# Patient Record
Sex: Female | Born: 1945 | Race: White | Hispanic: No | Smoking: Current every day smoker
Health system: Southern US, Community
[De-identification: ages and names within clinical notes are randomized; demographics above are authoritative.]

## PROBLEM LIST (undated history)

## (undated) ENCOUNTER — Ambulatory Visit: Admission: EM | Payer: Medicare Other

## (undated) DIAGNOSIS — L409 Psoriasis, unspecified: Secondary | ICD-10-CM

## (undated) DIAGNOSIS — Z86010 Personal history of colonic polyps: Secondary | ICD-10-CM

## (undated) DIAGNOSIS — Z860101 Personal history of adenomatous and serrated colon polyps: Secondary | ICD-10-CM

## (undated) DIAGNOSIS — R519 Headache, unspecified: Secondary | ICD-10-CM

## (undated) DIAGNOSIS — M1990 Unspecified osteoarthritis, unspecified site: Secondary | ICD-10-CM

## (undated) DIAGNOSIS — I714 Abdominal aortic aneurysm, without rupture, unspecified: Secondary | ICD-10-CM

## (undated) DIAGNOSIS — R51 Headache: Secondary | ICD-10-CM

## (undated) DIAGNOSIS — E559 Vitamin D deficiency, unspecified: Secondary | ICD-10-CM

## (undated) DIAGNOSIS — J449 Chronic obstructive pulmonary disease, unspecified: Secondary | ICD-10-CM

## (undated) DIAGNOSIS — E785 Hyperlipidemia, unspecified: Secondary | ICD-10-CM

## (undated) DIAGNOSIS — I5032 Chronic diastolic (congestive) heart failure: Secondary | ICD-10-CM

## (undated) DIAGNOSIS — I10 Essential (primary) hypertension: Secondary | ICD-10-CM

## (undated) DIAGNOSIS — M5416 Radiculopathy, lumbar region: Secondary | ICD-10-CM

## (undated) DIAGNOSIS — K219 Gastro-esophageal reflux disease without esophagitis: Secondary | ICD-10-CM

## (undated) HISTORY — PX: FOOT SURGERY: SHX648

---

## 1974-07-20 HISTORY — PX: OTHER SURGICAL HISTORY: SHX169

## 1998-07-20 HISTORY — PX: APPENDECTOMY: SHX54

## 2009-09-25 DIAGNOSIS — R519 Headache, unspecified: Secondary | ICD-10-CM | POA: Insufficient documentation

## 2009-09-25 DIAGNOSIS — I10 Essential (primary) hypertension: Secondary | ICD-10-CM | POA: Insufficient documentation

## 2009-09-25 DIAGNOSIS — L409 Psoriasis, unspecified: Secondary | ICD-10-CM | POA: Insufficient documentation

## 2009-09-25 DIAGNOSIS — F17200 Nicotine dependence, unspecified, uncomplicated: Secondary | ICD-10-CM | POA: Insufficient documentation

## 2009-09-25 DIAGNOSIS — M1990 Unspecified osteoarthritis, unspecified site: Secondary | ICD-10-CM | POA: Insufficient documentation

## 2009-10-11 DIAGNOSIS — E785 Hyperlipidemia, unspecified: Secondary | ICD-10-CM | POA: Insufficient documentation

## 2009-10-11 DIAGNOSIS — R6889 Other general symptoms and signs: Secondary | ICD-10-CM | POA: Insufficient documentation

## 2010-09-01 ENCOUNTER — Ambulatory Visit: Payer: Self-pay | Admitting: Family Medicine

## 2012-01-11 ENCOUNTER — Ambulatory Visit: Payer: Self-pay | Admitting: Family Medicine

## 2012-01-11 LAB — HM MAMMOGRAPHY

## 2013-08-21 LAB — HEMOGLOBIN A1C: Hgb A1c MFr Bld: 5.5 % (ref 4.0–6.0)

## 2013-12-21 ENCOUNTER — Ambulatory Visit: Payer: Self-pay | Admitting: Hematology and Oncology

## 2013-12-21 LAB — CBC CANCER CENTER
BASOS PCT: 0.3 %
Basophil #: 0 x10 3/mm (ref 0.0–0.1)
EOS ABS: 0.1 x10 3/mm (ref 0.0–0.7)
Eosinophil %: 1.7 %
HCT: 46.3 % (ref 35.0–47.0)
HGB: 16 g/dL (ref 12.0–16.0)
Lymphocyte #: 1 x10 3/mm (ref 1.0–3.6)
Lymphocyte %: 12.9 %
MCH: 31.9 pg (ref 26.0–34.0)
MCHC: 34.7 g/dL (ref 32.0–36.0)
MCV: 92 fL (ref 80–100)
MONOS PCT: 5 %
Monocyte #: 0.4 x10 3/mm (ref 0.2–0.9)
Neutrophil #: 6.1 x10 3/mm (ref 1.4–6.5)
Neutrophil %: 80.1 %
Platelet: 152 x10 3/mm (ref 150–440)
RBC: 5.03 10*6/uL (ref 3.80–5.20)
RDW: 13.3 % (ref 11.5–14.5)
WBC: 7.6 x10 3/mm (ref 3.6–11.0)

## 2013-12-26 ENCOUNTER — Ambulatory Visit: Payer: Self-pay | Admitting: Family Medicine

## 2014-01-17 ENCOUNTER — Ambulatory Visit: Payer: Self-pay | Admitting: Hematology and Oncology

## 2014-02-13 ENCOUNTER — Ambulatory Visit: Payer: Self-pay | Admitting: Specialist

## 2014-10-12 LAB — BASIC METABOLIC PANEL
BUN: 8 mg/dL (ref 4–21)
Creatinine: 0.9 mg/dL (ref 0.5–1.1)
GLUCOSE: 81 mg/dL
Potassium: 4.1 mmol/L (ref 3.4–5.3)
Sodium: 140 mmol/L (ref 137–147)

## 2014-10-12 LAB — HEPATIC FUNCTION PANEL
ALT: 25 U/L (ref 7–35)
AST: 31 U/L (ref 13–35)

## 2014-10-12 LAB — CBC AND DIFFERENTIAL
HEMATOCRIT: 49 % — AB (ref 36–46)
Hemoglobin: 16.7 g/dL — AB (ref 12.0–16.0)
PLATELETS: 201 10*3/uL (ref 150–399)
WBC: 6.6 10*3/mL

## 2014-10-12 LAB — LIPID PANEL
CHOLESTEROL: 193 mg/dL (ref 0–200)
HDL: 53 mg/dL (ref 35–70)
LDL Cholesterol: 116 mg/dL
TRIGLYCERIDES: 119 mg/dL (ref 40–160)

## 2015-01-10 ENCOUNTER — Other Ambulatory Visit: Payer: Self-pay | Admitting: Family Medicine

## 2015-01-11 NOTE — Telephone Encounter (Signed)
Advise patient the Tramadol can no longer be sent to the pharmacy electronically. Will need to come by the office to pick up the written prescription at the front desk.

## 2015-01-12 ENCOUNTER — Other Ambulatory Visit: Payer: Self-pay | Admitting: Family Medicine

## 2015-01-14 NOTE — Telephone Encounter (Signed)
Patient advised as directed below. Patient verbalized understanding.  

## 2015-02-05 ENCOUNTER — Other Ambulatory Visit: Payer: Self-pay | Admitting: Family Medicine

## 2015-02-06 ENCOUNTER — Telehealth: Payer: Self-pay

## 2015-02-06 NOTE — Telephone Encounter (Signed)
Advised pt that her Tramadol prescription is ready for her to pick up, and advised her that she is due for an annual exam before the year end. She verbalized fully understanding and stated that she was going to pick up the Tramadol prescription either today or tomorrow.

## 2015-02-06 NOTE — Telephone Encounter (Signed)
-----   Message from Margo Common, [place] sent at 02/05/2015  6:01 PM EDT ----- Regarding: Medication Refill Contact: [phone] Advise patient Tramadol prescription can't be sent electronically. Written prescription will be at the front desk for pick up and she is due for annual wellness exam before the end of the year.

## 2015-03-07 ENCOUNTER — Other Ambulatory Visit: Payer: Self-pay | Admitting: Family Medicine

## 2015-03-12 ENCOUNTER — Other Ambulatory Visit: Payer: Self-pay | Admitting: Family Medicine

## 2015-03-12 NOTE — Telephone Encounter (Signed)
Rx called in to pharmacy. 

## 2015-03-12 NOTE — Telephone Encounter (Signed)
Dennis patient 

## 2015-03-12 NOTE — Telephone Encounter (Signed)
Please call in tramadol.  

## 2015-04-07 ENCOUNTER — Other Ambulatory Visit: Payer: Self-pay | Admitting: Family Medicine

## 2015-04-07 DIAGNOSIS — M1990 Unspecified osteoarthritis, unspecified site: Secondary | ICD-10-CM

## 2015-04-08 NOTE — Telephone Encounter (Signed)
Ok to call in rx.  Thanks.  

## 2015-05-06 ENCOUNTER — Other Ambulatory Visit: Payer: Self-pay | Admitting: Family Medicine

## 2015-05-06 DIAGNOSIS — M1990 Unspecified osteoarthritis, unspecified site: Secondary | ICD-10-CM

## 2015-05-07 NOTE — Telephone Encounter (Signed)
Last OV 10/08/2014  Thanks,   -[name]

## 2015-05-07 NOTE — Telephone Encounter (Signed)
Printed, please fax or call in to pharmacy. Thank you.   

## 2015-06-06 ENCOUNTER — Other Ambulatory Visit: Payer: Self-pay | Admitting: Family Medicine

## 2015-06-06 DIAGNOSIS — M1990 Unspecified osteoarthritis, unspecified site: Secondary | ICD-10-CM

## 2015-06-06 NOTE — Telephone Encounter (Signed)
Printed, please fax or call in to pharmacy. Thank you.   

## 2015-06-18 DIAGNOSIS — L988 Other specified disorders of the skin and subcutaneous tissue: Secondary | ICD-10-CM | POA: Insufficient documentation

## 2015-06-18 DIAGNOSIS — K219 Gastro-esophageal reflux disease without esophagitis: Secondary | ICD-10-CM | POA: Insufficient documentation

## 2015-06-18 DIAGNOSIS — R739 Hyperglycemia, unspecified: Secondary | ICD-10-CM | POA: Insufficient documentation

## 2015-06-18 DIAGNOSIS — M79604 Pain in right leg: Secondary | ICD-10-CM | POA: Insufficient documentation

## 2015-06-18 DIAGNOSIS — R748 Abnormal levels of other serum enzymes: Secondary | ICD-10-CM | POA: Insufficient documentation

## 2015-06-18 DIAGNOSIS — R0789 Other chest pain: Secondary | ICD-10-CM | POA: Insufficient documentation

## 2015-06-18 DIAGNOSIS — E559 Vitamin D deficiency, unspecified: Secondary | ICD-10-CM | POA: Insufficient documentation

## 2015-06-18 DIAGNOSIS — J349 Unspecified disorder of nose and nasal sinuses: Secondary | ICD-10-CM | POA: Insufficient documentation

## 2015-06-18 DIAGNOSIS — G2581 Restless legs syndrome: Secondary | ICD-10-CM | POA: Insufficient documentation

## 2015-06-18 DIAGNOSIS — D582 Other hemoglobinopathies: Secondary | ICD-10-CM | POA: Insufficient documentation

## 2015-06-18 DIAGNOSIS — R5383 Other fatigue: Secondary | ICD-10-CM | POA: Insufficient documentation

## 2015-06-24 ENCOUNTER — Encounter: Payer: Self-pay | Admitting: Family Medicine

## 2015-06-24 ENCOUNTER — Ambulatory Visit (INDEPENDENT_AMBULATORY_CARE_PROVIDER_SITE_OTHER): Payer: PPO | Admitting: Family Medicine

## 2015-06-24 ENCOUNTER — Telehealth: Payer: Self-pay | Admitting: Family Medicine

## 2015-06-24 VITALS — BP 132/80 | HR 72 | Temp 97.4°F | Resp 16 | Ht 63.0 in | Wt 179.0 lb

## 2015-06-24 DIAGNOSIS — D692 Other nonthrombocytopenic purpura: Secondary | ICD-10-CM | POA: Diagnosis not present

## 2015-06-24 DIAGNOSIS — J449 Chronic obstructive pulmonary disease, unspecified: Secondary | ICD-10-CM | POA: Insufficient documentation

## 2015-06-24 DIAGNOSIS — M1990 Unspecified osteoarthritis, unspecified site: Secondary | ICD-10-CM

## 2015-06-24 DIAGNOSIS — I10 Essential (primary) hypertension: Secondary | ICD-10-CM | POA: Diagnosis not present

## 2015-06-24 DIAGNOSIS — Z23 Encounter for immunization: Secondary | ICD-10-CM

## 2015-06-24 DIAGNOSIS — J441 Chronic obstructive pulmonary disease with (acute) exacerbation: Secondary | ICD-10-CM | POA: Insufficient documentation

## 2015-06-24 DIAGNOSIS — Z13820 Encounter for screening for osteoporosis: Secondary | ICD-10-CM | POA: Diagnosis not present

## 2015-06-24 DIAGNOSIS — Z720 Tobacco use: Secondary | ICD-10-CM | POA: Insufficient documentation

## 2015-06-24 DIAGNOSIS — F17200 Nicotine dependence, unspecified, uncomplicated: Secondary | ICD-10-CM | POA: Diagnosis not present

## 2015-06-24 DIAGNOSIS — Z1239 Encounter for other screening for malignant neoplasm of breast: Secondary | ICD-10-CM

## 2015-06-24 DIAGNOSIS — R748 Abnormal levels of other serum enzymes: Secondary | ICD-10-CM | POA: Diagnosis not present

## 2015-06-24 DIAGNOSIS — Z1211 Encounter for screening for malignant neoplasm of colon: Secondary | ICD-10-CM | POA: Diagnosis not present

## 2015-06-24 DIAGNOSIS — Z0000 Encounter for general adult medical examination without abnormal findings: Secondary | ICD-10-CM

## 2015-06-24 DIAGNOSIS — L409 Psoriasis, unspecified: Secondary | ICD-10-CM

## 2015-06-24 MED ORDER — BETAMETHASONE DIPROPIONATE 0.05 % EX OINT: TOPICAL_OINTMENT | Freq: Two times a day (BID) | CUTANEOUS | Status: DC

## 2015-06-24 MED ORDER — GABAPENTIN 300 MG PO CAPS: 300.0000 mg | ORAL_CAPSULE | Freq: Two times a day (BID) | ORAL | Status: DC

## 2015-06-24 NOTE — Telephone Encounter (Signed)
Pt called saying she was in this after noon to see Dr. Venia Minks and talked to [name] about a handicap form being filled out.  She left the office without it.  She wants to know if Dr. Venia Minks could fill one out so she can come back and pik it up tomorrow.    Her call back is  [phone]  Thank sTeri

## 2015-06-24 NOTE — Progress Notes (Signed)
Patient ID: Mariah [name], female   DOB: 03/13/1946, 69 y.o.   MRN: [id number]         Patient: Mariah [name], Female    DOB: 1945/08/29, 69 y.o.   MRN: [id number] Visit Date: 06/24/2015  Today's Provider: Margarita Rana, MD   No chief complaint on file.  Subjective:    Annual wellness visit [name] is a 69 y.o. female. She feels fairly well.  Having trouble with right hip/leg pain.   She reports exercising. She reports she is sleeping well. She is due for several screening tests. Still smokes.     Also having a lot of trouble with arthritis and hip pain. It is impacting her ADLS.  Does have chronic pain. Some days worse than others.   Difficulty ambulating.  Psoriasis also worsening.   Has not seen rheumatologist, has see orthopedics.  Also, has abnormal labs that she did  Not follow up on. Takes her medication without any difficulty.       Review of Systems  Constitutional: Negative.   HENT: Positive for dental problem, postnasal drip and sinus pressure. Negative for congestion, drooling, ear discharge, ear pain, facial swelling, hearing loss, mouth sores, nosebleeds, rhinorrhea, sneezing, sore throat, tinnitus, trouble swallowing and voice change.   Eyes: Negative.   Respiratory: Negative.   Cardiovascular: Negative.   Gastrointestinal: Negative.   Endocrine: Negative.   Genitourinary: Negative.   Musculoskeletal: Negative.   Skin: Positive for rash (Pt has a history of Psorasis). Negative for color change, pallor and wound.  Allergic/Immunologic: Negative.   Neurological: Negative.   Hematological: Negative.   Psychiatric/Behavioral: Negative.     Social History   Social History  . Marital Status: Single    Spouse Name: N/A  . Number of Children: N/A  . Years of Education: N/A   Occupational History  . Not on file.   Social History Main Topics  . Smoking status: Current Every Day Smoker  . Smokeless tobacco: Never Used  . Alcohol Use: Yes  . Drug  Use: No  . Sexual Activity: Not on file   Other Topics Concern  . Not on file   Social History Narrative    Patient Active Problem List   Diagnosis Date Noted  . Chronic obstructive pulmonary disease ([place]) 06/24/2015  . Current tobacco use 06/24/2015  . Atypical chest pain 06/18/2015  . Abnormal serum level of alkaline phosphatase 06/18/2015  . Elevated hemoglobin ([place]) 06/18/2015  . Fatigue 06/18/2015  . Fistula 06/18/2015  . Blood glucose elevated 06/18/2015  . Gastro-esophageal reflux disease without esophagitis 06/18/2015  . Restless leg 06/18/2015  . Leg pain, right 06/18/2015  . 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 Avitaminosis D 06/18/2015  . Mechanical and motor problems with internal organs 10/11/2009  . Hypercholesteremia 10/11/2009  . Arthritis, degenerative 09/25/2009  . Essential (primary) hypertension 09/25/2009  . Cephalalgia 09/25/2009  . Psoriasis 09/25/2009  . Compulsive tobacco user syndrome 09/25/2009    Past Surgical History  Procedure Laterality Date  . Appendectomy  2000  . Foot surgery Bilateral   . 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    Previous Medications   ASPIRIN 81 MG TABLET       BETAMETHASONE DIPROPIONATE (DIPROLENE) 0.05 % OINTMENT    APPLY TWICE A DAY  CHOLECALCIFEROL (VITAMIN D) 2000 UNITS TABLET    VITAMIN D, 2000UNIT (Oral Tablet)  1 Every Day for 0 days  Quantity: 0.00;  Refills: 0   Ordered :19-Mar-2010  [name] ;  Started 11-Oct-2009 Active Comments: DX: 268.9   DIPHENHYDRAMINE (SOMINEX) 25 MG TABLET    Take by mouth.   HYDROCHLOROTHIAZIDE (HYDRODIURIL) 12.5 MG TABLET    Take by mouth.   MELOXICAM (MOBIC) 15 MG TABLET    Take 15 mg by mouth daily. with food   METOPROLOL SUCCINATE (TOPROL-XL) 50 MG 24 HR TABLET    Take by mouth.    PANTOPRAZOLE (PROTONIX) 20 MG TABLET    TAKE 1 TABLET BY MOUTH DAILY   PSYLLIUM 28.3 % POWD    Take by mouth.   SIMVASTATIN (ZOCOR) 10 MG TABLET    Take by mouth.   TRAMADOL (ULTRAM) 50 MG TABLET    TAKE 1 TABLET BY MOUTH 4 TIMES A DAY AS NEEDED    Patient Care Team: Margarita Rana, MD as PCP - General (Family Medicine)     Objective:   Vitals: BP 132/80 mmHg  Pulse 72  Temp(Src) 97.4 F (36.3 C) (Oral)  Resp 16  Ht 5\' 3"  (1.6 m)  Wt 179 lb (81.194 kg)  BMI 31.72 kg/m2  Physical Exam  Constitutional: She is oriented to person, place, and time. She appears well-developed and well-nourished.  HENT:  Head: Normocephalic and atraumatic.  Right Ear: Tympanic membrane, external ear and ear canal normal.  Left Ear: Tympanic membrane, external ear and ear canal normal.  Nose: Nose normal.  Mouth/Throat: Uvula is midline, oropharynx is clear and moist and mucous membranes are normal.  Eyes: Conjunctivae, EOM and lids are normal. Pupils are equal, round, and reactive to light. Lids are everted and swept, no foreign bodies found.  Neck: Trachea normal and normal range of motion. Carotid bruit is not present.  Cardiovascular: Normal rate, regular rhythm and normal heart sounds.   Pulmonary/Chest: Effort normal and breath sounds normal. Right breast exhibits no inverted nipple, no mass, no nipple discharge, no skin change and no tenderness. Left breast exhibits no inverted nipple, no mass, no nipple discharge, no skin change and no tenderness. Breasts are symmetrical.  Abdominal: Soft. Normal appearance, normal aorta and bowel sounds are normal. There is no tenderness.  Musculoskeletal: Normal range of motion.  Lymphadenopathy:    She has no cervical adenopathy.    She has no axillary adenopathy.  Neurological: She is alert and oriented to person, place, and time.  Skin: Skin is warm, dry and intact. Ecchymosis and rash noted. There is erythema.  Psychiatric: She has a normal mood and  affect. Her speech is normal and behavior is normal. Judgment and thought content normal. Cognition and memory are normal.    Activities of Daily Living In your present state of health, do you have any difficulty performing the following activities: 06/24/2015  Hearing? N  Vision? N  Difficulty concentrating or making decisions? N  Walking or climbing stairs? N  Dressing or bathing? N  Doing errands, shopping? N    Fall Risk Assessment Fall Risk  06/24/2015  Falls in the past year? No     Depression Screen PHQ 2/9 Scores 06/24/2015  PHQ - 2 Score 0    Cognitive Testing - 6-CIT  Correct? Score   What year is it? yes 0 0 or 4  What month is it? yes 0 0 or 3  Memorize:    Pia Mau,  [birth year],  [place],      What time is it? (within 1 hour) yes 0 0 or 3  Count backwards from 20 yes 0 0, 2, or 4  Name the months of the year yes 0 0, 2, or 4  Repeat name & address above yes 3 0, 2, 4, 6, 8, or 10       TOTAL SCORE  3/28   Interpretation:  Normal  Normal (0-7) Abnormal (8-28)       Assessment & Plan:     Annual Wellness Visit  Reviewed patient's Family Medical History Reviewed and updated list of patient's medical providers Assessment of cognitive impairment was done Assessed patient's functional ability Established a written schedule for health screening [place] Completed and Reviewed  Exercise Activities and Dietary recommendations Goals    None      Immunization History  Administered Date(s) Administered  . Pneumococcal Polysaccharide-23 03/26/2011  . Tdap 03/26/2011    Health Maintenance  Topic Date Due  . Hepatitis C Screening  01/17/1946  . COLONOSCOPY  08/22/1994  . ZOSTAVAX  08/22/2004  . DEXA SCAN  08/22/2009  . PNA vac Low Risk Adult (2 of 2 - PCV13) 03/25/2012  . MAMMOGRAM  01/10/2014  . INFLUENZA VACCINE  02/18/2015  . TETANUS/TDAP  03/25/2021      Discussed health benefits of physical activity, and  encouraged her to engage in regular exercise appropriate for her age and condition.   1. Medicare annual wellness visit, subsequent As above.    2. Need for vaccination with 13-polyvalent pneumococcal conjugate vaccine Given today.  - Pneumococcal conjugate vaccine 13-valent  3. Flu vaccine need Given today.  - Flu vaccine HIGH DOSE PF (Fluzone High dose)  4. Osteoporosis screening Will schedule.  - DG Bone Density; Future  5. Psoriasis Refill medication.  Rheumatology referral to assess if having symptoms of psoriatic arthritis.   - betamethasone dipropionate (DIPROLENE) 0.05 % ointment; Apply topically 2 (two) times daily.  Dispense: 45 g; Refill: 5 - Ambulatory referral to Rheumatology  6. Breast cancer screening Will call and schedule.   - MM Digital Screening; Future  7. 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    8. Elevated alkaline phosphatase level Recheck labs.  - Alkaline Phosphatase, Isoenzymes - Comprehensive metabolic panel  9. Compulsive tobacco user syndrome Increased hemoglobin. Will recheck.   - CBC with Differential/Platelet  10. Essential (primary) hypertension Will check labs.  - TSH  11. Colon cancer screening Will refer.   - Ambulatory referral to Gastroenterology  12. Osteoarthritis, unspecified osteoarthritis type, unspecified site Worsening. Will try adding medication to see if improves symptoms.    - gabapentin (NEURONTIN) 300 MG capsule; Take 1 capsule (300 mg total) by mouth 2 (two) times daily.  Dispense: 60 capsule; Refill: 3     Patient was seen and examined by Jerrell Belfast, MD, and note scribed by Ashley Royalty, CMA.  I have reviewed the document for accuracy and completeness and I agree with above. Jerrell Belfast, MD   Margarita Rana, MD   ------------------------------------------------------------------------------------------------------------

## 2015-06-24 NOTE — Telephone Encounter (Signed)
Signed and put in box. Thanks.

## 2015-06-25 ENCOUNTER — Encounter: Payer: Self-pay | Admitting: Family Medicine

## 2015-06-25 NOTE — Telephone Encounter (Signed)
Advised patient as below.  

## 2015-07-02 ENCOUNTER — Other Ambulatory Visit: Payer: Self-pay | Admitting: Family Medicine

## 2015-07-02 DIAGNOSIS — M1990 Unspecified osteoarthritis, unspecified site: Secondary | ICD-10-CM

## 2015-07-02 NOTE — Telephone Encounter (Signed)
Printed, please fax or call in to pharmacy. Thank you.   

## 2015-07-04 LAB — CBC WITH DIFFERENTIAL/PLATELET
BASOS: 0 %
Basophils Absolute: 0 10*3/uL (ref 0.0–0.2)
EOS (ABSOLUTE): 0.2 10*3/uL (ref 0.0–0.4)
Eos: 3 %
HEMOGLOBIN: 15.4 g/dL (ref 11.1–15.9)
Hematocrit: 44.6 % (ref 34.0–46.6)
IMMATURE GRANS (ABS): 0 10*3/uL (ref 0.0–0.1)
IMMATURE GRANULOCYTES: 0 %
LYMPHS ABS: 1.5 10*3/uL (ref 0.7–3.1)
LYMPHS: 22 %
MCH: 31.4 pg (ref 26.6–33.0)
MCHC: 34.5 g/dL (ref 31.5–35.7)
MCV: 91 fL (ref 79–97)
Monocytes Absolute: 0.4 10*3/uL (ref 0.1–0.9)
Monocytes: 6 %
NEUTROS ABS: 4.8 10*3/uL (ref 1.4–7.0)
Neutrophils: 69 %
Platelets: 191 10*3/uL (ref 150–379)
RBC: 4.91 x10E6/uL (ref 3.77–5.28)
RDW: 13 % (ref 12.3–15.4)
WBC: 6.9 10*3/uL (ref 3.4–10.8)

## 2015-07-04 LAB — COMPREHENSIVE METABOLIC PANEL
ALBUMIN: 4.3 g/dL (ref 3.5–4.8)
ALK PHOS: 119 IU/L — AB (ref 39–117)
ALT: 14 IU/L (ref 0–32)
AST: 23 IU/L (ref 0–40)
Albumin/Globulin Ratio: 2 (ref 1.1–2.5)
BILIRUBIN TOTAL: 0.8 mg/dL (ref 0.0–1.2)
BUN / CREAT RATIO: 10 — AB (ref 11–26)
BUN: 10 mg/dL (ref 8–27)
CO2: 24 mmol/L (ref 18–29)
CREATININE: 0.96 mg/dL (ref 0.57–1.00)
Calcium: 9.2 mg/dL (ref 8.7–10.3)
Chloride: 93 mmol/L — ABNORMAL LOW (ref 96–106)
GFR calc non Af Amer: 60 mL/min/{1.73_m2} (ref >59)
GFR, EST AFRICAN AMERICAN: 69 mL/min/{1.73_m2} (ref >59)
GLOBULIN, TOTAL: 2.1 g/dL (ref 1.5–4.5)
Glucose: 94 mg/dL (ref 65–99)
Potassium: 4.1 mmol/L (ref 3.5–5.2)
SODIUM: 136 mmol/L (ref 134–144)
TOTAL PROTEIN: 6.4 g/dL (ref 6.0–8.5)

## 2015-07-04 LAB — TSH: TSH: 2.64 u[IU]/mL (ref 0.450–4.500)

## 2015-07-04 LAB — ALKALINE PHOSPHATASE, ISOENZYMES
BONE FRACTION: 25 % (ref 14–68)
INTESTINAL FRAC.: 0 % (ref 0–18)
LIVER FRACTION: 75 % (ref 18–85)

## 2015-07-05 ENCOUNTER — Telehealth: Payer: Self-pay

## 2015-07-05 NOTE — Telephone Encounter (Signed)
-----   Message from Margarita Rana, MD sent at 07/04/2015  5:09 PM EST ----- Labs stable. Please notify patient.  Thanks.

## 2015-07-05 NOTE — Telephone Encounter (Signed)
Pt advised.   Thanks,   -Jerame Hedding  

## 2015-07-08 ENCOUNTER — Ambulatory Visit
Admission: RE | Admit: 2015-07-08 | Discharge: 2015-07-08 | Disposition: A | Discharge status: Home / Self Care | Payer: PPO | Source: Ambulatory Visit | Attending: Family Medicine | Admitting: Family Medicine

## 2015-07-08 DIAGNOSIS — Z780 Asymptomatic menopausal state: Secondary | ICD-10-CM | POA: Diagnosis not present

## 2015-07-08 DIAGNOSIS — Z13820 Encounter for screening for osteoporosis: Secondary | ICD-10-CM | POA: Diagnosis not present

## 2015-07-08 DIAGNOSIS — Z1231 Encounter for screening mammogram for malignant neoplasm of breast: Secondary | ICD-10-CM | POA: Insufficient documentation

## 2015-07-08 DIAGNOSIS — M810 Age-related osteoporosis without current pathological fracture: Secondary | ICD-10-CM | POA: Insufficient documentation

## 2015-07-08 DIAGNOSIS — Z1239 Encounter for other screening for malignant neoplasm of breast: Secondary | ICD-10-CM

## 2015-07-09 ENCOUNTER — Telehealth: Payer: Self-pay

## 2015-07-09 NOTE — Telephone Encounter (Signed)
-----   Message from Margarita Rana, MD sent at 07/09/2015  1:58 PM EST ----- Patient with osteoporosis. OV to discuss treatment options if patient would like to consider medication. Thanks.

## 2015-07-09 NOTE — Telephone Encounter (Signed)
LMTCB 07/09/2015  Thanks,   -[name]

## 2015-07-10 NOTE — Telephone Encounter (Signed)
LMTCB

## 2015-07-10 NOTE — Telephone Encounter (Signed)
Pt called to speak with [name] and would like a call back on her cell phone. Cell # is listed in contact info section of the message. Thanks TNP

## 2015-07-10 NOTE — Telephone Encounter (Signed)
Pt is returning call.  CB#[phone]/MW

## 2015-07-11 NOTE — Telephone Encounter (Signed)
Pt advised; apt made for 08/05/2015  Thanks,   -[name]

## 2015-08-05 ENCOUNTER — Ambulatory Visit (INDEPENDENT_AMBULATORY_CARE_PROVIDER_SITE_OTHER): Payer: PPO | Admitting: Family Medicine

## 2015-08-05 ENCOUNTER — Encounter: Payer: Self-pay | Admitting: Family Medicine

## 2015-08-05 VITALS — BP 122/80 | HR 64 | Temp 98.2°F | Resp 16 | Wt 182.0 lb

## 2015-08-05 DIAGNOSIS — K5900 Constipation, unspecified: Secondary | ICD-10-CM

## 2015-08-05 DIAGNOSIS — M1990 Unspecified osteoarthritis, unspecified site: Secondary | ICD-10-CM

## 2015-08-05 DIAGNOSIS — M810 Age-related osteoporosis without current pathological fracture: Secondary | ICD-10-CM

## 2015-08-05 MED ORDER — ALENDRONATE SODIUM 70 MG PO TABS: 70.0000 mg | ORAL_TABLET | ORAL | Status: DC

## 2015-08-05 MED ORDER — TRAMADOL HCL 50 MG PO TABS: ORAL_TABLET | ORAL | Status: DC

## 2015-08-05 MED ORDER — POLYETHYLENE GLYCOL 3350 17 GM/SCOOP PO POWD
17.0000 g | Freq: Two times a day (BID) | ORAL | Status: DC | PRN
Start: 2015-08-05 — End: 2020-05-26

## 2015-08-05 NOTE — Progress Notes (Signed)
Patient ID: Mariah [name], female   DOB: [birth year], 70 y.o.   MRN: [id number]         Patient: Mariah [name] Female    DOB: 07-14-1946   71 y.o.   MRN: [id number] Visit Date: 08/05/2015  Today's Provider: Margarita Rana, MD   No chief complaint on file.  Subjective:    HPI   Osteoporosis: Patient complains of osteoporosis. She was diagnosed with osteoporosis by bone density scan in 07/08/2015. 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   She is not currently being treated with calcium and vitamin D supplementation.  She is not currently being treated with bisphosphonates  Osteoporosis Risk Factors  Nonmodifiable Personal Hx of fracture as an adult: no Hx of fracture in first-degree relative: no Caucasian race: yes Advanced age: yes Female sex: yes Dementia: no Poor health/frailty: no  Potentially modifiable: Tobacco use: yes  Low body weight (<127 lbs): no Estrogen deficiency  early menopause (age <45) or bilateral ovariectomy: Yes  prolonged premenopausal amenorrhea (>1 yr): no Low calcium intake (lifelong): not applicable Alcoholism: no Recurrent falls: no Inadequate physical activity: no  Current calcium and Vit D intake: Dietary sources: Yes Supplements: Pt take 2,000 units of vitamin D a day.  Frax Score for hip is 9.3%; Major Fracture: 20%       No Known Allergies Previous Medications   ASPIRIN 81 MG TABLET       BETAMETHASONE DIPROPIONATE (DIPROLENE) 0.05 % OINTMENT    Apply topically 2 (two) times daily.   CHOLECALCIFEROL (VITAMIN D) 2000 UNITS TABLET    VITAMIN D, 2000UNIT (Oral Tablet)  1 Every Day for 0 days  Quantity: 0.00;  Refills: 0   Ordered :19-Mar-2010  [name] ;  Started 11-Oct-2009 Active Comments: DX: 268.9   DIPHENHYDRAMINE (SOMINEX) 25 MG TABLET    Take by mouth.   GABAPENTIN (NEURONTIN) 300 MG CAPSULE    Take 1 capsule (300 mg total) by mouth 2  (two) times daily.   HYDROCHLOROTHIAZIDE (HYDRODIURIL) 12.5 MG TABLET    Take by mouth.   MELOXICAM (MOBIC) 15 MG TABLET    Take 15 mg by mouth daily. with food   METOPROLOL SUCCINATE (TOPROL-XL) 50 MG 24 HR TABLET    Take by mouth.   PANTOPRAZOLE (PROTONIX) 20 MG TABLET    TAKE 1 TABLET BY MOUTH DAILY   PSYLLIUM 28.3 % POWD    Take by mouth.   SIMVASTATIN (ZOCOR) 10 MG TABLET    Take by mouth.    Review of Systems  Gastrointestinal: Positive for constipation. Negative for nausea, vomiting, abdominal pain, diarrhea, blood in stool, abdominal distention, anal bleeding and rectal pain.  Musculoskeletal: Negative.     Social History  Substance Use Topics  . Smoking status: Current Every Day Smoker  . Smokeless tobacco: Never Used  . Alcohol Use: Yes   Objective:   BP 122/80 mmHg  Pulse 64  Temp(Src) 98.2 F (36.8 C) (Oral)  Resp 16  Wt 182 lb (82.555 kg)  Physical Exam  Constitutional: She is oriented to person, place, and time. She appears well-developed and well-nourished.  Cardiovascular: Normal rate and regular rhythm.   Pulmonary/Chest: Effort normal and breath sounds normal.  Neurological: She is alert and oriented to person, place, and time.  Psychiatric: She has a normal mood and affect. Her behavior is normal. Judgment and thought content normal.      Assessment & Plan:  1. Osteoarthritis, unspecified osteoarthritis type, unspecified site Will refill medication.  Stable.  - traMADol (ULTRAM) 50 MG tablet; TAKE 1 TABLET 4 TIMES A DAY AS NEEDED  Dispense: 100 tablet; Refill: 5  2. Osteoporosis New problem.  Discussed risks and benefits of medication.  Will start medication.  Does not need dental implants. No kidney issues. Understands importance of how to take it. Recheck bone density in 2 years. Call if any side effects.   - alendronate (FOSAMAX) 70 MG tablet; Take 1 tablet (70 mg total) by mouth every 7 (seven) days. Take with a full glass of water on an empty  stomach.  Dispense: 12 tablet; Refill: 4  3. Constipation, unspecified constipation type New problem. Will try Miralax and see if improves.    - polyethylene glycol powder (GLYCOLAX/MIRALAX) powder; Take 17 g by mouth 2 (two) times daily as needed.  Dispense: 3350 g; Refill: 1  Patient was seen and examined by Jerrell Belfast, MD, and note scribed by Ashley Royalty, [place].  I have reviewed the document for accuracy and completeness and I agree with above. - Jerrell Belfast, MD     Margarita Rana, MD  [facility]

## 2015-08-12 DIAGNOSIS — M25559 Pain in unspecified hip: Secondary | ICD-10-CM | POA: Insufficient documentation

## 2015-08-12 DIAGNOSIS — Z716 Tobacco abuse counseling: Secondary | ICD-10-CM | POA: Diagnosis not present

## 2015-08-12 DIAGNOSIS — Z79899 Other long term (current) drug therapy: Secondary | ICD-10-CM | POA: Diagnosis not present

## 2015-08-12 DIAGNOSIS — M810 Age-related osteoporosis without current pathological fracture: Secondary | ICD-10-CM | POA: Insufficient documentation

## 2015-08-12 DIAGNOSIS — G8929 Other chronic pain: Secondary | ICD-10-CM | POA: Diagnosis not present

## 2015-08-12 DIAGNOSIS — M25551 Pain in right hip: Secondary | ICD-10-CM | POA: Diagnosis not present

## 2015-08-12 DIAGNOSIS — M2550 Pain in unspecified joint: Secondary | ICD-10-CM | POA: Diagnosis not present

## 2015-08-12 DIAGNOSIS — L409 Psoriasis, unspecified: Secondary | ICD-10-CM | POA: Diagnosis not present

## 2015-08-12 DIAGNOSIS — M5136 Other intervertebral disc degeneration, lumbar region: Secondary | ICD-10-CM | POA: Diagnosis not present

## 2015-08-12 DIAGNOSIS — R918 Other nonspecific abnormal finding of lung field: Secondary | ICD-10-CM | POA: Diagnosis not present

## 2015-08-12 DIAGNOSIS — M545 Low back pain: Secondary | ICD-10-CM | POA: Diagnosis not present

## 2015-08-12 DIAGNOSIS — M1611 Unilateral primary osteoarthritis, right hip: Secondary | ICD-10-CM | POA: Diagnosis not present

## 2015-08-12 DIAGNOSIS — J449 Chronic obstructive pulmonary disease, unspecified: Secondary | ICD-10-CM | POA: Diagnosis not present

## 2015-08-12 LAB — HM HEPATITIS C SCREENING LAB: HM Hepatitis Screen: NEGATIVE

## 2015-08-28 ENCOUNTER — Encounter: Payer: Self-pay | Admitting: Family Medicine

## 2015-09-30 ENCOUNTER — Other Ambulatory Visit: Payer: Self-pay | Admitting: Family Medicine

## 2015-10-07 DIAGNOSIS — L4050 Arthropathic psoriasis, unspecified: Secondary | ICD-10-CM | POA: Diagnosis not present

## 2015-10-07 DIAGNOSIS — M810 Age-related osteoporosis without current pathological fracture: Secondary | ICD-10-CM | POA: Diagnosis not present

## 2015-10-07 DIAGNOSIS — M169 Osteoarthritis of hip, unspecified: Secondary | ICD-10-CM | POA: Insufficient documentation

## 2015-10-07 DIAGNOSIS — M1611 Unilateral primary osteoarthritis, right hip: Secondary | ICD-10-CM | POA: Diagnosis not present

## 2015-10-07 DIAGNOSIS — L409 Psoriasis, unspecified: Secondary | ICD-10-CM | POA: Diagnosis not present

## 2015-10-24 DIAGNOSIS — M5416 Radiculopathy, lumbar region: Secondary | ICD-10-CM | POA: Diagnosis not present

## 2015-10-24 DIAGNOSIS — M1611 Unilateral primary osteoarthritis, right hip: Secondary | ICD-10-CM | POA: Diagnosis not present

## 2015-10-25 DIAGNOSIS — M5410 Radiculopathy, site unspecified: Secondary | ICD-10-CM | POA: Insufficient documentation

## 2015-11-06 ENCOUNTER — Other Ambulatory Visit: Payer: Self-pay | Admitting: Family Medicine

## 2015-11-11 ENCOUNTER — Ambulatory Visit (INDEPENDENT_AMBULATORY_CARE_PROVIDER_SITE_OTHER): Payer: PPO | Admitting: Family Medicine

## 2015-11-11 ENCOUNTER — Encounter: Payer: Self-pay | Admitting: Family Medicine

## 2015-11-11 VITALS — BP 116/76 | HR 88 | Temp 98.6°F | Resp 16 | Wt 185.0 lb

## 2015-11-11 DIAGNOSIS — R197 Diarrhea, unspecified: Secondary | ICD-10-CM

## 2015-11-11 NOTE — Progress Notes (Signed)
Patient ID: Mariah [name], female   DOB: Sep 10, 1945, 70 y.o.   MRN: [id number]         Patient: Mariah [name] Female    DOB: [birth year]   70 y.o.   MRN: [id number] Visit Date: 11/11/2015  Today's Provider: Margarita Rana, MD   Chief Complaint  Patient presents with  . Diarrhea   Subjective:    Diarrhea  This is a new problem. The current episode started 1 to 4 weeks ago. The patient states that diarrhea awakens (Only occasionally) her from sleep. Pertinent negatives include no abdominal pain, bloating, chills, coughing, fever, headaches, myalgias, vomiting or weight loss. Nothing aggravates the symptoms. There are no known risk factors. She has tried anti-motility drug for the symptoms. The treatment provided no relief.       No Known Allergies Previous Medications   ALENDRONATE (FOSAMAX) 70 MG TABLET    Take 1 tablet (70 mg total) by mouth every 7 (seven) days. Take with a full glass of water on an empty stomach.   ASPIRIN 81 MG TABLET       BETAMETHASONE DIPROPIONATE (DIPROLENE) 0.05 % OINTMENT    Apply topically 2 (two) times daily.   CHOLECALCIFEROL (VITAMIN D) 2000 UNITS TABLET    VITAMIN D, 2000UNIT (Oral Tablet)  1 Every Day for 0 days  Quantity: 0.00;  Refills: 0   Ordered :19-Mar-2010  Mariah [name] ;  Started 11-Oct-2009 Active Comments: DX: 268.9   DIPHENHYDRAMINE (SOMINEX) 25 MG TABLET    Take by mouth.   GABAPENTIN (NEURONTIN) 300 MG CAPSULE    TAKE 1 CAPSULE (300 MG TOTAL) BY MOUTH 2 (TWO) TIMES DAILY.   HYDROCHLOROTHIAZIDE (HYDRODIURIL) 12.5 MG TABLET    TAKE 1 TABLET EVERY DAY   MELOXICAM (MOBIC) 15 MG TABLET    Take 15 mg by mouth daily. with food   METOPROLOL SUCCINATE (TOPROL-XL) 50 MG 24 HR TABLET    TAKE 1 TABLET EVERY DAY   PANTOPRAZOLE (PROTONIX) 20 MG TABLET    TAKE 1 TABLET BY MOUTH DAILY   POLYETHYLENE GLYCOL POWDER (GLYCOLAX/MIRALAX) POWDER    Take 17 g by mouth 2 (two) times daily as needed.   PSYLLIUM 28.3 % POWD    Take by mouth.   SIMVASTATIN (ZOCOR) 10 MG TABLET    TAKE 1 TABLET AT BEDTIME   TRAMADOL (ULTRAM) 50 MG TABLET    TAKE 1 TABLET 4 TIMES A DAY AS NEEDED    Review of Systems  Constitutional: Negative.  Negative for fever, chills and weight loss.  Respiratory: Negative.  Negative for cough.   Cardiovascular: Negative.   Gastrointestinal: Positive for diarrhea. Negative for vomiting, abdominal pain and bloating.  Musculoskeletal: Negative.  Negative for myalgias.  Neurological: Negative for dizziness, light-headedness and headaches.    Social History  Substance Use Topics  . Smoking status: Current Every Day Smoker  . Smokeless tobacco: Never Used  . Alcohol Use: Yes   Objective:   BP 116/76 mmHg  Pulse 88  Temp(Src) 98.6 F (37 C) (Oral)  Resp 16  Wt 185 lb (83.915 kg)  Physical Exam  Constitutional: She is oriented to person, place, and time. She appears well-developed and well-nourished.  Cardiovascular: Normal rate, regular rhythm and normal heart sounds.   Pulmonary/Chest: Effort normal and breath sounds normal.  Abdominal: Soft. Bowel sounds are normal.  Neurological: She is alert and oriented to person, place, and time.  Skin: Skin is warm and dry.  Psychiatric: She has a normal mood and  affect. Her behavior is normal. Judgment and thought content normal.      Assessment & Plan:     1. Diarrhea, unspecified type New problem. Improved some today.  Going to start new medication for her psoriasis. Will check labs and stool cultures. Call if symptoms change.  - CBC with Differential/Platelet - Comprehensive metabolic panel - Stool C-Diff Toxin Assay - Stool Culture - Stool, WBC/Lactoferrin     Patient was seen and examined by Jerrell Belfast, MD, and note scribed by Ashley Royalty, CMA.   I have reviewed the document for accuracy and completeness and I agree with above. - Jerrell Belfast, MD   Margarita Rana, MD  [facility]

## 2015-11-12 DIAGNOSIS — R197 Diarrhea, unspecified: Secondary | ICD-10-CM | POA: Diagnosis not present

## 2015-11-13 ENCOUNTER — Telehealth: Payer: Self-pay

## 2015-11-13 DIAGNOSIS — R197 Diarrhea, unspecified: Secondary | ICD-10-CM | POA: Diagnosis not present

## 2015-11-13 LAB — COMPREHENSIVE METABOLIC PANEL
ALK PHOS: 130 IU/L — AB (ref 39–117)
ALT: 14 IU/L (ref 0–32)
AST: 21 IU/L (ref 0–40)
Albumin/Globulin Ratio: 2.1 (ref 1.2–2.2)
Albumin: 4.1 g/dL (ref 3.5–4.8)
BILIRUBIN TOTAL: 0.5 mg/dL (ref 0.0–1.2)
BUN/Creatinine Ratio: 8 — ABNORMAL LOW (ref 12–28)
BUN: 8 mg/dL (ref 8–27)
CHLORIDE: 97 mmol/L (ref 96–106)
CO2: 26 mmol/L (ref 18–29)
Calcium: 9 mg/dL (ref 8.7–10.3)
Creatinine, Ser: 0.98 mg/dL (ref 0.57–1.00)
GFR calc Af Amer: 67 mL/min/{1.73_m2} (ref >59)
GFR calc non Af Amer: 58 mL/min/{1.73_m2} — ABNORMAL LOW (ref >59)
GLUCOSE: 78 mg/dL (ref 65–99)
Globulin, Total: 2 g/dL (ref 1.5–4.5)
Potassium: 4 mmol/L (ref 3.5–5.2)
Sodium: 141 mmol/L (ref 134–144)
Total Protein: 6.1 g/dL (ref 6.0–8.5)

## 2015-11-13 LAB — CBC WITH DIFFERENTIAL/PLATELET
BASOS ABS: 0 10*3/uL (ref 0.0–0.2)
BASOS: 0 %
EOS (ABSOLUTE): 0.1 10*3/uL (ref 0.0–0.4)
Eos: 2 %
Hematocrit: 43.1 % (ref 34.0–46.6)
Hemoglobin: 14.3 g/dL (ref 11.1–15.9)
IMMATURE GRANS (ABS): 0 10*3/uL (ref 0.0–0.1)
Immature Granulocytes: 0 %
LYMPHS: 16 %
Lymphocytes Absolute: 1 10*3/uL (ref 0.7–3.1)
MCH: 30 pg (ref 26.6–33.0)
MCHC: 33.2 g/dL (ref 31.5–35.7)
MCV: 90 fL (ref 79–97)
MONOCYTES: 7 %
Monocytes Absolute: 0.4 10*3/uL (ref 0.1–0.9)
NEUTROS PCT: 75 %
Neutrophils Absolute: 4.5 10*3/uL (ref 1.4–7.0)
PLATELETS: 189 10*3/uL (ref 150–379)
RBC: 4.77 x10E6/uL (ref 3.77–5.28)
RDW: 13.3 % (ref 12.3–15.4)
WBC: 6 10*3/uL (ref 3.4–10.8)

## 2015-11-13 NOTE — Telephone Encounter (Signed)
Pt advised.  She says she is going to drop off the stool cultures today.  She reports that she feels the same.   Thanks,   -[name]

## 2015-11-13 NOTE — Telephone Encounter (Signed)
-----   Message from Margarita Rana, MD sent at 11/13/2015 11:00 AM EDT ----- Labs stable. Waiting on stool cultures. Please see how patient is doing. Thanks.

## 2015-11-15 LAB — CLOSTRIDIUM DIFFICILE EIA: C DIFFICILE TOXINS A+ B, EIA: NEGATIVE

## 2015-11-18 ENCOUNTER — Telehealth: Payer: Self-pay

## 2015-11-18 DIAGNOSIS — R197 Diarrhea, unspecified: Secondary | ICD-10-CM

## 2015-11-18 LAB — STOOL CULTURE: E coli, Shiga toxin Assay: NEGATIVE

## 2015-11-18 NOTE — Telephone Encounter (Signed)
Advised patient as below. Patient reports that she will consider GI referral. Order has been placed. Please schedule patient on a Monday afternoon if possible. Thanks!

## 2015-11-18 NOTE — Telephone Encounter (Signed)
Would probably wait on new medication until diarrhea improved even more .  Recommend GI referral to evaluate and treat if patient is agreeable. Thanks.

## 2015-11-18 NOTE — Telephone Encounter (Signed)
Advised patient as below. Patient reports that she is still having diarrhea. She reports that it may be slightly better, but she is still having symptoms. Patient also wanted to remind you that she still has not taken "the other medication" yet. She reports that we were going to wait until we received these results before starting.

## 2015-11-18 NOTE — Telephone Encounter (Signed)
Left message to call back  

## 2015-11-18 NOTE — Telephone Encounter (Signed)
-----   Message from Margarita Rana, MD sent at 11/18/2015  9:30 AM EDT ----- Stool cultures negative.  Please see how patient is doing. Thanks.

## 2015-11-19 LAB — FECAL LACTOFERRIN, QUANT: Lactoferrin, Fecal, Quant.: 4.09 ug/mL(g) (ref 0.00–7.24)

## 2015-11-25 ENCOUNTER — Other Ambulatory Visit (HOSPITAL_COMMUNITY): Payer: Self-pay | Admitting: Physician Assistant

## 2015-11-25 DIAGNOSIS — R197 Diarrhea, unspecified: Secondary | ICD-10-CM

## 2015-11-25 DIAGNOSIS — R748 Abnormal levels of other serum enzymes: Secondary | ICD-10-CM

## 2015-11-28 ENCOUNTER — Ambulatory Visit
Admission: RE | Admit: 2015-11-28 | Discharge: 2015-11-28 | Disposition: A | Discharge status: Home / Self Care | Payer: PPO | Source: Ambulatory Visit | Attending: Physician Assistant | Admitting: Physician Assistant

## 2015-11-28 DIAGNOSIS — R918 Other nonspecific abnormal finding of lung field: Secondary | ICD-10-CM | POA: Diagnosis not present

## 2015-11-28 DIAGNOSIS — R197 Diarrhea, unspecified: Secondary | ICD-10-CM | POA: Insufficient documentation

## 2015-11-28 DIAGNOSIS — K8020 Calculus of gallbladder without cholecystitis without obstruction: Secondary | ICD-10-CM | POA: Insufficient documentation

## 2015-11-28 DIAGNOSIS — I714 Abdominal aortic aneurysm, without rupture: Secondary | ICD-10-CM | POA: Diagnosis not present

## 2015-11-28 DIAGNOSIS — R748 Abnormal levels of other serum enzymes: Secondary | ICD-10-CM | POA: Insufficient documentation

## 2015-12-09 DIAGNOSIS — R748 Abnormal levels of other serum enzymes: Secondary | ICD-10-CM | POA: Diagnosis not present

## 2015-12-09 DIAGNOSIS — R938 Abnormal findings on diagnostic imaging of other specified body structures: Secondary | ICD-10-CM | POA: Diagnosis not present

## 2015-12-17 DIAGNOSIS — M167 Other unilateral secondary osteoarthritis of hip: Secondary | ICD-10-CM | POA: Diagnosis not present

## 2015-12-17 DIAGNOSIS — L4050 Arthropathic psoriasis, unspecified: Secondary | ICD-10-CM | POA: Diagnosis not present

## 2015-12-17 DIAGNOSIS — M25551 Pain in right hip: Secondary | ICD-10-CM | POA: Diagnosis not present

## 2015-12-30 DIAGNOSIS — Z79899 Other long term (current) drug therapy: Secondary | ICD-10-CM | POA: Diagnosis not present

## 2016-01-01 ENCOUNTER — Other Ambulatory Visit: Payer: Self-pay | Admitting: Family Medicine

## 2016-01-06 DIAGNOSIS — L409 Psoriasis, unspecified: Secondary | ICD-10-CM | POA: Diagnosis not present

## 2016-01-06 DIAGNOSIS — M1611 Unilateral primary osteoarthritis, right hip: Secondary | ICD-10-CM | POA: Diagnosis not present

## 2016-01-06 DIAGNOSIS — M810 Age-related osteoporosis without current pathological fracture: Secondary | ICD-10-CM | POA: Diagnosis not present

## 2016-01-06 DIAGNOSIS — L4050 Arthropathic psoriasis, unspecified: Secondary | ICD-10-CM | POA: Diagnosis not present

## 2016-01-06 DIAGNOSIS — Z79899 Other long term (current) drug therapy: Secondary | ICD-10-CM | POA: Diagnosis not present

## 2016-01-13 ENCOUNTER — Encounter: Payer: Self-pay | Admitting: Family Medicine

## 2016-01-13 ENCOUNTER — Ambulatory Visit (INDEPENDENT_AMBULATORY_CARE_PROVIDER_SITE_OTHER): Payer: PPO | Admitting: Family Medicine

## 2016-01-13 ENCOUNTER — Other Ambulatory Visit: Payer: Self-pay

## 2016-01-13 VITALS — BP 118/80 | HR 80 | Temp 97.8°F | Resp 16 | Wt 174.0 lb

## 2016-01-13 DIAGNOSIS — Q8909 Congenital malformations of spleen: Secondary | ICD-10-CM | POA: Insufficient documentation

## 2016-01-13 DIAGNOSIS — I714 Abdominal aortic aneurysm, without rupture, unspecified: Secondary | ICD-10-CM | POA: Insufficient documentation

## 2016-01-13 DIAGNOSIS — N2889 Other specified disorders of kidney and ureter: Secondary | ICD-10-CM

## 2016-01-13 DIAGNOSIS — I10 Essential (primary) hypertension: Secondary | ICD-10-CM

## 2016-01-13 NOTE — Assessment & Plan Note (Signed)
Needs renal protocol MRI or CT after surgery in July.

## 2016-01-13 NOTE — Progress Notes (Signed)
Patient: Mariah [name] Female    DOB: 1945-08-09   70 y.o.   MRN: [id number] Visit Date: 01/13/2016  Today's Provider: Margarita Rana, MD   Chief Complaint  Patient presents with  . Hypertension  . Pre-op Exam   Subjective:    HPI      Hypertension, follow-up:  BP Readings from Last 3 Encounters:  01/13/16 118/80  11/11/15 116/76  08/05/15 122/80    She was last seen for hypertension 6 months ago.  BP at that visit was 132/80. Management since that visit includes no changes. She reports excellent compliance with treatment. She is not having side effects.  She is not exercising. (Needs hip replacement.) She is adherent to low salt diet.   Outside blood pressures are not being checked. She is experiencing fatigue and lower extremity edema.  Patient denies chest pain, chest pressure/discomfort, claudication, dyspnea, exertional chest pressure/discomfort, irregular heart beat, near-syncope, orthopnea, palpitations and syncope.   Cardiovascular risk factors include advanced age (older than 50 for men, 70 for women), dyslipidemia, hypertension and smoking/ tobacco exposure.    Weight trend: stable Wt Readings from Last 3 Encounters:  01/13/16 174 lb (78.926 kg)  11/11/15 185 lb (83.915 kg)  08/05/15 182 lb (82.555 kg)    Current diet: in general, a "healthy" diet    ------------------------------------------------------------------------ Preoperative Exam Pt is having a right total hip replacement on 01/27/2016, performed by Dr. Marry Guan. Pt taking ASA 81 mg BID. Pt never had reaction to anesthesia.  Will get labs and EKG at pre-op.     No Known Allergies Current Meds  Medication Sig  . Apremilast (OTEZLA) 30 MG TABS Take 1 tablet by mouth 2 (two) times daily.  [name] aspirin 81 MG tablet   . betamethasone dipropionate (DIPROLENE) 0.05 % ointment Apply topically 2 (two) times daily.  . Cholecalciferol (VITAMIN D) 2000 UNITS tablet VITAMIN D, 2000UNIT (Oral  Tablet)  1 Every Day for 0 days  Quantity: 0.00;  Refills: 0   Ordered :19-Mar-2010  [name] ;  Started 11-Oct-2009 Active Comments: DX: 268.9  . colestipol (COLESTID) 1 g tablet Take 2 tablets by mouth 2 (two) times daily.  . diphenhydrAMINE (SOMINEX) 25 MG tablet Take by mouth.  . gabapentin (NEURONTIN) 300 MG capsule TAKE 1 CAPSULE (300 MG TOTAL) BY MOUTH 2 (TWO) TIMES DAILY.  . hydrochlorothiazide (HYDRODIURIL) 12.5 MG tablet TAKE 1 TABLET EVERY DAY  . meloxicam (MOBIC) 15 MG tablet Take 15 mg by mouth daily. with food  . metoprolol succinate (TOPROL-XL) 50 MG 24 hr tablet TAKE 1 TABLET EVERY DAY  . pantoprazole (PROTONIX) 20 MG tablet TAKE 1 TABLET BY MOUTH ONCE DAILY  . polyethylene glycol powder (GLYCOLAX/MIRALAX) powder Take 17 g by mouth 2 (two) times daily as needed.  . simvastatin (ZOCOR) 10 MG tablet TAKE 1 TABLET AT BEDTIME  . traMADol (ULTRAM) 50 MG tablet TAKE 1 TABLET 4 TIMES A DAY AS NEEDED    Review of Systems  Constitutional: Positive for fatigue. Negative for unexpected weight change (has lost weight, but due to liquid diet after having 4 teeth pulled).  HENT: Positive for rhinorrhea (AR).   Respiratory: Positive for cough (AR). Negative for shortness of breath and wheezing.   Cardiovascular: Positive for leg swelling (left ankle). Negative for chest pain and palpitations.    Social History  Substance Use Topics  . Smoking status: Current Every Day Smoker -- 1.00 packs/day for 0 years  . Smokeless tobacco: Never Used  .  Alcohol Use: Yes     Comment: occasionally   Objective:   BP 118/80 mmHg  Pulse 80  Temp(Src) 97.8 F (36.6 C) (Oral)  Resp 16  Wt 174 lb (78.926 kg)  Physical Exam  Constitutional: She is oriented to person, place, and time. She appears well-developed and well-nourished.  Neurological: She is alert and oriented to person, place, and time.  Psychiatric: She has a normal mood and affect. Her behavior is normal.      Assessment &  Plan:     1. Abdominal aortic aneurysm (AAA) without rupture ([place]) Found in ABD [place] on 11/28/2015. Will need 6 month FU [place] for this problem.  2. Renal mass Needs renal protocol MRI or CT after surgery in July. FU with Dr. Caryn Section in 2 months for further work up.Wishes to proceed with hip secondary to pain first.     3. Essential (primary) hypertension Stable. Continue current medications and plan of care.    Patient seen and examined by Jerrell Belfast, MD, and note scribed by Renaldo Fiddler, CMA.  I have reviewed the document for accuracy and completeness and I agree with above. - Jerrell Belfast, MD   Margarita Rana, MD  [facility]

## 2016-01-13 NOTE — Assessment & Plan Note (Addendum)
Found on ABD [place] on 11/28/2015. Will need 6 month FU in November, 2017.

## 2016-01-15 ENCOUNTER — Encounter
Admission: RE | Admit: 2016-01-15 | Discharge: 2016-01-15 | Disposition: A | Discharge status: Home / Self Care | Payer: PPO | Source: Ambulatory Visit | Attending: Orthopedic Surgery | Admitting: Orthopedic Surgery

## 2016-01-15 ENCOUNTER — Inpatient Hospital Stay: Admission: RE | Admit: 2016-01-15 | Payer: PPO | Source: Ambulatory Visit

## 2016-01-15 DIAGNOSIS — Z01810 Encounter for preprocedural cardiovascular examination: Secondary | ICD-10-CM | POA: Diagnosis not present

## 2016-01-15 DIAGNOSIS — Z01812 Encounter for preprocedural laboratory examination: Secondary | ICD-10-CM | POA: Insufficient documentation

## 2016-01-15 DIAGNOSIS — I10 Essential (primary) hypertension: Secondary | ICD-10-CM | POA: Diagnosis not present

## 2016-01-15 HISTORY — DX: Essential (primary) hypertension: I10

## 2016-01-15 HISTORY — DX: Unspecified osteoarthritis, unspecified site: M19.90

## 2016-01-15 LAB — CBC
HEMATOCRIT: 43.5 % (ref 35.0–47.0)
HEMOGLOBIN: 15.4 g/dL (ref 12.0–16.0)
MCH: 30.6 pg (ref 26.0–34.0)
MCHC: 35.4 g/dL (ref 32.0–36.0)
MCV: 86.6 fL (ref 80.0–100.0)
Platelets: 153 10*3/uL (ref 150–440)
RBC: 5.03 MIL/uL (ref 3.80–5.20)
RDW: 13.7 % (ref 11.5–14.5)
WBC: 5.7 10*3/uL (ref 3.6–11.0)

## 2016-01-15 LAB — URINALYSIS COMPLETE WITH MICROSCOPIC (ARMC ONLY)
BILIRUBIN URINE: NEGATIVE
Bacteria, UA: NONE SEEN
GLUCOSE, UA: NEGATIVE mg/dL
Hgb urine dipstick: NEGATIVE
KETONES UR: NEGATIVE mg/dL
Leukocytes, UA: NEGATIVE
Nitrite: NEGATIVE
Protein, ur: NEGATIVE mg/dL
RBC / HPF: NONE SEEN RBC/hpf (ref 0–5)
SPECIFIC GRAVITY, URINE: 1.002 — AB (ref 1.005–1.030)
pH: 7 (ref 5.0–8.0)

## 2016-01-15 LAB — SURGICAL PCR SCREEN
MRSA, PCR: NEGATIVE
Staphylococcus aureus: NEGATIVE

## 2016-01-15 LAB — COMPREHENSIVE METABOLIC PANEL
ALK PHOS: 124 U/L (ref 38–126)
ALT: 18 U/L (ref 14–54)
AST: 26 U/L (ref 15–41)
Albumin: 4.4 g/dL (ref 3.5–5.0)
Anion gap: 9 (ref 5–15)
BUN: 8 mg/dL (ref 6–20)
CALCIUM: 9.5 mg/dL (ref 8.9–10.3)
CO2: 29 mmol/L (ref 22–32)
Chloride: 97 mmol/L — ABNORMAL LOW (ref 101–111)
Creatinine, Ser: 0.86 mg/dL (ref 0.44–1.00)
GFR calc Af Amer: >60 mL/min (ref >60)
GFR calc non Af Amer: >60 mL/min (ref >60)
GLUCOSE: 103 mg/dL — AB (ref 65–99)
Potassium: 3.7 mmol/L (ref 3.5–5.1)
SODIUM: 135 mmol/L (ref 135–145)
Total Bilirubin: 0.9 mg/dL (ref 0.3–1.2)
Total Protein: 7.4 g/dL (ref 6.5–8.1)

## 2016-01-15 LAB — PROTIME-INR
INR: 0.91
Prothrombin Time: 12.5 seconds (ref 11.4–15.0)

## 2016-01-15 LAB — APTT: aPTT: 30 seconds (ref 24–36)

## 2016-01-15 LAB — SEDIMENTATION RATE: Sed Rate: 5 mm/hr (ref 0–30)

## 2016-01-15 NOTE — Pre-Procedure Instructions (Signed)
Spoke with pharmacist Nicki Reaper here at [facility] regarding pt's [place] Nail and see if she needs to stop it prior to hip replacement.  Nicki Reaper researched this and reports pt does not need to stop this med prior to hip replacement.

## 2016-01-15 NOTE — Pre-Procedure Instructions (Signed)
MEDICAL CLEARANCE REQUEST/ EKG, AS INSTRUCTED BY DR Yuma FAXED TO [place]

## 2016-01-15 NOTE — Patient Instructions (Signed)
  Your procedure is scheduled on: Monday January 27, 2016. Report to Same Day Surgery. To find out your arrival time please call [phone] between 1PM - 3PM on Friday January 24, 2016 .  Remember: Instructions that are not followed completely may result in serious medical risk, up to and including death, or upon the discretion of your surgeon and anesthesiologist your surgery may need to be rescheduled.    _x___ 1. Do not eat food or drink liquids after midnight. No gum chewing or hard candies.     _x___ 2. No Alcohol for 24 hours before or after surgery.   ____ 3. Bring all medications with you on the day of surgery if instructed.    __x__ 4. Notify your doctor if there is any change in your medical condition     (cold, fever, infections).     Do not wear jewelry, make-up, hairpins, clips or nail polish.  Do not wear lotions, powders, or perfumes. You may wear deodorant.  Do not shave 48 hours prior to surgery. Men may shave face and neck.  Do not bring valuables to the hospital.    [facility] is not responsible for any belongings or valuables.               Contacts, dentures or bridgework may not be worn into surgery.  Leave your suitcase in the car. After surgery it may be brought to your room.  For patients admitted to the hospital, discharge time is determined by your treatment team.   Patients discharged the day of surgery will not be allowed to drive home.    Please read over the following fact sheets that you were given:   [facility] Preparing for Surgery  __x__ Take these medicines the morning of surgery with A SIP OF WATER:    1. metoprolol succinate (TOPROL-XL)  2. pantoprazole (PROTONIX) take an extra dose at bedtime on 01/26/16  3. gabapentin (NEURONTIN)   ____ Fleet Enema (as directed)   __x__ Use CHG Soap as directed on instruction sheet  ____ Use inhalers on the day of surgery and bring to hospital day of surgery  ____ Stop metformin 2 days prior to  surgery    ____ Take 1/2 of usual insulin dose the night before surgery and none on the morning of surgery.   __x__ Stop aspirin 7 days prior to surgery per Dr. Marry Guan.  _x_ Stop Anti-inflammatories such as: meloxicam (MOBIC),  Advil, Aleve, Ibuprofen, Motrin, Naproxen,  Naprosyn, Goodies powders or aspirin products.   _x_ Stop supplements:Cholecalciferol (VITAMIN D3)  until after surgery.    ____ Bring C-Pap to the hospital.

## 2016-01-16 LAB — URINE CULTURE: Special Requests: NORMAL

## 2016-01-16 LAB — TYPE AND SCREEN
ABO/RH(D): B NEG
ANTIBODY SCREEN: NEGATIVE

## 2016-01-22 ENCOUNTER — Other Ambulatory Visit: Payer: Self-pay | Admitting: Family Medicine

## 2016-01-22 DIAGNOSIS — R9431 Abnormal electrocardiogram [ECG] [EKG]: Secondary | ICD-10-CM

## 2016-01-22 DIAGNOSIS — Z01810 Encounter for preprocedural cardiovascular examination: Secondary | ICD-10-CM

## 2016-01-22 NOTE — Progress Notes (Signed)
Patient advised as below.  

## 2016-01-22 NOTE — Progress Notes (Unsigned)
Please advise patient she needs referral to cardiology for pre-op evaluation due to EKG changes. Referral has been sent to [facility]. Her hip surgery is scheduled for the 10th, but will likely need to be postpones until her cardiology evaluation is complete.

## 2016-01-24 NOTE — Pre-Procedure Instructions (Signed)
Mariah [name] at [facility] office regarding clearance.

## 2016-01-27 ENCOUNTER — Encounter: Payer: Self-pay | Admitting: *Deleted

## 2016-01-27 ENCOUNTER — Inpatient Hospital Stay
Admission: RE | Admit: 2016-01-27 | Discharge: 2016-01-29 | DRG: 470 | Disposition: A | Discharge status: Skilled Nursing Facility | Payer: PPO | Source: Ambulatory Visit | Attending: Orthopedic Surgery | Admitting: Orthopedic Surgery

## 2016-01-27 ENCOUNTER — Inpatient Hospital Stay: Payer: PPO | Admitting: Anesthesiology

## 2016-01-27 ENCOUNTER — Encounter
Admission: RE | Disposition: A | Discharge status: Skilled Nursing Facility | Payer: Self-pay | Source: Ambulatory Visit | Attending: Orthopedic Surgery

## 2016-01-27 ENCOUNTER — Inpatient Hospital Stay: Payer: PPO

## 2016-01-27 DIAGNOSIS — Z96641 Presence of right artificial hip joint: Secondary | ICD-10-CM | POA: Diagnosis not present

## 2016-01-27 DIAGNOSIS — K219 Gastro-esophageal reflux disease without esophagitis: Secondary | ICD-10-CM | POA: Diagnosis present

## 2016-01-27 DIAGNOSIS — M1993 Secondary osteoarthritis, unspecified site: Secondary | ICD-10-CM | POA: Diagnosis not present

## 2016-01-27 DIAGNOSIS — I10 Essential (primary) hypertension: Secondary | ICD-10-CM | POA: Diagnosis not present

## 2016-01-27 DIAGNOSIS — M810 Age-related osteoporosis without current pathological fracture: Secondary | ICD-10-CM | POA: Diagnosis present

## 2016-01-27 DIAGNOSIS — L4050 Arthropathic psoriasis, unspecified: Secondary | ICD-10-CM | POA: Diagnosis not present

## 2016-01-27 DIAGNOSIS — I739 Peripheral vascular disease, unspecified: Secondary | ICD-10-CM | POA: Diagnosis present

## 2016-01-27 DIAGNOSIS — J449 Chronic obstructive pulmonary disease, unspecified: Secondary | ICD-10-CM | POA: Diagnosis not present

## 2016-01-27 DIAGNOSIS — F17200 Nicotine dependence, unspecified, uncomplicated: Secondary | ICD-10-CM | POA: Diagnosis not present

## 2016-01-27 DIAGNOSIS — M25551 Pain in right hip: Secondary | ICD-10-CM | POA: Diagnosis not present

## 2016-01-27 DIAGNOSIS — M1611 Unilateral primary osteoarthritis, right hip: Secondary | ICD-10-CM | POA: Diagnosis not present

## 2016-01-27 DIAGNOSIS — Z96649 Presence of unspecified artificial hip joint: Secondary | ICD-10-CM

## 2016-01-27 DIAGNOSIS — M167 Other unilateral secondary osteoarthritis of hip: Secondary | ICD-10-CM | POA: Diagnosis not present

## 2016-01-27 DIAGNOSIS — L4059 Other psoriatic arthropathy: Secondary | ICD-10-CM | POA: Diagnosis not present

## 2016-01-27 DIAGNOSIS — Z471 Aftercare following joint replacement surgery: Secondary | ICD-10-CM | POA: Diagnosis not present

## 2016-01-27 HISTORY — PX: JOINT REPLACEMENT: SHX530

## 2016-01-27 HISTORY — PX: TOTAL HIP ARTHROPLASTY: SHX124

## 2016-01-27 SURGERY — ARTHROPLASTY, HIP, TOTAL,POSTERIOR APPROACH
Anesthesia: Spinal | Site: Hip | Laterality: Right | Wound class: Clean

## 2016-01-27 MED ORDER — APREMILAST 30 MG PO TABS
1.0000 | ORAL_TABLET | Freq: Two times a day (BID) | ORAL | Status: DC
  Administered 2016-01-27 – 2016-01-28 (×2): 1 via ORAL
  Filled 2016-01-27: qty 1

## 2016-01-27 MED ORDER — CEFAZOLIN SODIUM-DEXTROSE 2-4 GM/100ML-% IV SOLN
INTRAVENOUS | Status: AC
  Filled 2016-01-27: qty 100

## 2016-01-27 MED ORDER — BUPIVACAINE IN DEXTROSE 0.75-8.25 % IT SOLN
INTRATHECAL | Status: DC | PRN
  Administered 2016-01-27: 1.8 mL via INTRATHECAL

## 2016-01-27 MED ORDER — BISACODYL 10 MG RE SUPP: 10.0000 mg | Freq: Every day | RECTAL | Status: DC | PRN

## 2016-01-27 MED ORDER — SODIUM CHLORIDE 0.9 % IV SOLN
1000.0000 mg | Freq: Once | INTRAVENOUS | Status: AC
  Administered 2016-01-27: 1000 mg via INTRAVENOUS
  Filled 2016-01-27: qty 10

## 2016-01-27 MED ORDER — TRIAMCINOLONE ACETONIDE 0.5 % EX OINT
TOPICAL_OINTMENT | Freq: Two times a day (BID) | CUTANEOUS | Status: DC
  Administered 2016-01-27 – 2016-01-28 (×2): via TOPICAL
  Filled 2016-01-27: qty 15

## 2016-01-27 MED ORDER — SENNOSIDES-DOCUSATE SODIUM 8.6-50 MG PO TABS
1.0000 | ORAL_TABLET | Freq: Two times a day (BID) | ORAL | Status: DC
  Administered 2016-01-27 – 2016-01-28 (×3): 1 via ORAL
  Filled 2016-01-27 (×3): qty 1

## 2016-01-27 MED ORDER — CEFAZOLIN SODIUM-DEXTROSE 2-4 GM/100ML-% IV SOLN
2.0000 g | INTRAVENOUS | Status: AC
  Administered 2016-01-27: 2 g via INTRAVENOUS

## 2016-01-27 MED ORDER — MENTHOL 3 MG MT LOZG
1.0000 | LOZENGE | OROMUCOSAL | Status: DC | PRN
  Filled 2016-01-27: qty 9

## 2016-01-27 MED ORDER — PHENYLEPHRINE HCL 10 MG/ML IJ SOLN
INTRAMUSCULAR | Status: DC | PRN
  Administered 2016-01-27: 50 ug via INTRAVENOUS
  Administered 2016-01-27 (×2): 200 ug via INTRAVENOUS
  Administered 2016-01-27: 50 ug via INTRAVENOUS
  Administered 2016-01-27: 100 ug via INTRAVENOUS

## 2016-01-27 MED ORDER — ACETAMINOPHEN 325 MG PO TABS: 650.0000 mg | ORAL_TABLET | Freq: Four times a day (QID) | ORAL | Status: DC | PRN

## 2016-01-27 MED ORDER — MORPHINE SULFATE (PF) 2 MG/ML IV SOLN
2.0000 mg | INTRAVENOUS | Status: DC | PRN
  Administered 2016-01-27: 2 mg via INTRAVENOUS
  Filled 2016-01-27: qty 1

## 2016-01-27 MED ORDER — FLEET ENEMA 7-19 GM/118ML RE ENEM: 1.0000 | ENEMA | Freq: Once | RECTAL | Status: DC | PRN

## 2016-01-27 MED ORDER — ONDANSETRON HCL 4 MG/2ML IJ SOLN
4.0000 mg | Freq: Four times a day (QID) | INTRAMUSCULAR | Status: DC | PRN
  Administered 2016-01-27 – 2016-01-28 (×2): 4 mg via INTRAVENOUS
  Filled 2016-01-27 (×2): qty 2

## 2016-01-27 MED ORDER — NICOTINE 21 MG/24HR TD PT24
21.0000 mg | MEDICATED_PATCH | Freq: Every day | TRANSDERMAL | Status: DC
  Administered 2016-01-27 – 2016-01-29 (×3): 21 mg via TRANSDERMAL
  Filled 2016-01-27 (×3): qty 1

## 2016-01-27 MED ORDER — ACETAMINOPHEN 10 MG/ML IV SOLN
INTRAVENOUS | Status: DC | PRN
  Administered 2016-01-27: 1000 mg via INTRAVENOUS

## 2016-01-27 MED ORDER — POLYETHYLENE GLYCOL 3350 17 GM/SCOOP PO POWD
17.0000 g | Freq: Two times a day (BID) | ORAL | Status: DC | PRN
  Filled 2016-01-27: qty 255

## 2016-01-27 MED ORDER — OXYCODONE HCL 5 MG PO TABS
5.0000 mg | ORAL_TABLET | ORAL | Status: DC | PRN
  Administered 2016-01-27 (×2): 5 mg via ORAL
  Administered 2016-01-27 – 2016-01-29 (×7): 10 mg via ORAL
  Administered 2016-01-29: 5 mg via ORAL
  Filled 2016-01-27: qty 1
  Filled 2016-01-27: qty 2
  Filled 2016-01-27: qty 1
  Filled 2016-01-27 (×7): qty 2

## 2016-01-27 MED ORDER — ACETAMINOPHEN 650 MG RE SUPP: 650.0000 mg | Freq: Four times a day (QID) | RECTAL | Status: DC | PRN

## 2016-01-27 MED ORDER — NEOMYCIN-POLYMYXIN B GU 40-200000 IR SOLN
Status: DC | PRN
  Administered 2016-01-27: 2 mL

## 2016-01-27 MED ORDER — CHLORHEXIDINE GLUCONATE 4 % EX LIQD: 60.0000 mL | Freq: Once | CUTANEOUS | Status: DC

## 2016-01-27 MED ORDER — METOPROLOL SUCCINATE ER 50 MG PO TB24
50.0000 mg | ORAL_TABLET | Freq: Every day | ORAL | Status: DC
  Administered 2016-01-28 – 2016-01-29 (×2): 50 mg via ORAL
  Filled 2016-01-27 (×2): qty 1

## 2016-01-27 MED ORDER — SODIUM CHLORIDE 0.9 % IV SOLN
10000.0000 ug | INTRAVENOUS | Status: DC | PRN
  Administered 2016-01-27: 20 ug/min via INTRAVENOUS

## 2016-01-27 MED ORDER — SIMVASTATIN 20 MG PO TABS
10.0000 mg | ORAL_TABLET | Freq: Every day | ORAL | Status: DC
  Administered 2016-01-27 – 2016-01-28 (×2): 10 mg via ORAL
  Filled 2016-01-27 (×2): qty 1

## 2016-01-27 MED ORDER — TETRACAINE HCL 1 % IJ SOLN
INTRAMUSCULAR | Status: DC | PRN
  Administered 2016-01-27: .8 mg via INTRASPINAL

## 2016-01-27 MED ORDER — KETAMINE HCL 100 MG/ML IJ SOLN
250.0000 mg | INTRAMUSCULAR | Status: DC | PRN
  Administered 2016-01-27: 4 ug/kg/min via INTRAVENOUS

## 2016-01-27 MED ORDER — TRANEXAMIC ACID 1000 MG/10ML IV SOLN
1000.0000 mg | INTRAVENOUS | Status: AC
  Administered 2016-01-27: 1000 mg via INTRAVENOUS
  Filled 2016-01-27: qty 10

## 2016-01-27 MED ORDER — SODIUM CHLORIDE 0.9 % IV SOLN
INTRAVENOUS | Status: DC
  Administered 2016-01-27 – 2016-01-28 (×2): via INTRAVENOUS

## 2016-01-27 MED ORDER — VITAMIN D3 50 MCG (2000 UT) PO TABS: 2000.0000 [IU] | ORAL_TABLET | Freq: Every day | ORAL | Status: DC

## 2016-01-27 MED ORDER — TRAMADOL HCL 50 MG PO TABS
50.0000 mg | ORAL_TABLET | ORAL | Status: DC | PRN
  Administered 2016-01-27 – 2016-01-28 (×3): 100 mg via ORAL
  Filled 2016-01-27 (×3): qty 2

## 2016-01-27 MED ORDER — ONDANSETRON HCL 4 MG/2ML IJ SOLN: 4.0000 mg | Freq: Once | INTRAMUSCULAR | Status: DC | PRN

## 2016-01-27 MED ORDER — ONDANSETRON HCL 4 MG PO TABS: 4.0000 mg | ORAL_TABLET | Freq: Four times a day (QID) | ORAL | Status: DC | PRN

## 2016-01-27 MED ORDER — VITAMIN D 1000 UNITS PO TABS
2000.0000 [IU] | ORAL_TABLET | Freq: Every day | ORAL | Status: DC
  Administered 2016-01-28 – 2016-01-29 (×2): 2000 [IU] via ORAL
  Filled 2016-01-27 (×2): qty 2

## 2016-01-27 MED ORDER — PHENOL 1.4 % MT LIQD
1.0000 | OROMUCOSAL | Status: DC | PRN
Start: 2016-01-27 — End: 2016-01-29
  Filled 2016-01-27: qty 177

## 2016-01-27 MED ORDER — ACETAMINOPHEN 10 MG/ML IV SOLN
1000.0000 mg | Freq: Four times a day (QID) | INTRAVENOUS | Status: AC
  Administered 2016-01-27 (×3): 1000 mg via INTRAVENOUS
  Filled 2016-01-27 (×4): qty 100

## 2016-01-27 MED ORDER — MIDAZOLAM HCL 5 MG/5ML IJ SOLN
INTRAMUSCULAR | Status: DC | PRN
  Administered 2016-01-27: 2 mg via INTRAVENOUS

## 2016-01-27 MED ORDER — MAGNESIUM HYDROXIDE 400 MG/5ML PO SUSP
30.0000 mL | Freq: Every day | ORAL | Status: DC | PRN
  Administered 2016-01-28: 30 mL via ORAL
  Filled 2016-01-27: qty 30

## 2016-01-27 MED ORDER — DIPHENHYDRAMINE HCL 12.5 MG/5ML PO ELIX: 12.5000 mg | ORAL_SOLUTION | ORAL | Status: DC | PRN

## 2016-01-27 MED ORDER — HYDROCHLOROTHIAZIDE 25 MG PO TABS
12.5000 mg | ORAL_TABLET | Freq: Every day | ORAL | Status: DC
  Administered 2016-01-28 – 2016-01-29 (×2): 12.5 mg via ORAL
  Filled 2016-01-27 (×3): qty 1

## 2016-01-27 MED ORDER — ENOXAPARIN SODIUM 30 MG/0.3ML ~~LOC~~ SOLN
30.0000 mg | Freq: Two times a day (BID) | SUBCUTANEOUS | Status: DC
  Administered 2016-01-28 – 2016-01-29 (×3): 30 mg via SUBCUTANEOUS
  Filled 2016-01-27 (×3): qty 0.3

## 2016-01-27 MED ORDER — FENTANYL CITRATE (PF) 100 MCG/2ML IJ SOLN
INTRAMUSCULAR | Status: DC | PRN
  Administered 2016-01-27 (×2): 50 ug via INTRAVENOUS

## 2016-01-27 MED ORDER — COLESTIPOL HCL 1 G PO TABS
2.0000 g | ORAL_TABLET | Freq: Every day | ORAL | Status: DC
  Administered 2016-01-28 – 2016-01-29 (×2): 2 g via ORAL
  Filled 2016-01-27 (×4): qty 2

## 2016-01-27 MED ORDER — VASOPRESSIN 20 UNIT/ML IV SOLN
INTRAVENOUS | Status: DC | PRN
  Administered 2016-01-27: 2 [IU] via INTRAVENOUS

## 2016-01-27 MED ORDER — LACTATED RINGERS IV SOLN
INTRAVENOUS | Status: DC
  Administered 2016-01-27 (×2): via INTRAVENOUS

## 2016-01-27 MED ORDER — ACETAMINOPHEN 10 MG/ML IV SOLN
INTRAVENOUS | Status: AC
  Filled 2016-01-27: qty 100

## 2016-01-27 MED ORDER — CEFAZOLIN SODIUM-DEXTROSE 2-4 GM/100ML-% IV SOLN
2.0000 g | Freq: Four times a day (QID) | INTRAVENOUS | Status: AC
  Administered 2016-01-27 – 2016-01-28 (×4): 2 g via INTRAVENOUS
  Filled 2016-01-27 (×4): qty 100

## 2016-01-27 MED ORDER — METOCLOPRAMIDE HCL 10 MG PO TABS
10.0000 mg | ORAL_TABLET | Freq: Three times a day (TID) | ORAL | Status: AC
  Administered 2016-01-27 – 2016-01-29 (×8): 10 mg via ORAL
  Filled 2016-01-27 (×8): qty 1

## 2016-01-27 MED ORDER — FERROUS SULFATE 325 (65 FE) MG PO TABS
325.0000 mg | ORAL_TABLET | Freq: Two times a day (BID) | ORAL | Status: DC
  Administered 2016-01-27 – 2016-01-29 (×4): 325 mg via ORAL
  Filled 2016-01-27 (×4): qty 1

## 2016-01-27 MED ORDER — NEOMYCIN-POLYMYXIN B GU 40-200000 IR SOLN
Status: AC
  Filled 2016-01-27: qty 20

## 2016-01-27 MED ORDER — FENTANYL CITRATE (PF) 100 MCG/2ML IJ SOLN: 25.0000 ug | INTRAMUSCULAR | Status: DC | PRN

## 2016-01-27 MED ORDER — ALUM & MAG HYDROXIDE-SIMETH 200-200-20 MG/5ML PO SUSP: 30.0000 mL | ORAL | Status: DC | PRN

## 2016-01-27 MED ORDER — GABAPENTIN 100 MG PO CAPS
200.0000 mg | ORAL_CAPSULE | Freq: Two times a day (BID) | ORAL | Status: DC
  Administered 2016-01-27 – 2016-01-29 (×4): 200 mg via ORAL
  Filled 2016-01-27 (×4): qty 2

## 2016-01-27 MED ORDER — PROPOFOL 500 MG/50ML IV EMUL
INTRAVENOUS | Status: DC | PRN
  Administered 2016-01-27: 40 ug/kg/min via INTRAVENOUS

## 2016-01-27 SURGICAL SUPPLY — 51 items
BLADE DRUM FLTD (BLADE) ×2 IMPLANT
BLADE SAW 1 (BLADE) ×2 IMPLANT
CANISTER SUCT 1200ML W/VALVE (MISCELLANEOUS) ×2 IMPLANT
CANISTER SUCT 3000ML (MISCELLANEOUS) ×4 IMPLANT
CAPT HIP TOTAL 2 ×2 IMPLANT
CARTRIDGE OIL MAESTRO DRILL (MISCELLANEOUS) ×1 IMPLANT
CATH FOL LEG HOLDER (MISCELLANEOUS) ×2 IMPLANT
CATH TRAY METER 16FR LF (MISCELLANEOUS) ×2 IMPLANT
DIFFUSER MAESTRO (MISCELLANEOUS) ×2 IMPLANT
DRAPE INCISE IOBAN 66X60 STRL (DRAPES) ×2 IMPLANT
DRAPE SHEET LG 3/4 BI-LAMINATE (DRAPES) ×2 IMPLANT
DRSG DERMACEA 8X12 NADH (GAUZE/BANDAGES/DRESSINGS) ×2 IMPLANT
DRSG OPSITE POSTOP 3X4 (GAUZE/BANDAGES/DRESSINGS) ×2 IMPLANT
DRSG OPSITE POSTOP 4X12 (GAUZE/BANDAGES/DRESSINGS) ×2 IMPLANT
DRSG OPSITE POSTOP 4X14 (GAUZE/BANDAGES/DRESSINGS) ×2 IMPLANT
DRSG TEGADERM 4X4.75 (GAUZE/BANDAGES/DRESSINGS) ×2 IMPLANT
DURAPREP 26ML APPLICATOR (WOUND CARE) ×2 IMPLANT
ELECT BLADE 6.5 EXT (BLADE) ×2 IMPLANT
ELECT CAUTERY BLADE 6.4 (BLADE) ×2 IMPLANT
GLOVE BIOGEL M STRL SZ7.5 (GLOVE) ×4 IMPLANT
GLOVE INDICATOR 8.0 STRL GRN (GLOVE) ×2 IMPLANT
GLOVE SURG 9.0 ORTHO LTXF (GLOVE) ×2 IMPLANT
GLOVE SURG ORTHO 9.0 STRL STRW (GLOVE) ×2 IMPLANT
GOWN STRL REUS W/ TWL LRG LVL3 (GOWN DISPOSABLE) ×2 IMPLANT
GOWN STRL REUS W/TWL 2XL LVL3 (GOWN DISPOSABLE) ×2 IMPLANT
GOWN STRL REUS W/TWL LRG LVL3 (GOWN DISPOSABLE) ×2
HANDPIECE INTERPULSE COAX TIP (DISPOSABLE) ×1
HEMOVAC 400CC 10FR (MISCELLANEOUS) ×2 IMPLANT
HOOD PEEL AWAY FLYTE STAYCOOL (MISCELLANEOUS) ×4 IMPLANT
KIT RM TURNOVER STRD PROC AR (KITS) ×2 IMPLANT
NDL SAFETY 18GX1.5 (NEEDLE) ×2 IMPLANT
NS IRRIG 500ML POUR BTL (IV SOLUTION) ×2 IMPLANT
OIL CARTRIDGE MAESTRO DRILL (MISCELLANEOUS) ×2
PACK HIP PROSTHESIS (MISCELLANEOUS) ×2 IMPLANT
PIN STEIN THRED 5/32 (Pin) ×2 IMPLANT
SET HNDPC FAN SPRY TIP SCT (DISPOSABLE) ×1 IMPLANT
SOL .9 NS 3000ML IRR  AL (IV SOLUTION) ×1
SOL .9 NS 3000ML IRR UROMATIC (IV SOLUTION) ×1 IMPLANT
SOL PREP PVP 2OZ (MISCELLANEOUS) ×2
SOLUTION PREP PVP 2OZ (MISCELLANEOUS) ×1 IMPLANT
SPONGE DRAIN TRACH 4X4 STRL 2S (GAUZE/BANDAGES/DRESSINGS) ×2 IMPLANT
STAPLER SKIN PROX 35W (STAPLE) ×2 IMPLANT
SUT ETHIBOND #5 BRAIDED 30INL (SUTURE) ×2 IMPLANT
SUT VIC AB 0 CT1 36 (SUTURE) ×2 IMPLANT
SUT VIC AB 1 CT1 36 (SUTURE) ×4 IMPLANT
SUT VIC AB 2-0 CT1 27 (SUTURE) ×1
SUT VIC AB 2-0 CT1 TAPERPNT 27 (SUTURE) ×1 IMPLANT
SYR 20CC LL (SYRINGE) ×2 IMPLANT
TAPE ADH 3 LX (MISCELLANEOUS) ×2 IMPLANT
TAPE TRANSPORE STRL 2 31045 (GAUZE/BANDAGES/DRESSINGS) ×2 IMPLANT
TOWEL OR 17X26 4PK STRL BLUE (TOWEL DISPOSABLE) ×2 IMPLANT

## 2016-01-27 NOTE — Brief Op Note (Signed)
01/27/2016  10:22 AM  PATIENT:  Mariah [name]  70 y.o. female  PRE-OPERATIVE DIAGNOSIS:  OTHER SECONDARY OSTEOARTHRITIS,PSORIATIC ARTHRITIS  POST-OPERATIVE DIAGNOSIS:  OTHER SECONDARY OSTEOARTHRITIS,PSORIATIC ARTHRITIS  PROCEDURE:  Procedure(s): TOTAL HIP ARTHROPLASTY (Right)  SURGEON:  Surgeon(s) and Role:    * Dereck Leep, MD - Primary  ASSISTANTS: Vance Peper, PA   ANESTHESIA:   spinal  EBL:  Total I/O In: 1250 [I.V.:1250] Out: 400 [Urine:300; Blood:100]  BLOOD ADMINISTERED:none  DRAINS: 2 medium Hemovac drains   LOCAL MEDICATIONS USED:  NONE  SPECIMEN:  Source of Specimen:  Right femoral head  DISPOSITION OF SPECIMEN:  PATHOLOGY  COUNTS:  YES  TOURNIQUET:  * No tourniquets in log *  DICTATION: .Dragon Dictation  PLAN OF CARE: Admit to inpatient   PATIENT DISPOSITION:  PACU - hemodynamically stable.   Delay start of Pharmacological VTE agent (>24hrs) due to surgical blood loss or risk of bleeding: yes

## 2016-01-27 NOTE — OR Nursing (Signed)
Psoriasis also noted on right lower arm

## 2016-01-27 NOTE — Op Note (Signed)
OPERATIVE NOTE  DATE OF SURGERY:  01/27/2016  PATIENT NAME:  Mariah [name]   DOB: 07-Feb-1946  MRN: [id number]  PRE-OPERATIVE DIAGNOSIS: Degenerative arthrosis of the right hip, associated with psoriasis  POST-OPERATIVE DIAGNOSIS:  Same  PROCEDURE:  Right total hip arthroplasty  SURGEON:  Marciano Sequin. M.D.  ASSISTANT:  Vance Peper, PA (present and scrubbed throughout the case, critical for assistance with exposure, retraction, instrumentation, and closure)  ANESTHESIA: spinal  ESTIMATED BLOOD LOSS: 100 mL  FLUIDS REPLACED: 1250 mL of crystalloid  DRAINS: 2 medium drains to a Hemovac reservoir  IMPLANTS UTILIZED: DePuy 13.5 mm large stature AML femoral stem, 50 mm OD Pinnacle 100 acetabular component, neutral Pinnacle Altrx polyethylene insert, and a 32 mm CoCr +1 mm hip ball  INDICATIONS FOR SURGERY: Mariah [name] is a 70 y.o. year old female with a long history of progressive hip and groin pain and associated psoriasis. X-rays demonstrated severe degenerative changes. The patient had not seen any significant improvement despite conservative nonsurgical intervention. After discussion of the risks and benefits of surgical intervention, the patient expressed understanding of the risks benefits and agree with plans for total hip arthroplasty.   The risks, benefits, and alternatives were discussed at length including but not limited to the risks of infection, bleeding, nerve injury, stiffness, blood clots, the need for revision surgery, limb length inequality, dislocation, cardiopulmonary complications, among others, and they were willing to proceed.  PROCEDURE IN DETAIL: The patient was brought into the operating room and, after adequate spinal anesthesia was achieved, the patient was placed in a left lateral decubitus position. Axillary roll was placed and all bony prominences were well-padded. The patient's right hip was cleaned and prepped with alcohol and DuraPrep and draped in the  usual sterile fashion. A "timeout" was performed as per usual protocol. A lateral curvilinear incision was made gently curving towards the posterior superior iliac spine. The IT band was incised in line with the skin incision and the fibers of the gluteus maximus were split in line. The piriformis tendon was identified, skeletonized, and incised at its insertion to the proximal femur and reflected posteriorly. A T type posterior capsulotomy was performed. Prior to dislocation of the femoral head, a threaded Steinmann pin was inserted through a separate stab incision into the pelvis superior to the acetabulum and bent in the form of a stylus so as to assess limb length and hip offset throughout the procedure. The femoral head was then dislocated posteriorly. Inspection of the femoral head demonstrated severe degenerative changes with full-thickness loss of articular cartilage. The femoral neck cut was performed using an oscillating saw. The anterior capsule was elevated off of the femoral neck using a periosteal elevator. Attention was then directed to the acetabulum. The remnant of the labrum was excised using electrocautery. Inspection of the acetabulum also demonstrated significant degenerative changes. The acetabulum was reamed in sequential fashion up to a 49 mm diameter. Good punctate bleeding bone was encountered. A 50 mm Pinnacle 100 acetabular component was positioned and impacted into place. Good scratch fit was appreciated. A neutral polyethylene trial was inserted.  Attention was then directed to the proximal femur. A hole for reaming of the proximal femoral canal was created using a high-speed burr. The femoral canal was reamed in sequential fashion up to a 13 mm diameter. This allowed for approximately 5 cm of scratch fit. Serial broaches were inserted up to a 13.5 mm large stature femoral broach. Calcar region was planed and a  trial reduction was performed using a 32 mm hip ball with a +1 mm neck  length. Good equalization of limb lengths and hip offset was appreciated and excellent stability was noted both anteriorly and posteriorly. Trial components were removed. The acetabular shell was irrigated with copious amounts of normal saline with antibiotic solution and suctioned dry. A neutral Pinnacle Altrx polyethylene insert was positioned and impacted into place. Next, a 13.5 mm small stature AML femoral stem was positioned and impacted into place. Excellent scratch fit was appreciated. A trial reduction was again performed with a 32 mm hip ball with a +1 mm neck length. Again, good equalization of limb lengths was appreciated and excellent stability appreciated both anteriorly and posteriorly. The hip was then dislocated and the trial hip ball was removed. The Morse taper was cleaned and dried. A 32 mm cobalt chrome hip ball with a +1 mm neck length was placed on the trunnion and impacted into place. The hip was then reduced and placed through range of motion. Excellent stability was appreciated both anteriorly and posteriorly.  The wound was irrigated with copious amounts of normal saline with antibiotic solution and suctioned dry. Good hemostasis was appreciated. The posterior capsulotomy was repaired using #5 Ethibond. Piriformis tendon was reapproximated to the undersurface of the gluteus medius tendon using #5 Ethibond. Two medium drains were placed in the wound bed and brought out through separate stab incisions to be attached to a Hemovac reservoir. The IT band was reapproximated using interrupted sutures of #1 Vicryl. Subcutaneous tissue was approximated using first #0 Vicryl followed by #2-0 Vicryl. The skin was closed with skin staples.  The patient tolerated the procedure well and was transported to the recovery room in stable condition.   Marciano Sequin., M.D.

## 2016-01-27 NOTE — NC FL2 (Signed)
[place] LEVEL OF CARE SCREENING TOOL     IDENTIFICATION  Patient Name: Mariah [name] Birthdate: [birth year] Sex: female Admission Date (Current Location): 01/27/2016  [place] and [place] Number:  Engineering geologist and Address:  [facility], [address] [place], [place] [address]      Provider Number: [id number]  Attending Physician Name and Address:  Dereck Leep, MD  Relative Name and Phone Number:       Current Level of Care: Hospital Recommended Level of Care: [place] Prior Approval Number:    Date Approved/Denied:   PASRR Number:  [id number])  Discharge Plan: SNF    Current Diagnoses: Patient Active Problem List   Diagnosis Date Noted  . 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 Abdominal aortic aneurysm (AAA) ([place]) 01/13/2016  . Renal mass 01/13/2016  . Nerve root inflammation 10/25/2015  . Psoriatic arthritis ([place]) 10/07/2015  . 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 Arthralgia of hip 08/12/2015  . Arthralgia of multiple joints 08/12/2015  . Age related osteoporosis 08/12/2015  . Chronic obstructive pulmonary disease ([place]) 06/24/2015  . Current tobacco use 06/24/2015  . Senile purpura ([place]) 06/24/2015  . Need for vaccination with 13-polyvalent pneumococcal conjugate vaccine 06/24/2015  . Elevated alkaline phosphatase level 06/24/2015  . Atypical chest pain 06/18/2015  . Abnormal serum level of alkaline phosphatase 06/18/2015  . Elevated hemoglobin ([place]) 06/18/2015  . Fatigue 06/18/2015  . Fistula 06/18/2015  . Blood glucose elevated 06/18/2015  . Gastro-esophageal reflux disease without esophagitis 06/18/2015  . Restless leg 06/18/2015  . Leg pain, right 06/18/2015  . 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 Avitaminosis D 06/18/2015  . Mechanical and motor problems with internal organs 10/11/2009  . Hypercholesteremia 10/11/2009  . Arthritis, degenerative 09/25/2009  . Essential  (primary) hypertension 09/25/2009  . Cephalalgia 09/25/2009  . Psoriasis 09/25/2009  . Compulsive tobacco user syndrome 09/25/2009    Orientation RESPIRATION BLADDER Height & Weight     Self, Time, Situation, Place  Normal Continent Weight: 173 lb (78.472 kg) Height:  5\' 2"  (157.5 cm)  BEHAVIORAL SYMPTOMS/MOOD NEUROLOGICAL BOWEL NUTRITION STATUS   (none )  (none ) Continent Diet (Diet: Clear Liquid )  AMBULATORY STATUS COMMUNICATION OF NEEDS Skin   Extensive Assist Verbally Surgical wounds (Incision: Right Hip. )                       Personal Care Assistance Level of Assistance  Bathing, Feeding, Dressing Bathing Assistance: Limited assistance Feeding assistance: Independent Dressing Assistance: Limited assistance     Functional Limitations Info  Sight, Hearing, Speech Sight Info: Adequate Hearing Info: Adequate Speech Info: Adequate    SPECIAL CARE FACTORS FREQUENCY  PT (By licensed PT), OT (By licensed OT)     PT Frequency:  (5) OT Frequency:  (5)            Contractures      Additional Factors Info  Code Status, Allergies Code Status Info:  (Full Code. ) Allergies Info:  (No Known Allergies. )           Current Medications (01/27/2016):  This is the current hospital active medication list Current Facility-Administered Medications  Medication Dose Route Frequency Provider Last Rate Last Dose  . 0.9 %  sodium chloride infusion   Intravenous Continuous Dereck Leep, MD      . acetaminophen (OFIRMEV) IV 1,000 mg  1,000 mg Intravenous Q6H Dereck Leep, MD      .  acetaminophen (TYLENOL) tablet 650 mg  650 mg Oral Q6H PRN Dereck Leep, MD       Or  . acetaminophen (TYLENOL) suppository 650 mg  650 mg Rectal Q6H PRN Dereck Leep, MD      . alum & mag hydroxide-simeth (MAALOX/MYLANTA) 200-200-20 MG/5ML suspension 30 mL  30 mL Oral Q4H PRN Dereck Leep, MD      . Apremilast TABS 1 tablet  1 tablet Oral BID Dereck Leep, MD      . bisacodyl  (DULCOLAX) suppository 10 mg  10 mg Rectal Daily PRN Dereck Leep, MD      . ceFAZolin (ANCEF) 2-4 GM/100ML-% IVPB           . ceFAZolin (ANCEF) IVPB 2g/100 mL premix  2 g Intravenous Q6H Dereck Leep, MD      . cholecalciferol (VITAMIN D) tablet 2,000 Units  2,000 Units Oral Daily Loleta Dicker, RPH      . colestipol (COLESTID) tablet 2 g  2 g Oral Daily Dereck Leep, MD      . diphenhydrAMINE (BENADRYL) 12.5 MG/5ML elixir 12.5-25 mg  12.5-25 mg Oral Q4H PRN Dereck Leep, MD      . Derrill Memo ON 01/28/2016] enoxaparin (LOVENOX) injection 30 mg  30 mg Subcutaneous Q12H Dereck Leep, MD      . ferrous sulfate tablet 325 mg  325 mg Oral BID WC Dereck Leep, MD      . gabapentin (NEURONTIN) capsule 200 mg  200 mg Oral BID Dereck Leep, MD      . hydrochlorothiazide (HYDRODIURIL) tablet 12.5 mg  12.5 mg Oral Daily Dereck Leep, MD      . magnesium hydroxide (MILK OF MAGNESIA) suspension 30 mL  30 mL Oral Daily PRN Dereck Leep, MD      . menthol-cetylpyridinium (CEPACOL) lozenge 3 mg  1 lozenge Oral PRN Dereck Leep, MD       Or  . phenol (CHLORASEPTIC) mouth spray 1 spray  1 spray Mouth/Throat PRN Dereck Leep, MD      . metoCLOPramide (REGLAN) tablet 10 mg  10 mg Oral TID AC & HS Dereck Leep, MD   10 mg at 01/27/16 1225  . metoprolol succinate (TOPROL-XL) 24 hr tablet 50 mg  50 mg Oral Daily Dereck Leep, MD      . morphine 2 MG/ML injection 2 mg  2 mg Intravenous Q2H PRN Dereck Leep, MD      . nicotine (NICODERM CQ - dosed in mg/24 hours) patch 21 mg  21 mg Transdermal Daily Dereck Leep, MD      . ondansetron (ZOFRAN) tablet 4 mg  4 mg Oral Q6H PRN Dereck Leep, MD       Or  . ondansetron (ZOFRAN) injection 4 mg  4 mg Intravenous Q6H PRN Dereck Leep, MD      . oxyCODONE (Oxy IR/ROXICODONE) immediate release tablet 5-10 mg  5-10 mg Oral Q4H PRN Dereck Leep, MD   5 mg at 01/27/16 1225  . polyethylene glycol powder (GLYCOLAX/MIRALAX) container 17 g  17 g  Oral BID PRN Dereck Leep, MD      . senna-docusate (Senokot-S) tablet 1 tablet  1 tablet Oral BID Dereck Leep, MD      . simvastatin (ZOCOR) tablet 10 mg  10 mg Oral QHS Dereck Leep, MD      . sodium phosphate (  FLEET) 7-19 GM/118ML enema 1 enema  1 enema Rectal Once PRN Dereck Leep, MD      . traMADol Veatrice Bourbon) tablet 50-100 mg  50-100 mg Oral Q4H PRN Dereck Leep, MD      . triamcinolone ointment (KENALOG) 0.5 %   Topical BID Dereck Leep, MD         Discharge Medications: Please see discharge summary for a list of discharge medications.  Relevant Imaging Results:  Relevant Lab Results:   Additional Information  (SSN: [id number])  Loralyn Freshwater, LCSW

## 2016-01-27 NOTE — Transfer of Care (Signed)
Immediate Anesthesia Transfer of Care Note  Patient: Mariah [name]  Procedure(s) Performed: Procedure(s): TOTAL HIP ARTHROPLASTY (Right)  Patient Location: PACU  Anesthesia Type:Spinal  Level of Consciousness: sedated  Airway & Oxygen Therapy: Patient Spontanous Breathing and Patient connected to nasal cannula oxygen  Post-op Assessment: Report given to RN and Post -op Vital signs reviewed and stable  Post vital signs: Reviewed and stable  Last Vitals:  Filed Vitals:   01/27/16 0610 01/27/16 1026  BP: 130/92 117/73  Pulse: 95 60  Temp: 36.6 C 36.3 C  Resp: 14 15    Last Pain: There were no vitals filed for this visit.       Complications: No apparent anesthesia complications

## 2016-01-27 NOTE — OR Nursing (Signed)
Psoriasis noted on left leg and on lower back near sacral dressing

## 2016-01-27 NOTE — Anesthesia Procedure Notes (Signed)
Spinal Patient location during procedure: OR Start time: 01/27/2016 7:18 AM End time: 01/27/2016 7:24 AM Reason for block: at surgeon's request Staffing Anesthesiologist: Marline Backbone F Performed by: anesthesiologist  Preanesthetic Checklist Completed: patient identified, site marked, surgical consent, pre-op evaluation, timeout performed, IV checked, risks and benefits discussed, monitors and equipment checked and at surgeon's request Spinal Block Patient position: sitting Prep: Betadine Patient monitoring: heart rate and blood pressure Approach: right paramedian Location: L3-4 Injection technique: single-shot Needle Needle type: Quincke  Needle gauge: 22 G Needle length: 9 cm Needle insertion depth: 6 cm Assessment Sensory level: T8

## 2016-01-27 NOTE — Care Management Note (Addendum)
Case Management Note  Patient Details  Name: ERIONA AGNES MRN: [id number] Date of Birth: 14-Jul-1946  Subjective/Objective:     Spoke with patient for discharge planning. Patient is alert  and oriented from home alone. Patient will be staying with her sister Judie Bonus and brother in-law at time of discharge if discharged to home. Patient has a front Rolling walker and elevated commode seat. BSC ordered from [facility].  Lovenox 40mg  injection daily for 14 days, called to patient pharmacy [place]. [phone] co-pay $85. Patient informed. Awaiting PT evaluation, offered choice of [place] agencies, list given. Patient would like to go with [facility]. Continue to follow.           Action/Plan: Anticipated discharge plan is home with [place].   Expected Discharge Date:                  Expected Discharge Plan:  [place]  In-House Referral:     Discharge planning Services  CM Consult  Post Acute Care Choice:    Choice offered to:     DME Arranged:    DME Agency:     HH Arranged:  PT HH Agency:     Status of Service:  In process, will continue to follow  If discussed at Long Length of Stay Meetings, dates discussed:    Additional Comments:  Alvie Heidelberg, RN 01/27/2016, 2:43 PM

## 2016-01-27 NOTE — H&P (Signed)
The patient has been re-examined, and the chart reviewed, and there have been no interval changes to the documented history and physical.    The risks, benefits, and alternatives have been discussed at length. The patient expressed understanding of the risks benefits and agreed with plans for surgical intervention.  Miyoko Hashimi P. Lataunya Ruud, Jr. M.D.    

## 2016-01-27 NOTE — Progress Notes (Signed)
PT Cancellation Note  Patient Details Name: Mariah [name] MRN: [id number] DOB: 1945/12/12   Cancelled Treatment:    Reason Eval/Treat Not Completed: Fatigue/lethargy limiting ability to participate. Patient is quite groggy, still experiencing significant pain. She just received pain medication, attempted to see earlier but sensation had not fully returned. Will complete mobility evaluation and treatment tomorrow as appropriate.   Kerman Passey, PT, DPT    01/27/2016, 5:30 PM

## 2016-01-27 NOTE — Anesthesia Preprocedure Evaluation (Signed)
Anesthesia Evaluation  Patient identified by MRN, date of birth, ID band Patient awake    Reviewed: Allergy & Precautions, NPO status , Patient's Chart, lab work & pertinent test results  Airway Mallampati: III       Dental  (+) Teeth Intact   Pulmonary COPD, Current Smoker,    + rhonchi  + decreased breath sounds      Cardiovascular Exercise Tolerance: Good hypertension, Pt. on home beta blockers + Peripheral Vascular Disease   Rhythm:Regular     Neuro/Psych    GI/Hepatic Neg liver ROS, GERD  Medicated,  Endo/Other  negative endocrine ROS  Renal/GU negative Renal ROS     Musculoskeletal   Abdominal Normal abdominal exam  (+)   Peds  Hematology negative hematology ROS (+)   Anesthesia Other Findings   Reproductive/Obstetrics                             Anesthesia Physical Anesthesia Plan  ASA: III  Anesthesia Plan: Spinal   Post-op Pain Management:    Induction: Intravenous  Airway Management Planned: Natural Airway and Nasal Cannula  Additional Equipment:   Intra-op Plan:   Post-operative Plan:   Informed Consent: I have reviewed the patients History and Physical, chart, labs and discussed the procedure including the risks, benefits and alternatives for the proposed anesthesia with the patient or authorized representative who has indicated his/her understanding and acceptance.     Plan Discussed with: CRNA  Anesthesia Plan Comments:         Anesthesia Quick Evaluation

## 2016-01-28 LAB — CBC
HCT: 36.3 % (ref 35.0–47.0)
Hemoglobin: 12.8 g/dL (ref 12.0–16.0)
MCH: 30.6 pg (ref 26.0–34.0)
MCHC: 35.3 g/dL (ref 32.0–36.0)
MCV: 86.7 fL (ref 80.0–100.0)
PLATELETS: 128 10*3/uL — AB (ref 150–440)
RBC: 4.18 MIL/uL (ref 3.80–5.20)
RDW: 13.5 % (ref 11.5–14.5)
WBC: 7.6 10*3/uL (ref 3.6–11.0)

## 2016-01-28 LAB — BASIC METABOLIC PANEL
ANION GAP: 5 (ref 5–15)
CALCIUM: 8.3 mg/dL — AB (ref 8.9–10.3)
CO2: 29 mmol/L (ref 22–32)
Chloride: 100 mmol/L — ABNORMAL LOW (ref 101–111)
Creatinine, Ser: 0.82 mg/dL (ref 0.44–1.00)
GFR calc Af Amer: >60 mL/min (ref >60)
GLUCOSE: 118 mg/dL — AB (ref 65–99)
Potassium: 3.2 mmol/L — ABNORMAL LOW (ref 3.5–5.1)
SODIUM: 134 mmol/L — AB (ref 135–145)

## 2016-01-28 MED ORDER — OXYCODONE HCL 5 MG PO TABS: 5.0000 mg | ORAL_TABLET | ORAL | Status: DC | PRN

## 2016-01-28 MED ORDER — POTASSIUM CHLORIDE 20 MEQ PO PACK
40.0000 meq | PACK | Freq: Two times a day (BID) | ORAL | Status: AC
Start: 2016-01-28 — End: 2016-01-28
  Administered 2016-01-28 (×2): 40 meq via ORAL
  Filled 2016-01-28 (×2): qty 2

## 2016-01-28 MED ORDER — TRAMADOL HCL 50 MG PO TABS: 50.0000 mg | ORAL_TABLET | ORAL | Status: DC | PRN

## 2016-01-28 MED ORDER — ENOXAPARIN SODIUM 40 MG/0.4ML ~~LOC~~ SOLN: 40.0000 mg | SUBCUTANEOUS | Status: DC

## 2016-01-28 NOTE — Progress Notes (Signed)
Received report from [facility]. Nursing care resumed

## 2016-01-28 NOTE — Consult Note (Signed)
   [facility] Inpatient Consult   01/28/2016  EDELYN WILKES 20-Jun-1946 [id number]   Patient screened for potential [employer] services. Patient is eligible for [facility]. Electronic medical record reveals patient's discharge plan is SNF.  [facility] Care Management services not appropriate at this time. If patient's post hospital needs change please place a [facility] Care Management consult. For questions please contact:   Delvecchio Madole RN, [facility] Liaison  [phone]) Business Mobile [phone]) Toll free office

## 2016-01-28 NOTE — Evaluation (Signed)
Physical Therapy Evaluation Patient Details Name: Mariah [name] MRN: [id number] DOB: 1945/08/03 Today's Date: 01/28/2016   History of Present Illness  Pt is a 70 yr old female s/p R posterior THA 7/10 secondary to OA. PMH significant for COPD, GERD, HTN, osteoporosis, and non ruptured AAA.    Clinical Impression  Prior to admission, pt was independent with all functional mobility and ADLs.  Pt lives alone, but would be able to stay with her sister and brother-in-law, who live in a one-story home with 3 STE.  Currently pt is mod A for supine > sit, min A for sit <> stand, and min guard-min A for ambulation x 56ft with RW.  Pt is requiring max cueing to maintain hip precautions with minimal carryover demonstrated.  Pt limited by significant pain (8/10 at rest) and decreased knowledge of precautions, which together decrease her independence with functional mobility.  Pt would benefit from skilled PT to address noted impairments and functional limitations.  Pending progress, recommend pt discharge to STR when medically appropriate.     Follow Up Recommendations SNF (pending progress)    Equipment Recommendations   (pt has RW at home)    Recommendations for Other Services       Precautions / Restrictions Precautions Precautions: Posterior Hip;Fall Precaution Booklet Issued: Yes (comment) Precaution Comments: Pt requires max cueing to maintain precautions. Restrictions Weight Bearing Restrictions: Yes RLE Weight Bearing: Weight bearing as tolerated      Mobility  Bed Mobility Overal bed mobility: Needs Assistance Bed Mobility: Supine to Sit Supine to sit: Mod assist General bed mobility comments: Pt able to participate in supine bridging x 2 with LLE in order to shift hips toward EOB. With [facility] elevated ~30 degrees, use of bed rails, and mod A at RLE and trunk, pt able to achieve supine > sit. Max verbal and tactile cues to maintain posterior hip precautions. Increased time  required.  Transfers Overall transfer level: Needs assistance Equipment used: Rolling walker (2 wheeled) Transfers: Sit to/from Stand (x 2 trials) Sit to Stand: Min assist General transfer comment: Pt able to complete sit > stand transfers from bed and BSC with min A for boost and max cues to maintain posterior hip precautions. Pt educated in precautions, DME use, hand/foot placement, and sequencing of transfers with minimal carryover. Increased time required  Ambulation/Gait Ambulation/Gait assistance: Min guard;Min assist Ambulation Distance (Feet):  (1 x 16ft, 1 x 56ft) Assistive device: Rolling walker (2 wheeled) Gait Pattern/deviations: Step-to pattern;Decreased step length - right;Decreased step length - left;Decreased stance time - right;Decreased weight shift to right;Antalgic Gait velocity: decreased General Gait Details: Pt able to complete ambulatory transfers bed > BSC and BSC > chair. Again, pt requires max education/cueing for posterior hip precautions, DME use, and sequencing. Increased time required. Pt with significant pain/grimacing/breath holding during R limb stance phase, encouraged to breathe and use UEs on RW to offweight limb.   Stairs    Wheelchair Mobility    Modified Rankin (Stroke Patients Only)       Balance Overall balance assessment: Needs assistance Sitting-balance support: Bilateral upper extremity supported;Feet supported Sitting balance-Leahy Scale: Fair Sitting balance - Comments: Pt able to maintain sitting EOB with close standby. Vc's to maintain hip flexion precaution.   Standing balance support: Bilateral upper extremity supported (on RW) Standing balance-Leahy Scale: Fair Standing balance comment: Able to ambulate with bilat UE support on RW and min guard-min A fluctuating.      Pertinent Vitals/Pain Pain Assessment: 0-10 Pain  Score: 8  Pain Location: R hip Pain Intervention(s): Limited activity within patient's tolerance;Monitored  during session;Premedicated before session;Repositioned;Ice applied   HR monitored throughout session and maintained WFL. O2 90% at rest and stayed 90% following ambulation; both on room air.     Home Living Family/patient expects to be discharged to:: Private residence Living Arrangements: Other relatives (sister, brother in Sports coach) Available Help at Discharge: Family Type of Home: House Home Access: Stairs to enter Entrance Stairs-Rails: Can reach both ("I think, I've never tried") Technical brewer of Steps: 3 Home Layout: One level Home Equipment: Cane - quad;Walker - 2 wheels Additional Comments: Pt lives alone, and thus would d/c to sister's house if d/c'ed home.    Prior Function Level of Independence: Independent     Extremity/Trunk Assessment   Upper Extremity Assessment: Defer to OT evaluation   Lower Extremity Assessment: Generalized weakness Bilat LEs at least 3/5 strength throughout, did not resist 2/2 pain. Light touch sensation decreased entire RLE as compared to LLE.  Cervical / Trunk Assessment: Normal    Communication   Communication: No difficulties  Cognition Arousal/Alertness: Awake/alert;Lethargic (fluctuating ) Behavior During Therapy: WFL for tasks assessed/performed Overall Cognitive Status: Within Functional Limits for tasks assessed Memory: Decreased recall of precautions    General Comments General comments (skin integrity, edema, etc.): Honeycomb dressing in place and clean R hip.    Exercises Total Joint Exercises Ankle Circles/Pumps: AROM Quad Sets: AROM Gluteal Sets: AROM (gentle d/t R hip pain) Towel Squeeze: AROM (gentle d/t R hip pain) Short Arc Quad: AROM Heel Slides: AROM LLE;AAROM RLE limited range to maintain precautions Hip ABduction/ADduction: AROM LLE;AAROM RLE limited range d/t R hip pain All exercises performed bilaterally x 10 reps in supine, requiring significantly increased time to complete. Verbal and tactile cues  provided to maintain posterior hip precautions.    Bridges: AROM (2 repetitions to scoot toward EOB)       Assessment/Plan    PT Assessment Patient needs continued PT services  PT Diagnosis Difficulty walking;Generalized weakness;Acute pain   PT Problem List Decreased strength;Decreased activity tolerance;Decreased balance;Decreased mobility;Decreased knowledge of use of DME;Decreased knowledge of precautions  PT Treatment Interventions DME instruction;Gait training;Stair training;Functional mobility training;Therapeutic activities;Therapeutic exercise;Balance training;Patient/family education   PT Goals (Current goals can be found in the Care Plan section) Acute Rehab PT Goals Patient Stated Goal: To return to PLOF PT Goal Formulation: With patient Time For Goal Achievement: 02/11/16 Potential to Achieve Goals: Good    Frequency BID   Barriers to discharge Assist levels, stairs for home entry, decreased knowledge of precautions         End of Session Equipment Utilized During Treatment: Gait belt Activity Tolerance: Patient limited by pain;Patient limited by fatigue Patient left: in chair;with call bell/phone within reach;with chair alarm set;with SCD's reapplied (abduction pillows in place, bilat heels elevated on pillows, ice pack to R hip) Nurse Communication: Mobility status;Precautions;Weight bearing status         Time: [id number] PT Time Calculation (min) (ACUTE ONLY): 56 min   Charges:         PT G Codes:        Oneda Duffett, SPT 01/28/2016, 11:52 AM

## 2016-01-28 NOTE — Anesthesia Postprocedure Evaluation (Signed)
Anesthesia Post Note  Patient: Mariah [name]  Procedure(s) Performed: Procedure(s) (LRB): TOTAL HIP ARTHROPLASTY (Right)  Patient location during evaluation: Nursing Unit Anesthesia Type: Spinal Level of consciousness: oriented and awake and alert Pain management: satisfactory to patient Vital Signs Assessment: post-procedure vital signs reviewed and stable Respiratory status: respiratory function stable Cardiovascular status: blood pressure returned to baseline Postop Assessment: no headache, no backache, spinal receding, patient able to bend at knees and adequate PO intake Anesthetic complications: no    Last Vitals:  Filed Vitals:   01/27/16 2004 01/28/16 0410  BP: 130/66 129/66  Pulse: 77 79  Temp: 36.7 C 36.5 C  Resp: 18 16    Last Pain:  Filed Vitals:   01/28/16 0718  PainSc: 7                  Blima Singer

## 2016-01-28 NOTE — Addendum Note (Signed)
Addendum  created 01/28/16 0727 by Demetrius Charity, CRNA   Modules edited: Notes Section   Notes Section:  Delete: [id number]

## 2016-01-28 NOTE — Progress Notes (Signed)
Clinical Education officer, museum (CSW) met with patient and her sister [name] and presented bed offers. Patient chose [facility]. Kim admissions coordinator at [facility] is aware of accepted bed offer. [facility] Team case manager is aware of above. CSW will continue to follow and assist as needed.   Blima Rich, LCSW [phone]

## 2016-01-28 NOTE — Progress Notes (Signed)
   Subjective: 1 Day Post-Op Procedure(s) (LRB): TOTAL HIP ARTHROPLASTY (Right) Patient reports pain as 6 on 0-10 scale.   Patient is well, and has had no acute complaints or problems We will start therapy today.  Plan is to go Home after hospital stay. no nausea and no vomiting Patient denies any chest pains or shortness of breath. Nicotine patch working No IS in the room  Objective: Vital signs in last 24 hours: Temp:  [96.2 F (35.7 C)-98.1 F (36.7 C)] 97.7 F (36.5 C) (07/11 0410) Pulse Rate:  [58-84] 79 (07/11 0410) Resp:  [11-20] 16 (07/11 0410) BP: (98-130)/(48-95) 129/66 mmHg (07/11 0410) SpO2:  [90 %-100 %] 94 % (07/11 0410) Weight:  [82.872 kg (182 lb 11.2 oz)-85.14 kg (187 lb 11.2 oz)] 85.14 kg (187 lb 11.2 oz) (07/10 1657) Heels are non tender and elevated off the bed using rolled towels Some wheezing bilaterally  Intake/Output from previous day: 07/10 0701 - 07/11 0700 In: 2880 [P.O.:30; I.V.:2350; IV Piggyback:500] Out: 2145 [Urine:1800; Drains:215; Blood:100] Intake/Output this shift:     Recent Labs  01/28/16 0459  HGB 12.8    Recent Labs  01/28/16 0459  WBC 7.6  RBC 4.18  HCT 36.3  PLT 128*    Recent Labs  01/28/16 0459  NA 134*  K 3.2*  CL 100*  CO2 29  BUN <5*  CREATININE 0.82  GLUCOSE 118*  CALCIUM 8.3*   No results for input(s): LABPT, INR in the last 72 hours.  EXAM General - Patient is Alert, Appropriate and Oriented Extremity - Neurologically intact Neurovascular intact Sensation intact distally Intact pulses distally Dorsiflexion/Plantar flexion intact Compartment soft Dressing - dressing C/D/I Motor Function - intact, moving foot and toes well on exam.    Past Medical History  Diagnosis Date  . Hypertension   . Arthritis     Assessment/Plan: 1 Day Post-Op Procedure(s) (LRB): TOTAL HIP ARTHROPLASTY (Right) Active Problems:   S/P total hip arthroplasty  Estimated body mass index is 34.32 kg/(m^2) as  calculated from the following:   Height as of this encounter: 5\' 2"  (1.575 m).   Weight as of this encounter: 85.14 kg (187 lb 11.2 oz). Advance diet Up with therapy D/C IV fluids Plan for discharge tomorrow Discharge home with home health  Labs: k+3.2 DVT Prophylaxis - Lovenox, Foot Pumps and TED hose Weight-Bearing as tolerated to right leg D/C O2 and Pulse OX and try on Room Air Begin working on a bowel movement Labs tomorrow morning Klor con added  [employer]. [place] 01/28/2016, 7:21 AM

## 2016-01-28 NOTE — Evaluation (Signed)
Occupational Therapy Evaluation Patient Details Name: Mariah [name] MRN: [id number] DOB: Jun 14, 1946 Today's Date: 01/28/2016    History of Present Illness Pt is a 70 yr old female s/p R posterior THA 7/10 secondary to OA. PMH significant for COPD, GERD, HTN, osteoporosis, and non ruptured AAA.   Clinical Impression    Pt. Is a 70 y.o. Female who was admitted to [facility] for a R THA. She presents with pain,lethargy from pain meds, limited ROM, weakness, limited activity tolerance, and impaired functional mobility for ADLs. She could benefit from skilled OT services for ADL and A/E retraining, and functional mobility for ADLs in order to improve overall ADL functioning, and return to PLOF. She plans to live with her sister after discharge since she lives at home alone and would benefit from SNF for continued rehab after discharge from the hospital.     Follow Up Recommendations  SNF    Equipment Recommendations  Tub/shower bench    Recommendations for Other Services       Precautions / Restrictions Precautions Precautions: Posterior Hip;Fall Precaution Booklet Issued: Yes (comment) Precaution Comments: Pt requires max cueing to maintain precautions. Restrictions Weight Bearing Restrictions: Yes RLE Weight Bearing: Weight bearing as tolerated      Mobility Bed Mobility Overal bed mobility: Needs Assistance Bed Mobility: Supine to Sit     Supine to sit: Mod assist     General bed mobility comments: Pt able to participate in supine bridging with LLE in order to shift hips toward EOB. With [facility] elevated ~30 degrees, use of bed rails, and mod A at RLE and trunk, pt able to achieve supine > sit. Max verbal and tactile cues to maintain posterior hip precautions. Increased time required.  Transfers Overall transfer level: Needs assistance Equipment used: Rolling walker (2 wheeled) Transfers: Sit to/from Stand (x 2 trials) Sit to Stand: Min assist         General transfer  comment: Pt able to complete sit > stand transfers from bed and BSC. Pt requiring min A for boost up in standing, and max cues to maintain posterior hip precautions. Pt educated in precautions, DME use, hand/foot placement, and sequencing of transfers with minimal carryover.     Balance Overall balance assessment: Needs assistance Sitting-balance support: Bilateral upper extremity supported;Feet supported Sitting balance-Leahy Scale: Fair Sitting balance - Comments: Pt able to maintain sitting EOB with close standby. Vc's to maintain hip flexion precaution.   Standing balance support: Bilateral upper extremity supported (on RW) Standing balance-Leahy Scale: Fair Standing balance comment: Able to ambulate with bilat UE support on RW and min guard-min A fluctuating.                            ADL Overall ADL's : Needs assistance/impaired Eating/Feeding: Set up   Grooming: Wash/dry hands;Wash/dry face;Oral care;Applying deodorant;Set up;Sitting           Upper Body Dressing : Set up;Independent   Lower Body Dressing: Set up;Moderate assistance;Cueing for safety;Cueing for sequencing;Adhering to hip precautions;With adaptive equipment Lower Body Dressing Details (indicate cue type and reason): Mod assist and cues to use reacher and sock aid sitting.  Pt needing cues to participate to stay alert so standing not safe to complete this session. Pt recalled 1/3 hip precautions.                     Vision     Perception     Praxis  Pertinent Vitals/Pain Pain Assessment: 0-10 Pain Score: 8  Pain Location: R hip Pain Descriptors / Indicators: Aching;Constant Pain Intervention(s): Limited activity within patient's tolerance;Monitored during session;Premedicated before session;Repositioned     Hand Dominance Right   Extremity/Trunk Assessment Upper Extremity Assessment Upper Extremity Assessment: Generalized weakness   Lower Extremity Assessment Lower  Extremity Assessment: Defer to PT evaluation   Cervical / Trunk Assessment Cervical / Trunk Assessment: Normal   Communication Communication Communication: No difficulties   Cognition Arousal/Alertness: Lethargic Behavior During Therapy: WFL for tasks assessed/performed Overall Cognitive Status: Within Functional Limits for tasks assessed       Memory: Decreased recall of precautions             General Comments       Exercises Exercises: Total Joint     Shoulder Instructions      Home Living Family/patient expects to be discharged to:: Private residence Living Arrangements: Other relatives (sister) Available Help at Discharge: Family Type of Home: House Home Access: Stairs to enter Technical brewer of Steps: 3 Entrance Stairs-Rails: Can reach both [place]: One level     Bathroom Shower/Tub: Tub/shower unit Shower/tub characteristics: Architectural technologist: Standard     Home Equipment: Kasandra Knudsen - quad;Walker - 2 wheels;Hand held shower head;Adaptive equipment Adaptive Equipment: Reacher Additional Comments: Pt lives alone, and thus would d/c to sister's house if d/c'ed home.      Prior Functioning/Environment Level of Independence: Independent             OT Diagnosis: Generalized weakness;Acute pain   OT Problem List: Decreased safety awareness;Decreased knowledge of use of DME or AE;Decreased activity tolerance;Pain;Decreased strength;Decreased range of motion   OT Treatment/Interventions: Self-care/ADL training;Patient/family education;Therapeutic activities    OT Goals(Current goals can be found in the care plan section) Acute Rehab OT Goals Patient Stated Goal: To return to PLOF OT Goal Formulation: With patient Time For Goal Achievement: 02/11/16 Potential to Achieve Goals: Good ADL Goals Pt Will Perform Lower Body Dressing: with min assist;with adaptive equipment;sit to/from stand (while observing hip precautions) Pt Will Transfer  to Toilet: with min assist;stand pivot transfer;bedside commode (BSC over toilet) Pt Will Perform Toileting - Clothing Manipulation and hygiene: with min assist;sit to/from stand (while observing hip precautions)  OT Frequency: Min 1X/week   Barriers to D/C:            Co-evaluation              End of Session Equipment Utilized During Treatment: Gait belt  Activity Tolerance: Patient limited by lethargy Patient left: in chair;with call bell/phone within reach;with chair alarm set   Time: 1040-1110 OT Time Calculation (min): 30 min Charges:  OT General Charges $OT Visit: 1 Procedure OT Evaluation $OT Eval Moderate Complexity: 1 Procedure OT Treatments $Self Care/Home Management : 8-22 mins G-Codes:     Chrys Racer, OTR/L ascom [phone] 01/28/2016, 1:40 PM

## 2016-01-28 NOTE — Progress Notes (Signed)
Incentive Spirometer placed at bedside, educated patient on use.

## 2016-01-28 NOTE — Clinical Social Work Note (Signed)
Clinical Social Work Assessment  Patient Details  Name: Mariah [name] MRN: [id number] Date of Birth: 02-17-1946  Date of referral:  01/28/16               Reason for consult:  Facility Placement                Permission sought to share information with:  Chartered certified accountant granted to share information::  Yes, Verbal Permission Granted  Name::      [place]::   [place]   Relationship::     Contact Information:     Housing/Transportation Living arrangements for the past 2 months:  [place] of Information:  Patient Patient Interpreter Needed:  None Criminal Activity/Legal Involvement Pertinent to Current Situation/Hospitalization:  No - Comment as needed Significant Relationships:  Siblings Lives with:  Self Do you feel safe going back to the place where you live?  Yes Need for family participation in patient care:  Yes (Comment)  Care giving concerns:  Patient lives alone in [place].    Social Worker assessment / plan:  Holiday representative (CSW) received SNF consult. PT is recommending SNF with possible progression to home health. CSW met with patient alone at bedside. Patient is familiar with CSW from [facility]. Patient reported that she lives alone in [place] and her sister [name] and brother in law live down the street from her. CSW explained SNF process and that patient's insurance [employer] will have to approve SNF stay. Patient is agreeable to SNF search and prefers [facility]   FL2 complete and faxed out. CSW will continue to follow and assist as needed.   Employment status:  Retired Nurse, adult PT Recommendations:  [place] / Referral to community resources:  [place]  Patient/Family's Response to care:  Patient is agreeable to [facility] and prefers [facility]   Patient/Family's Understanding of and  Emotional Response to Diagnosis, Current Treatment, and Prognosis:  Patient was pleasant and thanked CSW for visit.   Emotional Assessment Appearance:  Appears stated age Attitude/Demeanor/Rapport:  Lethargic Affect (typically observed):  Accepting, Adaptable, Pleasant Orientation:  Oriented to Self, Oriented to Place, Oriented to  Time, Oriented to Situation Alcohol / Substance use:  Not Applicable Psych involvement (Current and /or in the community):  No (Comment)  Discharge Needs  Concerns to be addressed:  Discharge Planning Concerns Readmission within the last 30 days:  No Current discharge risk:  Dependent with Mobility Barriers to Discharge:  Continued Medical Work up   Loralyn Freshwater, LCSW 01/28/2016, 12:08 PM

## 2016-01-28 NOTE — Discharge Instructions (Signed)

## 2016-01-28 NOTE — Discharge Summary (Signed)
Physician Discharge Summary  Patient ID: Mariah [name] MRN: [id number] DOB/AGE: [birth year] 70 y.o.  Admit date: 01/27/2016 Discharge date: 01/29/2016  Admission Diagnoses:  OTHER SECONDARY OSTEOARTHRITIS,PSORIATIC ARTHRITIS   Discharge Diagnoses: Patient Active Problem List   Diagnosis Date Noted  . 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 Abdominal aortic aneurysm (AAA) ([place]) 01/13/2016  . Renal mass 01/13/2016  . Nerve root inflammation 10/25/2015  . Psoriatic arthritis ([place]) 10/07/2015  . 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 Arthralgia of hip 08/12/2015  . Arthralgia of multiple joints 08/12/2015  . Age related osteoporosis 08/12/2015  . Chronic obstructive pulmonary disease ([place]) 06/24/2015  . Current tobacco use 06/24/2015  . Senile purpura ([place]) 06/24/2015  . Need for vaccination with 13-polyvalent pneumococcal conjugate vaccine 06/24/2015  . Elevated alkaline phosphatase level 06/24/2015  . Atypical chest pain 06/18/2015  . Abnormal serum level of alkaline phosphatase 06/18/2015  . Elevated hemoglobin ([place]) 06/18/2015  . Fatigue 06/18/2015  . Fistula 06/18/2015  . Blood glucose elevated 06/18/2015  . Gastro-esophageal reflux disease without esophagitis 06/18/2015  . Restless leg 06/18/2015  . Leg pain, right 06/18/2015  . 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 Avitaminosis D 06/18/2015  . Mechanical and motor problems with internal organs 10/11/2009  . Hypercholesteremia 10/11/2009  . Arthritis, degenerative 09/25/2009  . Essential (primary) hypertension 09/25/2009  . Cephalalgia 09/25/2009  . Psoriasis 09/25/2009  . Compulsive tobacco user syndrome 09/25/2009    Past Medical History  Diagnosis Date  . Hypertension   . Arthritis      Transfusion: No transfusion given during this admission   Consultants (if any):  Case management for Home Health  Discharged Condition: Improved  Hospital Course: Mariah [name] is an 70 y.o. female who was  admitted 01/27/2016 with a diagnosis of degenerative arthrosis right hip and went to the operating room on 01/27/2016 and underwent the above named procedures.    Surgeries:Procedure(s): TOTAL HIP ARTHROPLASTY on 01/27/2016  PRE-OPERATIVE DIAGNOSIS: Degenerative arthrosis of the right hip, associated with psoriasis  POST-OPERATIVE DIAGNOSIS: Same  PROCEDURE: Right total hip arthroplasty  SURGEON: Marciano Sequin. M.D.  ASSISTANT: Vance Peper, PA (present and scrubbed throughout the case, critical for assistance with exposure, retraction, instrumentation, and closure)  ANESTHESIA: spinal  ESTIMATED BLOOD LOSS: 100 mL  FLUIDS REPLACED: 1250 mL of crystalloid  DRAINS: 2 medium drains to a Hemovac reservoir  IMPLANTS UTILIZED: DePuy 13.5 mm large stature AML femoral stem, 50 mm OD Pinnacle 100 acetabular component, neutral Pinnacle Altrx polyethylene insert, and a 32 mm CoCr +1 mm hip ball  INDICATIONS FOR SURGERY: Mariah [name] is a 70 y.o. year old female with a long history of progressive hip and groin pain and associated psoriasis. X-rays demonstrated severe degenerative changes. The patient had not seen any significant improvement despite conservative nonsurgical intervention. After discussion of the risks and benefits of surgical intervention, the patient expressed understanding of the risks benefits and agree with plans for total hip arthroplasty.   The risks, benefits, and alternatives were discussed at length including but not limited to the risks of infection, bleeding, nerve injury, stiffness, blood clots, the need for revision surgery, limb length inequality, dislocation, cardiopulmonary complications, among others, and they were willing to proceed. Patient tolerated the surgery well. No complications .Patient was taken to PACU where she was stabilized and then transferred to the orthopedic floor.  Patient started on Lovenox 30 q 12 hrs. Foot pumps applied bilaterally at 80 mm  hg. Heels elevated off  bed with rolled towels. No evidence of DVT. Calves non tender. Negative [place]oman. Physical therapy started on day #1 for gait training and transfer with OT starting on  day #1 for ADL and assisted devices. Patient has been very slow with rehab. Ambulated 10 feet upon being discharged.  Patient's IV and foley were d/c on day # 1 with the  hemovac was d/c on day #2. Dressing changed on day number # 2   She was given perioperative antibiotics:  Anti-infectives    Start     Dose/Rate Route Frequency Ordered Stop   01/27/16 1200  ceFAZolin (ANCEF) IVPB 2g/100 mL premix     2 g 200 mL/hr over 30 Minutes Intravenous Every 6 hours 01/27/16 1157 01/28/16 0701   01/27/16 0629  ceFAZolin (ANCEF) 2-4 GM/100ML-% IVPB    Comments:  Hallaji, Violet Ann: cabinet override      01/27/16 0629 01/27/16 1829   01/27/16 0226  ceFAZolin (ANCEF) IVPB 2g/100 mL premix     2 g 200 mL/hr over 30 Minutes Intravenous On call to O.R. 01/27/16 [id number] 01/27/16 0746    .  She was fitted with AV 1 compression foot pump devices, instructed on heel pump, early ambulation, and fitted with TED stocking  for DVT prophylaxis.  She benefited maximally from the hospital stay and there were no complications.    Recent vital signs:  Filed Vitals:   01/28/16 0410 01/28/16 0729  BP: 129/66 136/59  Pulse: 79 90  Temp: 97.7 F (36.5 C) 98.6 F (37 C)  Resp: 16 18    Recent laboratory studies:  Lab Results  Component Value Date   HGB 12.8 01/28/2016   HGB 15.4 01/15/2016   HGB 16.7* 10/12/2014   Lab Results  Component Value Date   WBC 7.6 01/28/2016   PLT 128* 01/28/2016   Lab Results  Component Value Date   INR 0.91 01/15/2016   Lab Results  Component Value Date   NA 134* 01/28/2016   K 3.2* 01/28/2016   CL 100* 01/28/2016   CO2 29 01/28/2016   BUN <5* 01/28/2016   CREATININE 0.82 01/28/2016   GLUCOSE 118* 01/28/2016    Discharge Medications:     Medication List    STOP taking  these medications        aspirin 81 MG tablet      TAKE these medications        betamethasone dipropionate 0.05 % ointment  Commonly known as:  DIPROLENE  Apply topically 2 (two) times daily.     colestipol 1 g tablet  Commonly known as:  COLESTID  Take 2 tablets by mouth daily.     diphenhydrAMINE 25 MG tablet  Commonly known as:  SOMINEX  Take 25 mg by mouth as needed for itching.     enoxaparin 40 MG/0.4ML injection  Commonly known as:  LOVENOX  Inject 0.4 mLs (40 mg total) into the skin daily.     gabapentin 300 MG capsule  Commonly known as:  NEURONTIN  TAKE 1 CAPSULE (300 MG TOTAL) BY MOUTH 2 (TWO) TIMES DAILY.     hydrochlorothiazide 12.5 MG tablet  Commonly known as:  HYDRODIURIL  TAKE 1 TABLET EVERY DAY     meloxicam 15 MG tablet  Commonly known as:  MOBIC  Take 15 mg by mouth daily. with food     metoprolol succinate 50 MG 24 hr tablet  Commonly known as:  TOPROL-XL  TAKE 1 TABLET EVERY DAY  OTEZLA 30 MG Tabs  Generic drug:  Apremilast  Take 1 tablet by mouth 2 (two) times daily.     oxyCODONE 5 MG immediate release tablet  Commonly known as:  Oxy IR/ROXICODONE  Take 1-2 tablets (5-10 mg total) by mouth every 4 (four) hours as needed for severe pain.     pantoprazole 20 MG tablet  Commonly known as:  PROTONIX  TAKE 1 TABLET BY MOUTH ONCE DAILY     polyethylene glycol powder powder  Commonly known as:  GLYCOLAX/MIRALAX  Take 17 g by mouth 2 (two) times daily as needed.     simvastatin 10 MG tablet  Commonly known as:  ZOCOR  TAKE 1 TABLET AT BEDTIME     traMADol 50 MG tablet  Commonly known as:  ULTRAM  TAKE 1 TABLET 4 TIMES A DAY AS NEEDED     traMADol 50 MG tablet  Commonly known as:  ULTRAM  Take 1-2 tablets (50-100 mg total) by mouth every 4 (four) hours as needed for moderate pain.     Vitamin D3 2000 units Tabs  Take 2,000 Units by mouth daily.        Diagnostic Studies: Dg Hip Port Unilat With Pelvis 1v Right  01/27/2016   CLINICAL DATA:  Post RIGHT total hip replacement EXAM: DG HIP (WITH OR WITHOUT PELVIS) 1V PORT RIGHT COMPARISON:  Portable exam 1038 hours compared to 12/26/2013 FINDINGS: RIGHT hip prosthesis identified. No fracture dislocation. Bones demineralized. Surgical drain present. IMPRESSION: RIGHT hip prosthesis without acute fracture or dislocation. Electronically Signed   By: Lavonia Dana M.D.   On: 01/27/2016 11:08    Disposition: Final discharge disposition not confirmed      Discharge Instructions    Diet - low sodium heart healthy    Complete by:  As directed      Increase activity slowly    Complete by:  As directed            Follow-up Information    Follow up with Dereck Leep, MD On 03/10/2016.   Specialty:  Orthopedic Surgery   Why:  at 10:45am   Contact information:   [place] [address] [phone]        Signed: Watt Climes 01/28/2016, 7:35 AM

## 2016-01-28 NOTE — Clinical Social Work Placement (Signed)
   CLINICAL SOCIAL WORK PLACEMENT  NOTE  Date:  01/28/2016  Patient Details  Name: Mariah [name] MRN: [id number] Date of Birth: 28-Apr-1946  Clinical Social Work is seeking post-discharge placement for this patient at the [place] level of care (*CSW will initial, date and re-position this form in  chart as items are completed):  Yes   Patient/family provided with [facility] Work Department's list of facilities offering this level of care within the geographic area requested by the patient (or if unable, by the patient's family).  Yes   Patient/family informed of their freedom to choose among providers that offer the needed level of care, that participate in Medicare, Medicaid or managed care program needed by the patient, have an available bed and are willing to accept the patient.  Yes   Patient/family informed of [facility]'s ownership interest in [facility] and [facility], as well as of the fact that they are under no obligation to receive care at these facilities.  PASRR submitted to EDS on 01/28/16     PASRR number received on 01/28/16     Existing PASRR number confirmed on       FL2 transmitted to all facilities in geographic area requested by pt/family on 01/28/16     FL2 transmitted to all facilities within larger geographic area on       Patient informed that his/her managed care company has contracts with or will negotiate with certain facilities, including the following:            Patient/family informed of bed offers received.  Patient chooses bed at       Physician recommends and patient chooses bed at      Patient to be transferred to   on  .  Patient to be transferred to facility by       Patient family notified on   of transfer.  Name of family member notified:        PHYSICIAN       Additional Comment:    _______________________________________________ Loralyn Freshwater, LCSW 01/28/2016, 12:07 PM

## 2016-01-28 NOTE — Progress Notes (Signed)
Physical Therapy Treatment Patient Details Name: Mariah [name] MRN: [id number] DOB: 02-04-1946 Today's Date: 01/28/2016    History of Present Illness Pt is a 70 yr old female s/p R posterior THA 7/10 secondary to OA. PMH significant for COPD, GERD, HTN, osteoporosis, and non ruptured AAA.    PT Comments    Pt making progress towards PT goals, but remains significantly limited by R hip pain, lethargy (suspect d/t pain medication) and overall decreased activity tolerance. Pt only able to ambulate 58ft with RW and min A before requesting to sit 2/2 fatigue. Continues to require max/constant cueing for mobility sequencing and to maintain posterior hip precautions. Due to current assist levels and education/cueing required, maintain recommendation for STR.    Follow Up Recommendations  SNF (pending progress)     Equipment Recommendations   (pt has RW at home)    Recommendations for Other Services       Precautions / Restrictions Precautions Precautions: Posterior Hip;Fall Precaution Comments: Pt requires max cueing to maintain precautions. Restrictions Weight Bearing Restrictions: Yes RLE Weight Bearing: Weight bearing as tolerated    Mobility  Bed Mobility Overal bed mobility: Needs Assistance Bed Mobility: Sit to Supine Supine to sit: Min assist General bed mobility comments: Min A for RLE management and maintenance of posterior hip precautions. Pt able to manage trunk and partipate in bridging with LLE and BUE on trapeze bar to scoot up in bed. Increased time required.   Transfers Overall transfer level: Needs assistance Equipment used: Rolling walker (2 wheeled) Transfers: Sit to/from Stand (x 2 trials) Sit to Stand: Min assist General transfer comment: Continues to require max cueing for maintenance of posterior hip precautions. Increased use of BUE on arm rests to assume stand. Additional time required.  Ambulation/Gait Ambulation/Gait assistance: Min assist Ambulation  Distance (Feet): 10 Feet Assistive device: Rolling walker (2 wheeled) Gait Pattern/deviations: Step-to pattern;Decreased step length - right;Decreased step length - left;Decreased stance time - right;Decreased weight shift to right;Antalgic Gait velocity: decreased   General Gait Details: Maximal and constant cueing for sequencing of ambulation with DME. Significant pain persists during R limb stance phase, pt needing standing rest after nearly every step. ~5 minutes to ambulate x 10 feet. Distance limited by fatigue and pain "I need that chair and I need it quick".   Stairs    Wheelchair Mobility    Modified Rankin (Stroke Patients Only)       Balance Overall balance assessment: Needs assistance Sitting-balance support: Bilateral upper extremity supported;Feet supported Sitting balance-Leahy Scale: Fair     Standing balance support: Bilateral upper extremity supported (on RW) Standing balance-Leahy Scale: Fair      Cognition Arousal/Alertness: Awake/alert;Lethargic (fluctuating) Behavior During Therapy: WFL for tasks assessed/performed Overall Cognitive Status: Within Functional Limits for tasks assessed Memory: Decreased recall of precautions    Exercises Total Joint Exercises Ankle Circles/Pumps: AROM Quad Sets: AROM Gluteal Sets: AROM Short Arc Quad: AROM Heel Slides: AROM LLE;AAROM RLE Hip ABduction/ADduction: AROM LLE;AAROM RLE All exercises performed bilaterally x 10 reps in supine.     General Comments General comments (skin integrity, edema, etc.): Honeycomb dressing in place R hip with scant drainage noted.      Pertinent Vitals/Pain Pain Assessment: 0-10 Pain Score: 6  Pain Location: R hip Pain Descriptors / Indicators: Aching;Constant Pain Intervention(s): Limited activity within patient's tolerance;Monitored during session;Premedicated before session;Ice applied;Repositioned  SaO2 90-92% on RA during our session; HR WFL    Home Living  Family/patient expects to be discharged to::  Private residence Living Arrangements: Other relatives (sister) Available Help at Discharge: Family Type of Home: House Home Access: Stairs to enter Entrance Stairs-Rails: Can reach both Home Layout: One level [place]: Kasandra Knudsen - quad;Walker - 2 wheels;Hand held shower head;Adaptive equipment Additional Comments: Pt lives alone, and thus would d/c to sister's house if d/c'ed home.    Prior Function Level of Independence: Independent          PT Goals (current goals can now be found in the care plan section) Acute Rehab PT Goals Patient Stated Goal: To return to PLOF PT Goal Formulation: With patient Time For Goal Achievement: 02/11/16 Potential to Achieve Goals: Good Progress towards PT goals: Progressing toward goals    Frequency  BID    PT Plan Current plan remains appropriate    Co-evaluation             End of Session Equipment Utilized During Treatment: Gait belt Activity Tolerance: Patient limited by pain;Patient limited by fatigue Patient left: in bed;with call bell/phone within reach;with bed alarm set;with family/visitor present;with SCD's reapplied (abduction pillows in place, bilat heels elevated on pillows)     Time: 1420-1502 PT Time Calculation (min) (ACUTE ONLY): 42 min  Charges:                      G Codes:      Prinston Kynard, SPT 01/28/2016, 4:03 PM

## 2016-01-28 NOTE — Anesthesia Post-op Follow-up Note (Deleted)
  Anesthesia Pain Follow-up Note  Patient: Mariah [name]  Day #: 1  Date of Follow-up: 01/28/2016 Time: 7:26 AM  Last Vitals:  Filed Vitals:   01/27/16 2004 01/28/16 0410  BP: 130/66 129/66  Pulse: 77 79  Temp: 36.7 C 36.5 C  Resp: 18 16    Level of Consciousness: alert  Pain: moderate   Side Effects:None  Catheter Site Exam: site not evaluated  Anti-Coag Meds    Start     Dose/Rate Route Frequency Ordered Stop   01/28/16 0800  enoxaparin (LOVENOX) injection 30 mg     30 mg Subcutaneous Every 12 hours 01/27/16 1157         Plan: D/C from anesthesia care  Blima Singer

## 2016-01-28 NOTE — Progress Notes (Signed)
Pt has voided yet. Bladder scan shows >429. rn spoke to [facility] ,pa who ordered in/out once

## 2016-01-29 ENCOUNTER — Encounter
Admission: RE | Admit: 2016-01-29 | Discharge: 2016-01-29 | Disposition: A | Discharge status: Home / Self Care | Payer: PPO | Source: Ambulatory Visit | Attending: Internal Medicine | Admitting: Internal Medicine

## 2016-01-29 DIAGNOSIS — Z743 Need for continuous supervision: Secondary | ICD-10-CM | POA: Diagnosis not present

## 2016-01-29 DIAGNOSIS — R262 Difficulty in walking, not elsewhere classified: Secondary | ICD-10-CM | POA: Diagnosis not present

## 2016-01-29 DIAGNOSIS — G2581 Restless legs syndrome: Secondary | ICD-10-CM | POA: Diagnosis not present

## 2016-01-29 DIAGNOSIS — I10 Essential (primary) hypertension: Secondary | ICD-10-CM | POA: Diagnosis not present

## 2016-01-29 DIAGNOSIS — I714 Abdominal aortic aneurysm, without rupture: Secondary | ICD-10-CM | POA: Diagnosis not present

## 2016-01-29 DIAGNOSIS — Z87891 Personal history of nicotine dependence: Secondary | ICD-10-CM | POA: Diagnosis not present

## 2016-01-29 DIAGNOSIS — R2689 Other abnormalities of gait and mobility: Secondary | ICD-10-CM | POA: Diagnosis not present

## 2016-01-29 DIAGNOSIS — E7800 Pure hypercholesterolemia, unspecified: Secondary | ICD-10-CM | POA: Diagnosis not present

## 2016-01-29 DIAGNOSIS — M159 Polyosteoarthritis, unspecified: Secondary | ICD-10-CM | POA: Diagnosis not present

## 2016-01-29 DIAGNOSIS — K219 Gastro-esophageal reflux disease without esophagitis: Secondary | ICD-10-CM | POA: Diagnosis not present

## 2016-01-29 DIAGNOSIS — Z96641 Presence of right artificial hip joint: Secondary | ICD-10-CM | POA: Diagnosis not present

## 2016-01-29 DIAGNOSIS — M810 Age-related osteoporosis without current pathological fracture: Secondary | ICD-10-CM | POA: Diagnosis not present

## 2016-01-29 DIAGNOSIS — M25551 Pain in right hip: Secondary | ICD-10-CM | POA: Diagnosis not present

## 2016-01-29 DIAGNOSIS — Z471 Aftercare following joint replacement surgery: Secondary | ICD-10-CM | POA: Diagnosis not present

## 2016-01-29 DIAGNOSIS — J449 Chronic obstructive pulmonary disease, unspecified: Secondary | ICD-10-CM | POA: Diagnosis not present

## 2016-01-29 DIAGNOSIS — L4050 Arthropathic psoriasis, unspecified: Secondary | ICD-10-CM | POA: Diagnosis not present

## 2016-01-29 LAB — BASIC METABOLIC PANEL
ANION GAP: 6 (ref 5–15)
BUN: 7 mg/dL (ref 6–20)
CALCIUM: 8.5 mg/dL — AB (ref 8.9–10.3)
CO2: 30 mmol/L (ref 22–32)
CREATININE: 0.83 mg/dL (ref 0.44–1.00)
Chloride: 98 mmol/L — ABNORMAL LOW (ref 101–111)
GFR calc non Af Amer: >60 mL/min (ref >60)
Glucose, Bld: 121 mg/dL — ABNORMAL HIGH (ref 65–99)
Potassium: 4 mmol/L (ref 3.5–5.1)
SODIUM: 134 mmol/L — AB (ref 135–145)

## 2016-01-29 LAB — CBC
HEMATOCRIT: 40 % (ref 35.0–47.0)
HEMOGLOBIN: 13.9 g/dL (ref 12.0–16.0)
MCH: 30.3 pg (ref 26.0–34.0)
MCHC: 34.7 g/dL (ref 32.0–36.0)
MCV: 87.2 fL (ref 80.0–100.0)
Platelets: 152 10*3/uL (ref 150–440)
RBC: 4.58 MIL/uL (ref 3.80–5.20)
RDW: 13.9 % (ref 11.5–14.5)
WBC: 10 10*3/uL (ref 3.6–11.0)

## 2016-01-29 LAB — SURGICAL PATHOLOGY

## 2016-01-29 MED ORDER — ENOXAPARIN SODIUM 30 MG/0.3ML ~~LOC~~ SOLN: 30.0000 mg | Freq: Two times a day (BID) | SUBCUTANEOUS | Status: DC

## 2016-01-29 NOTE — Progress Notes (Signed)
Patient discharging to [facility]. Report called to facility. EMS arranged for 12pm.

## 2016-01-29 NOTE — Progress Notes (Signed)
Physical Therapy Treatment Patient Details Name: Mariah [name] MRN: [id number] DOB: 1946/07/19 Today's Date: 01/29/2016    History of Present Illness Pt is a 70 yr old female s/p R posterior THA 7/10 secondary to OA. PMH significant for COPD, GERD, HTN, osteoporosis, and non ruptured AAA.    PT Comments    Pt continues to make slight progress towards mobility goals, able to ambulate x2ft with RW and min A. Pt displays minimal carryover with hip precautions, only able to recall 2/3 when prompted. Mod-max cueing to maintain precautions with all aspects of mobility. Pt's independence is limited by pain (10/10 R hip pain with mobility), decreased activity tolerance, and decreased recall of precautions.     Follow Up Recommendations  SNF     Equipment Recommendations   (pt has equipment)    Recommendations for Other Services       Precautions / Restrictions Precautions Precautions: Posterior Hip;Fall Precaution Comments: Pt requiring mod-max cues to maintain. Restrictions Weight Bearing Restrictions: Yes RLE Weight Bearing: Weight bearing as tolerated    Mobility  Bed Mobility Overal bed mobility: Needs Assistance Bed Mobility: Supine to Sit Supine to sit: Min assist General bed mobility comments: HOB elevated and use of bed rails; min A for RLE management and maintenance of post hip precautions; increased time required  Transfers Overall transfer level: Needs assistance Equipment used: Rolling walker (2 wheeled) Transfers: Sit to/from Stand (x 2 trials) Sit to Stand: Min assist General transfer comment: Minimal carryover with maintaining precautions, thus requires mod-max cueing. Increased use of bilat UEs to assume stand. Additional time required  Ambulation/Gait Ambulation/Gait assistance: Min assist Ambulation Distance (Feet): 13 Feet Assistive device: Rolling walker (2 wheeled) Gait Pattern/deviations: Step-to pattern;Decreased step length - right;Decreased step  length - left;Decreased stance time - right;Decreased weight shift to right;Antalgic Gait velocity: decreased General Gait Details: Close chair follow d/t history of quick fatigue. Mod-max cueing for sequencing of ambulation with DME. 10/10 R hip pain during R stance. Significantly increased time required.   Stairs    Wheelchair Mobility    Modified Rankin (Stroke Patients Only)       Balance Overall balance assessment: Needs assistance Sitting-balance support: Bilateral upper extremity supported;Feet supported Sitting balance-Leahy Scale: Fair Standing balance support: Bilateral upper extremity supported (on RW) Standing balance-Leahy Scale: Fair    Cognition Arousal/Alertness: Awake/alert Behavior During Therapy: WFL for tasks assessed/performed Overall Cognitive Status: Within Functional Limits for tasks assessed Memory: Decreased recall of precautions (Pt able to accurately recall 2/3 posterior hip precautions)    Exercises Total Joint Exercises Ankle Circles/Pumps: AROM Quad Sets: AROM Gluteal Sets: AROM Short Arc Quad: AROM Heel Slides: AROM LLE;AAROM RLE Hip ABduction/ADduction: AROM LLE;AAROM RLE All exercises performed bilaterally x 10 reps in supine.     General Comments General comments (skin integrity, edema, etc.): Honeycomb dressing in tact R hip Nursing cleared pt for participation in physical therapy.  Pt agreeable to PT session.      Pertinent Vitals/Pain Pain Assessment: 0-10 Pain Score: 10-Worst pain ever (with activity, 6/10 at rest (RN aware)) Pain Location: R hip Pain Descriptors / Indicators: Aching;Constant Pain Intervention(s): Limited activity within patient's tolerance;Monitored during session;Premedicated before session;Repositioned;Ice applied           PT Goals (current goals can now be found in the care plan section) Acute Rehab PT Goals Patient Stated Goal: To return to PLOF PT Goal Formulation: With patient Time For Goal  Achievement: 02/11/16 Potential to Achieve Goals: Good Progress towards PT  goals: Progressing toward goals    Frequency  BID    PT Plan Current plan remains appropriate       End of Session Equipment Utilized During Treatment: Gait belt Activity Tolerance: Patient limited by pain;Patient limited by fatigue Patient left: in chair;with call bell/phone within reach;with chair alarm set;with SCD's reapplied (abduction pillows in place, bilat heels elevated on pillows, ice pack in place R hip)     Time: [id number] PT Time Calculation (min) (ACUTE ONLY): 40 min  Charges:                       G Codes:      Yesennia Hirota, SPT 01/29/2016, 2:06 PM

## 2016-01-29 NOTE — Progress Notes (Signed)
   Subjective: 2 Days Post-Op Procedure(s) (LRB): TOTAL HIP ARTHROPLASTY (Right) Patient reports pain as 7 on 0-10 scale.  But did sleep well during the night. Patient is well, and has had no acute complaints or problems Continue with physical therapy today.  Plan is to go Rehab after hospital stay. no nausea and no vomiting Patient denies any chest pains or shortness of breath. Objective: Vital signs in last 24 hours: Temp:  [98.6 F (37 C)-99 F (37.2 C)] 99 F (37.2 C) (07/12 0349) Pulse Rate:  [72-95] 95 (07/12 0349) Resp:  [18] 18 (07/12 0349) BP: (100-141)/(45-72) 141/72 mmHg (07/12 0349) SpO2:  [90 %-93 %] 90 % (07/12 0349) well approximated incision Heels are non tender and elevated off the bed using rolled towels Intake/Output from previous day: 07/11 0701 - 07/12 0700 In: 1328.3 [P.O.:120; I.V.:1208.3] Out: 1051 [Urine:1050; Stool:1] Intake/Output this shift: Total I/O In: -  Out: 550 [Urine:550]   Recent Labs  01/28/16 0459 01/29/16 0421  HGB 12.8 13.9    Recent Labs  01/28/16 0459 01/29/16 0421  WBC 7.6 10.0  RBC 4.18 4.58  HCT 36.3 40.0  PLT 128* 152    Recent Labs  01/28/16 0459 01/29/16 0421  NA 134* 134*  K 3.2* 4.0  CL 100* 98*  CO2 29 30  BUN <5* 7  CREATININE 0.82 0.83  GLUCOSE 118* 121*  CALCIUM 8.3* 8.5*   No results for input(s): LABPT, INR in the last 72 hours.  EXAM General - Patient is Alert, Appropriate and Oriented Extremity - Neurologically intact Neurovascular intact Sensation intact distally Intact pulses distally Dorsiflexion/Plantar flexion intact Compartment soft Dressing - dressing C/D/I Motor Function - intact, moving foot and toes well on exam.    Past Medical History  Diagnosis Date  . Hypertension   . Arthritis     Assessment/Plan: 2 Days Post-Op Procedure(s) (LRB): TOTAL HIP ARTHROPLASTY (Right) Active Problems:   S/P total hip arthroplasty  Estimated body mass index is 34.32 kg/(m^2) as  calculated from the following:   Height as of this encounter: 5\' 2"  (1.575 m).   Weight as of this encounter: 85.14 kg (187 lb 11.2 oz). Up with therapy Discharge to SNF  Labs: reviewed K+4.0 DVT Prophylaxis - Lovenox, Foot Pumps and TED hose Weight-Bearing as tolerated to right leg Discussed with pt the rehab situation Staples out in 2 weeks.    Jillyn Ledger. [place] 01/29/2016, 6:59 AM

## 2016-01-29 NOTE — Progress Notes (Signed)
Patient had own medication Otezla stored in pharmacy. Medication given back to patient for discharge.

## 2016-01-29 NOTE — Clinical Social Work Placement (Signed)
   CLINICAL SOCIAL WORK PLACEMENT  NOTE  Date:  01/29/2016  Patient Details  Name: Mariah [name] MRN: [id number] Date of Birth: 01/28/1946  Clinical Social Work is seeking post-discharge placement for this patient at the [place] level of care (*CSW will initial, date and re-position this form in  chart as items are completed):  Yes   Patient/family provided with [place] Work Department's list of facilities offering this level of care within the geographic area requested by the patient (or if unable, by the patient's family).  Yes   Patient/family informed of their freedom to choose among providers that offer the needed level of care, that participate in Medicare, Medicaid or managed care program needed by the patient, have an available bed and are willing to accept the patient.  Yes   Patient/family informed of [facility]'s ownership interest in [facility] and [facility], as well as of the fact that they are under no obligation to receive care at these facilities.  PASRR submitted to EDS on 01/28/16     PASRR number received on 01/28/16     Existing PASRR number confirmed on       FL2 transmitted to all facilities in geographic area requested by pt/family on 01/28/16     FL2 transmitted to all facilities within larger geographic area on       Patient informed that his/her managed care company has contracts with or will negotiate with certain facilities, including the following:        Yes   Patient/family informed of bed offers received.  Patient chooses bed at  [facility] )     Physician recommends and patient chooses bed at      Patient to be transferred to  [facility] ) on 01/29/16.  Patient to be transferred to facility by  [facility] )     Patient family notified on 01/29/16 of transfer.  Name of family member notified:   (Patient's brother in law Rayvon is aware of D/C today. )     PHYSICIAN        Additional Comment:    _______________________________________________ Loralyn Freshwater, LCSW 01/29/2016, 10:47 AM

## 2016-01-29 NOTE — Care Management Important Message (Signed)
Important Message  Patient Details  Name: Mariah [name] MRN: [id number] Date of Birth: 10-20-1945   Medicare Important Message Given:  Yes    Juliann Pulse A Omaira Mellen 01/29/2016, 9:55 AM

## 2016-01-29 NOTE — Progress Notes (Signed)
Patient is medically stable for D/C to [facility] today. Per Kim admissions coordinator at [facility] patient will go to room 210-B. RN will call report at [phone] and arrange EMS for transport after 11:30 am. Health Team authorization has been received. Auth # [id number]. Clinical Education officer, museum (CSW) sent D/C Summary, FL2 and D/C Packet to [employer] via [employer]. Patient is aware of above. CSW contacted patient's brother in law Rayvon and made him aware of above. Please reconsult if future social work needs arise. CSW signing off.   Blima Rich, LCSW [phone]

## 2016-02-05 ENCOUNTER — Non-Acute Institutional Stay (SKILLED_NURSING_FACILITY): Payer: PPO | Admitting: Gerontology

## 2016-02-05 DIAGNOSIS — I739 Peripheral vascular disease, unspecified: Secondary | ICD-10-CM

## 2016-02-05 NOTE — Progress Notes (Signed)
Location:  [place] [facility] [employer] [facility] Service:  SNF [phone]) Provider:  Toni Arthurs, NP-C  Margarita Rana, MD  Patient Care Team: Margarita Rana, MD as PCP - General [facility])  Extended Emergency Contact Information Primary Emergency Contact: [name] Acre, [place] [address] Johnnette Litter of [place] Phone: [phone] Relation: Relative Secondary Emergency Contact: Donne Anon, [place] [address] Johnnette Litter of [place] Phone: [phone] Relation: Sister  Code Status:  Full Goals of care: Advanced Directive information Advanced Directives 01/27/2016  Does patient have an advance directive? No  Would patient like information on creating an advanced directive? No - patient declined information     Chief Complaint  Patient presents with  . Acute Visit    HPI:  Pt is a 70 y.o. female seen today for an acute visit for pain in the right foot. Pt is s/p RTHA, working well with PT. Pt c/o in the middle of the night last night, she was suddenly awoken by sudden onset severe burning sensation along with tingling and some numbness in right foot. Pt reports "it took all she had not to scream out." She reports the pain was unbearable for even a sheet to cover the foot. Pain eventually lightened.    Past Medical History  Diagnosis Date  . Hypertension   . Arthritis    Past Surgical History  Procedure Laterality Date  . Appendectomy  2000  . Foot surgery Bilateral   . 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 Joint replacement    . Total hip arthroplasty Right 01/27/2016    Procedure: TOTAL HIP ARTHROPLASTY;  Surgeon: Dereck Leep, MD;  Location: [facility] ORS;  Service: Orthopedics;  Laterality: Right;    No Known Allergies    Medication List       This list is accurate as of: 02/05/16 10:37 PM.  Always use your most recent med list.               betamethasone dipropionate 0.05 % ointment  Commonly known as:  DIPROLENE    Apply topically 2 (two) times daily.     colestipol 1 g tablet  Commonly known as:  COLESTID  Take 2 tablets by mouth daily.     diphenhydrAMINE 25 MG tablet  Commonly known as:  SOMINEX  Take 25 mg by mouth as needed for itching.     enoxaparin 30 MG/0.3ML injection  Commonly known as:  LOVENOX  Inject 0.3 mLs (30 mg total) into the skin every 12 (twelve) hours.     gabapentin 300 MG capsule  Commonly known as:  NEURONTIN  TAKE 1 CAPSULE (300 MG TOTAL) BY MOUTH 2 (TWO) TIMES DAILY.     hydrochlorothiazide 12.5 MG tablet  Commonly known as:  HYDRODIURIL  TAKE 1 TABLET EVERY DAY     meloxicam 15 MG tablet  Commonly known as:  MOBIC  Take 15 mg by mouth daily. with food     metoprolol succinate 50 MG 24 hr tablet  Commonly known as:  TOPROL-XL  TAKE 1 TABLET EVERY DAY     OTEZLA 30 MG Tabs  Generic drug:  Apremilast  Take 1 tablet by mouth 2 (two) times daily.     oxyCODONE 5 MG immediate release tablet  Commonly known as:  Oxy IR/ROXICODONE  Take 1-2 tablets (5-10 mg total) by mouth every 4 (four) hours  as needed for severe pain.     pantoprazole 20 MG tablet  Commonly known as:  PROTONIX  TAKE 1 TABLET BY MOUTH ONCE DAILY     polyethylene glycol powder powder  Commonly known as:  GLYCOLAX/MIRALAX  Take 17 g by mouth 2 (two) times daily as needed.     simvastatin 10 MG tablet  Commonly known as:  ZOCOR  TAKE 1 TABLET AT BEDTIME     traMADol 50 MG tablet  Commonly known as:  ULTRAM  TAKE 1 TABLET 4 TIMES A DAY AS NEEDED     traMADol 50 MG tablet  Commonly known as:  ULTRAM  Take 1-2 tablets (50-100 mg total) by mouth every 4 (four) hours as needed for moderate pain.     Vitamin D3 2000 units Tabs  Take 2,000 Units by mouth daily.        Review of Systems  Constitutional: Negative.   HENT: Negative.   Respiratory: Negative for cough, choking, chest tightness and shortness of breath.   Cardiovascular: Negative for chest pain and leg swelling.   Musculoskeletal: Positive for myalgias, arthralgias and gait problem.  Skin: Positive for color change.  Neurological: Positive for numbness.  All other systems reviewed and are negative.   Immunization History  Administered Date(s) Administered  . Influenza, High Dose Seasonal PF 06/24/2015  . Pneumococcal Conjugate-13 06/24/2015  . Pneumococcal Polysaccharide-23 03/26/2011  . Tdap 03/26/2011   Pertinent  Health Maintenance Due  Topic Date Due  . COLONOSCOPY  08/22/1994  . INFLUENZA VACCINE  02/18/2016  . MAMMOGRAM  07/07/2017  . DEXA SCAN  Completed  . PNA vac Low Risk Adult  Completed   Fall Risk  06/24/2015  Falls in the past year? No   Functional Status Survey:    Filed Vitals:   02/05/16 2234  BP: 139/57  Pulse: 93  Resp: 20  SpO2: 96%   There is no weight on file to calculate BMI. Physical Exam  Constitutional: She is oriented to person, place, and time. She appears well-developed and well-nourished. No distress.  Neck: Normal range of motion. No JVD present.  Pulmonary/Chest: Effort normal. Stridor present.  Musculoskeletal:       Right hip: She exhibits decreased range of motion (s/p RTHA) and decreased strength.  Neurological: She is alert and oriented to person, place, and time. A sensory deficit (mild- on B-feet; R>L difficulty distinguishing sharp/ dull sensations) is present.  Skin: Skin is warm, dry and intact. She is not diaphoretic. No cyanosis. Nails show no clubbing.     Mild vascular staining on BLE; Right foot to just proximal to ankle, coloring was dark maroon colored, warm to touch, tender/painful to touch when foot in the dependent position. Color returned to normal, less warmth, and minimal tenderness to touch when foot elevated. Weak B-posterior tibialis pulses. Brisk cap refills. Mild decreased ability to distinguish sharp/dull on B-feet: R>L    Labs reviewed:  Recent Labs  01/15/16 1329 01/28/16 0459 01/29/16 0421  NA 135 134* 134*   K 3.7 3.2* 4.0  CL 97* 100* 98*  CO2 29 29 30   GLUCOSE 103* 118* 121*  BUN 8 <5* 7  CREATININE 0.86 0.82 0.83  CALCIUM 9.5 8.3* 8.5*    Recent Labs  07/02/15 1101 11/12/15 1156 01/15/16 1329  AST 23 21 26   ALT 14 14 18   ALKPHOS 119* 130* 124  BILITOT 0.8 0.5 0.9  PROT 6.4 6.1 7.4  ALBUMIN 4.3 4.1 4.4    Recent  Labs  07/02/15 1101 11/12/15 1156 01/15/16 1329 01/28/16 0459 01/29/16 0421  WBC 6.9 6.0 5.7 7.6 10.0  NEUTROABS 4.8 4.5  --   --   --   HGB  --   --  15.4 12.8 13.9  HCT 44.6 43.1 43.5 36.3 40.0  MCV 91 90 86.6 86.7 87.2  PLT 191 189 153 128* 152   Lab Results  Component Value Date   TSH 2.640 07/02/2015   Lab Results  Component Value Date   HGBA1C 5.5 08/21/2013   Lab Results  Component Value Date   CHOL 193 10/12/2014   HDL 53 10/12/2014   LDLCALC 116 10/12/2014   TRIG 119 10/12/2014    Significant Diagnostic Results in last 30 days:  Dg Hip Port Unilat With Pelvis 1v Right  01/27/2016  CLINICAL DATA:  Post RIGHT total hip replacement EXAM: DG HIP (WITH OR WITHOUT PELVIS) 1V PORT RIGHT COMPARISON:  Portable exam 1038 hours compared to 12/26/2013 FINDINGS: RIGHT hip prosthesis identified. No fracture dislocation. Bones demineralized. Surgical drain present. IMPRESSION: RIGHT hip prosthesis without acute fracture or dislocation. Electronically Signed   By: Lavonia Dana M.D.   On: 01/27/2016 11:08    Assessment/Plan 1. Peripheral arterial disease (HCC)  Elevate legs when at rest  RLE duplex doppler  ABI study  Consider referral to vascular if no improvement  No TED hose on RLE  Family/ staff Communication:   Total Time: 45 minutes  Documentation: 20 minutes  Face to Face: 25 minutes  Family/Phone:   Labs/tests ordered:  RLE Duplex doppler, ABIs  Vikki Ports, NP-C [facility] [address], [place] [address] Cell Phone (Mon-Fri 8am-5pm):  [phone] On Call:   [phone] & follow prompts after 5pm & weekends Office Phone:  [phone] Office Fax:  [phone]

## 2016-02-07 DIAGNOSIS — I70219 Atherosclerosis of native arteries of extremities with intermittent claudication, unspecified extremity: Secondary | ICD-10-CM | POA: Diagnosis not present

## 2016-02-07 DIAGNOSIS — M79604 Pain in right leg: Secondary | ICD-10-CM | POA: Diagnosis not present

## 2016-02-18 ENCOUNTER — Encounter: Admission: RE | Admit: 2016-02-18 | Payer: PPO | Source: Ambulatory Visit | Admitting: Internal Medicine

## 2016-02-18 DIAGNOSIS — E785 Hyperlipidemia, unspecified: Secondary | ICD-10-CM | POA: Diagnosis not present

## 2016-02-18 DIAGNOSIS — Z79891 Long term (current) use of opiate analgesic: Secondary | ICD-10-CM | POA: Diagnosis not present

## 2016-02-18 DIAGNOSIS — Z96651 Presence of right artificial knee joint: Secondary | ICD-10-CM | POA: Diagnosis not present

## 2016-02-18 DIAGNOSIS — J449 Chronic obstructive pulmonary disease, unspecified: Secondary | ICD-10-CM | POA: Diagnosis not present

## 2016-02-18 DIAGNOSIS — Z7901 Long term (current) use of anticoagulants: Secondary | ICD-10-CM | POA: Diagnosis not present

## 2016-02-18 DIAGNOSIS — I739 Peripheral vascular disease, unspecified: Secondary | ICD-10-CM | POA: Diagnosis not present

## 2016-02-18 DIAGNOSIS — Z471 Aftercare following joint replacement surgery: Secondary | ICD-10-CM | POA: Diagnosis not present

## 2016-02-18 DIAGNOSIS — F17210 Nicotine dependence, cigarettes, uncomplicated: Secondary | ICD-10-CM | POA: Diagnosis not present

## 2016-02-18 DIAGNOSIS — I714 Abdominal aortic aneurysm, without rupture: Secondary | ICD-10-CM | POA: Diagnosis not present

## 2016-02-18 DIAGNOSIS — L4050 Arthropathic psoriasis, unspecified: Secondary | ICD-10-CM | POA: Diagnosis not present

## 2016-02-18 DIAGNOSIS — G2581 Restless legs syndrome: Secondary | ICD-10-CM | POA: Diagnosis not present

## 2016-02-18 DIAGNOSIS — I10 Essential (primary) hypertension: Secondary | ICD-10-CM | POA: Diagnosis not present

## 2016-02-18 DIAGNOSIS — M810 Age-related osteoporosis without current pathological fracture: Secondary | ICD-10-CM | POA: Diagnosis not present

## 2016-02-21 DIAGNOSIS — J449 Chronic obstructive pulmonary disease, unspecified: Secondary | ICD-10-CM | POA: Diagnosis not present

## 2016-02-21 DIAGNOSIS — Z471 Aftercare following joint replacement surgery: Secondary | ICD-10-CM | POA: Diagnosis not present

## 2016-02-21 DIAGNOSIS — L4050 Arthropathic psoriasis, unspecified: Secondary | ICD-10-CM | POA: Diagnosis not present

## 2016-02-21 DIAGNOSIS — I714 Abdominal aortic aneurysm, without rupture: Secondary | ICD-10-CM | POA: Diagnosis not present

## 2016-02-21 DIAGNOSIS — F17210 Nicotine dependence, cigarettes, uncomplicated: Secondary | ICD-10-CM | POA: Diagnosis not present

## 2016-02-21 DIAGNOSIS — E785 Hyperlipidemia, unspecified: Secondary | ICD-10-CM | POA: Diagnosis not present

## 2016-02-21 DIAGNOSIS — Z7901 Long term (current) use of anticoagulants: Secondary | ICD-10-CM | POA: Diagnosis not present

## 2016-02-21 DIAGNOSIS — Z96651 Presence of right artificial knee joint: Secondary | ICD-10-CM | POA: Diagnosis not present

## 2016-02-21 DIAGNOSIS — Z79891 Long term (current) use of opiate analgesic: Secondary | ICD-10-CM | POA: Diagnosis not present

## 2016-02-21 DIAGNOSIS — I739 Peripheral vascular disease, unspecified: Secondary | ICD-10-CM | POA: Diagnosis not present

## 2016-02-21 DIAGNOSIS — G2581 Restless legs syndrome: Secondary | ICD-10-CM | POA: Diagnosis not present

## 2016-02-21 DIAGNOSIS — I10 Essential (primary) hypertension: Secondary | ICD-10-CM | POA: Diagnosis not present

## 2016-02-21 DIAGNOSIS — M810 Age-related osteoporosis without current pathological fracture: Secondary | ICD-10-CM | POA: Diagnosis not present

## 2016-02-26 DIAGNOSIS — Z471 Aftercare following joint replacement surgery: Secondary | ICD-10-CM | POA: Diagnosis not present

## 2016-02-26 DIAGNOSIS — I739 Peripheral vascular disease, unspecified: Secondary | ICD-10-CM | POA: Diagnosis not present

## 2016-02-26 DIAGNOSIS — Z7901 Long term (current) use of anticoagulants: Secondary | ICD-10-CM | POA: Diagnosis not present

## 2016-02-26 DIAGNOSIS — Z79891 Long term (current) use of opiate analgesic: Secondary | ICD-10-CM | POA: Diagnosis not present

## 2016-02-26 DIAGNOSIS — G2581 Restless legs syndrome: Secondary | ICD-10-CM | POA: Diagnosis not present

## 2016-02-26 DIAGNOSIS — E785 Hyperlipidemia, unspecified: Secondary | ICD-10-CM | POA: Diagnosis not present

## 2016-02-26 DIAGNOSIS — Z96651 Presence of right artificial knee joint: Secondary | ICD-10-CM | POA: Diagnosis not present

## 2016-02-26 DIAGNOSIS — I10 Essential (primary) hypertension: Secondary | ICD-10-CM | POA: Diagnosis not present

## 2016-02-26 DIAGNOSIS — M810 Age-related osteoporosis without current pathological fracture: Secondary | ICD-10-CM | POA: Diagnosis not present

## 2016-02-26 DIAGNOSIS — F17210 Nicotine dependence, cigarettes, uncomplicated: Secondary | ICD-10-CM | POA: Diagnosis not present

## 2016-02-26 DIAGNOSIS — I714 Abdominal aortic aneurysm, without rupture: Secondary | ICD-10-CM | POA: Diagnosis not present

## 2016-02-26 DIAGNOSIS — L4050 Arthropathic psoriasis, unspecified: Secondary | ICD-10-CM | POA: Diagnosis not present

## 2016-02-26 DIAGNOSIS — J449 Chronic obstructive pulmonary disease, unspecified: Secondary | ICD-10-CM | POA: Diagnosis not present

## 2016-03-02 DIAGNOSIS — Z7901 Long term (current) use of anticoagulants: Secondary | ICD-10-CM | POA: Diagnosis not present

## 2016-03-02 DIAGNOSIS — Z471 Aftercare following joint replacement surgery: Secondary | ICD-10-CM | POA: Diagnosis not present

## 2016-03-02 DIAGNOSIS — I10 Essential (primary) hypertension: Secondary | ICD-10-CM | POA: Diagnosis not present

## 2016-03-02 DIAGNOSIS — J449 Chronic obstructive pulmonary disease, unspecified: Secondary | ICD-10-CM | POA: Diagnosis not present

## 2016-03-02 DIAGNOSIS — M810 Age-related osteoporosis without current pathological fracture: Secondary | ICD-10-CM | POA: Diagnosis not present

## 2016-03-02 DIAGNOSIS — I739 Peripheral vascular disease, unspecified: Secondary | ICD-10-CM | POA: Diagnosis not present

## 2016-03-02 DIAGNOSIS — L4050 Arthropathic psoriasis, unspecified: Secondary | ICD-10-CM | POA: Diagnosis not present

## 2016-03-02 DIAGNOSIS — F17210 Nicotine dependence, cigarettes, uncomplicated: Secondary | ICD-10-CM | POA: Diagnosis not present

## 2016-03-02 DIAGNOSIS — Z96651 Presence of right artificial knee joint: Secondary | ICD-10-CM | POA: Diagnosis not present

## 2016-03-02 DIAGNOSIS — I714 Abdominal aortic aneurysm, without rupture: Secondary | ICD-10-CM | POA: Diagnosis not present

## 2016-03-02 DIAGNOSIS — G2581 Restless legs syndrome: Secondary | ICD-10-CM | POA: Diagnosis not present

## 2016-03-02 DIAGNOSIS — Z79891 Long term (current) use of opiate analgesic: Secondary | ICD-10-CM | POA: Diagnosis not present

## 2016-03-02 DIAGNOSIS — E785 Hyperlipidemia, unspecified: Secondary | ICD-10-CM | POA: Diagnosis not present

## 2016-03-05 ENCOUNTER — Other Ambulatory Visit: Payer: Self-pay | Admitting: Family Medicine

## 2016-03-06 NOTE — Telephone Encounter (Signed)
Called in. Emily Drozdowski, CMA  

## 2016-03-06 NOTE — Telephone Encounter (Signed)
Please call in tramadol.  

## 2016-03-09 DIAGNOSIS — F17210 Nicotine dependence, cigarettes, uncomplicated: Secondary | ICD-10-CM | POA: Diagnosis not present

## 2016-03-09 DIAGNOSIS — E785 Hyperlipidemia, unspecified: Secondary | ICD-10-CM | POA: Diagnosis not present

## 2016-03-09 DIAGNOSIS — I714 Abdominal aortic aneurysm, without rupture: Secondary | ICD-10-CM | POA: Diagnosis not present

## 2016-03-09 DIAGNOSIS — M810 Age-related osteoporosis without current pathological fracture: Secondary | ICD-10-CM | POA: Diagnosis not present

## 2016-03-09 DIAGNOSIS — Z7901 Long term (current) use of anticoagulants: Secondary | ICD-10-CM | POA: Diagnosis not present

## 2016-03-09 DIAGNOSIS — L4050 Arthropathic psoriasis, unspecified: Secondary | ICD-10-CM | POA: Diagnosis not present

## 2016-03-09 DIAGNOSIS — I739 Peripheral vascular disease, unspecified: Secondary | ICD-10-CM | POA: Diagnosis not present

## 2016-03-09 DIAGNOSIS — J449 Chronic obstructive pulmonary disease, unspecified: Secondary | ICD-10-CM | POA: Diagnosis not present

## 2016-03-09 DIAGNOSIS — I10 Essential (primary) hypertension: Secondary | ICD-10-CM | POA: Diagnosis not present

## 2016-03-09 DIAGNOSIS — G2581 Restless legs syndrome: Secondary | ICD-10-CM | POA: Diagnosis not present

## 2016-03-09 DIAGNOSIS — Z471 Aftercare following joint replacement surgery: Secondary | ICD-10-CM | POA: Diagnosis not present

## 2016-03-09 DIAGNOSIS — Z96651 Presence of right artificial knee joint: Secondary | ICD-10-CM | POA: Diagnosis not present

## 2016-03-09 DIAGNOSIS — Z79891 Long term (current) use of opiate analgesic: Secondary | ICD-10-CM | POA: Diagnosis not present

## 2016-03-10 DIAGNOSIS — Z96641 Presence of right artificial hip joint: Secondary | ICD-10-CM | POA: Diagnosis not present

## 2016-03-16 ENCOUNTER — Ambulatory Visit (INDEPENDENT_AMBULATORY_CARE_PROVIDER_SITE_OTHER): Payer: PPO | Admitting: Family Medicine

## 2016-03-16 ENCOUNTER — Encounter: Payer: Self-pay | Admitting: Family Medicine

## 2016-03-16 VITALS — BP 120/78 | HR 80 | Temp 97.8°F | Resp 16 | Wt 162.0 lb

## 2016-03-16 DIAGNOSIS — N2889 Other specified disorders of kidney and ureter: Secondary | ICD-10-CM

## 2016-03-16 DIAGNOSIS — K219 Gastro-esophageal reflux disease without esophagitis: Secondary | ICD-10-CM

## 2016-03-16 DIAGNOSIS — K8020 Calculus of gallbladder without cholecystitis without obstruction: Secondary | ICD-10-CM

## 2016-03-16 DIAGNOSIS — Z23 Encounter for immunization: Secondary | ICD-10-CM | POA: Diagnosis not present

## 2016-03-16 MED ORDER — PANTOPRAZOLE SODIUM 40 MG PO TBEC: 40.0000 mg | DELAYED_RELEASE_TABLET | Freq: Every day | ORAL | 1 refills | Status: DC

## 2016-03-16 NOTE — Progress Notes (Signed)
Patient: Mariah [name] Female    DOB: [birth year]   70 y.o.   MRN: [id number] Visit Date: 03/16/2016  Today's Provider: Lelon Huh, MD   Chief Complaint  Patient presents with  . Follow-up    Renal Mass recheck   Subjective:    HPI   This is a previous patient of Dr. Venia Minks present today as new patient to me to establish care and follow up on chronic medical problems.   Follow up Left Renal Mass: Patient was last seen for this problem 2 months ago by Dr. Venia Minks. This was incidental finding on workup for diarrhea.  It was recommended by radiologist to follow up with renal Protocol MRI or CT with contrast. Patient elected to postpone study until after hip replacement which was done in July.She feels like she is recovering well from surgery.  She was started on Colestid for diarrhea, which she states she could not take due to large size of pills. However diarrhea has since completely resolved and she is having normal bowel movement. s    No Known Allergies Current Meds  Medication Sig  . Apremilast (OTEZLA) 30 MG TABS Take 1 tablet by mouth 2 (two) times daily.  [name] aspirin 81 MG EC tablet Take 81 mg by mouth 2 (two) times daily. Swallow whole.  . betamethasone dipropionate (DIPROLENE) 0.05 % ointment Apply topically 2 (two) times daily.  . Cholecalciferol (VITAMIN D3) 2000 units TABS Take 2,000 Units by mouth daily.  . colestipol (COLESTID) 1 g tablet Take 2 tablets by mouth daily.   . diphenhydrAMINE (SOMINEX) 25 MG tablet Take 25 mg by mouth as needed for itching.   . gabapentin (NEURONTIN) 300 MG capsule TAKE 1 CAPSULE (300 MG TOTAL) BY MOUTH 2 (TWO) TIMES DAILY.  . hydrochlorothiazide (HYDRODIURIL) 12.5 MG tablet TAKE 1 TABLET EVERY DAY  . metoprolol succinate (TOPROL-XL) 50 MG 24 hr tablet TAKE 1 TABLET EVERY DAY  . pantoprazole (PROTONIX) 20 MG tablet TAKE 1 TABLET BY MOUTH ONCE DAILY  . polyethylene glycol powder (GLYCOLAX/MIRALAX) powder Take 17 g by mouth 2 (two)  times daily as needed.  . simvastatin (ZOCOR) 10 MG tablet TAKE 1 TABLET AT BEDTIME  . traMADol (ULTRAM) 50 MG tablet TAKE 1 TABLET 4 TIMES A DAY AS NEEDED (Patient taking differently: Take 50 mg by mouth 4 (four) times daily. )  . [DISCONTINUED] enoxaparin (LOVENOX) 30 MG/0.3ML injection Inject 0.3 mLs (30 mg total) into the skin every 12 (twelve) hours.  . [DISCONTINUED] meloxicam (MOBIC) 15 MG tablet Take 15 mg by mouth daily. with food  . [DISCONTINUED] oxyCODONE (OXY IR/ROXICODONE) 5 MG immediate release tablet Take 1-2 tablets (5-10 mg total) by mouth every 4 (four) hours as needed for severe pain.  . [DISCONTINUED] traMADol (ULTRAM) 50 MG tablet Take 1-2 tablets (50-100 mg total) by mouth every 4 (four) hours as needed for moderate pain.  . [DISCONTINUED] traMADol (ULTRAM) 50 MG tablet TAKE 1 TABLET BY MOUTH 4 TIMES A DAY AS NEEDED    Review of Systems  Constitutional: Negative for appetite change, chills, fatigue and fever.  Respiratory: Negative for chest tightness and shortness of breath.   Cardiovascular: Negative for chest pain and palpitations.  Gastrointestinal: Negative for abdominal pain, nausea and vomiting.  Neurological: Negative for dizziness and weakness.    Social History  Substance Use Topics  . Smoking status: Current Every Day Smoker    Packs/day: 1.00    Years: 0.00  . Smokeless tobacco:  Never Used  . Alcohol use Yes     Comment: occasionally glass of wine 1 per month.   Objective:   BP 120/78 (BP Location: Right Arm, Patient Position: Sitting, Cuff Size: Normal)   Pulse 80   Temp 97.8 F (36.6 C) (Oral)   Resp 16   Wt 162 lb (73.5 kg)   SpO2 96% Comment: room air  BMI 29.63 kg/m   Physical Exam  General Appearance:    Alert, cooperative, no distress, obese, ambulating with walker  Eyes:    PERRL, conjunctiva/corneas clear, EOM's intact       Lungs:     Clear to auscultation bilaterally, respirations unlabored  Heart:    Regular rate and rhythm    Neurologic:   Awake, alert, oriented x 3. No apparent focal neurological           defect.           Assessment & Plan:     1. Renal mass Incidental finding on abdominal ultrasound.  - CT Abdomen Pelvis W Contrast; Future  2. Calculus of gallbladder without cholecystitis without obstruction Asymptomatic. Another incidental finding.   3. Gastro-esophageal reflux disease without esophagitis She states she has been getting heartburn in the afternoon and feels that protonix is wearing off. Will try increasing dose from 20 to 40mg  for the next month.   4. Flu vaccine need  - Flu vaccine HIGH DOSE PF  Return in about 6 months (around 09/16/2016).     The entirety of the information documented in the History of Present Illness, Review of Systems and Physical Exam were personally obtained by me. Portions of this information were initially documented by Meyer Cory, CMA and reviewed by me for thoroughness and accuracy.    Lelon Huh, MD  [facility]

## 2016-03-25 ENCOUNTER — Ambulatory Visit: Payer: PPO

## 2016-04-06 ENCOUNTER — Ambulatory Visit: Payer: PPO

## 2016-04-15 ENCOUNTER — Ambulatory Visit
Admission: RE | Admit: 2016-04-15 | Discharge: 2016-04-15 | Disposition: A | Discharge status: Home / Self Care | Payer: PPO | Source: Ambulatory Visit | Attending: Family Medicine | Admitting: Family Medicine

## 2016-04-15 DIAGNOSIS — N2889 Other specified disorders of kidney and ureter: Secondary | ICD-10-CM | POA: Insufficient documentation

## 2016-04-15 DIAGNOSIS — R935 Abnormal findings on diagnostic imaging of other abdominal regions, including retroperitoneum: Secondary | ICD-10-CM | POA: Diagnosis not present

## 2016-04-15 DIAGNOSIS — I700 Atherosclerosis of aorta: Secondary | ICD-10-CM | POA: Diagnosis not present

## 2016-04-15 LAB — POCT I-STAT CREATININE: CREATININE: 1 mg/dL (ref 0.44–1.00)

## 2016-04-15 MED ORDER — IOPAMIDOL (ISOVUE-370) INJECTION 76%
100.0000 mL | Freq: Once | INTRAVENOUS | Status: AC | PRN
  Administered 2016-04-15: 100 mL via INTRAVENOUS

## 2016-04-16 ENCOUNTER — Encounter: Payer: Self-pay | Admitting: Family Medicine

## 2016-04-17 ENCOUNTER — Telehealth: Payer: Self-pay

## 2016-04-17 DIAGNOSIS — I719 Aortic aneurysm of unspecified site, without rupture: Secondary | ICD-10-CM

## 2016-04-17 NOTE — Telephone Encounter (Signed)
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 These are common and completely benign. Also shows small aortic aneurysm which needs to be followed by vascular surgery. Please advise and refer vascular surgery.

## 2016-04-17 NOTE — Telephone Encounter (Signed)
Ordered. Mariah [name], CMA  

## 2016-04-17 NOTE — Telephone Encounter (Signed)
Patient has been advised please proceed with referral to vascular surgery

## 2016-04-17 NOTE — Telephone Encounter (Signed)
Please add referral to EPIC °

## 2016-04-27 DIAGNOSIS — K529 Noninfective gastroenteritis and colitis, unspecified: Secondary | ICD-10-CM | POA: Diagnosis not present

## 2016-04-27 DIAGNOSIS — Z8371 Family history of colonic polyps: Secondary | ICD-10-CM | POA: Diagnosis not present

## 2016-04-27 DIAGNOSIS — R1310 Dysphagia, unspecified: Secondary | ICD-10-CM | POA: Diagnosis not present

## 2016-04-27 DIAGNOSIS — R748 Abnormal levels of other serum enzymes: Secondary | ICD-10-CM | POA: Diagnosis not present

## 2016-04-27 DIAGNOSIS — R768 Other specified abnormal immunological findings in serum: Secondary | ICD-10-CM | POA: Diagnosis not present

## 2016-04-27 DIAGNOSIS — Z87898 Personal history of other specified conditions: Secondary | ICD-10-CM | POA: Diagnosis not present

## 2016-04-28 ENCOUNTER — Other Ambulatory Visit: Payer: Self-pay | Admitting: *Deleted

## 2016-04-28 MED ORDER — PANTOPRAZOLE SODIUM 40 MG PO TBEC: 40.0000 mg | DELAYED_RELEASE_TABLET | Freq: Every day | ORAL | 4 refills | Status: DC

## 2016-04-28 NOTE — Telephone Encounter (Signed)
Requesting 90 day supply.

## 2016-05-04 DIAGNOSIS — K529 Noninfective gastroenteritis and colitis, unspecified: Secondary | ICD-10-CM | POA: Diagnosis not present

## 2016-05-04 DIAGNOSIS — R768 Other specified abnormal immunological findings in serum: Secondary | ICD-10-CM | POA: Diagnosis not present

## 2016-05-04 DIAGNOSIS — R748 Abnormal levels of other serum enzymes: Secondary | ICD-10-CM | POA: Diagnosis not present

## 2016-05-05 ENCOUNTER — Ambulatory Visit (INDEPENDENT_AMBULATORY_CARE_PROVIDER_SITE_OTHER): Payer: PPO | Admitting: Vascular Surgery

## 2016-05-05 ENCOUNTER — Encounter (INDEPENDENT_AMBULATORY_CARE_PROVIDER_SITE_OTHER): Payer: Self-pay | Admitting: Vascular Surgery

## 2016-05-05 VITALS — BP 136/90 | HR 101 | Resp 16 | Ht 63.5 in | Wt 160.0 lb

## 2016-05-05 DIAGNOSIS — I714 Abdominal aortic aneurysm, without rupture, unspecified: Secondary | ICD-10-CM

## 2016-05-05 DIAGNOSIS — Z720 Tobacco use: Secondary | ICD-10-CM | POA: Diagnosis not present

## 2016-05-05 DIAGNOSIS — E7800 Pure hypercholesterolemia, unspecified: Secondary | ICD-10-CM

## 2016-05-05 DIAGNOSIS — I739 Peripheral vascular disease, unspecified: Secondary | ICD-10-CM | POA: Diagnosis not present

## 2016-05-05 DIAGNOSIS — I10 Essential (primary) hypertension: Secondary | ICD-10-CM | POA: Diagnosis not present

## 2016-05-05 NOTE — Assessment & Plan Note (Signed)
We discussed the absolute need for smoking cessation due to the deleterious nature of tobacco on the vascular system. We discussed the tobacco use would diminish patency of any intervention, and likely significantly worsen progressio of disease. We discussed multiple agents for quitting including replacement therapy or medications to reduce cravings such as Chantix. The patient voices their understanding of the importance of smoking cessation. 

## 2016-05-05 NOTE — Assessment & Plan Note (Signed)
blood pressure control important in reducing the progression of atherosclerotic disease. On appropriate oral medications.  

## 2016-05-05 NOTE — Assessment & Plan Note (Signed)
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 She does not have any current symptoms related to her aneurysm. At this size, the aneurysm poses her very small risk. This does need to be monitored. This should be checked again in 1 year for further evaluation. Blood pressure control and smoking cessation was strongly recommended.

## 2016-05-05 NOTE — Progress Notes (Signed)
Patient ID: Mariah [name], female   DOB: 04-16-1946, 70 y.o.   MRN: [id number]  Chief Complaint  Patient presents with  . New Evaluation  . AAA    HPI Mariah [name] is a 70 y.o. female.  I am asked to see the patient by Dr. Caryn Section for evaluation of of an abdominal aortic aneurysm.  The patient reports this was found on incidental finding on ultrasound earlier this year for other issues. She reports no obvious aneurysm related symptoms. Specifically, the patient denies new back or abdominal pain, or signs of peripheral embolization.  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 I have reviewed the studies.   Past Medical History:  Diagnosis Date  . Arthritis   . Hypertension     Past Surgical History:  Procedure Laterality Date  . APPENDECTOMY  2000  . FOOT SURGERY Bilateral   . JOINT REPLACEMENT    . 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 Cancer Mother   . Arthritis Father   . Heart disease Father   . COPD Sister   . Fibromyalgia Sister   . Osteoarthritis Sister      Social History Social History  Substance Use Topics  . Smoking status: Current Every Day Smoker    Packs/day: 1.00    Years: 0.00  . Smokeless tobacco: Never Used  . Alcohol use Yes     Comment: occasionally glass of wine 1 per month.  No IV drug use  No Known Allergies  Current Outpatient Prescriptions  Medication Sig Dispense Refill  . Apremilast (OTEZLA) 30 MG TABS Take 1 tablet by mouth 2 (two) times daily.    [name] aspirin 81 MG EC tablet Take 81 mg by mouth 2 (two) times daily. Swallow whole.    . betamethasone dipropionate (DIPROLENE) 0.05 % ointment Apply topically 2 (two) times daily. 45 g 5    . Cholecalciferol (VITAMIN D3) 2000 units TABS Take 2,000 Units by mouth daily.    . diphenhydrAMINE (SOMINEX) 25 MG tablet Take 25 mg by mouth as needed for itching.     . gabapentin (NEURONTIN) 300 MG capsule TAKE 1 CAPSULE (300 MG TOTAL) BY MOUTH 2 (TWO) TIMES DAILY. 180 capsule 1  . hydrochlorothiazide (HYDRODIURIL) 12.5 MG tablet TAKE 1 TABLET EVERY DAY 90 tablet 3  . metoprolol succinate (TOPROL-XL) 50 MG 24 hr tablet TAKE 1 TABLET EVERY DAY 90 tablet 3  . pantoprazole (PROTONIX) 40 MG tablet Take 1 tablet (40 mg total) by mouth daily. 90 tablet 4  . polyethylene glycol powder (GLYCOLAX/MIRALAX) powder Take 17 g by mouth 2 (two) times daily as needed. 3350 g 1  . simvastatin (ZOCOR) 10 MG tablet TAKE 1 TABLET AT BEDTIME 90 tablet 3  . traMADol (ULTRAM) 50 MG tablet TAKE 1 TABLET 4 TIMES A DAY AS NEEDED (Patient taking differently: Take 50 mg by mouth 4 (four) times daily. ) 100 tablet 5   No current facility-administered medications for this visit.       REVIEW OF SYSTEMS (Negative unless checked)  Constitutional: [] Weight loss  [] Fever  [] Chills Cardiac: [] Chest pain   [] Chest pressure   [] Palpitations   []   Shortness of breath when laying flat   [] Shortness of breath at rest   [] Shortness of breath with exertion. Vascular:  [x] Pain in legs with walking   [] Pain in legs at rest   [] Pain in legs when laying flat   [] Claudication   [] Pain in feet when walking  [] Pain in feet at rest  [] Pain in feet when laying flat   [] History of DVT   [] Phlebitis   [] Swelling in legs   [] Varicose veins   [] Non-healing ulcers Pulmonary:   [] Uses home oxygen   [x] Productive cough   [] Hemoptysis   [] Wheeze  [x] COPD   [] Asthma Neurologic:  [] Dizziness  [] Blackouts   [] Seizures   [] History of stroke   [] History of TIA  [] Aphasia   [] Temporary blindness   [] Dysphagia   [] Weakness or numbness in arms   [] Weakness or numbness in legs Musculoskeletal:  [] Arthritis   [] Joint swelling   [x] Joint pain   [] Low back  pain Hematologic:  [] Easy bruising  [] Easy bleeding   [] Hypercoagulable state   [] Anemic  [] Hepatitis Gastrointestinal:  [] Blood in stool   [] Vomiting blood  [] Gastroesophageal reflux/heartburn   [] Abdominal pain Genitourinary:  [] Chronic kidney disease   [] Difficult urination  [] Frequent urination  [] Burning with urination   [] Hematuria Skin:  [] Rashes   [] Ulcers   [] Wounds Psychological:  [] History of anxiety   []  History of major depression.    Physical Exam BP 136/90 (BP Location: Right Arm)   Pulse (!) 101   Resp 16   Ht 5' 3.5" (1.613 m)   Wt 160 lb (72.6 kg)   BMI 27.90 kg/m  Gen:  WD/WN, NAD. Appears older than stated age Head: [place]/AT, No temporalis wasting. Prominent temp pulse not noted. Ear/Nose/Throat: Hearing grossly intact, nares w/o erythema or drainage, oropharynx w/o Erythema/Exudate Eyes: PERRLA, EOMI.  Neck: Supple, no nuchal rigidity.  No JVD.  Pulmonary:  Good air movement, no use of accessory muscles  Cardiac: RRR, normal S1, S2 Vascular:  Vessel Right Left  Radial Palpable Palpable  Ulnar Palpable Palpable  Brachial Palpable Palpable  Carotid Palpable, without bruit Palpable, without bruit  Aorta palpable N/A  Femoral Palpable Palpable  Popliteal Palpable Palpable  PT Palpable 1+ Palpable  DP Not Palpable Not Palpable   Gastrointestinal: soft, non-tender/non-distended. No guarding/reflex. No masses, surgical incisions, or scars. Musculoskeletal: M/S 5/5 throughout.  Extremities without ischemic changes.  No deformity or atrophy. Mild bilateral lower extremity edema. Diffuse varicosities worse on the left leg than the right with stasis changes present more on the left than the right Neurologic: CN 2-12 intact. Pain and light touch intact in extremities.  Symmetrical.  Speech is fluent. Motor exam as listed above. Psychiatric: Judgment intact, Mood & affect appropriate for pt's clinical situation. Dermatologic: No rashes or ulcers noted.  No cellulitis or  open wounds. Lymph : No Cervical, Axillary, or Inguinal lymphadenopathy.   Radiology Ct Renal Abd W/wo  Result Date: 04/15/2016 CLINICAL DATA:  Indeterminate LEFT super renal or renal lesion on ultrasound. CT recommended further evaluation. EXAM: CT ABDOMEN AND PELVIS WITHOUT AND WITH CONTRAST TECHNIQUE: Multidetector CT imaging of the abdomen and pelvis was performed following the standard protocol before and following the bolus administration of intravenous contrast. CONTRAST:  100 mL Isovue 370 COMPARISON:  Ultrasound 11/28/2015 FINDINGS: Lower chest: Lung bases are clear. Hepatobiliary: No focal hepatic lesion. No biliary duct dilatation. Gallbladder is normal. Common bile duct is normal. Pancreas: Pancreas is normal. No ductal dilatation. No pancreatic inflammation. Spleen: Normal spleen Adrenals/urinary  tract: Adrenal glands are normal. No enhancing renal lesion. No nephrolithiasis or ureterolithiasis. Adjacent to the upper pole of the LEFT kidney is a rounded 2 cm splenule which corresponds to the ultrasound abnormality Stomach/Bowel: Stomach and limited view of the bowel is unremarkable. Vascular/Lymphatic: Mild atherosclerotic calcification aorta. Small saccular aneurysm of the abdominal aorta measures 2.5 cm. No upper abdominal adenopathy. Other: No free fluid. Musculoskeletal: No aggressive osseous lesion. IMPRESSION: 1. Benign splenule adjacent to the LEFT kidney corresponds to the indeterminate ultrasound lesion. 2.  Atherosclerotic calcification of the aorta. Electronically Signed   By: Suzy Bouchard M.D.   On: 04/15/2016 15:59    Labs Recent Results (from the past 2160 hour(s))  I-STAT creatinine     Status: None   Collection Time: 04/15/16  1:58 PM  Result Value Ref Range   Creatinine, Ser 1.00 0.44 - 1.00 mg/dL    Assessment/Plan:  Current tobacco use We discussed the absolute need for smoking cessation due to the deleterious nature of tobacco on the vascular system. We  discussed the tobacco use would diminish patency of any intervention, and likely significantly worsen progressio of disease. We discussed multiple agents for quitting including replacement therapy or medications to reduce cravings such as Chantix. The patient voices their understanding of the importance of smoking cessation.   Essential (primary) hypertension blood pressure control important in reducing the progression of atherosclerotic disease. On appropriate oral medications.   Peripheral arterial disease (HCC) No current limb threatening symptoms. Can check her ABIs as needed in the future.  Abdominal aortic aneurysm (AAA) ([place]) The patient has between a 2.5 and 3 cm saccular abdominal aortic aneurysm seen both on ultrasound and CT scan earlier this year. She does not have any current symptoms related to her aneurysm. At this size, the aneurysm poses her very small risk. This does need to be monitored. This should be checked again in 1 year for further evaluation. Blood pressure control and smoking cessation was strongly recommended.  Hypercholesteremia lipid control important in reducing the progression of atherosclerotic disease. Continue statin therapy       Leotis Pain 05/05/2016, 4:13 PM   This note was created with Dragon medical transcription system.  Any errors from dictation are unintentional.

## 2016-05-05 NOTE — Assessment & Plan Note (Signed)
lipid control important in reducing the progression of atherosclerotic disease. Continue statin therapy  

## 2016-05-05 NOTE — Patient Instructions (Signed)
Abdominal Aortic Aneurysm An aneurysm is a weakened or damaged part of an artery wall that bulges from the normal force of blood pumping through the body. An abdominal aortic aneurysm is an aneurysm that occurs in the lower part of the aorta, the main artery of the body.  The major concern with an abdominal aortic aneurysm is that it can enlarge and burst (rupture) or blood can flow between the layers of the wall of the aorta through a tear (aorticdissection). Both of these conditions can cause bleeding inside the body and can be life threatening unless diagnosed and treated promptly. CAUSES  The exact cause of an abdominal aortic aneurysm is unknown. Some contributing factors are:   A hardening of the arteries caused by the buildup of fat and other substances in the lining of a blood vessel (arteriosclerosis).  Inflammation of the walls of an artery (arteritis).   Connective tissue diseases, such as Marfan syndrome.   Abdominal trauma.   An infection, such as syphilis or staphylococcus, in the wall of the aorta (infectious aortitis) caused by bacteria. RISK FACTORS  Risk factors that contribute to an abdominal aortic aneurysm may include:  Age older than 60 years.   High blood pressure (hypertension).  Female gender.  Ethnicity (white race).  Obesity.  Family history of aneurysm (first degree relatives only).  Tobacco use. PREVENTION  The following healthy lifestyle habits may help decrease your risk of abdominal aortic aneurysm:  Quitting smoking. Smoking can raise your blood pressure and cause arteriosclerosis.  Limiting or avoiding alcohol.  Keeping your blood pressure, blood sugar level, and cholesterol levels within normal limits.  Decreasing your salt intake. In somepeople, too much salt can raise blood pressure and increase your risk of abdominal aortic aneurysm.  Eating a diet low in saturated fats and cholesterol.  Increasing your fiber intake by including  whole grains, vegetables, and fruits in your diet. Eating these foods may help lower blood pressure.  Maintaining a healthy weight.  Staying physically active and exercising regularly. SYMPTOMS  The symptoms of abdominal aortic aneurysm may vary depending on the size and rate of growth of the aneurysm.Most grow slowly and do not have any symptoms. When symptoms do occur, they may include:  Pain (abdomen, side, lower back, or groin). The pain may vary in intensity. A sudden onset of severe pain may indicate that the aneurysm has ruptured.  Feeling full after eating only small amounts of food.  Nausea or vomiting or both.  Feeling a pulsating lump in the abdomen.  Feeling faint or passing out. DIAGNOSIS  Since most unruptured abdominal aortic aneurysms have no symptoms, they are often discovered during diagnostic exams for other conditions. An aneurysm may be found during the following procedures:  Ultrasonography (A one-time screening for abdominal aortic aneurysm by ultrasonography is also recommended for all men aged 65-75 years who have ever smoked).  X-ray exams.  A computed tomography (CT).  Magnetic resonance imaging (MRI).  Angiography or arteriography. TREATMENT  Treatment of an abdominal aortic aneurysm depends on the size of your aneurysm, your age, and risk factors for rupture. Medication to control blood pressure and pain may be used to manage aneurysms smaller than 6 cm. Regular monitoring for enlargement may be recommended by your caregiver if:  The aneurysm is 3-4 cm in size (an annual ultrasonography may be recommended).  The aneurysm is 4-4.5 cm in size (an ultrasonography every 6 months may be recommended).  The aneurysm is larger than 4.5 cm in   size (your caregiver may ask that you be examined by a vascular surgeon). If your aneurysm is larger than 6 cm, surgical repair may be recommended. There are two main methods for repair of an aneurysm:   Endovascular  repair (a minimally invasive surgery). This is done most often.  Open repair. This method is used if an endovascular repair is not possible.   This information is not intended to replace advice given to you by your health care provider. Make sure you discuss any questions you have with your health care provider.   Document Released: 04/15/2005 Document Revised: 10/31/2012 Document Reviewed: 08/05/2012 Elsevier Interactive Patient Education 2016 [employer] Inc[employer]  

## 2016-05-05 NOTE — Assessment & Plan Note (Signed)
No current limb threatening symptoms. Can check her ABIs as needed in the future.

## 2016-05-06 ENCOUNTER — Other Ambulatory Visit: Payer: Self-pay | Admitting: Family Medicine

## 2016-05-06 DIAGNOSIS — M1990 Unspecified osteoarthritis, unspecified site: Secondary | ICD-10-CM

## 2016-05-06 NOTE — Telephone Encounter (Signed)
Please call in tramadol.  

## 2016-05-07 NOTE — Telephone Encounter (Signed)
Rx called in to pharmacy. 

## 2016-05-11 DIAGNOSIS — M810 Age-related osteoporosis without current pathological fracture: Secondary | ICD-10-CM | POA: Diagnosis not present

## 2016-05-11 DIAGNOSIS — M1611 Unilateral primary osteoarthritis, right hip: Secondary | ICD-10-CM | POA: Diagnosis not present

## 2016-05-11 DIAGNOSIS — L4050 Arthropathic psoriasis, unspecified: Secondary | ICD-10-CM | POA: Diagnosis not present

## 2016-05-16 ENCOUNTER — Other Ambulatory Visit: Payer: Self-pay | Admitting: Family Medicine

## 2016-07-06 ENCOUNTER — Other Ambulatory Visit: Payer: Self-pay | Admitting: Family Medicine

## 2016-07-06 DIAGNOSIS — M1990 Unspecified osteoarthritis, unspecified site: Secondary | ICD-10-CM

## 2016-07-06 NOTE — Telephone Encounter (Signed)
LAST OV 03/16/16 LAST FILLED 05/06/16

## 2016-07-06 NOTE — Telephone Encounter (Signed)
rx called into [facility] 

## 2016-07-06 NOTE — Telephone Encounter (Signed)
Please call in Fioricet.  

## 2016-07-28 DIAGNOSIS — M5416 Radiculopathy, lumbar region: Secondary | ICD-10-CM | POA: Diagnosis not present

## 2016-07-28 DIAGNOSIS — Z96641 Presence of right artificial hip joint: Secondary | ICD-10-CM | POA: Diagnosis not present

## 2016-08-07 ENCOUNTER — Encounter: Payer: Self-pay | Admitting: *Deleted

## 2016-08-10 ENCOUNTER — Encounter
Admission: RE | Disposition: A | Discharge status: Home / Self Care | Payer: Self-pay | Source: Ambulatory Visit | Attending: Unknown Physician Specialty

## 2016-08-10 ENCOUNTER — Ambulatory Visit: Payer: PPO | Admitting: Certified Registered Nurse Anesthetist

## 2016-08-10 ENCOUNTER — Encounter: Payer: Self-pay | Admitting: *Deleted

## 2016-08-10 ENCOUNTER — Ambulatory Visit
Admission: RE | Admit: 2016-08-10 | Discharge: 2016-08-10 | Disposition: A | Discharge status: Home / Self Care | Payer: PPO | Source: Ambulatory Visit | Attending: Unknown Physician Specialty | Admitting: Unknown Physician Specialty

## 2016-08-10 DIAGNOSIS — K222 Esophageal obstruction: Secondary | ICD-10-CM | POA: Insufficient documentation

## 2016-08-10 DIAGNOSIS — I739 Peripheral vascular disease, unspecified: Secondary | ICD-10-CM | POA: Diagnosis not present

## 2016-08-10 DIAGNOSIS — J449 Chronic obstructive pulmonary disease, unspecified: Secondary | ICD-10-CM | POA: Insufficient documentation

## 2016-08-10 DIAGNOSIS — Z79899 Other long term (current) drug therapy: Secondary | ICD-10-CM | POA: Diagnosis not present

## 2016-08-10 DIAGNOSIS — K219 Gastro-esophageal reflux disease without esophagitis: Secondary | ICD-10-CM | POA: Insufficient documentation

## 2016-08-10 DIAGNOSIS — K635 Polyp of colon: Secondary | ICD-10-CM | POA: Diagnosis not present

## 2016-08-10 DIAGNOSIS — K2950 Unspecified chronic gastritis without bleeding: Secondary | ICD-10-CM | POA: Insufficient documentation

## 2016-08-10 DIAGNOSIS — Z96641 Presence of right artificial hip joint: Secondary | ICD-10-CM | POA: Diagnosis not present

## 2016-08-10 DIAGNOSIS — D125 Benign neoplasm of sigmoid colon: Secondary | ICD-10-CM | POA: Insufficient documentation

## 2016-08-10 DIAGNOSIS — R1310 Dysphagia, unspecified: Secondary | ICD-10-CM | POA: Diagnosis not present

## 2016-08-10 DIAGNOSIS — I10 Essential (primary) hypertension: Secondary | ICD-10-CM | POA: Diagnosis not present

## 2016-08-10 DIAGNOSIS — D124 Benign neoplasm of descending colon: Secondary | ICD-10-CM | POA: Insufficient documentation

## 2016-08-10 DIAGNOSIS — F17210 Nicotine dependence, cigarettes, uncomplicated: Secondary | ICD-10-CM | POA: Diagnosis not present

## 2016-08-10 DIAGNOSIS — D123 Benign neoplasm of transverse colon: Secondary | ICD-10-CM | POA: Insufficient documentation

## 2016-08-10 DIAGNOSIS — Z7982 Long term (current) use of aspirin: Secondary | ICD-10-CM | POA: Diagnosis not present

## 2016-08-10 DIAGNOSIS — Z1211 Encounter for screening for malignant neoplasm of colon: Secondary | ICD-10-CM | POA: Insufficient documentation

## 2016-08-10 DIAGNOSIS — K2960 Other gastritis without bleeding: Secondary | ICD-10-CM | POA: Diagnosis not present

## 2016-08-10 HISTORY — PX: COLONOSCOPY WITH PROPOFOL: SHX5780

## 2016-08-10 HISTORY — PX: ESOPHAGOGASTRODUODENOSCOPY (EGD) WITH PROPOFOL: SHX5813

## 2016-08-10 SURGERY — COLONOSCOPY WITH PROPOFOL
Anesthesia: General

## 2016-08-10 MED ORDER — PROPOFOL 10 MG/ML IV BOLUS
INTRAVENOUS | Status: AC
  Filled 2016-08-10: qty 20

## 2016-08-10 MED ORDER — LIDOCAINE HCL (CARDIAC) 20 MG/ML IV SOLN
INTRAVENOUS | Status: DC | PRN
  Administered 2016-08-10 (×4): 25 mg via INTRAVENOUS

## 2016-08-10 MED ORDER — PIPERACILLIN-TAZOBACTAM 3.375 G IVPB 30 MIN
3.3750 g | Freq: Once | INTRAVENOUS | Status: AC
  Administered 2016-08-10: 3.375 g via INTRAVENOUS
  Filled 2016-08-10: qty 50

## 2016-08-10 MED ORDER — PROPOFOL 10 MG/ML IV BOLUS
INTRAVENOUS | Status: DC | PRN
  Administered 2016-08-10 (×4): 50 mg via INTRAVENOUS

## 2016-08-10 MED ORDER — PROPOFOL 500 MG/50ML IV EMUL
INTRAVENOUS | Status: DC | PRN
  Administered 2016-08-10: 125 ug/kg/min via INTRAVENOUS

## 2016-08-10 MED ORDER — SODIUM CHLORIDE 0.9 % IV SOLN
INTRAVENOUS | Status: DC
  Administered 2016-08-10: 14:00:00 via INTRAVENOUS

## 2016-08-10 MED ORDER — SODIUM CHLORIDE 0.9 % IV SOLN: INTRAVENOUS | Status: DC

## 2016-08-10 MED ORDER — PHENYLEPHRINE HCL 10 MG/ML IJ SOLN
INTRAMUSCULAR | Status: AC
  Filled 2016-08-10: qty 1

## 2016-08-10 MED ORDER — LIDOCAINE HCL (PF) 2 % IJ SOLN
INTRAMUSCULAR | Status: AC
  Filled 2016-08-10: qty 2

## 2016-08-10 MED ORDER — SODIUM CHLORIDE 0.9 % IV SOLN
INTRAVENOUS | Status: DC | PRN
  Administered 2016-08-10: 14:00:00 via INTRAVENOUS

## 2016-08-10 MED ORDER — PROPOFOL 500 MG/50ML IV EMUL
INTRAVENOUS | Status: AC
  Filled 2016-08-10: qty 50

## 2016-08-10 MED ORDER — PHENYLEPHRINE HCL 10 MG/ML IJ SOLN
INTRAMUSCULAR | Status: DC | PRN
  Administered 2016-08-10 (×2): 100 ug via INTRAVENOUS
  Administered 2016-08-10: 120 ug via INTRAVENOUS
  Administered 2016-08-10: 100 ug via INTRAVENOUS

## 2016-08-10 MED ORDER — PHENYLEPHRINE 40 MCG/ML (10ML) SYRINGE FOR IV PUSH (FOR BLOOD PRESSURE SUPPORT)
PREFILLED_SYRINGE | INTRAVENOUS | Status: AC
  Filled 2016-08-10: qty 10

## 2016-08-10 NOTE — Op Note (Signed)
[facility] Patient Name: Mariah [name] Procedure Date: 08/10/2016 1:35 PM MRN: [id number] Account #: [id number] Date of Birth: 25-Dec-1945 Admit Type: Outpatient Age: 71 Room: [facility] ENDO ROOM 1 Gender: Female Note Status: Finalized Procedure:            Upper GI endoscopy Indications:          Dysphagia, Heartburn Providers:            Manya Silvas, MD Medicines:            Propofol per Anesthesia Complications:        No immediate complications. Procedure:            Pre-Anesthesia Assessment:                       - After reviewing the risks and benefits, the patient                        was deemed in satisfactory condition to undergo the                        procedure.                       After obtaining informed consent, the endoscope was                        passed under direct vision. Throughout the procedure,                        the patient's blood pressure, pulse, and oxygen                        saturations were monitored continuously. The Endoscope                        was introduced through the mouth, and advanced to the                        second part of duodenum. The upper GI endoscopy was                        accomplished without difficulty. The patient tolerated                        the procedure well. Findings:      A mild Schatzki ring (acquired) was found at the gastroesophageal       junction. At the end of the procedure A guidewire was placed and the       scope was withdrawn. Dilation was performed with a Savary dilator with       mild resistance at 16 mm.      Localized and segmental mild inflammation characterized by erythema and       granularity was found on the greater curvature of the stomach and in the       gastric antrum. Biopsies were taken with a cold forceps for histology.       Biopsies were taken with a cold forceps for Helicobacter pylori testing.      The examined duodenum was  normal. Impression:           - Mild Schatzki ring.  Dilated.                       - Gastritis. Biopsied.                       - Normal examined duodenum. Recommendation:       - Await pathology results.                       - Perform a colonoscopy as previously scheduled. Manya Silvas, MD 08/10/2016 2:01:48 PM This report has been signed electronically. Number of Addenda: 0 Note Initiated On: 08/10/2016 1:35 PM      [facility]

## 2016-08-10 NOTE — H&P (Signed)
Primary Care Physician:  Lelon Huh, MD Primary Gastroenterologist:  Dr. Vira Agar  Pre-Procedure History & Physical: HPI:  Mariah [name] is a 71 y.o. female is here for an endoscopy and colonoscopy.   Past Medical History:  Diagnosis Date  . Arthritis   . Hypertension     Past Surgical History:  Procedure Laterality Date  . APPENDECTOMY  2000  . FOOT SURGERY Bilateral   . JOINT REPLACEMENT    . 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 TOTAL HIP ARTHROPLASTY Right 01/27/2016   Procedure: TOTAL HIP ARTHROPLASTY;  Surgeon: Dereck Leep, MD;  Location: [facility] ORS;  Service: Orthopedics;  Laterality: Right;    Prior to Admission medications   Medication Sig Start Date End Date Taking? Authorizing Provider  Apremilast (OTEZLA) 30 MG TABS Take 1 tablet by mouth 2 (two) times daily.   Yes Historical Provider, MD  aspirin 81 MG EC tablet Take 81 mg by mouth 2 (two) times daily. Swallow whole.   Yes Historical Provider, MD  betamethasone dipropionate (DIPROLENE) 0.05 % ointment Apply topically 2 (two) times daily. 06/24/15  Yes Margarita Rana, MD  Cholecalciferol (VITAMIN D3) 2000 units TABS Take 2,000 Units by mouth daily.   Yes Historical Provider, MD  gabapentin (NEURONTIN) 300 MG capsule TAKE 1 CAPSULE (300 MG TOTAL) BY MOUTH 2 (TWO) TIMES DAILY. 05/18/16  Yes Birdie Sons, MD  hydrochlorothiazide (HYDRODIURIL) 12.5 MG tablet TAKE 1 TABLET EVERY DAY 09/30/15  Yes Vickki Muff Chrismon, PA  metoprolol succinate (TOPROL-XL) 50 MG 24 hr tablet TAKE 1 TABLET EVERY DAY 09/30/15  Yes Dennis E Chrismon, PA  pantoprazole (PROTONIX) 40 MG tablet Take 1 tablet (40 mg total) by mouth daily. 04/28/16  Yes Birdie Sons, MD  simvastatin (ZOCOR) 10 MG tablet TAKE 1 TABLET AT BEDTIME 09/30/15  Yes Dennis E Chrismon, PA  traMADol (ULTRAM) 50 MG tablet TAKE 1 TABLET BY MOUTH 4 TIMES A DAY AS NEEDED 07/06/16  Yes Birdie Sons, MD  diphenhydrAMINE (SOMINEX) 25 MG tablet Take 25 mg by mouth as needed for  itching.     Historical Provider, MD  polyethylene glycol powder (GLYCOLAX/MIRALAX) powder Take 17 g by mouth 2 (two) times daily as needed. Patient not taking: Reported on 08/10/2016 08/05/15   Margarita Rana, MD    Allergies as of 07/23/2016  . (No Known Allergies)    Family History  Problem Relation Age of Onset  . Cancer Mother   . Arthritis Father   . Heart disease Father   . COPD Sister   . Fibromyalgia Sister   . Osteoarthritis Sister     Social History   Social History  . Marital status: Single    Spouse name: N/A  . Number of children: N/A  . Years of education: N/A   Occupational History  . Not on file.   Social History Main Topics  . Smoking status: Current Every Day Smoker    Packs/day: 1.00    Years: 0.00  . Smokeless tobacco: Never Used  . Alcohol use Yes     Comment: occasionally glass of wine 1 per month.  . Drug use: No  . Sexual activity: Not on file   Other Topics Concern  . Not on file   Social History Narrative  . No narrative on file    Review of Systems: See HPI, otherwise negative ROS  Physical Exam: BP 135/68   Pulse 92   Temp 97.7 F (36.5 C) (Tympanic)  Resp 20   Ht 5\' 2"  (1.575 m)   Wt 73.9 kg (163 lb)   SpO2 100%   BMI 29.81 kg/m  General:   Alert,  pleasant and cooperative in NAD Head:  Normocephalic and atraumatic. Neck:  Supple; no masses or thyromegaly. Lungs:  Clear throughout to auscultation.    Heart:  Regular rate and rhythm. Abdomen:  Soft, nontender and nondistended. Normal bowel sounds, without guarding, and without rebound.   Neurologic:  Alert and  oriented x4;  grossly normal neurologically.  Impression/Plan: Mariah [name] is here for an endoscopy and colonoscopy to be performed for dysphagia, heartburn, chronic diarrhea, FH colon polyps.  Therefore mark as a screening colonoscopy since her diarrhea has stopped.  Risks, benefits, limitations, and alternatives regarding  endoscopy and colonoscopy have been  reviewed with the patient.  Questions have been answered.  All parties agreeable.   Gaylyn Cheers, MD  08/10/2016, 1:42 PM

## 2016-08-10 NOTE — Anesthesia Postprocedure Evaluation (Signed)
Anesthesia Post Note  Patient: Mariah [name]  Procedure(s) Performed: Procedure(s) (LRB): COLONOSCOPY WITH PROPOFOL (N/A) ESOPHAGOGASTRODUODENOSCOPY (EGD) WITH PROPOFOL (N/A)  Patient location during evaluation: Endoscopy Anesthesia Type: General Level of consciousness: awake and alert and oriented Pain management: pain level controlled Vital Signs Assessment: post-procedure vital signs reviewed and stable Respiratory status: spontaneous breathing, nonlabored ventilation and respiratory function stable Cardiovascular status: blood pressure returned to baseline and stable Postop Assessment: no signs of nausea or vomiting Anesthetic complications: no     Last Vitals:  Vitals:   08/10/16 1313 08/10/16 1448  BP: 135/68 96/65  Pulse: 92   Resp: 20   Temp: 36.5 C (!) 35.7 C    Last Pain:  Vitals:   08/10/16 1448  TempSrc: Tympanic  PainSc:                  Jalil Lorusso

## 2016-08-10 NOTE — Op Note (Signed)
[facility] Gastroenterology Patient Name: Mariah [name] Procedure Date: 08/10/2016 1:33 PM MRN: [id number] Account #: [id number] Date of Birth: 16-Feb-1946 Admit Type: Outpatient Age: 71 Room: [facility] ENDO ROOM 1 Gender: Female Note Status: Finalized Procedure:            Colonoscopy Indications:          Screening for colorectal malignant neoplasm Providers:            Manya Silvas, MD Medicines:            Propofol per Anesthesia Complications:        No immediate complications. Procedure:            Pre-Anesthesia Assessment:                       - After reviewing the risks and benefits, the patient                        was deemed in satisfactory condition to undergo the                        procedure.                       After obtaining informed consent, the colonoscope was                        passed under direct vision. Throughout the procedure,                        the patient's blood pressure, pulse, and oxygen                        saturations were monitored continuously. The                        Colonoscope was introduced through the anus and                        advanced to the the cecum, identified by appendiceal                        orifice and ileocecal valve. The colonoscopy was                        somewhat difficult due to a tortuous colon. Successful                        completion of the procedure was aided by applying                        abdominal pressure. The patient tolerated the procedure                        well. The quality of the bowel preparation was good. Findings:      A small polyp was found in the transverse colon. The polyp was sessile.       The polyp was removed with a hot snare. Resection and retrieval were       complete.      A small polyp was found in the distal descending colon. The  polyp was       sessile. The polyp was removed with a hot snare. Resection and retrieval       were complete.  A 10 mm polyp was found in the sigmoid colon. The polyp was sessile. The       polyp was removed with a hot snare. Resection and retrieval were       complete. To prevent bleeding after the polypectomy, two hemostatic       clips were successfully placed.      A diminutive polyp was found in the sigmoid colon. The polyp was       sessile. The polyp was removed with a hot snare. Resection and retrieval       were complete. Impression:           - One small polyp in the transverse colon, removed with                        a hot snare. Resected and retrieved.                       - One small polyp in the distal descending colon,                        removed with a hot snare. Resected and retrieved.                       - One 10 mm polyp in the sigmoid colon, removed with a                        hot snare. Resected and retrieved. Clips were placed.                       - One diminutive polyp in the sigmoid colon, removed                        with a hot snare. Resected and retrieved. Recommendation:       - Await pathology results. Manya Silvas, MD 08/10/2016 2:46:16 PM This report has been signed electronically. Number of Addenda: 0 Note Initiated On: 08/10/2016 1:33 PM Scope Withdrawal Time: 0 hours 24 minutes 50 seconds  Total Procedure Duration: 0 hours 34 minutes 43 seconds       [facility]

## 2016-08-10 NOTE — Transfer of Care (Signed)
Immediate Anesthesia Transfer of Care Note  Patient: Mariah [name]  Procedure(s) Performed: Procedure(s): COLONOSCOPY WITH PROPOFOL (N/A) ESOPHAGOGASTRODUODENOSCOPY (EGD) WITH PROPOFOL (N/A)  Patient Location: PACU  Anesthesia Type:General  Level of Consciousness: awake  Airway & Oxygen Therapy: Patient Spontanous Breathing and Patient connected to nasal cannula oxygen  Post-op Assessment: Report given to RN and Post -op Vital signs reviewed and stable  Post vital signs: Reviewed and stable  Last Vitals:  Vitals:   08/10/16 1313  BP: 135/68  Pulse: 92  Resp: 20  Temp: 36.5 C    Last Pain:  Vitals:   08/10/16 1313  TempSrc: Tympanic  PainSc: 8          Complications: No apparent anesthesia complications

## 2016-08-10 NOTE — Anesthesia Preprocedure Evaluation (Signed)
Anesthesia Evaluation  Patient identified by MRN, date of birth, ID band Patient awake    Reviewed: Allergy & Precautions, NPO status , Patient's Chart, lab work & pertinent test results  History of Anesthesia Complications Negative for: history of anesthetic complications  Airway Mallampati: III       Dental  (+) Teeth Intact   Pulmonary COPD, Current Smoker,    + rhonchi  + decreased breath sounds      Cardiovascular Exercise Tolerance: Good hypertension, Pt. on home beta blockers + Peripheral Vascular Disease   Rhythm:Regular     Neuro/Psych    GI/Hepatic Neg liver ROS, GERD  Medicated,  Endo/Other  negative endocrine ROS  Renal/GU negative Renal ROS     Musculoskeletal   Abdominal Normal abdominal exam  (+)   Peds  Hematology negative hematology ROS (+)   Anesthesia Other Findings Past Medical History: No date: Arthritis No date: Hypertension   Reproductive/Obstetrics                             Anesthesia Physical  Anesthesia Plan  ASA: III  Anesthesia Plan: General   Post-op Pain Management:    Induction: Intravenous  Airway Management Planned: Natural Airway and Nasal Cannula  Additional Equipment:   Intra-op Plan:   Post-operative Plan:   Informed Consent: I have reviewed the patients History and Physical, chart, labs and discussed the procedure including the risks, benefits and alternatives for the proposed anesthesia with the patient or authorized representative who has indicated his/her understanding and acceptance.     Plan Discussed with: CRNA  Anesthesia Plan Comments:         Anesthesia Quick Evaluation

## 2016-08-10 NOTE — Anesthesia Post-op Follow-up Note (Cosign Needed)
Anesthesia QCDR form completed.        

## 2016-08-11 ENCOUNTER — Encounter: Payer: Self-pay | Admitting: Unknown Physician Specialty

## 2016-08-12 LAB — SURGICAL PATHOLOGY

## 2016-08-18 ENCOUNTER — Other Ambulatory Visit: Payer: Self-pay | Admitting: Family Medicine

## 2016-08-18 DIAGNOSIS — Z1231 Encounter for screening mammogram for malignant neoplasm of breast: Secondary | ICD-10-CM

## 2016-09-04 ENCOUNTER — Other Ambulatory Visit: Payer: Self-pay | Admitting: Family Medicine

## 2016-09-04 DIAGNOSIS — M1990 Unspecified osteoarthritis, unspecified site: Secondary | ICD-10-CM

## 2016-09-04 NOTE — Telephone Encounter (Signed)
rx called in-aa 

## 2016-09-04 NOTE — Telephone Encounter (Signed)
Please call in tramadol.  

## 2016-09-07 DIAGNOSIS — R12 Heartburn: Secondary | ICD-10-CM | POA: Diagnosis not present

## 2016-09-07 DIAGNOSIS — Z86010 Personal history of colonic polyps: Secondary | ICD-10-CM | POA: Diagnosis not present

## 2016-09-14 ENCOUNTER — Ambulatory Visit
Admission: RE | Admit: 2016-09-14 | Discharge: 2016-09-14 | Disposition: A | Discharge status: Home / Self Care | Payer: PPO | Source: Ambulatory Visit | Attending: Family Medicine | Admitting: Family Medicine

## 2016-09-14 DIAGNOSIS — R928 Other abnormal and inconclusive findings on diagnostic imaging of breast: Secondary | ICD-10-CM | POA: Insufficient documentation

## 2016-09-14 DIAGNOSIS — Z1231 Encounter for screening mammogram for malignant neoplasm of breast: Secondary | ICD-10-CM | POA: Diagnosis not present

## 2016-09-16 ENCOUNTER — Other Ambulatory Visit: Payer: Self-pay | Admitting: Family Medicine

## 2016-09-16 DIAGNOSIS — R928 Other abnormal and inconclusive findings on diagnostic imaging of breast: Secondary | ICD-10-CM

## 2016-09-17 HISTORY — PX: BREAST BIOPSY: SHX20

## 2016-09-29 ENCOUNTER — Other Ambulatory Visit: Payer: Self-pay | Admitting: Family Medicine

## 2016-09-29 ENCOUNTER — Ambulatory Visit
Admission: RE | Admit: 2016-09-29 | Discharge: 2016-09-29 | Disposition: A | Discharge status: Home / Self Care | Payer: PPO | Source: Ambulatory Visit | Attending: Family Medicine | Admitting: Family Medicine

## 2016-09-29 DIAGNOSIS — N6312 Unspecified lump in the right breast, upper inner quadrant: Secondary | ICD-10-CM | POA: Insufficient documentation

## 2016-09-29 DIAGNOSIS — N6310 Unspecified lump in the right breast, unspecified quadrant: Secondary | ICD-10-CM

## 2016-09-29 DIAGNOSIS — R928 Other abnormal and inconclusive findings on diagnostic imaging of breast: Secondary | ICD-10-CM

## 2016-09-29 DIAGNOSIS — N6489 Other specified disorders of breast: Secondary | ICD-10-CM | POA: Diagnosis not present

## 2016-10-05 DIAGNOSIS — M810 Age-related osteoporosis without current pathological fracture: Secondary | ICD-10-CM | POA: Diagnosis not present

## 2016-10-05 DIAGNOSIS — Z79899 Other long term (current) drug therapy: Secondary | ICD-10-CM | POA: Diagnosis not present

## 2016-10-05 DIAGNOSIS — L4050 Arthropathic psoriasis, unspecified: Secondary | ICD-10-CM | POA: Diagnosis not present

## 2016-10-05 DIAGNOSIS — L409 Psoriasis, unspecified: Secondary | ICD-10-CM | POA: Diagnosis not present

## 2016-10-06 ENCOUNTER — Other Ambulatory Visit: Payer: Self-pay | Admitting: Family Medicine

## 2016-10-13 ENCOUNTER — Ambulatory Visit
Admission: RE | Admit: 2016-10-13 | Discharge: 2016-10-13 | Disposition: A | Discharge status: Home / Self Care | Payer: PPO | Source: Ambulatory Visit | Attending: Family Medicine | Admitting: Family Medicine

## 2016-10-13 DIAGNOSIS — N6310 Unspecified lump in the right breast, unspecified quadrant: Secondary | ICD-10-CM

## 2016-10-13 DIAGNOSIS — N6311 Unspecified lump in the right breast, upper outer quadrant: Secondary | ICD-10-CM | POA: Insufficient documentation

## 2016-10-13 DIAGNOSIS — R928 Other abnormal and inconclusive findings on diagnostic imaging of breast: Secondary | ICD-10-CM | POA: Diagnosis not present

## 2016-10-13 DIAGNOSIS — N6312 Unspecified lump in the right breast, upper inner quadrant: Secondary | ICD-10-CM | POA: Diagnosis not present

## 2016-10-13 DIAGNOSIS — N6031 Fibrosclerosis of right breast: Secondary | ICD-10-CM | POA: Diagnosis not present

## 2016-10-14 LAB — SURGICAL PATHOLOGY

## 2016-10-21 ENCOUNTER — Telehealth: Payer: Self-pay

## 2016-10-21 NOTE — Telephone Encounter (Signed)
Called pt to schedule AWV, left message ANR

## 2016-10-22 ENCOUNTER — Telehealth: Payer: Self-pay

## 2016-10-22 NOTE — Telephone Encounter (Signed)
FYI: Orlean Bradford from [facility] called stating that she has tried reaching patient several times to get her scheduled for a surgical referral due to a radial scar. Patient has not called her back and it has been over 1 week. Neider states she will continue to try calling the patient, but she wanted to let you know so that the patient doesn't fall through the cracks. Her call back is [phone].

## 2016-11-09 DIAGNOSIS — N6310 Unspecified lump in the right breast, unspecified quadrant: Secondary | ICD-10-CM | POA: Diagnosis not present

## 2016-11-10 ENCOUNTER — Other Ambulatory Visit: Payer: Self-pay | Admitting: Surgery

## 2016-11-10 DIAGNOSIS — N63 Unspecified lump in unspecified breast: Secondary | ICD-10-CM

## 2016-11-16 ENCOUNTER — Inpatient Hospital Stay: Admission: RE | Admit: 2016-11-16 | Payer: PPO | Source: Ambulatory Visit

## 2016-11-16 ENCOUNTER — Encounter
Admission: RE | Admit: 2016-11-16 | Discharge: 2016-11-16 | Disposition: A | Discharge status: Home / Self Care | Payer: PPO | Source: Ambulatory Visit | Attending: Surgery | Admitting: Surgery

## 2016-11-16 DIAGNOSIS — Z01812 Encounter for preprocedural laboratory examination: Secondary | ICD-10-CM | POA: Diagnosis not present

## 2016-11-16 DIAGNOSIS — Z87891 Personal history of nicotine dependence: Secondary | ICD-10-CM | POA: Diagnosis not present

## 2016-11-16 DIAGNOSIS — Z01810 Encounter for preprocedural cardiovascular examination: Secondary | ICD-10-CM | POA: Insufficient documentation

## 2016-11-16 DIAGNOSIS — J449 Chronic obstructive pulmonary disease, unspecified: Secondary | ICD-10-CM | POA: Diagnosis not present

## 2016-11-16 DIAGNOSIS — F17200 Nicotine dependence, unspecified, uncomplicated: Secondary | ICD-10-CM | POA: Diagnosis not present

## 2016-11-16 DIAGNOSIS — I10 Essential (primary) hypertension: Secondary | ICD-10-CM | POA: Insufficient documentation

## 2016-11-16 DIAGNOSIS — R9431 Abnormal electrocardiogram [ECG] [EKG]: Secondary | ICD-10-CM | POA: Insufficient documentation

## 2016-11-16 HISTORY — DX: Headache, unspecified: R51.9

## 2016-11-16 HISTORY — DX: Gastro-esophageal reflux disease without esophagitis: K21.9

## 2016-11-16 HISTORY — DX: Abdominal aortic aneurysm, without rupture: I71.4

## 2016-11-16 HISTORY — DX: Radiculopathy, lumbar region: M54.16

## 2016-11-16 HISTORY — DX: Vitamin D deficiency, unspecified: E55.9

## 2016-11-16 HISTORY — DX: Personal history of adenomatous and serrated colon polyps: Z86.0101

## 2016-11-16 HISTORY — DX: Headache: R51

## 2016-11-16 HISTORY — DX: Abdominal aortic aneurysm, without rupture, unspecified: I71.40

## 2016-11-16 HISTORY — DX: Personal history of colonic polyps: Z86.010

## 2016-11-16 HISTORY — DX: Psoriasis, unspecified: L40.9

## 2016-11-16 HISTORY — DX: Hyperlipidemia, unspecified: E78.5

## 2016-11-16 HISTORY — DX: Chronic obstructive pulmonary disease, unspecified: J44.9

## 2016-11-16 LAB — BASIC METABOLIC PANEL
Anion gap: 8 (ref 5–15)
BUN: 15 mg/dL (ref 6–20)
CALCIUM: 9 mg/dL (ref 8.9–10.3)
CO2: 30 mmol/L (ref 22–32)
CREATININE: 0.94 mg/dL (ref 0.44–1.00)
Chloride: 100 mmol/L — ABNORMAL LOW (ref 101–111)
GFR calc non Af Amer: 59 mL/min — ABNORMAL LOW (ref >60)
Glucose, Bld: 90 mg/dL (ref 65–99)
Potassium: 4.1 mmol/L (ref 3.5–5.1)
SODIUM: 138 mmol/L (ref 135–145)

## 2016-11-16 LAB — CBC
HCT: 41.3 % (ref 35.0–47.0)
Hemoglobin: 13.5 g/dL (ref 12.0–16.0)
MCH: 26.9 pg (ref 26.0–34.0)
MCHC: 32.7 g/dL (ref 32.0–36.0)
MCV: 82.3 fL (ref 80.0–100.0)
Platelets: 171 10*3/uL (ref 150–440)
RBC: 5.01 MIL/uL (ref 3.80–5.20)
RDW: 14.7 % — ABNORMAL HIGH (ref 11.5–14.5)
WBC: 5.6 10*3/uL (ref 3.6–11.0)

## 2016-11-16 NOTE — Pre-Procedure Instructions (Signed)
Comparing today's EKG to one done 01/15/2016, no change except minimal criteria for Inferior infarct are no longer present.

## 2016-11-16 NOTE — Patient Instructions (Signed)
  Your procedure is scheduled UX:NATFTDD Nov 24, 2016. Report to [facility] at 11:00 AM.  Remember: Instructions that are not followed completely may result in serious medical risk, up to and including death, or upon the discretion of your surgeon and anesthesiologist your surgery may need to be rescheduled.    _x___ 1. Do not eat food or drink liquids after midnight. No gum chewing or hard candies.     _X___ 2. No Alcohol for 24 hours before or after surgery.   ____ 3. Bring all medications with you on the day of surgery if instructed.    __x__ 4. Notify your doctor if there is any change in your medical condition     (cold, fever, infections).    __X___ 5. No smoking 24 hours prior to surgery.     Do not wear jewelry, make-up, hairpins, clips or nail polish.  Do not wear lotions, powders, or perfumes.   Do not shave 48 hours prior to surgery. Men may shave face and neck.  Do not bring valuables to the hospital.    [facility] is not responsible for any belongings or valuables.               Contacts, dentures or bridgework may not be worn into surgery.  Leave your suitcase in the car. After surgery it may be brought to your room.  For patients admitted to the hospital, discharge time is determined by your treatment team.   Patients discharged the day of surgery will not be allowed to drive home.    Please read over the following fact sheets that you were given:   [facility] Preparing for Surgery  __X__ Take these medicines the morning of surgery with A SIP OF WATER:    1. gabapentin (NEURONTIN)  2. metoprolol succinate (TOPROL-XL)  3. pantoprazole (PROTONIX)   ____ Fleet Enema (as directed)   __X__ Use CHG Soap as directed on instruction sheet  ____ Use inhalers on the day of surgery and bring to hospital day of surgery  ____ Stop metformin 2 days prior to surgery    ____ Take 1/2 of usual insulin dose the night before surgery and none on the morning of  surgery.   __x__ Stop aspirin as instructed by Dr. Tamala Julian.  _x___ Stop Anti-inflammatories such as Advil, Aleve, Ibuprofen, Motrin, Naproxen, Naprosyn, Goodies powders or aspirin products. OK to take Tylenol.   ____ Stop supplements until after surgery.    ____ Bring C-Pap to the hospital.

## 2016-11-24 ENCOUNTER — Ambulatory Visit: Payer: PPO | Admitting: Certified Registered Nurse Anesthetist

## 2016-11-24 ENCOUNTER — Ambulatory Visit
Admission: RE | Admit: 2016-11-24 | Discharge: 2016-11-24 | Disposition: A | Discharge status: Home / Self Care | Payer: PPO | Source: Ambulatory Visit | Attending: Surgery | Admitting: Surgery

## 2016-11-24 ENCOUNTER — Encounter
Admission: RE | Disposition: A | Discharge status: Home / Self Care | Payer: Self-pay | Source: Ambulatory Visit | Attending: Surgery

## 2016-11-24 ENCOUNTER — Encounter: Payer: Self-pay | Admitting: *Deleted

## 2016-11-24 DIAGNOSIS — N6311 Unspecified lump in the right breast, upper outer quadrant: Secondary | ICD-10-CM | POA: Insufficient documentation

## 2016-11-24 DIAGNOSIS — K219 Gastro-esophageal reflux disease without esophagitis: Secondary | ICD-10-CM | POA: Insufficient documentation

## 2016-11-24 DIAGNOSIS — J449 Chronic obstructive pulmonary disease, unspecified: Secondary | ICD-10-CM | POA: Diagnosis not present

## 2016-11-24 DIAGNOSIS — Z79899 Other long term (current) drug therapy: Secondary | ICD-10-CM | POA: Insufficient documentation

## 2016-11-24 DIAGNOSIS — N63 Unspecified lump in unspecified breast: Secondary | ICD-10-CM

## 2016-11-24 DIAGNOSIS — E559 Vitamin D deficiency, unspecified: Secondary | ICD-10-CM | POA: Diagnosis not present

## 2016-11-24 DIAGNOSIS — F17200 Nicotine dependence, unspecified, uncomplicated: Secondary | ICD-10-CM | POA: Diagnosis not present

## 2016-11-24 DIAGNOSIS — N6310 Unspecified lump in the right breast, unspecified quadrant: Secondary | ICD-10-CM | POA: Diagnosis not present

## 2016-11-24 DIAGNOSIS — E785 Hyperlipidemia, unspecified: Secondary | ICD-10-CM | POA: Diagnosis not present

## 2016-11-24 DIAGNOSIS — N6489 Other specified disorders of breast: Secondary | ICD-10-CM | POA: Diagnosis not present

## 2016-11-24 DIAGNOSIS — L409 Psoriasis, unspecified: Secondary | ICD-10-CM | POA: Diagnosis not present

## 2016-11-24 DIAGNOSIS — R928 Other abnormal and inconclusive findings on diagnostic imaging of breast: Secondary | ICD-10-CM | POA: Diagnosis not present

## 2016-11-24 DIAGNOSIS — I10 Essential (primary) hypertension: Secondary | ICD-10-CM | POA: Insufficient documentation

## 2016-11-24 DIAGNOSIS — Z7982 Long term (current) use of aspirin: Secondary | ICD-10-CM | POA: Diagnosis not present

## 2016-11-24 HISTORY — PX: BREAST LUMPECTOMY: SHX2

## 2016-11-24 HISTORY — PX: BREAST LUMPECTOMY WITH NEEDLE LOCALIZATION: SHX5759

## 2016-11-24 SURGERY — BREAST LUMPECTOMY WITH NEEDLE LOCALIZATION
Anesthesia: General | Site: Breast | Laterality: Right | Wound class: Clean

## 2016-11-24 MED ORDER — HYDROCODONE-ACETAMINOPHEN 5-325 MG PO TABS: 1.0000 | ORAL_TABLET | ORAL | 0 refills | Status: DC | PRN

## 2016-11-24 MED ORDER — OXYCODONE HCL 5 MG/5ML PO SOLN: 5.0000 mg | Freq: Once | ORAL | Status: AC | PRN

## 2016-11-24 MED ORDER — LACTATED RINGERS IV SOLN
INTRAVENOUS | Status: DC
  Administered 2016-11-24: 12:00:00 via INTRAVENOUS

## 2016-11-24 MED ORDER — OXYCODONE HCL 5 MG PO TABS
ORAL_TABLET | ORAL | Status: AC
  Filled 2016-11-24: qty 1

## 2016-11-24 MED ORDER — PROPOFOL 10 MG/ML IV BOLUS
INTRAVENOUS | Status: DC | PRN
  Administered 2016-11-24: 130 mg via INTRAVENOUS

## 2016-11-24 MED ORDER — HYDROCODONE-ACETAMINOPHEN 5-325 MG PO TABS
1.0000 | ORAL_TABLET | ORAL | Status: DC | PRN
Start: 2016-11-24 — End: 2016-11-24

## 2016-11-24 MED ORDER — FENTANYL CITRATE (PF) 100 MCG/2ML IJ SOLN
25.0000 ug | INTRAMUSCULAR | Status: DC | PRN
Start: 2016-11-24 — End: 2016-11-24

## 2016-11-24 MED ORDER — BUPIVACAINE-EPINEPHRINE (PF) 0.5% -1:200000 IJ SOLN
INTRAMUSCULAR | Status: AC
  Filled 2016-11-24: qty 30

## 2016-11-24 MED ORDER — FENTANYL CITRATE (PF) 100 MCG/2ML IJ SOLN
INTRAMUSCULAR | Status: DC | PRN
  Administered 2016-11-24 (×2): 25 ug via INTRAVENOUS
  Administered 2016-11-24: 50 ug via INTRAVENOUS

## 2016-11-24 MED ORDER — DEXAMETHASONE SODIUM PHOSPHATE 10 MG/ML IJ SOLN
INTRAMUSCULAR | Status: DC | PRN
  Administered 2016-11-24: 8 mg via INTRAVENOUS

## 2016-11-24 MED ORDER — ONDANSETRON HCL 4 MG/2ML IJ SOLN
INTRAMUSCULAR | Status: AC
  Filled 2016-11-24: qty 2

## 2016-11-24 MED ORDER — PROPOFOL 10 MG/ML IV BOLUS
INTRAVENOUS | Status: AC
  Filled 2016-11-24: qty 20

## 2016-11-24 MED ORDER — OXYCODONE HCL 5 MG PO TABS
5.0000 mg | ORAL_TABLET | Freq: Once | ORAL | Status: AC | PRN
  Administered 2016-11-24: 5 mg via ORAL

## 2016-11-24 MED ORDER — FENTANYL CITRATE (PF) 100 MCG/2ML IJ SOLN
INTRAMUSCULAR | Status: AC
  Filled 2016-11-24: qty 2

## 2016-11-24 MED ORDER — BUPIVACAINE-EPINEPHRINE 0.5% -1:200000 IJ SOLN
INTRAMUSCULAR | Status: DC | PRN
  Administered 2016-11-24: 8 mL

## 2016-11-24 MED ORDER — EPHEDRINE SULFATE 50 MG/ML IJ SOLN
INTRAMUSCULAR | Status: DC | PRN
  Administered 2016-11-24: 10 mg via INTRAVENOUS

## 2016-11-24 MED ORDER — DEXAMETHASONE SODIUM PHOSPHATE 10 MG/ML IJ SOLN
INTRAMUSCULAR | Status: AC
  Filled 2016-11-24: qty 1

## 2016-11-24 MED ORDER — ONDANSETRON HCL 4 MG/2ML IJ SOLN
INTRAMUSCULAR | Status: DC | PRN
  Administered 2016-11-24: 4 mg via INTRAVENOUS

## 2016-11-24 SURGICAL SUPPLY — 27 items

## 2016-11-24 NOTE — Progress Notes (Signed)
Dr. Smith into see 

## 2016-11-24 NOTE — Anesthesia Post-op Follow-up Note (Cosign Needed)
Anesthesia QCDR form completed.        

## 2016-11-24 NOTE — Anesthesia Procedure Notes (Signed)
Procedure Name: LMA Insertion Date/Time: 11/24/2016 2:29 PM Performed by: Darlyne Russian Pre-anesthesia Checklist: Patient identified, Emergency Drugs available, Suction available, Patient being monitored and Timeout performed Patient Re-evaluated:Patient Re-evaluated prior to inductionOxygen Delivery Method: Circle system utilized Preoxygenation: Pre-oxygenation with 100% oxygen Intubation Type: IV induction Ventilation: Mask ventilation without difficulty LMA: LMA inserted LMA Size: 4.0 Number of attempts: 1 Placement Confirmation: positive ETCO2 Tube secured with: Tape Dental Injury: Teeth and Oropharynx as per pre-operative assessment

## 2016-11-24 NOTE — Anesthesia Postprocedure Evaluation (Signed)
Anesthesia Post Note  Patient: Mariah [name]  Procedure(s) Performed: Procedure(s) (LRB): BREAST LUMPECTOMY WITH NEEDLE LOCALIZATION (Right)  Patient location during evaluation: PACU Anesthesia Type: General Level of consciousness: awake and alert Pain management: pain level controlled Vital Signs Assessment: post-procedure vital signs reviewed and stable Respiratory status: spontaneous breathing, nonlabored ventilation, respiratory function stable and patient connected to nasal cannula oxygen Cardiovascular status: stable and blood pressure returned to baseline Postop Assessment: no signs of nausea or vomiting Anesthetic complications: no     Last Vitals:  Vitals:   11/24/16 1629 11/24/16 1700  BP: 111/70 (!) 108/49  Pulse: 76 75  Resp: 16   Temp: (!) 35.8 C     Last Pain:  Vitals:   11/24/16 1629  TempSrc: Temporal  PainSc: [place]

## 2016-11-24 NOTE — H&P (Signed)
  She had recent mammogram depicting a area of distortion in the upper aspect of the right breast. Ultrasound demonstrated an ill-defined irregular 7 mm mass mass at that site. Ultrasound-guided core needle biopsy demonstrated radial scar. The site was 12:00 position 2 cm from the nipple. Excision was recommended for definitive treatment. The patient has had preoperative x-ray needle localization.  She reports no change in overall condition since the office visit.  I discussed the plan for excision of right breast mass

## 2016-11-24 NOTE — Op Note (Signed)
OPERATIVE REPORT  PREOPERATIVE  DIAGNOSIS: . Right breast mass  POSTOPERATIVE DIAGNOSIS: . Right breast mass  PROCEDURE: . Excision right breast mass  ANESTHESIA:  General  SURGEON: Rochel Brome  MD   INDICATIONS: . She had recent needle biopsy findings of radial scar of the upper outer aspect of the right breast. Excision was recommended for definitive treatment. She had preoperative insertion of a Kopan's wire. Mammogram images were reviewed demonstrating the proximity of the Kopan's wire with the biopsy marker.  With the patient on the operating table in the supine position under anesthesia the right arm was placed on a lateral arm support. The dressing was removed from the right breast exposing the Kopan's wire which entered the breast at approximately 11:00 position. The wire was cut 2 cm from the skin. The breast was prepared with ChloraPrep and draped in a sterile manner  A curvilinear incision was made from 10:00 to 12:00 position 4 cm from the nipple and carried down through subcutaneous tissues. The wire was encountered. A mass of tissue surrounding the wire was excised which was approximately 2.5 x 2.5 x4 cm in dimension. This was labeled with margin maps to suture markers to the medial lateral cranial caudal and deep margins. The specimen mammogram demonstrated the location of the biopsy marker within the specimen. The specimen has been submitted for routine pathology.  The wound was inspected and could see hemostasis was intact. Subcutaneous tissues were infiltrated with half percent Sensorcaine with epinephrine. The subcutaneous tissues were approximated with interrupted 4-0 chromic. The skin was closed with running 4-0 Monocryl subcuticular suture and Dermabond  The patient tolerated surgery satisfactorily and was then prepared for transfer to the recovery room  [employer].D.

## 2016-11-24 NOTE — Transfer of Care (Signed)
Immediate Anesthesia Transfer of Care Note  Patient: Mariah [name]  Procedure(s) Performed: Procedure(s): BREAST LUMPECTOMY WITH NEEDLE LOCALIZATION (Right)  Patient Location: PACU  Anesthesia Type:General  Level of Consciousness: drowsy and patient cooperative  Airway & Oxygen Therapy: Patient Spontanous Breathing and Patient connected to face mask oxygen  Post-op Assessment: Report given to RN and Post -op Vital signs reviewed and stable  Post vital signs: Reviewed and stable  Last Vitals:  Vitals:   11/24/16 1144 11/24/16 1545  BP: 127/82 (!) 161/99  Pulse: 81 82  Resp: 18 16  Temp: 36.8 C 36.4 C    Last Pain:  Vitals:   11/24/16 1144  TempSrc: Oral  PainSc: 6          Complications: No apparent anesthesia complications

## 2016-11-24 NOTE — Discharge Instructions (Addendum)
Take Tylenol or Norco if needed for pain.  Should not drive when taking Norco.  May shower.  AMBULATORY SURGERY  DISCHARGE INSTRUCTIONS   1) The drugs that you were given will stay in your system until tomorrow so for the next 24 hours you should not:  A) Drive an automobile B) Make any legal decisions C) Drink any alcoholic beverage   2) You may resume regular meals tomorrow.  Today it is better to start with liquids and gradually work up to solid foods.  You may eat anything you prefer, but it is better to start with liquids, then soup and crackers, and gradually work up to solid foods.   3) Please notify your doctor immediately if you have any unusual bleeding, trouble breathing, redness and pain at the surgery site, drainage, fever, or pain not relieved by medication.    4) Additional Instructions:        Please contact your physician with any problems or Same Day Surgery at [phone], Monday through Friday 6 am to 4 pm, or [place] at [facility] number at [phone].

## 2016-11-24 NOTE — Anesthesia Preprocedure Evaluation (Signed)
Anesthesia Evaluation  Patient identified by MRN, date of birth, ID band Patient awake    Reviewed: Allergy & Precautions, H&P , NPO status , Patient's Chart, lab work & pertinent test results  History of Anesthesia Complications Negative for: history of anesthetic complications  Airway Mallampati: III  TM Distance: <3 FB Neck ROM: limited    Dental  (+) Chipped, Caps   Pulmonary neg shortness of breath, COPD, Current Smoker,    Pulmonary exam normal breath sounds clear to auscultation       Cardiovascular Exercise Tolerance: Good hypertension, (-) angina+ Peripheral Vascular Disease  (-) Past MI and (-) DOE Normal cardiovascular exam Rhythm:regular Rate:Normal     Neuro/Psych  Headaches,  Neuromuscular disease negative psych ROS   GI/Hepatic Neg liver ROS, GERD  Medicated and Controlled,  Endo/Other  negative endocrine ROS  Renal/GU      Musculoskeletal  (+) Arthritis ,   Abdominal   Peds  Hematology negative hematology ROS (+)   Anesthesia Other Findings Past Medical History: No date: Abdominal aortic aneurysm (AAA) (HCC) No date: Arthritis No date: COPD (chronic obstructive pulmonary disease) (* No date: GERD (gastroesophageal reflux disease) No date: Headache     Comment: sinus No date: History of adenomatous polyp of colon No date: Hyperlipidemia No date: Hypertension No date: Psoriasis No date: Right lumbar radiculitis No date: Vitamin D deficiency  Past Surgical History: 2000: APPENDECTOMY 2018: BREAST BIOPSY Right 08/10/2016: COLONOSCOPY WITH PROPOFOL N/A     Comment: Procedure: COLONOSCOPY WITH PROPOFOL;                Surgeon: Manya Silvas, MD;  Location: [facility]               ENDOSCOPY;  Service: Endoscopy;  Laterality:               N/A; 08/10/2016: ESOPHAGOGASTRODUODENOSCOPY (EGD) WITH PROPOFOL N/A     Comment: Procedure: ESOPHAGOGASTRODUODENOSCOPY (EGD)               WITH PROPOFOL;   Surgeon: Manya Silvas, MD;               Location: [facility] ENDOSCOPY;  Service: Endoscopy;               Laterality: N/A; No date: FOOT SURGERY Bilateral 01/27/2016: JOINT REPLACEMENT Right     Comment: 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     Comment: benign 01/27/2016: TOTAL HIP ARTHROPLASTY Right     Comment: Procedure: TOTAL HIP ARTHROPLASTY;  Surgeon:               Dereck Leep, MD;  Location: [facility] ORS;                Service: Orthopedics;  Laterality: Right;  BMI    Body Mass Index:  31.11 kg/m      Reproductive/Obstetrics negative OB ROS                             Anesthesia Physical Anesthesia Plan  ASA: III  Anesthesia Plan: General   Post-op Pain Management:    Induction: Intravenous  Airway Management Planned: LMA  Additional Equipment:   Intra-op Plan:   Post-operative Plan: Extubation in OR  Informed Consent: I have reviewed the patients History and Physical, chart, labs and discussed the procedure including the risks, benefits and alternatives for the proposed anesthesia with the patient or authorized representative who has indicated his/her understanding and  acceptance.   Dental Advisory Given  Plan Discussed with: Anesthesiologist, CRNA and Surgeon  Anesthesia Plan Comments: (Patient consented for risks of anesthesia including but not limited to:  - adverse reactions to medications - damage to teeth, lips or other oral mucosa - sore throat or hoarseness - Damage to heart, brain, lungs or loss of life  Patient voiced understanding.)        Anesthesia Quick Evaluation

## 2016-11-25 ENCOUNTER — Encounter: Payer: Self-pay | Admitting: Surgery

## 2016-11-27 LAB — SURGICAL PATHOLOGY

## 2016-12-10 ENCOUNTER — Telehealth: Payer: Self-pay | Admitting: Family Medicine

## 2016-12-21 ENCOUNTER — Telehealth: Payer: Self-pay | Admitting: Family Medicine

## 2016-12-22 NOTE — Telephone Encounter (Signed)
Pt called back but could only do Monday afternoons for her AWE so she will look at her schedule and call back.

## 2016-12-31 ENCOUNTER — Other Ambulatory Visit: Payer: Self-pay | Admitting: Family Medicine

## 2016-12-31 DIAGNOSIS — M1990 Unspecified osteoarthritis, unspecified site: Secondary | ICD-10-CM

## 2016-12-31 NOTE — Telephone Encounter (Signed)
Please call in tramadol.  

## 2017-01-01 NOTE — Telephone Encounter (Signed)
Rx called in to pharmacy. 

## 2017-01-07 ENCOUNTER — Other Ambulatory Visit: Payer: Self-pay | Admitting: Family Medicine

## 2017-01-07 MED ORDER — PANTOPRAZOLE SODIUM 40 MG PO TBEC: 40.0000 mg | DELAYED_RELEASE_TABLET | Freq: Every day | ORAL | 4 refills | Status: DC

## 2017-01-07 NOTE — Telephone Encounter (Signed)
[facility] pharmacy faxed a request on the following medication. Thanks CC   pantoprazole (PROTONIX) 20 MG tablet  >Take 1 tablet by mouth once daily.

## 2017-01-17 ENCOUNTER — Other Ambulatory Visit: Payer: Self-pay | Admitting: Family Medicine

## 2017-01-18 ENCOUNTER — Telehealth: Payer: Self-pay | Admitting: Family Medicine

## 2017-01-25 ENCOUNTER — Other Ambulatory Visit: Payer: Self-pay | Admitting: Family Medicine

## 2017-02-16 ENCOUNTER — Other Ambulatory Visit: Payer: Self-pay | Admitting: Family Medicine

## 2017-02-16 NOTE — Telephone Encounter (Signed)
Please review. Thanks!  

## 2017-02-18 DIAGNOSIS — Z96641 Presence of right artificial hip joint: Secondary | ICD-10-CM | POA: Diagnosis not present

## 2017-02-24 ENCOUNTER — Ambulatory Visit: Payer: PPO

## 2017-02-25 ENCOUNTER — Ambulatory Visit (INDEPENDENT_AMBULATORY_CARE_PROVIDER_SITE_OTHER): Payer: PPO

## 2017-02-25 VITALS — BP 120/84 | HR 80 | Temp 97.5°F | Ht 61.0 in | Wt 168.8 lb

## 2017-02-25 DIAGNOSIS — Z0000 Encounter for general adult medical examination without abnormal findings: Secondary | ICD-10-CM

## 2017-02-25 NOTE — Progress Notes (Addendum)
Subjective:   Mariah [name] is a 71 y.o. female who presents for Medicare Annual (Subsequent) preventive examination.  Review of Systems:  N/A  Cardiac Risk Factors include: advanced age (>24men, >90 women);dyslipidemia;hypertension;obesity (BMI >30kg/m2);smoking/ tobacco exposure     Objective:     Vitals: BP 120/84 (BP Location: Left Arm)   Pulse 80   Temp (!) 97.5 F (36.4 C) (Oral)   Ht 5\' 1"  (1.549 m)   Wt 168 lb 12.8 oz (76.6 kg)   BMI 31.89 kg/m   Body mass index is 31.89 kg/m.   Tobacco History  Smoking Status  . Current Every Day Smoker  . Packs/day: 1.00  . Years: 0.00  Smokeless Tobacco  . Never Used     Ready to quit: No Counseling given: No   Past Medical History:  Diagnosis Date  . Abdominal aortic aneurysm (AAA) ([place])   . Arthritis   . COPD (chronic obstructive pulmonary disease) ([place])   . GERD (gastroesophageal reflux disease)   . Headache    sinus  . History of adenomatous polyp of colon   . Hyperlipidemia   . Hypertension   . Psoriasis   . Right lumbar radiculitis   . Vitamin D deficiency    Past Surgical History:  Procedure Laterality Date  . APPENDECTOMY  2000  . BREAST BIOPSY Right 09/2016   radial scar  . BREAST LUMPECTOMY Right 11/24/2016   excision for radial scar  . BREAST LUMPECTOMY WITH NEEDLE LOCALIZATION Right 11/24/2016   Procedure: BREAST LUMPECTOMY WITH NEEDLE LOCALIZATION;  Surgeon: Leonie Green, MD;  Location: [facility] ORS;  Service: General;  Laterality: Right;  . COLONOSCOPY WITH PROPOFOL N/A 08/10/2016   Procedure: COLONOSCOPY WITH PROPOFOL;  Surgeon: Manya Silvas, MD;  Location: [facility] ENDOSCOPY;  Service: Endoscopy;  Laterality: N/A;  . ESOPHAGOGASTRODUODENOSCOPY (EGD) WITH PROPOFOL N/A 08/10/2016   Procedure: ESOPHAGOGASTRODUODENOSCOPY (EGD) WITH PROPOFOL;  Surgeon: Manya Silvas, MD;  Location: [facility] ENDOSCOPY;  Service: Endoscopy;  Laterality: N/A;  . FOOT SURGERY Bilateral   . JOINT REPLACEMENT Right  01/27/2016   hip  . 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 TOTAL HIP ARTHROPLASTY Right 01/27/2016   Procedure: TOTAL HIP ARTHROPLASTY;  Surgeon: Dereck Leep, MD;  Location: [facility] ORS;  Service: Orthopedics;  Laterality: Right;   Family History  Problem Relation Age of Onset  . Cancer Mother   . Arthritis Father   . Heart disease Father   . COPD Sister   . Fibromyalgia Sister   . Osteoarthritis Sister   . Breast cancer Neg Hx    History  Sexual Activity  . Sexual activity: Not on file    Outpatient Encounter Prescriptions as of 02/25/2017  Medication Sig  . Apremilast (OTEZLA) 30 MG TABS Take 1 tablet by mouth 2 (two) times daily.  [name] aspirin 81 MG chewable tablet Chew 81 mg by mouth once. TAKES THE CHEWABLE IN THE EVENING AND THE ENTERIC COATED IN THE MORNING   . aspirin 81 MG EC tablet Take 81 mg by mouth daily. TAKES THE ENTERIC COATED IN THE MORNING AND THE CHEWABLE ASPIRIN IN THE EVENING  . betamethasone dipropionate (DIPROLENE) 0.05 % ointment Apply topically 2 (two) times daily. (Patient taking differently: Apply 1 application topically 2 (two) times daily. )  . Cholecalciferol (VITAMIN D3) 2000 units TABS Take 2,000 Units by mouth daily.  . Cholestyramine POWD 1 Package by Does not apply route as needed.  . diphenhydrAMINE (SOMINEX) 25 MG tablet Take  25 mg by mouth as needed for itching.   . gabapentin (NEURONTIN) 300 MG capsule TAKE 1 CAPSULE (300 MG TOTAL) BY MOUTH 2 (TWO) TIMES DAILY.  . hydrochlorothiazide (HYDRODIURIL) 12.5 MG tablet TAKE 1 TABLET BY MOUTH EVERY DAY  . metoprolol succinate (TOPROL-XL) 50 MG 24 hr tablet TAKE 1 TABLET BY MOUTH EVERY DAY  . pantoprazole (PROTONIX) 20 MG tablet Take 20 mg by mouth daily before supper.  . pantoprazole (PROTONIX) 40 MG tablet Take 1 tablet (40 mg total) by mouth daily. (Patient taking differently: Take 40 mg by mouth daily. )  . polyethylene glycol powder (GLYCOLAX/MIRALAX) powder Take 17 g by mouth 2 (two) times daily as needed.    . simvastatin (ZOCOR) 10 MG tablet TAKE 1 TABLET BY MOUTH AT BEDTIME (Patient taking differently: TAKE 1 TABLET BY MOUTH AT BEDTIME (5 mg))  . traMADol (ULTRAM) 50 MG tablet TAKE 1 TABLET BY MOUTH 4 TIMES A DAY  . chlorpheniramine (CHLOR-TRIMETON) 4 MG tablet Take 4 mg by mouth every 4 (four) hours as needed for allergies.  [name] HYDROcodone-acetaminophen (NORCO) 5-325 MG tablet Take 1-2 tablets by mouth every 4 (four) hours as needed for moderate pain. (Patient not taking: Reported on 02/25/2017)  . Ketotifen Fumarate (ALAWAY OP) Apply 1 drop to eye daily as needed (IRRITATION).   No facility-administered encounter medications on file as of 02/25/2017.     Activities of Daily Living In your present state of health, do you have any difficulty performing the following activities: 02/25/2017 11/16/2016  Hearing? Y N  Vision? N N  Difficulty concentrating or making decisions? Y N  Walking or climbing stairs? Y Y  Comment - does not climb stairs easily.  Dressing or bathing? N N  Doing errands, shopping? N N  Preparing Food and eating ? N -  Using the Toilet? N -  In the past six months, have you accidently leaked urine? Y -  Comment occasionally when sneezes -  Do you have problems with loss of bowel control? N -  Managing your Medications? N -  Managing your Finances? N -  Housekeeping or managing your Housekeeping? N -  Some recent data might be hidden    Patient Care Team: Birdie Sons, MD as PCP - General (Family Medicine) Marry Guan, Laurice Record, MD as Consulting Physician (Orthopedic Surgery) Lucky Cowboy Erskine Squibb, MD as Referring Physician (Vascular Surgery)    Assessment:     Exercise Activities and Dietary recommendations Current Exercise Habits: The patient does not participate in regular exercise at present, Exercise limited by: orthopedic condition(s)  Goals    None     Fall Risk Fall Risk  02/25/2017 02/25/2017 06/24/2015  Falls in the past year? No No No   Depression Screen PHQ 2/9  Scores 02/25/2017 02/25/2017 06/24/2015  PHQ - 2 Score 0 0 0  PHQ- 9 Score 4 - -     Cognitive Function        Immunization History  Administered Date(s) Administered  . Influenza, High Dose Seasonal PF 06/24/2015, 03/16/2016  . Pneumococcal Conjugate-13 06/24/2015  . Pneumococcal Polysaccharide-23 03/26/2011  . Tdap 03/26/2011   Screening Tests Health Maintenance  Topic Date Due  . Hepatitis C Screening  February 14, 1946  . INFLUENZA VACCINE  02/17/2017  . MAMMOGRAM  09/14/2018  . TETANUS/TDAP  03/25/2021  . COLONOSCOPY  08/10/2026  . DEXA SCAN  Completed  . PNA vac Low Risk Adult  Completed      Plan:  I have personally reviewed and  addressed the Medicare Annual Wellness questionnaire and have noted the following in the patient's chart:  A. Medical and social history B. Use of alcohol, tobacco or illicit drugs  C. Current medications and supplements D. Functional ability and status E.  Nutritional status F.  Physical activity G. Advance directives H. List of other physicians I.  Hospitalizations, surgeries, and ER visits in previous 12 months J.  H. [place] such as hearing and vision if needed, cognitive and depression L. Referrals and appointments - none  In addition, I have reviewed and discussed with patient certain preventive protocols, quality metrics, and best practice recommendations. A written personalized care plan for preventive services as well as general preventive health recommendations were provided to patient.  See attached scanned questionnaire for additional information.   Signed,  Fabio Neighbors, LPN Nurse Health Advisor   MD Recommendations: None, pt declined Hep C screening today.

## 2017-02-25 NOTE — Patient Instructions (Signed)
Mariah [name] , Thank you for taking time to come for your Medicare Wellness Visit. I appreciate your ongoing commitment to your health goals. Please review the following plan we discussed and let me know if I can assist you in the future.   Screening recommendations/referrals: Colonoscopy: completed 08/10/16 Mammogram: completed 11/24/16, due 11/2017 Bone Density: completed 07/08/15 Recommended yearly ophthalmology/optometry visit for glaucoma screening and checkup Recommended yearly dental visit for hygiene and checkup  Vaccinations: Influenza vaccine: due fall 2018 Pneumococcal vaccine: completed series Tdap vaccine: completed 03/26/11, due 03/2021 Shingles vaccine: declined  Advanced directives: Advance directive discussed with you today. Even though you declined this today please call our office should you change your mind and we can give you the proper paperwork for you to fill out.  Conditions/risks identified: Obesity; Smoking cessation- Recommend to quit smoking. Start by tapering down until completely quit.   Next appointment: None, need to schedule physical with PCP and 1 year AWV.   Preventive Care 75 Years and Older, Female Preventive care refers to lifestyle choices and visits with your health care provider that can promote health and wellness. What does preventive care include?  A yearly physical exam. This is also called an annual well check.  Dental exams once or twice a year.  Routine eye exams. Ask your health care provider how often you should have your eyes checked.  Personal lifestyle choices, including:  Daily care of your teeth and gums.  Regular physical activity.  Eating a healthy diet.  Avoiding tobacco and drug use.  Limiting alcohol use.  Practicing safe sex.  Taking low-dose aspirin every day.  Taking vitamin and mineral supplements as recommended by your health care provider. What happens during an annual well check? The services and  screenings done by your health care provider during your annual well check will depend on your age, overall health, lifestyle risk factors, and family history of disease. Counseling  Your health care provider may ask you questions about your:  Alcohol use.  Tobacco use.  Drug use.  Emotional well-being.  Home and relationship well-being.  Sexual activity.  Eating habits.  History of falls.  Memory and ability to understand (cognition).  Work and work Statistician.  Reproductive health. Screening  You may have the following tests or measurements:  Height, weight, and BMI.  Blood pressure.  Lipid and cholesterol levels. These may be checked every 5 years, or more frequently if you are over 70 years old.  Skin check.  Lung cancer screening. You may have this screening every year starting at age 34 if you have a 30-pack-year history of smoking and currently smoke or have quit within the past 15 years.  Fecal occult blood test (FOBT) of the stool. You may have this test every year starting at age 101.  Flexible sigmoidoscopy or colonoscopy. You may have a sigmoidoscopy every 5 years or a colonoscopy every 10 years starting at age 61.  Hepatitis C blood test.  Hepatitis B blood test.  Sexually transmitted disease (STD) testing.  Diabetes screening. This is done by checking your blood sugar (glucose) after you have not eaten for a while (fasting). You may have this done every 1-3 years.  Bone density scan. This is done to screen for osteoporosis. You may have this done starting at age 64.  Mammogram. This may be done every 1-2 years. Talk to your health care provider about how often you should have regular mammograms. Talk with your health care provider about your test  results, treatment options, and if necessary, the need for more tests. Vaccines  Your health care provider may recommend certain vaccines, such as:  Influenza vaccine. This is recommended every  year.  Tetanus, diphtheria, and acellular pertussis (Tdap, Td) vaccine. You may need a Td booster every 10 years.  Zoster vaccine. You may need this after age 61.  Pneumococcal 13-valent conjugate (PCV13) vaccine. One dose is recommended after age 69.  Pneumococcal polysaccharide (PPSV23) vaccine. One dose is recommended after age 93. Talk to your health care provider about which screenings and vaccines you need and how often you need them. This information is not intended to replace advice given to you by your health care provider. Make sure you discuss any questions you have with your health care provider. Document Released: 08/02/2015 Document Revised: 03/25/2016 Document Reviewed: 05/07/2015 Elsevier Interactive Patient Education  2017 [place] Prevention in the Home Falls can cause injuries. They can happen to people of all ages. There are many things you can do to make your home safe and to help prevent falls. What can I do on the outside of my home?  Regularly fix the edges of walkways and driveways and fix any cracks.  Remove anything that might make you trip as you walk through a door, such as a raised step or threshold.  Trim any bushes or trees on the path to your home.  Use bright outdoor lighting.  Clear any walking paths of anything that might make someone trip, such as rocks or tools.  Regularly check to see if handrails are loose or broken. Make sure that both sides of any steps have handrails.  Any raised decks and porches should have guardrails on the edges.  Have any leaves, snow, or ice cleared regularly.  Use sand or salt on walking paths during winter.  Clean up any spills in your garage right away. This includes oil or grease spills. What can I do in the bathroom?  Use night lights.  Install grab bars by the toilet and in the tub and shower. Do not use towel bars as grab bars.  Use non-skid mats or decals in the tub or shower.  If you  need to sit down in the shower, use a plastic, non-slip stool.  Keep the floor dry. Clean up any water that spills on the floor as soon as it happens.  Remove soap buildup in the tub or shower regularly.  Attach bath mats securely with double-sided non-slip rug tape.  Do not have throw rugs and other things on the floor that can make you trip. What can I do in the bedroom?  Use night lights.  Make sure that you have a light by your bed that is easy to reach.  Do not use any sheets or blankets that are too big for your bed. They should not hang down onto the floor.  Have a firm chair that has side arms. You can use this for support while you get dressed.  Do not have throw rugs and other things on the floor that can make you trip. What can I do in the kitchen?  Clean up any spills right away.  Avoid walking on wet floors.  Keep items that you use a lot in easy-to-reach places.  If you need to reach something above you, use a strong step stool that has a grab bar.  Keep electrical cords out of the way.  Do not use floor polish or wax that makes  floors slippery. If you must use wax, use non-skid floor wax.  Do not have throw rugs and other things on the floor that can make you trip. What can I do with my stairs?  Do not leave any items on the stairs.  Make sure that there are handrails on both sides of the stairs and use them. Fix handrails that are broken or loose. Make sure that handrails are as long as the stairways.  Check any carpeting to make sure that it is firmly attached to the stairs. Fix any carpet that is loose or worn.  Avoid having throw rugs at the top or bottom of the stairs. If you do have throw rugs, attach them to the floor with carpet tape.  Make sure that you have a light switch at the top of the stairs and the bottom of the stairs. If you do not have them, ask someone to add them for you. What else can I do to help prevent falls?  Wear shoes  that:  Do not have high heels.  Have rubber bottoms.  Are comfortable and fit you well.  Are closed at the toe. Do not wear sandals.  If you use a stepladder:  Make sure that it is fully opened. Do not climb a closed stepladder.  Make sure that both sides of the stepladder are locked into place.  Ask someone to hold it for you, if possible.  Clearly mark and make sure that you can see:  Any grab bars or handrails.  First and last steps.  Where the edge of each step is.  Use tools that help you move around (mobility aids) if they are needed. These include:  Canes.  Walkers.  Scooters.  Crutches.  Turn on the lights when you go into a dark area. Replace any light bulbs as soon as they burn out.  Set up your furniture so you have a clear path. Avoid moving your furniture around.  If any of your floors are uneven, fix them.  If there are any pets around you, be aware of where they are.  Review your medicines with your doctor. Some medicines can make you feel dizzy. This can increase your chance of falling. Ask your doctor what other things that you can do to help prevent falls. This information is not intended to replace advice given to you by your health care provider. Make sure you discuss any questions you have with your health care provider. Document Released: 05/02/2009 Document Revised: 12/12/2015 Document Reviewed: 08/10/2014 Elsevier Interactive Patient Education  2017 [employer].

## 2017-04-01 ENCOUNTER — Other Ambulatory Visit: Payer: Self-pay | Admitting: Family Medicine

## 2017-04-01 DIAGNOSIS — M1990 Unspecified osteoarthritis, unspecified site: Secondary | ICD-10-CM

## 2017-04-01 NOTE — Telephone Encounter (Signed)
Please call in tramadol.  

## 2017-04-01 NOTE — Telephone Encounter (Signed)
Rx called in to pharmacy. 

## 2017-04-01 NOTE — Telephone Encounter (Signed)
Pharmacy requesting refills. Thanks!  

## 2017-04-03 ENCOUNTER — Encounter: Payer: Self-pay | Admitting: Family Medicine

## 2017-04-18 ENCOUNTER — Other Ambulatory Visit: Payer: Self-pay | Admitting: Family Medicine

## 2017-04-19 NOTE — Telephone Encounter (Signed)
AWV completed °

## 2017-04-19 NOTE — Telephone Encounter (Signed)
Please advise patient have sent in 90 day refills, but she is due for follow up o/v and needs to schedule within the next month.

## 2017-04-19 NOTE — Telephone Encounter (Signed)
L/M stating below.  

## 2017-04-21 NOTE — Telephone Encounter (Signed)
Visit completed.

## 2017-04-25 ENCOUNTER — Other Ambulatory Visit: Payer: Self-pay | Admitting: Family Medicine

## 2017-04-26 DIAGNOSIS — L4050 Arthropathic psoriasis, unspecified: Secondary | ICD-10-CM | POA: Diagnosis not present

## 2017-04-26 DIAGNOSIS — Z96641 Presence of right artificial hip joint: Secondary | ICD-10-CM | POA: Diagnosis not present

## 2017-04-26 DIAGNOSIS — L409 Psoriasis, unspecified: Secondary | ICD-10-CM | POA: Diagnosis not present

## 2017-04-26 DIAGNOSIS — Z79899 Other long term (current) drug therapy: Secondary | ICD-10-CM | POA: Diagnosis not present

## 2017-04-26 DIAGNOSIS — M810 Age-related osteoporosis without current pathological fracture: Secondary | ICD-10-CM | POA: Diagnosis not present

## 2017-05-07 ENCOUNTER — Other Ambulatory Visit (INDEPENDENT_AMBULATORY_CARE_PROVIDER_SITE_OTHER): Payer: PPO

## 2017-05-07 ENCOUNTER — Ambulatory Visit (INDEPENDENT_AMBULATORY_CARE_PROVIDER_SITE_OTHER): Payer: PPO | Admitting: Vascular Surgery

## 2017-07-15 ENCOUNTER — Encounter: Payer: Self-pay | Admitting: Family Medicine

## 2017-07-15 ENCOUNTER — Ambulatory Visit (INDEPENDENT_AMBULATORY_CARE_PROVIDER_SITE_OTHER): Payer: PPO | Admitting: Family Medicine

## 2017-07-15 VITALS — BP 120/80 | HR 112 | Temp 97.6°F | Resp 16 | Ht 61.0 in | Wt 166.0 lb

## 2017-07-15 DIAGNOSIS — Z23 Encounter for immunization: Secondary | ICD-10-CM | POA: Diagnosis not present

## 2017-07-15 DIAGNOSIS — E785 Hyperlipidemia, unspecified: Secondary | ICD-10-CM | POA: Diagnosis not present

## 2017-07-15 DIAGNOSIS — Z0000 Encounter for general adult medical examination without abnormal findings: Secondary | ICD-10-CM

## 2017-07-15 DIAGNOSIS — M1990 Unspecified osteoarthritis, unspecified site: Secondary | ICD-10-CM

## 2017-07-15 DIAGNOSIS — L409 Psoriasis, unspecified: Secondary | ICD-10-CM | POA: Diagnosis not present

## 2017-07-15 DIAGNOSIS — Z720 Tobacco use: Secondary | ICD-10-CM | POA: Diagnosis not present

## 2017-07-15 DIAGNOSIS — I10 Essential (primary) hypertension: Secondary | ICD-10-CM | POA: Diagnosis not present

## 2017-07-15 DIAGNOSIS — R0609 Other forms of dyspnea: Secondary | ICD-10-CM

## 2017-07-15 LAB — CBC WITH DIFFERENTIAL/PLATELET
BASOS ABS: 21 {cells}/uL (ref 0–200)
Basophils Relative: 0.3 %
EOS ABS: 97 {cells}/uL (ref 15–500)
Eosinophils Relative: 1.4 %
HCT: 44.2 % (ref 35.0–45.0)
Hemoglobin: 14.8 g/dL (ref 11.7–15.5)
Lymphs Abs: 1428 cells/uL (ref 850–3900)
MCH: 28.3 pg (ref 27.0–33.0)
MCHC: 33.5 g/dL (ref 32.0–36.0)
MCV: 84.5 fL (ref 80.0–100.0)
MONOS PCT: 6.9 %
MPV: 13.1 fL — ABNORMAL HIGH (ref 7.5–12.5)
Neutro Abs: 4878 cells/uL (ref 1500–7800)
Neutrophils Relative %: 70.7 %
PLATELETS: 192 10*3/uL (ref 140–400)
RBC: 5.23 10*6/uL — ABNORMAL HIGH (ref 3.80–5.10)
RDW: 14.8 % (ref 11.0–15.0)
TOTAL LYMPHOCYTE: 20.7 %
WBC mixed population: 476 cells/uL (ref 200–950)
WBC: 6.9 10*3/uL (ref 3.8–10.8)

## 2017-07-15 LAB — COMPLETE METABOLIC PANEL WITH GFR
AG RATIO: 2 (calc) (ref 1.0–2.5)
ALKALINE PHOSPHATASE (APISO): 132 U/L — AB (ref 33–130)
ALT: 11 U/L (ref 6–29)
AST: 18 U/L (ref 10–35)
Albumin: 4.3 g/dL (ref 3.6–5.1)
BILIRUBIN TOTAL: 0.6 mg/dL (ref 0.2–1.2)
BUN: 10 mg/dL (ref 7–25)
CHLORIDE: 101 mmol/L (ref 98–110)
CO2: 30 mmol/L (ref 20–32)
Calcium: 9.5 mg/dL (ref 8.6–10.4)
Creat: 0.85 mg/dL (ref 0.60–0.93)
GFR, EST AFRICAN AMERICAN: 79 mL/min/{1.73_m2} (ref >=60)
GFR, Est Non African American: 68 mL/min/{1.73_m2} (ref >=60)
GLUCOSE: 86 mg/dL (ref 65–99)
Globulin: 2.2 g/dL (calc) (ref 1.9–3.7)
POTASSIUM: 4 mmol/L (ref 3.5–5.3)
Sodium: 139 mmol/L (ref 135–146)
Total Protein: 6.5 g/dL (ref 6.1–8.1)

## 2017-07-15 LAB — LIPID PANEL
CHOL/HDL RATIO: 3.7 (calc) (ref <5.0)
CHOLESTEROL: 197 mg/dL (ref <200)
HDL: 53 mg/dL (ref >50)
LDL Cholesterol (Calc): 112 mg/dL (calc) — ABNORMAL HIGH
Non-HDL Cholesterol (Calc): 144 mg/dL (calc) — ABNORMAL HIGH (ref <130)
Triglycerides: 200 mg/dL — ABNORMAL HIGH (ref <150)

## 2017-07-15 MED ORDER — GABAPENTIN 300 MG PO CAPS: ORAL_CAPSULE | ORAL | 1 refills | Status: DC

## 2017-07-15 MED ORDER — CLOBETASOL PROPIONATE 0.05 % EX SOLN: 1.0000 "application " | Freq: Two times a day (BID) | CUTANEOUS | 0 refills | Status: DC

## 2017-07-15 MED ORDER — TRAMADOL HCL 50 MG PO TABS: 50.0000 mg | ORAL_TABLET | Freq: Four times a day (QID) | ORAL | 5 refills | Status: DC | PRN

## 2017-07-15 MED ORDER — SIMVASTATIN 10 MG PO TABS: 10.0000 mg | ORAL_TABLET | Freq: Every day | ORAL | 3 refills | Status: DC

## 2017-07-15 MED ORDER — METOPROLOL SUCCINATE ER 50 MG PO TB24: 50.0000 mg | ORAL_TABLET | Freq: Every day | ORAL | 3 refills | Status: DC

## 2017-07-15 MED ORDER — BETAMETHASONE DIPROPIONATE 0.05 % EX OINT: TOPICAL_OINTMENT | Freq: Two times a day (BID) | CUTANEOUS | 5 refills | Status: DC

## 2017-07-15 MED ORDER — PANTOPRAZOLE SODIUM 20 MG PO TBEC: 20.0000 mg | DELAYED_RELEASE_TABLET | Freq: Two times a day (BID) | ORAL | 3 refills | Status: DC

## 2017-07-15 MED ORDER — HYDROCHLOROTHIAZIDE 12.5 MG PO TABS: 12.5000 mg | ORAL_TABLET | Freq: Every day | ORAL | 3 refills | Status: DC

## 2017-07-15 NOTE — Assessment & Plan Note (Signed)
Well controlled Continue current meds Check CMP F/u in 6 months

## 2017-07-15 NOTE — Assessment & Plan Note (Signed)
Worsening over last several months No referral placed as patient is already established with cardiology and will call to make f/u appt to discuss further Again stressed importance of quitting smoking

## 2017-07-15 NOTE — Assessment & Plan Note (Signed)
Discussed importance of cessation and risks of continuing to smoke for 3-5 minutes Patient is not interested in quitting currently but may be in the future

## 2017-07-15 NOTE — Assessment & Plan Note (Signed)
Managed by Rheum for psoriasis and PsA on Otezla Still has plaques on R wrist, L leg, and scalp Continue current steroid ointment Trial of clobetasol topical solution for scalp Advised on emollients

## 2017-07-15 NOTE — Progress Notes (Signed)
Patient: Mariah [name], Female    DOB: Mar 14, 1946, 71 y.o.   MRN: [id number] Visit Date: 07/15/2017  Today's Provider: Lavon Paganini, MD   I, Martha Clan, CMA, am acting as scribe for Lavon Paganini, MD.   Chief Complaint  Patient presents with  . Annual Exam   Subjective:    Annual physical exam Mariah [name] is a 71 y.o. female who presents today for health maintenance and complete physical. She feels fairly well. She is c/o new onset SOB, which is intermittent. Non-exertional, and denies orthopnea. Denies chest pain. She reports exercising none. She reports she is sleeping fairly well.  Had AWV with [facility] on 02/25/2017 - reviewed documentation Last colonoscopy- 08/10/2016- tubular adenoma. Repeat 07/2019 Last mammogram- had mass excised 11/24/2016, and was negative. ----------------------------------------------------------------- Currently smoking 1 PPD.  States she was able to quit for 3 weeks when in rehab after hip replacement  Reports DOE intermittently worsening x few months.  No CP with episodes.  Has previously seen cardiology at [facility].  Has had previous stress test.  Review of Systems  Constitutional: Negative.   HENT: Positive for congestion, dental problem, postnasal drip, rhinorrhea and sinus pressure. Negative for drooling, ear discharge, ear pain, facial swelling, hearing loss, mouth sores, nosebleeds, sinus pain, sneezing, sore throat, tinnitus, trouble swallowing and voice change.   Eyes: Negative.   Respiratory: Positive for shortness of breath (sometimes). Negative for apnea, cough, choking, chest tightness, wheezing and stridor.   Cardiovascular: Negative.   Gastrointestinal: Positive for constipation (occasional) and diarrhea (occasional). Negative for abdominal distention, abdominal pain, anal bleeding, blood in stool, nausea, rectal pain and vomiting.  Endocrine: Negative.   Genitourinary: Negative.   Musculoskeletal: Positive for  arthralgias, back pain and joint swelling. Negative for gait problem, myalgias, neck pain and neck stiffness.  Skin: Negative.   Allergic/Immunologic: Negative.   Neurological: Negative.   Hematological: Negative.   Psychiatric/Behavioral: Negative.     Social History      She  reports that she has been smoking.  She has been smoking about 1.00 pack per day for the past 0.00 years. she has never used smokeless tobacco. She reports that she drinks alcohol. She reports that she does not use drugs.       Social History   Socioeconomic History  . Marital status: Divorced    Spouse name: None  . Number of children: 0  . Years of education: 80  . Highest education level: Associate degree: occupational, Hotel manager, or vocational program  Social Needs  . Financial resource strain: Somewhat hard  . Food insecurity - worry: Sometimes true  . Food insecurity - inability: Sometimes true  . Transportation needs - medical: No  . Transportation needs - non-medical: No  Occupational History    Employer: [employer]    Comment: part time  Tobacco Use  . Smoking status: Current Every Day Smoker    Packs/day: 1.00    Years: 0.00    Pack years: 0.00  . Smokeless tobacco: Never Used  Substance and Sexual Activity  . Alcohol use: Yes    Comment: occasionally glass of wine 1-2 per month.  . Drug use: No  . Sexual activity: None  Other Topics Concern  . None  Social History Narrative  . None    Past Medical History:  Diagnosis Date  . Abdominal aortic aneurysm (AAA) ([place])   . Arthritis   . COPD (chronic obstructive pulmonary disease) ([place])   .  GERD (gastroesophageal reflux disease)   . Headache    sinus  . History of adenomatous polyp of colon   . Hyperlipidemia   . Hypertension   . Psoriasis   . Right lumbar radiculitis   . Vitamin D deficiency      Patient Active Problem List   Diagnosis Date Noted  . Cholelithiasis 03/16/2016  . Peripheral arterial disease ([place])  02/05/2016  . 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 Abdominal aortic aneurysm (AAA) ([place]) 01/13/2016  . 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 Nerve root inflammation 10/25/2015  . Psoriatic arthritis ([place]) 10/07/2015  . 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 Arthralgia of hip 08/12/2015  . Arthralgia of multiple joints 08/12/2015  . Age related osteoporosis 08/12/2015  . Chronic obstructive pulmonary disease ([place]) 06/24/2015  . Current tobacco use 06/24/2015  . Senile purpura ([place]) 06/24/2015  . Elevated alkaline phosphatase level 06/24/2015  . Atypical chest pain 06/18/2015  . Abnormal serum level of alkaline phosphatase 06/18/2015  . Elevated hemoglobin ([place]) 06/18/2015  . Fatigue 06/18/2015  . Fistula 06/18/2015  . Blood glucose elevated 06/18/2015  . Gastro-esophageal reflux disease without esophagitis 06/18/2015  . Restless leg 06/18/2015  . Leg pain, right 06/18/2015  . 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 Avitaminosis D 06/18/2015  . Mechanical and motor problems with internal organs 10/11/2009  . Hypercholesteremia 10/11/2009  . Arthritis, degenerative 09/25/2009  . Essential (primary) hypertension 09/25/2009  . Cephalalgia 09/25/2009  . Psoriasis 09/25/2009  . Compulsive tobacco user syndrome 09/25/2009    Past Surgical History:  Procedure Laterality Date  . APPENDECTOMY  2000  . BREAST BIOPSY Right 09/2016   radial scar  . BREAST LUMPECTOMY Right 11/24/2016   excision for radial scar  . BREAST LUMPECTOMY WITH NEEDLE LOCALIZATION Right 11/24/2016   Procedure: BREAST LUMPECTOMY WITH NEEDLE LOCALIZATION;  Surgeon: Leonie Green, MD;  Location: [facility] ORS;  Service: General;  Laterality: Right;  . COLONOSCOPY WITH PROPOFOL N/A 08/10/2016   Procedure: COLONOSCOPY WITH PROPOFOL;  Surgeon: Manya Silvas, MD;  Location: [facility] ENDOSCOPY;  Service: Endoscopy;  Laterality: N/A;  . ESOPHAGOGASTRODUODENOSCOPY (EGD) WITH PROPOFOL N/A 08/10/2016   Procedure:  ESOPHAGOGASTRODUODENOSCOPY (EGD) WITH PROPOFOL;  Surgeon: Manya Silvas, MD;  Location: [facility] ENDOSCOPY;  Service: Endoscopy;  Laterality: N/A;  . FOOT SURGERY Bilateral   . JOINT REPLACEMENT Right 01/27/2016   hip  . 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 TOTAL HIP ARTHROPLASTY Right 01/27/2016   Procedure: TOTAL HIP ARTHROPLASTY;  Surgeon: Dereck Leep, MD;  Location: [facility] ORS;  Service: Orthopedics;  Laterality: Right;    Family History        Family Status  Relation Name Status  . Mother  Deceased  . Father  Deceased at age 37  . Sister  Alive  . Mat Aunt  (Not Specified)  . Ethlyn Daniels  (Not Specified)  . Annamarie Major  (Not Specified)  . PGM  (Not Specified)  . Neg Hx  (Not Specified)        Her family history includes Arthritis in her father; COPD in her sister; Cancer in her mother; Colon cancer in her paternal aunt, paternal grandmother, and paternal uncle; Fibromyalgia in her sister; Heart disease in her father; Osteoarthritis in her maternal aunt and sister.     No Known Allergies   Current Outpatient Medications:  .  Apremilast (OTEZLA) 30 MG TABS, Take 1 tablet by mouth 2 (two) times daily., Disp: , Rfl:  .  aspirin 81 MG chewable tablet, Chew 81 mg by mouth once. TAKES THE CHEWABLE IN THE EVENING AND THE ENTERIC COATED IN THE MORNING , Disp: , Rfl:  .  aspirin 81 MG EC tablet, Take 81 mg by mouth daily. TAKES THE ENTERIC COATED IN THE MORNING AND THE CHEWABLE ASPIRIN IN THE EVENING, Disp: , Rfl:  .  betamethasone dipropionate (DIPROLENE) 0.05 % ointment, Apply topically 2 (two) times daily. (Patient taking differently: Apply 1 application topically 2 (two) times daily. ), Disp: 45 g, Rfl: 5 .  Cholecalciferol (VITAMIN D3) 2000 units TABS, Take 2,000 Units by mouth daily., Disp: , Rfl:  .  Cholestyramine POWD, 1 Package by Does not apply route as needed., Disp: , Rfl:  .  diphenhydrAMINE (SOMINEX) 25 MG tablet, Take 25 mg by mouth as needed for itching. , Disp: , Rfl:  .   gabapentin (NEURONTIN) 300 MG capsule, TAKE 1 CAPSULE (300 MG TOTAL) BY MOUTH 2 (TWO) TIMES DAILY., Disp: 180 capsule, Rfl: 1 .  hydrochlorothiazide (HYDRODIURIL) 12.5 MG tablet, TAKE 1 TABLET BY MOUTH EVERY DAY, Disp: 90 tablet, Rfl: 0 .  metoprolol succinate (TOPROL-XL) 50 MG 24 hr tablet, TAKE 1 TABLET EVERY DAY, Disp: 90 tablet, Rfl: 0 .  pantoprazole (PROTONIX) 20 MG tablet, Take 20 mg by mouth daily before supper., Disp: , Rfl:  .  pantoprazole (PROTONIX) 40 MG tablet, Take 1 tablet (40 mg total) by mouth daily. (Patient taking differently: Take 40 mg by mouth daily. ), Disp: 90 tablet, Rfl: 4 .  polyethylene glycol powder (GLYCOLAX/MIRALAX) powder, Take 17 g by mouth 2 (two) times daily as needed., Disp: 3350 g, Rfl: 1 .  simvastatin (ZOCOR) 10 MG tablet, TAKE 1 TABLET AT BEDTIME, Disp: 90 tablet, Rfl: 0 .  traMADol (ULTRAM) 50 MG tablet, TAKE 1 TABLET BY MOUTH 4 TIMES A DAY, Disp: 100 tablet, Rfl: 3   Patient Care Team: [name], MD as PCP - General (Family Medicine) Marry Guan, Laurice Record, MD as Consulting Physician (Orthopedic Surgery) Algernon Huxley, MD as Referring Physician (Vascular Surgery)      Objective:   Vitals: BP 120/80 (BP Location: Left Arm, Patient Position: Sitting, Cuff Size: Normal)   Pulse (!) 112   Temp 97.6 F (36.4 C) (Oral)   Resp 16   Ht 5\' 1"  (1.549 m)   Wt 166 lb (75.3 kg)   BMI 31.37 kg/m    Vitals:   07/15/17 1412  BP: 120/80  Pulse: (!) 112  Resp: 16  Temp: 97.6 F (36.4 C)  TempSrc: Oral  Weight: 166 lb (75.3 kg)  Height: 5\' 1"  (1.549 m)     Physical Exam  Constitutional: She is oriented to person, place, and time. She appears well-developed and well-nourished. No distress.  HENT:  Head: Normocephalic and atraumatic.  Right Ear: External ear normal.  Left Ear: External ear normal.  Nose: Nose normal.  Mouth/Throat: Oropharynx is clear and moist.  Eyes: Conjunctivae and EOM are normal. Pupils are equal, round, and reactive to  light. No scleral icterus.  Neck: Neck supple. No thyromegaly present.  Cardiovascular: Normal rate, regular rhythm, normal heart sounds and intact distal pulses.  No murmur heard. Pulmonary/Chest: Effort normal and breath sounds normal. No respiratory distress. She has no wheezes. She has no rales.  Breasts: breasts appear normal, no suspicious masses, no skin or nipple changes or axillary nodes.  R breast with lumpectomy scar  Abdominal: Soft. Bowel sounds are normal. She exhibits no distension. There is  no tenderness. There is no rebound and no guarding.  Musculoskeletal: She exhibits no edema or deformity.  Lymphadenopathy:    She has no cervical adenopathy.  Neurological: She is alert and oriented to person, place, and time.  Skin: Skin is warm and dry. No rash noted.  Psychiatric: She has a normal mood and affect. Her behavior is normal.  Vitals reviewed.    Depression Screen PHQ 2/9 Scores 02/25/2017 02/25/2017 06/24/2015  PHQ - 2 Score 0 0 0  PHQ- 9 Score 4 - -      Assessment & Plan:     Routine Health Maintenance and Physical Exam  Exercise Activities and Dietary recommendations Goals    None      Immunization History  Administered Date(s) Administered  . Influenza, High Dose Seasonal PF 06/24/2015, 03/16/2016  . Pneumococcal Conjugate-13 06/24/2015  . Pneumococcal Polysaccharide-23 03/26/2011  . Tdap 03/26/2011    Health Maintenance  Topic Date Due  . INFLUENZA VACCINE  02/17/2017  . MAMMOGRAM  09/14/2018  . TETANUS/TDAP  03/25/2021  . COLONOSCOPY  08/10/2026  . DEXA SCAN  Completed  . Hepatitis C Screening  Completed  . PNA vac Low Risk Adult  Completed     Discussed health benefits of physical activity, and encouraged her to engage in regular exercise appropriate for her age and condition.     Problem List Items Addressed This Visit      Cardiovascular and Mediastinum   Essential (primary) hypertension    Well controlled Continue current  meds Check CMP F/u in 6 months      Relevant Medications   hydrochlorothiazide (HYDRODIURIL) 12.5 MG tablet   simvastatin (ZOCOR) 10 MG tablet   metoprolol succinate (TOPROL-XL) 50 MG 24 hr tablet     Musculoskeletal and Integument   Arthritis, degenerative   Relevant Medications   traMADol (ULTRAM) 50 MG tablet   Psoriasis    Managed by Rheum for psoriasis and PsA on Otezla Still has plaques on R wrist, L leg, and scalp Continue current steroid ointment Trial of clobetasol topical solution for scalp Advised on emollients      Relevant Medications   betamethasone dipropionate (DIPROLENE) 0.05 % ointment     Other   Current tobacco use    Discussed importance of cessation and risks of continuing to smoke for 3-5 minutes Patient is not interested in quitting currently but may be in the future      Dyspnea on exertion    Worsening over last several months No referral placed as patient is already established with cardiology and will call to make f/u appt to discuss further Again stressed importance of quitting smoking       Other Visit Diagnoses    Encounter for annual physical exam    -  Primary   Relevant Orders   CBC w/Diff/Platelet   Comprehensive metabolic panel   Lipid panel   Flu vaccine need       Relevant Orders   Flu vaccine HIGH DOSE PF (Completed)   Hyperlipidemia, unspecified hyperlipidemia type       Relevant Medications   hydrochlorothiazide (HYDRODIURIL) 12.5 MG tablet   simvastatin (ZOCOR) 10 MG tablet   metoprolol succinate (TOPROL-XL) 50 MG 24 hr tablet   Other Relevant Orders   Comprehensive metabolic panel   Lipid panel      Return in about 6 months (around 01/13/2018) for chronic disease f/u.  -------------------------------------------------------------------- The entirety of the information documented in the History of Present Illness, Review  of Systems and Physical Exam were personally obtained by me. Portions of this information were  initially documented by Raquel Sarna Ratchford, CMA and reviewed by me for thoroughness and accuracy.    [name], MD, MPH [facility] 07/15/2017 3:47 PM

## 2017-07-15 NOTE — Patient Instructions (Signed)
Preventive Care 40 Years and Older, Female Preventive care refers to lifestyle choices and visits with your health care provider that can promote health and wellness. What does preventive care include?  A yearly physical exam. This is also called an annual well check.  Dental exams once or twice a year.  Routine eye exams. Ask your health care provider how often you should have your eyes checked.  Personal lifestyle choices, including: ? Daily care of your teeth and gums. ? Regular physical activity. ? Eating a healthy diet. ? Avoiding tobacco and drug use. ? Limiting alcohol use. ? Practicing safe sex. ? Taking low-dose aspirin every day. ? Taking vitamin and mineral supplements as recommended by your health care provider. What happens during an annual well check? The services and screenings done by your health care provider during your annual well check will depend on your age, overall health, lifestyle risk factors, and family history of disease. Counseling Your health care provider may ask you questions about your:  Alcohol use.  Tobacco use.  Drug use.  Emotional well-being.  Home and relationship well-being.  Sexual activity.  Eating habits.  History of falls.  Memory and ability to understand (cognition).  Work and work Statistician.  Reproductive health.  Screening You may have the following tests or measurements:  Height, weight, and BMI.  Blood pressure.  Lipid and cholesterol levels. These may be checked every 5 years, or more frequently if you are over 46 years old.  Skin check.  Lung cancer screening. You may have this screening every year starting at age 67 if you have a 30-pack-year history of smoking and currently smoke or have quit within the past 15 years.  Fecal occult blood test (FOBT) of the stool. You may have this test every year starting at age 52.  Flexible sigmoidoscopy or colonoscopy. You may have a sigmoidoscopy every 5 years or  a colonoscopy every 10 years starting at age 68.  Hepatitis C blood test.  Hepatitis B blood test.  Sexually transmitted disease (STD) testing.  Diabetes screening. This is done by checking your blood sugar (glucose) after you have not eaten for a while (fasting). You may have this done every 1-3 years.  Bone density scan. This is done to screen for osteoporosis. You may have this done starting at age 80.  Mammogram. This may be done every 1-2 years. Talk to your health care provider about how often you should have regular mammograms.  Talk with your health care provider about your test results, treatment options, and if necessary, the need for more tests. Vaccines Your health care provider may recommend certain vaccines, such as:  Influenza vaccine. This is recommended every year.  Tetanus, diphtheria, and acellular pertussis (Tdap, Td) vaccine. You may need a Td booster every 10 years.  Varicella vaccine. You may need this if you have not been vaccinated.  Zoster vaccine. You may need this after age 41.  Measles, mumps, and rubella (MMR) vaccine. You may need at least one dose of MMR if you were born in 1957 or later. You may also need a second dose.  Pneumococcal 13-valent conjugate (PCV13) vaccine. One dose is recommended after age 50.  Pneumococcal polysaccharide (PPSV23) vaccine. One dose is recommended after age 65.  Meningococcal vaccine. You may need this if you have certain conditions.  Hepatitis A vaccine. You may need this if you have certain conditions or if you travel or work in places where you may be exposed to hepatitis  A.  Hepatitis B vaccine. You may need this if you have certain conditions or if you travel or work in places where you may be exposed to hepatitis B.  Haemophilus influenzae type b (Hib) vaccine. You may need this if you have certain conditions.  Talk to your health care provider about which screenings and vaccines you need and how often you  need them. This information is not intended to replace advice given to you by your health care provider. Make sure you discuss any questions you have with your health care provider. Document Released: 08/02/2015 Document Revised: 03/25/2016 Document Reviewed: 05/07/2015 Elsevier Interactive Patient Education  [employer].

## 2017-07-16 ENCOUNTER — Telehealth: Payer: Self-pay

## 2017-07-16 NOTE — Telephone Encounter (Signed)
lmtcb

## 2017-07-16 NOTE — Telephone Encounter (Signed)
-----   Message from [name], MD sent at 07/16/2017  2:38 PM EST ----- Normal blood counts, kidney function, liver function, electrolytes.  Cholesterol is not at goal. Want LDL <100.  If ok with patient, recommend increasing simvastatin to 20mg  daily.  Ok to send new Rx for 90 day supply if patient agrees to change.  [name], MD, MPH [facility] 07/16/2017 2:38 PM

## 2017-07-19 MED ORDER — SIMVASTATIN 20 MG PO TABS: 20.0000 mg | ORAL_TABLET | Freq: Every day | ORAL | 0 refills | Status: DC

## 2017-07-19 NOTE — Telephone Encounter (Signed)
Pt agrees to increase medication. She states she is experiencing SOB in the mornings. She states this could be from "inflamed sinuses". She also notes she noticed possible "white mold" on her ceiling in the bathroom, which could be causing SOB. Is requesting an inhaler for this. Please advise.

## 2017-07-19 NOTE — Telephone Encounter (Signed)
We can try to call her again with lab results.  [name], MD, MPH [facility] 07/19/2017 11:10 AM

## 2017-07-19 NOTE — Telephone Encounter (Signed)
Patient called you back but evidently you were in a room w a patient. Please call her.

## 2017-07-21 MED ORDER — ALBUTEROL SULFATE HFA 108 (90 BASE) MCG/ACT IN AERS: 1.0000 | INHALATION_SPRAY | Freq: Four times a day (QID) | RESPIRATORY_TRACT | 0 refills | Status: DC | PRN

## 2017-07-21 NOTE — Telephone Encounter (Signed)
LMOVM for pt to return call 

## 2017-07-21 NOTE — Telephone Encounter (Signed)
Albuterol inhaler sent to pharmacy to try.  If continues to have SOB, needs to be seen for evaluation.  [name], MD, MPH [facility] 07/21/2017 3:01 PM

## 2017-07-22 NOTE — Telephone Encounter (Signed)
Patient advised as below.  

## 2017-08-16 ENCOUNTER — Other Ambulatory Visit: Payer: Self-pay | Admitting: Family Medicine

## 2017-08-16 MED ORDER — ALBUTEROL SULFATE HFA 108 (90 BASE) MCG/ACT IN AERS: 1.0000 | INHALATION_SPRAY | Freq: Four times a day (QID) | RESPIRATORY_TRACT | 5 refills | Status: DC | PRN

## 2017-08-16 NOTE — Telephone Encounter (Signed)
Pt contacted office for refill request on the following medications:  albuterol (PROVENTIL HFA;VENTOLIN HFA) 108 (90 Base) MCG/ACT inhaler   [facility] [address]  Pt is seeing Tawanna Sat tomorrow 08/17/17 b/c she thinks she might have a sinus infection. Please advise. Thanks TNP

## 2017-08-16 NOTE — Telephone Encounter (Signed)
OK to refill

## 2017-08-17 ENCOUNTER — Encounter: Payer: Self-pay | Admitting: Physician Assistant

## 2017-08-17 ENCOUNTER — Ambulatory Visit (INDEPENDENT_AMBULATORY_CARE_PROVIDER_SITE_OTHER): Payer: PPO | Admitting: Physician Assistant

## 2017-08-17 VITALS — BP 100/80 | HR 84 | Temp 98.1°F | Resp 16 | Wt 164.0 lb

## 2017-08-17 DIAGNOSIS — J0140 Acute pansinusitis, unspecified: Secondary | ICD-10-CM | POA: Diagnosis not present

## 2017-08-17 MED ORDER — AMOXICILLIN-POT CLAVULANATE 875-125 MG PO TABS: 1.0000 | ORAL_TABLET | Freq: Two times a day (BID) | ORAL | 0 refills | Status: DC

## 2017-08-17 MED ORDER — PREDNISONE 10 MG (21) PO TBPK: ORAL_TABLET | ORAL | 0 refills | Status: DC

## 2017-08-17 NOTE — Patient Instructions (Signed)

## 2017-08-17 NOTE — Progress Notes (Signed)
Patient: Mariah [name] Female    DOB: 1946-02-17   72 y.o.   MRN: [id number] Visit Date: 08/17/2017  Today's Provider: Mar Daring, PA-C   Chief Complaint  Patient presents with  . URI   Subjective:    HPI Upper Respiratory Infection: Patient complains of symptoms of a URI, possible sinusitis. Symptoms include congestion and cough. Onset of symptoms was a few days ago, gradually worsening since that time. She also c/o congestion, nasal congestion, productive cough with  yellow colored sputum, shortness of breath and wheezing for the past few weeks .  She is drinking plenty of fluids. Evaluation to date: none. Treatment to date: antihistamines and decongestants.     No Known Allergies   Current Outpatient Medications:  .  albuterol (PROVENTIL HFA;VENTOLIN HFA) 108 (90 Base) MCG/ACT inhaler, Inhale 1-2 puffs into the lungs every 6 (six) hours as needed for wheezing or shortness of breath., Disp: 1 Inhaler, Rfl: 5 .  Apremilast (OTEZLA) 30 MG TABS, Take 1 tablet by mouth 2 (two) times daily., Disp: , Rfl:  .  aspirin 81 MG chewable tablet, Chew 81 mg by mouth once. TAKES THE CHEWABLE IN THE EVENING AND THE ENTERIC COATED IN THE MORNING , Disp: , Rfl:  .  aspirin 81 MG EC tablet, Take 81 mg by mouth daily. TAKES THE ENTERIC COATED IN THE MORNING AND THE CHEWABLE ASPIRIN IN THE EVENING, Disp: , Rfl:  .  betamethasone dipropionate (DIPROLENE) 0.05 % ointment, Apply topically 2 (two) times daily., Disp: 45 g, Rfl: 5 .  Cholecalciferol (VITAMIN D3) 2000 units TABS, Take 2,000 Units by mouth daily., Disp: , Rfl:  .  Cholestyramine POWD, 1 Package by Does not apply route as needed., Disp: , Rfl:  .  clobetasol (TEMOVATE) 0.05 % external solution, Apply 1 application topically 2 (two) times daily., Disp: 50 mL, Rfl: 0 .  diphenhydrAMINE (SOMINEX) 25 MG tablet, Take 25 mg by mouth as needed for itching. , Disp: , Rfl:  .  gabapentin (NEURONTIN) 300 MG capsule, TAKE 1 CAPSULE (300 MG  TOTAL) BY MOUTH 2 (TWO) TIMES DAILY., Disp: 180 capsule, Rfl: 1 .  hydrochlorothiazide (HYDRODIURIL) 12.5 MG tablet, Take 1 tablet (12.5 mg total) by mouth daily., Disp: 90 tablet, Rfl: 3 .  metoprolol succinate (TOPROL-XL) 50 MG 24 hr tablet, Take 1 tablet (50 mg total) by mouth daily. Take with or immediately following a meal., Disp: 90 tablet, Rfl: 3 .  pantoprazole (PROTONIX) 20 MG tablet, Take 1 tablet (20 mg total) by mouth 2 (two) times daily before a meal., Disp: 180 tablet, Rfl: 3 .  polyethylene glycol powder (GLYCOLAX/MIRALAX) powder, Take 17 g by mouth 2 (two) times daily as needed., Disp: 3350 g, Rfl: 1 .  simvastatin (ZOCOR) 20 MG tablet, Take 1 tablet (20 mg total) by mouth at bedtime., Disp: 90 tablet, Rfl: 0 .  traMADol (ULTRAM) 50 MG tablet, Take 1 tablet (50 mg total) by mouth every 6 (six) hours as needed for moderate pain or severe pain., Disp: 100 tablet, Rfl: 5  Review of Systems  Constitutional: Positive for fatigue. Negative for fever.  Respiratory: Positive for cough, choking, chest tightness and shortness of breath. Negative for wheezing.   Cardiovascular: Negative for chest pain, palpitations and leg swelling.  Gastrointestinal: Negative for abdominal pain and nausea.  Neurological: Positive for headaches.    Social History   Tobacco Use  . Smoking status: Current Every Day Smoker    Packs/day:  1.00    Years: 0.00    Pack years: 0.00  . Smokeless tobacco: Never Used  Substance Use Topics  . Alcohol use: Yes    Comment: occasionally glass of wine 1-2 per month.   Objective:   BP 100/80 (BP Location: Left Arm, Patient Position: Sitting, Cuff Size: Normal)   Pulse 84   Temp 98.1 F (36.7 C) (Oral)   Resp 16   Wt 164 lb (74.4 kg)   SpO2 98%   BMI 30.99 kg/m  Vitals:   08/17/17 1140  BP: 100/80  Pulse: 84  Resp: 16  Temp: 98.1 F (36.7 C)  TempSrc: Oral  SpO2: 98%  Weight: 164 lb (74.4 kg)     Physical Exam  Constitutional: She appears  well-developed and well-nourished. No distress.  HENT:  Head: Normocephalic and atraumatic.  Right Ear: Hearing, tympanic membrane, external ear and ear canal normal.  Left Ear: Hearing, tympanic membrane, external ear and ear canal normal.  Nose: Right sinus exhibits maxillary sinus tenderness and frontal sinus tenderness. Left sinus exhibits maxillary sinus tenderness and frontal sinus tenderness.  Mouth/Throat: Uvula is midline, oropharynx is clear and moist and mucous membranes are normal. No oropharyngeal exudate.  Neck: Normal range of motion. Neck supple. No tracheal deviation present. No thyromegaly present.  Cardiovascular: Normal rate, regular rhythm and normal heart sounds. Exam reveals no gallop and no friction rub.  No murmur heard. Pulmonary/Chest: Effort normal and breath sounds normal. No stridor. No respiratory distress. She has no wheezes. She has no rales.  Lymphadenopathy:    She has no cervical adenopathy.  Skin: She is not diaphoretic.  Vitals reviewed.       Assessment & Plan:     1. Acute pansinusitis, recurrence not specified Worsening symptoms that have not responded to OTC medications. Will give augmentin and prednisone as below. Continue allergy medications. Stay well hydrated and get plenty of rest. Call if no symptom improvement or if symptoms worsen. - amoxicillin-clavulanate (AUGMENTIN) 875-125 MG tablet; Take 1 tablet by mouth 2 (two) times daily.  Dispense: 20 tablet; Refill: 0 - predniSONE (STERAPRED UNI-PAK 21 TAB) 10 MG (21) TBPK tablet; 6 day taper; take as directed on package instructions  Dispense: 21 tablet; Refill: 0       Mar Daring, PA-C  [facility]

## 2017-10-04 DIAGNOSIS — H903 Sensorineural hearing loss, bilateral: Secondary | ICD-10-CM | POA: Diagnosis not present

## 2017-10-04 DIAGNOSIS — H60543 Acute eczematoid otitis externa, bilateral: Secondary | ICD-10-CM | POA: Diagnosis not present

## 2017-10-14 ENCOUNTER — Other Ambulatory Visit: Payer: Self-pay | Admitting: Family Medicine

## 2017-10-19 ENCOUNTER — Telehealth: Payer: Self-pay | Admitting: Family Medicine

## 2017-10-19 DIAGNOSIS — J0140 Acute pansinusitis, unspecified: Secondary | ICD-10-CM

## 2017-10-19 MED ORDER — PREDNISONE 10 MG (21) PO TBPK: ORAL_TABLET | ORAL | 0 refills | Status: DC

## 2017-10-19 MED ORDER — AMOXICILLIN-POT CLAVULANATE 875-125 MG PO TABS: 1.0000 | ORAL_TABLET | Freq: Two times a day (BID) | ORAL | 0 refills | Status: DC

## 2017-10-19 NOTE — Telephone Encounter (Signed)
Saw Mariah [name] on 08/17/2017 for acute pansinusitis. Was prescribed Augmentin and a 6 day Prednisone taper. Please advise.

## 2017-10-19 NOTE — Telephone Encounter (Signed)
Medication sent in and pt advised.

## 2017-10-19 NOTE — Telephone Encounter (Signed)
Pt called saying she is having congestion, cough, head.  Her sister passed away and she cannot come in.  She was in last time with the same thing.  She wants to know if she can have the same medication she took then  She uses [facility] [address]  Pt 's call back is [phone]  Thanks Con Memos

## 2017-10-19 NOTE — Telephone Encounter (Signed)
It's fine to send refills of those medicaitons  Kenae Lindquist, Dionne Bucy, MD, MPH [facility] 10/19/2017 11:43 AM

## 2017-10-25 DIAGNOSIS — M810 Age-related osteoporosis without current pathological fracture: Secondary | ICD-10-CM | POA: Diagnosis not present

## 2017-10-25 DIAGNOSIS — Z96641 Presence of right artificial hip joint: Secondary | ICD-10-CM | POA: Diagnosis not present

## 2017-10-25 DIAGNOSIS — Z720 Tobacco use: Secondary | ICD-10-CM | POA: Diagnosis not present

## 2017-10-25 DIAGNOSIS — L409 Psoriasis, unspecified: Secondary | ICD-10-CM | POA: Diagnosis not present

## 2017-10-25 DIAGNOSIS — L4050 Arthropathic psoriasis, unspecified: Secondary | ICD-10-CM | POA: Diagnosis not present

## 2018-01-10 ENCOUNTER — Ambulatory Visit (INDEPENDENT_AMBULATORY_CARE_PROVIDER_SITE_OTHER): Payer: PPO | Admitting: Family Medicine

## 2018-01-10 ENCOUNTER — Encounter: Payer: Self-pay | Admitting: Family Medicine

## 2018-01-10 VITALS — BP 124/78 | HR 84 | Temp 98.2°F | Resp 16 | Wt 164.0 lb

## 2018-01-10 DIAGNOSIS — E7800 Pure hypercholesterolemia, unspecified: Secondary | ICD-10-CM | POA: Diagnosis not present

## 2018-01-10 DIAGNOSIS — G2581 Restless legs syndrome: Secondary | ICD-10-CM

## 2018-01-10 DIAGNOSIS — L409 Psoriasis, unspecified: Secondary | ICD-10-CM | POA: Diagnosis not present

## 2018-01-10 DIAGNOSIS — J309 Allergic rhinitis, unspecified: Secondary | ICD-10-CM | POA: Diagnosis not present

## 2018-01-10 DIAGNOSIS — I10 Essential (primary) hypertension: Secondary | ICD-10-CM

## 2018-01-10 MED ORDER — FLUTICASONE PROPIONATE 50 MCG/ACT NA SUSP: 2.0000 | Freq: Every day | NASAL | 6 refills | Status: DC

## 2018-01-10 MED ORDER — CLOBETASOL PROPIONATE 0.05 % EX SHAM: MEDICATED_SHAMPOO | CUTANEOUS | 1 refills | Status: DC

## 2018-01-10 MED ORDER — ROPINIROLE HCL 0.25 MG PO TABS: ORAL_TABLET | ORAL | 0 refills | Status: DC

## 2018-01-10 NOTE — Progress Notes (Signed)
Patient: Mariah [name] Female    DOB: 05/14/1946   72 y.o.   MRN: [id number] Visit Date: 01/10/2018  Today's Provider: Lavon Paganini, MD   Chief Complaint  Patient presents with  . Hypertension  . Hyperlipidemia  . Leg Problem    Trouble restless legs.    Subjective:    Hypertension  This is a chronic problem. The problem is controlled. Pertinent negatives include no anxiety, blurred vision, chest pain, headaches, malaise/fatigue, neck pain, orthopnea, palpitations, peripheral edema, PND, shortness of breath or sweats. There are no associated agents to hypertension. Risk factors for coronary artery disease include dyslipidemia and smoking/tobacco exposure.  Hyperlipidemia  This is a chronic problem. Pertinent negatives include no chest pain, focal sensory loss, focal weakness, leg pain, myalgias or shortness of breath. Current antihyperlipidemic treatment includes statins. There are no compliance problems.   Simvastatin was increased from 10 mg to 20mg  daily at last visit as LDL was not to goal. Lab Results  Component Value Date   CHOL 197 07/15/2017   HDL 53 07/15/2017   LDLCALC 112 (H) 07/15/2017   TRIG 200 (H) 07/15/2017   CHOLHDL 3.7 07/15/2017   Compalins of nocturia from HCTZ, but doesn't want to change anything at this time because BP is well controlled and she is scared of side effects from any other medications.  Psoriasis: Has had some relief with clobetasol solution on her scalp.  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  Most of her plaque psoriasis is cleared, but she continues to have plaques on her left calf, right wrist, and entire scalp.  She states her scalp is the most bothersome and itchy place.  She wonders if there is a different formulation of her clobetasol because she is worried about this running into her eyes.  Contineus to have sinus congestion.  Was treated in 07/2017 for pansiusitis.   Continues to have full feeling.  Worse in AM.  Not currently taking any antiistamines or nasal sprays.  Using proair, but doesn't seem to help.  Patient is also concerned about restless leg syndrome.  She states that she is fine during the day, but when she is laying in bed at night she cannot get comfortable and feels the need to move her legs constantly.  She denies any loss of color or warmth.  She is taking gabapentin, but does not find that this helps her restless legs.  She has never taken a medication for her restless legs.    No Known Allergies   Current Outpatient Medications:  .  albuterol (PROVENTIL HFA;VENTOLIN HFA) 108 (90 Base) MCG/ACT inhaler, Inhale 1-2 puffs into the lungs every 6 (six) hours as needed for wheezing or shortness of breath., Disp: 1 Inhaler, Rfl: 5 .  Apremilast (OTEZLA) 30 MG TABS, Take 1 tablet by mouth 2 (two) times daily., Disp: , Rfl:  .  aspirin 81 MG chewable tablet, Chew 81 mg by mouth once. TAKES THE CHEWABLE IN THE EVENING AND THE ENTERIC COATED IN THE MORNING , Disp: , Rfl:  .  betamethasone dipropionate (DIPROLENE) 0.05 % ointment, Apply topically 2 (two) times daily., Disp: 45 g, Rfl: 5 .  Cholecalciferol (VITAMIN D3) 2000 units TABS, Take 2,000 Units by mouth daily., Disp: , Rfl:  .  Cholestyramine POWD, 1 Package by Does not apply route as needed., Disp: , Rfl:  .  clobetasol (TEMOVATE) 0.05 % external solution, Apply 1 application  topically 2 (two) times daily., Disp: 50 mL, Rfl: 0 .  diphenhydrAMINE (SOMINEX) 25 MG tablet, Take 25 mg by mouth as needed for itching. , Disp: , Rfl:  .  gabapentin (NEURONTIN) 300 MG capsule, TAKE 1 CAPSULE (300 MG TOTAL) BY MOUTH 2 (TWO) TIMES DAILY., Disp: 180 capsule, Rfl: 1 .  hydrochlorothiazide (HYDRODIURIL) 12.5 MG tablet, Take 1 tablet (12.5 mg total) by mouth daily., Disp: 90 tablet, Rfl: 3 .  metoprolol succinate (TOPROL-XL) 50 MG 24 hr tablet, Take 1 tablet (50 mg total) by mouth daily. Take with or  immediately following a meal., Disp: 90 tablet, Rfl: 3 .  pantoprazole (PROTONIX) 20 MG tablet, Take 1 tablet (20 mg total) by mouth 2 (two) times daily before a meal., Disp: 180 tablet, Rfl: 3 .  polyethylene glycol powder (GLYCOLAX/MIRALAX) powder, Take 17 g by mouth 2 (two) times daily as needed., Disp: 3350 g, Rfl: 1 .  simvastatin (ZOCOR) 20 MG tablet, TAKE 1 TABLET BY MOUTH EVERYDAY AT BEDTIME, Disp: 90 tablet, Rfl: 3 .  traMADol (ULTRAM) 50 MG tablet, Take 1 tablet (50 mg total) by mouth every 6 (six) hours as needed for moderate pain or severe pain., Disp: 100 tablet, Rfl: 5 .  amoxicillin-clavulanate (AUGMENTIN) 875-125 MG tablet, Take 1 tablet by mouth 2 (two) times daily., Disp: 20 tablet, Rfl: 0 .  aspirin 81 MG EC tablet, Take 81 mg by mouth daily. TAKES THE ENTERIC COATED IN THE MORNING AND THE CHEWABLE ASPIRIN IN THE EVENING, Disp: , Rfl:  .  predniSONE (STERAPRED UNI-PAK 21 TAB) 10 MG (21) TBPK tablet, 6 day taper; take as directed on package instructions (Patient not taking: Reported on 01/10/2018), Disp: 21 tablet, Rfl: 0  Review of Systems  Constitutional: Negative.  Negative for malaise/fatigue.  HENT: Positive for congestion, postnasal drip, sinus pressure and sinus pain. Negative for ear discharge, ear pain, nosebleeds, sore throat and tinnitus.   Eyes: Negative.  Negative for blurred vision.  Respiratory: Positive for cough. Negative for apnea, choking, chest tightness, shortness of breath, wheezing and stridor.   Cardiovascular: Negative for chest pain, palpitations, orthopnea and PND.  Musculoskeletal: Negative for myalgias and neck pain.  Allergic/Immunologic: Positive for environmental allergies.  Neurological: Negative for focal weakness and headaches.  Hematological: Bruises/bleeds easily.    Social History   Tobacco Use  . Smoking status: Current Every Day Smoker    Packs/day: 1.00    Years: 0.00    Pack years: 0.00  . Smokeless tobacco: Never Used  Substance  Use Topics  . Alcohol use: Yes    Comment: occasionally glass of wine 1-2 per month.   Objective:   BP 124/78 (BP Location: Right Arm, Patient Position: Sitting, Cuff Size: Normal)   Pulse 84   Temp 98.2 F (36.8 C) (Oral)   Resp 16   Wt 164 lb (74.4 kg)   BMI 30.99 kg/m  Vitals:   01/10/18 1610  BP: 124/78  Pulse: 84  Resp: 16  Temp: 98.2 F (36.8 C)  TempSrc: Oral  Weight: 164 lb (74.4 kg)     Physical Exam  Constitutional: She is oriented to person, place, and time. She appears well-developed and well-nourished. No distress.  HENT:  Head: Normocephalic and atraumatic.  Right Ear: Tympanic membrane, external ear and ear canal normal.  Left Ear: Tympanic membrane, external ear and ear canal normal.  Nose: Mucosal edema present. Right sinus exhibits no maxillary sinus tenderness and no frontal sinus tenderness. Left sinus exhibits no maxillary  sinus tenderness and no frontal sinus tenderness.  Mouth/Throat: Uvula is midline, oropharynx is clear and moist and mucous membranes are normal.  Eyes: Pupils are equal, round, and reactive to light. Conjunctivae are normal. Right eye exhibits no discharge. Left eye exhibits no discharge. No scleral icterus.  Neck: Neck supple. No thyromegaly present.  Cardiovascular: Normal rate, regular rhythm, normal heart sounds and intact distal pulses.  No murmur heard. Pulmonary/Chest: Effort normal and breath sounds normal. No respiratory distress. She has no wheezes. She has no rales.  Musculoskeletal: She exhibits no edema.  Lymphadenopathy:    She has no cervical adenopathy.  Neurological: She is alert and oriented to person, place, and time.  Skin: Skin is warm and dry. Capillary refill takes less than 2 seconds. Rash (psoriasis plaques on R wrist, L calf, and scalp) noted.  Psychiatric: She has a normal mood and affect. Her behavior is normal.  Vitals reviewed.       Assessment & Plan:   Problem List Items Addressed This Visit        Cardiovascular and Mediastinum   Essential (primary) hypertension - Primary    Well-controlled Patient is having nocturia on HCTZ, but she does not want to try other medications at this time and wishes to discontinue this Continue current medications Check CMP      Relevant Orders   Comprehensive metabolic panel     Respiratory   Allergic rhinitis    Suspect that her sinus congestion that has become chronic in nature is related to allergic rhinitis Discussed using OTC antihistamine and avoiding Benadryl as she is over 65 Also start Flonase with 2 sprays each nostril daily        Musculoskeletal and Integument   Psoriasis    Patient is typically managed by rheumatology for her psoriasis and psoriatic arthritis on Otezla For her topical steroids, we will continue betamethasone ointment and try clobetasol shampoo instead of solution to see if this helps Patient states that her rheumatologist has discussed other options other than Otezla with her previously and she may consider the use to see if she has better skin clearance        Other   Hypercholesteremia    Currently asymptomatic on higher dose simvastatin Recheck CMP and lipid panel to see if LDL is at goal      Relevant Orders   Comprehensive metabolic panel   Lipid panel   Restless leg    We will try Requip at bedtime Start at 0.25 mg 1 to 3 hours before bedtime and titrate up to 0.5 mg At next visit, we will follow-up and see if this is improving her symptoms or if we need to titrate dose further May also consider checking iron levels and B12          Return in about 6 weeks (around 02/21/2018) for restless leg f/u.   The entirety of the information documented in the History of Present Illness, Review of Systems and Physical Exam were personally obtained by me. Portions of this information were initially documented by Ashley Royalty, CMA and reviewed by me for thoroughness and accuracy.    [name], MD, MPH [facility] 01/11/2018 9:08 AM

## 2018-01-10 NOTE — Patient Instructions (Addendum)
Start claritin or Zyrtec daily at bedtime Start flonase nasal spray at bedtime  Start Requip at bedtime (1-3 hours before) for restless legs  Come back to get labs one morning when fasting - any [facility]

## 2018-01-11 NOTE — Assessment & Plan Note (Signed)
Well-controlled Patient is having nocturia on HCTZ, but she does not want to try other medications at this time and wishes to discontinue this Continue current medications Check CMP

## 2018-01-11 NOTE — Assessment & Plan Note (Signed)
Currently asymptomatic on higher dose simvastatin Recheck CMP and lipid panel to see if LDL is at goal

## 2018-01-11 NOTE — Assessment & Plan Note (Signed)
Patient is typically managed by rheumatology for her psoriasis and psoriatic arthritis on Otezla For her topical steroids, we will continue betamethasone ointment and try clobetasol shampoo instead of solution to see if this helps Patient states that her rheumatologist has discussed other options other than Otezla with her previously and she may consider the use to see if she has better skin clearance

## 2018-01-11 NOTE — Assessment & Plan Note (Addendum)
We will try Requip at bedtime Start at 0.25 mg 1 to 3 hours before bedtime and titrate up to 0.5 mg At next visit, we will follow-up and see if this is improving her symptoms or if we need to titrate dose further May also consider checking iron levels and B12

## 2018-01-11 NOTE — Assessment & Plan Note (Signed)
Suspect that her sinus congestion that has become chronic in nature is related to allergic rhinitis Discussed using OTC antihistamine and avoiding Benadryl as she is over 65 Also start Flonase with 2 sprays each nostril daily

## 2018-01-25 ENCOUNTER — Other Ambulatory Visit: Payer: Self-pay | Admitting: Family Medicine

## 2018-01-25 DIAGNOSIS — E7800 Pure hypercholesterolemia, unspecified: Secondary | ICD-10-CM | POA: Diagnosis not present

## 2018-01-25 DIAGNOSIS — I10 Essential (primary) hypertension: Secondary | ICD-10-CM | POA: Diagnosis not present

## 2018-01-26 ENCOUNTER — Telehealth: Payer: Self-pay

## 2018-01-26 LAB — COMPREHENSIVE METABOLIC PANEL
ALBUMIN: 4.3 g/dL (ref 3.5–4.8)
ALT: 12 IU/L (ref 0–32)
AST: 20 IU/L (ref 0–40)
Albumin/Globulin Ratio: 2 (ref 1.2–2.2)
Alkaline Phosphatase: 135 IU/L — ABNORMAL HIGH (ref 39–117)
BUN / CREAT RATIO: 10 — AB (ref 12–28)
BUN: 10 mg/dL (ref 8–27)
Bilirubin Total: 0.4 mg/dL (ref 0.0–1.2)
CALCIUM: 9.6 mg/dL (ref 8.7–10.3)
CHLORIDE: 99 mmol/L (ref 96–106)
CO2: 26 mmol/L (ref 20–29)
CREATININE: 1 mg/dL (ref 0.57–1.00)
GFR calc Af Amer: 65 mL/min/{1.73_m2} (ref >59)
GFR, EST NON AFRICAN AMERICAN: 56 mL/min/{1.73_m2} — AB (ref >59)
GLOBULIN, TOTAL: 2.1 g/dL (ref 1.5–4.5)
GLUCOSE: 90 mg/dL (ref 65–99)
Potassium: 4.3 mmol/L (ref 3.5–5.2)
SODIUM: 142 mmol/L (ref 134–144)
Total Protein: 6.4 g/dL (ref 6.0–8.5)

## 2018-01-26 LAB — LIPID PANEL
CHOL/HDL RATIO: 3.4 ratio (ref 0.0–4.4)
Cholesterol, Total: 178 mg/dL (ref 100–199)
HDL: 52 mg/dL (ref >39)
LDL Calculated: 98 mg/dL (ref 0–99)
Triglycerides: 139 mg/dL (ref 0–149)
VLDL CHOLESTEROL CAL: 28 mg/dL (ref 5–40)

## 2018-01-26 NOTE — Telephone Encounter (Signed)
lmtcb

## 2018-01-26 NOTE — Telephone Encounter (Signed)
-----   Message from [name], MD sent at 01/26/2018  3:14 PM EDT ----- Normal kidney function, liver function, electrolytes. Cholesterol is improved.  [name], MD, MPH [facility] 01/26/2018 3:14 PM

## 2018-01-27 NOTE — Telephone Encounter (Signed)
Pt advised.

## 2018-02-03 ENCOUNTER — Other Ambulatory Visit: Payer: Self-pay | Admitting: Family Medicine

## 2018-02-03 DIAGNOSIS — M1990 Unspecified osteoarthritis, unspecified site: Secondary | ICD-10-CM

## 2018-02-04 ENCOUNTER — Telehealth: Payer: Self-pay

## 2018-02-04 ENCOUNTER — Other Ambulatory Visit: Payer: Self-pay | Admitting: Family Medicine

## 2018-02-04 NOTE — Telephone Encounter (Signed)
LM for pt to CB to schedule AWV after 02/25/18. -MM

## 2018-02-09 NOTE — Telephone Encounter (Signed)
Spoke with pt who states shes at work and will CB. Pt is aware she is due for an AWV and plans to schedule then apt upon calling back. -MM

## 2018-02-14 NOTE — Telephone Encounter (Signed)
Pt returned call. Pt is scheduled for 03/07/18 @ 3 pm for AWV. Thanks TNP

## 2018-02-18 ENCOUNTER — Other Ambulatory Visit: Payer: Self-pay | Admitting: Family Medicine

## 2018-02-21 NOTE — Telephone Encounter (Signed)
Can you see what dose patient is taking of her Requip now? I'll refill with that dose.  [name], MD, MPH [facility] 02/21/2018 9:45 AM

## 2018-02-21 NOTE — Telephone Encounter (Signed)
She can increase to 1.5 pills qhs to see if this helps more.  [name], MD, MPH [facility] 02/21/2018 11:46 AM

## 2018-02-21 NOTE — Telephone Encounter (Signed)
Pt states she forgot to increase the dose, and is still taking 0.25 mg qhs. She states this dose sometimes works, and sometimes does not. Please advise.

## 2018-02-22 NOTE — Telephone Encounter (Signed)
Noted, thank you.  -MM 

## 2018-02-28 ENCOUNTER — Encounter: Payer: Self-pay | Admitting: Family Medicine

## 2018-02-28 ENCOUNTER — Ambulatory Visit (INDEPENDENT_AMBULATORY_CARE_PROVIDER_SITE_OTHER): Payer: PPO | Admitting: Family Medicine

## 2018-02-28 VITALS — BP 108/64 | HR 108 | Temp 97.7°F | Resp 16 | Wt 163.0 lb

## 2018-02-28 DIAGNOSIS — G2581 Restless legs syndrome: Secondary | ICD-10-CM

## 2018-02-28 DIAGNOSIS — J309 Allergic rhinitis, unspecified: Secondary | ICD-10-CM

## 2018-02-28 MED ORDER — LEVOCETIRIZINE DIHYDROCHLORIDE 5 MG PO TABS: 5.0000 mg | ORAL_TABLET | Freq: Every evening | ORAL | 5 refills | Status: DC

## 2018-02-28 MED ORDER — ROPINIROLE HCL 0.5 MG PO TABS: 0.5000 mg | ORAL_TABLET | Freq: Every day | ORAL | 3 refills | Status: DC

## 2018-02-28 NOTE — Assessment & Plan Note (Signed)
Much improved with Requip Discussed she can move it to earlier in the evening if she is having symptoms before she is taking her dose Since she is doing well at current dose, we will continue 0.5 mg 1 to 3 hours before bedtime Discussed that there is further titration available if needed in the future

## 2018-02-28 NOTE — Assessment & Plan Note (Signed)
Uncontrolled Discussed avoiding Benadryl as she is over 64 Not having good success with Claritin Will prescribe Xyzal instead Continue Flonase Could consider addition of Singulair in the future if needed

## 2018-02-28 NOTE — Patient Instructions (Signed)

## 2018-02-28 NOTE — Progress Notes (Signed)
Patient: Mariah [name] Female    DOB: 22-Feb-1946   72 y.o.   MRN: [id number] Visit Date: 02/28/2018  Today's Provider: Lavon Paganini, MD   I, Martha Clan, CMA, am acting as scribe for Lavon Paganini, MD.  Chief Complaint  Patient presents with  . RLS   Subjective:    HPI     Follow up for RLS  The patient was last seen for this 6 weeks ago. Changes made at last visit include adding Requip at bedtime.  She reports fair compliance with treatment. She was previously taking 0.25 mg po qd without increasing diose and then realized she was supposed to be mg po qhs. Pt was then prescribed 1.5 tablets per night. She is still taking 2 tablets at night, with relief.  She feels that condition is Improved. She is not having side effects.   ------------------------------------------------------------------------------------ She is also having cough, rhinorrhea and post nasal drip.  She doesn't feel like claritin is lasting a full 24hrs.  She is also using flonase.  Denies CP, SOB, wheeze, LE edema.   No Known Allergies   Current Outpatient Medications:  .  Apremilast (OTEZLA) 30 MG TABS, Take 1 tablet by mouth 2 (two) times daily., Disp: , Rfl:  .  aspirin 81 MG chewable tablet, Chew 81 mg by mouth once. TAKES THE CHEWABLE IN THE EVENING AND THE ENTERIC COATED IN THE MORNING , Disp: , Rfl:  .  aspirin 81 MG EC tablet, Take 81 mg by mouth daily. TAKES THE ENTERIC COATED IN THE MORNING AND THE CHEWABLE ASPIRIN IN THE EVENING, Disp: , Rfl:  .  betamethasone dipropionate (DIPROLENE) 0.05 % ointment, Apply topically 2 (two) times daily., Disp: 45 g, Rfl: 5 .  Cholecalciferol (VITAMIN D3) 2000 units TABS, Take 2,000 Units by mouth daily., Disp: , Rfl:  .  Cholestyramine POWD, 1 Package by Does not apply route as needed., Disp: , Rfl:  .  clobetasol (TEMOVATE) 0.05 % external solution, Apply 1 application topically 2 (two) times daily., Disp: 50 mL, Rfl: 0 .  Clobetasol  Propionate 0.05 % shampoo, Apply topically daily to scalp.  Let sit 10-15 min and then rinse., Disp: 118 mL, Rfl: 1 .  fluticasone (FLONASE) 50 MCG/ACT nasal spray, Place 2 sprays into both nostrils daily., Disp: 16 g, Rfl: 6 .  gabapentin (NEURONTIN) 300 MG capsule, TAKE 1 CAPSULE BY MOUTH TWICE A DAY, Disp: 180 capsule, Rfl: 1 .  hydrochlorothiazide (HYDRODIURIL) 12.5 MG tablet, Take 1 tablet (12.5 mg total) by mouth daily., Disp: 90 tablet, Rfl: 3 .  metoprolol succinate (TOPROL-XL) 50 MG 24 hr tablet, Take 1 tablet (50 mg total) by mouth daily. Take with or immediately following a meal., Disp: 90 tablet, Rfl: 3 .  pantoprazole (PROTONIX) 20 MG tablet, Take 1 tablet (20 mg total) by mouth 2 (two) times daily before a meal., Disp: 180 tablet, Rfl: 3 .  polyethylene glycol powder (GLYCOLAX/MIRALAX) powder, Take 17 g by mouth 2 (two) times daily as needed., Disp: 3350 g, Rfl: 1 .  PROAIR HFA 108 (90 Base) MCG/ACT inhaler, INHALE 1-2 PUFFS INTO THE LUNGS EVERY 6 (SIX) HOURS AS NEEDED FOR WHEEZING OR SHORTNESS OF BREATH., Disp: 8.5 Inhaler, Rfl: 5 .  rOPINIRole (REQUIP) 0.25 MG tablet, Take 1.5 tablets (0.375 mg total) by mouth at bedtime., Disp: 45 tablet, Rfl: 3 .  simvastatin (ZOCOR) 20 MG tablet, TAKE 1 TABLET BY MOUTH EVERYDAY AT BEDTIME, Disp: 90 tablet, Rfl: 3 .  traMADol (ULTRAM) 50 MG tablet, TAKE 1 TABLET BY MOUTH EVERY 6 HOURS AS NEEDED FOR MODERATE OR SEVERE PAIN, Disp: 100 tablet, Rfl: 2  Review of Systems  Constitutional: Negative for activity change, appetite change, chills, diaphoresis, fatigue, fever and unexpected weight change.  HENT: Positive for congestion, postnasal drip and rhinorrhea. Negative for dental problem, drooling, ear discharge, ear pain, facial swelling, hearing loss, sore throat, trouble swallowing and voice change.   Respiratory: Positive for cough (taking loratadine qd). Negative for apnea, choking, chest tightness, shortness of breath (improved with inhaler),  wheezing and stridor.   Cardiovascular: Negative for chest pain, palpitations and leg swelling.  Genitourinary: Negative.   Neurological: Negative.   Psychiatric/Behavioral: Negative for sleep disturbance.    Social History   Tobacco Use  . Smoking status: Current Every Day Smoker    Packs/day: 1.00    Years: 0.00    Pack years: 0.00  . Smokeless tobacco: Never Used  Substance Use Topics  . Alcohol use: Yes    Comment: occasionally glass of wine 1-2 per month.   Objective:   BP 108/64 (BP Location: Left Arm, Patient Position: Sitting, Cuff Size: Normal)   Pulse (!) 108   Temp 97.7 F (36.5 C) (Oral)   Resp 16   Wt 163 lb (73.9 kg)   SpO2 95%   BMI 30.80 kg/m  Vitals:   02/28/18 1601  BP: 108/64  Pulse: (!) 108  Resp: 16  Temp: 97.7 F (36.5 C)  TempSrc: Oral  SpO2: 95%  Weight: 163 lb (73.9 kg)     Physical Exam  Constitutional: She is oriented to person, place, and time. She appears well-developed and well-nourished. No distress.  HENT:  Head: Normocephalic and atraumatic.  Mouth/Throat: Oropharynx is clear and moist.  Eyes: Conjunctivae are normal. No scleral icterus.  Neck: Neck supple. No thyromegaly present.  Cardiovascular: Normal rate, regular rhythm, normal heart sounds and intact distal pulses.  No murmur heard. Pulmonary/Chest: Effort normal and breath sounds normal. No respiratory distress. She has no wheezes.  Musculoskeletal: She exhibits no edema.  Lymphadenopathy:    She has no cervical adenopathy.  Neurological: She is alert and oriented to person, place, and time.  Skin: Skin is warm and dry. Capillary refill takes less than 2 seconds. No rash noted.  Psychiatric: She has a normal mood and affect. Her behavior is normal.  Vitals reviewed.      Assessment & Plan:   Problem List Items Addressed This Visit      Respiratory   Allergic rhinitis    Uncontrolled Discussed avoiding Benadryl as she is over 65 Not having good success with  Claritin Will prescribe Xyzal instead Continue Flonase Could consider addition of Singulair in the future if needed        Other   Restless leg - Primary    Much improved with Requip Discussed she can move it to earlier in the evening if she is having symptoms before she is taking her dose Since she is doing well at current dose, we will continue 0.5 mg 1 to 3 hours before bedtime Discussed that there is further titration available if needed in the future          Return in about 4 months (around 06/30/2018) for CPE after 07/15/18.   The entirety of the information documented in the History of Present Illness, Review of Systems and Physical Exam were personally obtained by me. Portions of this information were initially documented by Martha Clan,  CMA and reviewed by me for thoroughness and accuracy.    [name], MD, MPH [facility] 02/28/2018 4:43 PM

## 2018-03-07 ENCOUNTER — Ambulatory Visit: Payer: PPO

## 2018-03-08 ENCOUNTER — Telehealth: Payer: Self-pay

## 2018-03-08 NOTE — Telephone Encounter (Signed)
Called pt to r/s previously cancelled AWV. LMTCB. Pts CPE is not until 07/18/18. AWV needs to be scheduled any time from 05/18/18 - 07/18/18. -MM

## 2018-03-23 NOTE — Telephone Encounter (Signed)
Scheduled AWV for 05/30/18 @ 2 PM.  -MM

## 2018-04-11 ENCOUNTER — Other Ambulatory Visit: Payer: Self-pay | Admitting: Family Medicine

## 2018-04-11 MED ORDER — CHOLESTYRAMINE POWD: 1.0000 | 5 refills | Status: DC | PRN

## 2018-04-11 NOTE — Telephone Encounter (Signed)
Pt needing a refill on the medication for diarrhea.   Refill needed:  Cholestyramine POWD  Refill at:  [facility] [address]   Please advise.  Thanks  [employer]

## 2018-04-25 DIAGNOSIS — L409 Psoriasis, unspecified: Secondary | ICD-10-CM | POA: Diagnosis not present

## 2018-04-25 DIAGNOSIS — L4050 Arthropathic psoriasis, unspecified: Secondary | ICD-10-CM | POA: Diagnosis not present

## 2018-04-25 DIAGNOSIS — Z79899 Other long term (current) drug therapy: Secondary | ICD-10-CM | POA: Diagnosis not present

## 2018-05-08 ENCOUNTER — Other Ambulatory Visit: Payer: Self-pay | Admitting: Family Medicine

## 2018-05-08 DIAGNOSIS — M1990 Unspecified osteoarthritis, unspecified site: Secondary | ICD-10-CM

## 2018-05-12 DIAGNOSIS — Z96641 Presence of right artificial hip joint: Secondary | ICD-10-CM | POA: Diagnosis not present

## 2018-05-12 DIAGNOSIS — M1611 Unilateral primary osteoarthritis, right hip: Secondary | ICD-10-CM | POA: Diagnosis not present

## 2018-05-22 ENCOUNTER — Other Ambulatory Visit: Payer: Self-pay | Admitting: Family Medicine

## 2018-05-25 ENCOUNTER — Telehealth: Payer: Self-pay | Admitting: Family Medicine

## 2018-05-25 NOTE — Telephone Encounter (Signed)
Pt has only 1 day left of  rOPINIRole (REQUIP) 0.5 MG tablet.  Pt went to pharmacy to pick up her Rx but the refill was for rOPINIRole (REQUIP) 0.25 MG tablet.  Pt did not pick up the RX because she wants to make sure Dr. B what amount she needs to be taking.  Please advise pt.  Thanks, [employer]

## 2018-05-25 NOTE — Telephone Encounter (Signed)
It did get increased to 0.5mg  qhs. Can send refill of that  Mariah Crews, MD, MPH [facility] 05/25/2018 5:03 PM

## 2018-05-26 MED ORDER — ROPINIROLE HCL 0.5 MG PO TABS: 0.5000 mg | ORAL_TABLET | Freq: Every day | ORAL | 1 refills | Status: DC

## 2018-05-26 NOTE — Telephone Encounter (Signed)
Requip 0.5 mg sent to [facility]. Left patient a voicemail advising her that medication has been sent to [facility] pharmacy.

## 2018-05-30 ENCOUNTER — Ambulatory Visit (INDEPENDENT_AMBULATORY_CARE_PROVIDER_SITE_OTHER): Payer: PPO

## 2018-05-30 VITALS — BP 108/62 | HR 100 | Temp 98.0°F | Ht 61.0 in | Wt 153.6 lb

## 2018-05-30 DIAGNOSIS — Z0000 Encounter for general adult medical examination without abnormal findings: Secondary | ICD-10-CM

## 2018-05-30 DIAGNOSIS — Z13820 Encounter for screening for osteoporosis: Secondary | ICD-10-CM

## 2018-05-30 NOTE — Patient Instructions (Addendum)
Mariah [name] , Thank you for taking time to come for your Medicare Wellness Visit. I appreciate your ongoing commitment to your health goals. Please review the following plan we discussed and let me know if I can assist you in the future.   Screening recommendations/referrals: Colonoscopy: Up to date, due 07/2021 Mammogram: Up to date, due 11/2018 Bone Density: Referral sent today.  Recommended yearly ophthalmology/optometry visit for glaucoma screening and checkup Recommended yearly dental visit for hygiene and checkup  Vaccinations: Influenza vaccine: Pt declines today.  Pneumococcal vaccine: Completed series Tdap vaccine: Up to date, due 03/2021 Shingles vaccine: Pt declines today.     Advanced directives: Advance directive discussed with you today. Even though you declined this today please call our office should you change your mind and we can give you the proper paperwork for you to fill out.  Conditions/risks identified: Smoking cessation.   Next appointment: 07/18/18 @ 2:00 PM with Dr Brita Romp.   Preventive Care 73 Years and Older, Female Preventive care refers to lifestyle choices and visits with your health care provider that can promote health and wellness. What does preventive care include?  A yearly physical exam. This is also called an annual well check.  Dental exams once or twice a year.  Routine eye exams. Ask your health care provider how often you should have your eyes checked.  Personal lifestyle choices, including:  Daily care of your teeth and gums.  Regular physical activity.  Eating a healthy diet.  Avoiding tobacco and drug use.  Limiting alcohol use.  Practicing safe sex.  Taking low-dose aspirin every day.  Taking vitamin and mineral supplements as recommended by your health care provider. What happens during an annual well check? The services and screenings done by your health care provider during your annual well check will depend on  your age, overall health, lifestyle risk factors, and family history of disease. Counseling  Your health care provider may ask you questions about your:  Alcohol use.  Tobacco use.  Drug use.  Emotional well-being.  Home and relationship well-being.  Sexual activity.  Eating habits.  History of falls.  Memory and ability to understand (cognition).  Work and work Statistician.  Reproductive health. Screening  You may have the following tests or measurements:  Height, weight, and BMI.  Blood pressure.  Lipid and cholesterol levels. These may be checked every 5 years, or more frequently if you are over 38 years old.  Skin check.  Lung cancer screening. You may have this screening every year starting at age 44 if you have a 30-pack-year history of smoking and currently smoke or have quit within the past 15 years.  Fecal occult blood test (FOBT) of the stool. You may have this test every year starting at age 39.  Flexible sigmoidoscopy or colonoscopy. You may have a sigmoidoscopy every 5 years or a colonoscopy every 10 years starting at age 41.  Hepatitis C blood test.  Hepatitis B blood test.  Sexually transmitted disease (STD) testing.  Diabetes screening. This is done by checking your blood sugar (glucose) after you have not eaten for a while (fasting). You may have this done every 1-3 years.  Bone density scan. This is done to screen for osteoporosis. You may have this done starting at age 77.  Mammogram. This may be done every 1-2 years. Talk to your health care provider about how often you should have regular mammograms. Talk with your health care provider about your test results, treatment options,  and if necessary, the need for more tests. Vaccines  Your health care provider may recommend certain vaccines, such as:  Influenza vaccine. This is recommended every year.  Tetanus, diphtheria, and acellular pertussis (Tdap, Td) vaccine. You may need a Td booster  every 10 years.  Zoster vaccine. You may need this after age 40.  Pneumococcal 13-valent conjugate (PCV13) vaccine. One dose is recommended after age 64.  Pneumococcal polysaccharide (PPSV23) vaccine. One dose is recommended after age 24. Talk to your health care provider about which screenings and vaccines you need and how often you need them. This information is not intended to replace advice given to you by your health care provider. Make sure you discuss any questions you have with your health care provider. Document Released: 08/02/2015 Document Revised: 03/25/2016 Document Reviewed: 05/07/2015 Elsevier Interactive Patient Education  2017 [place] Prevention in the Home Falls can cause injuries. They can happen to people of all ages. There are many things you can do to make your home safe and to help prevent falls. What can I do on the outside of my home?  Regularly fix the edges of walkways and driveways and fix any cracks.  Remove anything that might make you trip as you walk through a door, such as a raised step or threshold.  Trim any bushes or trees on the path to your home.  Use bright outdoor lighting.  Clear any walking paths of anything that might make someone trip, such as rocks or tools.  Regularly check to see if handrails are loose or broken. Make sure that both sides of any steps have handrails.  Any raised decks and porches should have guardrails on the edges.  Have any leaves, snow, or ice cleared regularly.  Use sand or salt on walking paths during winter.  Clean up any spills in your garage right away. This includes oil or grease spills. What can I do in the bathroom?  Use night lights.  Install grab bars by the toilet and in the tub and shower. Do not use towel bars as grab bars.  Use non-skid mats or decals in the tub or shower.  If you need to sit down in the shower, use a plastic, non-slip stool.  Keep the floor dry. Clean up any  water that spills on the floor as soon as it happens.  Remove soap buildup in the tub or shower regularly.  Attach bath mats securely with double-sided non-slip rug tape.  Do not have throw rugs and other things on the floor that can make you trip. What can I do in the bedroom?  Use night lights.  Make sure that you have a light by your bed that is easy to reach.  Do not use any sheets or blankets that are too big for your bed. They should not hang down onto the floor.  Have a firm chair that has side arms. You can use this for support while you get dressed.  Do not have throw rugs and other things on the floor that can make you trip. What can I do in the kitchen?  Clean up any spills right away.  Avoid walking on wet floors.  Keep items that you use a lot in easy-to-reach places.  If you need to reach something above you, use a strong step stool that has a grab bar.  Keep electrical cords out of the way.  Do not use floor polish or wax that makes floors slippery. If  you must use wax, use non-skid floor wax.  Do not have throw rugs and other things on the floor that can make you trip. What can I do with my stairs?  Do not leave any items on the stairs.  Make sure that there are handrails on both sides of the stairs and use them. Fix handrails that are broken or loose. Make sure that handrails are as long as the stairways.  Check any carpeting to make sure that it is firmly attached to the stairs. Fix any carpet that is loose or worn.  Avoid having throw rugs at the top or bottom of the stairs. If you do have throw rugs, attach them to the floor with carpet tape.  Make sure that you have a light switch at the top of the stairs and the bottom of the stairs. If you do not have them, ask someone to add them for you. What else can I do to help prevent falls?  Wear shoes that:  Do not have high heels.  Have rubber bottoms.  Are comfortable and fit you well.  Are closed  at the toe. Do not wear sandals.  If you use a stepladder:  Make sure that it is fully opened. Do not climb a closed stepladder.  Make sure that both sides of the stepladder are locked into place.  Ask someone to hold it for you, if possible.  Clearly mark and make sure that you can see:  Any grab bars or handrails.  First and last steps.  Where the edge of each step is.  Use tools that help you move around (mobility aids) if they are needed. These include:  Canes.  Walkers.  Scooters.  Crutches.  Turn on the lights when you go into a dark area. Replace any light bulbs as soon as they burn out.  Set up your furniture so you have a clear path. Avoid moving your furniture around.  If any of your floors are uneven, fix them.  If there are any pets around you, be aware of where they are.  Review your medicines with your doctor. Some medicines can make you feel dizzy. This can increase your chance of falling. Ask your doctor what other things that you can do to help prevent falls. This information is not intended to replace advice given to you by your health care provider. Make sure you discuss any questions you have with your health care provider. Document Released: 05/02/2009 Document Revised: 12/12/2015 Document Reviewed: 08/10/2014 Elsevier Interactive Patient Education  2017 [employer].

## 2018-05-30 NOTE — Progress Notes (Signed)
Subjective:   Mariah [name] is a 72 y.o. female who presents for Medicare Annual (Subsequent) preventive examination.  Review of Systems:  N/A  Cardiac Risk Factors include: advanced age (>85men, >46 women);dyslipidemia;hypertension;obesity (BMI >30kg/m2);smoking/ tobacco exposure     Objective:     Vitals: BP 108/62 (BP Location: Right Arm)   Pulse 100   Temp 98 F (36.7 C) (Oral)   Ht 5\' 1"  (1.549 m)   Wt 153 lb 9.6 oz (69.7 kg)   BMI 29.02 kg/m   Body mass index is 29.02 kg/m.  Advanced Directives 05/30/2018 02/25/2017 02/25/2017 11/24/2016 11/16/2016 08/10/2016 05/05/2016  Does Patient Have a Medical Advance Directive? No No No No No No No  Type of Advance Directive - - - - - - -  Would patient like information on creating a medical advance directive? No - Patient declined No - Patient declined - No - Patient declined No - Patient declined No - Patient declined -    Tobacco Social History   Tobacco Use  Smoking Status Current Every Day Smoker  . Packs/day: 1.00  . Years: 0.00  . Pack years: 0.00  Smokeless Tobacco Never Used     Ready to quit: No Counseling given: No   Clinical Intake:  Pre-visit preparation completed: Yes  Pain : No/denies pain Pain Score: 0-No pain     Nutritional Status: BMI 25 -29 Overweight Nutritional Risks: Nausea/ vomitting/ diarrhea(diarrhea occasionally with abx use.) Diabetes: No  How often do you need to have someone help you when you read instructions, pamphlets, or other written materials from your doctor or pharmacy?: 1 - Never  Interpreter Needed?: No  Information entered by :: [facility], LPN  Past Medical History:  Diagnosis Date  . Abdominal aortic aneurysm (AAA) ([place])   . Arthritis   . COPD (chronic obstructive pulmonary disease) ([place])   . GERD (gastroesophageal reflux disease)   . Headache    sinus  . History of adenomatous polyp of colon   . Hyperlipidemia   . Hypertension   . Psoriasis   . Right lumbar  radiculitis   . Vitamin D deficiency    Past Surgical History:  Procedure Laterality Date  . APPENDECTOMY  2000  . BREAST BIOPSY Right 09/2016   radial scar  . BREAST LUMPECTOMY Right 11/24/2016   excision for radial scar  . BREAST LUMPECTOMY WITH NEEDLE LOCALIZATION Right 11/24/2016   Procedure: BREAST LUMPECTOMY WITH NEEDLE LOCALIZATION;  Surgeon: Leonie Green, MD;  Location: [facility] ORS;  Service: General;  Laterality: Right;  . COLONOSCOPY WITH PROPOFOL N/A 08/10/2016   Procedure: COLONOSCOPY WITH PROPOFOL;  Surgeon: Manya Silvas, MD;  Location[facility] ENDOSCOPY;  Service: Endoscopy;  Laterality: N/A;  . ESOPHAGOGASTRODUODENOSCOPY (EGD) WITH PROPOFOL N/A 08/10/2016   Procedure: ESOPHAGOGASTRODUODENOSCOPY (EGD) WITH PROPOFOL;  Surgeon: Manya Silvas, MD;  Location: [facility] ENDOSCOPY;  Service: Endoscopy;  Laterality: N/A;  . FOOT SURGERY Bilateral   . JOINT REPLACEMENT Right 01/27/2016   hip  . 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 TOTAL HIP ARTHROPLASTY Right 01/27/2016   Procedure: TOTAL HIP ARTHROPLASTY;  Surgeon: Dereck Leep, MD;  Location: [facility] ORS;  Service: Orthopedics;  Laterality: Right;   Family History  Problem Relation Age of Onset  . Cancer Mother        kidney  . Arthritis Father   . Heart disease Father   . COPD Sister   . Fibromyalgia Sister   . Osteoarthritis Sister   . Osteoarthritis  Maternal Aunt   . Colon cancer Paternal Aunt   . Colon cancer Paternal Uncle   . Colon cancer Paternal Grandmother   . Breast cancer Neg Hx    Social History   Socioeconomic History  . Marital status: Divorced    Spouse name: Not on file  . Number of children: 0  . Years of education: 66  . Highest education level: Associate degree: occupational, Hotel manager, or vocational program  Occupational History    Employer: [employer]    Comment: part time  Social Needs  . Financial resource strain: Somewhat hard  . Food insecurity:    Worry: Sometimes true     Inability: Sometimes true  . Transportation needs:    Medical: No    Non-medical: No  Tobacco Use  . Smoking status: Current Every Day Smoker    Packs/day: 1.00    Years: 0.00    Pack years: 0.00  . Smokeless tobacco: Never Used  Substance and Sexual Activity  . Alcohol use: Yes    Comment: occasionally glass of wine 1-2 per month.  . Drug use: No  . Sexual activity: Not on file  Lifestyle  . Physical activity:    Days per week: 0 days    Minutes per session: 0 min  . Stress: Not at all  Relationships  . Social connections:    Talks on phone: Patient refused    Gets together: Patient refused    Attends religious service: Patient refused    Active member of club or organization: Patient refused    Attends meetings of clubs or organizations: Patient refused    Relationship status: Patient refused  Other Topics Concern  . Not on file  Social History Narrative  . Not on file    Outpatient Encounter Medications as of 05/30/2018  Medication Sig  . Apremilast (OTEZLA) 30 MG TABS Take 1 tablet by mouth 2 (two) times daily.  [name] aspirin 81 MG chewable tablet Chew 81 mg by mouth 2 (two) times daily. TAKES THE CHEWABLE IN THE EVENING AND THE ENTERIC COATED IN THE MORNING   . betamethasone dipropionate (DIPROLENE) 0.05 % ointment Apply topically 2 (two) times daily.  . Cholecalciferol (VITAMIN D3) 2000 units TABS Take 2,000 Units by mouth daily.  . Cholestyramine POWD Take 1 Package by mouth as needed.  . clobetasol (TEMOVATE) 0.05 % external solution Apply 1 application topically 2 (two) times daily.  . Clobetasol Propionate 0.05 % shampoo Apply topically daily to scalp.  Let sit 10-15 min and then rinse.  . diphenhydrAMINE (BENADRYL) 25 MG tablet Take 25 mg by mouth at bedtime as needed.  . gabapentin (NEURONTIN) 300 MG capsule TAKE 1 CAPSULE BY MOUTH TWICE A DAY  . hydrochlorothiazide (HYDRODIURIL) 12.5 MG tablet Take 1 tablet (12.5 mg total) by mouth daily.  [name] levocetirizine  (XYZAL) 5 MG tablet Take 1 tablet (5 mg total) by mouth every evening.  . metoprolol succinate (TOPROL-XL) 50 MG 24 hr tablet Take 1 tablet (50 mg total) by mouth daily. Take with or immediately following a meal.  . pantoprazole (PROTONIX) 20 MG tablet Take 1 tablet (20 mg total) by mouth 2 (two) times daily before a meal. (Patient taking differently: Take 20 mg by mouth 2 (two) times daily. )  . polyethylene glycol powder (GLYCOLAX/MIRALAX) powder Take 17 g by mouth 2 (two) times daily as needed.  [name] PROAIR HFA 108 (90 Base) MCG/ACT inhaler INHALE 1-2 PUFFS INTO THE LUNGS EVERY 6 (SIX) HOURS AS  NEEDED FOR WHEEZING OR SHORTNESS OF BREATH.  [name] rOPINIRole (REQUIP) 0.5 MG tablet Take 1 tablet (0.5 mg total) by mouth at bedtime.  . simvastatin (ZOCOR) 20 MG tablet TAKE 1 TABLET BY MOUTH EVERYDAY AT BEDTIME  . traMADol (ULTRAM) 50 MG tablet TAKE 1 TABLET BY MOUTH EVERY 6 HOURS AS NEEDED FOR MODERATE OR SEVERE PAIN  . aspirin 81 MG EC tablet Take 81 mg by mouth daily. TAKES THE ENTERIC COATED IN THE MORNING AND THE CHEWABLE ASPIRIN IN THE EVENING  . fluticasone (FLONASE) 50 MCG/ACT nasal spray Place 2 sprays into both nostrils daily. (Patient not taking: Reported on 05/30/2018)  . rOPINIRole (REQUIP) 0.5 MG tablet Take 1 tablet (0.5 mg total) by mouth at bedtime. Dose increase (Patient not taking: Reported on 05/30/2018)   No facility-administered encounter medications on file as of 05/30/2018.     Activities of Daily Living In your present state of health, do you have any difficulty performing the following activities: 05/30/2018  Hearing? Y  Comment Does not wear hearing aids.   Vision? Y  Difficulty concentrating or making decisions? N  Walking or climbing stairs? Y  Comment Due to pains in back and hip.   Dressing or bathing? N  Doing errands, shopping? N  Preparing Food and eating ? N  Using the Toilet? N  In the past six months, have you accidently leaked urine? N  Do you have problems with  loss of bowel control? N  Managing your Medications? N  Managing your Finances? N  Housekeeping or managing your Housekeeping? N  Some recent data might be hidden    Patient Care Team: Brita Romp Dionne Bucy, MD as PCP - General (Family Medicine) Marry Guan, Laurice Record, MD as Consulting Physician (Orthopedic Surgery) Algernon Huxley, MD as Referring Physician (Vascular Surgery) Marlowe Sax, MD as Referring Physician (Internal Medicine)    Assessment:   This is a routine wellness examination for Mariah [name].  Exercise Activities and Dietary recommendations Current Exercise Habits: The patient does not participate in regular exercise at present, Exercise limited by: orthopedic condition(s)  Goals    . Quit smoking / using tobacco     Recommend to quit smoking. Start by tapering down until completely quit.        Fall Risk Fall Risk  05/30/2018 02/25/2017 02/25/2017 06/24/2015  Falls in the past year? 0 No No No   FALL RISK PREVENTION PERTAINING TO THE HOME:  Any stairs in or around the home WITH handrails? No  Home free of loose throw rugs in walkways, pet beds, electrical cords, etc? Yes  Adequate lighting in your home to reduce risk of falls? Yes   ASSISTIVE DEVICES UTILIZED TO PREVENT FALLS:  Life alert? No  Use of a cane, walker or w/c? No  Grab bars in the bathroom? Yes  Shower chair or bench in shower? No  Elevated toilet seat or a handicapped toilet? No    TIMED UP AND GO:  Was the test performed? No .     Depression Screen PHQ 2/9 Scores 05/30/2018 02/25/2017 02/25/2017 06/24/2015  PHQ - 2 Score 0 0 0 0  PHQ- 9 Score - 4 - -     Cognitive Function: Declined today.         Immunization History  Administered Date(s) Administered  . Influenza, High Dose Seasonal PF 06/24/2015, 03/16/2016, 07/15/2017  . Pneumococcal Conjugate-13 06/24/2015  . Pneumococcal Polysaccharide-23 03/26/2011  . Tdap 03/26/2011    Qualifies for Shingles Vaccine? Yes . Due  for Shingrix.  Education has been provided regarding the importance of this vaccine. Pt has been advised to call insurance company to determine out of pocket expense. Advised may also receive vaccine at local pharmacy or Health Dept. Verbalized acceptance and understanding.  Tdap: Up to date  Flu Vaccine: Due for Flu vaccine. Does the patient want to receive this vaccine today?  No . Education has been provided regarding the importance of this vaccine but still declined. Advised may receive this vaccine at local pharmacy or Health Dept. Aware to provide a copy of the vaccination record if obtained from local pharmacy or Health Dept. Verbalized acceptance and understanding.  Pneumococcal Vaccine: Up to date   Screening Tests Health Maintenance  Topic Date Due  . DEXA SCAN  07/07/2017  . INFLUENZA VACCINE  02/17/2018  . MAMMOGRAM  09/14/2018  . TETANUS/TDAP  03/25/2021  . COLONOSCOPY  08/10/2021  . Hepatitis C Screening  Completed  . PNA vac Low Risk Adult  Completed    Cancer Screenings:  Colorectal Screening: Completed 08/10/16. Repeat every 5 years.  Mammogram: Completed 11/24/16.   Bone Density: Completed 07/08/15. Results reflect OSTEOPOROSIS. Repeat every 2 years. Ordered today. Pt aware the office will call re: appt.  Lung Cancer Screening: (Low Dose CT Chest recommended if Age 20-80 years, 30 pack-year currently smoking OR have quit w/in 15years.) does qualify, however declines referral today.   Additional Screening:  Hepatitis C Screening: Up to date  Vision Screening: Recommended annual ophthalmology exams for early detection of glaucoma and other disorders of the eye.  Dental Screening: Recommended annual dental exams for proper oral hygiene  Community Resource Referral:  CRR required this visit?  No       Plan:  I have personally reviewed and addressed the Medicare Annual Wellness questionnaire and have noted the following in the patient's chart:  A. Medical and social  history B. Use of alcohol, tobacco or illicit drugs  C. Current medications and supplements D. Functional ability and status E.  Nutritional status F.  Physical activity G. Advance directives H. List of other physicians I.  Hospitalizations, surgeries, and ER visits in previous 12 months J.  [place] such as hearing and vision if needed, cognitive and depression L. Referrals and appointments - none  In addition, I have reviewed and discussed with patient certain preventive protocols, quality metrics, and best practice recommendations. A written personalized care plan for preventive services as well as general preventive health recommendations were provided to patient.  See attached scanned questionnaire for additional information.   Signed,  Fabio Neighbors, LPN Nurse Health Advisor  Nurse Recommendations: Pt declined the influenza vaccine today due to dental work this afternoon. DEXA referral sent today. Pt also declined the chest CT scan today.

## 2018-06-08 DIAGNOSIS — L4050 Arthropathic psoriasis, unspecified: Secondary | ICD-10-CM | POA: Diagnosis not present

## 2018-07-01 ENCOUNTER — Other Ambulatory Visit: Payer: Self-pay | Admitting: Family Medicine

## 2018-07-09 ENCOUNTER — Other Ambulatory Visit: Payer: Self-pay | Admitting: Family Medicine

## 2018-07-11 NOTE — Telephone Encounter (Signed)
This is a Dr.B patient requesting a refill on metoprolol 50 mg.

## 2018-07-18 ENCOUNTER — Encounter: Payer: Self-pay | Admitting: Family Medicine

## 2018-07-18 ENCOUNTER — Ambulatory Visit (INDEPENDENT_AMBULATORY_CARE_PROVIDER_SITE_OTHER): Payer: PPO | Admitting: Family Medicine

## 2018-07-18 VITALS — BP 105/69 | HR 78 | Temp 97.6°F | Ht 61.5 in | Wt 154.8 lb

## 2018-07-18 DIAGNOSIS — I714 Abdominal aortic aneurysm, without rupture, unspecified: Secondary | ICD-10-CM

## 2018-07-18 DIAGNOSIS — L4050 Arthropathic psoriasis, unspecified: Secondary | ICD-10-CM | POA: Diagnosis not present

## 2018-07-18 DIAGNOSIS — E7800 Pure hypercholesterolemia, unspecified: Secondary | ICD-10-CM | POA: Diagnosis not present

## 2018-07-18 DIAGNOSIS — Z0000 Encounter for general adult medical examination without abnormal findings: Secondary | ICD-10-CM | POA: Diagnosis not present

## 2018-07-18 DIAGNOSIS — G2581 Restless legs syndrome: Secondary | ICD-10-CM

## 2018-07-18 DIAGNOSIS — Z79899 Other long term (current) drug therapy: Secondary | ICD-10-CM

## 2018-07-18 DIAGNOSIS — J449 Chronic obstructive pulmonary disease, unspecified: Secondary | ICD-10-CM | POA: Diagnosis not present

## 2018-07-18 DIAGNOSIS — D692 Other nonthrombocytopenic purpura: Secondary | ICD-10-CM

## 2018-07-18 DIAGNOSIS — I739 Peripheral vascular disease, unspecified: Secondary | ICD-10-CM

## 2018-07-18 DIAGNOSIS — R739 Hyperglycemia, unspecified: Secondary | ICD-10-CM

## 2018-07-18 DIAGNOSIS — F17200 Nicotine dependence, unspecified, uncomplicated: Secondary | ICD-10-CM

## 2018-07-18 DIAGNOSIS — Z1239 Encounter for other screening for malignant neoplasm of breast: Secondary | ICD-10-CM | POA: Diagnosis not present

## 2018-07-18 DIAGNOSIS — I10 Essential (primary) hypertension: Secondary | ICD-10-CM

## 2018-07-18 MED ORDER — ROPINIROLE HCL 1 MG PO TABS: 1.0000 mg | ORAL_TABLET | Freq: Every day | ORAL | 3 refills | Status: DC

## 2018-07-18 NOTE — Assessment & Plan Note (Addendum)
Few noted today, worse in the past Continue to monitor

## 2018-07-18 NOTE — Patient Instructions (Signed)
Preventive Care 73 Years and Older, Female Preventive care refers to lifestyle choices and visits with your health care provider that can promote health and wellness. What does preventive care include?  A yearly physical exam. This is also called an annual well check.  Dental exams once or twice a year.  Routine eye exams. Ask your health care provider how often you should have your eyes checked.  Personal lifestyle choices, including: ? Daily care of your teeth and gums. ? Regular physical activity. ? Eating a healthy diet. ? Avoiding tobacco and drug use. ? Limiting alcohol use. ? Practicing safe sex. ? Taking low-dose aspirin every day. ? Taking vitamin and mineral supplements as recommended by your health care provider. What happens during an annual well check? The services and screenings done by your health care provider during your annual well check will depend on your age, overall health, lifestyle risk factors, and family history of disease. Counseling Your health care provider may ask you questions about your:  Alcohol use.  Tobacco use.  Drug use.  Emotional well-being.  Home and relationship well-being.  Sexual activity.  Eating habits.  History of falls.  Memory and ability to understand (cognition).  Work and work Statistician.  Reproductive health.  Screening You may have the following tests or measurements:  Height, weight, and BMI.  Blood pressure.  Lipid and cholesterol levels. These may be checked every 5 years, or more frequently if you are over 30 years old.  Skin check.  Lung cancer screening. You may have this screening every year starting at age 27 if you have a 30-pack-year history of smoking and currently smoke or have quit within the past 15 years.  Colorectal cancer screening. All adults should have this screening starting at age 33 and continuing until age 46. You will have tests every 1-10 years, depending on your results and the  type of screening test. People at increased risk should start screening at an earlier age. Screening tests may include: ? Guaiac-based fecal occult blood testing. ? Fecal immunochemical test (FIT). ? Stool DNA test. ? Virtual colonoscopy. ? Sigmoidoscopy. During this test, a flexible tube with a tiny camera (sigmoidoscope) is used to examine your rectum and lower colon. The sigmoidoscope is inserted through your anus into your rectum and lower colon. ? Colonoscopy. During this test, a long, thin, flexible tube with a tiny camera (colonoscope) is used to examine your entire colon and rectum.  Hepatitis C blood test.  Hepatitis B blood test.  Sexually transmitted disease (STD) testing.  Diabetes screening. This is done by checking your blood sugar (glucose) after you have not eaten for a while (fasting). You may have this done every 1-3 years.  Bone density scan. This is done to screen for osteoporosis. You may have this done starting at age 37.  Mammogram. This may be done every 1-2 years. Talk to your health care provider about how often you should have regular mammograms. Talk with your health care provider about your test results, treatment options, and if necessary, the need for more tests. Vaccines Your health care provider may recommend certain vaccines, such as:  Influenza vaccine. This is recommended every year.  Tetanus, diphtheria, and acellular pertussis (Tdap, Td) vaccine. You may need a Td booster every 10 years.  Varicella vaccine. You may need this if you have not been vaccinated.  Zoster vaccine. You may need this after age 38.  Measles, mumps, and rubella (MMR) vaccine. You may need at least  one dose of MMR if you were born in 1957 or later. You may also need a second dose.  Pneumococcal 13-valent conjugate (PCV13) vaccine. One dose is recommended after age 24.  Pneumococcal polysaccharide (PPSV23) vaccine. One dose is recommended after age 24.  Meningococcal  vaccine. You may need this if you have certain conditions.  Hepatitis A vaccine. You may need this if you have certain conditions or if you travel or work in places where you may be exposed to hepatitis A.  Hepatitis B vaccine. You may need this if you have certain conditions or if you travel or work in places where you may be exposed to hepatitis B.  Haemophilus influenzae type b (Hib) vaccine. You may need this if you have certain conditions. Talk to your health care provider about which screenings and vaccines you need and how often you need them. This information is not intended to replace advice given to you by your health care provider. Make sure you discuss any questions you have with your health care provider. Document Released: 08/02/2015 Document Revised: 08/26/2017 Document Reviewed: 05/07/2015 Elsevier Interactive Patient Education  2019 [employer].

## 2018-07-18 NOTE — Assessment & Plan Note (Signed)
Discussed importance of cessation Patient has many other risk factors including PAD, HTN, AAA, HLD Patient is not interested in quitting at this time We will continue to assess 3 to 5-minute discussion was had

## 2018-07-18 NOTE — Assessment & Plan Note (Signed)
Currently asymptomatic on higher dose simvastatin LDL was to goal of less than 100 on last check Recheck CMP and lipid panel

## 2018-07-18 NOTE — Assessment & Plan Note (Signed)
Chronic, stable, well controlled Continue current medications Return precautions discussed

## 2018-07-18 NOTE — Assessment & Plan Note (Signed)
No current limb threatening symptoms Previously followed by vascular surgery Continue to monitor

## 2018-07-18 NOTE — Assessment & Plan Note (Signed)
Chronic Improved with Requip, but not to goal Discussed again that she can move this to earlier in the evening depending on when her symptoms start Increase to 1 mg nightly-may take this 1 to 3 hours before bedtime

## 2018-07-18 NOTE — Progress Notes (Signed)
Patient: Mariah [name], Female    DOB: June 29, 1946, 72 y.o.   MRN: [id number] Visit Date: 07/18/2018  Today's Provider: Lavon Paganini, MD   Chief Complaint  Patient presents with  . Annual Exam   Subjective:  I, Tiburcio Pea, CMA, am acting as a scribe for Lavon Paganini, MD.   Patient had a AWE on 05/30/2018 with [facility]   Complete Physical Mariah [name] is a 72 y.o. female. She feels fairly well. She reports exercising lightly. She reports she is sleeping fair.  Patient was incidentally found to have 2.5 cm AAA in 11/2015.  She has not had follow-up ultrasounds since this time.  She was previously seen by Dr. dew with vascular surgery, but has not followed up with them since  Patient reports her breathing is stable and well-controlled.  She has not had recent COPD exacerbation.  She knows that if she were to quit smoking this would improve, but she is not interested in quitting at this time  Patient is seeing rheumatology for her psoriatic arthritis.  She was recently switched from [place] to Humira.  She is having worsening in her skin and joint symptoms after this switch.  She plans to follow-up with her rheumatologist  Restless leg syndrome: Patient is taking 0.5 to 0.75mg  Requip a few hours before bedtime.  She denies any side effects.  She feels like this helped somewhat, but not entirely.  She denies any claudication symptoms.  -----------------------------------------------------------   Review of Systems  Constitutional: Negative.   HENT: Positive for dental problem, postnasal drip, rhinorrhea and sinus pain.   Eyes: Negative.   Respiratory: Negative.   Cardiovascular: Negative.   Gastrointestinal: Positive for constipation and diarrhea.  Endocrine: Negative.   Genitourinary: Negative.   Musculoskeletal: Positive for myalgias.  Skin: Positive for rash.  Allergic/Immunologic: Negative.   Neurological: Negative.   Hematological: Negative.     Psychiatric/Behavioral: Negative.     Social History   Socioeconomic History  . Marital status: Divorced    Spouse name: Not on file  . Number of children: 0  . Years of education: 41  . Highest education level: Associate degree: occupational, Hotel manager, or vocational program  Occupational History    Employer: [employer]    Comment: part time  Social Needs  . Financial resource strain: Somewhat hard  . Food insecurity:    Worry: Sometimes true    Inability: Sometimes true  . Transportation needs:    Medical: No    Non-medical: No  Tobacco Use  . Smoking status: Current Every Day Smoker    Packs/day: 1.00    Years: 0.00    Pack years: 0.00  . Smokeless tobacco: Never Used  Substance and Sexual Activity  . Alcohol use: Yes    Comment: occasionally glass of wine 1-2 per month.  . Drug use: No  . Sexual activity: Not on file  Lifestyle  . Physical activity:    Days per week: 0 days    Minutes per session: 0 min  . Stress: Not at all  Relationships  . Social connections:    Talks on phone: Patient refused    Gets together: Patient refused    Attends religious service: Patient refused    Active member of club or organization: Patient refused    Attends meetings of clubs or organizations: Patient refused    Relationship status: Patient refused  . Intimate partner violence:    Fear of current or ex partner: Patient  refused    Emotionally abused: Patient refused    Physically abused: Patient refused    Forced sexual activity: Patient refused  Other Topics Concern  . Not on file  Social History Narrative  . Not on file    Past Medical History:  Diagnosis Date  . Abdominal aortic aneurysm (AAA) ([place])   . Arthritis   . COPD (chronic obstructive pulmonary disease) ([place])   . GERD (gastroesophageal reflux disease)   . Headache    sinus  . History of adenomatous polyp of colon   . Hyperlipidemia   . Hypertension   . Psoriasis   . Right lumbar radiculitis    . Vitamin D deficiency      Patient Active Problem List   Diagnosis Date Noted  . Allergic rhinitis 01/10/2018  . Cholelithiasis 03/16/2016  . Peripheral arterial disease ([place]) 02/05/2016  . 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 Abdominal aortic aneurysm (AAA) ([place]) 01/13/2016  . 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 Nerve root inflammation 10/25/2015  . Psoriatic arthritis ([place]) 10/07/2015  . 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 Arthralgia of hip 08/12/2015  . Arthralgia of multiple joints 08/12/2015  . Age related osteoporosis 08/12/2015  . Chronic obstructive pulmonary disease ([place]) 06/24/2015  . Current tobacco use 06/24/2015  . Senile purpura ([place]) 06/24/2015  . Abnormal serum level of alkaline phosphatase 06/18/2015  . Elevated hemoglobin ([place]) 06/18/2015  . Fatigue 06/18/2015  . Fistula 06/18/2015  . Blood glucose elevated 06/18/2015  . Gastro-esophageal reflux disease without esophagitis 06/18/2015  . Restless leg 06/18/2015  . Leg pain, right 06/18/2015  . 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 Avitaminosis D 06/18/2015  . Mechanical and motor problems with internal organs 10/11/2009  . Hypercholesteremia 10/11/2009  . Arthritis, degenerative 09/25/2009  . Essential (primary) hypertension 09/25/2009  . Psoriasis 09/25/2009  . Compulsive tobacco user syndrome 09/25/2009    Past Surgical History:  Procedure Laterality Date  . APPENDECTOMY  2000  . BREAST BIOPSY Right 09/2016   radial scar  . BREAST LUMPECTOMY Right 11/24/2016   excision for radial scar  . BREAST LUMPECTOMY WITH NEEDLE LOCALIZATION Right 11/24/2016   Procedure: BREAST LUMPECTOMY WITH NEEDLE LOCALIZATION;  Surgeon: Leonie Green, MD;  Location: [facility] ORS;  Service: General;  Laterality: Right;  . COLONOSCOPY WITH PROPOFOL N/A 08/10/2016   Procedure: COLONOSCOPY WITH PROPOFOL;  Surgeon: Manya Silvas, MD;  Location: [facility] ENDOSCOPY;  Service: Endoscopy;  Laterality: N/A;  .  ESOPHAGOGASTRODUODENOSCOPY (EGD) WITH PROPOFOL N/A 08/10/2016   Procedure: ESOPHAGOGASTRODUODENOSCOPY (EGD) WITH PROPOFOL;  Surgeon: Manya Silvas, MD;  Location: [facility] ENDOSCOPY;  Service: Endoscopy;  Laterality: N/A;  . FOOT SURGERY Bilateral   . JOINT REPLACEMENT Right 01/27/2016   hip  . 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 TOTAL HIP ARTHROPLASTY Right 01/27/2016   Procedure: TOTAL HIP ARTHROPLASTY;  Surgeon: Dereck Leep, MD;  Location: [facility] ORS;  Service: Orthopedics;  Laterality: Right;    Her family history includes Arthritis in her father; COPD in her sister; Cancer in her mother; Colon cancer in her paternal aunt, paternal grandmother, and paternal uncle; Fibromyalgia in her sister; Heart disease in her father; Osteoarthritis in her maternal aunt and sister. There is no history of Breast cancer.      Current Outpatient Medications:  .  Adalimumab (HUMIRA [place]), Inject into the skin., Disp: , Rfl:  .  aspirin 81 MG EC tablet, Take 81 mg by mouth daily. TAKES THE ENTERIC COATED IN THE  MORNING AND THE CHEWABLE ASPIRIN IN THE EVENING, Disp: , Rfl:  .  betamethasone dipropionate (DIPROLENE) 0.05 % ointment, Apply topically 2 (two) times daily., Disp: 45 g, Rfl: 5 .  Cholecalciferol (VITAMIN D3) 2000 units TABS, Take 2,000 Units by mouth daily., Disp: , Rfl:  .  Cholestyramine POWD, Take 1 Package by mouth as needed., Disp: 60 g, Rfl: 5 .  clobetasol (TEMOVATE) 0.05 % external solution, Apply 1 application topically 2 (two) times daily., Disp: 50 mL, Rfl: 0 .  Clobetasol Propionate 0.05 % shampoo, Apply topically daily to scalp.  Let sit 10-15 min and then rinse., Disp: 118 mL, Rfl: 1 .  diphenhydrAMINE (BENADRYL) 25 MG tablet, Take 25 mg by mouth at bedtime as needed., Disp: , Rfl:  .  gabapentin (NEURONTIN) 300 MG capsule, TAKE 1 CAPSULE BY MOUTH TWICE A DAY, Disp: 180 capsule, Rfl: 1 .  hydrochlorothiazide (HYDRODIURIL) 12.5 MG tablet, TAKE 1 TABLET BY MOUTH EVERY DAY, Disp: 90 tablet, Rfl:  3 .  levocetirizine (XYZAL) 5 MG tablet, Take 1 tablet (5 mg total) by mouth every evening., Disp: 30 tablet, Rfl: 5 .  metoprolol succinate (TOPROL-XL) 50 MG 24 hr tablet, TAKE 1 TABLET (50 MG TOTAL) BY MOUTH DAILY. TAKE WITH OR IMMEDIATELY FOLLOWING A MEAL., Disp: 90 tablet, Rfl: 3 .  pantoprazole (PROTONIX) 20 MG tablet, TAKE 1 TABLET (20 MG TOTAL) BY MOUTH 2 (TWO) TIMES DAILY BEFORE A MEAL., Disp: 180 tablet, Rfl: 3 .  polyethylene glycol powder (GLYCOLAX/MIRALAX) powder, Take 17 g by mouth 2 (two) times daily as needed., Disp: 3350 g, Rfl: 1 .  PROAIR HFA 108 (90 Base) MCG/ACT inhaler, INHALE 1-2 PUFFS INTO THE LUNGS EVERY 6 (SIX) HOURS AS NEEDED FOR WHEEZING OR SHORTNESS OF BREATH., Disp: 8.5 Inhaler, Rfl: 5 .  simvastatin (ZOCOR) 20 MG tablet, TAKE 1 TABLET BY MOUTH EVERYDAY AT BEDTIME, Disp: 90 tablet, Rfl: 3 .  traMADol (ULTRAM) 50 MG tablet, TAKE 1 TABLET BY MOUTH EVERY 6 HOURS AS NEEDED FOR MODERATE OR SEVERE PAIN, Disp: 100 tablet, Rfl: 2 .  fluticasone (FLONASE) 50 MCG/ACT nasal spray, Place 2 sprays into both nostrils daily. (Patient not taking: Reported on 05/30/2018), Disp: 16 g, Rfl: 6 .  rOPINIRole (REQUIP) 1 MG tablet, Take 1 tablet (1 mg total) by mouth at bedtime., Disp: 90 tablet, Rfl: 3  Patient Care Team: , Dionne Bucy, MD as PCP - General (Family Medicine) Hooten, Laurice Record, MD as Consulting Physician (Orthopedic Surgery) Algernon Huxley, MD as Referring Physician (Vascular Surgery) Marlowe Sax, MD as Referring Physician (Internal Medicine)     Objective:   Vitals: BP 105/69 (BP Location: Right Arm, Patient Position: Sitting, Cuff Size: Normal)   Pulse 78   Temp 97.6 F (36.4 C) (Oral)   Ht 5' 1.5" (1.562 m)   Wt 154 lb 12.8 oz (70.2 kg)   SpO2 94%   BMI 28.78 kg/m   Physical Exam Vitals signs reviewed.  Constitutional:      General: She is not in acute distress.    Appearance: Normal appearance. She is well-developed. She is not diaphoretic.    HENT:     Head: Normocephalic and atraumatic.     Right Ear: External ear normal.     Left Ear: External ear normal.     Nose: Nose normal.     Mouth/Throat:     Mouth: Mucous membranes are moist.     Pharynx: Oropharynx is clear. No oropharyngeal exudate.  Eyes:  General: No scleral icterus.    Conjunctiva/sclera: Conjunctivae normal.     Pupils: Pupils are equal, round, and reactive to light.  Neck:     Musculoskeletal: Neck supple.     Thyroid: No thyromegaly.  Cardiovascular:     Rate and Rhythm: Normal rate and regular rhythm.     Heart sounds: Normal heart sounds. No murmur.  Pulmonary:     Effort: Pulmonary effort is normal. No respiratory distress.     Breath sounds: Normal breath sounds. No wheezing or rales.  Abdominal:     General: Bowel sounds are normal. There is no distension.     Palpations: Abdomen is soft.     Tenderness: There is no abdominal tenderness. There is no guarding or rebound.  Musculoskeletal:        General: No deformity.     Right lower leg: No edema.     Left lower leg: No edema.  Lymphadenopathy:     Cervical: No cervical adenopathy.  Skin:    General: Skin is warm and dry.     Capillary Refill: Capillary refill takes less than 2 seconds.     Findings: Rash (psoriasis over her extremities and scalp) present.  Neurological:     General: No focal deficit present.     Mental Status: She is alert and oriented to person, place, and time.  Psychiatric:        Mood and Affect: Mood normal.        Behavior: Behavior normal.        Thought Content: Thought content normal.     Activities of Daily Living In your present state of health, do you have any difficulty performing the following activities: 07/18/2018 05/30/2018  Hearing? Y Y  Comment - Does not wear hearing aids.   Vision? N Y  Difficulty concentrating or making decisions? Y N  Walking or climbing stairs? Y Y  Comment - Due to pains in back and hip.   Dressing or bathing? N N   Doing errands, shopping? N N  Preparing Food and eating ? - N  Using the Toilet? - N  In the past six months, have you accidently leaked urine? - N  Do you have problems with loss of bowel control? - N  Managing your Medications? - N  Managing your Finances? - N  Housekeeping or managing your Housekeeping? - N  Some recent data might be hidden    Fall Risk Assessment Fall Risk  07/18/2018 05/30/2018 02/25/2017 02/25/2017 06/24/2015  Falls in the past year? 0 0 No No No     Depression Screen PHQ 2/9 Scores 07/18/2018 05/30/2018 02/25/2017 02/25/2017  PHQ - 2 Score 1 0 0 0  PHQ- 9 Score 4 - 4 -     Assessment & Plan:    Annual Physical Reviewed patient's Family Medical History Reviewed and updated list of patient's medical providers Assessment of cognitive impairment was done Assessed patient's functional ability Established a written schedule for health screening [place] Completed and Reviewed  Exercise Activities and Dietary recommendations Goals    . Quit smoking / using tobacco     Recommend to quit smoking. Start by tapering down until completely quit.        Immunization History  Administered Date(s) Administered  . Influenza, High Dose Seasonal PF 06/24/2015, 03/16/2016, 07/15/2017  . Influenza-Unspecified 06/01/2018  . Pneumococcal Conjugate-13 06/24/2015  . Pneumococcal Polysaccharide-23 03/26/2011  . Tdap 03/26/2011    Health Maintenance  Topic  Date Due  . DEXA SCAN  07/07/2017  . MAMMOGRAM  09/14/2018  . TETANUS/TDAP  03/25/2021  . COLONOSCOPY  08/10/2021  . INFLUENZA VACCINE  Completed  . Hepatitis C Screening  Completed  . PNA vac Low Risk Adult  Completed     Discussed health benefits of physical activity, and encouraged her to engage in regular exercise appropriate for her age and condition.    ------------------------------------------------------------------------------------------------------------  Problem List Items  Addressed This Visit      Cardiovascular and Mediastinum   Essential (primary) hypertension    Well-controlled Continue current medications Check CMP Follow-up in 6 months      Relevant Orders   Comprehensive metabolic panel   Senile purpura ([place])    Few noted today, worse in the past Continue to monitor      Abdominal aortic aneurysm (AAA) ([place])    Patient was found to have 2.  5 to 3 cm saccular AAA on ultrasound and CT scan in 11/2015 She does not have any current symptoms This was meant to be monitored annually, but she has not had any follow-up ultrasounds Ultrasound ordered today to follow-up on size Stressed importance of smoking cessation and blood pressure control      Relevant Orders   VAS [place] AAA DUPLEX   Peripheral arterial disease ([place])    No current limb threatening symptoms Previously followed by vascular surgery Continue to monitor        Respiratory   Chronic obstructive pulmonary disease (HCC)    Chronic, stable, well controlled Continue current medications Return precautions discussed        Musculoskeletal and Integument   Psoriatic arthritis ([place])    Followed by rheumatology Having worsening of skin and joint symptoms since switching her biologic Advised her to follow-up with rheumatology to discuss plans going forward      Relevant Medications   Adalimumab (HUMIRA [place])     Other   Hypercholesteremia    Currently asymptomatic on higher dose simvastatin LDL was to goal of less than 100 on last check Recheck CMP and lipid panel      Relevant Orders   Lipid panel   Comprehensive metabolic panel   Blood glucose elevated    Check hemoglobin A1c      Relevant Orders   Hemoglobin A1c   Restless leg    Chronic Improved with Requip, but not to goal Discussed again that she can move this to earlier in the evening depending on when her symptoms start Increase to 1 mg nightly-may take this 1 to 3 hours before bedtime      Relevant  Orders   B12   Compulsive tobacco user syndrome    Discussed importance of cessation Patient has many other risk factors including PAD, HTN, AAA, HLD Patient is not interested in quitting at this time We will continue to assess 3 to 5-minute discussion was had       Other Visit Diagnoses    Encounter for annual physical exam    -  Primary   Relevant Orders   Lipid panel   Comprehensive metabolic panel   Screening for breast cancer       Relevant Orders   MM 3D SCREEN BREAST BILATERAL   Long-term use of high-risk medication       Relevant Orders   B12   CBC       Return in about 6 months (around 01/17/2019) for chronic disease f/u.   The entirety of the information  documented in the History of Present Illness, Review of Systems and Physical Exam were personally obtained by me. Portions of this information were initially documented by Tiburcio Pea, CMA and reviewed by me for thoroughness and accuracy.    [name], MD, MPH [facility] 07/18/2018 3:12 PM

## 2018-07-18 NOTE — Assessment & Plan Note (Signed)
Well-controlled Continue current medications Check CMP Follow-up in 6 months

## 2018-07-18 NOTE — Assessment & Plan Note (Signed)
Followed by rheumatology Having worsening of skin and joint symptoms since switching her biologic Advised her to follow-up with rheumatology to discuss plans going forward

## 2018-07-18 NOTE — Assessment & Plan Note (Signed)
Patient was found to have 2.  5 to 3 cm saccular AAA on ultrasound and CT scan in 11/2015 She does not have any current symptoms This was meant to be monitored annually, but she has not had any follow-up ultrasounds Ultrasound ordered today to follow-up on size Stressed importance of smoking cessation and blood pressure control

## 2018-07-18 NOTE — Assessment & Plan Note (Signed)
Check hemoglobin A1c.

## 2018-07-25 DIAGNOSIS — M2550 Pain in unspecified joint: Secondary | ICD-10-CM | POA: Diagnosis not present

## 2018-07-25 DIAGNOSIS — G8929 Other chronic pain: Secondary | ICD-10-CM | POA: Diagnosis not present

## 2018-07-25 DIAGNOSIS — M19012 Primary osteoarthritis, left shoulder: Secondary | ICD-10-CM | POA: Diagnosis not present

## 2018-07-25 DIAGNOSIS — L409 Psoriasis, unspecified: Secondary | ICD-10-CM | POA: Diagnosis not present

## 2018-07-25 DIAGNOSIS — L4050 Arthropathic psoriasis, unspecified: Secondary | ICD-10-CM | POA: Diagnosis not present

## 2018-07-25 DIAGNOSIS — M25512 Pain in left shoulder: Secondary | ICD-10-CM | POA: Diagnosis not present

## 2018-07-25 DIAGNOSIS — I700 Atherosclerosis of aorta: Secondary | ICD-10-CM | POA: Diagnosis not present

## 2018-07-25 DIAGNOSIS — Z720 Tobacco use: Secondary | ICD-10-CM | POA: Diagnosis not present

## 2018-07-25 LAB — BASIC METABOLIC PANEL
BUN: 11 (ref 4–21)
Creatinine: 1.1 (ref 0.5–1.1)
Glucose: 89
POTASSIUM: 4.2 (ref 3.4–5.3)
Sodium: 137 (ref 137–147)

## 2018-07-25 LAB — CBC AND DIFFERENTIAL
HCT: 47 — AB (ref 36–46)
Hemoglobin: 16.1 — AB (ref 12.0–16.0)
Platelets: 215 (ref 150–399)
WBC: 7.2

## 2018-07-25 LAB — HEPATIC FUNCTION PANEL
ALT: 12 (ref 7–35)
AST: 21 (ref 13–35)

## 2018-07-26 DIAGNOSIS — Z0000 Encounter for general adult medical examination without abnormal findings: Secondary | ICD-10-CM | POA: Diagnosis not present

## 2018-07-26 DIAGNOSIS — R739 Hyperglycemia, unspecified: Secondary | ICD-10-CM | POA: Diagnosis not present

## 2018-07-26 DIAGNOSIS — E7800 Pure hypercholesterolemia, unspecified: Secondary | ICD-10-CM | POA: Diagnosis not present

## 2018-07-26 DIAGNOSIS — G2581 Restless legs syndrome: Secondary | ICD-10-CM | POA: Diagnosis not present

## 2018-07-26 DIAGNOSIS — I10 Essential (primary) hypertension: Secondary | ICD-10-CM | POA: Diagnosis not present

## 2018-07-26 DIAGNOSIS — Z79899 Other long term (current) drug therapy: Secondary | ICD-10-CM | POA: Diagnosis not present

## 2018-07-26 LAB — CALCIUM
Calcium: 9.7
Carbon Dioxide, Total: 34.4
Chloride: 97

## 2018-07-27 ENCOUNTER — Telehealth: Payer: Self-pay

## 2018-07-27 LAB — VITAMIN B12: Vitamin B-12: 332 pg/mL (ref 232–1245)

## 2018-07-27 LAB — HEMOGLOBIN A1C
Est. average glucose Bld gHb Est-mCnc: 108 mg/dL
Hgb A1c MFr Bld: 5.4 % (ref 4.8–5.6)

## 2018-07-27 LAB — LIPID PANEL
CHOLESTEROL TOTAL: 169 mg/dL (ref 100–199)
Chol/HDL Ratio: 3 ratio (ref 0.0–4.4)
HDL: 57 mg/dL (ref >39)
LDL Calculated: 91 mg/dL (ref 0–99)
TRIGLYCERIDES: 103 mg/dL (ref 0–149)
VLDL Cholesterol Cal: 21 mg/dL (ref 5–40)

## 2018-07-27 NOTE — Telephone Encounter (Signed)
Patient advised.KW 

## 2018-07-27 NOTE — Telephone Encounter (Signed)
-----   Message from [name], MD sent at 07/27/2018  8:18 AM EST ----- Normal labs

## 2018-08-07 ENCOUNTER — Other Ambulatory Visit: Payer: Self-pay | Admitting: Family Medicine

## 2018-08-07 DIAGNOSIS — M1990 Unspecified osteoarthritis, unspecified site: Secondary | ICD-10-CM

## 2018-08-08 ENCOUNTER — Telehealth: Payer: Self-pay

## 2018-08-08 ENCOUNTER — Ambulatory Visit
Admission: RE | Admit: 2018-08-08 | Discharge: 2018-08-08 | Disposition: A | Discharge status: Home / Self Care | Payer: PPO | Source: Ambulatory Visit | Attending: Family Medicine | Admitting: Family Medicine

## 2018-08-08 ENCOUNTER — Other Ambulatory Visit: Payer: Self-pay | Admitting: Family Medicine

## 2018-08-08 DIAGNOSIS — M85832 Other specified disorders of bone density and structure, left forearm: Secondary | ICD-10-CM | POA: Diagnosis not present

## 2018-08-08 DIAGNOSIS — Z13820 Encounter for screening for osteoporosis: Secondary | ICD-10-CM | POA: Diagnosis not present

## 2018-08-08 DIAGNOSIS — Z780 Asymptomatic menopausal state: Secondary | ICD-10-CM | POA: Diagnosis not present

## 2018-08-08 DIAGNOSIS — M810 Age-related osteoporosis without current pathological fracture: Secondary | ICD-10-CM | POA: Diagnosis not present

## 2018-08-08 NOTE — Telephone Encounter (Signed)
Patient was advised. Patient states she does not remember taking Fosamax. Patient states she will call back and let you know if she wants to started taking the medication after she research and read over the medication.

## 2018-08-08 NOTE — Telephone Encounter (Signed)
-----   Message from [name], MD sent at 08/08/2018  1:56 PM EST ----- Bone density has worsened since last checked in 2016.  Now with osteoporosis.  Looks like Fosamax was prescribed in the past, but not sure if patient took this or not.  I would recommend therapy as she is at increased risk of fractures.

## 2018-08-12 ENCOUNTER — Other Ambulatory Visit (INDEPENDENT_AMBULATORY_CARE_PROVIDER_SITE_OTHER): Payer: PPO

## 2018-08-15 ENCOUNTER — Ambulatory Visit
Admission: RE | Admit: 2018-08-15 | Discharge: 2018-08-15 | Disposition: A | Discharge status: Home / Self Care | Payer: PPO | Source: Ambulatory Visit | Attending: Family Medicine | Admitting: Family Medicine

## 2018-08-15 DIAGNOSIS — Z1231 Encounter for screening mammogram for malignant neoplasm of breast: Secondary | ICD-10-CM | POA: Diagnosis not present

## 2018-08-15 DIAGNOSIS — Z1239 Encounter for other screening for malignant neoplasm of breast: Secondary | ICD-10-CM

## 2018-08-22 ENCOUNTER — Other Ambulatory Visit: Payer: Self-pay | Admitting: Family Medicine

## 2018-08-23 ENCOUNTER — Other Ambulatory Visit: Payer: Self-pay | Admitting: Family Medicine

## 2018-08-31 ENCOUNTER — Ambulatory Visit (INDEPENDENT_AMBULATORY_CARE_PROVIDER_SITE_OTHER): Payer: PPO

## 2018-08-31 DIAGNOSIS — I714 Abdominal aortic aneurysm, without rupture, unspecified: Secondary | ICD-10-CM

## 2018-09-26 DIAGNOSIS — L409 Psoriasis, unspecified: Secondary | ICD-10-CM | POA: Diagnosis not present

## 2018-09-26 DIAGNOSIS — Z79899 Other long term (current) drug therapy: Secondary | ICD-10-CM | POA: Diagnosis not present

## 2018-09-26 DIAGNOSIS — Z720 Tobacco use: Secondary | ICD-10-CM | POA: Diagnosis not present

## 2018-09-26 DIAGNOSIS — L4050 Arthropathic psoriasis, unspecified: Secondary | ICD-10-CM | POA: Diagnosis not present

## 2018-10-11 ENCOUNTER — Other Ambulatory Visit: Payer: Self-pay

## 2018-10-11 MED ORDER — SIMVASTATIN 20 MG PO TABS: 20.0000 mg | ORAL_TABLET | Freq: Every day | ORAL | 3 refills | Status: DC

## 2018-10-26 DIAGNOSIS — L4050 Arthropathic psoriasis, unspecified: Secondary | ICD-10-CM | POA: Diagnosis not present

## 2018-11-13 ENCOUNTER — Other Ambulatory Visit: Payer: Self-pay | Admitting: Family Medicine

## 2018-11-13 DIAGNOSIS — M1990 Unspecified osteoarthritis, unspecified site: Secondary | ICD-10-CM

## 2019-01-16 ENCOUNTER — Ambulatory Visit (INDEPENDENT_AMBULATORY_CARE_PROVIDER_SITE_OTHER): Payer: PPO | Admitting: Family Medicine

## 2019-01-16 ENCOUNTER — Encounter: Payer: Self-pay | Admitting: Family Medicine

## 2019-01-16 DIAGNOSIS — J449 Chronic obstructive pulmonary disease, unspecified: Secondary | ICD-10-CM

## 2019-01-16 DIAGNOSIS — L409 Psoriasis, unspecified: Secondary | ICD-10-CM

## 2019-01-16 DIAGNOSIS — I714 Abdominal aortic aneurysm, without rupture, unspecified: Secondary | ICD-10-CM

## 2019-01-16 DIAGNOSIS — E7800 Pure hypercholesterolemia, unspecified: Secondary | ICD-10-CM | POA: Diagnosis not present

## 2019-01-16 DIAGNOSIS — I10 Essential (primary) hypertension: Secondary | ICD-10-CM

## 2019-01-16 DIAGNOSIS — L4050 Arthropathic psoriasis, unspecified: Secondary | ICD-10-CM | POA: Diagnosis not present

## 2019-01-16 DIAGNOSIS — G2581 Restless legs syndrome: Secondary | ICD-10-CM

## 2019-01-16 MED ORDER — CLOBETASOL PROPIONATE 0.05 % EX SOLN: 1.0000 "application " | Freq: Two times a day (BID) | CUTANEOUS | 0 refills | Status: DC

## 2019-01-16 MED ORDER — CLOBETASOL PROPIONATE 0.05 % EX SHAM: MEDICATED_SHAMPOO | CUTANEOUS | 1 refills | Status: DC

## 2019-01-16 MED ORDER — BETAMETHASONE DIPROPIONATE 0.05 % EX OINT: TOPICAL_OINTMENT | Freq: Two times a day (BID) | CUTANEOUS | 5 refills | Status: DC

## 2019-01-16 NOTE — Assessment & Plan Note (Signed)
Stable/slightly smaller in size on last [place] Continue to monitor it annually/every other year with [place]

## 2019-01-16 NOTE — Assessment & Plan Note (Signed)
Followed by Rheum On [facility] Discussed possible immunosuppression with biologics and importance of avoidance of high risk situations in current pandemic

## 2019-01-16 NOTE — Assessment & Plan Note (Signed)
Chronic, stable, well controlled Continue current meds Return precautions discussed

## 2019-01-16 NOTE — Progress Notes (Signed)
Patient: Mariah [name] Female    DOB: 11-09-1945   73 y.o.   MRN: [id number] Visit Date: 01/16/2019  Today's Provider: Lavon Paganini, MD   Chief Complaint  Patient presents with  . Hypertension  . Hyperlipidemia  . Restless leg   Subjective:    Virtual Visit via Telephone Note  I connected with Mariah [name] on 01/16/19 at  1:20 PM EDT by telephone and verified that I am speaking with the correct person using two identifiers.   Patient location: home Provider location: [place] involved in the visit: patient, provider   I discussed the limitations, risks, security and privacy concerns of performing an evaluation and management service by telephone and the availability of in person appointments. I also discussed with the patient that there may be a patient responsible charge related to this service. The patient expressed understanding and agreed to proceed.  HPI  Hypertension, follow-up:  BP Readings from Last 3 Encounters:  07/18/18 105/69  05/30/18 108/62  02/28/18 108/64    She was last seen for hypertension 6 months ago.  BP at that visit was 105/69. Management changes since that visit include no change. She reports good compliance with treatment. She is not having side effects.  She is not exercising. She is adherent to low salt diet.   Outside blood pressures are not being checked. She is experiencing none.  Patient denies chest pain, chest pressure/discomfort, claudication, dyspnea, exertional chest pressure/discomfort, fatigue, irregular heart beat, lower extremity edema, near-syncope, orthopnea, palpitations, paroxysmal nocturnal dyspnea, syncope and tachypnea.   Cardiovascular risk factors include advanced age (older than 85 for men, 84 for women), dyslipidemia, hypertension and sedentary lifestyle.  Use of agents associated with hypertension: none   Weight trend: stable Wt Readings from Last 3 Encounters:  07/18/18 154 lb  12.8 oz (70.2 kg)  05/30/18 153 lb 9.6 oz (69.7 kg)  02/28/18 163 lb (73.9 kg)    Current diet: low salt  ------------------------------------------------------------------------  Lipid/Cholesterol, Follow-up:   Last seen for this6 months ago.  Management changes since that visit include no changes. Last Lipid Panel:    Component Value Date/Time   CHOL 169 07/26/2018 1054   TRIG 103 07/26/2018 1054   HDL 57 07/26/2018 1054   CHOLHDL 3.0 07/26/2018 1054   CHOLHDL 3.7 07/15/2017 1535   LDLCALC 91 07/26/2018 1054   LDLCALC 112 (H) 07/15/2017 1535    Risk factors for vascular disease include hypercholesterolemia, hypertension and smoking  She reports good compliance with treatment. She is not having side effects.  Current symptoms include none Weight trend: stable Prior visit with dietician: no Current diet: low salt Current exercise: none  Wt Readings from Last 3 Encounters:  07/18/18 154 lb 12.8 oz (70.2 kg)  05/30/18 153 lb 9.6 oz (69.7 kg)  02/28/18 163 lb (73.9 kg)   -------------------------------------------------------------------  Restless Leg:  Requip increased to 1mg  qhs at last visit ~6 months ago. Symptoms are well controlled  Followed by Rheum for PSA and psoriasis - Humira was not helping.  Switched to Cosentyx.  Still building the dose.  Allergies  Allergen Reactions  . Clindamycin/Lincomycin     diarrhea  . Amoxicillin Diarrhea     Current Outpatient Medications:  .  Adalimumab (HUMIRA [place]), Inject into the skin., Disp: , Rfl:  .  albuterol (PROVENTIL HFA;VENTOLIN HFA) 108 (90 Base) MCG/ACT inhaler, INHALE 1-2 PUFFS INTO THE LUNGS EVERY 6 HOURS AS NEEDED FOR WHEEZING OR SHORTNESS OF  BREATH., Disp: 8.5 Inhaler, Rfl: 5 .  aspirin 81 MG EC tablet, Take 81 mg by mouth daily. TAKES THE ENTERIC COATED IN THE MORNING AND THE CHEWABLE ASPIRIN IN THE EVENING, Disp: , Rfl:  .  betamethasone dipropionate (DIPROLENE) 0.05 % ointment, Apply topically 2 (two)  times daily., Disp: 45 g, Rfl: 5 .  Cholecalciferol (VITAMIN D3) 2000 units TABS, Take 2,000 Units by mouth daily., Disp: , Rfl:  .  Cholestyramine POWD, Take 1 Package by mouth as needed., Disp: 60 g, Rfl: 5 .  clobetasol (TEMOVATE) 0.05 % external solution, Apply 1 application topically 2 (two) times daily., Disp: 50 mL, Rfl: 0 .  Clobetasol Propionate 0.05 % shampoo, Apply topically daily to scalp.  Let sit 10-15 min and then rinse., Disp: 118 mL, Rfl: 1 .  diphenhydrAMINE (BENADRYL) 25 MG tablet, Take 25 mg by mouth at bedtime as needed., Disp: , Rfl:  .  fluticasone (FLONASE) 50 MCG/ACT nasal spray, Place 2 sprays into both nostrils daily. (Patient not taking: Reported on 05/30/2018), Disp: 16 g, Rfl: 6 .  gabapentin (NEURONTIN) 300 MG capsule, TAKE 1 CAPSULE BY MOUTH TWICE A DAY, Disp: 180 capsule, Rfl: 1 .  hydrochlorothiazide (HYDRODIURIL) 12.5 MG tablet, TAKE 1 TABLET BY MOUTH EVERY DAY, Disp: 90 tablet, Rfl: 3 .  levocetirizine (XYZAL) 5 MG tablet, TAKE 1 TABLET BY MOUTH EVERY DAY IN THE EVENING, Disp: 90 tablet, Rfl: 4 .  metoprolol succinate (TOPROL-XL) 50 MG 24 hr tablet, TAKE 1 TABLET (50 MG TOTAL) BY MOUTH DAILY. TAKE WITH OR IMMEDIATELY FOLLOWING A MEAL., Disp: 90 tablet, Rfl: 3 .  pantoprazole (PROTONIX) 20 MG tablet, TAKE 1 TABLET (20 MG TOTAL) BY MOUTH 2 (TWO) TIMES DAILY BEFORE A MEAL., Disp: 180 tablet, Rfl: 3 .  polyethylene glycol powder (GLYCOLAX/MIRALAX) powder, Take 17 g by mouth 2 (two) times daily as needed., Disp: 3350 g, Rfl: 1 .  rOPINIRole (REQUIP) 1 MG tablet, Take 1 tablet (1 mg total) by mouth at bedtime., Disp: 90 tablet, Rfl: 3 .  simvastatin (ZOCOR) 20 MG tablet, Take 1 tablet (20 mg total) by mouth daily., Disp: 90 tablet, Rfl: 3 .  traMADol (ULTRAM) 50 MG tablet, TAKE 1 TABLET BY MOUTH EVERY 6 HOURS AS NEEDED FOR MODERATE OR SEVERE PAIN, Disp: 100 tablet, Rfl: 2  Review of Systems  Constitutional: Negative.   HENT: Negative.   Musculoskeletal: Positive for  arthralgias.  Skin: Positive for rash.  Psychiatric/Behavioral: Negative.     Social History   Tobacco Use  . Smoking status: Current Every Day Smoker    Packs/day: 1.00    Years: 0.00    Pack years: 0.00  . Smokeless tobacco: Never Used  Substance Use Topics  . Alcohol use: Yes    Comment: occasionally glass of wine 1-2 per month.     Objective:   There were no vitals taken for this visit. There were no vitals filed for this visit.   Physical Exam   No results found for any visits on 01/16/19.     Assessment & Plan    I discussed the assessment and treatment plan with the patient. The patient was provided an opportunity to ask questions and all were answered. The patient agreed with the plan and demonstrated an understanding of the instructions.   The patient was advised to call back or seek an in-person evaluation if the symptoms worsen or if the condition fails to improve as anticipated.  Problem List Items Addressed This Visit  Cardiovascular and Mediastinum   Essential (primary) hypertension - Primary    Well controlled at last visit Unable to check home BP No changes to medications Repeat metabolic panel at next in person visit      Abdominal aortic aneurysm (AAA) ([place])    Stable/slightly smaller in size on last [place] Continue to monitor it annually/every other year with [place]        Respiratory   Chronic obstructive pulmonary disease (HCC)    Chronic, stable, well controlled Continue current meds Return precautions discussed        Musculoskeletal and Integument   Psoriasis    Managed by Rheum On Cosentyx now Refilled topical steroids      Relevant Medications   betamethasone dipropionate (DIPROLENE) 0.05 % ointment   Psoriatic arthritis ([place])    Followed by Rheum On Cosentyx Discussed possible immunosuppression with biologics and importance of avoidance of high risk situations in current pandemic        Other   Hypercholesteremia     Doing well on Simvastatin - will continue Recheck CMP and lipid panel at CPE      Restless leg    Chronic Well controlled Continue Requip 1mg  qhs - may take 1-3 hours before bedtime          Return in about 6 months (around 07/18/2019) for CPE/AWV.   The entirety of the information documented in the History of Present Illness, Review of Systems and Physical Exam were personally obtained by me. Portions of this information were initially documented by Tiburcio Pea, CMA and reviewed by me for thoroughness and accuracy.    Bacigalupo, Dionne Bucy, MD MPH [facility]

## 2019-01-16 NOTE — Assessment & Plan Note (Signed)
Well controlled at last visit Unable to check home BP No changes to medications Repeat metabolic panel at next in person visit

## 2019-01-16 NOTE — Assessment & Plan Note (Signed)
Doing well on Simvastatin - will continue Recheck CMP and lipid panel at CPE

## 2019-01-16 NOTE — Assessment & Plan Note (Signed)
Managed by Rheum On Cosentyx now Refilled topical steroids

## 2019-01-16 NOTE — Assessment & Plan Note (Signed)
Chronic Well controlled Continue Requip 1mg  qhs - may take 1-3 hours before bedtime

## 2019-01-26 ENCOUNTER — Telehealth: Payer: Self-pay

## 2019-01-26 MED ORDER — AMOXICILLIN-POT CLAVULANATE 875-125 MG PO TABS: 1.0000 | ORAL_TABLET | Freq: Two times a day (BID) | ORAL | 0 refills | Status: AC

## 2019-01-26 NOTE — Telephone Encounter (Signed)
Patient was advised. KW 

## 2019-01-26 NOTE — Telephone Encounter (Signed)
Rx for augmentin sent to pharmacy. Take BID.  Try to get appt with dentist when they reopen.  Call us if things get worse.  Make have some stomach upset with this antibiotic, but it is best for the tooth.

## 2019-01-26 NOTE — Telephone Encounter (Signed)
Patient called requesting an antibiotic. She states she has an abscessed tooth. She tried calling her dentist, but no one answered. She says her dentist office has a recording that says their office is closed and wont reopen until Monday. Patient is having facial swelling along with pain, and wants an antibiotic called in. Symptoms started last night.  Patient denies any fevers, chills or sweats. Patient has been taking Ibuprofen, but hasn't seen any improvement. Please advise.   Pharmacy: [place]

## 2019-01-30 DIAGNOSIS — G8929 Other chronic pain: Secondary | ICD-10-CM | POA: Diagnosis not present

## 2019-01-30 DIAGNOSIS — M25512 Pain in left shoulder: Secondary | ICD-10-CM | POA: Diagnosis not present

## 2019-01-30 DIAGNOSIS — L409 Psoriasis, unspecified: Secondary | ICD-10-CM | POA: Diagnosis not present

## 2019-01-30 DIAGNOSIS — L4050 Arthropathic psoriasis, unspecified: Secondary | ICD-10-CM | POA: Diagnosis not present

## 2019-02-05 ENCOUNTER — Other Ambulatory Visit: Payer: Self-pay | Admitting: Family Medicine

## 2019-02-06 NOTE — Telephone Encounter (Signed)
L.O.V. was 01/16/2019, please advise.

## 2019-02-12 ENCOUNTER — Other Ambulatory Visit: Payer: Self-pay | Admitting: Family Medicine

## 2019-02-16 ENCOUNTER — Other Ambulatory Visit: Payer: Self-pay | Admitting: Family Medicine

## 2019-02-16 DIAGNOSIS — M1990 Unspecified osteoarthritis, unspecified site: Secondary | ICD-10-CM

## 2019-03-12 ENCOUNTER — Other Ambulatory Visit: Payer: Self-pay | Admitting: Physician Assistant

## 2019-05-15 ENCOUNTER — Other Ambulatory Visit: Payer: Self-pay | Admitting: Family Medicine

## 2019-05-15 DIAGNOSIS — R21 Rash and other nonspecific skin eruption: Secondary | ICD-10-CM | POA: Diagnosis not present

## 2019-05-15 DIAGNOSIS — M1990 Unspecified osteoarthritis, unspecified site: Secondary | ICD-10-CM

## 2019-05-15 DIAGNOSIS — Z79899 Other long term (current) drug therapy: Secondary | ICD-10-CM | POA: Diagnosis not present

## 2019-05-15 DIAGNOSIS — L4050 Arthropathic psoriasis, unspecified: Secondary | ICD-10-CM | POA: Diagnosis not present

## 2019-05-15 DIAGNOSIS — L409 Psoriasis, unspecified: Secondary | ICD-10-CM | POA: Diagnosis not present

## 2019-05-29 DIAGNOSIS — Z135 Encounter for screening for eye and ear disorders: Secondary | ICD-10-CM | POA: Diagnosis not present

## 2019-05-29 DIAGNOSIS — H524 Presbyopia: Secondary | ICD-10-CM | POA: Diagnosis not present

## 2019-06-14 ENCOUNTER — Other Ambulatory Visit: Payer: Self-pay

## 2019-06-21 DIAGNOSIS — L400 Psoriasis vulgaris: Secondary | ICD-10-CM | POA: Diagnosis not present

## 2019-06-21 DIAGNOSIS — R234 Changes in skin texture: Secondary | ICD-10-CM | POA: Diagnosis not present

## 2019-07-04 ENCOUNTER — Other Ambulatory Visit: Payer: Self-pay | Admitting: Family Medicine

## 2019-07-05 ENCOUNTER — Other Ambulatory Visit: Payer: Self-pay | Admitting: Family Medicine

## 2019-07-06 NOTE — Progress Notes (Signed)
Subjective:   Mariah [name] is a 73 y.o. female who presents for Medicare Annual (Subsequent) preventive examination.    This visit is being conducted through telemedicine due to the COVID-19 pandemic. This patient has given me verbal consent via doximity to conduct this visit, patient states they are participating from their home address. Some vital signs may be absent or patient reported.    Patient identification: identified by name, DOB, and current address  Review of Systems:  N/A  Cardiac Risk Factors include: advanced age (>73men, >68 women);smoking/ tobacco exposure;hypertension;dyslipidemia     Objective:     Vitals: There were no vitals taken for this visit.  There is no height or weight on file to calculate BMI. Unable to obtain vitals due to visit being conducted via telephonically.   Advanced Directives 07/10/2019 05/30/2018 02/25/2017 02/25/2017 11/24/2016 11/16/2016 08/10/2016  Does Patient Have a Medical Advance Directive? No No No No No No No  Type of Advance Directive - - - - - - -  Would patient like information on creating a medical advance directive? No - Patient declined No - Patient declined No - Patient declined - No - Patient declined No - Patient declined No - Patient declined    Tobacco Social History   Tobacco Use  Smoking Status Current Every Day Smoker  . Packs/day: 1.00  . Years: 0.00  . Pack years: 0.00  Smokeless Tobacco Never Used     Ready to quit: No Counseling given: No   Clinical Intake:  Pre-visit preparation completed: Yes  Pain : 0-10 Pain Score: 7  Pain Type: Chronic pain(due to arthritis) Pain Location: (all over body) Pain Descriptors / Indicators: Aching Pain Frequency: Constant     Nutritional Risks: None Diabetes: No  How often do you need to have someone help you when you read instructions, pamphlets, or other written materials from your doctor or pharmacy?: 1 - Never  Interpreter Needed?: No  Information entered  by :: [facility], LPN  Past Medical History:  Diagnosis Date  . Abdominal aortic aneurysm (AAA) ([place])   . Arthritis   . COPD (chronic obstructive pulmonary disease) ([place])   . GERD (gastroesophageal reflux disease)   . Headache    sinus  . History of adenomatous polyp of colon   . Hyperlipidemia   . Hypertension   . Psoriasis   . Right lumbar radiculitis   . Vitamin D deficiency    Past Surgical History:  Procedure Laterality Date  . APPENDECTOMY  2000  . BREAST BIOPSY Right 09/2016   radial scar;COMPLEX SCLEROSING LESION  . BREAST LUMPECTOMY Right 11/24/2016   COMPLEX SCLEROSING LESION  . BREAST LUMPECTOMY WITH NEEDLE LOCALIZATION Right 11/24/2016   Procedure: BREAST LUMPECTOMY WITH NEEDLE LOCALIZATION;  Surgeon: Leonie Green, MD;  Location: [facility] ORS;  Service: General;  Laterality: Right;  . COLONOSCOPY WITH PROPOFOL N/A 08/10/2016   Procedure: COLONOSCOPY WITH PROPOFOL;  Surgeon: Manya Silvas, MD;  Location: [facility] ENDOSCOPY;  Service: Endoscopy;  Laterality: N/A;  . ESOPHAGOGASTRODUODENOSCOPY (EGD) WITH PROPOFOL N/A 08/10/2016   Procedure: ESOPHAGOGASTRODUODENOSCOPY (EGD) WITH PROPOFOL;  Surgeon: Manya Silvas, MD;  [facility];  Service: Endoscopy;  Laterality: N/A;  . FOOT SURGERY Bilateral   . JOINT REPLACEMENT Right 01/27/2016   hip  . 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 Cancer Mother        kidney  . Arthritis Father   . Heart disease Father   . COPD Sister   . Fibromyalgia Sister   . Osteoarthritis Sister   . Osteoarthritis Maternal Aunt   . Colon cancer Paternal Aunt   . Colon cancer Paternal Uncle   . Colon cancer Paternal Grandmother   . Breast cancer Neg Hx    Social History   Socioeconomic History  . Marital  status: Divorced    Spouse name: Not on file  . Number of children: 0  . Years of education: 59  . Highest education level: Associate degree: occupational, Hotel manager, or vocational program  Occupational History    Comment: retired  Tobacco Use  . Smoking status: Current Every Day Smoker    Packs/day: 1.00    Years: 0.00    Pack years: 0.00  . Smokeless tobacco: Never Used  Substance and Sexual Activity  . Alcohol use: Yes    Comment: occasionally glass of wine  . Drug use: No  . Sexual activity: Not on file  Other Topics Concern  . Not on file  Social History Narrative  . Not on file   Social Determinants of Health   Financial Resource Strain: Medium Risk  . Difficulty of Paying Living Expenses: Somewhat hard  Food Insecurity: No Food Insecurity  . Worried About Charity fundraiser in the Last Year: Never true  . Ran Out of Food in the Last Year: Never true  Transportation Needs: No Transportation Needs  . Lack of Transportation (Medical): No  . Lack of Transportation (Non-Medical): No  Physical Activity: Inactive  . Days of Exercise per Week: 0 days  . Minutes of Exercise per Session: 0 min  Stress: No Stress Concern Present  . Feeling of Stress : Not at all  Social Connections: Slightly Isolated  . Frequency of Communication with Friends and Family: More than three times a week  . Frequency of Social Gatherings with Friends and Family: Twice a week  . Attends Religious Services: More than 4 times per year  . Active Member of Clubs or Organizations: Yes  . Attends Archivist Meetings: More than 4 times per year  . Marital Status: Divorced    Outpatient Encounter Medications as of 07/10/2019  Medication Sig  . albuterol (PROVENTIL HFA;VENTOLIN HFA) 108 (90 Base) MCG/ACT inhaler INHALE 1-2 PUFFS INTO THE LUNGS EVERY 6 HOURS AS NEEDED FOR WHEEZING OR SHORTNESS OF BREATH.  [name] aspirin 81 MG EC tablet Take 81 mg by mouth daily. TAKES THE ENTERIC COATED IN THE  MORNING AND THE CHEWABLE ASPIRIN IN THE EVENING  . betamethasone dipropionate (DIPROLENE) 0.05 % ointment Apply topically 2 (two) times daily. (Patient taking differently: Apply topically 2 (two) times daily. As needed)  . calcipotriene (DOVONOX) 0.005 % cream APPLY TO SKIN ONCE A DAY APPLY DAILY ON HANDS, FEET AND WRIST X 2 WEEKS, THEN USE 5 DAYS A WEEK  . Calcipotriene 0.005 % solution PLEASE SEE ATTACHED FOR DETAILED DIRECTIONS  . Cholecalciferol (VITAMIN D3) 2000 units TABS Take 2,000 Units by mouth daily.  . Cholestyramine POWD Take 1 Package by mouth as needed.  . clobetasol (TEMOVATE) 0.05 % external solution APPLY TO AFFECTED AREA TWICE A DAY  . Clobetasol Propionate 0.05 % shampoo Apply topically daily to scalp.  Let sit 10-15 min and then rinse.  . COSENTYX SENSOREADY, 300 MG, 150 MG/ML SOAJ  Inject 2 Syringes into the skin every 28 (twenty-eight) days.  . diphenhydrAMINE (BENADRYL) 25 MG tablet Take 25 mg by mouth at bedtime as needed.  . gabapentin (NEURONTIN) 300 MG capsule TAKE 1 CAPSULE BY MOUTH TWICE A DAY  . hydrochlorothiazide (HYDRODIURIL) 12.5 MG tablet TAKE 1 TABLET BY MOUTH EVERY DAY  . levocetirizine (XYZAL) 5 MG tablet TAKE 1 TABLET BY MOUTH EVERY DAY IN THE EVENING  . metoprolol succinate (TOPROL-XL) 50 MG 24 hr tablet TAKE 1 TABLET (50 MG TOTAL) BY MOUTH DAILY. TAKE WITH OR IMMEDIATELY FOLLOWING A MEAL.  . pantoprazole (PROTONIX) 20 MG tablet TAKE 1 TABLET (20 MG TOTAL) BY MOUTH 2 (TWO) TIMES DAILY BEFORE A MEAL.  [name] polyethylene glycol powder (GLYCOLAX/MIRALAX) powder Take 17 g by mouth 2 (two) times daily as needed.  [name] rOPINIRole (REQUIP) 1 MG tablet TAKE 1 TABLET (1 MG TOTAL) BY MOUTH AT BEDTIME.  . Secukinumab (COSENTYX SENSOREADY PEN) 150 MG/ML SOAJ Inject into the skin.  [name] simvastatin (ZOCOR) 20 MG tablet Take 1 tablet (20 mg total) by mouth daily.  . traMADol (ULTRAM) 50 MG tablet TAKE 1 TABLET BY MOUTH EVERY 6 HOURS AS NEEDED FOR MODERATE OR SEVERE PAIN  .  Adalimumab (HUMIRA [place]) Inject into the skin.  . fluticasone (FLONASE) 50 MCG/ACT nasal spray Place 2 sprays into both nostrils daily. (Patient not taking: Reported on 05/30/2018)   No facility-administered encounter medications on file as of 07/10/2019.    Activities of Daily Living In your present state of health, do you have any difficulty performing the following activities: 07/10/2019 07/18/2018  Hearing? Y Y  Comment Does not wear hearing aids. -  Vision? N N  Difficulty concentrating or making decisions? N Y  Walking or climbing stairs? N Y  Dressing or bathing? N N  Doing errands, shopping? N N  Preparing Food and eating ? N -  Using the Toilet? N -  In the past six months, have you accidently leaked urine? N -  Do you have problems with loss of bowel control? N -  Managing your Medications? N -  Managing your Finances? N -  Housekeeping or managing your Housekeeping? N -  Some recent data might be hidden    Patient Care Team: Bacigalupo, Dionne Bucy, MD as PCP - General (Family Medicine) Marry Guan, Laurice Record, MD as Consulting Physician (Orthopedic Surgery) Algernon Huxley, MD as Referring Physician (Vascular Surgery) Marlowe Sax, MD as Referring Physician (Internal Medicine)    Assessment:   This is a routine wellness examination for [place].  Exercise Activities and Dietary recommendations Current Exercise Habits: The patient does not participate in regular exercise at present, Exercise limited by: orthopedic condition(s)  Goals    . Quit smoking / using tobacco     Recommend to quit smoking. Start by tapering down until completely quit.        Fall Risk: Fall Risk  07/10/2019 07/18/2018 05/30/2018 02/25/2017 02/25/2017  Falls in the past year? 0 0 0 No No  Number falls in past yr: 0 - - - -  Injury with Fall? 0 - - - -    FALL RISK PREVENTION PERTAINING TO THE HOME:  Any stairs in or around the home? Yes  If so, are there any without handrails? No   Home  free of loose throw rugs in walkways, pet beds, electrical cords, etc? Yes  Adequate lighting in your home to reduce risk of falls? Yes   ASSISTIVE DEVICES UTILIZED TO PREVENT FALLS:  Life alert? No  Use of a cane, walker or w/c? Yes  Grab bars in the bathroom? No  Shower chair or bench in shower? No  Elevated toilet seat or a handicapped toilet? No    TIMED UP AND GO:  Was the test performed? No .    Depression Screen PHQ 2/9 Scores 07/10/2019 07/10/2019 07/18/2018 05/30/2018  PHQ - 2 Score 0 0 1 0  PHQ- 9 Score - - 4 -     Cognitive Function: Declined today.        Immunization History  Administered Date(s) Administered  . Influenza, High Dose Seasonal PF 06/24/2015, 03/16/2016, 07/15/2017, 06/05/2018  . Influenza-Unspecified 06/01/2018  . Pneumococcal Conjugate-13 06/24/2015  . Pneumococcal Polysaccharide-23 03/26/2011  . Tdap 03/26/2011    Qualifies for Shingles Vaccine? Yes . Due for Shingrix. Pt has been advised to call insurance company to determine out of pocket expense. Advised may also receive vaccine at local pharmacy or Health Dept. Verbalized acceptance and understanding.  Tdap: Up to date  Flu Vaccine: Due for Flu vaccine. Does the patient want to receive this vaccine today?  No . Pt plans to get this at the pharmacy today or tomorrow.   Pneumococcal Vaccine: Completed series  Screening Tests Health Maintenance  Topic Date Due  . INFLUENZA VACCINE  02/18/2019  . DEXA SCAN  08/08/2020  . MAMMOGRAM  08/15/2020  . TETANUS/TDAP  03/25/2021  . COLONOSCOPY  08/10/2021  . Hepatitis C Screening  Completed  . PNA vac Low Risk Adult  Completed    Cancer Screenings:  Colorectal Screening: Completed 08/10/16. Repeat every 5 years.  Mammogram: Completed 08/15/18. Repeat as advised every 1-2 years.  Bone Density: Completed 08/08/18. Results reflect OSTEOPOROSIS. Repeat every 2 years.   Lung Cancer Screening: (Low Dose CT Chest recommended if Age 50-80  years, 30 pack-year currently smoking OR have quit w/in 15years.) does qualify however declined referral today. Pt states she would like to "think on this."  Additional Screening:  Hepatitis C Screening: Up to date  Vision Screening: Recommended annual ophthalmology exams for early detection of glaucoma and other disorders of the eye.  Dental Screening: Recommended annual dental exams for proper oral hygiene  Community Resource Referral:  CRR required this visit?  No       Plan:  I have personally reviewed and addressed the Medicare Annual Wellness questionnaire and have noted the following in the patient's chart:  A. Medical and social history B. Use of alcohol, tobacco or illicit drugs  C. Current medications and supplements D. Functional ability and status E.  Nutritional status F.  Physical activity G. Advance directives H. List of other physicians I.  Hospitalizations, surgeries, and ER visits in previous 12 months J.  [place] such as hearing and vision if needed, cognitive and depression L. Referrals and appointments   In addition, I have reviewed and discussed with patient certain preventive protocols, quality metrics, and best practice recommendations. A written personalized care plan for preventive services as well as general preventive health recommendations were provided to patient. Nurse Health Advisor  Signed,    Ollivander See [place], [place]  [id number] Nurse Health Advisor   Nurse Notes: Pt plans to get her flu shot today or tomorrow.

## 2019-07-10 ENCOUNTER — Other Ambulatory Visit: Payer: Self-pay

## 2019-07-10 ENCOUNTER — Ambulatory Visit (INDEPENDENT_AMBULATORY_CARE_PROVIDER_SITE_OTHER): Payer: PPO

## 2019-07-10 DIAGNOSIS — Z0000 Encounter for general adult medical examination without abnormal findings: Secondary | ICD-10-CM | POA: Diagnosis not present

## 2019-07-10 NOTE — Patient Instructions (Addendum)
Mariah [name] , Thank you for taking time to come for your Medicare Wellness Visit. I appreciate your ongoing commitment to your health goals. Please review the following plan we discussed and let me know if I can assist you in the future.   Screening recommendations/referrals: Colonoscopy: Up to date, due 07/2021 Mammogram: Up to date, due 07/2020 Bone Density: Up to date, due 07/2020 Recommended yearly ophthalmology/optometry visit for glaucoma screening and checkup Recommended yearly dental visit for hygiene and checkup  Vaccinations: Influenza vaccine: Currently due, pt states that she plans to receive this today or tomorrow at her pharmacy.  Pneumococcal vaccine: Completed series Tdap vaccine: Up to date, due 03/2021 Shingles vaccine: Pt declines today.     Advanced directives: Advance directive discussed with you today. Even though you declined this today please call our office should you change your mind and we can give you the proper paperwork for you to fill out.  Conditions/risks identified: Smoking cessation discussed today.   Next appointment: 07/23/18 @ 2:00 PM with Dr Brita Romp   Preventive Care 65 Years and Older, Female Preventive care refers to lifestyle choices and visits with your health care provider that can promote health and wellness. What does preventive care include?  A yearly physical exam. This is also called an annual well check.  Dental exams once or twice a year.  Routine eye exams. Ask your health care provider how often you should have your eyes checked.  Personal lifestyle choices, including:  Daily care of your teeth and gums.  Regular physical activity.  Eating a healthy diet.  Avoiding tobacco and drug use.  Limiting alcohol use.  Practicing safe sex.  Taking low-dose aspirin every day.  Taking vitamin and mineral supplements as recommended by your health care provider. What happens during an annual well check? The services and screenings  done by your health care provider during your annual well check will depend on your age, overall health, lifestyle risk factors, and family history of disease. Counseling  Your health care provider may ask you questions about your:  Alcohol use.  Tobacco use.  Drug use.  Emotional well-being.  Home and relationship well-being.  Sexual activity.  Eating habits.  History of falls.  Memory and ability to understand (cognition).  Work and work Statistician.  Reproductive health. Screening  You may have the following tests or measurements:  Height, weight, and BMI.  Blood pressure.  Lipid and cholesterol levels. These may be checked every 5 years, or more frequently if you are over 58 years old.  Skin check.  Lung cancer screening. You may have this screening every year starting at age 36 if you have a 30-pack-year history of smoking and currently smoke or have quit within the past 15 years.  Fecal occult blood test (FOBT) of the stool. You may have this test every year starting at age 53.  Flexible sigmoidoscopy or colonoscopy. You may have a sigmoidoscopy every 5 years or a colonoscopy every 10 years starting at age 70.  Hepatitis C blood test.  Hepatitis B blood test.  Sexually transmitted disease (STD) testing.  Diabetes screening. This is done by checking your blood sugar (glucose) after you have not eaten for a while (fasting). You may have this done every 1-3 years.  Bone density scan. This is done to screen for osteoporosis. You may have this done starting at age 71.  Mammogram. This may be done every 1-2 years. Talk to your health care provider about how often you  should have regular mammograms. Talk with your health care provider about your test results, treatment options, and if necessary, the need for more tests. Vaccines  Your health care provider may recommend certain vaccines, such as:  Influenza vaccine. This is recommended every year.  Tetanus,  diphtheria, and acellular pertussis (Tdap, Td) vaccine. You may need a Td booster every 10 years.  Zoster vaccine. You may need this after age 46.  Pneumococcal 13-valent conjugate (PCV13) vaccine. One dose is recommended after age 29.  Pneumococcal polysaccharide (PPSV23) vaccine. One dose is recommended after age 65. Talk to your health care provider about which screenings and vaccines you need and how often you need them. This information is not intended to replace advice given to you by your health care provider. Make sure you discuss any questions you have with your health care provider. Document Released: 08/02/2015 Document Revised: 03/25/2016 Document Reviewed: 05/07/2015 Elsevier Interactive Patient Education  2017 [place] Prevention in the Home Falls can cause injuries. They can happen to people of all ages. There are many things you can do to make your home safe and to help prevent falls. What can I do on the outside of my home?  Regularly fix the edges of walkways and driveways and fix any cracks.  Remove anything that might make you trip as you walk through a door, such as a raised step or threshold.  Trim any bushes or trees on the path to your home.  Use bright outdoor lighting.  Clear any walking paths of anything that might make someone trip, such as rocks or tools.  Regularly check to see if handrails are loose or broken. Make sure that both sides of any steps have handrails.  Any raised decks and porches should have guardrails on the edges.  Have any leaves, snow, or ice cleared regularly.  Use sand or salt on walking paths during winter.  Clean up any spills in your garage right away. This includes oil or grease spills. What can I do in the bathroom?  Use night lights.  Install grab bars by the toilet and in the tub and shower. Do not use towel bars as grab bars.  Use non-skid mats or decals in the tub or shower.  If you need to sit down in  the shower, use a plastic, non-slip stool.  Keep the floor dry. Clean up any water that spills on the floor as soon as it happens.  Remove soap buildup in the tub or shower regularly.  Attach bath mats securely with double-sided non-slip rug tape.  Do not have throw rugs and other things on the floor that can make you trip. What can I do in the bedroom?  Use night lights.  Make sure that you have a light by your bed that is easy to reach.  Do not use any sheets or blankets that are too big for your bed. They should not hang down onto the floor.  Have a firm chair that has side arms. You can use this for support while you get dressed.  Do not have throw rugs and other things on the floor that can make you trip. What can I do in the kitchen?  Clean up any spills right away.  Avoid walking on wet floors.  Keep items that you use a lot in easy-to-reach places.  If you need to reach something above you, use a strong step stool that has a grab bar.  Keep electrical cords out  of the way.  Do not use floor polish or wax that makes floors slippery. If you must use wax, use non-skid floor wax.  Do not have throw rugs and other things on the floor that can make you trip. What can I do with my stairs?  Do not leave any items on the stairs.  Make sure that there are handrails on both sides of the stairs and use them. Fix handrails that are broken or loose. Make sure that handrails are as long as the stairways.  Check any carpeting to make sure that it is firmly attached to the stairs. Fix any carpet that is loose or worn.  Avoid having throw rugs at the top or bottom of the stairs. If you do have throw rugs, attach them to the floor with carpet tape.  Make sure that you have a light switch at the top of the stairs and the bottom of the stairs. If you do not have them, ask someone to add them for you. What else can I do to help prevent falls?  Wear shoes that:  Do not have high  heels.  Have rubber bottoms.  Are comfortable and fit you well.  Are closed at the toe. Do not wear sandals.  If you use a stepladder:  Make sure that it is fully opened. Do not climb a closed stepladder.  Make sure that both sides of the stepladder are locked into place.  Ask someone to hold it for you, if possible.  Clearly mark and make sure that you can see:  Any grab bars or handrails.  First and last steps.  Where the edge of each step is.  Use tools that help you move around (mobility aids) if they are needed. These include:  Canes.  Walkers.  Scooters.  Crutches.  Turn on the lights when you go into a dark area. Replace any light bulbs as soon as they burn out.  Set up your furniture so you have a clear path. Avoid moving your furniture around.  If any of your floors are uneven, fix them.  If there are any pets around you, be aware of where they are.  Review your medicines with your doctor. Some medicines can make you feel dizzy. This can increase your chance of falling. Ask your doctor what other things that you can do to help prevent falls. This information is not intended to replace advice given to you by your health care provider. Make sure you discuss any questions you have with your health care provider. Document Released: 05/02/2009 Document Revised: 12/12/2015 Document Reviewed: 08/10/2014 Elsevier Interactive Patient Education  2017 [employer].

## 2019-07-24 ENCOUNTER — Encounter: Payer: PPO | Admitting: Family Medicine

## 2019-07-24 ENCOUNTER — Other Ambulatory Visit: Payer: Self-pay

## 2019-07-24 ENCOUNTER — Ambulatory Visit (INDEPENDENT_AMBULATORY_CARE_PROVIDER_SITE_OTHER): Payer: PPO | Admitting: Family Medicine

## 2019-07-24 ENCOUNTER — Encounter: Payer: Self-pay | Admitting: Family Medicine

## 2019-07-24 VITALS — BP 123/83 | HR 71 | Temp 96.8°F | Wt 156.0 lb

## 2019-07-24 DIAGNOSIS — D692 Other nonthrombocytopenic purpura: Secondary | ICD-10-CM

## 2019-07-24 DIAGNOSIS — L4050 Arthropathic psoriasis, unspecified: Secondary | ICD-10-CM | POA: Diagnosis not present

## 2019-07-24 DIAGNOSIS — I10 Essential (primary) hypertension: Secondary | ICD-10-CM | POA: Diagnosis not present

## 2019-07-24 DIAGNOSIS — I714 Abdominal aortic aneurysm, without rupture, unspecified: Secondary | ICD-10-CM

## 2019-07-24 DIAGNOSIS — F17200 Nicotine dependence, unspecified, uncomplicated: Secondary | ICD-10-CM | POA: Diagnosis not present

## 2019-07-24 DIAGNOSIS — J449 Chronic obstructive pulmonary disease, unspecified: Secondary | ICD-10-CM | POA: Diagnosis not present

## 2019-07-24 DIAGNOSIS — E7800 Pure hypercholesterolemia, unspecified: Secondary | ICD-10-CM | POA: Diagnosis not present

## 2019-07-24 DIAGNOSIS — R739 Hyperglycemia, unspecified: Secondary | ICD-10-CM | POA: Diagnosis not present

## 2019-07-24 DIAGNOSIS — Z1231 Encounter for screening mammogram for malignant neoplasm of breast: Secondary | ICD-10-CM

## 2019-07-24 DIAGNOSIS — D582 Other hemoglobinopathies: Secondary | ICD-10-CM

## 2019-07-24 DIAGNOSIS — Z0000 Encounter for general adult medical examination without abnormal findings: Secondary | ICD-10-CM

## 2019-07-24 DIAGNOSIS — I739 Peripheral vascular disease, unspecified: Secondary | ICD-10-CM

## 2019-07-24 NOTE — Assessment & Plan Note (Signed)
Well controlled Continue current medications Recheck metabolic panel F/u in 6 months  

## 2019-07-24 NOTE — Assessment & Plan Note (Signed)
Chronic, stable, well controlled Continue current meds Stressed importance of tobacco cessation Acute exacerbation precautions

## 2019-07-24 NOTE — Patient Instructions (Signed)
Time for a mammogram, follow-up with Dr Lucky Cowboy, and labs   Preventive Care 75 Years and Older, Female Preventive care refers to lifestyle choices and visits with your health care provider that can promote health and wellness. This includes:  A yearly physical exam. This is also called an annual well check.  Regular dental and eye exams.  Immunizations.  Screening for certain conditions.  Healthy lifestyle choices, such as diet and exercise. What can I expect for my preventive care visit? Physical exam Your health care provider will check:  Height and weight. These may be used to calculate body mass index (BMI), which is a measurement that tells if you are at a healthy weight.  Heart rate and blood pressure.  Your skin for abnormal spots. Counseling Your health care provider may ask you questions about:  Alcohol, tobacco, and drug use.  Emotional well-being.  Home and relationship well-being.  Sexual activity.  Eating habits.  History of falls.  Memory and ability to understand (cognition).  Work and work Statistician.  Pregnancy and menstrual history. What immunizations do I need?  Influenza (flu) vaccine  This is recommended every year. Tetanus, diphtheria, and pertussis (Tdap) vaccine  You may need a Td booster every 10 years. Varicella (chickenpox) vaccine  You may need this vaccine if you have not already been vaccinated. Zoster (shingles) vaccine  You may need this after age 67. Pneumococcal conjugate (PCV13) vaccine  One dose is recommended after age 67. Pneumococcal polysaccharide (PPSV23) vaccine  One dose is recommended after age 31. Measles, mumps, and rubella (MMR) vaccine  You may need at least one dose of MMR if you were born in 1957 or later. You may also need a second dose. Meningococcal conjugate (MenACWY) vaccine  You may need this if you have certain conditions. Hepatitis A vaccine  You may need this if you have certain conditions  or if you travel or work in places where you may be exposed to hepatitis A. Hepatitis B vaccine  You may need this if you have certain conditions or if you travel or work in places where you may be exposed to hepatitis B. Haemophilus influenzae type b (Hib) vaccine  You may need this if you have certain conditions. You may receive vaccines as individual doses or as more than one vaccine together in one shot (combination vaccines). Talk with your health care provider about the risks and benefits of combination vaccines. What tests do I need? Blood tests  Lipid and cholesterol levels. These may be checked every 5 years, or more frequently depending on your overall health.  Hepatitis C test.  Hepatitis B test. Screening  Lung cancer screening. You may have this screening every year starting at age 30 if you have a 30-pack-year history of smoking and currently smoke or have quit within the past 15 years.  Colorectal cancer screening. All adults should have this screening starting at age 54 and continuing until age 31. Your health care provider may recommend screening at age 48 if you are at increased risk. You will have tests every 1-10 years, depending on your results and the type of screening test.  Diabetes screening. This is done by checking your blood sugar (glucose) after you have not eaten for a while (fasting). You may have this done every 1-3 years.  Mammogram. This may be done every 1-2 years. Talk with your health care provider about how often you should have regular mammograms.  BRCA-related cancer screening. This may be done if  you have a family history of breast, ovarian, tubal, or peritoneal cancers. Other tests  Sexually transmitted disease (STD) testing.  Bone density scan. This is done to screen for osteoporosis. You may have this done starting at age 53. Follow these instructions at home: Eating and drinking  Eat a diet that includes fresh fruits and vegetables,  whole grains, lean protein, and low-fat dairy products. Limit your intake of foods with high amounts of sugar, saturated fats, and salt.  Take vitamin and mineral supplements as recommended by your health care provider.  Do not drink alcohol if your health care provider tells you not to drink.  If you drink alcohol: ? Limit how much you have to 0-1 drink a day. ? Be aware of how much alcohol is in your drink. In the [place] one drink equals one 12 oz bottle of beer (355 mL), one 5 oz glass of wine (148 mL), or one 1 oz glass of hard liquor (44 mL). Lifestyle  Take daily care of your teeth and gums.  Stay active. Exercise for at least 30 minutes on 5 or more days each week.  Do not use any products that contain nicotine or tobacco, such as cigarettes, e-cigarettes, and chewing tobacco. If you need help quitting, ask your health care provider.  If you are sexually active, practice safe sex. Use a condom or other form of protection in order to prevent STIs (sexually transmitted infections).  Talk with your health care provider about taking a low-dose aspirin or statin. What's next?  Go to your health care provider once a year for a well check visit.  Ask your health care provider how often you should have your eyes and teeth checked.  Stay up to date on all vaccines. This information is not intended to replace advice given to you by your health care provider. Make sure you discuss any questions you have with your health care provider. Document Revised: 06/30/2018 Document Reviewed: 06/30/2018 Elsevier Patient Education  2020 [employer].

## 2019-07-24 NOTE — Assessment & Plan Note (Signed)
Again discussed the importance of cessation She has many other risk factors including COPD, PAD, HTN, AAA, HLD Patient is not interested in quitting at this time We will continue to assess 3 to 5-minute discussion

## 2019-07-24 NOTE — Assessment & Plan Note (Signed)
Chronic and stable Continue to monitor 

## 2019-07-24 NOTE — Assessment & Plan Note (Signed)
Followed by vascular surgery Stable/slightly smaller in size on last ultrasound Continue to monitor annually

## 2019-07-24 NOTE — Assessment & Plan Note (Signed)
Recheck hemoglobin A1c

## 2019-07-24 NOTE — Assessment & Plan Note (Signed)
Recheck CBC Likely related to smoking

## 2019-07-24 NOTE — Assessment & Plan Note (Signed)
No current limb threatening symptoms Followed by vascular surgery Important to stop smoking Important have good blood pressure and other risk factor control

## 2019-07-24 NOTE — Progress Notes (Signed)
Patient: Mariah [name], Female    DOB: 1945-11-27, 74 y.o.   MRN: [id number] Visit Date: 07/24/2019  Today's Provider: Lavon Paganini, MD   Chief Complaint  Patient presents with  . Annual Exam   Subjective:    I, Tiburcio Pea, CMA, am acting as a scribe for Lavon Paganini, MD.    Patient had a AWE with [facility]enzie on 07/10/2019   Annual physical exam ANAI KHATUN is a 74 y.o. female who presents today for health maintenance and complete physical. She feels well. She reports exercising lightly since COVID. She reports she is sleeping fairly well.   Due for a mammogram UTD on DEXA and colonoscopy  She reports that she had some worrisome skin findings on her upper back that her rheumatologist advised her to see dermatology for.  She states that the dermatologist gave her topical steroids for her psoriasis but she did not feel like they paid much attention to her back. -----------------------------------------------------------------   Review of Systems  Constitutional: Positive for activity change.  HENT: Positive for dental problem and postnasal drip.   Eyes: Negative.   Respiratory: Negative.   Cardiovascular: Negative.   Gastrointestinal: Negative.   Endocrine: Negative.   Genitourinary: Negative.   Musculoskeletal: Positive for arthralgias and back pain.  Skin: Negative.   Allergic/Immunologic: Negative.   Neurological: Negative.   Hematological: Negative.   Psychiatric/Behavioral: Negative.     Social History      She  reports that she has been smoking. She has been smoking about 1.00 pack per day for the past 0.00 years. She has never used smokeless tobacco. She reports current alcohol use. She reports that she does not use drugs.       Social History   Socioeconomic History  . Marital status: Divorced    Spouse name: Not on file  . Number of children: 0  . Years of education: 45  . Highest education level: Associate degree: occupational, Hotel manager,  or vocational program  Occupational History    Comment: retired  Tobacco Use  . Smoking status: Current Every Day Smoker    Packs/day: 1.00    Years: 0.00    Pack years: 0.00  . Smokeless tobacco: Never Used  Substance and Sexual Activity  . Alcohol use: Yes    Comment: occasionally glass of wine  . Drug use: No  . Sexual activity: Not on file  Other Topics Concern  . Not on file  Social History Narrative  . Not on file   Social Determinants of Health   Financial Resource Strain: Medium Risk  . Difficulty of Paying Living Expenses: Somewhat hard  Food Insecurity: No Food Insecurity  . Worried About Charity fundraiser in the Last Year: Never true  . Ran Out of Food in the Last Year: Never true  Transportation Needs: No Transportation Needs  . Lack of Transportation (Medical): No  . Lack of Transportation (Non-Medical): No  Physical Activity: Inactive  . Days of Exercise per Week: 0 days  . Minutes of Exercise per Session: 0 min  Stress: No Stress Concern Present  . Feeling of Stress : Not at all  Social Connections: Slightly Isolated  . Frequency of Communication with Friends and Family: More than three times a week  . Frequency of Social Gatherings with Friends and Family: Twice a week  . Attends Religious Services: More than 4 times per year  . Active Member of Clubs or Organizations: Yes  . Attends Club  or Organization Meetings: More than 4 times per year  . Marital Status: Divorced    Past Medical History:  Diagnosis Date  . Abdominal aortic aneurysm (AAA) ([place])   . Arthritis   . COPD (chronic obstructive pulmonary disease) ([place])   . GERD (gastroesophageal reflux disease)   . Headache    sinus  . History of adenomatous polyp of colon   . Hyperlipidemia   . Hypertension   . Psoriasis   . Right lumbar radiculitis   . Vitamin D deficiency      Patient Active Problem List   Diagnosis Date Noted  . Allergic rhinitis 01/10/2018  . Cholelithiasis 03/16/2016   . Peripheral arterial disease ([place]) 02/05/2016  . 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 Abdominal aortic aneurysm (AAA) ([place]) 01/13/2016  . 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 Nerve root inflammation 10/25/2015  . Psoriatic arthritis ([place]) 10/07/2015  . 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 Arthralgia of hip 08/12/2015  . Arthralgia of multiple joints 08/12/2015  . Age related osteoporosis 08/12/2015  . Chronic obstructive pulmonary disease ([place]) 06/24/2015  . Senile purpura ([place]) 06/24/2015  . Abnormal serum level of alkaline phosphatase 06/18/2015  . Elevated hemoglobin ([place]) 06/18/2015  . Fatigue 06/18/2015  . Fistula 06/18/2015  . Blood glucose elevated 06/18/2015  . Gastro-esophageal reflux disease without esophagitis 06/18/2015  . Restless leg 06/18/2015  . Avitaminosis D 06/18/2015  . Mechanical and motor problems with internal organs 10/11/2009  . Hypercholesteremia 10/11/2009  . Arthritis, degenerative 09/25/2009  . Essential (primary) hypertension 09/25/2009  . Psoriasis 09/25/2009  . Compulsive tobacco user syndrome 09/25/2009    Past Surgical History:  Procedure Laterality Date  . APPENDECTOMY  2000  . BREAST BIOPSY Right 09/2016   radial scar;COMPLEX SCLEROSING LESION  . BREAST LUMPECTOMY Right 11/24/2016   COMPLEX SCLEROSING LESION  . BREAST LUMPECTOMY WITH NEEDLE LOCALIZATION Right 11/24/2016   Procedure: BREAST LUMPECTOMY WITH NEEDLE LOCALIZATION;  Surgeon: Leonie Green, MD;  Location: [facility] ORS;  Service: General;  Laterality: Right;  . COLONOSCOPY WITH PROPOFOL N/A 08/10/2016   Procedure: COLONOSCOPY WITH PROPOFOL;  Surgeon: Manya Silvas, MD;  Location[facility] ENDOSCOPY;  Service: Endoscopy;  Laterality: N/A;  . ESOPHAGOGASTRODUODENOSCOPY (EGD) WITH PROPOFOL N/A 08/10/2016   Procedure: ESOPHAGOGASTRODUODENOSCOPY (EGD) WITH PROPOFOL;  Surgeon: Manya Silvas, MD;  Location: [facility] ENDOSCOPY;  Service: Endoscopy;  Laterality: N/A;  . FOOT SURGERY  Bilateral   . JOINT REPLACEMENT Right 01/27/2016   hip  . 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 TOTAL HIP ARTHROPLASTY Right 01/27/2016   Procedure: TOTAL HIP ARTHROPLASTY;  Surgeon: Dereck Leep, MD;  Location: [facility] ORS;  Service: Orthopedics;  Laterality: Right;    Family History        Family Status  Relation Name Status  . Mother  Deceased  . Father  Deceased at age 34  . Sister  Deceased  . Mat Aunt  (Not Specified)  . Ethlyn Daniels  (Not Specified)  . Annamarie Major  (Not Specified)  . PGM  (Not Specified)  . Neg Hx  (Not Specified)        Her family history includes Arthritis in her father; COPD in her sister; Cancer in her mother; Colon cancer in her paternal aunt, paternal grandmother, and paternal uncle; Fibromyalgia in her sister; Heart disease in her father; Osteoarthritis in her maternal aunt and sister. There is no history of Breast cancer.      Allergies  Allergen Reactions  . Clindamycin/Lincomycin  diarrhea  . Amoxicillin Diarrhea     Current Outpatient Medications:  .  albuterol (PROVENTIL HFA;VENTOLIN HFA) 108 (90 Base) MCG/ACT inhaler, INHALE 1-2 PUFFS INTO THE LUNGS EVERY 6 HOURS AS NEEDED FOR WHEEZING OR SHORTNESS OF BREATH., Disp: 8.5 Inhaler, Rfl: 5 .  aspirin 81 MG EC tablet, Take 81 mg by mouth daily. TAKES THE ENTERIC COATED IN THE MORNING AND THE CHEWABLE ASPIRIN IN THE EVENING, Disp: , Rfl:  .  betamethasone dipropionate (DIPROLENE) 0.05 % ointment, Apply topically 2 (two) times daily. (Patient taking differently: Apply topically 2 (two) times daily. As needed), Disp: 45 g, Rfl: 5 .  calcipotriene (DOVONOX) 0.005 % cream, APPLY TO SKIN ONCE A DAY APPLY DAILY ON HANDS, FEET AND WRIST X 2 WEEKS, THEN USE 5 DAYS A WEEK, Disp: , Rfl:  .  Calcipotriene 0.005 % solution, PLEASE SEE ATTACHED FOR DETAILED DIRECTIONS, Disp: , Rfl:  .  Cholecalciferol (VITAMIN D3) 2000 units TABS, Take 2,000 Units by mouth daily., Disp: , Rfl:  .  Cholestyramine POWD, Take 1 Package by  mouth as needed., Disp: 60 g, Rfl: 5 .  clobetasol (TEMOVATE) 0.05 % external solution, APPLY TO AFFECTED AREA TWICE A DAY, Disp: 50 mL, Rfl: 3 .  Clobetasol Propionate 0.05 % shampoo, Apply topically daily to scalp.  Let sit 10-15 min and then rinse., Disp: 118 mL, Rfl: 1 .  COSENTYX SENSOREADY, 300 MG, 150 MG/ML SOAJ, Inject 2 Syringes into the skin every 28 (twenty-eight) days., Disp: , Rfl:  .  diphenhydrAMINE (BENADRYL) 25 MG tablet, Take 25 mg by mouth at bedtime as needed., Disp: , Rfl:  .  gabapentin (NEURONTIN) 300 MG capsule, TAKE 1 CAPSULE BY MOUTH TWICE A DAY, Disp: 180 capsule, Rfl: 1 .  hydrochlorothiazide (HYDRODIURIL) 12.5 MG tablet, TAKE 1 TABLET BY MOUTH EVERY DAY, Disp: 90 tablet, Rfl: 0 .  levocetirizine (XYZAL) 5 MG tablet, TAKE 1 TABLET BY MOUTH EVERY DAY IN THE EVENING, Disp: 90 tablet, Rfl: 4 .  metoprolol succinate (TOPROL-XL) 50 MG 24 hr tablet, TAKE 1 TABLET (50 MG TOTAL) BY MOUTH DAILY. TAKE WITH OR IMMEDIATELY FOLLOWING A MEAL., Disp: 90 tablet, Rfl: 0 .  pantoprazole (PROTONIX) 20 MG tablet, TAKE 1 TABLET (20 MG TOTAL) BY MOUTH 2 (TWO) TIMES DAILY BEFORE A MEAL., Disp: 180 tablet, Rfl: 0 .  polyethylene glycol powder (GLYCOLAX/MIRALAX) powder, Take 17 g by mouth 2 (two) times daily as needed., Disp: 3350 g, Rfl: 1 .  rOPINIRole (REQUIP) 1 MG tablet, TAKE 1 TABLET (1 MG TOTAL) BY MOUTH AT BEDTIME., Disp: 90 tablet, Rfl: 0 .  Secukinumab (COSENTYX SENSOREADY PEN) 150 MG/ML SOAJ, Inject into the skin., Disp: , Rfl:  .  simvastatin (ZOCOR) 20 MG tablet, Take 1 tablet (20 mg total) by mouth daily., Disp: 90 tablet, Rfl: 3 .  traMADol (ULTRAM) 50 MG tablet, TAKE 1 TABLET BY MOUTH EVERY 6 HOURS AS NEEDED FOR MODERATE OR SEVERE PAIN, Disp: 100 tablet, Rfl: 2 .  fluticasone (FLONASE) 50 MCG/ACT nasal spray, Place 2 sprays into both nostrils daily. (Patient not taking: Reported on 05/30/2018), Disp: 16 g, Rfl: 6   Patient Care Team: [name], MD as PCP - General  (Family Medicine) Hooten, Laurice Record, MD as Consulting Physician (Orthopedic Surgery) Dew, Erskine Squibb, MD as Referring Physician (Vascular Surgery) Marlowe Sax, MD as Referring Physician (Internal Medicine)    Objective:    Vitals: BP 123/83 (BP Location: Left Arm, Patient Position: Sitting, Cuff Size: Normal)   Pulse 71  Temp (!) 96.8 F (36 C) (Temporal)   Wt 156 lb (70.8 kg)   BMI 29.00 kg/m    Vitals:   07/24/19 1409  BP: 123/83  Pulse: 71  Temp: (!) 96.8 F (36 C)  TempSrc: Temporal  Weight: 156 lb (70.8 kg)     Physical Exam Vitals reviewed.  Constitutional:      General: She is not in acute distress.    Appearance: Normal appearance. She is well-developed. She is not diaphoretic.  HENT:     Head: Normocephalic and atraumatic.     Right Ear: Tympanic membrane, ear canal and external ear normal.     Left Ear: Tympanic membrane, ear canal and external ear normal.  Eyes:     General: No scleral icterus.    Conjunctiva/sclera: Conjunctivae normal.     Pupils: Pupils are equal, round, and reactive to light.  Neck:     Thyroid: No thyromegaly.  Cardiovascular:     Rate and Rhythm: Normal rate and regular rhythm.     Pulses: Normal pulses.     Heart sounds: Normal heart sounds. No murmur.  Pulmonary:     Effort: Pulmonary effort is normal. No respiratory distress.     Breath sounds: Normal breath sounds. No wheezing or rales.  Abdominal:     General: There is no distension.     Palpations: Abdomen is soft.     Tenderness: There is no abdominal tenderness.  Musculoskeletal:        General: No deformity.     Cervical back: Neck supple.     Right lower leg: No edema.     Left lower leg: No edema.  Lymphadenopathy:     Cervical: No cervical adenopathy.  Skin:    General: Skin is warm and dry.     Capillary Refill: Capillary refill takes less than 2 seconds.     Comments: +psoriasis Appears to have a non-healing wound on R upper back. - Advised to see  Dermatology again  Neurological:     Mental Status: She is alert and oriented to person, place, and time. Mental status is at baseline.     Gait: Gait normal.  Psychiatric:        Mood and Affect: Mood normal.        Behavior: Behavior normal.        Thought Content: Thought content normal.      Depression Screen PHQ 2/9 Scores 07/10/2019 07/10/2019 07/18/2018 05/30/2018  PHQ - 2 Score 0 0 1 0  PHQ- 9 Score - - 4 -       Assessment & Plan:     Routine Health Maintenance and Physical Exam  Exercise Activities and Dietary recommendations Goals    . Quit smoking / using tobacco     Recommend to quit smoking. Start by tapering down until completely quit.        Immunization History  Administered Date(s) Administered  . Influenza, High Dose Seasonal PF 06/24/2015, 03/16/2016, 07/15/2017, 06/05/2018, 07/12/2019  . Influenza-Unspecified 06/01/2018  . Pneumococcal Conjugate-13 06/24/2015  . Pneumococcal Polysaccharide-23 03/26/2011  . Tdap 03/26/2011    Health Maintenance  Topic Date Due  . DEXA SCAN  08/08/2020  . MAMMOGRAM  08/15/2020  . TETANUS/TDAP  03/25/2021  . COLONOSCOPY  08/10/2021  . INFLUENZA VACCINE  Completed  . Hepatitis C Screening  Completed  . PNA vac Low Risk Adult  Completed     Discussed health benefits of physical activity, and encouraged her to engage  in regular exercise appropriate for her age and condition.     Advised her to follow-up with dermatology for her nonhealing wound on her right upper back.  Concern for possible SCC --------------------------------------------------------------------  Problem List Items Addressed This Visit      Cardiovascular and Mediastinum   Essential (primary) hypertension    Well controlled Continue current medications Recheck metabolic panel F/u in 6 months       Relevant Orders   CMP (Comprehensive metabolic panel)   Senile purpura (HCC)    Chronic and stable Continue to monitor       Abdominal aortic aneurysm (AAA) (HCC)    Followed by vascular surgery Stable/slightly smaller in size on last ultrasound Continue to monitor annually      Peripheral arterial disease (HCC)    No current limb threatening symptoms Followed by vascular surgery Important to stop smoking Important have good blood pressure and other risk factor control        Respiratory   Chronic obstructive pulmonary disease (HCC)    Chronic, stable, well controlled Continue current meds Stressed importance of tobacco cessation Acute exacerbation precautions        Musculoskeletal and Integument   Psoriatic arthritis ([place])    Followed by rheumatology On Cosentyx Check CBC, CMP May be due for quant gold screening as well, but we will not check this today as she is seeing rheumatology soon      Relevant Orders   CBC     Other   Elevated hemoglobin ([place])    Recheck CBC Likely related to smoking      Relevant Orders   CBC   Hypercholesteremia    Doing well on simvastatin-we will continue Recheck CMP and FLP      Relevant Orders   CMP (Comprehensive metabolic panel)   Lipid panel   Blood glucose elevated    Recheck hemoglobin A1c      Relevant Orders   Hemoglobin A1c   Compulsive tobacco user syndrome    Again discussed the importance of cessation She has many other risk factors including COPD, PAD, HTN, AAA, HLD Patient is not interested in quitting at this time We will continue to assess 3 to 5-minute discussion       Other Visit Diagnoses    Encounter for annual physical exam    -  Primary   Relevant Orders   MM 3D SCREEN BREAST BILATERAL   CMP (Comprehensive metabolic panel)   Lipid panel   Hemoglobin A1c   CBC   Encounter for screening mammogram for breast cancer       Relevant Orders   MM 3D SCREEN BREAST BILATERAL       Return in about 6 months (around 01/21/2020) for chronic disease f/u.   The entirety of the information documented in the History of  Present Illness, Review of Systems and Physical Exam were personally obtained by me. Portions of this information were initially documented by Tiburcio Pea, CMA and reviewed by me for thoroughness and accuracy.    Lore Polka, Dionne Bucy, MD MPH [facility]

## 2019-07-24 NOTE — Assessment & Plan Note (Signed)
Doing well on simvastatin-we will continue Recheck CMP and FLP

## 2019-07-24 NOTE — Assessment & Plan Note (Signed)
Followed by rheumatology On Cosentyx Check CBC, CMP May be due for quant gold screening as well, but we will not check this today as she is seeing rheumatology soon

## 2019-07-25 ENCOUNTER — Telehealth: Payer: Self-pay

## 2019-07-25 NOTE — Telephone Encounter (Signed)
Copied from [place] [id number]. Topic: General - Other >> Jul 25, 2019  2:00 PM Lennox Solders wrote: Reason for CRM: pt saw dr b yesterday and she can not remember the name of female doctor she recommended at [facility]

## 2019-07-26 NOTE — Telephone Encounter (Signed)
Please adivse

## 2019-07-26 NOTE — Telephone Encounter (Signed)
Patient advised as below.  

## 2019-07-26 NOTE — Telephone Encounter (Signed)
We talked about Dr Laurence Ferrari

## 2019-07-27 ENCOUNTER — Ambulatory Visit: Payer: PPO

## 2019-07-27 ENCOUNTER — Encounter: Payer: PPO | Admitting: Family Medicine

## 2019-08-08 DIAGNOSIS — I10 Essential (primary) hypertension: Secondary | ICD-10-CM | POA: Diagnosis not present

## 2019-08-08 DIAGNOSIS — L4050 Arthropathic psoriasis, unspecified: Secondary | ICD-10-CM | POA: Diagnosis not present

## 2019-08-08 DIAGNOSIS — D582 Other hemoglobinopathies: Secondary | ICD-10-CM | POA: Diagnosis not present

## 2019-08-08 DIAGNOSIS — Z0000 Encounter for general adult medical examination without abnormal findings: Secondary | ICD-10-CM | POA: Diagnosis not present

## 2019-08-08 DIAGNOSIS — R739 Hyperglycemia, unspecified: Secondary | ICD-10-CM | POA: Diagnosis not present

## 2019-08-08 DIAGNOSIS — E7800 Pure hypercholesterolemia, unspecified: Secondary | ICD-10-CM | POA: Diagnosis not present

## 2019-08-09 ENCOUNTER — Telehealth: Payer: Self-pay

## 2019-08-09 LAB — COMPREHENSIVE METABOLIC PANEL
ALT: 12 IU/L (ref 0–32)
AST: 22 IU/L (ref 0–40)
Albumin/Globulin Ratio: 1.9 (ref 1.2–2.2)
Albumin: 4.1 g/dL (ref 3.7–4.7)
Alkaline Phosphatase: 161 IU/L — ABNORMAL HIGH (ref 39–117)
BUN/Creatinine Ratio: 6 — ABNORMAL LOW (ref 12–28)
BUN: 8 mg/dL (ref 8–27)
Bilirubin Total: 0.6 mg/dL (ref 0.0–1.2)
CO2: 27 mmol/L (ref 20–29)
Calcium: 9.5 mg/dL (ref 8.7–10.3)
Chloride: 96 mmol/L (ref 96–106)
Creatinine, Ser: 1.24 mg/dL — ABNORMAL HIGH (ref 0.57–1.00)
GFR calc Af Amer: 49 mL/min/{1.73_m2} — ABNORMAL LOW (ref >59)
GFR calc non Af Amer: 43 mL/min/{1.73_m2} — ABNORMAL LOW (ref >59)
Globulin, Total: 2.2 g/dL (ref 1.5–4.5)
Glucose: 90 mg/dL (ref 65–99)
Potassium: 3.3 mmol/L — ABNORMAL LOW (ref 3.5–5.2)
Sodium: 138 mmol/L (ref 134–144)
Total Protein: 6.3 g/dL (ref 6.0–8.5)

## 2019-08-09 LAB — CBC
Hematocrit: 46 % (ref 34.0–46.6)
Hemoglobin: 15.3 g/dL (ref 11.1–15.9)
MCH: 29.1 pg (ref 26.6–33.0)
MCHC: 33.3 g/dL (ref 31.5–35.7)
MCV: 88 fL (ref 79–97)
Platelets: 205 10*3/uL (ref 150–450)
RBC: 5.25 x10E6/uL (ref 3.77–5.28)
RDW: 14 % (ref 11.7–15.4)
WBC: 6.1 10*3/uL (ref 3.4–10.8)

## 2019-08-09 LAB — HEMOGLOBIN A1C
Est. average glucose Bld gHb Est-mCnc: 111 mg/dL
Hgb A1c MFr Bld: 5.5 % (ref 4.8–5.6)

## 2019-08-09 LAB — LIPID PANEL
Chol/HDL Ratio: 3.1 ratio (ref 0.0–4.4)
Cholesterol, Total: 163 mg/dL (ref 100–199)
HDL: 53 mg/dL (ref >39)
LDL Chol Calc (NIH): 85 mg/dL (ref 0–99)
Triglycerides: 146 mg/dL (ref 0–149)
VLDL Cholesterol Cal: 25 mg/dL (ref 5–40)

## 2019-08-09 NOTE — Telephone Encounter (Signed)
Patient advised as below. Patient denies any right upper quadrant pain.

## 2019-08-09 NOTE — Telephone Encounter (Signed)
-----   Message from [name], MD sent at 08/09/2019  2:28 PM EST ----- Normal labs, except for elevation of kidney function and alkaline phosphatase.  Potassium is low as well.  If she staying well-hydrated?  Is she having any right upper quadrant pain?  Recommend eating a potassium rich diet and rechecking labs in 1 month.  If she is having any right upper quadrant pain, I would recommend an ultrasound of her liver.

## 2019-08-13 ENCOUNTER — Other Ambulatory Visit: Payer: Self-pay | Admitting: Family Medicine

## 2019-08-13 DIAGNOSIS — M1990 Unspecified osteoarthritis, unspecified site: Secondary | ICD-10-CM

## 2019-08-13 NOTE — Telephone Encounter (Signed)
Requested medication (s) are due for refill today: yes  Requested medication (s) are on the active medication list: yes  Last refill:  05/16/19  Future visit scheduled: yes  Notes to clinic:  medication not delegated to NT to refill   Requested Prescriptions  Pending Prescriptions Disp Refills   traMADol (ULTRAM) 50 MG tablet [Pharmacy Med Name: TRAMADOL HCL 50 MG TABLET] 100 tablet 2    Sig: TAKE 1 TABLET BY MOUTH EVERY 6 HOURS AS NEEDED FOR MODERATE OR SEVERE PAIN      Not Delegated - Analgesics:  Opioid Agonists Failed - 08/13/2019  2:50 PM      Failed - This refill cannot be delegated      Failed - Urine Drug Screen completed in last 360 days.      Passed - Valid encounter within last 6 months    Recent Outpatient Visits           2 weeks ago Encounter for annual physical exam   [facility] [place], Dionne Bucy, MD   6 months ago Essential (primary) hypertension   [facility] [place], Dionne Bucy, MD   1 year ago Encounter for annual physical exam   [facility], Dionne Bucy, MD   1 year ago Restless leg   [facility] [place], Dionne Bucy, MD   1 year ago Essential (primary) hypertension   [facility] Bacigal[place], Dionne Bucy, MD       Future Appointments             In 5 months Bacigalupo, Dionne Bucy, MD [facility] [place]

## 2019-08-29 DIAGNOSIS — Z79899 Other long term (current) drug therapy: Secondary | ICD-10-CM | POA: Diagnosis not present

## 2019-08-29 DIAGNOSIS — L4050 Arthropathic psoriasis, unspecified: Secondary | ICD-10-CM | POA: Diagnosis not present

## 2019-08-29 DIAGNOSIS — L409 Psoriasis, unspecified: Secondary | ICD-10-CM | POA: Diagnosis not present

## 2019-09-11 ENCOUNTER — Telehealth: Payer: Self-pay

## 2019-09-11 DIAGNOSIS — Z79899 Other long term (current) drug therapy: Secondary | ICD-10-CM

## 2019-09-11 NOTE — Telephone Encounter (Signed)
Copied from [place] [id number]. Topic: General - Inquiry >> Sep 11, 2019  4:38 PM Mathis Bud wrote: Reason for CRM: Patient states she needs labs rechecked. No lab order. Patient will like a call back when placed.  Call back [phone]

## 2019-09-12 NOTE — Telephone Encounter (Signed)
Labs added and patient notified. Patient states she will come tomorrow, 09/13/2019 to have labs drawn.

## 2019-09-12 NOTE — Telephone Encounter (Signed)
Patient states that she is wanting to know if Dr. Brita Romp could added on the labs that her Rheumatologist [facility]) is requesting to her labwork so that she would not have to go get labs drawn twice. Patient states that Dr. Meda Coffee is wanting hepatic function panel, cbc w/ differential/platelets due to long term high risk medication use. As she would like Dr. Brita Romp to know that she is going to be starting Folic Acid 1 mg daily & Methotrexate 2.5 mg (4 tabs once a week)Please advise.

## 2019-09-12 NOTE — Telephone Encounter (Signed)
Ok to add. Thanks for the update

## 2019-09-12 NOTE — Addendum Note (Signed)
Addended by: Casimer Leek C on: 09/12/2019 11:35 AM   Modules accepted: Orders

## 2019-09-12 NOTE — Addendum Note (Signed)
Addended by: Casimer Leek C on: 09/12/2019 11:49 AM   Modules accepted: Orders

## 2019-09-14 ENCOUNTER — Other Ambulatory Visit: Payer: Self-pay

## 2019-09-14 DIAGNOSIS — Z79899 Other long term (current) drug therapy: Secondary | ICD-10-CM | POA: Diagnosis not present

## 2019-09-14 NOTE — Progress Notes (Signed)
error 

## 2019-09-15 LAB — CBC WITH DIFFERENTIAL/PLATELET
Basophils Absolute: 0 10*3/uL (ref 0.0–0.2)
Basos: 1 %
EOS (ABSOLUTE): 0.1 10*3/uL (ref 0.0–0.4)
Eos: 2 %
Hematocrit: 46 % (ref 34.0–46.6)
Hemoglobin: 15.5 g/dL (ref 11.1–15.9)
Immature Grans (Abs): 0 10*3/uL (ref 0.0–0.1)
Immature Granulocytes: 0 %
Lymphocytes Absolute: 1.7 10*3/uL (ref 0.7–3.1)
Lymphs: 29 %
MCH: 29.9 pg (ref 26.6–33.0)
MCHC: 33.7 g/dL (ref 31.5–35.7)
MCV: 89 fL (ref 79–97)
Monocytes Absolute: 0.5 10*3/uL (ref 0.1–0.9)
Monocytes: 9 %
Neutrophils Absolute: 3.6 10*3/uL (ref 1.4–7.0)
Neutrophils: 59 %
Platelets: 172 10*3/uL (ref 150–450)
RBC: 5.18 x10E6/uL (ref 3.77–5.28)
RDW: 13.6 % (ref 11.7–15.4)
WBC: 6 10*3/uL (ref 3.4–10.8)

## 2019-09-15 LAB — COMPREHENSIVE METABOLIC PANEL
ALT: 11 IU/L (ref 0–32)
AST: 21 IU/L (ref 0–40)
Albumin/Globulin Ratio: 1.7 (ref 1.2–2.2)
Albumin: 3.9 g/dL (ref 3.7–4.7)
Alkaline Phosphatase: 151 IU/L — ABNORMAL HIGH (ref 39–117)
BUN/Creatinine Ratio: 5 — ABNORMAL LOW (ref 12–28)
BUN: 7 mg/dL — ABNORMAL LOW (ref 8–27)
Bilirubin Total: 0.7 mg/dL (ref 0.0–1.2)
CO2: 25 mmol/L (ref 20–29)
Calcium: 9.7 mg/dL (ref 8.7–10.3)
Chloride: 97 mmol/L (ref 96–106)
Creatinine, Ser: 1.28 mg/dL — ABNORMAL HIGH (ref 0.57–1.00)
GFR calc Af Amer: 47 mL/min/{1.73_m2} — ABNORMAL LOW (ref >59)
GFR calc non Af Amer: 41 mL/min/{1.73_m2} — ABNORMAL LOW (ref >59)
Globulin, Total: 2.3 g/dL (ref 1.5–4.5)
Glucose: 79 mg/dL (ref 65–99)
Potassium: 3.5 mmol/L (ref 3.5–5.2)
Sodium: 140 mmol/L (ref 134–144)
Total Protein: 6.2 g/dL (ref 6.0–8.5)

## 2019-09-15 LAB — HEPATIC FUNCTION PANEL: Bilirubin, Direct: 0.25 mg/dL (ref 0.00–0.40)

## 2019-09-15 LAB — CK: Total CK: 102 U/L (ref 32–182)

## 2019-09-19 ENCOUNTER — Telehealth: Payer: Self-pay

## 2019-09-19 NOTE — Telephone Encounter (Signed)
Patient advised as below. Patient reports that labs need to be faxed to Dr. Marlowe Sax at [facility]. [phone]. Faxed to [phone].

## 2019-09-19 NOTE — Telephone Encounter (Signed)
-----   Message from [name], MD sent at 09/19/2019  8:14 AM EST ----- Normal/stable labs.  I think she needed these labs for a specialist, so we may need to fax them over.  She should have the information about this.

## 2019-10-01 ENCOUNTER — Other Ambulatory Visit: Payer: Self-pay | Admitting: Family Medicine

## 2019-10-01 NOTE — Telephone Encounter (Signed)
Requested Prescriptions  Pending Prescriptions Disp Refills  . simvastatin (ZOCOR) 20 MG tablet [Pharmacy Med Name: SIMVASTATIN 20 MG TABLET] 90 tablet 3    Sig: TAKE 1 TABLET BY MOUTH EVERY DAY     Cardiovascular:  Antilipid - Statins Failed - 10/01/2019 12:55 AM      Failed - LDL in normal range and within 360 days    LDL Cholesterol (Calc)  Date Value Ref Range Status  07/15/2017 112 (H) mg/dL (calc) Final    Comment:    Reference range: <100 . Desirable range <100 mg/dL for primary prevention;   <70 mg/dL for patients with CHD or diabetic patients  with > or = 2 CHD risk factors. [name] LDL-C is now calculated using the Martin-Hopkins  calculation, which is a validated novel method providing  better accuracy than the Friedewald equation in the  estimation of LDL-C.  Cresenciano Genre et al. Annamaria Helling. [id number]): 2061-2068  (http://education.[employer].com/faq/FAQ164)    LDL Chol Calc (NIH)  Date Value Ref Range Status  08/08/2019 85 0 - 99 mg/dL Final         Passed - Total Cholesterol in normal range and within 360 days    Cholesterol, Total  Date Value Ref Range Status  08/08/2019 163 100 - 199 mg/dL Final         Passed - HDL in normal range and within 360 days    HDL  Date Value Ref Range Status  08/08/2019 53 >39 mg/dL Final         Passed - Triglycerides in normal range and within 360 days    Triglycerides  Date Value Ref Range Status  08/08/2019 146 0 - 149 mg/dL Final         Passed - Patient is not pregnant      Passed - Valid encounter within last 12 months    Recent Outpatient Visits          2 months ago Encounter for annual physical exam   [facility], Dionne Bucy, MD   8 months ago Essential (primary) hypertension   [facility], Dionne Bucy, MD   1 year ago Encounter for annual physical exam   [facility] [place], Dionne Bucy, MD   1 year ago Restless leg   [facility]  [place], Dionne Bucy, MD   1 year ago Essential (primary) hypertension   [place], Dionne Bucy, MD      Future Appointments            In 3 months Bacigalupo, Dionne Bucy, MD [facility] [place]

## 2019-10-02 ENCOUNTER — Other Ambulatory Visit: Payer: Self-pay | Admitting: Family Medicine

## 2019-10-02 NOTE — Telephone Encounter (Signed)
Requested Prescriptions  Pending Prescriptions Disp Refills  . rOPINIRole (REQUIP) 1 MG tablet [Pharmacy Med Name: ROPINIROLE HCL 1 MG TABLET] 90 tablet 0    Sig: TAKE 1 TABLET (1 MG TOTAL) BY MOUTH AT BEDTIME.     Neurology:  Parkinsonian Agents Passed - 10/02/2019  1:30 AM      Passed - Last BP in normal range    BP Readings from Last 1 Encounters:  07/24/19 123/83         Passed - Valid encounter within last 12 months    Recent Outpatient Visits          2 months ago Encounter for annual physical exam   [facility] [name], Dionne Bucy, MD   8 months ago Essential (primary) hypertension   [facility], Dionne Bucy, MD   1 year ago Encounter for annual physical exam   [facility], Dionne Bucy, MD   1 year ago Restless leg   [facility] [place], Dionne Bucy, MD   1 year ago Essential (primary) hypertension   [facility] [place], Dionne Bucy, MD      Future Appointments            In 3 months Bacigalupo, Dionne Bucy, MD [facility] [place], [facility]           . metoprolol succinate (TOPROL-XL) 50 MG 24 hr tablet [Pharmacy Med Name: METOPROLOL SUCC ER 50 MG TAB] 90 tablet 0    Sig: TAKE 1 TABLET (50 MG TOTAL) BY MOUTH DAILY. TAKE WITH OR IMMEDIATELY FOLLOWING A MEAL.     Cardiovascular:  Beta Blockers Passed - 10/02/2019  1:30 AM      Passed - Last BP in normal range    BP Readings from Last 1 Encounters:  07/24/19 123/83         Passed - Last Heart Rate in normal range    Pulse Readings from Last 1 Encounters:  07/24/19 71         Passed - Valid encounter within last 6 months    Recent Outpatient Visits          2 months ago Encounter for annual physical exam   [facility] [place], Dionne Bucy, MD   8 months ago Essential (primary) hypertension   [facility], Dionne Bucy, MD   1 year ago Encounter for annual physical exam   [facility], Dionne Bucy, MD   1 year ago Restless leg   [facility] [place], Dionne Bucy, MD   1 year ago Essential (primary) hypertension   [place], Dionne Bucy, MD      Future Appointments            In 3 months Bacigalupo, Dionne Bucy, MD [facility]           . hydrochlorothiazide (HYDRODIURIL) 12.5 MG tablet [Pharmacy Med Name: HYDROCHLOROTHIAZIDE 12.5 MG TB] 90 tablet 0    Sig: TAKE 1 TABLET BY MOUTH EVERY DAY     Cardiovascular: Diuretics - Thiazide Failed - 10/02/2019  1:30 AM      Failed - Cr in normal range and within 360 days    Creat  Date Value Ref Range Status  07/15/2017 0.85 0.60 - 0.93 mg/dL Final    Comment:    For patients >39 years of age, the reference limit for Creatinine is approximately 13% higher for people identified as African-American. [name]  Creatinine, Ser  Date Value Ref Range Status  09/14/2019 1.28 (H) 0.57 - 1.00 mg/dL Final         Passed - Ca in normal range and within 360 days    Calcium  Date Value Ref Range Status  09/14/2019 9.7 8.7 - 10.3 mg/dL Final  07/25/2018 9.7  Final         Passed - K in normal range and within 360 days    Potassium  Date Value Ref Range Status  09/14/2019 3.5 3.5 - 5.2 mmol/L Final         Passed - Na in normal range and within 360 days    Sodium  Date Value Ref Range Status  09/14/2019 140 134 - 144 mmol/L Final         Passed - Last BP in normal range    BP Readings from Last 1 Encounters:  07/24/19 123/83         Passed - Valid encounter within last 6 months    Recent Outpatient Visits          2 months ago Encounter for annual physical exam   [facility], Dionne Bucy, MD   8 months ago Essential (primary) hypertension   [facility], Dionne Bucy, MD   1 year ago Encounter for annual physical exam   [facility] [place], Dionne Bucy, MD   1 year ago  Restless leg   [facility] [place], Dionne Bucy, MD   1 year ago Essential (primary) hypertension   [place], Dionne Bucy, MD      Future Appointments            In 3 months Bacigalupo, Dionne Bucy, MD [facility], [facility]           . pantoprazole (PROTONIX) 20 MG tablet [Pharmacy Med Name: PANTOPRAZOLE SOD DR 20 MG TAB] 180 tablet 0    Sig: TAKE 1 TABLET (20 MG TOTAL) BY MOUTH 2 (TWO) TIMES DAILY BEFORE A MEAL.     Gastroenterology: Proton Pump Inhibitors Passed - 10/02/2019  1:30 AM      Passed - Valid encounter within last 12 months    Recent Outpatient Visits          2 months ago Encounter for annual physical exam   [facility] [place], Dionne Bucy, MD   8 months ago Essential (primary) hypertension   [facility], Dionne Bucy, MD   1 year ago Encounter for annual physical exam   [facility], Dionne Bucy, MD   1 year ago Restless leg   [facility] [place], Dionne Bucy, MD   1 year ago Essential (primary) hypertension   [facility] [place], Dionne Bucy, MD      Future Appointments            In 3 months Bacigalupo, Dionne Bucy, MD [facility] [address], [place]

## 2019-10-23 ENCOUNTER — Ambulatory Visit
Admission: RE | Admit: 2019-10-23 | Discharge: 2019-10-23 | Disposition: A | Discharge status: Home / Self Care | Payer: PPO | Source: Ambulatory Visit | Attending: Family Medicine | Admitting: Family Medicine

## 2019-10-23 DIAGNOSIS — Z1231 Encounter for screening mammogram for malignant neoplasm of breast: Secondary | ICD-10-CM

## 2019-10-23 DIAGNOSIS — Z0000 Encounter for general adult medical examination without abnormal findings: Secondary | ICD-10-CM

## 2019-10-24 ENCOUNTER — Telehealth: Payer: Self-pay

## 2019-10-24 NOTE — Telephone Encounter (Signed)
LMTCB 10/24/2019.  [facility] please advise pt of mammogram results below.    Thanks,   -[name]

## 2019-10-24 NOTE — Telephone Encounter (Signed)
-----   Message from [name], MD sent at 10/23/2019  2:54 PM EDT ----- Normal mammogram. Repeat in 1 yr

## 2019-10-27 NOTE — Telephone Encounter (Signed)
Left detailed message for patient as per DPR.

## 2019-11-06 ENCOUNTER — Other Ambulatory Visit: Payer: Self-pay | Admitting: Family Medicine

## 2019-11-06 NOTE — Telephone Encounter (Signed)
Requested medication (s) are due for refill today: yes  Requested medication (s) are on the active medication list: yes  Last refill:  08/03/19  Future visit scheduled: yes  Notes to clinic:  insurance requests alternative medication    Requested Prescriptions  Pending Prescriptions Disp Refills   gabapentin (NEURONTIN) 300 MG capsule [Pharmacy Med Name: GABAPENTIN 300 MG CAPSULE] 180 capsule 1    Sig: TAKE 1 [place]      Neurology: Anticonvulsants - gabapentin Passed - 11/06/2019  9:16 AM      Passed - Valid encounter within last 12 months    Recent Outpatient Visits           3 months ago Encounter for annual physical exam   [facility], Dionne Bucy, MD   9 months ago Essential (primary) hypertension   [facility], Dionne Bucy, MD   1 year ago Encounter for annual physical exam   [facility] [place], Dionne Bucy, MD   1 year ago Restless leg   [facility] [place], Dionne Bucy, MD   1 year ago Essential (primary) hypertension   [facility] [place], Dionne Bucy, MD       Future Appointments             In 2 months Bacigalupo, Dionne Bucy, MD [facility], [place]

## 2019-11-27 DIAGNOSIS — L409 Psoriasis, unspecified: Secondary | ICD-10-CM | POA: Diagnosis not present

## 2019-11-27 DIAGNOSIS — Z79899 Other long term (current) drug therapy: Secondary | ICD-10-CM | POA: Diagnosis not present

## 2019-11-27 DIAGNOSIS — L4050 Arthropathic psoriasis, unspecified: Secondary | ICD-10-CM | POA: Diagnosis not present

## 2019-12-03 ENCOUNTER — Other Ambulatory Visit: Payer: Self-pay | Admitting: Family Medicine

## 2019-12-03 ENCOUNTER — Other Ambulatory Visit: Payer: Self-pay | Admitting: Dermatology

## 2019-12-03 NOTE — Telephone Encounter (Signed)
Requested Prescriptions  Pending Prescriptions Disp Refills  . clobetasol (TEMOVATE) 0.05 % external solution [Pharmacy Med Name: CLOBETASOL 0.05% SOLUTION] 50 mL 3    Sig: APPLY TO AFFECTED AREA TWICE A DAY     Dermatology:  Corticosteroids Passed - 12/03/2019  5:14 PM      Passed - Valid encounter within last 12 months    Recent Outpatient Visits          4 months ago Encounter for annual physical exam   [facility], Dionne Bucy, MD   10 months ago Essential (primary) hypertension   [facility], Dionne Bucy, MD   1 year ago Encounter for annual physical exam   [facility] [place], Dionne Bucy, MD   1 year ago Restless leg   [facility] [place], Dionne Bucy, MD   1 year ago Essential (primary) hypertension   [facility] [place], Dionne Bucy, MD      Future Appointments            In 1 month Bacigalupo, Dionne Bucy, MD [facility], [place]

## 2019-12-03 NOTE — Telephone Encounter (Signed)
Requested Prescriptions  Pending Prescriptions Disp Refills  . levocetirizine (XYZAL) 5 MG tablet [Pharmacy Med Name: LEVOCETIRIZINE 5 MG TABLET] 90 tablet 4    Sig: TAKE 1 TABLET BY MOUTH EVERY DAY IN THE EVENING     Ear, Nose, and Throat:  Antihistamines Passed - 12/03/2019  5:43 PM      Passed - Valid encounter within last 12 months    Recent Outpatient Visits          4 months ago Encounter for annual physical exam   [facility], Dionne Bucy, MD   10 months ago Essential (primary) hypertension   [facility], Dionne Bucy, MD   1 year ago Encounter for annual physical exam   [facility] [place], Dionne Bucy, MD   1 year ago Restless leg   [facility] [place], Dionne Bucy, MD   1 year ago Essential (primary) hypertension   [facility] [place], Dionne Bucy, MD      Future Appointments            In 1 month Bacigalupo, Dionne Bucy, MD [facility], [place]

## 2019-12-28 ENCOUNTER — Other Ambulatory Visit: Payer: Self-pay | Admitting: Family Medicine

## 2019-12-31 ENCOUNTER — Other Ambulatory Visit: Payer: Self-pay | Admitting: Family Medicine

## 2019-12-31 ENCOUNTER — Other Ambulatory Visit: Payer: Self-pay | Admitting: Dermatology

## 2019-12-31 DIAGNOSIS — M1990 Unspecified osteoarthritis, unspecified site: Secondary | ICD-10-CM

## 2019-12-31 DIAGNOSIS — L409 Psoriasis, unspecified: Secondary | ICD-10-CM

## 2019-12-31 NOTE — Telephone Encounter (Signed)
Requested medication (s) are due for refill today: yes  Requested medication (s) are on the active medication list:yes  Last refill:  11/06/2019  Future visit scheduled: yes  Notes to clinic:  this refill cannot be delegated    Requested Prescriptions  Pending Prescriptions Disp Refills   traMADol (ULTRAM) 50 MG tablet [Pharmacy Med Name: TRAMADOL HCL 50 MG TABLET] 100 tablet 2    Sig: TAKE 1 TABLET BY MOUTH EVERY 6 HOURS AS NEEDED FOR MODERATE OR SEVERE PAIN      Not Delegated - Analgesics:  Opioid Agonists Failed - 12/31/2019  6:14 PM      Failed - This refill cannot be delegated      Failed - Urine Drug Screen completed in last 360 days.      Passed - Valid encounter within last 6 months    Recent Outpatient Visits           5 months ago Encounter for annual physical exam   [facility] [place], Dionne Bucy, MD   11 months ago Essential (primary) hypertension   [facility] [place], Dionne Bucy, MD   1 year ago Encounter for annual physical exam   [facility], Dionne Bucy, MD   1 year ago Restless leg   [facility] [place], Dionne Bucy, MD   1 year ago Essential (primary) hypertension   [facility] [place], Dionne Bucy, MD       Future Appointments             In 3 weeks Bacigalupo, Dionne Bucy, MD [facility], [place]

## 2020-01-24 ENCOUNTER — Ambulatory Visit: Payer: PPO | Admitting: Family Medicine

## 2020-02-27 DIAGNOSIS — L409 Psoriasis, unspecified: Secondary | ICD-10-CM | POA: Diagnosis not present

## 2020-02-27 DIAGNOSIS — L4050 Arthropathic psoriasis, unspecified: Secondary | ICD-10-CM | POA: Diagnosis not present

## 2020-02-27 DIAGNOSIS — Z79899 Other long term (current) drug therapy: Secondary | ICD-10-CM | POA: Diagnosis not present

## 2020-03-01 ENCOUNTER — Ambulatory Visit (INDEPENDENT_AMBULATORY_CARE_PROVIDER_SITE_OTHER): Payer: PPO | Admitting: Family Medicine

## 2020-03-01 ENCOUNTER — Encounter: Payer: Self-pay | Admitting: Family Medicine

## 2020-03-01 ENCOUNTER — Ambulatory Visit
Admission: RE | Admit: 2020-03-01 | Discharge: 2020-03-01 | Disposition: A | Discharge status: Home / Self Care | Payer: PPO | Source: Ambulatory Visit | Attending: Family Medicine | Admitting: Family Medicine

## 2020-03-01 ENCOUNTER — Other Ambulatory Visit: Payer: Self-pay

## 2020-03-01 ENCOUNTER — Ambulatory Visit
Admission: RE | Admit: 2020-03-01 | Discharge: 2020-03-01 | Disposition: A | Discharge status: Home / Self Care | Payer: PPO | Attending: Family Medicine | Admitting: Family Medicine

## 2020-03-01 VITALS — BP 110/57 | HR 98 | Temp 98.3°F | Resp 16 | Wt 121.6 lb

## 2020-03-01 DIAGNOSIS — W101XXA Fall (on)(from) sidewalk curb, initial encounter: Secondary | ICD-10-CM

## 2020-03-01 DIAGNOSIS — R634 Abnormal weight loss: Secondary | ICD-10-CM | POA: Diagnosis not present

## 2020-03-01 DIAGNOSIS — R413 Other amnesia: Secondary | ICD-10-CM

## 2020-03-01 DIAGNOSIS — Z96641 Presence of right artificial hip joint: Secondary | ICD-10-CM | POA: Insufficient documentation

## 2020-03-01 DIAGNOSIS — S7001XA Contusion of right hip, initial encounter: Secondary | ICD-10-CM | POA: Diagnosis not present

## 2020-03-01 DIAGNOSIS — M25551 Pain in right hip: Secondary | ICD-10-CM

## 2020-03-01 DIAGNOSIS — F0280 Dementia in other diseases classified elsewhere without behavioral disturbance: Secondary | ICD-10-CM | POA: Insufficient documentation

## 2020-03-01 NOTE — Assessment & Plan Note (Addendum)
New problem Given how quickly came on, though she is asymptomatic, we will get UA to assess for possible UTI She only scored a 19 out of 30 on her MMSE today, but we do not have a baseline MMSE to compare to She is defensive about her memory, which is understandable We will refer to neurology for further evaluation She did not hit her head during her fall, so we do not need to CT scan at this time, but she may need MRI in the future for evaluation of her memory loss Advised granddaughter on safety precautions She will check in on her and make sure she is taking her medications and eating If she has any further acute changes in her memory or behavior, consider ER for urgent evaluation for encephalopathy/delirium Advised granddaughter to not let her drive until she can have a driving test with the [facility]

## 2020-03-01 NOTE — Assessment & Plan Note (Signed)
Patient has lost about 40 pounds over the last year At first she tells me that she was not dieting or exercising, but then she will say that she was trying to lose weight and she is happy with the weight loss She is defensive about the weight loss We discussed that there is a large differential for unintentional weight loss She denies any depression Concerned, as above, that she may be forgetting to eat and this may be part of her memory loss She is up-to-date on her cancer screenings, but we may need to consider imaging if this continues Her granddaughter will check in on her eating more She will keep a log of what she is eating for me At follow-up in 1 month, we will reassess her weight It is possible this is early signs of failure to thrive in the geriatric cascade

## 2020-03-01 NOTE — Assessment & Plan Note (Signed)
Longstanding issue Upcoming appointment with Dr. Marry Guan I am concerned that she has a large bruise on her hip and does not know where she got this I am concerned that she may be falling more than we know Will obtain x-rays today to evaluate further

## 2020-03-01 NOTE — Progress Notes (Signed)
Established patient visit   Patient: Mariah [name]   DOB: August 06, 1945   74 y.o. Female  MRN: [id number] Visit Date: 03/01/2020  Today's healthcare provider: Lavon Paganini, MD  Cameron Ali as a scribe for Lavon Paganini, MD.,have documented all relevant documentation on the behalf of Lavon Paganini, MD,as directed by  Lavon Paganini, MD while in the presence of Lavon Paganini, MD. Chief Complaint  Patient presents with   Memory Loss   Subjective    Fall The accident occurred 5 to 7 days ago. The fall occurred while walking. She fell from a height of 1 to 2 ft. She landed on concrete. There was no blood loss. The point of impact was the right hip. The pain is present in the right hip. Pertinent negatives include no abdominal pain, bowel incontinence, fever, headaches, hearing loss, hematuria, loss of consciousness, nausea, numbness, tingling, visual change or vomiting. She has tried nothing for the symptoms.  Patient is accompanied with her granddaughter today who reports that on 02/27/20 patient had tripped up curb towards [employer]. Patients granddaughter reports that patient did fall  and hit the ground. Patient reports that she has an appointment with Orthopedic Dr. Marry Guan on 03/07/20 to follow up for right hip pain.   Her granddaughter and other family members have concerns about her memory.  It seems that within the last few weeks her memory has taken a sudden drop.  She seems to be forgetting to eat and there is food that is going back in her fridge.  They are concerned about her safety while driving.  She is becoming confused about what appointments she has when, which she has never done before.  She is living alone and is currently independent in her ADLs.  She has a large bruise on her lateral right hip that she does not know when she got.  It is not from the fall earlier this week.  She does not remember any previous falls.  She denies any UTI  symptoms or medication changes.  MMSE - Mini Mental State Exam 03/01/2020  Orientation to time 1  Orientation to Place 3  Registration 1  Attention/ Calculation 5  Recall 1  Language- name 2 objects 2  Language- repeat 1  Language- follow 3 step command 2  Language- read & follow direction 1  Write a sentence 1  Copy design 1  Total score 19   Patient Active Problem List   Diagnosis Date Noted   Unintentional weight loss 03/01/2020   Fall (on)(from) sidewalk curb, initial encounter 03/01/2020   Memory loss 03/01/2020   Allergic rhinitis 01/10/2018   Cholelithiasis 03/16/2016   Peripheral arterial disease ([place]) 02/05/2016   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   Abdominal aortic aneurysm (AAA) ([place]) 01/13/2016   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   Nerve root inflammation 10/25/2015   Psoriatic arthritis ([place]) 10/07/2015   Degenerative arthritis of hip 10/07/2015   Right hip pain 08/12/2015   Arthralgia of multiple joints 08/12/2015   Age related osteoporosis 08/12/2015   Chronic obstructive pulmonary disease ([place]) 06/24/2015   Senile purpura ([place]) 06/24/2015   Abnormal serum level of alkaline phosphatase 06/18/2015   Elevated hemoglobin (HCC) 06/18/2015   Fatigue 06/18/2015   Fistula 06/18/2015   Blood glucose elevated 06/18/2015   Gastro-esophageal reflux disease without esophagitis 06/18/2015   Restless leg 06/18/2015   Avitaminosis D 06/18/2015   Mechanical and motor problems with internal organs 10/11/2009   Hypercholesteremia 10/11/2009   Arthritis, degenerative 09/25/2009  Essential (primary) hypertension 09/25/2009   Psoriasis 09/25/2009   Compulsive tobacco user syndrome 09/25/2009   Past Medical History:  Diagnosis Date   Abdominal aortic aneurysm (AAA) (HCC)    Arthritis    COPD (chronic obstructive pulmonary disease) (HCC)    GERD (gastroesophageal reflux disease)    Headache    sinus   History of adenomatous polyp of  colon    Hyperlipidemia    Hypertension    Psoriasis    Right lumbar radiculitis    Vitamin D deficiency    Social History   Tobacco Use   Smoking status: Current Every Day Smoker    Packs/day: 1.00    Years: 0.00    Pack years: 0.00   Smokeless tobacco: Never Used  Vaping Use   Vaping Use: Former  Substance Use Topics   Alcohol use: Yes    Comment: occasionally glass of wine   Drug use: No   Allergies  Allergen Reactions   Clindamycin/Lincomycin     diarrhea   Amoxicillin Diarrhea       Medications: Outpatient Medications Prior to Visit  Medication Sig   albuterol (PROVENTIL HFA;VENTOLIN HFA) 108 (90 Base) MCG/ACT inhaler INHALE 1-2 PUFFS INTO THE LUNGS EVERY 6 HOURS AS NEEDED FOR WHEEZING OR SHORTNESS OF BREATH.   aspirin 81 MG EC tablet Take 81 mg by mouth daily. TAKES THE ENTERIC COATED IN THE MORNING AND THE CHEWABLE ASPIRIN IN THE EVENING   betamethasone dipropionate (DIPROLENE) 0.05 % ointment Apply topically daily.   calcipotriene (DOVONOX) 0.005 % cream APPLY TO SKIN ONCE A DAY APPLY DAILY ON HANDS, FEET AND WRIST X 2 WEEKS, THEN USE 5 DAYS A WEEK   Calcipotriene 0.005 % solution PLEASE SEE ATTACHED FOR DETAILED DIRECTIONS   Cholecalciferol (VITAMIN D3) 2000 units TABS Take 2,000 Units by mouth daily.   Cholestyramine POWD Take 1 Package by mouth as needed.   clobetasol (TEMOVATE) 0.05 % external solution APPLY TO AFFECTED AREA TWICE A DAY   Clobetasol Propionate 0.05 % shampoo SHAMPOO WITH SMALL AMOUNT TO SKIN ONCE A DAY,SHAMPOO SCALP ONCE DAILY FOR 2 WEEKS THEN 5 DAYS A WEEK   COSENTYX SENSOREADY, 300 MG, 150 MG/ML SOAJ Inject 2 Syringes into the skin every 28 (twenty-eight) days.   diphenhydrAMINE (BENADRYL) 25 MG tablet Take 25 mg by mouth at bedtime as needed.   fluticasone (FLONASE) 50 MCG/ACT nasal spray Place 2 sprays into both nostrils daily.   folic acid (FOLVITE) 1 MG tablet Take 1 mg by mouth daily.   gabapentin (NEURONTIN)  300 MG capsule TAKE 1 CAPSULE BY MOUTH TWICE A DAY   hydrochlorothiazide (HYDRODIURIL) 12.5 MG tablet TAKE 1 TABLET BY MOUTH EVERY DAY   levocetirizine (XYZAL) 5 MG tablet TAKE 1 TABLET BY MOUTH EVERY DAY IN THE EVENING   metoprolol succinate (TOPROL-XL) 50 MG 24 hr tablet TAKE 1 TABLET (50 MG TOTAL) BY MOUTH DAILY. TAKE WITH OR IMMEDIATELY FOLLOWING A MEAL.   pantoprazole (PROTONIX) 20 MG tablet TAKE 1 TABLET (20 MG TOTAL) BY MOUTH 2 (TWO) TIMES DAILY BEFORE A MEAL.   polyethylene glycol powder (GLYCOLAX/MIRALAX) powder Take 17 g by mouth 2 (two) times daily as needed.   rOPINIRole (REQUIP) 1 MG tablet TAKE 1 TABLET (1 MG TOTAL) BY MOUTH AT BEDTIME.   Secukinumab (COSENTYX SENSOREADY PEN) 150 MG/ML SOAJ Inject into the skin.   simvastatin (ZOCOR) 20 MG tablet TAKE 1 TABLET BY MOUTH EVERY DAY   traMADol (ULTRAM) 50 MG tablet TAKE 1 TABLET BY MOUTH  EVERY 6 HOURS AS NEEDED FOR MODERATE OR SEVERE PAIN   No facility-administered medications prior to visit.    Review of Systems  Constitutional: Negative for fever.  Gastrointestinal: Negative for abdominal pain, bowel incontinence, nausea and vomiting.  Genitourinary: Negative for hematuria.  Neurological: Negative for tingling, loss of consciousness, numbness and headaches.       Objective    BP (!) 110/57    Pulse 98    Temp 98.3 F (36.8 C) (Oral)    Resp 16    Wt 121 lb 9.6 oz (55.2 kg)    BMI 22.60 kg/m     Physical Exam Vitals reviewed. Exam conducted with a chaperone present.  Constitutional:      General: She is not in acute distress.    Appearance: Normal appearance. She is not diaphoretic.  HENT:     Head: Normocephalic and atraumatic.  Eyes:     Conjunctiva/sclera: Conjunctivae normal.  Cardiovascular:     Rate and Rhythm: Normal rate and regular rhythm.     Heart sounds: Murmur heard.   Pulmonary:     Effort: Pulmonary effort is normal. No respiratory distress.     Breath sounds: Normal breath sounds. No  wheezing.  Abdominal:     General: Abdomen is flat. There is no distension.     Tenderness: There is no abdominal tenderness.  Musculoskeletal:        General: Signs of injury present.     Right lower leg: No edema.     Left lower leg: No edema.     Comments: Large ecchymosis on lateral right hip.  Denies any tenderness to palpation.  Skin:    Findings: No rash.  Neurological:     General: No focal deficit present.     Mental Status: She is alert.     Gait: Gait normal.     Comments: Not at baseline Answers questions appropriately  Psychiatric:        Attention and Perception: Attention normal.        Mood and Affect: Mood normal. Affect is blunt.        Speech: Speech normal.        Behavior: Behavior is agitated.        Cognition and Memory: Memory is impaired.      No results found for any visits on 03/01/20.  Assessment & Plan     Problem List Items Addressed This Visit      Other   Right hip pain    Longstanding issue Upcoming appointment with Dr. Marry Guan I am concerned that she has a large bruise on her hip and does not know where she got this I am concerned that she may be falling more than we know Will obtain x-rays today to evaluate further      Unintentional weight loss    Patient has lost about 40 pounds over the last year At first she tells me that she was not dieting or exercising, but then she will say that she was trying to lose weight and she is happy with the weight loss She is defensive about the weight loss We discussed that there is a large differential for unintentional weight loss She denies any depression Concerned, as above, that she may be forgetting to eat and this may be part of her memory loss She is up-to-date on her cancer screenings, but we may need to consider imaging if this continues Her granddaughter will check in on her eating more  She will keep a log of what she is eating for me At follow-up in 1 month, we will reassess her  weight It is possible this is early signs of failure to thrive in the geriatric cascade      Fall (on)(from) sidewalk curb, initial encounter    Denies any injuries, loss of consciousness or hitting her head I am concerned that she has a large bruise on her hip and does not know where she got this from I am concerned she may be falling more than we know Discussed safety precautions with her granddaughter Consider physical therapy Memory evaluation as below      Relevant Orders   DG Hip Unilat W OR W/O Pelvis 2-3 Views Right   Memory loss - Primary    New problem Given how quickly came on, though she is asymptomatic, we will get UA to assess for possible UTI She only scored a 19 out of 30 on her MMSE today, but we do not have a baseline MMSE to compare to She is defensive about her memory, which is understandable We will refer to neurology for further evaluation She did not hit her head during her fall, so we do not need to CT scan at this time, but she may need MRI in the future for evaluation of her memory loss Advised granddaughter on safety precautions She will check in on her and make sure she is taking her medications and eating If she has any further acute changes in her memory or behavior, consider ER for urgent evaluation for encephalopathy/delirium Advised granddaughter to not let her drive until she can have a driving test with the [facility]      Relevant Orders   Ambulatory referral to Neurology       Total time spent on today's visit was greater than 40 minutes, including both face-to-face time and nonface-to-face time personally spent on review of chart (labs and imaging), discussing labs and goals, discussing further work-up, treatment options, referrals to specialist if needed, reviewing outside records of pertinent, answering patient's questions, and coordinating care.   Return in about 4 weeks (around 03/29/2020) for as scheduled.      I, Lavon Paganini, MD, have  reviewed all documentation for this visit. The documentation on 03/01/20 for the exam, diagnosis, procedures, and orders are all accurate and complete.   Dquan Cortopassi, Dionne Bucy, MD, MPH [facility]

## 2020-03-01 NOTE — Assessment & Plan Note (Signed)
Denies any injuries, loss of consciousness or hitting her head I am concerned that she has a large bruise on her hip and does not know where she got this from I am concerned she may be falling more than we know Discussed safety precautions with her granddaughter Consider physical therapy Memory evaluation as below

## 2020-03-04 ENCOUNTER — Telehealth: Payer: Self-pay

## 2020-03-04 ENCOUNTER — Other Ambulatory Visit (INDEPENDENT_AMBULATORY_CARE_PROVIDER_SITE_OTHER): Payer: PPO

## 2020-03-04 DIAGNOSIS — R413 Other amnesia: Secondary | ICD-10-CM | POA: Diagnosis not present

## 2020-03-04 LAB — POCT URINALYSIS DIPSTICK
Bilirubin, UA: NEGATIVE
Blood, UA: NEGATIVE
Glucose, UA: NEGATIVE
Ketones, UA: NEGATIVE
Leukocytes, UA: NEGATIVE
Nitrite, UA: NEGATIVE
Protein, UA: NEGATIVE
Spec Grav, UA: 1.01 (ref 1.010–1.025)
Urobilinogen, UA: 0.2 E.U./dL
pH, UA: 6.5 (ref 5.0–8.0)

## 2020-03-04 NOTE — Telephone Encounter (Signed)
-----   Message from [name], MD sent at 03/04/2020  8:22 AM EDT ----- No acute issues on XRay

## 2020-03-04 NOTE — Telephone Encounter (Signed)
Tried calling pt's mailbox is full.  PEC please advise pt of X-ray results if she calls back.   Thanks,   -[name]

## 2020-03-04 NOTE — Telephone Encounter (Signed)
Attempted to reach patient, VM full.

## 2020-03-05 NOTE — Telephone Encounter (Signed)
Attempted to call pt.  No answer; unable to leave message due to mailbox full.

## 2020-03-05 NOTE — Telephone Encounter (Signed)
Patient advised as below.  

## 2020-03-07 DIAGNOSIS — M25551 Pain in right hip: Secondary | ICD-10-CM | POA: Diagnosis not present

## 2020-03-07 DIAGNOSIS — Z96641 Presence of right artificial hip joint: Secondary | ICD-10-CM | POA: Diagnosis not present

## 2020-03-19 ENCOUNTER — Encounter: Payer: Self-pay | Admitting: Physician Assistant

## 2020-03-19 ENCOUNTER — Other Ambulatory Visit: Payer: Self-pay

## 2020-03-19 ENCOUNTER — Ambulatory Visit
Admission: RE | Admit: 2020-03-19 | Discharge: 2020-03-19 | Disposition: A | Discharge status: Home / Self Care | Payer: PPO | Source: Ambulatory Visit | Attending: Physician Assistant | Admitting: Physician Assistant

## 2020-03-19 ENCOUNTER — Ambulatory Visit (INDEPENDENT_AMBULATORY_CARE_PROVIDER_SITE_OTHER): Payer: PPO | Admitting: Physician Assistant

## 2020-03-19 ENCOUNTER — Ambulatory Visit: Payer: Self-pay | Admitting: *Deleted

## 2020-03-19 VITALS — BP 124/89 | HR 71 | Temp 99.0°F | Wt 122.0 lb

## 2020-03-19 DIAGNOSIS — R600 Localized edema: Secondary | ICD-10-CM | POA: Diagnosis not present

## 2020-03-19 DIAGNOSIS — I82442 Acute embolism and thrombosis of left tibial vein: Secondary | ICD-10-CM

## 2020-03-19 MED ORDER — RIVAROXABAN (XARELTO) VTE STARTER PACK (15 & 20 MG): ORAL_TABLET | ORAL | 0 refills | Status: DC

## 2020-03-19 NOTE — Progress Notes (Signed)
Acute Office Visit  Subjective:    Patient ID: Mariah [name], female    DOB: 29-May-1946, 74 y.o.   MRN: [id number]  Chief Complaint  Patient presents with  . Leg Swelling    HPI Patient is in today for lower extremity edema. She reports that she has had symptoms x 1 week. She denies any changes in her medications. She denies any injuries. She reports that her left leg is worse than the right. She reports she has not changed her activity. She did fall a couple of weeks ago [place] [employer] and landed on her left side. She reports her left leg is hurting. Swelling doesn't get better in the morning after sleeping. Denies chest pain, SOB.   Past Medical History:  Diagnosis Date  . Abdominal aortic aneurysm (AAA) ([place])   . Arthritis   . COPD (chronic obstructive pulmonary disease) ([place])   . GERD (gastroesophageal reflux disease)   . Headache    sinus  . History of adenomatous polyp of colon   . Hyperlipidemia   . Hypertension   . Psoriasis   . Right lumbar radiculitis   . Vitamin D deficiency     Past Surgical History:  Procedure Laterality Date  . APPENDECTOMY  2000  . BREAST BIOPSY Right 09/2016   radial scar;COMPLEX SCLEROSING LESION  . BREAST LUMPECTOMY Right 11/24/2016   COMPLEX SCLEROSING LESION  . BREAST LUMPECTOMY WITH NEEDLE LOCALIZATION Right 11/24/2016   Procedure: BREAST LUMPECTOMY WITH NEEDLE LOCALIZATION;  Surgeon: Leonie Green, MD;  Location: [facility] ORS;  Service: General;  Laterality: Right;  . COLONOSCOPY WITH PROPOFOL N/A 08/10/2016   Procedure: COLONOSCOPY WITH PROPOFOL;  Surgeon: Manya Silvas, MD;  Location[facility] ENDOSCOPY;  Service: Endoscopy;  Laterality: N/A;  . ESOPHAGOGASTRODUODENOSCOPY (EGD) WITH PROPOFOL N/A 08/10/2016   Procedure: ESOPHAGOGASTRODUODENOSCOPY (EGD) WITH PROPOFOL;  Surgeon: Manya Silvas, MD;  Location: [facility] ENDOSCOPY;  Service: Endoscopy;  Laterality: N/A;  . FOOT SURGERY Bilateral   . JOINT REPLACEMENT Right  01/27/2016   hip  . 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 Cancer Mother        kidney  . Arthritis Father   . Heart disease Father   . COPD Sister   . Fibromyalgia Sister   . Osteoarthritis Sister   . Osteoarthritis Maternal Aunt   . Colon cancer Paternal Aunt   . Colon cancer Paternal Uncle   . Colon cancer Paternal Grandmother   . Breast cancer Neg Hx     Social History   Socioeconomic History  . Marital status: Divorced    Spouse name: Not on file  . Number of children: 0  . Years of education: 5  . Highest education level: Associate degree: occupational, Hotel manager, or vocational program  Occupational History    Comment: retired  Tobacco Use  . Smoking status: Current Every Day Smoker    Packs/day: 1.00    Years: 0.00    Pack years: 0.00  . Smokeless tobacco: Never Used  Vaping Use  . Vaping Use: Former  Substance and Sexual Activity  . Alcohol use: Yes    Comment: occasionally glass of wine  . Drug use: No  . Sexual activity: Not on file  Other  Topics Concern  . Not on file  Social History Narrative  . Not on file   Social Determinants of Health   Financial Resource Strain: Medium Risk  . Difficulty of Paying Living Expenses: Somewhat hard  Food Insecurity: No Food Insecurity  . Worried About Charity fundraiser in the Last Year: Never true  . Ran Out of Food in the Last Year: Never true  Transportation Needs: No Transportation Needs  . Lack of Transportation (Medical): No  . Lack of Transportation (Non-Medical): No  Physical Activity: Inactive  . Days of Exercise per Week: 0 days  . Minutes of Exercise per Session: 0 min  Stress: No Stress Concern Present  . Feeling of Stress : Not at all  Social Connections: Moderately Integrated    . Frequency of Communication with Friends and Family: More than three times a week  . Frequency of Social Gatherings with Friends and Family: Twice a week  . Attends Religious Services: More than 4 times per year  . Active Member of Clubs or Organizations: Yes  . Attends Archivist Meetings: More than 4 times per year  . Marital Status: Divorced  Human resources officer Violence: Not At Risk  . Fear of Current or Ex-Partner: No  . Emotionally Abused: No  . Physically Abused: No  . Sexually Abused: No    Outpatient Medications Prior to Visit  Medication Sig Dispense Refill  . albuterol (PROVENTIL HFA;VENTOLIN HFA) 108 (90 Base) MCG/ACT inhaler INHALE 1-2 PUFFS INTO THE LUNGS EVERY 6 HOURS AS NEEDED FOR WHEEZING OR SHORTNESS OF BREATH. 8.5 Inhaler 5  . aspirin 81 MG EC tablet Take 81 mg by mouth daily. TAKES THE ENTERIC COATED IN THE MORNING AND THE CHEWABLE ASPIRIN IN THE EVENING    . betamethasone dipropionate (DIPROLENE) 0.05 % ointment Apply topically daily. 45 g 0  . calcipotriene (DOVONOX) 0.005 % cream APPLY TO SKIN ONCE A DAY APPLY DAILY ON HANDS, FEET AND WRIST X 2 WEEKS, THEN USE 5 DAYS A WEEK 60 g 0  . Calcipotriene 0.005 % solution PLEASE SEE ATTACHED FOR DETAILED DIRECTIONS    . Cholecalciferol (VITAMIN D3) 2000 units TABS Take 2,000 Units by mouth daily.    . Cholestyramine POWD Take 1 Package by mouth as needed. 60 g 5  . clobetasol (TEMOVATE) 0.05 % external solution APPLY TO AFFECTED AREA TWICE A DAY 50 mL 2  . Clobetasol Propionate 0.05 % shampoo SHAMPOO WITH SMALL AMOUNT TO SKIN ONCE A DAY,SHAMPOO SCALP ONCE DAILY FOR 2 WEEKS THEN 5 DAYS A WEEK 118 mL 0  . COSENTYX SENSOREADY, 300 MG, 150 MG/ML SOAJ Inject 2 Syringes into the skin every 28 (twenty-eight) days.    . diphenhydrAMINE (BENADRYL) 25 MG tablet Take 25 mg by mouth at bedtime as needed.    . fluticasone (FLONASE) 50 MCG/ACT nasal spray Place 2 sprays into both nostrils daily. 16 g 6  . folic acid (FOLVITE) 1  MG tablet Take 1 mg by mouth daily.    [name] gabapentin (NEURONTIN) 300 MG capsule TAKE 1 CAPSULE BY MOUTH TWICE A DAY 180 capsule 1  . hydrochlorothiazide (HYDRODIURIL) 12.5 MG tablet TAKE 1 TABLET BY MOUTH EVERY DAY 90 tablet 0  . levocetirizine (XYZAL) 5 MG tablet TAKE 1 TABLET BY MOUTH EVERY DAY IN THE EVENING 90 tablet 4  . metoprolol succinate (TOPROL-XL) 50 MG 24 hr tablet TAKE 1 TABLET (50 MG TOTAL) BY MOUTH DAILY. TAKE WITH OR IMMEDIATELY FOLLOWING A MEAL. 90 tablet 0  .  pantoprazole (PROTONIX) 20 MG tablet TAKE 1 TABLET (20 MG TOTAL) BY MOUTH 2 (TWO) TIMES DAILY BEFORE A MEAL. 180 tablet 1  . polyethylene glycol powder (GLYCOLAX/MIRALAX) powder Take 17 g by mouth 2 (two) times daily as needed. 3350 g 1  . rOPINIRole (REQUIP) 1 MG tablet TAKE 1 TABLET (1 MG TOTAL) BY MOUTH AT BEDTIME. 90 tablet 1  . Secukinumab (COSENTYX SENSOREADY PEN) 150 MG/ML SOAJ Inject into the skin.    [name] simvastatin (ZOCOR) 20 MG tablet TAKE 1 TABLET BY MOUTH EVERY DAY 90 tablet 3  . traMADol (ULTRAM) 50 MG tablet TAKE 1 TABLET BY MOUTH EVERY 6 HOURS AS NEEDED FOR MODERATE OR SEVERE PAIN 100 tablet 2   No facility-administered medications prior to visit.    Allergies  Allergen Reactions  . Clindamycin/Lincomycin     diarrhea  . Amoxicillin Diarrhea    Review of Systems  Constitutional: Negative.   Cardiovascular: Positive for leg swelling.  Musculoskeletal: Positive for arthralgias and myalgias.  Skin: Positive for color change.       Objective:    Physical Exam Cardiovascular:     Rate and Rhythm: Normal rate and regular rhythm.     Pulses: Normal pulses.     Heart sounds: Normal heart sounds.     Comments: Bilateral lower extremity pitting edema, left worse than right. Left leg has small erythematous patch on anterior aspect. There is some TTP of left leg.  Pulmonary:     Effort: Pulmonary effort is normal.     Breath sounds: Normal breath sounds.  Musculoskeletal:        General: Swelling  present.     Right lower leg: 2+ Edema present.     Left lower leg: 2+ Edema present.  Skin:    General: Skin is warm and dry.  Neurological:     Mental Status: She is alert and oriented to person, place, and time. Mental status is at baseline.  Psychiatric:        Mood and Affect: Mood normal.        Behavior: Behavior normal.     BP 124/89   Pulse 71   Temp 99 F (37.2 C)   Wt 122 lb (55.3 kg)   BMI 22.68 kg/m  Wt Readings from Last 3 Encounters:  03/19/20 122 lb (55.3 kg)  03/01/20 121 lb 9.6 oz (55.2 kg)  07/24/19 156 lb (70.8 kg)    Health Maintenance Due  Topic Date Due  . COVID-19 Vaccine (1) Never done  . INFLUENZA VACCINE  02/18/2020    There are no preventive care reminders to display for this patient.   Lab Results  Component Value Date   TSH 2.640 07/02/2015   Lab Results  Component Value Date   WBC 6.0 09/14/2019   HGB 15.5 09/14/2019   HCT 46.0 09/14/2019   MCV 89 09/14/2019   PLT 172 09/14/2019   Lab Results  Component Value Date   NA 140 09/14/2019   K 3.5 09/14/2019   CO2 25 09/14/2019   GLUCOSE 79 09/14/2019   BUN 7 (L) 09/14/2019   CREATININE 1.28 (H) 09/14/2019   BILITOT 0.7 09/14/2019   ALKPHOS 151 (H) 09/14/2019   AST 21 09/14/2019   ALT 11 09/14/2019   PROT 6.2 09/14/2019   ALBUMIN 3.9 09/14/2019   CALCIUM 9.7 09/14/2019   ANIONGAP 8 11/16/2016   Lab Results  Component Value Date   CHOL 163 08/08/2019   Lab Results  Component Value Date  HDL 53 08/08/2019   Lab Results  Component Value Date   LDLCALC 85 08/08/2019   Lab Results  Component Value Date   TRIG 146 08/08/2019   Lab Results  Component Value Date   CHOLHDL 3.1 08/08/2019   Lab Results  Component Value Date   HGBA1C 5.5 08/08/2019       Assessment & Plan:  1. Acute deep vein thrombosis (DVT) of tibial vein of left lower extremity ([place])  Workup as below however US revealed left lower DVT. Start on Xarelto and refer to vascular ad hem/onc.  Return precautions advised.   - RIVAROXABAN (XARELTO) VTE STARTER PACK (15 & 20 MG TABLETS); Follow package directions: Take one 15mg  tablet by mouth twice a day. On day 22, switch to one 20mg  tablet once a day. Take with food.  Dispense: 51 each; Refill: 0 - Ambulatory referral to Vascular Surgery - Ambulatory referral to Hematology  2. Bilateral leg edema  - Comprehensive Metabolic Panel (CMET) - CBC with Differential - B Nat Peptide - DG Chest 2 View; Future - US Venous Img Lower Unilateral Left (DVT); Future   Meds ordered this encounter  Medications  . RIVAROXABAN (XARELTO) VTE STARTER PACK (15 & 20 MG TABLETS)    Sig: Follow package directions: Take one 15mg  tablet by mouth twice a day. On day 22, switch to one 20mg  tablet once a day. Take with food.    Dispense:  51 each    Refill:  0    Order Specific Question:   Supervising Provider    Answer:   Birdie Sons [[id number]]     Trinna Post, PA-C

## 2020-03-19 NOTE — Telephone Encounter (Signed)
Patient and her niece are calling to report new swelling in legs and feet. Appointment scheduled for evaluation.  Reason for Disposition  [1] Thigh, calf, or ankle swelling AND [2] bilateral AND [3] 1 side is more swollen  Answer Assessment - Initial Assessment Questions 1. ONSET: "When did the swelling start?" (e.g., minutes, hours, days)     1 week ago 2. LOCATION: "What part of the leg is swollen?"  "Are both legs swollen or just one leg?"     Knee down- bilateral, R not as bad 3. SEVERITY: "How bad is the swelling?" (e.g., localized; mild, moderate, severe)  - Localized - small area of swelling localized to one leg  - MILD pedal edema - swelling limited to foot and ankle, pitting edema < 1/4 inch (6 mm) deep, rest and elevation eliminate most or all swelling  - MODERATE edema - swelling of lower leg to knee, pitting edema > 1/4 inch (6 mm) deep, rest and elevation only partially reduce swelling  - SEVERE edema - swelling extends above knee, facial or hand swelling present      moderate 4. REDNESS: "Does the swelling look red or infected?"     Red- tight- no fever 5. PAIN: "Is the swelling painful to touch?" If Yes, ask: "How painful is it?"   (Scale 1-10; mild, moderate or severe)     no 6. FEVER: "Do you have a fever?" If Yes, ask: "What is it, how was it measured, and when did it start?"      no 7. CAUSE: "What do you think is causing the leg swelling?"     unsure 8. MEDICAL HISTORY: "Do you have a history of heart failure, kidney disease, liver failure, or cancer?"     no 9. RECURRENT SYMPTOM: "Have you had leg swelling before?" If Yes, ask: "When was the last time?" "What happened that time?"     no 10. OTHER SYMPTOMS: "Do you have any other symptoms?" (e.g., chest pain, difficulty breathing)       no 11. PREGNANCY: "Is there any chance you are pregnant?" "When was your last menstrual period?"       n/a  Protocols used: LEG SWELLING AND EDEMA-A-AH

## 2020-03-19 NOTE — Patient Instructions (Signed)
Leg edema - get the chest xray across the street tomorrow and can come back for [facility] tomorrow as well.

## 2020-03-19 NOTE — Telephone Encounter (Signed)
FYI

## 2020-03-20 ENCOUNTER — Ambulatory Visit
Admission: RE | Admit: 2020-03-20 | Discharge: 2020-03-20 | Disposition: A | Discharge status: Home / Self Care | Payer: PPO | Source: Ambulatory Visit | Attending: Physician Assistant | Admitting: Physician Assistant

## 2020-03-20 ENCOUNTER — Ambulatory Visit
Admission: RE | Admit: 2020-03-20 | Discharge: 2020-03-20 | Disposition: A | Discharge status: Home / Self Care | Payer: PPO | Attending: Family Medicine | Admitting: Family Medicine

## 2020-03-20 ENCOUNTER — Other Ambulatory Visit: Payer: Self-pay

## 2020-03-20 DIAGNOSIS — R600 Localized edema: Secondary | ICD-10-CM | POA: Diagnosis not present

## 2020-03-20 DIAGNOSIS — M7989 Other specified soft tissue disorders: Secondary | ICD-10-CM | POA: Diagnosis not present

## 2020-03-20 DIAGNOSIS — I700 Atherosclerosis of aorta: Secondary | ICD-10-CM | POA: Diagnosis not present

## 2020-03-21 ENCOUNTER — Telehealth: Payer: Self-pay

## 2020-03-21 LAB — CBC WITH DIFFERENTIAL/PLATELET
Basophils Absolute: 0 10*3/uL (ref 0.0–0.2)
Basos: 0 %
EOS (ABSOLUTE): 0.1 10*3/uL (ref 0.0–0.4)
Eos: 2 %
Hematocrit: 37.4 % (ref 34.0–46.6)
Hemoglobin: 13.2 g/dL (ref 11.1–15.9)
Immature Grans (Abs): 0 10*3/uL (ref 0.0–0.1)
Immature Granulocytes: 0 %
Lymphocytes Absolute: 1.4 10*3/uL (ref 0.7–3.1)
Lymphs: 29 %
MCH: 33 pg (ref 26.6–33.0)
MCHC: 35.3 g/dL (ref 31.5–35.7)
MCV: 94 fL (ref 79–97)
Monocytes Absolute: 0.4 10*3/uL (ref 0.1–0.9)
Monocytes: 7 %
Neutrophils Absolute: 3 10*3/uL (ref 1.4–7.0)
Neutrophils: 62 %
Platelets: 249 10*3/uL (ref 150–450)
RBC: 4 x10E6/uL (ref 3.77–5.28)
RDW: 14 % (ref 11.7–15.4)
WBC: 4.9 10*3/uL (ref 3.4–10.8)

## 2020-03-21 LAB — BRAIN NATRIURETIC PEPTIDE: BNP: 352.1 pg/mL — ABNORMAL HIGH (ref 0.0–100.0)

## 2020-03-21 LAB — COMPREHENSIVE METABOLIC PANEL
ALT: 12 IU/L (ref 0–32)
AST: 15 IU/L (ref 0–40)
Albumin/Globulin Ratio: 1.5 (ref 1.2–2.2)
Albumin: 2.9 g/dL — ABNORMAL LOW (ref 3.7–4.7)
Alkaline Phosphatase: 240 IU/L — ABNORMAL HIGH (ref 48–121)
BUN/Creatinine Ratio: 8 — ABNORMAL LOW (ref 12–28)
BUN: 9 mg/dL (ref 8–27)
Bilirubin Total: 0.6 mg/dL (ref 0.0–1.2)
CO2: 29 mmol/L (ref 20–29)
Calcium: 8.3 mg/dL — ABNORMAL LOW (ref 8.7–10.3)
Chloride: 97 mmol/L (ref 96–106)
Creatinine, Ser: 1.19 mg/dL — ABNORMAL HIGH (ref 0.57–1.00)
GFR calc Af Amer: 52 mL/min/{1.73_m2} — ABNORMAL LOW (ref >59)
GFR calc non Af Amer: 45 mL/min/{1.73_m2} — ABNORMAL LOW (ref >59)
Globulin, Total: 2 g/dL (ref 1.5–4.5)
Glucose: 57 mg/dL — ABNORMAL LOW (ref 65–99)
Potassium: 3.1 mmol/L — ABNORMAL LOW (ref 3.5–5.2)
Sodium: 139 mmol/L (ref 134–144)
Total Protein: 4.9 g/dL — ABNORMAL LOW (ref 6.0–8.5)

## 2020-03-21 NOTE — Telephone Encounter (Signed)
Called patient and no answer or could not not leave message due to patient's voicemail being full. If patient calls back okay for [facility] to advise.

## 2020-03-21 NOTE — Telephone Encounter (Signed)
-----   Message from Trinna Post, [place] sent at 03/21/2020  3:40 PM EDT ----- CXR is normal other than chronic lung disease which is not related to her symptoms.

## 2020-03-22 NOTE — Telephone Encounter (Signed)
Patient called and was read CXR note by Adriana. Terrilee Croak PA.  She still has questions about test on her left leg. Will route note to office for follow up.

## 2020-03-26 DIAGNOSIS — M25551 Pain in right hip: Secondary | ICD-10-CM | POA: Diagnosis not present

## 2020-03-26 DIAGNOSIS — Z96641 Presence of right artificial hip joint: Secondary | ICD-10-CM | POA: Diagnosis not present

## 2020-03-26 NOTE — Telephone Encounter (Signed)
Patient called the office and was advised of results and advised that a referral was placed to hematology and vascular surgery.

## 2020-03-27 ENCOUNTER — Other Ambulatory Visit: Payer: Self-pay | Admitting: Family Medicine

## 2020-03-27 ENCOUNTER — Telehealth: Payer: Self-pay

## 2020-03-27 NOTE — Telephone Encounter (Signed)
-----   Message from Trinna Post, [place] sent at 03/26/2020 11:39 AM EDT ----- Her BNP is normal for her age. Her kidney function is lower but stable. Her albumin is a little low. She can follow up with her PCP about this. Also the vascular surgery referral was trying to contact her but voicemail is full. Hematology I don't see an update on. Can we check on these referrals?

## 2020-03-27 NOTE — Telephone Encounter (Signed)
Advised patient as below. Also advised that vascular was trying to get in touch with her. Verified number and it was correct. Will send a message to their office for them to try an contact again. She reports that she has an appt with hematology on [facility].

## 2020-04-01 DIAGNOSIS — R413 Other amnesia: Secondary | ICD-10-CM | POA: Diagnosis not present

## 2020-04-01 DIAGNOSIS — R410 Disorientation, unspecified: Secondary | ICD-10-CM | POA: Insufficient documentation

## 2020-04-02 ENCOUNTER — Inpatient Hospital Stay: Payer: PPO

## 2020-04-02 ENCOUNTER — Inpatient Hospital Stay: Payer: PPO | Attending: Internal Medicine | Admitting: Internal Medicine

## 2020-04-02 DIAGNOSIS — I2699 Other pulmonary embolism without acute cor pulmonale: Secondary | ICD-10-CM | POA: Insufficient documentation

## 2020-04-02 DIAGNOSIS — K219 Gastro-esophageal reflux disease without esophagitis: Secondary | ICD-10-CM | POA: Insufficient documentation

## 2020-04-02 DIAGNOSIS — Z800 Family history of malignant neoplasm of digestive organs: Secondary | ICD-10-CM | POA: Insufficient documentation

## 2020-04-02 DIAGNOSIS — E785 Hyperlipidemia, unspecified: Secondary | ICD-10-CM | POA: Insufficient documentation

## 2020-04-02 DIAGNOSIS — F17210 Nicotine dependence, cigarettes, uncomplicated: Secondary | ICD-10-CM | POA: Insufficient documentation

## 2020-04-02 DIAGNOSIS — I10 Essential (primary) hypertension: Secondary | ICD-10-CM | POA: Insufficient documentation

## 2020-04-02 DIAGNOSIS — Z8249 Family history of ischemic heart disease and other diseases of the circulatory system: Secondary | ICD-10-CM | POA: Insufficient documentation

## 2020-04-02 DIAGNOSIS — M1990 Unspecified osteoarthritis, unspecified site: Secondary | ICD-10-CM | POA: Insufficient documentation

## 2020-04-02 DIAGNOSIS — J449 Chronic obstructive pulmonary disease, unspecified: Secondary | ICD-10-CM | POA: Insufficient documentation

## 2020-04-02 DIAGNOSIS — Z7901 Long term (current) use of anticoagulants: Secondary | ICD-10-CM | POA: Insufficient documentation

## 2020-04-02 DIAGNOSIS — I82442 Acute embolism and thrombosis of left tibial vein: Secondary | ICD-10-CM | POA: Insufficient documentation

## 2020-04-02 DIAGNOSIS — Z79899 Other long term (current) drug therapy: Secondary | ICD-10-CM | POA: Insufficient documentation

## 2020-04-02 DIAGNOSIS — Z8051 Family history of malignant neoplasm of kidney: Secondary | ICD-10-CM | POA: Insufficient documentation

## 2020-04-04 ENCOUNTER — Ambulatory Visit (INDEPENDENT_AMBULATORY_CARE_PROVIDER_SITE_OTHER): Payer: PPO | Admitting: Family Medicine

## 2020-04-04 ENCOUNTER — Encounter: Payer: Self-pay | Admitting: Family Medicine

## 2020-04-04 ENCOUNTER — Other Ambulatory Visit: Payer: Self-pay

## 2020-04-04 VITALS — BP 75/55 | HR 74 | Temp 98.8°F | Resp 16 | Ht 61.0 in | Wt 118.0 lb

## 2020-04-04 DIAGNOSIS — I82442 Acute embolism and thrombosis of left tibial vein: Secondary | ICD-10-CM

## 2020-04-04 DIAGNOSIS — D692 Other nonthrombocytopenic purpura: Secondary | ICD-10-CM | POA: Diagnosis not present

## 2020-04-04 DIAGNOSIS — I10 Essential (primary) hypertension: Secondary | ICD-10-CM | POA: Diagnosis not present

## 2020-04-04 DIAGNOSIS — Z23 Encounter for immunization: Secondary | ICD-10-CM

## 2020-04-04 DIAGNOSIS — R634 Abnormal weight loss: Secondary | ICD-10-CM

## 2020-04-04 DIAGNOSIS — R413 Other amnesia: Secondary | ICD-10-CM | POA: Diagnosis not present

## 2020-04-04 MED ORDER — APIXABAN 5 MG PO TABS: 5.0000 mg | ORAL_TABLET | Freq: Two times a day (BID) | ORAL | 3 refills | Status: DC

## 2020-04-04 NOTE — Assessment & Plan Note (Addendum)
Worsening since starting Xarelto Patient to discontinue Aspirin 81 mg Change in anticoags as below

## 2020-04-04 NOTE — Assessment & Plan Note (Addendum)
Persistent Patient is up to date on cancer screening Has lost 4 lbs since last office visit Advised to eat more frequently  Try to at least maintain current weight Follow up in 1 month If continues, consider CT scans Will also be seeing Hematology as above for hypercoag work-up, so they may be able to evaluate as well

## 2020-04-04 NOTE — Progress Notes (Signed)
Established patient visit  I,Sulibeya S Dimas,acting as a scribe for Lavon Paganini, MD.,have documented all relevant documentation on the behalf of Lavon Paganini, MD,as directed by  Lavon Paganini, MD while in the presence of Lavon Paganini, MD.   Patient: Mariah [name]   DOB: 06-05-1946   74 y.o. Female  MRN: [id number] Visit Date: 04/04/2020  Today's healthcare provider: Lavon Paganini, MD   Chief Complaint  Patient presents with  . Hypertension  . Weight Loss  . Memory Loss   Subjective    HPI  Hypertension, follow-up  BP Readings from Last 3 Encounters:  04/04/20 (!) 75/55  03/19/20 124/89  03/01/20 (!) 110/57   Wt Readings from Last 3 Encounters:  04/04/20 118 lb (53.5 kg)  03/19/20 122 lb (55.3 kg)  03/01/20 121 lb 9.6 oz (55.2 kg)     She was last seen for hypertension 6 months ago.  BP at that visit was 123/83. Management since that visit includes no changes.  She reports excellent compliance with treatment. She is not having side effects.  She is following a Regular diet. She is not exercising. She does smoke.  Use of agents associated with hypertension: none.   Outside blood pressures are stable. Symptoms: No chest pain No chest pressure  No palpitations No syncope  No dyspnea No orthopnea  No paroxysmal nocturnal dyspnea Yes lower extremity edema   Pertinent labs: Lab Results  Component Value Date   CHOL 163 08/08/2019   HDL 53 08/08/2019   LDLCALC 85 08/08/2019   TRIG 146 08/08/2019   CHOLHDL 3.1 08/08/2019   Lab Results  Component Value Date   NA 139 03/20/2020   K 3.1 (L) 03/20/2020   CREATININE 1.19 (H) 03/20/2020   GFRNONAA 45 (L) 03/20/2020   GFRAA 52 (L) 03/20/2020   GLUCOSE 57 (L) 03/20/2020     The ASCVD Risk score (Goff DC Jr., et al., 2013) failed to calculate for the following reasons:   The valid systolic blood pressure range is 90 to 200 mmHg    ---------------------------------------------------------------------------------------------------  Follow up for weight loss  The patient was last seen for this 1 months ago. Changes made at last visit include no changes.  She reports excellent compliance with treatment. She feels that condition is Improved. She is not having side effects.   Wt Readings from Last 3 Encounters:  04/04/20 118 lb (53.5 kg)  03/19/20 122 lb (55.3 kg)  03/01/20 121 lb 9.6 oz (55.2 kg)    -----------------------------------------------------------------------------------------  Follow up for memory loss  The patient was last seen for this 1 months ago. Changes made at last visit include refer to neurology.  She reports good compliance with treatment. She feels that condition is Unchanged. She is not having side effects.  Patient was prescribed Aricept, patient has not started medication.  -----------------------------------------------------------------------------------------  Patient Active Problem List   Diagnosis Date Noted  . Acute deep vein thrombosis (DVT) of tibial vein of left lower extremity ([place]) 04/05/2020  . Unintentional weight loss 03/01/2020  . Fall (on)(from) sidewalk curb, initial encounter 03/01/2020  . Memory loss 03/01/2020  . Allergic rhinitis 01/10/2018  . Cholelithiasis 03/16/2016  . Peripheral arterial disease ([place]) 02/05/2016  . 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 Abdominal aortic aneurysm (AAA) ([place]) 01/13/2016  . 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 Nerve root inflammation 10/25/2015  . Psoriatic arthritis ([place]) 10/07/2015  . 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 Right hip pain 08/12/2015  . Arthralgia of  multiple joints 08/12/2015  . Age related osteoporosis 08/12/2015  . Chronic obstructive pulmonary disease ([place]) 06/24/2015  . Senile purpura ([place]) 06/24/2015  . Abnormal serum level of alkaline phosphatase 06/18/2015  . Elevated hemoglobin ([place])  06/18/2015  . Fatigue 06/18/2015  . Fistula 06/18/2015  . Blood glucose elevated 06/18/2015  . Gastro-esophageal reflux disease without esophagitis 06/18/2015  . Restless leg 06/18/2015  . Avitaminosis D 06/18/2015  . Mechanical and motor problems with internal organs 10/11/2009  . Hypercholesteremia 10/11/2009  . Arthritis, degenerative 09/25/2009  . Essential (primary) hypertension 09/25/2009  . Psoriasis 09/25/2009  . Compulsive tobacco user syndrome 09/25/2009   Social History   Tobacco Use  . Smoking status: Current Every Day Smoker    Packs/day: 1.00    Years: 0.00    Pack years: 0.00  . Smokeless tobacco: Never Used  Vaping Use  . Vaping Use: Former  Substance Use Topics  . Alcohol use: Yes    Comment: occasionally glass of wine  . Drug use: No   Allergies  Allergen Reactions  . Clindamycin/Lincomycin     diarrhea  . Amoxicillin Diarrhea       Medications: Outpatient Medications Prior to Visit  Medication Sig  . albuterol (PROVENTIL HFA;VENTOLIN HFA) 108 (90 Base) MCG/ACT inhaler INHALE 1-2 PUFFS INTO THE LUNGS EVERY 6 HOURS AS NEEDED FOR WHEEZING OR SHORTNESS OF BREATH.  . betamethasone dipropionate (DIPROLENE) 0.05 % ointment Apply topically daily.  . calcipotriene (DOVONOX) 0.005 % cream APPLY TO SKIN ONCE A DAY APPLY DAILY ON HANDS, FEET AND WRIST X 2 WEEKS, THEN USE 5 DAYS A WEEK  . Calcipotriene 0.005 % solution PLEASE SEE ATTACHED FOR DETAILED DIRECTIONS  . Cholecalciferol (VITAMIN D3) 2000 units TABS Take 2,000 Units by mouth daily.  . Cholestyramine POWD Take 1 Package by mouth as needed.  . clobetasol (TEMOVATE) 0.05 % external solution APPLY TO AFFECTED AREA TWICE A DAY  . Clobetasol Propionate 0.05 % shampoo SHAMPOO WITH SMALL AMOUNT TO SKIN ONCE A DAY,SHAMPOO SCALP ONCE DAILY FOR 2 WEEKS THEN 5 DAYS A WEEK  . COSENTYX SENSOREADY, 300 MG, 150 MG/ML SOAJ Inject 2 Syringes into the skin every 28 (twenty-eight) days.  . diphenhydrAMINE (BENADRYL) 25 MG  tablet Take 25 mg by mouth at bedtime as needed.  . donepezil (ARICEPT) 5 MG tablet Take 5 mg by mouth at bedtime.  . fluticasone (FLONASE) 50 MCG/ACT nasal spray Place 2 sprays into both nostrils daily.  . folic acid (FOLVITE) 1 MG tablet Take 1 mg by mouth daily.  [name] gabapentin (NEURONTIN) 300 MG capsule TAKE 1 CAPSULE BY MOUTH TWICE A DAY  . levocetirizine (XYZAL) 5 MG tablet TAKE 1 TABLET BY MOUTH EVERY DAY IN THE EVENING  . metoprolol succinate (TOPROL-XL) 50 MG 24 hr tablet TAKE 1 TABLET (50 MG TOTAL) BY MOUTH DAILY. TAKE WITH OR IMMEDIATELY FOLLOWING A MEAL.  . pantoprazole (PROTONIX) 20 MG tablet TAKE 1 TABLET (20 MG TOTAL) BY MOUTH 2 (TWO) TIMES DAILY BEFORE A MEAL.  [name] polyethylene glycol powder (GLYCOLAX/MIRALAX) powder Take 17 g by mouth 2 (two) times daily as needed.  [name] rOPINIRole (REQUIP) 1 MG tablet TAKE 1 TABLET (1 MG TOTAL) BY MOUTH AT BEDTIME.  . Secukinumab (COSENTYX SENSOREADY PEN) 150 MG/ML SOAJ Inject into the skin.  [name] simvastatin (ZOCOR) 20 MG tablet TAKE 1 TABLET BY MOUTH EVERY DAY  . traMADol (ULTRAM) 50 MG tablet TAKE 1 TABLET BY MOUTH EVERY 6 HOURS AS NEEDED FOR MODERATE OR SEVERE PAIN  . [  DISCONTINUED] aspirin 81 MG EC tablet Take 81 mg by mouth daily. TAKES THE ENTERIC COATED IN THE MORNING AND THE CHEWABLE ASPIRIN IN THE EVENING  . [DISCONTINUED] RIVAROXABAN (XARELTO) VTE STARTER PACK (15 & 20 MG TABLETS) Follow package directions: Take one 15mg  tablet by mouth twice a day. On day 22, switch to one 20mg  tablet once a day. Take with food.  . [DISCONTINUED] hydrochlorothiazide (HYDRODIURIL) 12.5 MG tablet TAKE 1 TABLET BY MOUTH EVERY DAY (Patient not taking: Reported on 04/04/2020)   No facility-administered medications prior to visit.    Review of Systems  Constitutional: Negative for appetite change and fatigue.  Respiratory: Negative for chest tightness and shortness of breath.   Cardiovascular: Positive for leg swelling. Negative for chest pain and palpitations.       Objective    BP (!) 75/55 (BP Location: Left Arm, Patient Position: Sitting, Cuff Size: Normal)   Pulse 74   Temp 98.8 F (37.1 C) (Oral)   Resp 16   Ht 5\' 1"  (1.549 m)   Wt 118 lb (53.5 kg)   BMI 22.30 kg/m    Physical Exam Constitutional:      General: She is not in acute distress.    Appearance: Normal appearance. She is not diaphoretic.  HENT:     Head: Normocephalic and atraumatic.  Eyes:     General: No scleral icterus.    Conjunctiva/sclera: Conjunctivae normal.  Cardiovascular:     Rate and Rhythm: Normal rate and regular rhythm.     Pulses: Normal pulses.     Heart sounds: Normal heart sounds. No murmur heard.   Pulmonary:     Effort: Pulmonary effort is normal. No respiratory distress.     Breath sounds: Normal breath sounds. No wheezing.  Abdominal:     General: There is no distension.     Palpations: Abdomen is soft.     Tenderness: There is no abdominal tenderness.  Musculoskeletal:     Cervical back: Neck supple.     Right lower leg: No edema.     Left lower leg: Edema present.  Lymphadenopathy:     Cervical: No cervical adenopathy.  Skin:    General: Skin is warm and dry.     Findings: No rash.  Neurological:     Mental Status: She is alert. Mental status is at baseline.  Psychiatric:        Mood and Affect: Mood normal.        Behavior: Behavior normal.      Results for orders placed or performed in visit on 04/04/20  Vitamin B12  Result Value Ref Range   Vitamin B-12 403 232 - 1,245 pg/mL  TSH  Result Value Ref Range   TSH 4.580 (H) 0.450 - 4.500 uIU/mL    Assessment & Plan     Problem List Items Addressed This Visit      Cardiovascular and Mediastinum   Essential (primary) hypertension    Low today, BP 75/55 Patient denies any symptoms Discontinue HCTZ 12.5 mg Continue to monitor blood pressure at home Follow up in 1 month       Relevant Medications   apixaban (ELIQUIS) 5 MG TABS tablet   Senile purpura ([facility])     Worsening since starting Xarelto Patient to discontinue Aspirin 81 mg Change in anticoags as below       Relevant Medications   apixaban (ELIQUIS) 5 MG TABS tablet   Acute deep vein thrombosis (DVT) of tibial vein of left lower  extremity ([place])    Diagnosed last month by Fabio Bering, PA Patient with persistent swelling - discussed elevation Given location of DVT, doubt she needs procedure by [facility] and she wishes to cancel this referral that was made Does not appear that hematology referral has been processed - will look into this Patient needs hypercoag work-up given unprovoked nature She does report significant family history of blood clots Given the DVT and the unintentional weight loss, concern for a possible underlying malignancy She is up-to-date on cancer screening May need CT scan of chest abdomen pelvis Given her borderline creatinine clearance, feel it would be best for her to be on Eliquis and not Xarelto, so we will switch to Eliquis 5 mg twice daily today Stop taking aspirin with anticoagulation Check CBC at next visit      Relevant Medications   apixaban (ELIQUIS) 5 MG TABS tablet     Other   Unintentional weight loss    Persistent Patient is up to date on cancer screening Has lost 4 lbs since last office visit Advised to eat more frequently  Try to at least maintain current weight Follow up in 1 month If continues, consider CT scans Will also be seeing Hematology as above for hypercoag work-up, so they may be able to evaluate as well       Relevant Orders   TSH (Completed)   Memory loss - Primary    Stable Prescribed Aricept by Dr. Melrose Nakayama Advised to start medication Will check B12 and TSH as advised by Dr. Melrose Nakayama Patient reports that on her fathers side the females have B12 deficiency, requiring B12 injections      Relevant Orders   Vitamin B12 (Completed)   TSH (Completed)    Other Visit Diagnoses    Need for influenza vaccination       Relevant Orders     Flu Vaccine QUAD High Dose(Fluad) (Completed)      Total time spent on today's visit was greater than 40 minutes, including both face-to-face time and nonface-to-face time personally spent on review of chart (labs and imaging), discussing labs and goals, discussing further work-up, treatment options, referrals to specialist if needed, reviewing outside records of pertinent, answering patient's questions, and coordinating care.     Return for chronic disease f/u.      I, Lavon Paganini, MD, have reviewed all documentation for this visit. The documentation on 04/05/20 for the exam, diagnosis, procedures, and orders are all accurate and complete.   Kaesen Rodriguez, Dionne Bucy, MD, MPH [facility]

## 2020-04-04 NOTE — Assessment & Plan Note (Signed)
Low today, BP 75/55 Patient denies any symptoms Discontinue HCTZ 12.5 mg Continue to monitor blood pressure at home Follow up in 1 month

## 2020-04-04 NOTE — Patient Instructions (Addendum)
Please stop taking hydrochlorothiazide 12.5 mg, Stop Aspirin 81 mg, and Stop Xarelto Please start taking Aricept 5 mg daily Start Eliquis 5mg  two times daily  Hypotension As your heart beats, it forces blood through your body. This force is called blood pressure. If you have hypotension, you have low blood pressure. When your blood pressure is too low, you may not get enough blood to your brain or other parts of your body. This may cause you to feel weak, light-headed, have a fast heartbeat, or even pass out (faint). Low blood pressure may be harmless, or it may cause serious problems. What are the causes?  Blood loss.  Not enough water in the body (dehydration).  Heart problems.  Hormone problems.  Pregnancy.  A very bad infection.  Not having enough of certain nutrients.  Very bad allergic reactions.  Certain medicines. What increases the risk?  Age. The risk increases as you get older.  Conditions that affect the heart or the brain and spinal cord (central nervous system).  Taking certain medicines.  Being pregnant. What are the signs or symptoms?  Feeling: ? Weak. ? Light-headed. ? Dizzy. ? Tired (fatigued).  Blurred vision.  Fast heartbeat.  Passing out, in very bad cases. How is this treated?  Changing your diet. This may involve eating more salt (sodium) or drinking more water.  Taking medicines to raise your blood pressure.  Changing how much you take (the dosage) of some of your medicines.  Wearing compression stockings. These stockings help to prevent blood clots and reduce swelling in your legs. In some cases, you may need to go to the hospital for:  Fluid replacement. This means you will receive fluids through an IV tube.  Blood replacement. This means you will receive donated blood through an IV tube (transfusion).  Treating an infection or heart problems, if this applies.  Monitoring. You may need to be monitored while medicines that you  are taking wear off. Follow these instructions at home: Eating and drinking   Drink enough fluids to keep your pee (urine) pale yellow.  Eat a healthy diet. Follow instructions from your doctor about what you can eat or drink. A healthy diet includes: ? Fresh fruits and vegetables. ? Whole grains. ? Low-fat (lean) meats. ? Low-fat dairy products.  Eat extra salt only as told. Do not add extra salt to your diet unless your doctor tells you to.  Eat small meals often.  Avoid standing up quickly after you eat. Medicines  Take over-the-counter and prescription medicines only as told by your doctor. ? Follow instructions from your doctor about changing how much you take of your medicines, if this applies. ? Do not stop or change any of your medicines on your own. General instructions   Wear compression stockings as told by your doctor.  Get up slowly from lying down or sitting.  Avoid hot showers and a lot of heat as told by your doctor.  Return to your normal activities as told by your doctor. Ask what activities are safe for you.  Do not use any products that contain nicotine or tobacco, such as cigarettes, e-cigarettes, and chewing tobacco. If you need help quitting, ask your doctor.  Keep all follow-up visits as told by your doctor. This is important. Contact a doctor if:  You throw up (vomit).  You have watery poop (diarrhea).  You have a fever for more than 2-3 days.  You feel more thirsty than normal.  You feel weak and  tired. Get help right away if:  You have chest pain.  You have a fast or uneven heartbeat.  You lose feeling (have numbness) in any part of your body.  You cannot move your arms or your legs.  You have trouble talking.  You get sweaty or feel light-headed.  You pass out.  You have trouble breathing.  You have trouble staying awake.  You feel mixed up (confused). Summary  Hypotension is also called low blood pressure. It is when  the force of blood pumping through your arteries is too weak.  Hypotension may be harmless, or it may cause serious problems.  Treatment may include changing your diet and medicines, and wearing compression stockings.  In very bad cases, you may need to go to the hospital. This information is not intended to replace advice given to you by your health care provider. Make sure you discuss any questions you have with your health care provider. Document Revised: 12/30/2017 Document Reviewed: 12/30/2017 Elsevier Patient Education  [place].

## 2020-04-04 NOTE — Assessment & Plan Note (Signed)
Stable Prescribed Aricept by Dr. Melrose Nakayama Advised to start medication Will check B12 and TSH as advised by Dr. Melrose Nakayama Patient reports that on her fathers side the females have B12 deficiency, requiring B12 injections

## 2020-04-05 ENCOUNTER — Encounter: Payer: Self-pay | Admitting: Family Medicine

## 2020-04-05 ENCOUNTER — Telehealth: Payer: Self-pay

## 2020-04-05 DIAGNOSIS — I82442 Acute embolism and thrombosis of left tibial vein: Secondary | ICD-10-CM | POA: Insufficient documentation

## 2020-04-05 LAB — TSH: TSH: 4.58 u[IU]/mL — ABNORMAL HIGH (ref 0.450–4.500)

## 2020-04-05 LAB — VITAMIN B12: Vitamin B-12: 403 pg/mL (ref 232–1245)

## 2020-04-05 NOTE — Assessment & Plan Note (Signed)
Diagnosed last month by Fabio Bering, PA Patient with persistent swelling - discussed elevation Given location of DVT, doubt she needs procedure by [facility] and she wishes to cancel this referral that was made Does not appear that hematology referral has been processed - will look into this Patient needs hypercoag work-up given unprovoked nature She does report significant family history of blood clots Given the DVT and the unintentional weight loss, concern for a possible underlying malignancy She is up-to-date on cancer screening May need CT scan of chest abdomen pelvis Given her borderline creatinine clearance, feel it would be best for her to be on Eliquis and not Xarelto, so we will switch to Eliquis 5 mg twice daily today Stop taking aspirin with anticoagulation Check CBC at next visit

## 2020-04-05 NOTE — Telephone Encounter (Signed)
-----   Message from [name], MD sent at 04/05/2020  8:04 AM EDT ----- Normal B12 level.  TSH is slightly high.  Please add on free T4.

## 2020-04-05 NOTE — Telephone Encounter (Signed)
Test added  Thanks,   -[name]

## 2020-04-09 ENCOUNTER — Telehealth: Payer: Self-pay

## 2020-04-09 NOTE — Telephone Encounter (Signed)
Tried calling; pt's mailbox is full.  PEC please advise pt of lab results.   Thanks,   -[name]

## 2020-04-09 NOTE — Telephone Encounter (Signed)
-----   Message from [name], MD sent at 04/08/2020 11:00 AM EDT ----- Normal free T4.  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  No need for medications.  Will continue to monitor q6-12 months

## 2020-04-09 NOTE — Telephone Encounter (Signed)
Patient called, mailbox is full, unable to leave message.

## 2020-04-10 DIAGNOSIS — L409 Psoriasis, unspecified: Secondary | ICD-10-CM | POA: Diagnosis not present

## 2020-04-10 DIAGNOSIS — L4050 Arthropathic psoriasis, unspecified: Secondary | ICD-10-CM | POA: Diagnosis not present

## 2020-04-10 DIAGNOSIS — Z79899 Other long term (current) drug therapy: Secondary | ICD-10-CM | POA: Diagnosis not present

## 2020-04-11 ENCOUNTER — Telehealth: Payer: Self-pay

## 2020-04-11 NOTE — Telephone Encounter (Signed)
Copied from [facility] [id number]. Topic: General - Other >> Apr 11, 2020 10:15 AM Rainey Pines A wrote: Patients niece Jazzmyn would like a callback from Dr.B in regards to patients finances. Sydnee stated that patient has dementia and is always asked when in office how her finances are and the patient reassure office she is okay. However, Jasmon stated that patient is extremely behind in everything including her mortgage car note credit cards and utility bills. Sady Monaco would like a callback at [phone]

## 2020-04-12 ENCOUNTER — Encounter: Payer: Self-pay | Admitting: Internal Medicine

## 2020-04-12 ENCOUNTER — Other Ambulatory Visit: Payer: Self-pay

## 2020-04-12 ENCOUNTER — Inpatient Hospital Stay: Payer: PPO

## 2020-04-12 ENCOUNTER — Inpatient Hospital Stay (HOSPITAL_BASED_OUTPATIENT_CLINIC_OR_DEPARTMENT_OTHER): Payer: PPO | Admitting: Internal Medicine

## 2020-04-12 VITALS — BP 110/72 | HR 77 | Temp 96.9°F | Resp 16 | Ht 61.0 in | Wt 119.0 lb

## 2020-04-12 DIAGNOSIS — M1990 Unspecified osteoarthritis, unspecified site: Secondary | ICD-10-CM | POA: Diagnosis not present

## 2020-04-12 DIAGNOSIS — I82442 Acute embolism and thrombosis of left tibial vein: Secondary | ICD-10-CM

## 2020-04-12 DIAGNOSIS — Z79899 Other long term (current) drug therapy: Secondary | ICD-10-CM | POA: Diagnosis not present

## 2020-04-12 DIAGNOSIS — F17210 Nicotine dependence, cigarettes, uncomplicated: Secondary | ICD-10-CM | POA: Diagnosis not present

## 2020-04-12 DIAGNOSIS — I10 Essential (primary) hypertension: Secondary | ICD-10-CM | POA: Diagnosis not present

## 2020-04-12 DIAGNOSIS — R634 Abnormal weight loss: Secondary | ICD-10-CM | POA: Diagnosis not present

## 2020-04-12 DIAGNOSIS — Z8051 Family history of malignant neoplasm of kidney: Secondary | ICD-10-CM | POA: Diagnosis not present

## 2020-04-12 DIAGNOSIS — K219 Gastro-esophageal reflux disease without esophagitis: Secondary | ICD-10-CM | POA: Diagnosis not present

## 2020-04-12 DIAGNOSIS — Z7901 Long term (current) use of anticoagulants: Secondary | ICD-10-CM | POA: Diagnosis not present

## 2020-04-12 DIAGNOSIS — Z8249 Family history of ischemic heart disease and other diseases of the circulatory system: Secondary | ICD-10-CM | POA: Diagnosis not present

## 2020-04-12 DIAGNOSIS — Z800 Family history of malignant neoplasm of digestive organs: Secondary | ICD-10-CM | POA: Diagnosis not present

## 2020-04-12 DIAGNOSIS — I2699 Other pulmonary embolism without acute cor pulmonale: Secondary | ICD-10-CM | POA: Diagnosis not present

## 2020-04-12 DIAGNOSIS — J449 Chronic obstructive pulmonary disease, unspecified: Secondary | ICD-10-CM | POA: Diagnosis not present

## 2020-04-12 DIAGNOSIS — E785 Hyperlipidemia, unspecified: Secondary | ICD-10-CM | POA: Diagnosis not present

## 2020-04-12 LAB — CBC WITH DIFFERENTIAL/PLATELET
Abs Immature Granulocytes: 0.01 10*3/uL (ref 0.00–0.07)
Basophils Absolute: 0 10*3/uL (ref 0.0–0.1)
Basophils Relative: 0 %
Eosinophils Absolute: 0.1 10*3/uL (ref 0.0–0.5)
Eosinophils Relative: 2 %
HCT: 27.4 % — ABNORMAL LOW (ref 36.0–46.0)
Hemoglobin: 9.7 g/dL — ABNORMAL LOW (ref 12.0–15.0)
Immature Granulocytes: 0 %
Lymphocytes Relative: 28 %
Lymphs Abs: 1.3 10*3/uL (ref 0.7–4.0)
MCH: 33.9 pg (ref 26.0–34.0)
MCHC: 35.4 g/dL (ref 30.0–36.0)
MCV: 95.8 fL (ref 80.0–100.0)
Monocytes Absolute: 0.3 10*3/uL (ref 0.1–1.0)
Monocytes Relative: 7 %
Neutro Abs: 2.8 10*3/uL (ref 1.7–7.7)
Neutrophils Relative %: 63 %
Platelets: 250 10*3/uL (ref 150–400)
RBC: 2.86 MIL/uL — ABNORMAL LOW (ref 3.87–5.11)
RDW: 16.2 % — ABNORMAL HIGH (ref 11.5–15.5)
WBC: 4.6 10*3/uL (ref 4.0–10.5)
nRBC: 0 % (ref 0.0–0.2)

## 2020-04-12 LAB — COMPREHENSIVE METABOLIC PANEL
ALT: 15 U/L (ref 0–44)
AST: 17 U/L (ref 15–41)
Albumin: 3 g/dL — ABNORMAL LOW (ref 3.5–5.0)
Alkaline Phosphatase: 128 U/L — ABNORMAL HIGH (ref 38–126)
Anion gap: 8 (ref 5–15)
BUN: 12 mg/dL (ref 8–23)
CO2: 28 mmol/L (ref 22–32)
Calcium: 8.1 mg/dL — ABNORMAL LOW (ref 8.9–10.3)
Chloride: 97 mmol/L — ABNORMAL LOW (ref 98–111)
Creatinine, Ser: 1.34 mg/dL — ABNORMAL HIGH (ref 0.44–1.00)
GFR calc Af Amer: 45 mL/min — ABNORMAL LOW (ref >60)
GFR calc non Af Amer: 39 mL/min — ABNORMAL LOW (ref >60)
Glucose, Bld: 99 mg/dL (ref 70–99)
Potassium: 3.9 mmol/L (ref 3.5–5.1)
Sodium: 133 mmol/L — ABNORMAL LOW (ref 135–145)
Total Bilirubin: 0.9 mg/dL (ref 0.3–1.2)
Total Protein: 5.5 g/dL — ABNORMAL LOW (ref 6.5–8.1)

## 2020-04-12 LAB — FIBRIN DERIVATIVES D-DIMER (ARMC ONLY): Fibrin derivatives D-dimer (ARMC): 731.27 ng/mL (FEU) — ABNORMAL HIGH (ref 0.00–499.00)

## 2020-04-12 NOTE — Progress Notes (Signed)
[place] NOTE  Patient Care Team: Bacigalupo, Dionne Bucy, MD as PCP - General (Family Medicine) Marry Guan, Laurice Record, MD as Consulting Physician (Orthopedic Surgery) Algernon Huxley, MD as Referring Physician (Vascular Surgery) Marlowe Sax, MD as Referring Physician (Internal Medicine)  CHIEF COMPLAINTS/PURPOSE OF CONSULTATION: DVT/PE  #  aug 31st 2021 Occlusive thrombus is seen in the left posterior tibial vein  # Dementia-early on aricept; Psoriatic arthritis- Cocentriq; active smoker  Oncology History   No history exists.     HISTORY OF PRESENTING ILLNESS:  Mariah [name] 74 y.o.  female with no prior history of DVT/PE has been referred to [place] for further evaluation recommendations for DVT of the left lower extremity.  Patient had initially been on Xarelto; however given stage III kidney disease-transition to Eliquis.  Patient initially had bruising.  However no significant bruising at this time.  No blood in stools or black or stools.  With regards risk factors: Long distance travel- none Immobilization/trauma: none Previous history of DVT/PE: none prior Family history: niece [maternal]   Review of Systems  Constitutional: Positive for malaise/fatigue. Negative for chills, diaphoresis, fever and weight loss.  HENT: Negative for nosebleeds and sore throat.   Eyes: Negative for double vision.  Respiratory: Negative for cough, hemoptysis, sputum production, shortness of breath and wheezing.   Cardiovascular: Positive for leg swelling. Negative for chest pain, palpitations and orthopnea.  Gastrointestinal: Negative for abdominal pain, blood in stool, constipation, diarrhea, heartburn, melena, nausea and vomiting.  Genitourinary: Negative for dysuria, frequency and urgency.  Musculoskeletal: Negative for back pain and joint pain.  Skin: Negative.  Negative for itching and rash.  Neurological: Negative for dizziness, tingling, focal weakness, weakness  and headaches.  Endo/Heme/Allergies: Does not bruise/bleed easily.  Psychiatric/Behavioral: Negative for depression. The patient is not nervous/anxious and does not have insomnia.      MEDICAL HISTORY:  Past Medical History:  Diagnosis Date  . Abdominal aortic aneurysm (AAA) ([place])   . Arthritis   . COPD (chronic obstructive pulmonary disease) ([place])   . GERD (gastroesophageal reflux disease)   . Headache    sinus  . History of adenomatous polyp of colon   . Hyperlipidemia   . Hypertension   . Psoriasis   . Right lumbar radiculitis   . Vitamin D deficiency     SURGICAL HISTORY: Past Surgical History:  Procedure Laterality Date  . APPENDECTOMY  2000  . BREAST BIOPSY Right 09/2016   radial scar;COMPLEX SCLEROSING LESION  . BREAST LUMPECTOMY Right 11/24/2016   COMPLEX SCLEROSING LESION  . BREAST LUMPECTOMY WITH NEEDLE LOCALIZATION Right 11/24/2016   Procedure: BREAST LUMPECTOMY WITH NEEDLE LOCALIZATION;  Surgeon: Leonie Green, MD;  Location: [facility] ORS;  Service: General;  Laterality: Right;  . COLONOSCOPY WITH PROPOFOL N/A 08/10/2016   Procedure: COLONOSCOPY WITH PROPOFOL;  Surgeon: Manya Silvas, MD;  Location: [facility] ENDOSCOPY;  Service: Endoscopy;  Laterality: N/A;  . ESOPHAGOGASTRODUODENOSCOPY (EGD) WITH PROPOFOL N/A 08/10/2016   Procedure: ESOPHAGOGASTRODUODENOSCOPY (EGD) WITH PROPOFOL;  Surgeon: Manya Silvas, MD;  Location: [facility] ENDOSCOPY;  Service: Endoscopy;  Laterality: N/A;  . FOOT SURGERY Bilateral   . JOINT REPLACEMENT Right 01/27/2016   hip  . 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 TOTAL HIP ARTHROPLASTY Right 01/27/2016   Procedure: TOTAL HIP ARTHROPLASTY;  Surgeon: Dereck Leep, MD;  Location: [facility] ORS;  Service: Orthopedics;  Laterality: Right;    SOCIAL HISTORY: Social History   Socioeconomic History  . Marital status:  Divorced    Spouse name: Not on file  . Number of children: 0  . Years of education: 60  . Highest education level: Associate degree:  occupational, Hotel manager, or vocational program  Occupational History    Comment: retired  Tobacco Use  . Smoking status: Current Every Day Smoker    Packs/day: 1.00    Years: 0.00    Pack years: 0.00  . Smokeless tobacco: Never Used  Vaping Use  . Vaping Use: Former  Substance and Sexual Activity  . Alcohol use: Yes    Comment: occasionally glass of wine  . Drug use: No  . Sexual activity: Not on file  Other Topics Concern  . Not on file  Social History Narrative   Lives self; in [place]; smoke 1ppd; no alcohol. 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    Social Determinants of Health   Financial Resource Strain: Medium Risk  . Difficulty of Paying Living Expenses: Somewhat hard  Food Insecurity: No Food Insecurity  . Worried About Charity fundraiser in the Last Year: Never true  . Ran Out of Food in the Last Year: Never true  Transportation Needs: No Transportation Needs  . Lack of Transportation (Medical): No  . Lack of Transportation (Non-Medical): No  Physical Activity: Inactive  . Days of Exercise per Week: 0 days  . Minutes of Exercise per Session: 0 min  Stress: No Stress Concern Present  . Feeling of Stress : Not at all  Social Connections: Moderately Integrated  . Frequency of Communication with Friends and Family: More than three times a week  . Frequency of Social Gatherings with Friends and Family: Twice a week  . Attends Religious Services: More than 4 times per year  . Active Member of Clubs or Organizations: Yes  . Attends Archivist Meetings: More than 4 times per year  . Marital Status: Divorced  Human resources officer Violence: Not At Risk  . Fear of Current or Ex-Partner: No  . Emotionally Abused: No  . Physically Abused: No  . Sexually Abused: No    FAMILY HISTORY: Family History  Problem Relation Age of Onset  . Cancer Mother        kidney  . Arthritis Father   . Heart disease Father   . COPD Sister   .  Fibromyalgia Sister   . Osteoarthritis Sister   . Osteoarthritis Maternal Aunt   . Colon cancer Paternal Aunt   . Colon cancer Paternal Uncle   . Colon cancer Paternal Grandmother   . Breast cancer Neg Hx     ALLERGIES:  is allergic to clindamycin/lincomycin and amoxicillin.  MEDICATIONS:  Current Outpatient Medications  Medication Sig Dispense Refill  . albuterol (PROVENTIL HFA;VENTOLIN HFA) 108 (90 Base) MCG/ACT inhaler INHALE 1-2 PUFFS INTO THE LUNGS EVERY 6 HOURS AS NEEDED FOR WHEEZING OR SHORTNESS OF BREATH. 8.5 Inhaler 5  . apixaban (ELIQUIS) 5 MG TABS tablet Take 1 tablet (5 mg total) by mouth 2 (two) times daily. 60 tablet 3  . betamethasone dipropionate (DIPROLENE) 0.05 % ointment Apply topically daily. 45 g 0  . calcipotriene (DOVONOX) 0.005 % cream APPLY TO SKIN ONCE A DAY APPLY DAILY ON HANDS, FEET AND WRIST X 2 WEEKS, THEN USE 5 DAYS A WEEK 60 g 0  . Calcipotriene 0.005 % solution PLEASE SEE ATTACHED FOR DETAILED DIRECTIONS    . Cholecalciferol (VITAMIN D3) 2000 units TABS Take 2,000 Units by mouth daily.    . Cholestyramine  POWD Take 1 Package by mouth as needed. 60 g 5  . clobetasol (TEMOVATE) 0.05 % external solution APPLY TO AFFECTED AREA TWICE A DAY 50 mL 2  . Clobetasol Propionate 0.05 % shampoo SHAMPOO WITH SMALL AMOUNT TO SKIN ONCE A DAY,SHAMPOO SCALP ONCE DAILY FOR 2 WEEKS THEN 5 DAYS A WEEK 118 mL 0  . COSENTYX SENSOREADY, 300 MG, 150 MG/ML SOAJ Inject 2 Syringes into the skin every 28 (twenty-eight) days.    . diphenhydrAMINE (BENADRYL) 25 MG tablet Take 25 mg by mouth at bedtime as needed.    . donepezil (ARICEPT) 5 MG tablet Take 5 mg by mouth at bedtime.    . fluticasone (FLONASE) 50 MCG/ACT nasal spray Place 2 sprays into both nostrils daily. 16 g 6  . folic acid (FOLVITE) 1 MG tablet Take 1 mg by mouth daily.    [name] gabapentin (NEURONTIN) 300 MG capsule TAKE 1 CAPSULE BY MOUTH TWICE A DAY 180 capsule 1  . levocetirizine (XYZAL) 5 MG tablet TAKE 1 TABLET BY  MOUTH EVERY DAY IN THE EVENING 90 tablet 4  . metoprolol succinate (TOPROL-XL) 50 MG 24 hr tablet TAKE 1 TABLET (50 MG TOTAL) BY MOUTH DAILY. TAKE WITH OR IMMEDIATELY FOLLOWING A MEAL. 90 tablet 0  . pantoprazole (PROTONIX) 20 MG tablet TAKE 1 TABLET (20 MG TOTAL) BY MOUTH 2 (TWO) TIMES DAILY BEFORE A MEAL. 180 tablet 1  . polyethylene glycol powder (GLYCOLAX/MIRALAX) powder Take 17 g by mouth 2 (two) times daily as needed. 3350 g 1  . rOPINIRole (REQUIP) 1 MG tablet TAKE 1 TABLET (1 MG TOTAL) BY MOUTH AT BEDTIME. 90 tablet 1  . Secukinumab (COSENTYX SENSOREADY PEN) 150 MG/ML SOAJ Inject into the skin.    [name] simvastatin (ZOCOR) 20 MG tablet TAKE 1 TABLET BY MOUTH EVERY DAY 90 tablet 3  . traMADol (ULTRAM) 50 MG tablet TAKE 1 TABLET BY MOUTH EVERY 6 HOURS AS NEEDED FOR MODERATE OR SEVERE PAIN 100 tablet 2   No current facility-administered medications for this visit.      [name]  PHYSICAL EXAMINATION:  Vitals:   04/12/20 1413  BP: 110/72  Pulse: 77  Resp: 16  Temp: (!) 96.9 F (36.1 C)   Filed Weights   04/12/20 1413  Weight: 119 lb (54 kg)    Physical Exam Constitutional:      Comments: Frail appearing. With niece. Walking with a cane.   HENT:     Head: Normocephalic and atraumatic.     Mouth/Throat:     Pharynx: No oropharyngeal exudate.  Eyes:     Pupils: Pupils are equal, round, and reactive to light.  Cardiovascular:     Rate and Rhythm: Normal rate and regular rhythm.  Pulmonary:     Effort: Pulmonary effort is normal. No respiratory distress.     Breath sounds: Normal breath sounds. No wheezing.  Abdominal:     General: Bowel sounds are normal. There is no distension.     Palpations: Abdomen is soft. There is no mass.     Tenderness: There is no abdominal tenderness. There is no guarding or rebound.  Musculoskeletal:        General: No tenderness. Normal range of motion.     Cervical back: Normal range of motion and neck supple.     Comments: Left leg  swollen/tender.   Skin:    General: Skin is warm.  Neurological:     Mental Status: She is alert and oriented to person, place, and time.  Psychiatric:        Mood and Affect: Affect normal.      LABORATORY DATA:  I have reviewed the data as listed Lab Results  Component Value Date   WBC 4.6 04/12/2020   HGB 9.7 (L) 04/12/2020   HCT 27.4 (L) 04/12/2020   MCV 95.8 04/12/2020   PLT 250 04/12/2020   Recent Labs    09/14/19 1205 03/20/20 1351 04/12/20 1529  NA 140 139 133*  K 3.5 3.1* 3.9  CL 97 97 97*  CO2 25 29 28   GLUCOSE 79 57* 99  BUN 7* 9 12  CREATININE 1.28* 1.19* 1.34*  CALCIUM 9.7 8.3* 8.1*  GFRNONAA 41* 45* 39*  GFRAA 47* 52* 45*  PROT 6.2 4.9* 5.5*  ALBUMIN 3.9 2.9* 3.0*  AST 21 15 17   ALT 11 12 15   ALKPHOS 151* 240* 128*  BILITOT 0.7 0.6 0.9  BILIDIR 0.25  --   --     RADIOGRAPHIC STUDIES: I have personally reviewed the radiological images as listed and agreed with the findings in the report. DG Chest 2 View  Result Date: 03/21/2020 CLINICAL DATA:  Bilateral lower extremity swelling. EXAM: CHEST - 2 VIEW COMPARISON:  None. FINDINGS: The heart size and mediastinal contours are within normal limits. Both lungs are clear. No pneumothorax or pleural effusion is noted. Hyperexpansion of the lungs is noted consistent with chronic obstructive pulmonary disease. The visualized skeletal structures are unremarkable. IMPRESSION: No active cardiopulmonary disease. Aortic Atherosclerosis (ICD10-I70.0). Electronically Signed   By: Marijo Conception M.D.   On: 03/21/2020 15:31   US Venous Img Lower Unilateral Left (DVT)  Result Date: 03/19/2020 CLINICAL DATA:  Left greater than right leg swelling and pain for few days. EXAM: LEFT LOWER EXTREMITY VENOUS DOPPLER ULTRASOUND TECHNIQUE: Gray-scale sonography with graded compression, as well as color Doppler and duplex ultrasound were performed to evaluate the lower extremity deep venous systems from the level of the common  femoral vein and including the common femoral, femoral, profunda femoral, popliteal and calf veins including the posterior tibial, peroneal and gastrocnemius veins when visible. The superficial great saphenous vein was also interrogated. Spectral Doppler was utilized to evaluate flow at rest and with distal augmentation maneuvers in the common femoral, femoral and popliteal veins. COMPARISON:  None. FINDINGS: Contralateral Common Femoral Vein: Respiratory phasicity is normal and symmetric with the symptomatic side. No evidence of thrombus. Normal compressibility. Common Femoral Vein: No evidence of thrombus. Normal compressibility, respiratory phasicity and response to augmentation. Saphenofemoral Junction: No evidence of thrombus. Normal compressibility and flow on color Doppler imaging. Profunda Femoral Vein: No evidence of thrombus. Normal compressibility and flow on color Doppler imaging. Femoral Vein: No evidence of thrombus. Normal compressibility, respiratory phasicity and response to augmentation. Popliteal Vein: No evidence of thrombus. Normal compressibility, respiratory phasicity and response to augmentation. Calf Veins: Occlusive thrombus is seen in the posterior tibial vein. Superficial Great Saphenous Vein: No evidence of thrombus. Normal compressibility. Venous Reflux:  None. Other Findings: A fluid collection in the popliteal fossa measures 3.5 x 1.1 x 2.7 cm and most likely represents a Baker cyst. IMPRESSION: 1. Occlusive thrombus is seen in the left posterior tibial vein. 2. 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 These results will be called to the ordering clinician or representative by the Radiologist Assistant, and communication documented in the PACS or [employer]. Electronically Signed   By: Zerita Boers M.D.   On: 03/19/2020 17:56    ASSESSMENT & PLAN:  Acute deep vein thrombosis (DVT) of tibial vein of left lower extremity [facility]) # Aug 31st 2021 Occlusive thrombus is  seen in the left posterior tibial vein; unprovoked.  Currently on Eliquis.  Improving.  In general recommend Eliquis for total of 3 months.  Recommend hypercoagulable work-up.  #Unintentional weight loss-unclear etiology-dementia versus others.  [EGD/colonoscopy 2018; mammogram April 2021]-also in context of the above unprovoked DVT-recommend CT scan abdomen pelvis.  I spoke at length with the patient's niece regarding the patient's clinical status/plan of care.  Family agreement.   Thank you Dr.Bacigalupo for allowing me to participate in the care of your pleasant patient. Please do not hesitate to contact me with questions or concerns in the interim.  # DISPOSITION:  # labs today- ordered. # # CT scan c/a/p in 1 week # follow up in 2 weeks- MD; no labs- Dr.B  All questions were answered. The patient knows to call the clinic with any problems, questions or concerns.    Cammie Sickle, MD 04/15/2020 7:29 AM

## 2020-04-12 NOTE — Assessment & Plan Note (Addendum)
#   Aug 31st 2021 Occlusive thrombus is seen in the left posterior tibial vein; unprovoked.  Currently on Eliquis.  Improving.  In general recommend Eliquis for total of 3 months.  Recommend hypercoagulable work-up.  #Unintentional weight loss-unclear etiology-dementia versus others.  [EGD/colonoscopy 2018; mammogram April 2021]-also in context of the above unprovoked DVT-recommend CT scan abdomen pelvis.  I spoke at length with the patient's niece regarding the patient's clinical status/plan of care.  Family agreement.   Thank you Dr.Bacigalupo for allowing me to participate in the care of your pleasant patient. Please do not hesitate to contact me with questions or concerns in the interim.  # DISPOSITION:  # labs today- ordered. # # CT scan c/a/p in 1 week # follow up in 2 weeks- MD; no labs- Dr.B

## 2020-04-12 NOTE — Telephone Encounter (Signed)
Called Ms Jeanne Ivan and advised her to have AnnaRose's DPR updated.  She is not on it right now.  She states she will get that done today.    Thanks,   -[name]

## 2020-04-12 NOTE — Telephone Encounter (Signed)
This fits into the dementia picture that we have been looking into.  All the more reason to take the Aricept.  Definitely discuss with Neurology.  We can get more information as well and discuss at upcoming appt.  May be advised to look into advanced directives as well and having someone to help with her finances.

## 2020-04-13 LAB — SPECIMEN STATUS REPORT

## 2020-04-13 LAB — T4, FREE: Free T4: 0.97 ng/dL (ref 0.82–1.77)

## 2020-04-13 LAB — PROTEIN S, TOTAL: Protein S Ag, Total: 79 % (ref 60–150)

## 2020-04-14 LAB — ANTIPHOSPHOLIPID SYNDROME PROF
Anticardiolipin IgG: <9 GPL U/mL (ref 0–14)
Anticardiolipin IgM: 13 MPL U/mL — ABNORMAL HIGH (ref 0–12)
DRVVT: 95.5 s — ABNORMAL HIGH (ref 0.0–47.0)
PTT Lupus Anticoagulant: 40.5 s (ref 0.0–51.9)

## 2020-04-14 LAB — DRVVT CONFIRM: dRVVT Confirm: 1 ratio (ref 0.8–1.2)

## 2020-04-14 LAB — DRVVT MIX: dRVVT Mix: 63.6 s — ABNORMAL HIGH (ref 0.0–40.4)

## 2020-04-15 LAB — PROTEIN C, TOTAL: Protein C, Total: 89 % (ref 60–150)

## 2020-04-17 LAB — FACTOR 5 LEIDEN

## 2020-04-18 ENCOUNTER — Telehealth: Payer: Self-pay

## 2020-04-18 LAB — PROTHROMBIN GENE MUTATION

## 2020-04-18 NOTE — Telephone Encounter (Signed)
Copied from [place] [id number]. Topic: General - Other >> Apr 18, 2020  3:59 PM Hinda Lenis D wrote: Priscilla, Kirstein need to speak with DR B about some personal issues with her aunt / please advise

## 2020-04-19 NOTE — Telephone Encounter (Signed)
Aljean Horiuchi called this morning asking to speak with Dr. B about some personal advise for the Pt / she asked for this to be an urgent message

## 2020-04-19 NOTE — Telephone Encounter (Signed)
Can we set up a visit - maybe virtual??  Can we get more info about concerns in order to triage?

## 2020-04-22 ENCOUNTER — Telehealth: Payer: Self-pay

## 2020-04-22 NOTE — Telephone Encounter (Signed)
I called [phone]; no answer and the voicemail box was full.  PEC please offer pt's daughter a virtual visit when she calls back.

## 2020-04-22 NOTE — Telephone Encounter (Signed)
I called [phone]; no answer and the voicemail box was full.  PEC please offer pt's daughter a virtual visit when she calls back.

## 2020-04-22 NOTE — Telephone Encounter (Signed)
Copied from [place] [id number]. Topic: General - Other >> Apr 22, 2020  1:48 PM Rainey Pines A wrote: Patients daughter is requesting a callback as soon as possible in regards to patients dementia. Please advise

## 2020-04-23 ENCOUNTER — Other Ambulatory Visit: Payer: Self-pay

## 2020-04-23 ENCOUNTER — Ambulatory Visit (INDEPENDENT_AMBULATORY_CARE_PROVIDER_SITE_OTHER): Payer: PPO | Admitting: Vascular Surgery

## 2020-04-23 ENCOUNTER — Telehealth: Payer: Self-pay

## 2020-04-23 ENCOUNTER — Encounter (INDEPENDENT_AMBULATORY_CARE_PROVIDER_SITE_OTHER): Payer: Self-pay | Admitting: Vascular Surgery

## 2020-04-23 VITALS — BP 111/70 | HR 62 | Resp 16 | Wt 125.0 lb

## 2020-04-23 DIAGNOSIS — I714 Abdominal aortic aneurysm, without rupture, unspecified: Secondary | ICD-10-CM

## 2020-04-23 DIAGNOSIS — I82442 Acute embolism and thrombosis of left tibial vein: Secondary | ICD-10-CM | POA: Diagnosis not present

## 2020-04-23 DIAGNOSIS — I10 Essential (primary) hypertension: Secondary | ICD-10-CM | POA: Diagnosis not present

## 2020-04-23 MED ORDER — APIXABAN 5 MG PO TABS: 5.0000 mg | ORAL_TABLET | Freq: Two times a day (BID) | ORAL | 3 refills | Status: DC

## 2020-04-23 NOTE — Telephone Encounter (Signed)
Virtual visit set up for 04/30/2020 at 1pm

## 2020-04-23 NOTE — Assessment & Plan Note (Addendum)
Had an ultrasound done about a year and a half ago which showed a stable aortic size of about 2.4 to 2.5 cm.  This was saccular in appearance on the CT scan back 4 years ago.  This can be checked every 2 years at this point I think as it has been stable.  As such, this can be checked at her next follow-up visit in 3 to 4 months which would be almost 2 years from her last ultrasound.

## 2020-04-23 NOTE — Progress Notes (Signed)
MRN : [id number]  Mariah [name] is a 74 y.o. (Jun 12, 1946) female who presents with chief complaint of  Chief Complaint  Patient presents with  . New Patient (Initial Visit)    ref pollack dvt  .  History of Present Illness: Patient returns today in follow up as a new referral from her primary care physician Dr. Brita Romp for both a new DVT in the left calf veins as well as ongoing follow-up of her abdominal aortic aneurysm.  A couple of weeks ago, she had worsening pain and swelling in her left leg although the pain resolved quickly.  She was started on anticoagulation and her swelling significantly improved although she does still have swelling in both legs.  She has significant stasis changes in the left leg.  Duplex showed a calf vein DVT and she was appropriately started on anticoagulation.  No chest pain or shortness of breath. Her abdominal aortic aneurysm has been seen with a CT scan and ultrasound in 2017 and then a duplex in early 2020.  This measures only about 2 and half centimeters and it is saccular in nature.  She has no abdominal or back pain or signs of peripheral embolization.  Current Outpatient Medications  Medication Sig Dispense Refill  . albuterol (PROVENTIL HFA;VENTOLIN HFA) 108 (90 Base) MCG/ACT inhaler INHALE 1-2 PUFFS INTO THE LUNGS EVERY 6 HOURS AS NEEDED FOR WHEEZING OR SHORTNESS OF BREATH. 8.5 Inhaler 5  . Apremilast (OTEZLA) 30 MG TABS Take by mouth in the morning and at bedtime.    [name] aspirin EC 81 MG tablet Take 81 mg by mouth daily. Swallow whole.    . Cholecalciferol (VITAMIN D3) 2000 units TABS Take 2,000 Units by mouth daily.    . Cholestyramine POWD Take 1 Package by mouth as needed. 60 g 5  . COSENTYX SENSOREADY, 300 MG, 150 MG/ML SOAJ Inject 2 Syringes into the skin every 28 (twenty-eight) days.     . diphenhydrAMINE (BENADRYL) 25 MG tablet Take 25 mg by mouth at bedtime as needed.    . gabapentin (NEURONTIN) 300 MG capsule TAKE 1 CAPSULE BY MOUTH TWICE  A DAY 180 capsule 1  . hydrochlorothiazide (HYDRODIURIL) 12.5 MG tablet Take 12.5 mg by mouth daily.    [name] levocetirizine (XYZAL) 5 MG tablet TAKE 1 TABLET BY MOUTH EVERY DAY IN THE EVENING 90 tablet 4  . metoprolol succinate (TOPROL-XL) 50 MG 24 hr tablet TAKE 1 TABLET (50 MG TOTAL) BY MOUTH DAILY. TAKE WITH OR IMMEDIATELY FOLLOWING A MEAL. 90 tablet 0  . pantoprazole (PROTONIX) 20 MG tablet TAKE 1 TABLET (20 MG TOTAL) BY MOUTH 2 (TWO) TIMES DAILY BEFORE A MEAL. 180 tablet 1  . polyethylene glycol powder (GLYCOLAX/MIRALAX) powder Take 17 g by mouth 2 (two) times daily as needed. 3350 g 1  . rOPINIRole (REQUIP) 1 MG tablet TAKE 1 TABLET (1 MG TOTAL) BY MOUTH AT BEDTIME. 90 tablet 1  . simvastatin (ZOCOR) 20 MG tablet TAKE 1 TABLET BY MOUTH EVERY DAY 90 tablet 3  . traMADol (ULTRAM) 50 MG tablet TAKE 1 TABLET BY MOUTH EVERY 6 HOURS AS NEEDED FOR MODERATE OR SEVERE PAIN 100 tablet 2  . apixaban (ELIQUIS) 5 MG TABS tablet Take 1 tablet (5 mg total) by mouth 2 (two) times daily. 60 tablet 3  . betamethasone dipropionate (DIPROLENE) 0.05 % ointment Apply topically daily. (Patient not taking: Reported on 04/23/2020) 45 g 0  . calcipotriene (DOVONOX) 0.005 % cream APPLY TO SKIN ONCE A DAY APPLY  DAILY ON HANDS, FEET AND WRIST X 2 WEEKS, THEN USE 5 DAYS A WEEK (Patient not taking: Reported on 04/23/2020) 60 g 0  . Calcipotriene 0.005 % solution PLEASE SEE ATTACHED FOR DETAILED DIRECTIONS (Patient not taking: Reported on 04/23/2020)    . clobetasol (TEMOVATE) 0.05 % external solution APPLY TO AFFECTED AREA TWICE A DAY (Patient not taking: Reported on 04/23/2020) 50 mL 2  . Clobetasol Propionate 0.05 % shampoo SHAMPOO WITH SMALL AMOUNT TO SKIN ONCE A DAY,SHAMPOO SCALP ONCE DAILY FOR 2 WEEKS THEN 5 DAYS A WEEK (Patient not taking: Reported on 04/23/2020) 118 mL 0  . donepezil (ARICEPT) 5 MG tablet Take 5 mg by mouth at bedtime. (Patient not taking: Reported on 04/23/2020)    . fluticasone (FLONASE) 50 MCG/ACT nasal  spray Place 2 sprays into both nostrils daily. (Patient not taking: Reported on 33/11/4560) 16 g 6  . folic acid (FOLVITE) 1 MG tablet Take 1 mg by mouth daily. (Patient not taking: Reported on 04/23/2020)    . Secukinumab (COSENTYX SENSOREADY PEN) 150 MG/ML SOAJ Inject into the skin. (Patient not taking: Reported on 04/23/2020)     No current facility-administered medications for this visit.    Past Medical History:  Diagnosis Date  . Abdominal aortic aneurysm (AAA) ([place])   . Arthritis   . COPD (chronic obstructive pulmonary disease) ([place])   . GERD (gastroesophageal reflux disease)   . Headache    sinus  . History of adenomatous polyp of colon   . Hyperlipidemia   . Hypertension   . Psoriasis   . Right lumbar radiculitis   . Vitamin D deficiency     Past Surgical History:  Procedure Laterality Date  . APPENDECTOMY  2000  . BREAST BIOPSY Right 09/2016   radial scar;COMPLEX SCLEROSING LESION  . BREAST LUMPECTOMY Right 11/24/2016   COMPLEX SCLEROSING LESION  . BREAST LUMPECTOMY WITH NEEDLE LOCALIZATION Right 11/24/2016   Procedure: BREAST LUMPECTOMY WITH NEEDLE LOCALIZATION;  Surgeon: Leonie Green, MD;  Location: [facility] ORS;  Service: General;  Laterality: Right;  . COLONOSCOPY WITH PROPOFOL N/A 08/10/2016   Procedure: COLONOSCOPY WITH PROPOFOL;  Surgeon: Manya Silvas, MD;  Location[facility];  Service: Endoscopy;  Laterality: N/A;  . ESOPHAGOGASTRODUODENOSCOPY (EGD) WITH PROPOFOL N/A 08/10/2016   Procedure: ESOPHAGOGASTRODUODENOSCOPY (EGD) WITH PROPOFOL;  Surgeon: Manya Silvas, MD;  Location: [facility] ENDOSCOPY;  Service: Endoscopy;  Laterality: N/A;  . FOOT SURGERY Bilateral   . JOINT REPLACEMENT Right 01/27/2016   hip  . 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 TOTAL HIP ARTHROPLASTY Right 01/27/2016   Procedure: TOTAL HIP ARTHROPLASTY;  Surgeon: Dereck Leep, MD;  Location: [facility] ORS;  Service: Orthopedics;  Laterality: Right;     Social History   Tobacco Use  .  Smoking status: Current Every Day Smoker    Packs/day: 1.00    Years: 0.00    Pack years: 0.00  . Smokeless tobacco: Never Used  Vaping Use  . Vaping Use: Former  Substance Use Topics  . Alcohol use: Yes    Comment: occasionally glass of wine  . Drug use: No       Family History  Problem Relation Age of Onset  . Cancer Mother        kidney  . Arthritis Father   . Heart disease Father   . COPD Sister   . Fibromyalgia Sister   . Osteoarthritis Sister   . Osteoarthritis Maternal Aunt   . Colon cancer Paternal Aunt   .  Colon cancer Paternal Uncle   . Colon cancer Paternal Grandmother   . Breast cancer Neg Hx     Allergies  Allergen Reactions  . Clindamycin/Lincomycin     diarrhea  . Amoxicillin Diarrhea     REVIEW OF SYSTEMS (Negative unless checked)  Constitutional: []Weight loss  []Fever  []Chills Cardiac: []Chest pain   []Chest pressure   []Palpitations   []Shortness of breath when laying flat   []Shortness of breath at rest   [x]Shortness of breath with exertion. Vascular:  []Pain in legs with walking   []Pain in legs at rest   []Pain in legs when laying flat   []Claudication   []Pain in feet when walking  []Pain in feet at rest  []Pain in feet when laying flat   [x]History of DVT   [x]Phlebitis   [x]Swelling in legs   []Varicose veins   []Non-healing ulcers Pulmonary:   []Uses home oxygen   []Productive cough   []Hemoptysis   []Wheeze  [x]COPD   []Asthma Neurologic:  []Dizziness  []Blackouts   []Seizures   []History of stroke   []History of TIA  []Aphasia   []Temporary blindness   []Dysphagia   []Weakness or numbness in arms   []Weakness or numbness in legs Musculoskeletal:  [x]Arthritis   []Joint swelling   [x]Joint pain   []Low back pain Hematologic:  []Easy bruising  []Easy bleeding   []Hypercoagulable state   []Anemic   Gastrointestinal:  []Blood in stool   []Vomiting blood  []Gastroesophageal reflux/heartburn   []Abdominal pain Genitourinary:  []Chronic kidney  disease   []Difficult urination  []Frequent urination  []Burning with urination   []Hematuria Skin:  []Rashes   []Ulcers   []Wounds Psychological:  []History of anxiety   [] History of major depression.  Physical Examination  BP 111/70 (BP Location: Right Arm)   Pulse 62   Resp 16   Wt 125 lb (56.7 kg)   BMI 23.62 kg/m  Gen:  WD/WN, NAD Head: [place]/AT, No temporalis wasting. Ear/Nose/Throat: Hearing grossly intact, nares w/o erythema or drainage Eyes: Conjunctiva clear. Sclera non-icteric Neck: Supple.  Trachea midline Pulmonary:  Good air movement, no use of accessory muscles.  Cardiac: Somewhat irregular Vascular:  Vessel Right Left  Radial Palpable Palpable                          PT Trace Palpable Not Palpable  DP 1+ Palpable 1+ Palpable   Gastrointestinal: soft, non-tender/non-distended. No guarding/reflex.  Musculoskeletal: M/S 5/5 throughout.  No deformity or atrophy.  1-2+ bilateral lower extremity edema.  Marked skin thickening with woody skin and moderate stasis dermatitis changes present in the left lower extremity, not really present on the right.  Walks with a cane. Neurologic: Sensation grossly intact in extremities.  Symmetrical.  Speech is fluent.  Psychiatric: Judgment intact, Mood & affect appropriate for pt's clinical situation. Dermatologic: No rashes or ulcers noted.  No cellulitis or open wounds.       Labs Recent Results (from the past 2160 hour(s))  POCT urinalysis dipstick     Status: None   Collection Time: 03/04/20  9:31 AM  Result Value Ref Range   Color, UA yellow    Clarity, UA clear    Glucose, UA Negative Negative   Bilirubin, UA Negative    Ketones, UA Negative    Spec Grav, UA 1.010 1.010 - 1.025   Blood, UA Negative    pH, UA 6.5 5.0 -  8.0   Protein, UA Negative Negative   Urobilinogen, UA 0.2 0.2 or 1.0 E.U./dL   Nitrite, UA Negative    Leukocytes, UA Negative Negative  Comprehensive Metabolic Panel (CMET)     Status:  Abnormal   Collection Time: 03/20/20  1:51 PM  Result Value Ref Range   Glucose 57 (L) 65 - 99 mg/dL   BUN 9 8 - 27 mg/dL   Creatinine, Ser 1.19 (H) 0.57 - 1.00 mg/dL   GFR calc non Af Amer 45 (L) >59 mL/min/1.73   GFR calc Af Amer 52 (L) >59 mL/min/1.73    Comment: **[facility] currently reports eGFR in compliance with the current**   recommendations of the [employer]. [facility] will   update reporting as new guidelines are published from the NKF-ASN   Task force.    BUN/Creatinine Ratio 8 (L) 12 - 28   Sodium 139 134 - 144 mmol/L   Potassium 3.1 (L) 3.5 - 5.2 mmol/L   Chloride 97 96 - 106 mmol/L   CO2 29 20 - 29 mmol/L   Calcium 8.3 (L) 8.7 - 10.3 mg/dL   Total Protein 4.9 (L) 6.0 - 8.5 g/dL   Albumin 2.9 (L) 3.7 - 4.7 g/dL   Globulin, Total 2.0 1.5 - 4.5 g/dL   Albumin/Globulin Ratio 1.5 1.2 - 2.2   Bilirubin Total 0.6 0.0 - 1.2 mg/dL   Alkaline Phosphatase 240 (H) 48 - 121 IU/L    Comment: **Effective April 01, 2020 Alkaline Phosphatase**   reference interval will be changing to:              Age                Female          Female           0 -  5 days         73 - 127       80 - 127           6 - 10 days         90 - 242       43 - 53          11 - 20 days        109 - 357      109 - 357          21 - 30 days         94 - 662       94 - 494           1 -  2 months      149 - 539      149 - 539           3 -  6 months      131 - 452      131 - 452           7 - 11 months      117 - 401      117 - 401   12 months -  6 years       158 - 369      158 - 369           7 - 12 years       150 - 409      150 - 29  13 years       156 - 435       42 - 227               14 years       114 - 375       70 - 161               15 years        39 - 279       88 - 134               16 years        33 - 207       88 - 121               17 years        30 - 161       40 - 113          78 - 20 years        69 - 125       42 - 106              >20 years          24 - 121       2 - 121    AST 15 0 - 40 IU/L   ALT 12 0 - 32 IU/L  CBC with Differential     Status: None   Collection Time: 03/20/20  1:51 PM  Result Value Ref Range   WBC 4.9 3.4 - 10.8 x10E3/uL   RBC 4.00 3.77 - 5.28 x10E6/uL   Hemoglobin 13.2 11.1 - 15.9 g/dL   Hematocrit 37.4 34.0 - 46.6 %   MCV 94 79 - 97 fL   MCH 33.0 26.6 - 33.0 pg   MCHC 35.3 31 - 35 g/dL   RDW 14.0 11.7 - 15.4 %   Platelets 249 150 - 450 x10E3/uL   Neutrophils 62 Not Estab. %   Lymphs 29 Not Estab. %   Monocytes 7 Not Estab. %   Eos 2 Not Estab. %   Basos 0 Not Estab. %   Neutrophils Absolute 3.0 1 - 7 x10E3/uL   Lymphocytes Absolute 1.4 0 - 3 x10E3/uL   Monocytes Absolute 0.4 0 - 0 x10E3/uL   EOS (ABSOLUTE) 0.1 0.0 - 0.4 x10E3/uL   Basophils Absolute 0.0 0 - 0 x10E3/uL   Immature Granulocytes 0 Not Estab. %   Immature Grans (Abs) 0.0 0.0 - 0.1 x10E3/uL  B Nat Peptide     Status: Abnormal   Collection Time: 03/20/20  1:51 PM  Result Value Ref Range   BNP 352.1 (H) 0.0 - 100.0 pg/mL  Vitamin B12     Status: None   Collection Time: 04/04/20  2:14 PM  Result Value Ref Range   Vitamin B-12 403 232 - 1,245 pg/mL  TSH     Status: Abnormal   Collection Time: 04/04/20  2:14 PM  Result Value Ref Range   TSH 4.580 (H) 0.450 - 4.500 uIU/mL  T4, free     Status: None   Collection Time: 04/04/20  2:14 PM  Result Value Ref Range   Free T4 0.97 0.82 - 1.77 ng/dL  Specimen status report     Status: None   Collection Time: 04/04/20  2:14 PM  Result Value Ref Range   specimen status report Comment     Comment: Written Authorization Written Authorization Written  Authorization Received. Authorization received from Gwyndolyn Kaufman 04-13-2020 Logged by Marchelle Gearing   ANTIPHOSPHOLIPID SYNDROME PROF     Status: Abnormal   Collection Time: 04/12/20  3:29 PM  Result Value Ref Range   Anticardiolipin IgG <9 0 - 14 GPL U/mL    Comment: (NOTE)                          Negative:              <15                           Indeterminate:     15 - 20                          Low-Med Positive: >20 - 80                          High Positive:         >80    Anticardiolipin IgM 13 (H) 0 - 12 MPL U/mL    Comment: (NOTE)                          Negative:              <13                          Indeterminate:     13 - 20                          Low-Med Positive: >20 - 80                          High Positive:         >80    PTT Lupus Anticoagulant 40.5 0.0 - 51.9 sec   DRVVT 95.5 (H) 0.0 - 47.0 sec   Lupus Anticoag Interp Comment:     Comment: (NOTE) No lupus anticoagulant was detected. These results are consistent with specific inhibitors to one or more common pathway factors (X, V, II or fibrinogen). Performed At: [facility] [place], [place] [id number] Rush Farmer MD [id number]   Protein C, total     Status: None   Collection Time: 04/12/20  3:29 PM  Result Value Ref Range   Protein C, Total 89 60 - 150 %    Comment: (NOTE) Performed At: [facility] [address] [place], [place] [id number] Rush Farmer MD [id number]   Protein S, total     Status: None   Collection Time: 04/12/20  3:29 PM  Result Value Ref Range   Protein S Ag, Total 79 60 - 150 %    Comment: (NOTE) This test was developed and its performance characteristics determined by [facility]. It has not been cleared or approved by the Food and Drug Administration. Performed At: [facility] [place], [place] [id number] Rush Farmer MD [id number]   Factor 5 leiden     Status: None   Collection Time: 04/12/20  3:29 PM  Result Value Ref Range   Recommendations-F5LEID: Comment     Comment: (NOTE) Result:  Negative (no mutation found) Factor V Leiden is a specific mutation (R506Q) in  the factor V gene that is associated with an increased risk of venous thrombosis. Factor V Leiden is more resistant to inactivation by activated protein C.  As a  result, factor V persists in the circulation leading to a mild hyper- coagulable state.  The Leiden mutation accounts for 90% - 95% of APC resistance.  Factor V Leiden has been reported in patients with deep vein thrombosis, pulmonary embolus, central retinal vein occlusion, cerebral sinus thrombosis and hepatic vein thrombosis. Other risk factors to be considered in the workup for venous thrombosis include the G20210A mutation in the factor II (prothrombin) gene, protein S and C deficiency, and antithrombin deficiencies. Anticardiolipin antibody and lupus anticoagulant analysis may be appropriate for certain patients, as well as homocysteine levels. Contact your local [facility] for information on how to order additi onal testing if desired. **Genetic counselors are available for health care providers to**  discuss results at 1-800-345-GENE [phone]). Methodology: DNA analysis of the Factor V gene was performed by allele-specific PCR. The diagnostic sensitivity and specificity is >99% for both. Molecular-based testing is highly accurate, but as in any laboratory test, diagnostic errors may occur. All test results must be combined with clinical information for the most accurate interpretation. This test was developed and its performance characteristics determined by [facility]. It has not been cleared or approved by the Food and Drug Administration. References: Voelkerding K (1996).  Clin Lab Med [phone]. Allison Quarry, PhD, [facility] Ruben Reason, PhD, [facility] Ileene Hutchinson, PhD, [facility] Alfredo Bach, PhD, [facility] Norva Riffle, PhD, [facility] Earlean Polka PhD, [facility] Performed At: [facility] [place], [place] [id number] Katina Degree MDPhD [id number]   Prothrombin gene mutation     Status: None   Collection Time: 04/12/20  3:29 PM  Result Value Ref Range   Recommendations-PTGENE: Comment     Comment: (NOTE) NEGATIVE No mutation identified. Comment: A  point mutation (G20210A) in the factor II (prothrombin) gene is the second most common cause of inherited thrombophilia. The incidence of this mutation in the [place] Caucasian population is about 2% and in the [place] American population it is approximately 0.5%. This mutation is rare in the [place] and Native American population. Being heterozygous for a prothrombin mutation increases the risk for developing venous thrombosis about 2 to 3 times above the general population risk. Being homozygous for the prothrombin gene mutation increases the relative risk for venous thrombosis further, although it is not yet known how much further the risk is increased. In women heterozygous for the prothrombin gene mutation, the use of estrogen containing oral contraceptives increases the relative risk of venous thrombosis about 16 times and the risk of developing cerebral thrombosis is also significantly increased. In pregnancy the pr othrombin gene mutation increases risk for venous thrombosis and may increase risk for stillbirth, placental abruption, pre-eclampsia and fetal growth restriction. If the patient possesses two or more congenital or acquired thrombophilic risk factors, the risk for thrombosis may rise to more than the sum of the risk ratios for the individual mutations. This assay detects only the prothrombin G20210A mutation and does not measure genetic abnormalities elsewhere in the genome. Other thrombotic risk factors may be pursued through systematic clinical laboratory analysis. These factors include the R506Q (Leiden) mutation in the Factor V gene, plasma homocysteine levels, as well as testing for deficiencies of antithrombin III, protein C and protein S. Genetic Counselors are available for health care providers to discuss results at 1-800-345-GENE [phone]). Methodology: DNA analysis of  the Factor II gene was performed by PCR amplification followed by restriction analysis. The di  agnostic sensitivity is >99% for both. All the tests must be combined with clinical information for the most accurate interpretation. Molecular-based testing is highly accurate, but as in any laboratory test, diagnostic errors may occur. This test was developed and its performance characteristics determined by [facility]. It has not been cleared or approved by the Food and Drug Administration. Poort SR, et al. Blood. 1996; 74:[phone]. Varga EA. Circulation. 2004; 720:N47-S96. Mervin Hack, et [place]; 19:700-703. Allison Quarry, PhD, [facility] Ruben Reason, PhD, [facility] Ileene Hutchinson, PhD, [facility] Alfredo Bach, PhD, [facility] Norva Riffle, PhD, [facility] Earlean Polka, PhD, [facility] Performed At: [facility] [address] [place], [place] [id number] Katina Degree MDPhD [id number]   Fibrin derivatives D-Dimer [facility] only)     Status: Abnormal   Collection Time: 04/12/20  3:29 PM  Result Value Ref Range   Fibrin derivatives D-dimer ([facility]) 731.27 (H) 0.00 - 499.00 ng/mL (FEU)    Comment: (NOTE) <> Exclusion of Venous Thromboembolism (VTE) - OUTPATIENT ONLY   (Emergency Department or [place])    0-499 ng/ml (FEU): With a low to intermediate pretest probability                      for VTE this test result excludes the diagnosis                      of VTE.   >499 ng/ml (FEU) : VTE not excluded; additional work up for VTE is                      required.  <> Testing on Inpatients and Evaluation of Disseminated Intravascular   Coagulation (DIC) Reference Range:   0-499 ng/ml (FEU) Performed at [facility], [address] [place], [place] [address]   Comprehensive metabolic panel     Status: Abnormal   Collection Time: 04/12/20  3:29 PM  Result Value Ref Range   Sodium 133 (L) 135 - 145 mmol/L   Potassium 3.9 3.5 - 5.1 mmol/L   Chloride 97 (L) 98 - 111 mmol/L   CO2 28 22 - 32 mmol/L   Glucose, Bld 99 70 - 99 mg/dL    Comment: Glucose  reference range applies only to samples taken after fasting for at least 8 hours.   BUN 12 8 - 23 mg/dL   Creatinine, Ser 1.34 (H) 0.44 - 1.00 mg/dL   Calcium 8.1 (L) 8.9 - 10.3 mg/dL   Total Protein 5.5 (L) 6.5 - 8.1 g/dL   Albumin 3.0 (L) 3.5 - 5.0 g/dL   AST 17 15 - 41 U/L   ALT 15 0 - 44 U/L   Alkaline Phosphatase 128 (H) 38 - 126 U/L   Total Bilirubin 0.9 0.3 - 1.2 mg/dL   GFR calc non Af Amer 39 (L) >60 mL/min   GFR calc Af Amer 45 (L) >60 mL/min   Anion gap 8 5 - 15    Comment: Performed at [facility], [address] [place], [place] [address]  CBC with Differential/Platelet     Status: Abnormal   Collection Time: 04/12/20  3:29 PM  Result Value Ref Range   WBC 4.6 4.0 - 10.5 K/uL   RBC 2.86 (L) 3.87 - 5.11 MIL/uL   Hemoglobin 9.7 (L) 12.0 - 15.0 g/dL   HCT 27.4 (  L) 36 - 46 %   MCV 95.8 80.0 - 100.0 fL   MCH 33.9 26.0 - 34.0 pg   MCHC 35.4 30.0 - 36.0 g/dL   RDW 16.2 (H) 11.5 - 15.5 %   Platelets 250 150 - 400 K/uL   nRBC 0.0 0.0 - 0.2 %   Neutrophils Relative % 63 %   Neutro Abs 2.8 1.7 - 7.7 K/uL   Lymphocytes Relative 28 %   Lymphs Abs 1.3 0.7 - 4.0 K/uL   Monocytes Relative 7 %   Monocytes Absolute 0.3 0 - 1 K/uL   Eosinophils Relative 2 %   Eosinophils Absolute 0.1 0 - 0 K/uL   Basophils Relative 0 %   Basophils Absolute 0.0 0 - 0 K/uL   Immature Granulocytes 0 %   Abs Immature Granulocytes 0.01 0.00 - 0.07 K/uL    Comment: Performed at [facility], [address] [place], [place] [address]  dRVVT Mix     Status: Abnormal   Collection Time: 04/12/20  3:29 PM  Result Value Ref Range   dRVVT Mix 63.6 (H) 0.0 - 40.4 sec    Comment: (NOTE) Performed At: [facility] [place], [place] [id number] Rush Farmer MD [id number]   dRVVT Confirm     Status: None   Collection Time: 04/12/20  3:29 PM  Result Value Ref Range   dRVVT Confirm 1.0 0.8 - 1.2 ratio    Comment: (NOTE) Performed At: [facility] [place], [place] [id number] Rush Farmer MD [id number]     Radiology No results found.  Assessment/Plan  Abdominal aortic aneurysm (AAA) ([place]) Had an ultrasound done about a year and a half ago which showed a stable aortic size of about 2.4 to 2.5 cm.  This was saccular in appearance on the CT scan back 4 years ago.  This can be checked every 2 years at this point I think as it has been stable.  As such, this can be checked at her next follow-up visit in 3 to 4 months which would be almost 2 years from her last ultrasound.  Essential (primary) hypertension blood pressure control important in reducing the progression of atherosclerotic disease and aneurysmal growth. On appropriate oral medications.   Acute deep vein thrombosis (DVT) of tibial vein of left lower extremity (HCC) The patient has an acute calf vein DVT of the left lower extremity.  I will plan a 70-monthcourse of anticoagulation with Eliquis and a new prescription was given for that today and sent to the pharmacy.  She should get compression stockings and begin wearing these daily.  She should elevate her legs and be as active as possible.  I will plan to see her back in 3 months in follow-up to follow both the DVT and her aneurysm.  She will contact our office with any problems in the interim.    JLeotis Pain MD  04/23/2020 6:14 PM    This note was created with Dragon medical transcription system.  Any errors from dictation are purely unintentional

## 2020-04-23 NOTE — Patient Instructions (Signed)

## 2020-04-23 NOTE — Telephone Encounter (Signed)
Telephone visit scheduled for 04/30/2020 at 1pm  Thanks,   -[name]

## 2020-04-23 NOTE — Telephone Encounter (Signed)
Apt for 04/30/2020 at 1pm  Thanks,   -[name]

## 2020-04-23 NOTE — Assessment & Plan Note (Signed)
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  I will plan a 25-month course of anticoagulation with Eliquis and a new prescription was given for that today and sent to the pharmacy.  She should get compression stockings and begin wearing these daily.  She should elevate her legs and be as active as possible.  I will plan to see her back in 3 months in follow-up to follow both the DVT and her aneurysm.  She will contact our office with any problems in the interim.

## 2020-04-23 NOTE — Telephone Encounter (Signed)
Patients emergency contact and caregiver [name] came into office today stating that she had not heard back from Dr. Brita Romp in regards to patients well being with dementia. I advised her that numerous calls had been made to reach back out to her, she would like to discuss with Dr. Brita Romp about patients behavior and financial troubles before patients appointment on the 18th. I advised her that providers response to message was to schedule a virtual appointment. There is no openings on Dr. Coralee Rud schedule prior to the 18th. [name] is asking for Dr. Walker Kehr to give her a call back on her phone number at [phone]. KW

## 2020-04-23 NOTE — Assessment & Plan Note (Addendum)
blood pressure control important in reducing the progression of atherosclerotic disease and aneurysmal growth. On appropriate oral medications.  

## 2020-04-24 ENCOUNTER — Ambulatory Visit
Admission: RE | Admit: 2020-04-24 | Discharge: 2020-04-24 | Disposition: A | Discharge status: Home / Self Care | Payer: PPO | Source: Ambulatory Visit | Attending: Internal Medicine | Admitting: Internal Medicine

## 2020-04-24 DIAGNOSIS — D171 Benign lipomatous neoplasm of skin and subcutaneous tissue of trunk: Secondary | ICD-10-CM | POA: Diagnosis not present

## 2020-04-24 DIAGNOSIS — S32511A Fracture of superior rim of right pubis, initial encounter for closed fracture: Secondary | ICD-10-CM | POA: Diagnosis not present

## 2020-04-24 DIAGNOSIS — I700 Atherosclerosis of aorta: Secondary | ICD-10-CM | POA: Insufficient documentation

## 2020-04-24 DIAGNOSIS — R634 Abnormal weight loss: Secondary | ICD-10-CM | POA: Diagnosis not present

## 2020-04-24 DIAGNOSIS — J439 Emphysema, unspecified: Secondary | ICD-10-CM | POA: Diagnosis not present

## 2020-04-24 DIAGNOSIS — I82442 Acute embolism and thrombosis of left tibial vein: Secondary | ICD-10-CM

## 2020-04-24 DIAGNOSIS — S32591A Other specified fracture of right pubis, initial encounter for closed fracture: Secondary | ICD-10-CM | POA: Diagnosis not present

## 2020-04-24 MED ORDER — IOHEXOL 300 MG/ML  SOLN
100.0000 mL | Freq: Once | INTRAMUSCULAR | Status: AC | PRN
  Administered 2020-04-24: 75 mL via INTRAVENOUS

## 2020-04-30 ENCOUNTER — Telehealth (INDEPENDENT_AMBULATORY_CARE_PROVIDER_SITE_OTHER): Payer: PPO | Admitting: Family Medicine

## 2020-04-30 ENCOUNTER — Telehealth: Payer: Self-pay

## 2020-04-30 ENCOUNTER — Encounter: Payer: Self-pay | Admitting: Internal Medicine

## 2020-04-30 ENCOUNTER — Other Ambulatory Visit: Payer: Self-pay

## 2020-04-30 ENCOUNTER — Encounter: Payer: Self-pay | Admitting: Family Medicine

## 2020-04-30 ENCOUNTER — Inpatient Hospital Stay: Payer: PPO | Attending: Internal Medicine | Admitting: Internal Medicine

## 2020-04-30 VITALS — BP 111/59 | HR 62 | Temp 98.3°F | Resp 16 | Ht 61.0 in | Wt 127.0 lb

## 2020-04-30 DIAGNOSIS — R918 Other nonspecific abnormal finding of lung field: Secondary | ICD-10-CM | POA: Diagnosis not present

## 2020-04-30 DIAGNOSIS — I10 Essential (primary) hypertension: Secondary | ICD-10-CM | POA: Insufficient documentation

## 2020-04-30 DIAGNOSIS — I714 Abdominal aortic aneurysm, without rupture: Secondary | ICD-10-CM | POA: Insufficient documentation

## 2020-04-30 DIAGNOSIS — K219 Gastro-esophageal reflux disease without esophagitis: Secondary | ICD-10-CM | POA: Insufficient documentation

## 2020-04-30 DIAGNOSIS — Z79899 Other long term (current) drug therapy: Secondary | ICD-10-CM | POA: Insufficient documentation

## 2020-04-30 DIAGNOSIS — R634 Abnormal weight loss: Secondary | ICD-10-CM | POA: Insufficient documentation

## 2020-04-30 DIAGNOSIS — I82442 Acute embolism and thrombosis of left tibial vein: Secondary | ICD-10-CM

## 2020-04-30 DIAGNOSIS — F0390 Unspecified dementia without behavioral disturbance: Secondary | ICD-10-CM | POA: Insufficient documentation

## 2020-04-30 DIAGNOSIS — E785 Hyperlipidemia, unspecified: Secondary | ICD-10-CM | POA: Insufficient documentation

## 2020-04-30 DIAGNOSIS — R413 Other amnesia: Secondary | ICD-10-CM | POA: Diagnosis not present

## 2020-04-30 DIAGNOSIS — Z7982 Long term (current) use of aspirin: Secondary | ICD-10-CM | POA: Insufficient documentation

## 2020-04-30 DIAGNOSIS — J449 Chronic obstructive pulmonary disease, unspecified: Secondary | ICD-10-CM | POA: Insufficient documentation

## 2020-04-30 DIAGNOSIS — L4050 Arthropathic psoriasis, unspecified: Secondary | ICD-10-CM | POA: Insufficient documentation

## 2020-04-30 DIAGNOSIS — F17210 Nicotine dependence, cigarettes, uncomplicated: Secondary | ICD-10-CM | POA: Diagnosis not present

## 2020-04-30 DIAGNOSIS — Z7901 Long term (current) use of anticoagulants: Secondary | ICD-10-CM | POA: Insufficient documentation

## 2020-04-30 NOTE — Telephone Encounter (Signed)
Copied from [place] [phone]. Topic: General - Inquiry >> Apr 30, 2020  1:37 PM Scherrie Gerlach wrote: Reason for CRM: pt called at 1:35 pm stating she was returning Dr B calls. I advised pt it was a virtual visit.  She said no. This appt was changed to 10/18. She said she was shocked when she got Dr B message, because it is so rare when the dr herself calls you.  I did not dispute with pt anymore. As she is convinced this was not a virtual appt.  But a personal call from Dr B to see how she was getting along on a new med. She say Dr B can call her back at her convenience.

## 2020-04-30 NOTE — Assessment & Plan Note (Addendum)
#   Aug 31st 2021 Occlusive thrombus is seen in the left posterior tibial vein; unprovoked.  Currently on Eliquis.  Improving.  In general recommend Eliquis for total of 3 months; would defer to Dr.Dew.  Patient has upcoming appointment with Dr. Lucky Cowboy in 3 months.  Hypercoagulable work-up reviewed-overall negative.  #Unintentional weight loss-unclear etiology-dementia versus others-CT C/A/P- NEG; see discussion below regarding lung nodules  #Bilateral lung nodules- recommend follow up CT scan in 6 months; declines smoking cesation  I spoke at length with the patient's niece regarding the patient's clinical status/plan of care.  Family agreement.  # DISPOSITION:  # follow up in 6 months- MD; labs-cbc/cmp;CT chest prior-  Dr.B

## 2020-04-30 NOTE — Progress Notes (Signed)
[facility] NOTE  Patient Care Team: Bacigalupo, Dionne Bucy, MD as PCP - General (Family Medicine) Marry Guan, Laurice Record, MD as Consulting Physician (Orthopedic Surgery) Algernon Huxley, MD as Referring Physician (Vascular Surgery) Marlowe Sax, MD as Referring Physician (Internal Medicine)  CHIEF COMPLAINTS/PURPOSE OF CONSULTATION: DVT/PE  #  aug 31st 2021 Occlusive thrombus is seen in the left posterior tibial vein; dr.Dew- October 2021-  # AAA-surveillance Dr.Dew  # Dementia-early on aricept; Psoriatic arthritis- Cocentriq; active smoker-declines cessation  Oncology History   No history exists.     HISTORY OF PRESENTING ILLNESS:  Mariah [name] 74 y.o.  female history of DVT below the knee currently on Eliquis is here for follow-up/and review of CT scan for unexplained weight loss.  Patient interim has been evaluated by Dr. Lucky Cowboy for her left lower extremity DVT and also abdominal aortic aneurysm.  Patient has not any falls.  Has not had any blood in stools or black-colored stools.  Review of Systems  Constitutional: Positive for malaise/fatigue. Negative for chills, diaphoresis, fever and weight loss.  HENT: Negative for nosebleeds and sore throat.   Eyes: Negative for double vision.  Respiratory: Negative for cough, hemoptysis, sputum production, shortness of breath and wheezing.   Cardiovascular: Positive for leg swelling. Negative for chest pain, palpitations and orthopnea.  Gastrointestinal: Negative for abdominal pain, blood in stool, constipation, diarrhea, heartburn, melena, nausea and vomiting.  Genitourinary: Negative for dysuria, frequency and urgency.  Musculoskeletal: Negative for back pain and joint pain.  Skin: Negative.  Negative for itching and rash.  Neurological: Negative for dizziness, tingling, focal weakness, weakness and headaches.  Endo/Heme/Allergies: Does not bruise/bleed easily.  Psychiatric/Behavioral: Negative for depression.  The patient is not nervous/anxious and does not have insomnia.      MEDICAL HISTORY:  Past Medical History:  Diagnosis Date   Abdominal aortic aneurysm (AAA) (HCC)    Arthritis    COPD (chronic obstructive pulmonary disease) (HCC)    GERD (gastroesophageal reflux disease)    Headache    sinus   History of adenomatous polyp of colon    Hyperlipidemia    Hypertension    Psoriasis    Right lumbar radiculitis    Vitamin D deficiency     SURGICAL HISTORY: Past Surgical History:  Procedure Laterality Date   APPENDECTOMY  2000   BREAST BIOPSY Right 09/2016   radial scar;COMPLEX SCLEROSING LESION   BREAST LUMPECTOMY Right 11/24/2016   COMPLEX SCLEROSING LESION   BREAST LUMPECTOMY WITH NEEDLE LOCALIZATION Right 11/24/2016   Procedure: BREAST LUMPECTOMY WITH NEEDLE LOCALIZATION;  Surgeon: Leonie Green, MD;  Location: [facility];  Service: General;  Laterality: Right;   COLONOSCOPY WITH PROPOFOL N/A 08/10/2016   Procedure: COLONOSCOPY WITH PROPOFOL;  Surgeon: Manya Silvas, MD;  Location: [facility];  Service: Endoscopy;  Laterality: N/A;   ESOPHAGOGASTRODUODENOSCOPY (EGD) WITH PROPOFOL N/A 08/10/2016   Procedure: ESOPHAGOGASTRODUODENOSCOPY (EGD) WITH PROPOFOL;  Surgeon: Manya Silvas, MD;  Location: [facility];  Service: Endoscopy;  Laterality: N/A;   FOOT SURGERY Bilateral    JOINT REPLACEMENT Right 01/27/2016   hip   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   TOTAL HIP ARTHROPLASTY Right 01/27/2016   Procedure: TOTAL HIP ARTHROPLASTY;  Surgeon: Dereck Leep, MD;  Location: [facility];  Service: Orthopedics;  Laterality: Right;    SOCIAL HISTORY: Social History   Socioeconomic History   Marital status: Divorced    Spouse name: Not on file   Number of children: 0  Years of education: 15   Highest education level: Associate degree: occupational, Hotel manager, or vocational program  Occupational History    Comment: retired  Tobacco Use   Smoking  status: Current Every Day Smoker    Packs/day: 1.00    Years: 0.00    Pack years: 0.00   Smokeless tobacco: Never Used  Scientific laboratory technician Use: Former  Substance and Sexual Activity   Alcohol use: Yes    Comment: occasionally glass of wine   Drug use: No   Sexual activity: Not on file  Other Topics Concern   Not on file  Social History Narrative   Lives self; in [place]; smoke 1ppd; no alcohol. 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    Social Determinants of Health   Financial Resource Strain: Medium Risk   Difficulty of Paying Living Expenses: Somewhat hard  Food Insecurity: No Food Insecurity   Worried About Charity fundraiser in the Last Year: Never true   Ran Out of Food in the Last Year: Never true  Transportation Needs: No Transportation Needs   Lack of Transportation (Medical): No   Lack of Transportation (Non-Medical): No  Physical Activity: Inactive   Days of Exercise per Week: 0 days   Minutes of Exercise per Session: 0 min  Stress: No Stress Concern Present   Feeling of Stress : Not at all  Social Connections: Moderately Integrated   Frequency of Communication with Friends and Family: More than three times a week   Frequency of Social Gatherings with Friends and Family: Twice a week   Attends Religious Services: More than 4 times per year   Active Member of [employer] or Organizations: Yes   Attends Music therapist: More than 4 times per year   Marital Status: Divorced  Human resources officer Violence: Not At Risk   Fear of Current or Ex-Partner: No   Emotionally Abused: No   Physically Abused: No   Sexually Abused: No    FAMILY HISTORY: Family History  Problem Relation Age of Onset   Cancer Mother        kidney   Arthritis Father    Heart disease Father    COPD Sister    Fibromyalgia Sister    Osteoarthritis Sister    Osteoarthritis Maternal Aunt    Colon cancer Paternal Aunt    Colon  cancer Paternal Uncle    Colon cancer Paternal Grandmother    Breast cancer Neg Hx     ALLERGIES:  is allergic to clindamycin/lincomycin and amoxicillin.  MEDICATIONS:  Current Outpatient Medications  Medication Sig Dispense Refill   albuterol (PROVENTIL HFA;VENTOLIN HFA) 108 (90 Base) MCG/ACT inhaler INHALE 1-2 PUFFS INTO THE LUNGS EVERY 6 HOURS AS NEEDED FOR WHEEZING OR SHORTNESS OF BREATH. 8.5 Inhaler 5   apixaban (ELIQUIS) 5 MG TABS tablet Take 1 tablet (5 mg total) by mouth 2 (two) times daily. 60 tablet 3   Apremilast (OTEZLA) 30 MG TABS Take by mouth in the morning and at bedtime.     aspirin EC 81 MG tablet Take 81 mg by mouth daily. Swallow whole.     Cholecalciferol (VITAMIN D3) 2000 units TABS Take 2,000 Units by mouth daily.     Cholestyramine POWD Take 1 Package by mouth as needed. 60 g 5   COSENTYX SENSOREADY, 300 MG, 150 MG/ML SOAJ Inject 2 Syringes into the skin every 28 (twenty-eight) days.      diphenhydrAMINE (BENADRYL) 25 MG tablet Take 25  mg by mouth at bedtime as needed.     gabapentin (NEURONTIN) 300 MG capsule TAKE 1 CAPSULE BY MOUTH TWICE A DAY 180 capsule 1   hydrochlorothiazide (HYDRODIURIL) 12.5 MG tablet Take 12.5 mg by mouth daily.     levocetirizine (XYZAL) 5 MG tablet TAKE 1 TABLET BY MOUTH EVERY DAY IN THE EVENING 90 tablet 4   metoprolol succinate (TOPROL-XL) 50 MG 24 hr tablet TAKE 1 TABLET (50 MG TOTAL) BY MOUTH DAILY. TAKE WITH OR IMMEDIATELY FOLLOWING A MEAL. 90 tablet 0   pantoprazole (PROTONIX) 20 MG tablet TAKE 1 TABLET (20 MG TOTAL) BY MOUTH 2 (TWO) TIMES DAILY BEFORE A MEAL. 180 tablet 1   polyethylene glycol powder (GLYCOLAX/MIRALAX) powder Take 17 g by mouth 2 (two) times daily as needed. 3350 g 1   rOPINIRole (REQUIP) 1 MG tablet TAKE 1 TABLET (1 MG TOTAL) BY MOUTH AT BEDTIME. 90 tablet 1   simvastatin (ZOCOR) 20 MG tablet TAKE 1 TABLET BY MOUTH EVERY DAY 90 tablet 3   traMADol (ULTRAM) 50 MG tablet TAKE 1 TABLET BY MOUTH  EVERY 6 HOURS AS NEEDED FOR MODERATE OR SEVERE PAIN 100 tablet 2   betamethasone dipropionate (DIPROLENE) 0.05 % ointment Apply topically daily. (Patient not taking: Reported on 04/23/2020) 45 g 0   calcipotriene (DOVONOX) 0.005 % cream APPLY TO SKIN ONCE A DAY APPLY DAILY ON HANDS, FEET AND WRIST X 2 WEEKS, THEN USE 5 DAYS A WEEK (Patient not taking: Reported on 04/23/2020) 60 g 0   Calcipotriene 0.005 % solution PLEASE SEE ATTACHED FOR DETAILED DIRECTIONS (Patient not taking: Reported on 04/23/2020)     clobetasol (TEMOVATE) 0.05 % external solution APPLY TO AFFECTED AREA TWICE A DAY (Patient not taking: Reported on 04/23/2020) 50 mL 2   Clobetasol Propionate 0.05 % shampoo SHAMPOO WITH SMALL AMOUNT TO SKIN ONCE A DAY,SHAMPOO SCALP ONCE DAILY FOR 2 WEEKS THEN 5 DAYS A WEEK (Patient not taking: Reported on 04/23/2020) 118 mL 0   donepezil (ARICEPT) 5 MG tablet Take 5 mg by mouth at bedtime. (Patient not taking: Reported on 04/23/2020)     fluticasone (FLONASE) 50 MCG/ACT nasal spray Place 2 sprays into both nostrils daily. (Patient not taking: Reported on 37/02/5884) 16 g 6   folic acid (FOLVITE) 1 MG tablet Take 1 mg by mouth daily. (Patient not taking: Reported on 04/23/2020)     Secukinumab (COSENTYX SENSOREADY PEN) 150 MG/ML SOAJ Inject into the skin. (Patient not taking: Reported on 04/23/2020)     No current facility-administered medications for this visit.      [name]  PHYSICAL EXAMINATION:  Vitals:   04/30/20 1541  BP: (!) 111/59  Pulse: 62  Resp: 16  Temp: 98.3 F (36.8 C)  SpO2: 98%   Filed Weights   04/30/20 1541  Weight: 127 lb (57.6 kg)    Physical Exam Constitutional:      Comments: Frail appearing. With niece. Walking with a cane.   HENT:     Head: Normocephalic and atraumatic.     Mouth/Throat:     Pharynx: No oropharyngeal exudate.  Eyes:     Pupils: Pupils are equal, round, and reactive to light.  Cardiovascular:     Rate and Rhythm: Normal rate and regular  rhythm.  Pulmonary:     Effort: Pulmonary effort is normal. No respiratory distress.     Breath sounds: Normal breath sounds. No wheezing.  Abdominal:     General: Bowel sounds are normal. There is no distension.  Palpations: Abdomen is soft. There is no mass.     Tenderness: There is no abdominal tenderness. There is no guarding or rebound.  Musculoskeletal:        General: No tenderness. Normal range of motion.     Cervical back: Normal range of motion and neck supple.     Comments: Left leg swollen/tender.   Skin:    General: Skin is warm.  Neurological:     Mental Status: She is alert and oriented to person, place, and time.  Psychiatric:        Mood and Affect: Affect normal.      LABORATORY DATA:  I have reviewed the data as listed Lab Results  Component Value Date   WBC 4.6 04/12/2020   HGB 9.7 (L) 04/12/2020   HCT 27.4 (L) 04/12/2020   MCV 95.8 04/12/2020   PLT 250 04/12/2020   Recent Labs    09/14/19 1205 03/20/20 1351 04/12/20 1529  NA 140 139 133*  K 3.5 3.1* 3.9  CL 97 97 97*  CO2 25 29 28   GLUCOSE 79 57* 99  BUN 7* 9 12  CREATININE 1.28* 1.19* 1.34*  CALCIUM 9.7 8.3* 8.1*  GFRNONAA 41* 45* 39*  GFRAA 47* 52* 45*  PROT 6.2 4.9* 5.5*  ALBUMIN 3.9 2.9* 3.0*  AST 21 15 17   ALT 11 12 15   ALKPHOS 151* 240* 128*  BILITOT 0.7 0.6 0.9  BILIDIR 0.25  --   --     RADIOGRAPHIC STUDIES: I have personally reviewed the radiological images as listed and agreed with the findings in the report. CT CHEST ABDOMEN PELVIS W CONTRAST  Result Date: 04/25/2020 CLINICAL DATA:  Unintentional weight loss. EXAM: CT CHEST, ABDOMEN, AND PELVIS WITH CONTRAST TECHNIQUE: Multidetector CT imaging of the chest, abdomen and pelvis was performed following the standard protocol during bolus administration of intravenous contrast. CONTRAST:  89mL OMNIPAQUE IOHEXOL 300 MG/ML  SOLN COMPARISON:  Abdomen CT 04/15/2016. FINDINGS: CT CHEST FINDINGS Cardiovascular: The heart size is  normal. No substantial pericardial effusion. Coronary artery calcification is evident. Atherosclerotic calcification is noted in the wall of the thoracic aorta. Mediastinum/Nodes: No mediastinal lymphadenopathy. There is no hilar lymphadenopathy. No esophageal wall thickening. Residual contrast material in the esophageal lumen may be related to dysmotility or reflux. There is no hilar lymphadenopathy. Lungs/Pleura: Centrilobular emphsyema noted. 3 mm right upper lobe nodule identified on image 45/3. 3 mm anterior right upper lobe nodule evident on 79/3. 6 mm anterior right lower lobe nodule evident on 107/3. No focal airspace consolidation. No pleural effusion. Musculoskeletal: No worrisome lytic or sclerotic osseous abnormality. CT ABDOMEN PELVIS FINDINGS Hepatobiliary: 12 mm hypervascular lesion identified right liver on 61/2. this is stable since study from 04/15/2016 consistent with benign etiology such as flash filling hemangioma or vascular malformation. There is no evidence for gallstones, gallbladder wall thickening, or pericholecystic fluid. No intrahepatic or extrahepatic biliary dilation. Pancreas: No focal mass lesion. No dilatation of the main duct. No intraparenchymal cyst. No peripancreatic edema. Spleen: No splenomegaly. No focal mass lesion. Adrenals/Urinary Tract: No adrenal nodule or mass. Heterogeneous enhancement pattern to kidneys on delayed imaging is nonspecific but likely related to focal areas of cortical scarring. No evidence for hydroureter. Bladder largely obscured by beam hardening artifact from right hip replacement. Stomach/Bowel: Stomach is unremarkable. No gastric wall thickening. No evidence of outlet obstruction. Duodenum is normally positioned as is the ligament of Treitz. Small duodenal lipoma again noted. No small bowel wall thickening. No small bowel dilatation.  The terminal ileum is normal. The appendix is not visualized, but there is no edema or inflammation in the region of  the cecum. No gross colonic mass. No colonic wall thickening. Vascular/Lymphatic: There is abdominal aortic atherosclerosis without aneurysm. The portal vein, superior mesenteric vein and splenic vein are patent. There is no gastrohepatic or hepatoduodenal ligament lymphadenopathy. No retroperitoneal or mesenteric lymphadenopathy. No pelvic sidewall lymphadenopathy. Portions of the right pelvic sidewall obscured by beam hardening artifact. Reproductive: No left adnexal mass. Central anatomic pelvis and right adnexal region obscured by artifact. Other: No intraperitoneal free fluid. Musculoskeletal: No worrisome lytic or sclerotic osseous abnormality. Sclerosis identified in the right pubic bone extending into the anterior aspect of the superior and inferior pubic rami. There appears to be a nonacute pubic bone fracture with healed fracture of the inferior right pubic ramus. This appearance was not evident on hip x-rays of 03/01/2020 and is presumably posttraumatic with metastatic involvement considered less likely. Degenerative changes noted in the lower lumbar spine. IMPRESSION: 1. No definite findings to explain the patient's history of unintended weight loss. 2. Several tiny right pulmonary nodules measuring up to 6 mm. Non-contrast chest CT at 3-6 months is recommended. If the nodules are stable at time of repeat CT, then future CT at 18-24 months (from today's scan) is considered optional for low-risk patients, but is recommended for high-risk patients. This recommendation follows the consensus statement: Guidelines for Management of Incidental Pulmonary Nodules Detected on CT Images: From the Fleischner Society 2017; Radiology 2017; 284:228-243. 3. Sclerosis in the right pubic bone extending into the anterior aspect of the superior and inferior pubic rami associated with apparent right pubic ramus fracture and healed fracture of the inferior right pubic ramus. This appearance was not evident on hip x-rays of  03/01/2020 and is presumably posttraumatic with metastatic involvement considered much less likely. Attention on follow-up recommended. 4. Residual contrast material in the esophageal lumen suggests dysmotility or reflux. 5. Aortic Atherosclerosis (ICD10-I70.0) and Emphysema (ICD10-J43.9). Electronically Signed   By: Misty Stanley M.D.   On: 04/25/2020 07:06    ASSESSMENT & PLAN:   Acute deep vein thrombosis (DVT) of tibial vein of left lower extremity [facility]) # Aug 31st 2021 Occlusive thrombus is seen in the left posterior tibial vein; unprovoked.  Currently on Eliquis.  Improving.  In general recommend Eliquis for total of 3 months; would defer to Dr.Dew.  Patient has upcoming appointment with Dr. Lucky Cowboy in 3 months.  Hypercoagulable work-up reviewed-overall negative.  #Unintentional weight loss-unclear etiology-dementia versus others-CT C/A/P- NEG; see discussion below regarding lung nodules  #Bilateral lung nodules- recommend follow up CT scan in 6 months; declines smoking cesation  I spoke at length with the patient's niece regarding the patient's clinical status/plan of care.  Family agreement.  # DISPOSITION:  # follow up in 6 months- MD; labs-cbc/cmp;CT chest prior-  Dr.B  All questions were answered. The patient knows to call the clinic with any problems, questions or concerns.    Cammie Sickle, MD 04/30/2020 4:40 PM

## 2020-04-30 NOTE — Assessment & Plan Note (Signed)
Stable Seems to be worse than it presented as we have found that patient is not independent in IADLs, such as finances, cooking She is still driving and family is concerned about that Will report to [facility] She is taking aricept which we will continue Need to discuss home safety and possibility of not living independently at f/u next week Introduced concept to patient today Long discussion with her caregivers regarding dementia and caregiver support and independence

## 2020-04-30 NOTE — Progress Notes (Signed)
Virtual telephone visit    Virtual Visit via Telephone Note   This visit type was conducted due to national recommendations for restrictions regarding the COVID-19 Pandemic (e.g. social distancing) in an effort to limit this patient's exposure and mitigate transmission in our community. Due to her co-morbid illnesses, this patient is at least at moderate risk for complications without adequate follow up. This format is felt to be most appropriate for this patient at this time. The patient did not have access to video technology or had technical difficulties with video requiring transitioning to audio format only (telephone). Physical exam was limited to content and character of the telephone converstion.    Patient location: home Provider location: [place] involved in the visit: patient, provider and separately with patient's niece and BIL  I discussed the limitations of evaluation and management by telemedicine and the availability of in person appointments. The patient expressed understanding and agreed to proceed.    Visit Date: 04/30/2020  Today's healthcare provider: Lavon Paganini, MD   Chief Complaint  Patient presents with  . Dementia    Subjective    HPI    Niece discovered after last visit that patient is behind on all of her bills (house, electrical, etc).  The whole family is working to get her caught up.  She has previously told PCP and Neuro that she was independent in all of these things.  Her borther-in-law is now managing all of her finances.  She is over extended and bills are more than income.  Niece has asked her to move in with her, but patient is very hesitant and wants to remain independent.  Niece is making all meals  She is independent in ADLs, but she is dependent in her IADLs.  Niece is concerned about her continuing to drive.  She has never gotten lost, but she is more confused.  She did start taking her Aricept 5mg   qhs.  Social History   Tobacco Use  . Smoking status: Current Every Day Smoker    Packs/day: 1.00    Years: 0.00    Pack years: 0.00  . Smokeless tobacco: Never Used  Vaping Use  . Vaping Use: Former  Substance Use Topics  . Alcohol use: Yes    Comment: occasionally glass of wine  . Drug use: No     Medications: Outpatient Medications Prior to Visit  Medication Sig  . albuterol (PROVENTIL HFA;VENTOLIN HFA) 108 (90 Base) MCG/ACT inhaler INHALE 1-2 PUFFS INTO THE LUNGS EVERY 6 HOURS AS NEEDED FOR WHEEZING OR SHORTNESS OF BREATH.  [name] apixaban (ELIQUIS) 5 MG TABS tablet Take 1 tablet (5 mg total) by mouth 2 (two) times daily.  [name] Apremilast (OTEZLA) 30 MG TABS Take by mouth in the morning and at bedtime.  [name] aspirin EC 81 MG tablet Take 81 mg by mouth daily. Swallow whole.  . betamethasone dipropionate (DIPROLENE) 0.05 % ointment Apply topically daily. (Patient not taking: Reported on 04/23/2020)  . calcipotriene (DOVONOX) 0.005 % cream APPLY TO SKIN ONCE A DAY APPLY DAILY ON HANDS, FEET AND WRIST X 2 WEEKS, THEN USE 5 DAYS A WEEK (Patient not taking: Reported on 04/23/2020)  . Calcipotriene 0.005 % solution PLEASE SEE ATTACHED FOR DETAILED DIRECTIONS (Patient not taking: Reported on 04/23/2020)  . Cholecalciferol (VITAMIN D3) 2000 units TABS Take 2,000 Units by mouth daily.  . Cholestyramine POWD Take 1 Package by mouth as needed.  . clobetasol (TEMOVATE) 0.05 % external solution APPLY TO AFFECTED  AREA TWICE A DAY (Patient not taking: Reported on 04/23/2020)  . Clobetasol Propionate 0.05 % shampoo SHAMPOO WITH SMALL AMOUNT TO SKIN ONCE A DAY,SHAMPOO SCALP ONCE DAILY FOR 2 WEEKS THEN 5 DAYS A WEEK (Patient not taking: Reported on 04/23/2020)  . COSENTYX SENSOREADY, 300 MG, 150 MG/ML SOAJ Inject 2 Syringes into the skin every 28 (twenty-eight) days.   . diphenhydrAMINE (BENADRYL) 25 MG tablet Take 25 mg by mouth at bedtime as needed.  . donepezil (ARICEPT) 5 MG tablet Take 5 mg by mouth at  bedtime. (Patient not taking: Reported on 04/23/2020)  . fluticasone (FLONASE) 50 MCG/ACT nasal spray Place 2 sprays into both nostrils daily. (Patient not taking: Reported on 04/23/2020)  . folic acid (FOLVITE) 1 MG tablet Take 1 mg by mouth daily. (Patient not taking: Reported on 04/23/2020)  . gabapentin (NEURONTIN) 300 MG capsule TAKE 1 CAPSULE BY MOUTH TWICE A DAY  . hydrochlorothiazide (HYDRODIURIL) 12.5 MG tablet Take 12.5 mg by mouth daily.  [name] levocetirizine (XYZAL) 5 MG tablet TAKE 1 TABLET BY MOUTH EVERY DAY IN THE EVENING  . metoprolol succinate (TOPROL-XL) 50 MG 24 hr tablet TAKE 1 TABLET (50 MG TOTAL) BY MOUTH DAILY. TAKE WITH OR IMMEDIATELY FOLLOWING A MEAL.  . pantoprazole (PROTONIX) 20 MG tablet TAKE 1 TABLET (20 MG TOTAL) BY MOUTH 2 (TWO) TIMES DAILY BEFORE A MEAL.  [name] polyethylene glycol powder (GLYCOLAX/MIRALAX) powder Take 17 g by mouth 2 (two) times daily as needed.  [name] rOPINIRole (REQUIP) 1 MG tablet TAKE 1 TABLET (1 MG TOTAL) BY MOUTH AT BEDTIME.  . Secukinumab (COSENTYX SENSOREADY PEN) 150 MG/ML SOAJ Inject into the skin. (Patient not taking: Reported on 04/23/2020)  . simvastatin (ZOCOR) 20 MG tablet TAKE 1 TABLET BY MOUTH EVERY DAY  . traMADol (ULTRAM) 50 MG tablet TAKE 1 TABLET BY MOUTH EVERY 6 HOURS AS NEEDED FOR MODERATE OR SEVERE PAIN   No facility-administered medications prior to visit.    Review of Systems  Constitutional: Negative.   Respiratory: Negative.   Cardiovascular: Negative.   Neurological: Negative.   Psychiatric/Behavioral: Positive for confusion. Negative for hallucinations.      Objective    There were no vitals taken for this visit. BP Readings from Last 3 Encounters:  04/30/20 (!) 111/59  04/23/20 111/70  04/12/20 110/72   Wt Readings from Last 3 Encounters:  04/30/20 127 lb (57.6 kg)  04/23/20 125 lb (56.7 kg)  04/12/20 119 lb (54 kg)     Speaking in full sentences in NAD A&O x3   Assessment & Plan     Problem List Items  Addressed This Visit      Other   Memory loss - Primary    Stable Seems to be worse than it presented as we have found that patient is not independent in IADLs, such as finances, cooking She is still driving and family is concerned about that Will report to [facility] She is taking aricept which we will continue Need to discuss home safety and possibility of not living independently at f/u next week Introduced concept to patient today Long discussion with her caregivers regarding dementia and caregiver support and independence          Return in about 1 week (around 05/07/2020) for as scheduled.      I discussed the assessment and treatment plan with the patient. The patient was provided an opportunity to ask questions and all were answered. The patient agreed with the plan and demonstrated an understanding of the  instructions.   The patient was advised to call back or seek an in-person evaluation if the symptoms worsen or if the condition fails to improve as anticipated.  I provided 30 minutes of non-face-to-face time during this encounter.  I, Lavon Paganini, MD, have reviewed all documentation for this visit. The documentation on 04/30/20 for the exam, diagnosis, procedures, and orders are all accurate and complete.   Iran Rowe, Dionne Bucy, MD, MPH [facility]

## 2020-05-06 ENCOUNTER — Ambulatory Visit (INDEPENDENT_AMBULATORY_CARE_PROVIDER_SITE_OTHER): Payer: PPO | Admitting: Family Medicine

## 2020-05-06 ENCOUNTER — Other Ambulatory Visit: Payer: Self-pay

## 2020-05-06 VITALS — BP 123/84 | HR 66 | Temp 98.4°F | Resp 16 | Wt 124.4 lb

## 2020-05-06 DIAGNOSIS — I10 Essential (primary) hypertension: Secondary | ICD-10-CM

## 2020-05-06 DIAGNOSIS — R413 Other amnesia: Secondary | ICD-10-CM | POA: Diagnosis not present

## 2020-05-06 DIAGNOSIS — R634 Abnormal weight loss: Secondary | ICD-10-CM | POA: Diagnosis not present

## 2020-05-06 NOTE — Progress Notes (Signed)
Established patient visit   Patient: Mariah [name]   DOB: February 03, 1946   74 y.o. Female  MRN: [id number] Visit Date: 05/06/2020  Today's healthcare provider: Lavon Paganini, MD   Chief Complaint  Patient presents with  . Hypertension  . Weight Loss   Subjective    HPI  Hypertension, follow-up  BP Readings from Last 3 Encounters:  05/06/20 123/84  04/30/20 (!) 111/59  04/23/20 111/70   Wt Readings from Last 3 Encounters:  05/06/20 124 lb 6.4 oz (56.4 kg)  04/30/20 127 lb (57.6 kg)  04/23/20 125 lb (56.7 kg)     She was last seen for hypertension 1 months ago.  BP at that visit was 75/55. Management since that visit includes discontinue HCTZ and encourage hydration.  She reports excellent compliance with treatment. She is not having side effects.  She is following a Regular diet. She is not exercising. She does not smoke.  Use of agents associated with hypertension: none.   Outside blood pressures are being checked periodically. Symptoms: No chest pain No chest pressure  No palpitations No syncope  No dyspnea No orthopnea  No paroxysmal nocturnal dyspnea No lower extremity edema   Pertinent labs: Lab Results  Component Value Date   CHOL 163 08/08/2019   HDL 53 08/08/2019   LDLCALC 85 08/08/2019   TRIG 146 08/08/2019   CHOLHDL 3.1 08/08/2019   Lab Results  Component Value Date   NA 133 (L) 04/12/2020   K 3.9 04/12/2020   CREATININE 1.34 (H) 04/12/2020   GFRNONAA 39 (L) 04/12/2020   GFRAA 45 (L) 04/12/2020   GLUCOSE 99 04/12/2020     The 10-year ASCVD risk score Mikey Bussing DC Jr., et al., 2013) is: 27%   --------------------------------------------------------------------------------------------------- Follow up for weight loss  The patient was last seen for this 1 months ago. Changes made at last visit include none, patient advised to eat more frequently/regularly.  She reports excellent compliance with treatment. She feels that condition is  Improved. Patient states that she eats two meals a day and a snack.   ----------------------------------------------------------------------------------------- ADLs: Needs a little helping with getting into and out of the shower. Dresses independently, feeding independent, toileting independently  IADLs: Can do some minor cooking, but family is bringing food given weight loss. Driving independently within small radius from her home with no accidents or getting lost.  Brother-in-law has taken over finances as she was found to be behind on all of her bills (See concerns from last week's virtual visit).  She is currently living with her great nephew.  He does not help around the house and she ends up doing things for him.  His mother is concerned that he is rude to the patient and a burden on her in their current situation.  The family would like for the patient to move in with her niece, but patient is hesitant at this time.   Social History   Tobacco Use  . Smoking status: Current Every Day Smoker    Packs/day: 1.00    Years: 0.00    Pack years: 0.00  . Smokeless tobacco: Never Used  Vaping Use  . Vaping Use: Former  Substance Use Topics  . Alcohol use: Yes    Comment: occasionally glass of wine  . Drug use: No       Medications: Outpatient Medications Prior to Visit  Medication Sig  . albuterol (PROVENTIL HFA;VENTOLIN HFA) 108 (90 Base) MCG/ACT inhaler INHALE 1-2 PUFFS INTO THE LUNGS  EVERY 6 HOURS AS NEEDED FOR WHEEZING OR SHORTNESS OF BREATH.  [name] apixaban (ELIQUIS) 5 MG TABS tablet Take 1 tablet (5 mg total) by mouth 2 (two) times daily.  [name] Apremilast (OTEZLA) 30 MG TABS Take by mouth in the morning and at bedtime.  [name] aspirin EC 81 MG tablet Take 81 mg by mouth daily. Swallow whole.  . betamethasone dipropionate (DIPROLENE) 0.05 % ointment Apply topically daily.  . calcipotriene (DOVONOX) 0.005 % cream APPLY TO SKIN ONCE A DAY APPLY DAILY ON HANDS, FEET AND WRIST X 2 WEEKS, THEN  USE 5 DAYS A WEEK  . Calcipotriene 0.005 % solution PLEASE SEE ATTACHED FOR DETAILED DIRECTIONS  . Cholecalciferol (VITAMIN D3) 2000 units TABS Take 2,000 Units by mouth daily.  . Cholestyramine POWD Take 1 Package by mouth as needed.  . clobetasol (TEMOVATE) 0.05 % external solution APPLY TO AFFECTED AREA TWICE A DAY  . Clobetasol Propionate 0.05 % shampoo SHAMPOO WITH SMALL AMOUNT TO SKIN ONCE A DAY,SHAMPOO SCALP ONCE DAILY FOR 2 WEEKS THEN 5 DAYS A WEEK  . COSENTYX SENSOREADY, 300 MG, 150 MG/ML SOAJ Inject 2 Syringes into the skin every 28 (twenty-eight) days.   . diphenhydrAMINE (BENADRYL) 25 MG tablet Take 25 mg by mouth at bedtime as needed.  . fluticasone (FLONASE) 50 MCG/ACT nasal spray Place 2 sprays into both nostrils daily.  . folic acid (FOLVITE) 1 MG tablet Take 1 mg by mouth daily.   [name] gabapentin (NEURONTIN) 300 MG capsule TAKE 1 CAPSULE BY MOUTH TWICE A DAY  . hydrochlorothiazide (HYDRODIURIL) 12.5 MG tablet Take 12.5 mg by mouth daily.  [name] levocetirizine (XYZAL) 5 MG tablet TAKE 1 TABLET BY MOUTH EVERY DAY IN THE EVENING  . metoprolol succinate (TOPROL-XL) 50 MG 24 hr tablet TAKE 1 TABLET (50 MG TOTAL) BY MOUTH DAILY. TAKE WITH OR IMMEDIATELY FOLLOWING A MEAL.  . pantoprazole (PROTONIX) 20 MG tablet TAKE 1 TABLET (20 MG TOTAL) BY MOUTH 2 (TWO) TIMES DAILY BEFORE A MEAL.  [name] polyethylene glycol powder (GLYCOLAX/MIRALAX) powder Take 17 g by mouth 2 (two) times daily as needed.  [name] rOPINIRole (REQUIP) 1 MG tablet TAKE 1 TABLET (1 MG TOTAL) BY MOUTH AT BEDTIME.  . Secukinumab (COSENTYX SENSOREADY PEN) 150 MG/ML SOAJ Inject into the skin.   [name] simvastatin (ZOCOR) 20 MG tablet TAKE 1 TABLET BY MOUTH EVERY DAY  . traMADol (ULTRAM) 50 MG tablet TAKE 1 TABLET BY MOUTH EVERY 6 HOURS AS NEEDED FOR MODERATE OR SEVERE PAIN  . donepezil (ARICEPT) 5 MG tablet Take 5 mg by mouth at bedtime. (Patient not taking: Reported on 04/23/2020)   No facility-administered medications prior to visit.     Review of Systems  Constitutional: Negative.   Respiratory: Negative.   Cardiovascular: Negative.   Neurological: Negative.   Psychiatric/Behavioral: Negative.        Objective    BP 123/84   Pulse 66   Temp 98.4 F (36.9 C) (Oral)   Resp 16   Wt 124 lb 6.4 oz (56.4 kg)   BMI 23.51 kg/m  Wt Readings from Last 3 Encounters:  05/06/20 124 lb 6.4 oz (56.4 kg)  04/30/20 127 lb (57.6 kg)  04/23/20 125 lb (56.7 kg)      Physical Exam Vitals reviewed.  Constitutional:      General: She is not in acute distress.    Appearance: Normal appearance. She is well-developed. She is not diaphoretic.  HENT:     Head: Normocephalic and atraumatic.  Eyes:  General: No scleral icterus.    Conjunctiva/sclera: Conjunctivae normal.  Neck:     Thyroid: No thyromegaly.  Cardiovascular:     Rate and Rhythm: Normal rate and regular rhythm.     Pulses: Normal pulses.     Heart sounds: Normal heart sounds. No murmur heard.   Pulmonary:     Effort: Pulmonary effort is normal. No respiratory distress.     Breath sounds: Normal breath sounds. No wheezing, rhonchi or rales.  Musculoskeletal:     Cervical back: Neck supple.     Right lower leg: No edema.     Left lower leg: No edema.  Lymphadenopathy:     Cervical: No cervical adenopathy.  Skin:    General: Skin is warm and dry.     Findings: No rash.  Neurological:     Mental Status: She is alert. Mental status is at baseline.  Psychiatric:        Mood and Affect: Mood normal.        Behavior: Behavior normal.     No results found for any visits on 05/06/20.  Assessment & Plan     Problem List Items Addressed This Visit      Cardiovascular and Mediastinum   Essential (primary) hypertension - Primary    Now well controlled Hypotension from last visit has resolved with d/c of HCTZ No changes today As she is eating more consistently now, may need to resume a low dose antihypertensive in the future F/u in 3 months         Other   Unintentional weight loss    Has now stabilized and she has gained some of the weight back I suspect that this was likely related to her memory loss and needing more help than she has at home Now that family members are bringing in dinner every night, she is eating well and gaining some of her weight back We discussed that if she can just maintain at this weight, that would be appropriate, but if she gains more the weight back that would be fine as well      Memory loss    Stable, but seems to be worse than it presented as the patient has not been independent in her IADLs as she was previously reporting As discussed at last visit with her family, she has been reported to the [facility] and may need to have this signed off on by neurology before she continues to renew her license She is taking Aricept, which we will continue She denies any side effects from Aricept Today we spent a long time discussing home safety and possibility of living with her family member versus ALF Patient is hesitant at this time, but she is willing to consider We will continue to reassess Also encouraged her to speak to neurology at her follow-up appointment about this as well If needed, we could get CCM social worker involved to help with placement if that is deemed the best option for the patient and she is agreeable          Return in about 3 months (around 08/06/2020) for chronic disease f/u.       Total time spent on today's visit was greater than 45 minutes, including both face-to-face time and nonface-to-face time personally spent on review of chart (labs and imaging), discussing labs and goals, discussing further work-up, treatment options, referrals to specialist if needed, reviewing outside records of pertinent, answering patient's questions, and coordinating care.    I,  Lavon Paganini, MD, have reviewed all documentation for this visit. The documentation on 05/07/20 for the exam, diagnosis,  procedures, and orders are all accurate and complete.   Guida Asman, Dionne Bucy, MD, MPH [facility]

## 2020-05-07 ENCOUNTER — Encounter: Payer: Self-pay | Admitting: Family Medicine

## 2020-05-07 ENCOUNTER — Encounter (INDEPENDENT_AMBULATORY_CARE_PROVIDER_SITE_OTHER): Payer: PPO | Admitting: Vascular Surgery

## 2020-05-07 ENCOUNTER — Other Ambulatory Visit: Payer: Self-pay | Admitting: Family Medicine

## 2020-05-07 NOTE — Telephone Encounter (Signed)
Requested Prescriptions  Pending Prescriptions Disp Refills   gabapentin (NEURONTIN) 300 MG capsule [Pharmacy Med Name: GABAPENTIN 300 MG CAPSULE] 180 capsule 1    Sig: TAKE 1 CAPSULE BY MOUTH TWICE A DAY     Neurology: Anticonvulsants - gabapentin Passed - 05/07/2020 11:13 AM      Passed - Valid encounter within last 12 months    Recent Outpatient Visits          Yesterday Essential (primary) hypertension   [facility], Dionne Bucy, MD   1 week ago Memory loss   [facility], Dionne Bucy, MD   1 month ago Memory loss   [facility], Dionne Bucy, MD   1 month ago Acute deep vein thrombosis (DVT) of tibial vein of left lower extremity [facility]   [facility], Wendee Beavers, [place]   2 months ago Memory loss   [facility], Dionne Bucy, MD      Future Appointments            In 3 months Bacigalupo, Dionne Bucy, MD [facility], [place]

## 2020-05-07 NOTE — Assessment & Plan Note (Signed)
Now well controlled Hypotension from last visit has resolved with d/c of HCTZ No changes today As she is eating more consistently now, may need to resume a low dose antihypertensive in the future F/u in 3 months

## 2020-05-07 NOTE — Assessment & Plan Note (Signed)
Has now stabilized and she has gained some of the weight back I suspect that this was likely related to her memory loss and needing more help than she has at home Now that family members are bringing in dinner every night, she is eating well and gaining some of her weight back We discussed that if she can just maintain at this weight, that would be appropriate, but if she gains more the weight back that would be fine as well

## 2020-05-07 NOTE — Assessment & Plan Note (Signed)
Stable, but seems to be worse than it presented as the patient has not been independent in her IADLs as she was previously reporting As discussed at last visit with her family, she has been reported to the [facility] and may need to have this signed off on by neurology before she continues to renew her license She is taking Aricept, which we will continue She denies any side effects from Aricept Today we spent a long time discussing home safety and possibility of living with her family member versus ALF Patient is hesitant at this time, but she is willing to consider We will continue to reassess Also encouraged her to speak to neurology at her follow-up appointment about this as well If needed, we could get CCM social worker involved to help with placement if that is deemed the best option for the patient and she is agreeable

## 2020-05-09 ENCOUNTER — Telehealth: Payer: Self-pay

## 2020-05-09 NOTE — Telephone Encounter (Signed)
Copied from [place] [id number]. Topic: General - Other >> May 09, 2020  4:47 PM Yvette Rack wrote: Reason for CRM: Pt requests Rx for a cane. Cb# [phone]

## 2020-05-10 NOTE — Telephone Encounter (Signed)
We can do that, but I'm wondering if she would be amenable to home health PT coming to evaluate her and seeing if cane is the right mobility aid or if she would benefit from something different

## 2020-05-10 NOTE — Telephone Encounter (Signed)
She is fine with the prescription for cane (3 prong legs) and she will pick this up if someone can call when it is up front.

## 2020-05-13 NOTE — Telephone Encounter (Signed)
Rx written.

## 2020-05-19 ENCOUNTER — Emergency Department: Payer: PPO

## 2020-05-19 ENCOUNTER — Emergency Department
Admission: EM | Admit: 2020-05-19 | Discharge: 2020-05-19 | Disposition: A | Discharge status: Home / Self Care | Payer: PPO | Attending: Emergency Medicine | Admitting: Emergency Medicine

## 2020-05-19 ENCOUNTER — Other Ambulatory Visit: Payer: Self-pay

## 2020-05-19 DIAGNOSIS — Z96641 Presence of right artificial hip joint: Secondary | ICD-10-CM | POA: Diagnosis not present

## 2020-05-19 DIAGNOSIS — Z7951 Long term (current) use of inhaled steroids: Secondary | ICD-10-CM | POA: Insufficient documentation

## 2020-05-19 DIAGNOSIS — I10 Essential (primary) hypertension: Secondary | ICD-10-CM | POA: Diagnosis not present

## 2020-05-19 DIAGNOSIS — S0181XA Laceration without foreign body of other part of head, initial encounter: Secondary | ICD-10-CM | POA: Diagnosis not present

## 2020-05-19 DIAGNOSIS — R600 Localized edema: Secondary | ICD-10-CM | POA: Diagnosis not present

## 2020-05-19 DIAGNOSIS — S60946A Unspecified superficial injury of right little finger, initial encounter: Secondary | ICD-10-CM | POA: Diagnosis not present

## 2020-05-19 DIAGNOSIS — Z79899 Other long term (current) drug therapy: Secondary | ICD-10-CM | POA: Diagnosis not present

## 2020-05-19 DIAGNOSIS — W1842XA Slipping, tripping and stumbling without falling due to stepping into hole or opening, initial encounter: Secondary | ICD-10-CM | POA: Insufficient documentation

## 2020-05-19 DIAGNOSIS — F17200 Nicotine dependence, unspecified, uncomplicated: Secondary | ICD-10-CM | POA: Insufficient documentation

## 2020-05-19 DIAGNOSIS — Z7982 Long term (current) use of aspirin: Secondary | ICD-10-CM | POA: Diagnosis not present

## 2020-05-19 DIAGNOSIS — J449 Chronic obstructive pulmonary disease, unspecified: Secondary | ICD-10-CM | POA: Insufficient documentation

## 2020-05-19 DIAGNOSIS — Y92512 Supermarket, store or market as the place of occurrence of the external cause: Secondary | ICD-10-CM | POA: Diagnosis not present

## 2020-05-19 DIAGNOSIS — S199XXA Unspecified injury of neck, initial encounter: Secondary | ICD-10-CM | POA: Diagnosis not present

## 2020-05-19 DIAGNOSIS — G319 Degenerative disease of nervous system, unspecified: Secondary | ICD-10-CM | POA: Diagnosis not present

## 2020-05-19 DIAGNOSIS — W19XXXA Unspecified fall, initial encounter: Secondary | ICD-10-CM

## 2020-05-19 DIAGNOSIS — S6991XA Unspecified injury of right wrist, hand and finger(s), initial encounter: Secondary | ICD-10-CM

## 2020-05-19 DIAGNOSIS — Y9301 Activity, walking, marching and hiking: Secondary | ICD-10-CM | POA: Insufficient documentation

## 2020-05-19 DIAGNOSIS — I739 Peripheral vascular disease, unspecified: Secondary | ICD-10-CM | POA: Diagnosis not present

## 2020-05-19 DIAGNOSIS — S0990XA Unspecified injury of head, initial encounter: Secondary | ICD-10-CM

## 2020-05-19 DIAGNOSIS — Z7901 Long term (current) use of anticoagulants: Secondary | ICD-10-CM | POA: Insufficient documentation

## 2020-05-19 DIAGNOSIS — I6523 Occlusion and stenosis of bilateral carotid arteries: Secondary | ICD-10-CM | POA: Diagnosis not present

## 2020-05-19 NOTE — ED Provider Notes (Signed)
[facility] Emergency Department Provider Note  ____________________________________________  Time seen: Approximately 7:42 PM  I have reviewed the triage vital signs and the nursing notes.   HISTORY  Chief Complaint Fall    HPI Mariah [name] is a 74 y.o. female who presents the emergency department after mechanical fall.  Patient states that she was walking out of [employer], the concrete was uneven and she stepped into a hole causing her to fall onto her right side.  Patient hit the right side of her head on the concrete.  No loss of consciousness.  She states that she sustained a laceration above the right eyebrow but denies any headache, visual changes, neck pain.  Patient states that her right pinky finger is bruised but denies any other injuries or complaints from this fall.  She denies any neck, back, hip, lower extremity pain.  No medications prior to arrival.  Medical history as described below with no complaints.         Past Medical History:  Diagnosis Date  . Abdominal aortic aneurysm (AAA) ([place])   . Arthritis   . COPD (chronic obstructive pulmonary disease) ([place])   . GERD (gastroesophageal reflux disease)   . Headache    sinus  . History of adenomatous polyp of colon   . Hyperlipidemia   . Hypertension   . Psoriasis   . Right lumbar radiculitis   . Vitamin D deficiency     Patient Active Problem List   Diagnosis Date Noted  . Acute deep vein thrombosis (DVT) of tibial vein of left lower extremity ([place]) 04/05/2020  . Unintentional weight loss 03/01/2020  . Fall (on)(from) sidewalk curb, initial encounter 03/01/2020  . Memory loss 03/01/2020  . Allergic rhinitis 01/10/2018  . Cholelithiasis 03/16/2016  . Peripheral arterial disease ([place]) 02/05/2016  . 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 Abdominal aortic aneurysm (AAA) ([place]) 01/13/2016  . 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 Nerve root inflammation 10/25/2015  . Psoriatic  arthritis ([place]) 10/07/2015  . 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 Right hip pain 08/12/2015  . Arthralgia of multiple joints 08/12/2015  . Age related osteoporosis 08/12/2015  . Chronic obstructive pulmonary disease ([place]) 06/24/2015  . Senile purpura ([place]) 06/24/2015  . Abnormal serum level of alkaline phosphatase 06/18/2015  . Elevated hemoglobin ([place]) 06/18/2015  . Fatigue 06/18/2015  . Fistula 06/18/2015  . Blood glucose elevated 06/18/2015  . Gastro-esophageal reflux disease without esophagitis 06/18/2015  . Restless leg 06/18/2015  . Avitaminosis D 06/18/2015  . Mechanical and motor problems with internal organs 10/11/2009  . Hypercholesteremia 10/11/2009  . Arthritis, degenerative 09/25/2009  . Essential (primary) hypertension 09/25/2009  . Psoriasis 09/25/2009  . Compulsive tobacco user syndrome 09/25/2009    Past Surgical History:  Procedure Laterality Date  . APPENDECTOMY  2000  . BREAST BIOPSY Right 09/2016   radial scar;COMPLEX SCLEROSING LESION  . BREAST LUMPECTOMY Right 11/24/2016   COMPLEX SCLEROSING LESION  . BREAST LUMPECTOMY WITH NEEDLE LOCALIZATION Right 11/24/2016   Procedure: BREAST LUMPECTOMY WITH NEEDLE LOCALIZATION;  Surgeon: Leonie Green, MD;  Location: [facility] ORS;  Service: General;  Laterality: Right;  . COLONOSCOPY WITH PROPOFOL N/A 08/10/2016   Procedure: COLONOSCOPY WITH PROPOFOL;  Surgeon: Manya Silvas, MD;  Location: [facility] ENDOSCOPY;  Service: Endoscopy;  Laterality: N/A;  . ESOPHAGOGASTRODUODENOSCOPY (EGD) WITH PROPOFOL N/A 08/10/2016   Procedure: ESOPHAGOGASTRODUODENOSCOPY (EGD) WITH PROPOFOL;  Surgeon: Manya Silvas, MD;  Location: [facility] ENDOSCOPY;  Service: Endoscopy;  Laterality: N/A;  .  FOOT SURGERY Bilateral   . JOINT REPLACEMENT Right 01/27/2016   hip  . 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 TOTAL HIP ARTHROPLASTY Right 01/27/2016   Procedure: TOTAL HIP ARTHROPLASTY;  Surgeon: Dereck Leep, MD;  Location: [facility] ORS;  Service:  Orthopedics;  Laterality: Right;    Prior to Admission medications   Medication Sig Start Date End Date Taking? Authorizing Provider  albuterol (PROVENTIL HFA;VENTOLIN HFA) 108 (90 Base) MCG/ACT inhaler INHALE 1-2 PUFFS INTO THE LUNGS EVERY 6 HOURS AS NEEDED FOR WHEEZING OR SHORTNESS OF BREATH. 08/23/18   Carmon Ginsberg, PA  apixaban (ELIQUIS) 5 MG TABS tablet Take 1 tablet (5 mg total) by mouth 2 (two) times daily. 04/23/20   Algernon Huxley, MD  Apremilast (OTEZLA) 30 MG TABS Take by mouth in the morning and at bedtime.    [provider]  aspirin EC 81 MG tablet Take 81 mg by mouth daily. Swallow whole.    [provider]  betamethasone dipropionate (DIPROLENE) 0.05 % ointment Apply topically daily. 01/01/20   Ralene Bathe, MD  calcipotriene (DOVONOX) 0.005 % cream APPLY TO SKIN ONCE A DAY APPLY DAILY ON HANDS, FEET AND WRIST X 2 WEEKS, THEN USE 5 DAYS A WEEK 01/01/20   Ralene Bathe, MD  Calcipotriene 0.005 % solution PLEASE SEE ATTACHED FOR DETAILED DIRECTIONS 06/21/19   [provider]  Cholecalciferol (VITAMIN D3) 2000 units TABS Take 2,000 Units by mouth daily.    [provider]  Cholestyramine POWD Take 1 Package by mouth as needed. 04/11/18   [name], MD  clobetasol (TEMOVATE) 0.05 % external solution APPLY TO AFFECTED AREA TWICE A DAY 12/03/19   Bacigalupo, Dionne Bucy, MD  Clobetasol Propionate 0.05 % shampoo SHAMPOO WITH SMALL AMOUNT TO SKIN ONCE A DAY,SHAMPOO SCALP ONCE DAILY FOR 2 WEEKS THEN 5 DAYS A WEEK 01/01/20   Ralene Bathe, MD  COSENTYX SENSOREADY, 300 MG, 150 MG/ML SOAJ Inject 2 Syringes into the skin every 28 (twenty-eight) days.  06/19/19   [provider]  diphenhydrAMINE (BENADRYL) 25 MG tablet Take 25 mg by mouth at bedtime as needed.    [provider]  donepezil (ARICEPT) 5 MG tablet Take 5 mg by mouth at bedtime. Patient not taking: Reported on 04/23/2020 04/01/20 05/01/20  [provider]   fluticasone (FLONASE) 50 MCG/ACT nasal spray Place 2 sprays into both nostrils daily. 01/10/18   [name], MD  folic acid (FOLVITE) 1 MG tablet Take 1 mg by mouth daily.  12/02/19   [provider]  gabapentin (NEURONTIN) 300 MG capsule TAKE 1 CAPSULE BY MOUTH TWICE A DAY 05/07/20   Bacigalupo, Dionne Bucy, MD  hydrochlorothiazide (HYDRODIURIL) 12.5 MG tablet Take 12.5 mg by mouth daily.    [provider]  levocetirizine (XYZAL) 5 MG tablet TAKE 1 TABLET BY MOUTH EVERY DAY IN THE EVENING 12/03/19   Bacigalupo, Dionne Bucy, MD  metoprolol succinate (TOPROL-XL) 50 MG 24 hr tablet TAKE 1 TABLET (50 MG TOTAL) BY MOUTH DAILY. TAKE WITH OR IMMEDIATELY FOLLOWING A MEAL. 03/27/20   Bacigalupo, Dionne Bucy, MD  pantoprazole (PROTONIX) 20 MG tablet TAKE 1 TABLET (20 MG TOTAL) BY MOUTH 2 (TWO) TIMES DAILY BEFORE A MEAL. 12/28/19   Bacigalupo, Dionne Bucy, MD  polyethylene glycol powder (GLYCOLAX/MIRALAX) powder Take 17 g by mouth 2 (two) times daily as needed. 08/05/15   Margarita Rana, MD  rOPINIRole (REQUIP) 1 MG tablet TAKE 1 TABLET (1 MG TOTAL) BY  MOUTH AT BEDTIME. 12/28/19   Bacigalupo, Dionne Bucy, MD  Secukinumab (COSENTYX SENSOREADY PEN) 150 MG/ML SOAJ Inject into the skin.  04/06/19   [provider]  simvastatin (ZOCOR) 20 MG tablet TAKE 1 TABLET BY MOUTH EVERY DAY 10/01/19   Bacigalupo, Dionne Bucy, MD  traMADol (ULTRAM) 50 MG tablet TAKE 1 TABLET BY MOUTH EVERY 6 HOURS AS NEEDED FOR MODERATE OR SEVERE PAIN 01/01/20   Jerrol Banana., MD    Allergies Clindamycin/lincomycin and Amoxicillin  Family History  Problem Relation Age of Onset  . Cancer Mother        kidney  . Arthritis Father   . Heart disease Father   . COPD Sister   . Fibromyalgia Sister   . Osteoarthritis Sister   . Osteoarthritis Maternal Aunt   . Colon cancer Paternal Aunt   . Colon cancer Paternal Uncle   . Colon cancer Paternal Grandmother   . Breast cancer Neg Hx     Social History Social History    Tobacco Use  . Smoking status: Current Every Day Smoker    Packs/day: 1.00    Years: 0.00    Pack years: 0.00  . Smokeless tobacco: Never Used  Vaping Use  . Vaping Use: Former  Substance Use Topics  . Alcohol use: Yes    Comment: occasionally glass of wine  . Drug use: No     Review of Systems  Constitutional: No fever/chills.  Patient hit her head, no loss of consciousness. Eyes: No visual changes. No discharge ENT: No upper respiratory complaints. Cardiovascular: no chest pain. Respiratory: no cough. No SOB. Gastrointestinal: No abdominal pain.  No nausea, no vomiting.  No diarrhea.  No constipation. Musculoskeletal: Positive for injury to the pinky finger of the right hand Skin: Positive for laceration to the right forehead Neurological: Negative for headaches, focal weakness or numbness.  10 System ROS otherwise negative.  ____________________________________________   PHYSICAL EXAM:  VITAL SIGNS: ED Triage Vitals  Enc Vitals Group     BP 05/19/20 1805 (!) 116/43     Pulse Rate 05/19/20 1805 78     Resp 05/19/20 1805 18     Temp 05/19/20 1817 (!) 95.5 F (35.3 C)     Temp Source 05/19/20 1817 Oral     SpO2 05/19/20 1805 94 %     Weight 05/19/20 1804 123 lb 7.3 oz (56 kg)     Height 05/19/20 1804 5\' 1"  (1.549 m)     Head Circumference --      Peak Flow --      Pain Score 05/19/20 1804 0     Pain Loc --      Pain Edu? --      Excl. in [place]? --      Constitutional: Alert and oriented. Well appearing and in no acute distress. Eyes: Conjunctivae are normal. PERRL. EOMI. Head: Ragged but relatively superficial laceration noted to the right forehead just above the lateral aspect of the eyebrow.  No foreign body.  No active bleeding.  Some missing epidermal tissue is appreciated.  No surrounding erythema, edema, ecchymosis.  Patient is nontender to palpation of the osseous structures of the skull and face.  No battle signs, raccoon eyes, serosanguineous fluid  drainage from the ears or nares. ENT:      Ears:       Nose: No congestion/rhinnorhea.      Mouth/Throat: Mucous membranes are moist.  Neck: No stridor.  No cervical spine tenderness to  palpation.  Cardiovascular: Normal rate, regular rhythm. Normal S1 and S2.  Good peripheral circulation. Respiratory: Normal respiratory effort without tachypnea or retractions. Lungs CTAB. Good air entry to the bases with no decreased or absent breath sounds. Musculoskeletal: Full range of motion to all extremities. No gross deformities appreciated. Neurologic:  Normal speech and language. No gross focal neurologic deficits are appreciated.  Cranial nerves II through XII grossly intact.  Negative Romberg and pronator drift. Skin:  Skin is warm, dry and intact.  Superficial laceration as described on head description. Psychiatric: Mood and affect are normal. Speech and behavior are normal. Patient exhibits appropriate insight and judgement.   ____________________________________________   LABS (all labs ordered are listed, but only abnormal results are displayed)  Labs Reviewed - No data to display ____________________________________________  EKG   ____________________________________________  RADIOLOGY I personally viewed and evaluated these images as part of my medical decision making, as well as reviewing the written report by the radiologist.  ED Provider Interpretation: I concur with radiologist finding of no acute traumatic findings in the head or neck  CT Head Wo Contrast  Result Date: 05/19/2020 CLINICAL DATA:  Fall trauma. Patient tripped in the parking lot. Hit head and has laceration above by bra. EXAM: CT HEAD WITHOUT CONTRAST CT CERVICAL SPINE WITHOUT CONTRAST TECHNIQUE: Multidetector CT imaging of the head and cervical spine was performed following the standard protocol without intravenous contrast. Multiplanar CT image reconstructions of the cervical spine were also generated.  COMPARISON:  None. FINDINGS: CT HEAD FINDINGS Brain: There is central and cortical atrophy. Periventricular white matter changes are consistent with small vessel disease. There is no intra or extra-axial fluid collection or mass lesion. The basilar cisterns and ventricles have a normal appearance. There is no CT evidence for acute infarction or hemorrhage. Vascular: . No hyperdense vessels. There is atherosclerotic calcification of the internal carotid arteries Skull: Normal. Negative for fracture or focal lesion. Sinuses/Orbits: No acute finding. Other: There is LEFT frontal scalp edema in the supraorbital region. No associated underlying fracture. CT CERVICAL SPINE FINDINGS Alignment: Normal alignment. Skull base and vertebrae: No acute fracture. No primary bone lesion or focal pathologic process. Soft tissues and spinal canal: No prevertebral fluid or swelling. No visible canal hematoma. Disc levels: Moderate disc height loss notably at C3-4, C4-5, C5-6, and C6-7. Upper chest: Negative. Other: None IMPRESSION: 1. No evidence for acute intracranial abnormality. 2. Atrophy and small vessel disease. 3. LEFT frontal scalp edema in the supraorbital region. 4. No evidence for acute cervical spine abnormality. 5. Moderate mid cervical degenerative changes. Electronically Signed   By: Nolon Nations M.D.   On: 05/19/2020 18:53   CT Cervical Spine Wo Contrast  Result Date: 05/19/2020 CLINICAL DATA:  Fall trauma. Patient tripped in the parking lot. Hit head and has laceration above by bra. EXAM: CT HEAD WITHOUT CONTRAST CT CERVICAL SPINE WITHOUT CONTRAST TECHNIQUE: Multidetector CT imaging of the head and cervical spine was performed following the standard protocol without intravenous contrast. Multiplanar CT image reconstructions of the cervical spine were also generated. COMPARISON:  None. FINDINGS: CT HEAD FINDINGS Brain: There is central and cortical atrophy. Periventricular white matter changes are consistent  with small vessel disease. There is no intra or extra-axial fluid collection or mass lesion. The basilar cisterns and ventricles have a normal appearance. There is no CT evidence for acute infarction or hemorrhage. Vascular: . No hyperdense vessels. There is atherosclerotic calcification of the internal carotid arteries Skull: Normal. Negative for fracture or  focal lesion. Sinuses/Orbits: No acute finding. Other: There is LEFT frontal scalp edema in the supraorbital region. No associated underlying fracture. CT CERVICAL SPINE FINDINGS Alignment: Normal alignment. Skull base and vertebrae: No acute fracture. No primary bone lesion or focal pathologic process. Soft tissues and spinal canal: No prevertebral fluid or swelling. No visible canal hematoma. Disc levels: Moderate disc height loss notably at C3-4, C4-5, C5-6, and C6-7. Upper chest: Negative. Other: None IMPRESSION: 1. No evidence for acute intracranial abnormality. 2. Atrophy and small vessel disease. 3. LEFT frontal scalp edema in the supraorbital region. 4. No evidence for acute cervical spine abnormality. 5. Moderate mid cervical degenerative changes. Electronically Signed   By: Nolon Nations M.D.   On: 05/19/2020 18:53    ____________________________________________    PROCEDURES  Procedure(s) performed:    Marland KitchenMarland KitchenLaceration Repair  Date/Time: 05/19/2020 7:49 PM Performed by: Darletta Moll, PA-C Authorized by: Darletta Moll, PA-C   Consent:    Consent obtained:  Verbal   Consent given by:  Patient   Risks discussed:  Pain, poor cosmetic result and infection Anesthesia (see MAR for exact dosages):    Anesthesia method:  None Laceration details:    Location:  Face   Face location:  Forehead   Length (cm):  1 Repair type:    Repair type:  Simple Pre-procedure details:    Preparation:  Patient was prepped and draped in usual sterile fashion and imaging obtained to evaluate for foreign bodies Exploration:     Hemostasis achieved with:  Direct pressure   Wound exploration: wound explored through full range of motion and entire depth of wound probed and visualized     Wound extent: no foreign bodies/material noted, no muscle damage noted, no nerve damage noted, no tendon damage noted, no underlying fracture noted and no vascular damage noted     Contaminated: no   Treatment:    Area cleansed with:  Shur-Clens   Amount of cleaning:  Standard Skin repair:    Repair method:  Tissue adhesive Approximation:    Approximation:  Loose Post-procedure details:    Dressing:  Open (no dressing)   Patient tolerance of procedure:  Tolerated well, no immediate complications Comments:     Patient with superficial laceration to the right forehead just superior to the eyebrow.  Edges were ragged, missing epidermal tissue.  Area was thoroughly cleansed, covered with Dermabond. Marland KitchenSplint Application  Date/Time: 05/19/2020 7:50 PM Performed by: Darletta Moll, PA-C Authorized by: Darletta Moll, PA-C   Consent:    Consent obtained:  Verbal   Consent given by:  Patient   Risks discussed:  Pain and swelling Pre-procedure details:    Sensation:  Normal Procedure details:    Laterality:  Right   Location:  Finger   Finger:  R small finger   Splint type: Patient pinky and ring finger on the right hand are buddy taped.   Supplies used: elastik tape. Post-procedure details:    Pain:  Improved   Sensation:  Normal   Patient tolerance of procedure:  Tolerated well, no immediate complications      Medications - No data to display   ____________________________________________   INITIAL IMPRESSION / ASSESSMENT AND PLAN / ED COURSE  Pertinent labs & imaging results that were available during my care of the patient were reviewed by me and considered in my medical decision making (see chart for details).  Review of the [place] CSRS was performed in accordance of the [place] prior to dispensing  any  controlled drugs.           Patient's diagnosis is consistent with fall, minor head injury, superficial laceration to the right forehead.  Patient did not want stitches, states that she would take any other form of closure including Dermabond or Steri-Strips.  Likely this was a relatively superficial laceration, edges were ragged, missing epidermal tissue.  However again it was very superficial and amenable to Dermabond..  Patient also had an injury to the pinky finger, full range of motion, no deformities noted and states that she did not want an x-ray.  Patient did agree to imaging of her head and neck which were reassuring with no evidence of acute traumatic findings.  Patient is neurologically intact.  Laceration repaired as described, fourth and fifth digits are buddy taped, patient is to follow-up with primary care.  Patient is given ED precautions to return to the ED for any worsening or new symptoms.     ____________________________________________  FINAL CLINICAL IMPRESSION(S) / ED DIAGNOSES  Final diagnoses:  Fall, initial encounter  Minor head injury, initial encounter  Facial laceration, initial encounter  Injury of finger of right hand, initial encounter      NEW MEDICATIONS STARTED DURING THIS VISIT:  ED Discharge Orders    None          This chart was dictated using voice recognition software/Dragon. Despite best efforts to proofread, errors can occur which can change the meaning. Any change was purely unintentional.    Darletta Moll, PA-C 05/19/20 2000    Nena Polio, MD 05/19/20 [id number]

## 2020-05-19 NOTE — ED Notes (Signed)
Oral temperature not getting higher than 95.5. Pt states that she does feel cold. Axillary not registering. PA aware.

## 2020-05-19 NOTE — ED Triage Notes (Signed)
Pt comes after tripping in parking lot at [employer]. Pt hit head and has lac to head above eyebrow. Denies blood thinners. AOx4.

## 2020-05-22 ENCOUNTER — Encounter: Payer: Self-pay | Admitting: Physician Assistant

## 2020-05-22 ENCOUNTER — Ambulatory Visit (INDEPENDENT_AMBULATORY_CARE_PROVIDER_SITE_OTHER): Payer: PPO | Admitting: Physician Assistant

## 2020-05-22 ENCOUNTER — Ambulatory Visit
Admission: RE | Admit: 2020-05-22 | Discharge: 2020-05-22 | Disposition: A | Discharge status: Home / Self Care | Payer: PPO | Attending: Physician Assistant | Admitting: Physician Assistant

## 2020-05-22 ENCOUNTER — Other Ambulatory Visit: Payer: Self-pay

## 2020-05-22 ENCOUNTER — Ambulatory Visit
Admission: RE | Admit: 2020-05-22 | Discharge: 2020-05-22 | Disposition: A | Discharge status: Home / Self Care | Payer: PPO | Source: Ambulatory Visit | Attending: Physician Assistant | Admitting: Physician Assistant

## 2020-05-22 VITALS — BP 99/64 | HR 56 | Temp 97.8°F | Wt 139.8 lb

## 2020-05-22 DIAGNOSIS — M79641 Pain in right hand: Secondary | ICD-10-CM

## 2020-05-22 DIAGNOSIS — S62316A Displaced fracture of base of fifth metacarpal bone, right hand, initial encounter for closed fracture: Secondary | ICD-10-CM | POA: Diagnosis not present

## 2020-05-22 DIAGNOSIS — J439 Emphysema, unspecified: Secondary | ICD-10-CM | POA: Diagnosis not present

## 2020-05-22 DIAGNOSIS — S62521A Displaced fracture of distal phalanx of right thumb, initial encounter for closed fracture: Secondary | ICD-10-CM | POA: Diagnosis not present

## 2020-05-22 DIAGNOSIS — S0990XS Unspecified injury of head, sequela: Secondary | ICD-10-CM | POA: Diagnosis not present

## 2020-05-22 DIAGNOSIS — S62616A Displaced fracture of proximal phalanx of right little finger, initial encounter for closed fracture: Secondary | ICD-10-CM | POA: Diagnosis not present

## 2020-05-22 MED ORDER — BREO ELLIPTA 100-25 MCG/INH IN AEPB: 1.0000 | INHALATION_SPRAY | Freq: Every day | RESPIRATORY_TRACT | 0 refills | Status: DC

## 2020-05-22 MED ORDER — ALBUTEROL SULFATE HFA 108 (90 BASE) MCG/ACT IN AERS: INHALATION_SPRAY | RESPIRATORY_TRACT | 0 refills | Status: DC

## 2020-05-22 NOTE — Progress Notes (Signed)
Established patient visit   Patient: Mariah [name]   DOB: 12/11/1945   74 y.o. Female  MRN: [id number] Visit Date: 05/22/2020  Today's healthcare provider: Trinna Post, PA-C   Chief Complaint  Patient presents with  . ER follow-up  I,Porsha C McClurkin,acting as a scribe for Trinna Post, PA-C.,have documented all relevant documentation on the behalf of Trinna Post, PA-C,as directed by  Trinna Post, PA-C while in the presence of Trinna Post, PA-C.  Subjective    HPI  Follow up ER visit  Patient was seen in ER for fall on 05/19/2020. She was treated for: fall, minor head injury, facial laceration and injury of finger of right hand. Treatment for this included CT head. Her Ct head showed left preorbital region swelling but otherwise no acute fractures.  She reports good compliance with treatment. She reports this condition is Improved.  Today she reports pain and swelling around her eyes. She reports pain and swelling in her right thumb and fifth finger. She denies headaches, confusion, LOC, vomiting. She is currently on Eliquis.   She reports she was more SOB this morning walking around the house and putting on clothes. She has a history of COPD and is currently not on maintenance therapy. No productive cough, wheezing.  -----------------------------------------------------------------------------------------        Medications: Outpatient Medications Prior to Visit  Medication Sig  . albuterol (PROVENTIL HFA;VENTOLIN HFA) 108 (90 Base) MCG/ACT inhaler INHALE 1-2 PUFFS INTO THE LUNGS EVERY 6 HOURS AS NEEDED FOR WHEEZING OR SHORTNESS OF BREATH.  [name] apixaban (ELIQUIS) 5 MG TABS tablet Take 1 tablet (5 mg total) by mouth 2 (two) times daily.  [name] Apremilast (OTEZLA) 30 MG TABS Take by mouth in the morning and at bedtime.  [name] aspirin EC 81 MG tablet Take 81 mg by mouth daily. Swallow whole.  . betamethasone dipropionate (DIPROLENE) 0.05 % ointment Apply  topically daily.  . calcipotriene (DOVONOX) 0.005 % cream APPLY TO SKIN ONCE A DAY APPLY DAILY ON HANDS, FEET AND WRIST X 2 WEEKS, THEN USE 5 DAYS A WEEK  . Calcipotriene 0.005 % solution PLEASE SEE ATTACHED FOR DETAILED DIRECTIONS  . Cholecalciferol (VITAMIN D3) 2000 units TABS Take 2,000 Units by mouth daily.  . Cholestyramine POWD Take 1 Package by mouth as needed.  . clobetasol (TEMOVATE) 0.05 % external solution APPLY TO AFFECTED AREA TWICE A DAY  . Clobetasol Propionate 0.05 % shampoo SHAMPOO WITH SMALL AMOUNT TO SKIN ONCE A DAY,SHAMPOO SCALP ONCE DAILY FOR 2 WEEKS THEN 5 DAYS A WEEK  . COSENTYX SENSOREADY, 300 MG, 150 MG/ML SOAJ Inject 2 Syringes into the skin every 28 (twenty-eight) days.   . diphenhydrAMINE (BENADRYL) 25 MG tablet Take 25 mg by mouth at bedtime as needed.  . donepezil (ARICEPT) 5 MG tablet Take 5 mg by mouth at bedtime. (Patient not taking: Reported on 04/23/2020)  . fluticasone (FLONASE) 50 MCG/ACT nasal spray Place 2 sprays into both nostrils daily.  . folic acid (FOLVITE) 1 MG tablet Take 1 mg by mouth daily.   [name] gabapentin (NEURONTIN) 300 MG capsule TAKE 1 CAPSULE BY MOUTH TWICE A DAY  . hydrochlorothiazide (HYDRODIURIL) 12.5 MG tablet Take 12.5 mg by mouth daily.  [name] levocetirizine (XYZAL) 5 MG tablet TAKE 1 TABLET BY MOUTH EVERY DAY IN THE EVENING  . metoprolol succinate (TOPROL-XL) 50 MG 24 hr tablet TAKE 1 TABLET (50 MG TOTAL) BY MOUTH DAILY. TAKE WITH OR IMMEDIATELY FOLLOWING A MEAL.  [name]  pantoprazole (PROTONIX) 20 MG tablet TAKE 1 TABLET (20 MG TOTAL) BY MOUTH 2 (TWO) TIMES DAILY BEFORE A MEAL.  [name] polyethylene glycol powder (GLYCOLAX/MIRALAX) powder Take 17 g by mouth 2 (two) times daily as needed.  [name] rOPINIRole (REQUIP) 1 MG tablet TAKE 1 TABLET (1 MG TOTAL) BY MOUTH AT BEDTIME.  . Secukinumab (COSENTYX SENSOREADY PEN) 150 MG/ML SOAJ Inject into the skin.   [name] simvastatin (ZOCOR) 20 MG tablet TAKE 1 TABLET BY MOUTH EVERY DAY  . traMADol (ULTRAM) 50 MG tablet TAKE  1 TABLET BY MOUTH EVERY 6 HOURS AS NEEDED FOR MODERATE OR SEVERE PAIN   No facility-administered medications prior to visit.    Review of Systems    Objective    BP 99/64 (BP Location: Right Arm, Patient Position: Sitting, Cuff Size: Large)   Pulse (!) 56   Temp 97.8 F (36.6 C) (Oral)   Wt 139 lb 12.8 oz (63.4 kg)   SpO2 94%   BMI 26.41 kg/m    Physical Exam Constitutional:      Appearance: Normal appearance.  HENT:     Head: Contusion present. No raccoon eyes or Battle's sign.      Right Ear: Tympanic membrane normal.     Left Ear: Tympanic membrane normal.  Eyes:     Extraocular Movements: Extraocular movements intact.     Pupils: Pupils are equal, round, and reactive to light.  Cardiovascular:     Rate and Rhythm: Regular rhythm.     Heart sounds: Normal heart sounds.  Pulmonary:     Effort: Pulmonary effort is normal.     Breath sounds: Normal breath sounds.  Musculoskeletal:     Comments: Right first and fifth fingers are red and swollen.   Skin:    General: Skin is warm and dry.  Neurological:     Mental Status: She is alert. Mental status is at baseline.  Psychiatric:        Mood and Affect: Mood normal.        Behavior: Behavior normal.       No results found for any visits on 05/22/20.  Assessment & Plan    1. Injury of head, sequela  Stable, no acute bleeds on head CT and patient recovering well.  2. Pulmonary emphysema, unspecified emphysema type ([place])  Start breo.  - fluticasone furoate-vilanterol (BREO ELLIPTA) 100-25 MCG/INH AEPB; Inhale 1 puff into the lungs daily.  Dispense: 60 each; Refill: 0 - albuterol (VENTOLIN HFA) 108 (90 Base) MCG/ACT inhaler; INHALE 1-2 PUFFS INTO THE LUNGS EVERY 6 HOURS AS NEEDED FOR WHEEZING OR SHORTNESS OF BREATH.  Dispense: 8 g; Refill: 0  3. Pain of right hand  Xrays show fractures of 1st and 5th fingers on right hand. Refer to hand surgery.  - DG Hand Complete Right; Future  No follow-ups on file.        ITrinna Post, PA-C, have reviewed all documentation for this visit. The documentation on 05/29/20 for the exam, diagnosis, procedures, and orders are all accurate and complete.  The entirety of the information documented in the History of Present Illness, Review of Systems and Physical Exam were personally obtained by me. Portions of this information were initially documented by [facility] and reviewed by me for thoroughness and accuracy.     Paulene Floor  [facility] [phone] (phone) [phone] (fax)  [place]

## 2020-05-23 ENCOUNTER — Telehealth: Payer: Self-pay

## 2020-05-23 DIAGNOSIS — R2681 Unsteadiness on feet: Secondary | ICD-10-CM | POA: Diagnosis not present

## 2020-05-23 DIAGNOSIS — I82442 Acute embolism and thrombosis of left tibial vein: Secondary | ICD-10-CM | POA: Diagnosis not present

## 2020-05-23 NOTE — Telephone Encounter (Signed)
Copied from [place] [id number]. Topic: General - Other >> May 23, 2020 12:29 PM Oneta Rack wrote: Reason for CRM: patient inquiring bout DG Hand Complete Right results, please advise

## 2020-05-23 NOTE — Telephone Encounter (Signed)
As of right now, results are not back yet.  

## 2020-05-24 ENCOUNTER — Telehealth: Payer: Self-pay | Admitting: Family Medicine

## 2020-05-24 ENCOUNTER — Telehealth: Payer: Self-pay

## 2020-05-24 DIAGNOSIS — S6291XA Unspecified fracture of right wrist and hand, initial encounter for closed fracture: Secondary | ICD-10-CM

## 2020-05-24 DIAGNOSIS — S62501A Fracture of unspecified phalanx of right thumb, initial encounter for closed fracture: Secondary | ICD-10-CM

## 2020-05-24 NOTE — Telephone Encounter (Signed)
Viewed xray, recommend referral to ortho.

## 2020-05-24 NOTE — Telephone Encounter (Signed)
You currently do not have any openings next week. Would you like to work the patient in? Please advise. Thanks!

## 2020-05-24 NOTE — Telephone Encounter (Signed)
Patient and niece was advised and referral was placed.

## 2020-05-24 NOTE — Telephone Encounter (Signed)
Patient's niece Dotsie, [place] is calling to see if the xrays of the patient's hand have returned. Please advise CB- [phone]

## 2020-05-24 NOTE — Telephone Encounter (Signed)
If she is having SOB, she really needs to be seen today and I don't have openings.  I can find a time to work her in on Monday, but not sure she should wait.

## 2020-05-24 NOTE — Telephone Encounter (Signed)
Patient was advised and say it is okay to move forward with referral to ortho.

## 2020-05-24 NOTE — Telephone Encounter (Signed)
-----   Message from Trinna Post, [place] sent at 05/24/2020  4:30 PM EDT ----- Her thumb and pinky fingers are broken. Would recommend seeing hand surgery at [facility]. If patient OK, can place referral under hand fracture.

## 2020-05-24 NOTE — Telephone Encounter (Signed)
Opal Sidles, Radiology Assistant from [facility] called the report of xray right hand-Fractures of the first and fifth distal phalanges of the right hand. Office called and spoke [place], [facility] to notify this will be routed.

## 2020-05-24 NOTE — Telephone Encounter (Signed)
Patient reports symptoms are unchanged since starting Breo. Patient reports she is still having to use Albuterol for shortness of breath. Patient reports she will wait till Monday. Patient advised to go to ER or Urgent Care if symptoms get worse.

## 2020-05-24 NOTE — Telephone Encounter (Signed)
Ms. Cyr niece was by the office today and said she is having increased SOB and needs to be seen.   She thinks she needs O2 all the time.     She only wants Dr. B to see her.  Please advise when she can bring her in.

## 2020-05-24 NOTE — Telephone Encounter (Signed)
Patient's niece [place]) call was returned and she was advised.

## 2020-05-26 ENCOUNTER — Encounter: Payer: Self-pay | Admitting: Internal Medicine

## 2020-05-26 ENCOUNTER — Other Ambulatory Visit: Payer: Self-pay

## 2020-05-26 ENCOUNTER — Observation Stay
Admission: EM | Admit: 2020-05-26 | Discharge: 2020-05-28 | Disposition: A | Discharge status: Home / Self Care | Payer: PPO | Attending: Internal Medicine | Admitting: Internal Medicine

## 2020-05-26 ENCOUNTER — Emergency Department: Payer: PPO

## 2020-05-26 DIAGNOSIS — I129 Hypertensive chronic kidney disease with stage 1 through stage 4 chronic kidney disease, or unspecified chronic kidney disease: Secondary | ICD-10-CM | POA: Diagnosis not present

## 2020-05-26 DIAGNOSIS — W1839XA Other fall on same level, initial encounter: Secondary | ICD-10-CM | POA: Insufficient documentation

## 2020-05-26 DIAGNOSIS — R0602 Shortness of breath: Secondary | ICD-10-CM | POA: Diagnosis not present

## 2020-05-26 DIAGNOSIS — I82409 Acute embolism and thrombosis of unspecified deep veins of unspecified lower extremity: Secondary | ICD-10-CM | POA: Diagnosis not present

## 2020-05-26 DIAGNOSIS — D72819 Decreased white blood cell count, unspecified: Secondary | ICD-10-CM | POA: Diagnosis present

## 2020-05-26 DIAGNOSIS — R0902 Hypoxemia: Secondary | ICD-10-CM | POA: Diagnosis not present

## 2020-05-26 DIAGNOSIS — Z7982 Long term (current) use of aspirin: Secondary | ICD-10-CM | POA: Diagnosis not present

## 2020-05-26 DIAGNOSIS — D500 Iron deficiency anemia secondary to blood loss (chronic): Secondary | ICD-10-CM | POA: Diagnosis not present

## 2020-05-26 DIAGNOSIS — F17200 Nicotine dependence, unspecified, uncomplicated: Secondary | ICD-10-CM | POA: Diagnosis not present

## 2020-05-26 DIAGNOSIS — E785 Hyperlipidemia, unspecified: Secondary | ICD-10-CM | POA: Diagnosis present

## 2020-05-26 DIAGNOSIS — E7800 Pure hypercholesterolemia, unspecified: Secondary | ICD-10-CM | POA: Diagnosis present

## 2020-05-26 DIAGNOSIS — S7001XA Contusion of right hip, initial encounter: Secondary | ICD-10-CM | POA: Diagnosis not present

## 2020-05-26 DIAGNOSIS — S7011XA Contusion of right thigh, initial encounter: Secondary | ICD-10-CM | POA: Diagnosis not present

## 2020-05-26 DIAGNOSIS — Z96641 Presence of right artificial hip joint: Secondary | ICD-10-CM | POA: Insufficient documentation

## 2020-05-26 DIAGNOSIS — F17210 Nicotine dependence, cigarettes, uncomplicated: Secondary | ICD-10-CM | POA: Diagnosis not present

## 2020-05-26 DIAGNOSIS — M7989 Other specified soft tissue disorders: Secondary | ICD-10-CM | POA: Diagnosis not present

## 2020-05-26 DIAGNOSIS — D649 Anemia, unspecified: Secondary | ICD-10-CM | POA: Insufficient documentation

## 2020-05-26 DIAGNOSIS — Z7901 Long term (current) use of anticoagulants: Secondary | ICD-10-CM

## 2020-05-26 DIAGNOSIS — N1831 Chronic kidney disease, stage 3a: Secondary | ICD-10-CM | POA: Diagnosis not present

## 2020-05-26 DIAGNOSIS — Z20822 Contact with and (suspected) exposure to covid-19: Secondary | ICD-10-CM | POA: Diagnosis not present

## 2020-05-26 DIAGNOSIS — J441 Chronic obstructive pulmonary disease with (acute) exacerbation: Secondary | ICD-10-CM | POA: Diagnosis present

## 2020-05-26 DIAGNOSIS — K219 Gastro-esophageal reflux disease without esophagitis: Secondary | ICD-10-CM | POA: Diagnosis present

## 2020-05-26 DIAGNOSIS — R0600 Dyspnea, unspecified: Secondary | ICD-10-CM | POA: Diagnosis present

## 2020-05-26 DIAGNOSIS — D61818 Other pancytopenia: Secondary | ICD-10-CM | POA: Diagnosis present

## 2020-05-26 DIAGNOSIS — S62609A Fracture of unspecified phalanx of unspecified finger, initial encounter for closed fracture: Secondary | ICD-10-CM | POA: Diagnosis present

## 2020-05-26 DIAGNOSIS — J90 Pleural effusion, not elsewhere classified: Secondary | ICD-10-CM | POA: Diagnosis not present

## 2020-05-26 DIAGNOSIS — I10 Essential (primary) hypertension: Secondary | ICD-10-CM | POA: Diagnosis present

## 2020-05-26 DIAGNOSIS — J449 Chronic obstructive pulmonary disease, unspecified: Secondary | ICD-10-CM | POA: Diagnosis not present

## 2020-05-26 DIAGNOSIS — Z79899 Other long term (current) drug therapy: Secondary | ICD-10-CM | POA: Insufficient documentation

## 2020-05-26 DIAGNOSIS — S62524A Nondisplaced fracture of distal phalanx of right thumb, initial encounter for closed fracture: Secondary | ICD-10-CM | POA: Diagnosis not present

## 2020-05-26 DIAGNOSIS — N183 Chronic kidney disease, stage 3 unspecified: Secondary | ICD-10-CM | POA: Diagnosis not present

## 2020-05-26 DIAGNOSIS — S79921A Unspecified injury of right thigh, initial encounter: Secondary | ICD-10-CM | POA: Diagnosis present

## 2020-05-26 DIAGNOSIS — S32591A Other specified fracture of right pubis, initial encounter for closed fracture: Secondary | ICD-10-CM | POA: Diagnosis not present

## 2020-05-26 DIAGNOSIS — I82442 Acute embolism and thrombosis of left tibial vein: Secondary | ICD-10-CM | POA: Diagnosis present

## 2020-05-26 LAB — CBC
HCT: 26.2 % — ABNORMAL LOW (ref 36.0–46.0)
HCT: 30.1 % — ABNORMAL LOW (ref 36.0–46.0)
Hemoglobin: 8.2 g/dL — ABNORMAL LOW (ref 12.0–15.0)
Hemoglobin: 9.7 g/dL — ABNORMAL LOW (ref 12.0–15.0)
MCH: 29.8 pg (ref 26.0–34.0)
MCH: 30 pg (ref 26.0–34.0)
MCHC: 31.3 g/dL (ref 30.0–36.0)
MCHC: 32.2 g/dL (ref 30.0–36.0)
MCV: 93.2 fL (ref 80.0–100.0)
MCV: 95.3 fL (ref 80.0–100.0)
Platelets: 143 10*3/uL — ABNORMAL LOW (ref 150–400)
Platelets: 163 10*3/uL (ref 150–400)
RBC: 2.75 MIL/uL — ABNORMAL LOW (ref 3.87–5.11)
RBC: 3.23 MIL/uL — ABNORMAL LOW (ref 3.87–5.11)
RDW: 15.2 % (ref 11.5–15.5)
RDW: 15.6 % — ABNORMAL HIGH (ref 11.5–15.5)
WBC: 2.9 10*3/uL — ABNORMAL LOW (ref 4.0–10.5)
WBC: 4.8 10*3/uL (ref 4.0–10.5)
nRBC: 0 % (ref 0.0–0.2)
nRBC: 0 % (ref 0.0–0.2)

## 2020-05-26 LAB — CBC WITH DIFFERENTIAL/PLATELET
Abs Immature Granulocytes: 0.01 10*3/uL (ref 0.00–0.07)
Basophils Absolute: 0 10*3/uL (ref 0.0–0.1)
Basophils Relative: 0 %
Eosinophils Absolute: 0 10*3/uL (ref 0.0–0.5)
Eosinophils Relative: 1 %
HCT: 21.3 % — ABNORMAL LOW (ref 36.0–46.0)
Hemoglobin: 6.8 g/dL — ABNORMAL LOW (ref 12.0–15.0)
Immature Granulocytes: 0 %
Lymphocytes Relative: 18 %
Lymphs Abs: 0.6 10*3/uL — ABNORMAL LOW (ref 0.7–4.0)
MCH: 29.4 pg (ref 26.0–34.0)
MCHC: 31.9 g/dL (ref 30.0–36.0)
MCV: 92.2 fL (ref 80.0–100.0)
Monocytes Absolute: 0.2 10*3/uL (ref 0.1–1.0)
Monocytes Relative: 6 %
Neutro Abs: 2.4 10*3/uL (ref 1.7–7.7)
Neutrophils Relative %: 75 %
Platelets: 181 10*3/uL (ref 150–400)
RBC: 2.31 MIL/uL — ABNORMAL LOW (ref 3.87–5.11)
RDW: 16.3 % — ABNORMAL HIGH (ref 11.5–15.5)
WBC: 3.2 10*3/uL — ABNORMAL LOW (ref 4.0–10.5)
nRBC: 0 % (ref 0.0–0.2)

## 2020-05-26 LAB — COMPREHENSIVE METABOLIC PANEL
ALT: 18 U/L (ref 0–44)
AST: 22 U/L (ref 15–41)
Albumin: 2.6 g/dL — ABNORMAL LOW (ref 3.5–5.0)
Alkaline Phosphatase: 152 U/L — ABNORMAL HIGH (ref 38–126)
Anion gap: 7 (ref 5–15)
BUN: 20 mg/dL (ref 8–23)
CO2: 26 mmol/L (ref 22–32)
Calcium: 8.3 mg/dL — ABNORMAL LOW (ref 8.9–10.3)
Chloride: 100 mmol/L (ref 98–111)
Creatinine, Ser: 1.17 mg/dL — ABNORMAL HIGH (ref 0.44–1.00)
GFR, Estimated: 49 mL/min — ABNORMAL LOW (ref >60)
Glucose, Bld: 92 mg/dL (ref 70–99)
Potassium: 4.8 mmol/L (ref 3.5–5.1)
Sodium: 133 mmol/L — ABNORMAL LOW (ref 135–145)
Total Bilirubin: 0.9 mg/dL (ref 0.3–1.2)
Total Protein: 5 g/dL — ABNORMAL LOW (ref 6.5–8.1)

## 2020-05-26 LAB — RESPIRATORY PANEL BY RT PCR (FLU A&B, COVID)
Influenza A by PCR: NEGATIVE
Influenza B by PCR: NEGATIVE
SARS Coronavirus 2 by RT PCR: NEGATIVE

## 2020-05-26 LAB — PROTIME-INR
INR: 1.1 (ref 0.8–1.2)
Prothrombin Time: 13.3 seconds (ref 11.4–15.2)

## 2020-05-26 LAB — PROCALCITONIN: Procalcitonin: <0.1 ng/mL

## 2020-05-26 LAB — TROPONIN I (HIGH SENSITIVITY)
Troponin I (High Sensitivity): 5 ng/L (ref <18)
Troponin I (High Sensitivity): 6 ng/L (ref <18)

## 2020-05-26 LAB — BRAIN NATRIURETIC PEPTIDE: B Natriuretic Peptide: 554.7 pg/mL — ABNORMAL HIGH (ref 0.0–100.0)

## 2020-05-26 LAB — APTT: aPTT: 34 seconds (ref 24–36)

## 2020-05-26 LAB — PREPARE RBC (CROSSMATCH)

## 2020-05-26 MED ORDER — OXYCODONE-ACETAMINOPHEN 5-325 MG PO TABS
1.0000 | ORAL_TABLET | ORAL | Status: DC | PRN
  Administered 2020-05-26 – 2020-05-28 (×5): 1 via ORAL
  Filled 2020-05-26 (×5): qty 1

## 2020-05-26 MED ORDER — LEVOCETIRIZINE DIHYDROCHLORIDE 5 MG PO TABS: 2.5000 mg | ORAL_TABLET | Freq: Every evening | ORAL | Status: DC

## 2020-05-26 MED ORDER — DONEPEZIL HCL 5 MG PO TABS
5.0000 mg | ORAL_TABLET | Freq: Every day | ORAL | Status: DC
  Administered 2020-05-26 – 2020-05-27 (×2): 5 mg via ORAL
  Filled 2020-05-26 (×3): qty 1

## 2020-05-26 MED ORDER — LORATADINE 10 MG PO TABS
10.0000 mg | ORAL_TABLET | Freq: Every day | ORAL | Status: DC
  Administered 2020-05-26 – 2020-05-27 (×2): 10 mg via ORAL
  Filled 2020-05-26 (×2): qty 1

## 2020-05-26 MED ORDER — FLUTICASONE FUROATE-VILANTEROL 100-25 MCG/INH IN AEPB
1.0000 | INHALATION_SPRAY | Freq: Every day | RESPIRATORY_TRACT | Status: DC
  Administered 2020-05-26 – 2020-05-27 (×2): 1 via RESPIRATORY_TRACT
  Filled 2020-05-26: qty 28

## 2020-05-26 MED ORDER — ACETAMINOPHEN 325 MG PO TABS: 650.0000 mg | ORAL_TABLET | Freq: Four times a day (QID) | ORAL | Status: DC | PRN

## 2020-05-26 MED ORDER — MORPHINE SULFATE (PF) 2 MG/ML IV SOLN: 0.5000 mg | INTRAVENOUS | Status: DC | PRN

## 2020-05-26 MED ORDER — ONDANSETRON HCL 4 MG/2ML IJ SOLN: 4.0000 mg | Freq: Three times a day (TID) | INTRAMUSCULAR | Status: DC | PRN

## 2020-05-26 MED ORDER — DM-GUAIFENESIN ER 30-600 MG PO TB12: 1.0000 | ORAL_TABLET | Freq: Two times a day (BID) | ORAL | Status: DC | PRN

## 2020-05-26 MED ORDER — VITAMIN D3 25 MCG (1000 UNIT) PO TABS
2000.0000 [IU] | ORAL_TABLET | Freq: Every day | ORAL | Status: DC
  Administered 2020-05-27: 2000 [IU] via ORAL
  Filled 2020-05-26 (×3): qty 2

## 2020-05-26 MED ORDER — SODIUM CHLORIDE 0.9% IV SOLUTION
Freq: Once | INTRAVENOUS | Status: AC
  Filled 2020-05-26: qty 250

## 2020-05-26 MED ORDER — ALBUTEROL SULFATE HFA 108 (90 BASE) MCG/ACT IN AERS
2.0000 | INHALATION_SPRAY | RESPIRATORY_TRACT | Status: DC | PRN
  Administered 2020-05-27: 2 via RESPIRATORY_TRACT
  Filled 2020-05-26 (×2): qty 6.7

## 2020-05-26 MED ORDER — TRAMADOL HCL 50 MG PO TABS
50.0000 mg | ORAL_TABLET | Freq: Once | ORAL | Status: AC
  Administered 2020-05-26: 50 mg via ORAL
  Filled 2020-05-26: qty 1

## 2020-05-26 MED ORDER — SIMVASTATIN 20 MG PO TABS
20.0000 mg | ORAL_TABLET | Freq: Every day | ORAL | Status: DC
  Administered 2020-05-26 – 2020-05-27 (×2): 20 mg via ORAL
  Filled 2020-05-26 (×3): qty 1

## 2020-05-26 MED ORDER — HYDRALAZINE HCL 20 MG/ML IJ SOLN: 5.0000 mg | INTRAMUSCULAR | Status: DC | PRN

## 2020-05-26 MED ORDER — GABAPENTIN 300 MG PO CAPS
300.0000 mg | ORAL_CAPSULE | Freq: Two times a day (BID) | ORAL | Status: DC
  Administered 2020-05-26 – 2020-05-27 (×3): 300 mg via ORAL
  Filled 2020-05-26 (×3): qty 1

## 2020-05-26 MED ORDER — METOPROLOL SUCCINATE ER 50 MG PO TB24
50.0000 mg | ORAL_TABLET | Freq: Every day | ORAL | Status: DC
  Administered 2020-05-26 – 2020-05-27 (×2): 50 mg via ORAL
  Filled 2020-05-26 (×2): qty 1

## 2020-05-26 MED ORDER — PANTOPRAZOLE SODIUM 20 MG PO TBEC
20.0000 mg | DELAYED_RELEASE_TABLET | Freq: Two times a day (BID) | ORAL | Status: DC
  Administered 2020-05-27 (×2): 20 mg via ORAL
  Filled 2020-05-26 (×4): qty 1

## 2020-05-26 MED ORDER — FUROSEMIDE 10 MG/ML IJ SOLN
20.0000 mg | Freq: Once | INTRAMUSCULAR | Status: AC
  Administered 2020-05-26: 20 mg via INTRAVENOUS
  Filled 2020-05-26: qty 4

## 2020-05-26 MED ORDER — ROPINIROLE HCL 1 MG PO TABS
1.0000 mg | ORAL_TABLET | Freq: Every day | ORAL | Status: DC
  Administered 2020-05-26 – 2020-05-27 (×2): 1 mg via ORAL
  Filled 2020-05-26 (×2): qty 1

## 2020-05-26 NOTE — ED Provider Notes (Signed)
[facility] Emergency Department Provider Note  ____________________________________________   First MD Initiated Contact with Patient 05/26/20 0902     (approximate)  I have reviewed the triage vital signs and the nursing notes.   HISTORY  Chief Complaint Shortness of Breath    HPI Mariah [name] is a 74 y.o. female  With h/o HTN, HLD, AAA, DVT on eliquis, here with SOB. Pt fell on 10/31. She sustained a small fx of her right hand at that time but has otherwise been well. CT Head/C Spine were negative. Over the past several days, she has noticed increasing pain, bruising to R thigh. She has subsequently developed a large hematoma that is spreading. She now feels SOB x 2 days, worse with exertion but now present at rest as well. Feels like she cannot catch her breath. No associated fever, chills. No cough. She did not fall or land on her chest. No other complaints.         Past Medical History:  Diagnosis Date  . Abdominal aortic aneurysm (AAA) ([place])   . Arthritis   . COPD (chronic obstructive pulmonary disease) ([place])   . GERD (gastroesophageal reflux disease)   . Headache    sinus  . History of adenomatous polyp of colon   . Hyperlipidemia   . Hypertension   . Psoriasis   . Right lumbar radiculitis   . Vitamin D deficiency     Patient Active Problem List   Diagnosis Date Noted  . Symptomatic anemia 05/26/2020  . CKD (chronic kidney disease), stage III ([place]) 05/26/2020  . Hematoma of right thigh 05/26/2020  . Finger fracture, right 05/26/2020  . SOB (shortness of breath) 05/26/2020  . Acute deep vein thrombosis (DVT) of tibial vein of left lower extremity ([place]) 04/05/2020  . Unintentional weight loss 03/01/2020  . Fall (on)(from) sidewalk curb, initial encounter 03/01/2020  . Memory loss 03/01/2020  . Allergic rhinitis 01/10/2018  . Cholelithiasis 03/16/2016  . Peripheral arterial disease ([place]) 02/05/2016  . S/P total hip arthroplasty  01/27/2016  . Abdominal aortic aneurysm (AAA) ([place]) 01/13/2016  . 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 Nerve root inflammation 10/25/2015  . Psoriatic arthritis ([place]) 10/07/2015  . 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 Right hip pain 08/12/2015  . Arthralgia of multiple joints 08/12/2015  . Age related osteoporosis 08/12/2015  . Chronic obstructive pulmonary disease ([place]) 06/24/2015  . Senile purpura ([place]) 06/24/2015  . Abnormal serum level of alkaline phosphatase 06/18/2015  . Elevated hemoglobin ([place]) 06/18/2015  . Fatigue 06/18/2015  . Fistula 06/18/2015  . Blood glucose elevated 06/18/2015  . Gastro-esophageal reflux disease without esophagitis 06/18/2015  . Restless leg 06/18/2015  . Avitaminosis D 06/18/2015  . Mechanical and motor problems with internal organs 10/11/2009  . Hypercholesteremia 10/11/2009  . Arthritis, degenerative 09/25/2009  . Essential (primary) hypertension 09/25/2009  . Psoriasis 09/25/2009  . Compulsive tobacco user syndrome 09/25/2009    Past Surgical History:  Procedure Laterality Date  . APPENDECTOMY  2000  . BREAST BIOPSY Right 09/2016   radial scar;COMPLEX SCLEROSING LESION  . BREAST LUMPECTOMY Right 11/24/2016   COMPLEX SCLEROSING LESION  . BREAST LUMPECTOMY WITH NEEDLE LOCALIZATION Right 11/24/2016   Procedure: BREAST LUMPECTOMY WITH NEEDLE LOCALIZATION;  Surgeon: Leonie Green, MD;  Location: [facility] ORS;  Service: General;  Laterality: Right;  . COLONOSCOPY WITH PROPOFOL N/A 08/10/2016   Procedure: COLONOSCOPY WITH PROPOFOL;  Surgeon: Manya Silvas, MD;  Location: [facility] ENDOSCOPY;  Service: Endoscopy;  Laterality: N/A;  .  ESOPHAGOGASTRODUODENOSCOPY (EGD) WITH PROPOFOL N/A 08/10/2016   Procedure: ESOPHAGOGASTRODUODENOSCOPY (EGD) WITH PROPOFOL;  Surgeon: Manya Silvas, MD;  Location: [facility] ENDOSCOPY;  Service: Endoscopy;  Laterality: N/A;  . FOOT SURGERY Bilateral   . JOINT REPLACEMENT Right 01/27/2016   hip  . ovarian tumor  1976    benign  . TOTAL HIP ARTHROPLASTY Right 01/27/2016   Procedure: TOTAL HIP ARTHROPLASTY;  Surgeon: Dereck Leep, MD;  Location: [facility] ORS;  Service: Orthopedics;  Laterality: Right;    Prior to Admission medications   Medication Sig Start Date End Date Taking? Authorizing Provider  albuterol (VENTOLIN HFA) 108 (90 Base) MCG/ACT inhaler INHALE 1-2 PUFFS INTO THE LUNGS EVERY 6 HOURS AS NEEDED FOR WHEEZING OR SHORTNESS OF BREATH. 05/22/20  Yes Pollak, Adriana M, PA-C  apixaban (ELIQUIS) 5 MG TABS tablet Take 1 tablet (5 mg total) by mouth 2 (two) times daily. 04/23/20  Yes Algernon Huxley, MD  aspirin EC 81 MG tablet Take 81 mg by mouth daily. Swallow whole.   Yes [provider]  betamethasone dipropionate (DIPROLENE) 0.05 % ointment Apply topically daily. 01/01/20  Yes Ralene Bathe, MD  calcipotriene (DOVONOX) 0.005 % cream APPLY TO SKIN ONCE A DAY APPLY DAILY ON HANDS, FEET AND WRIST X 2 WEEKS, THEN USE 5 DAYS A WEEK 01/01/20  Yes Ralene Bathe, MD  Calcipotriene 0.005 % solution PLEASE SEE ATTACHED FOR DETAILED DIRECTIONS 06/21/19  Yes [provider]  Cholecalciferol (VITAMIN D3) 2000 units TABS Take 2,000 Units by mouth daily.   Yes [provider]  clobetasol (TEMOVATE) 0.05 % external solution APPLY TO AFFECTED AREA TWICE A DAY 12/03/19  Yes Bacigalupo, Dionne Bucy, MD  donepezil (ARICEPT) 5 MG tablet Take 5 mg by mouth at bedtime.  04/01/20 05/26/20 Yes [provider]  fluticasone furoate-vilanterol (BREO ELLIPTA) 100-25 MCG/INH AEPB Inhale 1 puff into the lungs daily. 05/22/20  Yes Pollak, Fabio Bering M, PA-C  gabapentin (NEURONTIN) 300 MG capsule TAKE 1 CAPSULE BY MOUTH TWICE A DAY 05/07/20  Yes Bacigalupo, Dionne Bucy, MD  levocetirizine (XYZAL) 5 MG tablet TAKE 1 TABLET BY MOUTH EVERY DAY IN THE EVENING 12/03/19  Yes Bacigalupo, Dionne Bucy, MD  metoprolol succinate (TOPROL-XL) 50 MG 24 hr tablet TAKE 1 TABLET (50 MG TOTAL) BY MOUTH DAILY. TAKE WITH OR IMMEDIATELY  FOLLOWING A MEAL. 03/27/20  Yes Bacigalupo, Dionne Bucy, MD  pantoprazole (PROTONIX) 20 MG tablet TAKE 1 TABLET (20 MG TOTAL) BY MOUTH 2 (TWO) TIMES DAILY BEFORE A MEAL. 12/28/19  Yes Bacigalupo, Dionne Bucy, MD  rOPINIRole (REQUIP) 1 MG tablet TAKE 1 TABLET (1 MG TOTAL) BY MOUTH AT BEDTIME. 12/28/19  Yes Bacigalupo, Dionne Bucy, MD  Secukinumab (COSENTYX SENSOREADY PEN) 150 MG/ML SOAJ Inject into the skin.  04/06/19  Yes [provider]  simvastatin (ZOCOR) 20 MG tablet TAKE 1 TABLET BY MOUTH EVERY DAY 10/01/19  Yes Bacigalupo, Dionne Bucy, MD  traMADol (ULTRAM) 50 MG tablet TAKE 1 TABLET BY MOUTH EVERY 6 HOURS AS NEEDED FOR MODERATE OR SEVERE PAIN 01/01/20  Yes Jerrol Banana., MD    Allergies Clindamycin/lincomycin and Amoxicillin  Family History  Problem Relation Age of Onset  . Cancer Mother        kidney  . Arthritis Father   . Heart disease Father   . COPD Sister   . Fibromyalgia Sister   . Osteoarthritis Sister   . Osteoarthritis Maternal Aunt   . Colon cancer Paternal Aunt   . Colon cancer Paternal Uncle   .  Colon cancer Paternal Grandmother   . Breast cancer Neg Hx     Social History Social History   Tobacco Use  . Smoking status: Current Every Day Smoker    Packs/day: 1.00    Years: 0.00    Pack years: 0.00  . Smokeless tobacco: Never Used  Vaping Use  . Vaping Use: Former  Substance Use Topics  . Alcohol use: Yes    Comment: occasionally glass of wine  . Drug use: No    Review of Systems  Review of Systems   ____________________________________________  PHYSICAL EXAM:      VITAL SIGNS: ED Triage Vitals  Enc Vitals Group     BP 05/26/20 0512 (!) 127/58     Pulse Rate 05/26/20 0512 80     Resp 05/26/20 0512 18     Temp 05/26/20 0512 97.8 F (36.6 C)     Temp Source 05/26/20 0512 Oral     SpO2 05/26/20 0851 98 %     Weight 05/26/20 0508 139 lb 12.4 oz (63.4 kg)     Height 05/26/20 0508 5\' 1"  (1.549 m)     Head Circumference --      Peak Flow --        Pain Score 05/26/20 0508 0     Pain Loc --      Pain Edu? --      Excl. in [place]? --      Physical Exam    ____________________________________________   LABS (all labs ordered are listed, but only abnormal results are displayed)  Labs Reviewed  CBC WITH DIFFERENTIAL/PLATELET - Abnormal; Notable for the following components:      Result Value   WBC 3.2 (*)    RBC 2.31 (*)    Hemoglobin 6.8 (*)    HCT 21.3 (*)    RDW 16.3 (*)    Lymphs Abs 0.6 (*)    All other components within normal limits  COMPREHENSIVE METABOLIC PANEL - Abnormal; Notable for the following components:   Sodium 133 (*)    Creatinine, Ser 1.17 (*)    Calcium 8.3 (*)    Total Protein 5.0 (*)    Albumin 2.6 (*)    Alkaline Phosphatase 152 (*)    GFR, Estimated 49 (*)    All other components within normal limits  BRAIN NATRIURETIC PEPTIDE - Abnormal; Notable for the following components:   B Natriuretic Peptide 554.7 (*)    All other components within normal limits  RESPIRATORY PANEL BY RT PCR (FLU A&B, COVID)  PROCALCITONIN  CBC  CBC  CBC  TYPE AND SCREEN  PREPARE RBC (CROSSMATCH)  TROPONIN I (HIGH SENSITIVITY)  TROPONIN I (HIGH SENSITIVITY)    ____________________________________________  EKG: Normal sinus rhythm with PACs.  Ventricular rate 84.  PR 174, QRS 74, QTc 437.  No acute ST elevations or depressions. ________________________________________  RADIOLOGY All imaging, including plain films, CT scans, and ultrasounds, independently reviewed by me, and interpretations confirmed via formal radiology reads.  ED MD interpretation:   Chest x-ray: Small to moderate pleural effusion CT thigh: Extensive hematoma, no significant underlying fracture, healing pelvic fractures  Official radiology report(s): DG Chest 2 View  Result Date: 05/26/2020 CLINICAL DATA:  Shortness of breath. EXAM: CHEST - 2 VIEW COMPARISON:  CT chest/abdomen/pelvis 04/24/2020. Chest radiographs 04/09/2020. FINDINGS:  Heart size at the upper limits of normal. Aortic atherosclerosis. New from the prior examination of 04/24/2020, there is a small to moderate left pleural effusion with associated left basilar  atelectasis and/or consolidation. The right lung is clear. No evidence of pneumothorax. No acute bony abnormality identified. IMPRESSION: Small to moderate left pleural effusion with associated left basilar atelectasis and/or consolidation, new as compared to the examination of 04/24/2020. Aortic Atherosclerosis (ICD10-I70.0). Electronically Signed   By: Kellie Simmering DO   On: 05/26/2020 06:33   CT FEMUR RIGHT WO CONTRAST  Result Date: 05/26/2020 CLINICAL DATA:  Right thigh hematoma. EXAM: CT OF THE LOWER RIGHT EXTREMITY WITHOUT CONTRAST TECHNIQUE: Multidetector CT imaging of the right lower extremity was performed according to the standard protocol. COMPARISON:  CT chest, abdomen, and pelvis dated April 24, 2020. FINDINGS: Bones/Joint/Cartilage No acute fracture or dislocation. Subacute, healing fractures of right pubic bone and right inferior pubic ramus with associated sclerosis. Prior right total hip arthroplasty. No evidence of hardware failure or loosening. No joint effusion. Mild degenerative changes of the right knee. Osteopenia Ligaments Ligaments are suboptimally evaluated by CT. Muscles and Tendons Grossly intact. Soft tissue Somewhat ill-defined mixed density superficial fluid collection in the right lateral hip overlying the greater trochanter, measuring approximately 6.5 x 5.3 x 13.9 cm. Extensive circumferential soft tissue swelling of the right thigh. Partially visualized prominent soft tissue edema in the medial left thigh. No soft tissue mass. IMPRESSION: 1. Superficial 6.5 x 5.3 x 13.9 cm hematoma in the right lateral hip overlying the greater trochanter. 2. No acute osseous abnormality. Subacute, healing fractures of the right pubic bone and right inferior pubic ramus. 3. Prior right total hip  arthroplasty without evidence of hardware complication. Electronically Signed   By: Titus Dubin M.D.   On: 05/26/2020 10:39    ____________________________________________  PROCEDURES   Procedure(s) performed (including Critical Care):  .Critical Care Performed by: Duffy Bruce, MD Authorized by: Duffy Bruce, MD   Critical care provider statement:    Critical care time (minutes):  35   Critical care time was exclusive of:  Separately billable procedures and treating other patients and teaching time   Critical care was necessary to treat or prevent imminent or life-threatening deterioration of the following conditions:  Cardiac failure, circulatory failure and respiratory failure   Critical care was time spent personally by me on the following activities:  Development of treatment plan with patient or surrogate, discussions with consultants, evaluation of patient's response to treatment, examination of patient, obtaining history from patient or surrogate, ordering and performing treatments and interventions, ordering and review of laboratory studies, ordering and review of radiographic studies, pulse oximetry, re-evaluation of patient's condition and review of old charts   I assumed direction of critical care for this patient from another provider in my specialty: no      ____________________________________________  INITIAL IMPRESSION / MDM / [place] / ED COURSE  As part of my medical decision making, I reviewed the following data within the [place] notes reviewed and incorporated, Old chart reviewed, Notes from prior ED visits, and [place] Controlled Substance [place] was evaluated in Emergency Department on 05/26/2020 for the symptoms described in the history of present illness. She was evaluated in the context of the global COVID-19 pandemic, which necessitated consideration that the patient might be at risk for  infection with the SARS-CoV-2 virus that causes COVID-19. Institutional protocols and algorithms that pertain to the evaluation of patients at risk for COVID-19 are in a state of rapid change based on information released by regulatory bodies including the CDC and federal and  state organizations. These policies and algorithms were followed during the patient's care in the ED.  Some ED evaluations and interventions may be delayed as a result of limited staffing during the pandemic.*     Medical Decision Making:  74 yo F here with SOB after fall. On exam, pt has developed large R thigh hematoma which I suspect has caused a now symptomatic blood loss anemia. Skin is not taught and there are no signs of compartment syndrome or significant ongoing expansion. Labs o/w show slight BNP elevation and CXR does show some edema/effusions, which could also be contributing. Will admit for transfusion, diuresis. Pt updated and in agreement. Pt monitored on tele, no signs of ectopy or arrhythmia noted.  ____________________________________________  FINAL CLINICAL IMPRESSION(S) / ED DIAGNOSES  Final diagnoses:  Blood loss anemia  Hematoma of right thigh, initial encounter  Anticoagulated     MEDICATIONS GIVEN DURING THIS VISIT:  Medications  0.9 %  sodium chloride infusion (Manually program via Guardrails IV Fluids) (has no administration in time range)  ondansetron (ZOFRAN) injection 4 mg (has no administration in time range)  acetaminophen (TYLENOL) tablet 650 mg (has no administration in time range)  hydrALAZINE (APRESOLINE) injection 5 mg (has no administration in time range)  albuterol (VENTOLIN HFA) 108 (90 Base) MCG/ACT inhaler 2 puff (has no administration in time range)  dextromethorphan-guaiFENesin (MUCINEX DM) 30-600 MG per 12 hr tablet 1 tablet (has no administration in time range)  furosemide (LASIX) injection 20 mg (has no administration in time range)  oxyCODONE-acetaminophen  (PERCOCET/ROXICET) 5-325 MG per tablet 1 tablet (1 tablet Oral Given 05/26/20 1313)  morphine 2 MG/ML injection 0.5 mg (has no administration in time range)  traMADol (ULTRAM) tablet 50 mg (50 mg Oral Given 05/26/20 1129)     ED Discharge Orders    None       Note:  This document was prepared using Dragon voice recognition software and may include unintentional dictation errors.   Duffy Bruce, MD 05/26/20 1327

## 2020-05-26 NOTE — ED Notes (Signed)
Dr Isaacs at bedside 

## 2020-05-26 NOTE — H&P (Signed)
History and Physical    Mariah [name] EXN:[id number] DOB: [birth year] DOA: 05/26/2020  Referring MD/NP/PA:   PCP: [name], MD   Patient coming from:  The patient is coming from home.  At baseline, pt is independent for most of ADL.        Chief Complaint: SOB  HPI: Mariah [name] is a 74 y.o. female with medical history significant of DVT on Eliquis, HTN, HLD, COPD, GERD, AAA, psoriasis, CKD stage III, GERD, tobacco abuse, who presents with SOB.   Pt fell on 10/31 and was found to have finger fracture in right hand by Lanetta Inch. Over the past several days, she has noticed increasing pain and bruising to right thigh. She has subsequently developed a large hematoma that has been spreading. She has pain in left thigh which is constant, moderate to severe, sharp, nonradiating.  His pain right hand is mild. She developed SOB in the past 2 days, initially was exertional, but currently shortness of breath is present at rest. Feels like she cannot catch her breath.  Patient denies chest pain, fever or chills.  Patient has mild dry cough.  No nausea vomiting, diarrhea or abdominal pain.  No symptoms of UTI or unilateral weakness.  Patient is taking Eliquis for left leg DVT, last dose was last night per her niece.  ED Course: pt was found to have Hgb 9.7 on 04/12/20 -->6.8, WBC 3.2, negative Covid PCR, BNP 554, troponin level 5, 6, stable renal function, temperature normal, blood pressure 130/87, heart rate 98, RR 18, oxygen saturation 95% on room air.  Chest x-ray with small-moderate left pleural effusion.  Patient is placed on MedSurg bed for observation.  Orthopedic surgery, Dr. Sabra Heck is consulted.  Review of Systems:   General: no fevers, chills, no body weight gain, has fatigue HEENT: no blurry vision, hearing changes or sore throat Respiratory: has dyspnea, coughing, no wheezing CV: no chest pain, no palpitations GI: no nausea, vomiting, abdominal pain, diarrhea, constipation GU: no  dysuria, burning on urination, increased urinary frequency, hematuria  Ext: has leg edema Neuro: no unilateral weakness, numbness, or tingling, no vision change or hearing loss Skin: no skin tear. Has bruise in right thigh MSK: No muscle spasm, no deformity, no limitation of range of movement in spin. Has right hand pain and right thigh hematoma Heme: No easy bruising.  Travel history: No recent long distant travel.  Allergy:  Allergies  Allergen Reactions  . Clindamycin/Lincomycin     diarrhea  . Amoxicillin Diarrhea    Past Medical History:  Diagnosis Date  . Abdominal aortic aneurysm (AAA) ([place])   . Arthritis   . COPD (chronic obstructive pulmonary disease) ([place])   . GERD (gastroesophageal reflux disease)   . Headache    sinus  . History of adenomatous polyp of colon   . Hyperlipidemia   . Hypertension   . Psoriasis   . Right lumbar radiculitis   . Vitamin D deficiency     Past Surgical History:  Procedure Laterality Date  . APPENDECTOMY  2000  . BREAST BIOPSY Right 09/2016   radial scar;COMPLEX SCLEROSING LESION  . BREAST LUMPECTOMY Right 11/24/2016   COMPLEX SCLEROSING LESION  . BREAST LUMPECTOMY WITH NEEDLE LOCALIZATION Right 11/24/2016   Procedure: BREAST LUMPECTOMY WITH NEEDLE LOCALIZATION;  Surgeon: Leonie Green, MD;  Location: [facility] ORS;  Service: General;  Laterality: Right;  . COLONOSCOPY WITH PROPOFOL N/A 08/10/2016   Procedure: COLONOSCOPY WITH PROPOFOL;  Surgeon: Manya Silvas, MD;  Location: [facility] ENDOSCOPY;  Service: Endoscopy;  Laterality: N/A;  . ESOPHAGOGASTRODUODENOSCOPY (EGD) WITH PROPOFOL N/A 08/10/2016   Procedure: ESOPHAGOGASTRODUODENOSCOPY (EGD) WITH PROPOFOL;  Surgeon: Manya Silvas, MD;  Location: [facility] ENDOSCOPY;  Service: Endoscopy;  Laterality: N/A;  . FOOT SURGERY Bilateral   . JOINT REPLACEMENT Right 01/27/2016   hip  . 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 TOTAL HIP ARTHROPLASTY Right 01/27/2016   Procedure: TOTAL HIP ARTHROPLASTY;   Surgeon: Dereck Leep, MD;  Location: [facility] ORS;  Service: Orthopedics;  Laterality: Right;    Social History:  reports that she has been smoking. She has been smoking about 1.00 pack per day for the past 0.00 years. She has never used smokeless tobacco. She reports current alcohol use. She reports that she does not use drugs.  Family History:  Family History  Problem Relation Age of Onset  . Cancer Mother        kidney  . Arthritis Father   . Heart disease Father   . COPD Sister   . Fibromyalgia Sister   . Osteoarthritis Sister   . Osteoarthritis Maternal Aunt   . Colon cancer Paternal Aunt   . Colon cancer Paternal Uncle   . Colon cancer Paternal Grandmother   . Breast cancer Neg Hx      Prior to Admission medications   Medication Sig Start Date End Date Taking? Authorizing Provider  albuterol (VENTOLIN HFA) 108 (90 Base) MCG/ACT inhaler INHALE 1-2 PUFFS INTO THE LUNGS EVERY 6 HOURS AS NEEDED FOR WHEEZING OR SHORTNESS OF BREATH. 05/22/20  Yes Pollak, Adriana M, PA-C  apixaban (ELIQUIS) 5 MG TABS tablet Take 1 tablet (5 mg total) by mouth 2 (two) times daily. 04/23/20  Yes Dew, Erskine Squibb, MD  fluticasone furoate-vilanterol (BREO ELLIPTA) 100-25 MCG/INH AEPB Inhale 1 puff into the lungs daily. 05/22/20  Yes Pollak, Fabio Bering M, PA-C  gabapentin (NEURONTIN) 300 MG capsule TAKE 1 CAPSULE BY MOUTH TWICE A DAY 05/07/20  Yes Bacigalupo, Dionne Bucy, MD  metoprolol succinate (TOPROL-XL) 50 MG 24 hr tablet TAKE 1 TABLET (50 MG TOTAL) BY MOUTH DAILY. TAKE WITH OR IMMEDIATELY FOLLOWING A MEAL. 03/27/20  Yes Bacigalupo, Dionne Bucy, MD  pantoprazole (PROTONIX) 20 MG tablet TAKE 1 TABLET (20 MG TOTAL) BY MOUTH 2 (TWO) TIMES DAILY BEFORE A MEAL. 12/28/19  Yes Bacigalupo, Dionne Bucy, MD  rOPINIRole (REQUIP) 1 MG tablet TAKE 1 TABLET (1 MG TOTAL) BY MOUTH AT BEDTIME. 12/28/19  Yes Bacigalupo, Dionne Bucy, MD  simvastatin (ZOCOR) 20 MG tablet TAKE 1 TABLET BY MOUTH EVERY DAY 10/01/19  Yes Bacigalupo, Dionne Bucy, MD    traMADol (ULTRAM) 50 MG tablet TAKE 1 TABLET BY MOUTH EVERY 6 HOURS AS NEEDED FOR MODERATE OR SEVERE PAIN 01/01/20  Yes Jerrol Banana., MD  aspirin EC 81 MG tablet Take 81 mg by mouth daily. Swallow whole.    [provider]  betamethasone dipropionate (DIPROLENE) 0.05 % ointment Apply topically daily. 01/01/20   Ralene Bathe, MD  calcipotriene (DOVONOX) 0.005 % cream APPLY TO SKIN ONCE A DAY APPLY DAILY ON HANDS, FEET AND WRIST X 2 WEEKS, THEN USE 5 DAYS A WEEK 01/01/20   Ralene Bathe, MD  Calcipotriene 0.005 % solution PLEASE SEE ATTACHED FOR DETAILED DIRECTIONS 06/21/19   [provider]  Cholecalciferol (VITAMIN D3) 2000 units TABS Take 2,000 Units by mouth daily.    [provider]  Cholestyramine POWD Take 1 Package by mouth as needed. 04/11/18   Bacigalupo,  Dionne Bucy, MD  clobetasol (TEMOVATE) 0.05 % external solution APPLY TO AFFECTED AREA TWICE A DAY 12/03/19   Bacigalupo, Dionne Bucy, MD  Clobetasol Propionate 0.05 % shampoo SHAMPOO WITH SMALL AMOUNT TO SKIN ONCE A DAY,SHAMPOO SCALP ONCE DAILY FOR 2 WEEKS THEN 5 DAYS A WEEK 01/01/20   Ralene Bathe, MD  COSENTYX SENSOREADY, 300 MG, 150 MG/ML SOAJ Inject 2 Syringes into the skin every 28 (twenty-eight) days.  06/19/19   [provider]  diphenhydrAMINE (BENADRYL) 25 MG tablet Take 25 mg by mouth at bedtime as needed.    [provider]  donepezil (ARICEPT) 5 MG tablet Take 5 mg by mouth at bedtime. Patient not taking: Reported on 04/23/2020 04/01/20 05/01/20  [provider]  fluticasone (FLONASE) 50 MCG/ACT nasal spray Place 2 sprays into both nostrils daily. Patient not taking: Reported on 05/22/2020 01/10/18   [name], MD  folic acid (FOLVITE) 1 MG tablet Take 1 mg by mouth daily.  Patient not taking: Reported on 05/22/2020 12/02/19   [provider]  hydrochlorothiazide (HYDRODIURIL) 12.5 MG tablet Take 12.5 mg by mouth daily. Patient not taking:  Reported on 05/26/2020    [provider]  levocetirizine (XYZAL) 5 MG tablet TAKE 1 TABLET BY MOUTH EVERY DAY IN THE EVENING Patient not taking: Reported on 05/26/2020 12/03/19   [name], MD  polyethylene glycol powder (GLYCOLAX/MIRALAX) powder Take 17 g by mouth 2 (two) times daily as needed. Patient not taking: Reported on 05/26/2020 08/05/15   Margarita Rana, MD  Secukinumab (COSENTYX SENSOREADY PEN) 150 MG/ML SOAJ Inject into the skin.  04/06/19   [provider]    Physical Exam: Vitals:   05/26/20 1045 05/26/20 1115 05/26/20 1206 05/26/20 1300  BP: (!) 118/56 118/87 101/88   Pulse: 95 93 89 85  Resp: 18 18 14 19   Temp: 97.7 F (36.5 C) 97.6 F (36.4 C) 97.8 F (36.6 C) (!) 97.5 F (36.4 C)  TempSrc: Axillary Axillary Oral Axillary  SpO2: 95%  95% 96%  Weight:      Height:       General: Not in acute distress. Pale looking. HEENT:       Eyes: PERRL, EOMI, no scleral icterus.       ENT: No discharge from the ears and nose, no pharynx injection, no tonsillar enlargement.        Neck: No JVD, no bruit, no mass felt. Heme: No neck lymph node enlargement. Cardiac: S1/S2, RRR, No murmurs, No gallops or rubs. Respiratory: No rales, wheezing, rhonchi or rubs. GI: Soft, nondistended, nontender, no rebound pain, no organomegaly, BS present. GU: No hematuria Ext: has pitting leg edema bilaterally (right is worse than the left). 1+DP/PT pulse bilaterally. Musculoskeletal: No joint deformities, No joint redness or warmth, no limitation of ROM in spin. Has a large hematoma in right thigh. Skin: No rashes.  Has large area of bruise in right lateral thigh Neuro: Alert, oriented X3, cranial nerves II-XII grossly intact, moves all extremities. Psych: Patient is not psychotic, no suicidal or hemocidal ideation.  Labs on Admission: I have personally reviewed following labs and imaging studies  CBC: Recent Labs  Lab 05/26/20 0518  WBC 3.2*  NEUTROABS 2.4  HGB  6.8*  HCT 21.3*  MCV 92.2  PLT 921   Basic Metabolic Panel: Recent Labs  Lab 05/26/20 0518  NA 133*  K 4.8  CL 100  CO2 26  GLUCOSE 92  BUN 20  CREATININE 1.17*  CALCIUM  8.3*   GFR: Estimated Creatinine Clearance: 35.4 mL/min (A) (by C-G formula based on SCr of 1.17 mg/dL (H)). Liver Function Tests: Recent Labs  Lab 05/26/20 0518  AST 22  ALT 18  ALKPHOS 152*  BILITOT 0.9  PROT 5.0*  ALBUMIN 2.6*   No results for input(s): LIPASE, AMYLASE in the last 168 hours. No results for input(s): AMMONIA in the last 168 hours. Coagulation Profile: No results for input(s): INR, PROTIME in the last 168 hours. Cardiac Enzymes: No results for input(s): CKTOTAL, CKMB, CKMBINDEX, TROPONINI in the last 168 hours. BNP (last 3 results) No results for input(s): PROBNP in the last 8760 hours. HbA1C: No results for input(s): HGBA1C in the last 72 hours. CBG: No results for input(s): GLUCAP in the last 168 hours. Lipid Profile: No results for input(s): CHOL, HDL, LDLCALC, TRIG, CHOLHDL, LDLDIRECT in the last 72 hours. Thyroid Function Tests: No results for input(s): TSH, T4TOTAL, FREET4, T3FREE, THYROIDAB in the last 72 hours. Anemia Panel: No results for input(s): VITAMINB12, FOLATE, FERRITIN, TIBC, IRON, RETICCTPCT in the last 72 hours. Urine analysis:    Component Value Date/Time   COLORURINE STRAW (A) 01/15/2016 1329   APPEARANCEUR CLEAR (A) 01/15/2016 1329   LABSPEC 1.002 (L) 01/15/2016 1329   PHURINE 7.0 01/15/2016 1329   GLUCOSEU NEGATIVE 01/15/2016 1329   HGBUR NEGATIVE 01/15/2016 1329   BILIRUBINUR Negative 03/04/2020 0931   KETONESUR NEGATIVE 01/15/2016 1329   PROTEINUR Negative 03/04/2020 0931   PROTEINUR NEGATIVE 01/15/2016 1329   UROBILINOGEN 0.2 03/04/2020 0931   NITRITE Negative 03/04/2020 0931   NITRITE NEGATIVE 01/15/2016 1329   LEUKOCYTESUR Negative 03/04/2020 0931   Sepsis Labs: @LABRCNTIP (procalcitonin:4,lacticidven:4) ) Recent Results (from the past  240 hour(s))  Respiratory Panel by RT PCR (Flu A&B, Covid) - Nasopharyngeal Swab     Status: None   Collection Time: 05/26/20  9:47 AM   Specimen: Nasopharyngeal Swab  Result Value Ref Range Status   SARS Coronavirus 2 by RT PCR NEGATIVE NEGATIVE Final    Comment: (NOTE) SARS-CoV-2 target nucleic acids are NOT DETECTED.  The SARS-CoV-2 RNA is generally detectable in upper respiratoy specimens during the acute phase of infection. The lowest concentration of SARS-CoV-2 viral copies this assay can detect is 131 copies/mL. A negative result does not preclude SARS-Cov-2 infection and should not be used as the sole basis for treatment or other patient management decisions. A negative result may occur with  improper specimen collection/handling, submission of specimen other than nasopharyngeal swab, presence of viral mutation(s) within the areas targeted by this assay, and inadequate number of viral copies (<131 copies/mL). A negative result must be combined with clinical observations, patient history, and epidemiological information. The expected result is Negative.  Fact Sheet for Patients:  PinkCheek.be  Fact Sheet for Healthcare Providers:  GravelBags.it  This test is no t yet approved or cleared by the [place] FDA and  has been authorized for detection and/or diagnosis of SARS-CoV-2 by FDA under an Emergency Use Authorization (EUA). This EUA will remain  in effect (meaning this test can be used) for the duration of the COVID-19 declaration under Section 564(b)(1) of the Act, 21 U.S.C. section 360bbb-3(b)(1), unless the authorization is terminated or revoked sooner.     Influenza A by PCR NEGATIVE NEGATIVE Final   Influenza B by PCR NEGATIVE NEGATIVE Final    Comment: (NOTE) The Xpert Xpress SARS-CoV-2/FLU/RSV assay is intended as an aid in  the diagnosis of influenza from Nasopharyngeal swab specimens and  should  not be used as a sole basis for treatment. Nasal washings and  aspirates are unacceptable for Xpert Xpress SARS-CoV-2/FLU/RSV  testing.  Fact Sheet for Patients: PinkCheek.be  Fact Sheet for Healthcare Providers: GravelBags.it  This test is not yet approved or cleared by the [place] FDA and  has been authorized for detection and/or diagnosis of SARS-CoV-2 by  FDA under an Emergency Use Authorization (EUA). This EUA will remain  in effect (meaning this test can be used) for the duration of the  Covid-19 declaration under Section 564(b)(1) of the Act, 21  U.S.C. section 360bbb-3(b)(1), unless the authorization is  terminated or revoked. Performed at [facility], [address], [place], [place] [address]      Radiological Exams on Admission: DG Chest 2 View  Result Date: 05/26/2020 CLINICAL DATA:  Shortness of breath. EXAM: CHEST - 2 VIEW COMPARISON:  CT chest/abdomen/pelvis 04/24/2020. Chest radiographs 04/09/2020. FINDINGS: Heart size at the upper limits of normal. Aortic atherosclerosis. New from the prior examination of 04/24/2020, there is a small to moderate left pleural effusion with associated left basilar atelectasis and/or consolidation. The right lung is clear. No evidence of pneumothorax. No acute bony abnormality identified. IMPRESSION: Small to moderate left pleural effusion with associated left basilar atelectasis and/or consolidation, new as compared to the examination of 04/24/2020. Aortic Atherosclerosis (ICD10-I70.0). Electronically Signed   By: Kellie Simmering DO   On: 05/26/2020 06:33   CT FEMUR RIGHT WO CONTRAST  Result Date: 05/26/2020 CLINICAL DATA:  Right thigh hematoma. EXAM: CT OF THE LOWER RIGHT EXTREMITY WITHOUT CONTRAST TECHNIQUE: Multidetector CT imaging of the right lower extremity was performed according to the standard protocol. COMPARISON:  CT chest, abdomen, and pelvis dated April 24, 2020. FINDINGS: Bones/Joint/Cartilage No acute fracture or dislocation. Subacute, healing fractures of right pubic bone and right inferior pubic ramus with associated sclerosis. Prior right total hip arthroplasty. No evidence of hardware failure or loosening. No joint effusion. Mild degenerative changes of the right knee. Osteopenia Ligaments Ligaments are suboptimally evaluated by CT. Muscles and Tendons Grossly intact. Soft tissue Somewhat ill-defined mixed density superficial fluid collection in the right lateral hip overlying the greater trochanter, measuring approximately 6.5 x 5.3 x 13.9 cm. Extensive circumferential soft tissue swelling of the right thigh. Partially visualized prominent soft tissue edema in the medial left thigh. No soft tissue mass. IMPRESSION: 1. Superficial 6.5 x 5.3 x 13.9 cm hematoma in the right lateral hip overlying the greater trochanter. 2. No acute osseous abnormality. Subacute, healing fractures of the right pubic bone and right inferior pubic ramus. 3. Prior right total hip arthroplasty without evidence of hardware complication. Electronically Signed   By: Titus Dubin M.D.   On: 05/26/2020 10:39     EKG: I have personally reviewed.  Sinus rhythm, QTC 437, low voltage, PVC, poor R wave progression  Assessment/Plan Principal Problem:   Symptomatic anemia Active Problems:   Essential (primary) hypertension   Hypercholesteremia   Gastro-esophageal reflux disease without esophagitis   Compulsive tobacco user syndrome   Chronic obstructive pulmonary disease (HCC)   Acute deep vein thrombosis (DVT) of tibial vein of left lower extremity (HCC)   CKD (chronic kidney disease), stage III (HCC)   Hematoma of right thigh   Finger fracture, right   SOB (shortness of breath)   Symptomatic anemia and blood loss anemia 2/2 large hematoma of right thigh : Patient has shortness of breath.  Hgb 9.7 -->6.8. likely due to blood loss secondary to large right  thigh hematoma.   Patient will is taking Eliquis, last dose was last night per her niece. Dr. Sabra Heck of ortho is consulted. -Placed on MedSurg bed for observation -Transfuse 1 unit of blood - hold ASA and Eliquis - prn percocet and morphine for pain -Check INR/PTT/type screen -Follow-up CBC every 6 hours  SOB: Patient states shortness of breath is likely due to symptomatic anemia, but the patient also has bilateral leg edema, chest x-ray showed small-moderate left pleural effusion, BNP is elevated at 554, will need to r/o CHF. -Will give 20 mg IV Lasix now -Follow-up 2D echo  Essential (primary) hypertension: -IV hydralazine as needed -Metoprolol  Hypercholesteremia -Zocor  Gastro-esophageal reflux disease without esophagitis -Protonix  Compulsive tobacco user syndrome -Nicotine patch  Chronic obstructive pulmonary disease (HCC): Stable -Bronchodilators  Acute deep vein thrombosis (DVT) of tibial vein of left lower extremity ([place]): No history of PE. -Hold Eliquis now   CKD (chronic kidney disease), stage IIIa ([place]): Stable  Finger fracture, right: -pain control as above -f/u ortho recommendations         DVT ppx: none (Patient has hematoma, cannot use Lovenox or heparin; patient has history of left leg DVT, cannot use SCD to left letg. pt has right thigh hematoma, cannot use SCD to the right leg) Code Status: Full code Family Communication: Yes, patient's  niece  at bed side Disposition Plan:  Anticipate discharge back to previous environment Consults called:  Ortho, Dr. Sabra Heck Admission status: Med-surg bed for obs   Status is: Observation  The patient remains OBS appropriate and will d/c before 2 midnights.  Dispo: The patient is from: Home              Anticipated d/c is to: Home              Anticipated d/c date is: 1 day              Patient currently is not medically stable to d/c.             Date of Service 05/26/2020    [facility]   If 7PM-7AM, please contact night-coverage [email or web address] 05/26/2020, 1:14 PM

## 2020-05-26 NOTE — ED Triage Notes (Signed)
Patient reports being short of breath for 2 days.

## 2020-05-26 NOTE — ED Notes (Signed)
Pt reports from a previous fall has bruise to right leg, right lower leg whipping, swollen. Pt reports has been "sweating" for past 2 days. Pt talks in complete sentences, pt's niece at bedside

## 2020-05-26 NOTE — Consult Note (Signed)
ORTHOPAEDIC CONSULTATION  REQUESTING PHYSICIAN: Ivor Costa, MD  Chief Complaint: Right hand pain and swelling and bruising of the right leg  HPI: Mariah [name] is a 74 y.o. female who complains of right hand pain as a result of a fall on 05/19/2020.  She was seen in the emergency room 05/22/2020 and noted and x-rays to have fractures of the distal phalanges of the right thumb and small finger.  Over several days after the fall she also developed swelling and bleeding in the right right thigh and leg.  She came into the emergency room today with a hemoglobin dropped to 6.8.  She has been on Eliquis for a DVT in the left leg several months ago.  Past Medical History:  Diagnosis Date  . Abdominal aortic aneurysm (AAA) ([place])   . Arthritis   . COPD (chronic obstructive pulmonary disease) ([place])   . GERD (gastroesophageal reflux disease)   . Headache    sinus  . History of adenomatous polyp of colon   . Hyperlipidemia   . Hypertension   . Psoriasis   . Right lumbar radiculitis   . Vitamin D deficiency    Past Surgical History:  Procedure Laterality Date  . APPENDECTOMY  2000  . BREAST BIOPSY Right 09/2016   radial scar;COMPLEX SCLEROSING LESION  . BREAST LUMPECTOMY Right 11/24/2016   COMPLEX SCLEROSING LESION  . BREAST LUMPECTOMY WITH NEEDLE LOCALIZATION Right 11/24/2016   Procedure: BREAST LUMPECTOMY WITH NEEDLE LOCALIZATION;  Surgeon: Leonie Green, MD;  Location: [facility] ORS;  Service: General;  Laterality: Right;  . COLONOSCOPY WITH PROPOFOL N/A 08/10/2016   Procedure: COLONOSCOPY WITH PROPOFOL;  Surgeon: Manya Silvas, MD;  Location: [facility] ENDOSCOPY;  Service: Endoscopy;  Laterality: N/A;  . ESOPHAGOGASTRODUODENOSCOPY (EGD) WITH PROPOFOL N/A 08/10/2016   Procedure: ESOPHAGOGASTRODUODENOSCOPY (EGD) WITH PROPOFOL;  Surgeon: Manya Silvas, MD;  Location: [facility] ENDOSCOPY;  Service: Endoscopy;  Laterality: N/A;  . FOOT SURGERY Bilateral   . JOINT REPLACEMENT Right 01/27/2016    hip  . 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 TOTAL HIP ARTHROPLASTY Right 01/27/2016   Procedure: TOTAL HIP ARTHROPLASTY;  Surgeon: Dereck Leep, MD;  Location: [facility] ORS;  Service: Orthopedics;  Laterality: Right;   Social History   Socioeconomic History  . Marital status: Divorced    Spouse name: Not on file  . Number of children: 0  . Years of education: 19  . Highest education level: Associate degree: occupational, Hotel manager, or vocational program  Occupational History    Comment: retired  Tobacco Use  . Smoking status: Current Every Day Smoker    Packs/day: 1.00    Years: 0.00    Pack years: 0.00  . Smokeless tobacco: Never Used  Vaping Use  . Vaping Use: Former  Substance and Sexual Activity  . Alcohol use: Yes    Comment: occasionally glass of wine  . Drug use: No  . Sexual activity: Not on file  Other Topics Concern  . Not on file  Social History Narrative   Lives self; in [place]; smoke 1ppd; no alcohol. 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    Social Determinants of Health   Financial Resource Strain: Medium Risk  . Difficulty of Paying Living Expenses: Somewhat hard  Food Insecurity: No Food Insecurity  . Worried About Charity fundraiser in the Last Year: Never true  . Ran Out of Food in the Last Year: Never true  Transportation Needs: No Transportation Needs  .  Lack of Transportation (Medical): No  . Lack of Transportation (Non-Medical): No  Physical Activity: Inactive  . Days of Exercise per Week: 0 days  . Minutes of Exercise per Session: 0 min  Stress: No Stress Concern Present  . Feeling of Stress : Not at all  Social Connections: Moderately Integrated  . Frequency of Communication with Friends and Family: More than three times a week  . Frequency of Social Gatherings with Friends and Family: Twice a week  . Attends Religious Services: More than 4 times per year  . Active Member of Clubs or Organizations: Yes  . Attends Theatre manager Meetings: More than 4 times per year  . Marital Status: Divorced   Family History  Problem Relation Age of Onset  . Cancer Mother        kidney  . Arthritis Father   . Heart disease Father   . COPD Sister   . Fibromyalgia Sister   . Osteoarthritis Sister   . Osteoarthritis Maternal Aunt   . Colon cancer Paternal Aunt   . Colon cancer Paternal Uncle   . Colon cancer Paternal Grandmother   . Breast cancer Neg Hx    Allergies  Allergen Reactions  . Clindamycin/Lincomycin     diarrhea  . Amoxicillin Diarrhea   Prior to Admission medications   Medication Sig Start Date End Date Taking? Authorizing Provider  albuterol (VENTOLIN HFA) 108 (90 Base) MCG/ACT inhaler INHALE 1-2 PUFFS INTO THE LUNGS EVERY 6 HOURS AS NEEDED FOR WHEEZING OR SHORTNESS OF BREATH. 05/22/20  Yes Pollak, Adriana M, PA-C  apixaban (ELIQUIS) 5 MG TABS tablet Take 1 tablet (5 mg total) by mouth 2 (two) times daily. 04/23/20  Yes Algernon Huxley, MD  aspirin EC 81 MG tablet Take 81 mg by mouth daily. Swallow whole.   Yes [provider]  betamethasone dipropionate (DIPROLENE) 0.05 % ointment Apply topically daily. 01/01/20  Yes Ralene Bathe, MD  calcipotriene (DOVONOX) 0.005 % cream APPLY TO SKIN ONCE A DAY APPLY DAILY ON HANDS, FEET AND WRIST X 2 WEEKS, THEN USE 5 DAYS A WEEK 01/01/20  Yes Ralene Bathe, MD  Calcipotriene 0.005 % solution PLEASE SEE ATTACHED FOR DETAILED DIRECTIONS 06/21/19  Yes [provider]  Cholecalciferol (VITAMIN D3) 2000 units TABS Take 2,000 Units by mouth daily.   Yes [provider]  clobetasol (TEMOVATE) 0.05 % external solution APPLY TO AFFECTED AREA TWICE A DAY 12/03/19  Yes Bacigalupo, Dionne Bucy, MD  donepezil (ARICEPT) 5 MG tablet Take 5 mg by mouth at bedtime.  04/01/20 05/26/20 Yes [provider]  fluticasone furoate-vilanterol (BREO ELLIPTA) 100-25 MCG/INH AEPB Inhale 1 puff into the lungs daily. 05/22/20  Yes Pollak, Fabio Bering M, PA-C   gabapentin (NEURONTIN) 300 MG capsule TAKE 1 CAPSULE BY MOUTH TWICE A DAY 05/07/20  Yes Bacigalupo, Dionne Bucy, MD  levocetirizine (XYZAL) 5 MG tablet TAKE 1 TABLET BY MOUTH EVERY DAY IN THE EVENING 12/03/19  Yes Bacigalupo, Dionne Bucy, MD  metoprolol succinate (TOPROL-XL) 50 MG 24 hr tablet TAKE 1 TABLET (50 MG TOTAL) BY MOUTH DAILY. TAKE WITH OR IMMEDIATELY FOLLOWING A MEAL. 03/27/20  Yes Bacigalupo, Dionne Bucy, MD  pantoprazole (PROTONIX) 20 MG tablet TAKE 1 TABLET (20 MG TOTAL) BY MOUTH 2 (TWO) TIMES DAILY BEFORE A MEAL. 12/28/19  Yes Bacigalupo, Dionne Bucy, MD  rOPINIRole (REQUIP) 1 MG tablet TAKE 1 TABLET (1 MG TOTAL) BY MOUTH AT BEDTIME. 12/28/19  Yes Bacigalupo, Dionne Bucy, MD  Secukinumab (COSENTYX SENSOREADY  PEN) 150 MG/ML SOAJ Inject into the skin.  04/06/19  Yes [provider]  simvastatin (ZOCOR) 20 MG tablet TAKE 1 TABLET BY MOUTH EVERY DAY 10/01/19  Yes Bacigalupo, Dionne Bucy, MD  traMADol (ULTRAM) 50 MG tablet TAKE 1 TABLET BY MOUTH EVERY 6 HOURS AS NEEDED FOR MODERATE OR SEVERE PAIN 01/01/20  Yes Jerrol Banana., MD   DG Chest 2 View  Result Date: 05/26/2020 CLINICAL DATA:  Shortness of breath. EXAM: CHEST - 2 VIEW COMPARISON:  CT chest/abdomen/pelvis 04/24/2020. Chest radiographs 04/09/2020. FINDINGS: Heart size at the upper limits of normal. Aortic atherosclerosis. New from the prior examination of 04/24/2020, there is a small to moderate left pleural effusion with associated left basilar atelectasis and/or consolidation. The right lung is clear. No evidence of pneumothorax. No acute bony abnormality identified. IMPRESSION: Small to moderate left pleural effusion with associated left basilar atelectasis and/or consolidation, new as compared to the examination of 04/24/2020. Aortic Atherosclerosis (ICD10-I70.0). Electronically Signed   By: Kellie Simmering DO   On: 05/26/2020 06:33   CT FEMUR RIGHT WO CONTRAST  Result Date: 05/26/2020 CLINICAL DATA:  Right thigh hematoma. EXAM: CT OF THE  LOWER RIGHT EXTREMITY WITHOUT CONTRAST TECHNIQUE: Multidetector CT imaging of the right lower extremity was performed according to the standard protocol. COMPARISON:  CT chest, abdomen, and pelvis dated April 24, 2020. FINDINGS: Bones/Joint/Cartilage No acute fracture or dislocation. Subacute, healing fractures of right pubic bone and right inferior pubic ramus with associated sclerosis. Prior right total hip arthroplasty. No evidence of hardware failure or loosening. No joint effusion. Mild degenerative changes of the right knee. Osteopenia Ligaments Ligaments are suboptimally evaluated by CT. Muscles and Tendons Grossly intact. Soft tissue Somewhat ill-defined mixed density superficial fluid collection in the right lateral hip overlying the greater trochanter, measuring approximately 6.5 x 5.3 x 13.9 cm. Extensive circumferential soft tissue swelling of the right thigh. Partially visualized prominent soft tissue edema in the medial left thigh. No soft tissue mass. IMPRESSION: 1. Superficial 6.5 x 5.3 x 13.9 cm hematoma in the right lateral hip overlying the greater trochanter. 2. No acute osseous abnormality. Subacute, healing fractures of the right pubic bone and right inferior pubic ramus. 3. Prior right total hip arthroplasty without evidence of hardware complication. Electronically Signed   By: Titus Dubin M.D.   On: 05/26/2020 10:39    Positive ROS: All other systems have been reviewed and were otherwise negative with the exception of those mentioned in the HPI and as above.  Physical Exam: General: Alert, no acute distress Cardiovascular: No pedal edema Respiratory: No cyanosis, no use of accessory musculature GI: No organomegaly, abdomen is soft and non-tender Skin: No lesions in the area of chief complaint Neurologic: Sensation intact distally Psychiatric: Patient is competent for consent with normal mood and affect Lymphatic: No axillary or cervical lymphadenopathy  MUSCULOSKELETAL:  The right hand shows tenderness of the distal phalanges of the right thumb and small finger.  There is no malalignment.  Neurovascular status is good. Right leg shows extensive ecchymosis and bruising.  There is area of fluid collection near the greater trochanter.  Neurovascular status is intact distally.  Knee and hip motions intact.  Assessment: 1 fractures of the right thumb and small finger distal phalanges 2 bleeding hematoma right thigh secondary to trauma and Eliquis  Plan: 1. the thumb and small fingers on the right hand were splinted. 2. the right leg will be wrapped with Ace bandages from the foot to the groin.  3.  The medical service will address blood replacement and anticoagulation changes.   Park Breed, MD [phone]   05/26/2020 10:57 PM

## 2020-05-26 NOTE — ED Notes (Signed)
Advised nurse that patient has assigned bed 

## 2020-05-26 NOTE — ED Notes (Signed)
Pt unable to sign electronic consent form signature pad in room did not work

## 2020-05-27 ENCOUNTER — Ambulatory Visit: Payer: Self-pay | Admitting: Physician Assistant

## 2020-05-27 ENCOUNTER — Observation Stay (HOSPITAL_BASED_OUTPATIENT_CLINIC_OR_DEPARTMENT_OTHER)
Admit: 2020-05-27 | Discharge: 2020-05-27 | Disposition: A | Discharge status: Home / Self Care | Payer: PPO | Attending: Internal Medicine | Admitting: Internal Medicine

## 2020-05-27 ENCOUNTER — Telehealth: Payer: Self-pay

## 2020-05-27 DIAGNOSIS — I5031 Acute diastolic (congestive) heart failure: Secondary | ICD-10-CM | POA: Diagnosis not present

## 2020-05-27 DIAGNOSIS — S7011XA Contusion of right thigh, initial encounter: Secondary | ICD-10-CM | POA: Diagnosis not present

## 2020-05-27 DIAGNOSIS — D649 Anemia, unspecified: Secondary | ICD-10-CM

## 2020-05-27 LAB — CBC
HCT: 27.6 % — ABNORMAL LOW (ref 36.0–46.0)
HCT: 28.1 % — ABNORMAL LOW (ref 36.0–46.0)
HCT: 25.6 % — ABNORMAL LOW (ref 36.0–46.0)
Hemoglobin: 9.2 g/dL — ABNORMAL LOW (ref 12.0–15.0)
Hemoglobin: 9.1 g/dL — ABNORMAL LOW (ref 12.0–15.0)
Hemoglobin: 8.4 g/dL — ABNORMAL LOW (ref 12.0–15.0)
MCH: 30.1 pg (ref 26.0–34.0)
MCH: 30 pg (ref 26.0–34.0)
MCH: 30 pg (ref 26.0–34.0)
MCHC: 33.3 g/dL (ref 30.0–36.0)
MCHC: 32.4 g/dL (ref 30.0–36.0)
MCHC: 32.8 g/dL (ref 30.0–36.0)
MCV: 90.2 fL (ref 80.0–100.0)
MCV: 92.7 fL (ref 80.0–100.0)
MCV: 91.4 fL (ref 80.0–100.0)
Platelets: 150 10*3/uL (ref 150–400)
Platelets: 174 10*3/uL (ref 150–400)
Platelets: 164 10*3/uL (ref 150–400)
RBC: 3.06 MIL/uL — ABNORMAL LOW (ref 3.87–5.11)
RBC: 3.03 MIL/uL — ABNORMAL LOW (ref 3.87–5.11)
RBC: 2.8 MIL/uL — ABNORMAL LOW (ref 3.87–5.11)
RDW: 15.2 % (ref 11.5–15.5)
RDW: 15.2 % (ref 11.5–15.5)
RDW: 15.1 % (ref 11.5–15.5)
WBC: 2.8 10*3/uL — ABNORMAL LOW (ref 4.0–10.5)
WBC: 4.9 10*3/uL (ref 4.0–10.5)
WBC: 4.3 10*3/uL (ref 4.0–10.5)
nRBC: 0 % (ref 0.0–0.2)
nRBC: 0 % (ref 0.0–0.2)
nRBC: 0 % (ref 0.0–0.2)

## 2020-05-27 LAB — BASIC METABOLIC PANEL
Anion gap: 6 (ref 5–15)
BUN: 19 mg/dL (ref 8–23)
CO2: 30 mmol/L (ref 22–32)
Calcium: 8.2 mg/dL — ABNORMAL LOW (ref 8.9–10.3)
Chloride: 98 mmol/L (ref 98–111)
Creatinine, Ser: 1.14 mg/dL — ABNORMAL HIGH (ref 0.44–1.00)
GFR, Estimated: 50 mL/min — ABNORMAL LOW (ref >60)
Glucose, Bld: 76 mg/dL (ref 70–99)
Potassium: 4.2 mmol/L (ref 3.5–5.1)
Sodium: 134 mmol/L — ABNORMAL LOW (ref 135–145)

## 2020-05-27 LAB — TYPE AND SCREEN
ABO/RH(D): B NEG
Antibody Screen: NEGATIVE
Unit division: 0

## 2020-05-27 LAB — GLUCOSE, CAPILLARY
Glucose-Capillary: 63 mg/dL — ABNORMAL LOW (ref 70–99)
Glucose-Capillary: 52 mg/dL — ABNORMAL LOW (ref 70–99)
Glucose-Capillary: 82 mg/dL (ref 70–99)

## 2020-05-27 LAB — BPAM RBC
Blood Product Expiration Date: 202112052359
ISSUE DATE / TIME: 202111071001
Unit Type and Rh: 1700

## 2020-05-27 MED ORDER — GLUCOSE 40 % PO GEL
ORAL | Status: AC
  Administered 2020-05-27: 37.5 g via ORAL
  Filled 2020-05-27: qty 1

## 2020-05-27 NOTE — Addendum Note (Signed)
Addended by: Trinna Post on: 05/27/2020 01:14 PM   Modules accepted: Orders

## 2020-05-27 NOTE — Discharge Summary (Addendum)
Physician Discharge Summary  Mariah [name]:[id number] DOB: Jul 04, 1946 DOA: 05/26/2020  PCP: [name], MD  Admit date: 05/26/2020 Discharge date: 05/28/2020  Admitted From: Home Disposition: Home   Recommendations for Outpatient Follow-up:  1. Follow up with PCP in 1-2 weeks 2. Follow-up with orthopedic 3. Please obtain BMP/CBC in one week 4. Please follow up on the following pending results: Echo results  Home Health: Yes Equipment/Devices: Rolling walker Discharge Condition: Stable CODE STATUS: Full Diet recommendation: Heart Healthy / Carb Modified   Brief/Interim Summary: Mariah [name] is a 74 y.o. female with medical history significant of DVT on Eliquis, HTN, HLD, COPD ON 2L at home, GERD, AAA, psoriasis, CKD stage III, GERD, tobacco abuse, who presents with SOB.   Pt fell on 10/31 and was found to have finger fracture in right hand by Lanetta Inch. Over the past several days, she has noticed increasing pain and bruising to right thigh. She has subsequently developed a large hematoma that has been spreading. She has pain in left thigh which is constant, moderate to severe, sharp, nonradiating.  His pain right hand is mild. She developed SOB in the past 2 days, initially was exertional, but currently shortness of breath is present at rest. Feels like she cannot catch her breath.  Patient denies chest pain, fever or chills.  Patient has mild dry cough.  No nausea vomiting, diarrhea or abdominal pain.  No symptoms of UTI or unilateral weakness.  Patient is taking Eliquis for left leg DVT, last dose was last night per her niece.  Patient was found to have an hemoglobin of 6.8, it was 9.84-month prior, most likely's secondary to a large right thigh hematoma with fall. He received 1 unit of PRBC and hemoglobin improved to 9.2.  Orthopedic evaluated her and they splinted her to digits and advise Ace wrap and elevation of leg.  She was advised to start taking Eliquis and aspirin for 1  week and after that she can resume it.  She will need a close follow-up with primary care provider to monitor her hemoglobin.  Patient was having some shortness of breath, saturating well on room air, BNP mildly elevated at 554.  Patient denies any history of CHF.  Acute blood loss anemia might be contributory to her dyspnea.  Chest x-ray with a small left pleural effusion.  She did received some IV Lasix and an echocardiogram was done pending results before discharge and her primary care provider should be able to follow-up on that.  Patient will continue with rest of her home meds and follow-up with her providers.  Discharge Diagnoses:  Principal Problem:   Symptomatic anemia Active Problems:   Essential (primary) hypertension   Hypercholesteremia   Gastro-esophageal reflux disease without esophagitis   Compulsive tobacco user syndrome   Chronic obstructive pulmonary disease (HCC)   Acute deep vein thrombosis (DVT) of tibial vein of left lower extremity (HCC)   CKD (chronic kidney disease), stage III (HCC)   Hematoma of right thigh   Finger fracture, right   SOB (shortness of breath)   Discharge Instructions  Discharge Instructions    Diet - low sodium heart healthy   Complete by: As directed    Discharge instructions   Complete by: As directed    It was pleasure taking care of you. Please continue holding your aspirin and Eliquis for 1 week and then resume it. Please wrap your right leg with Ace wrap and keep it elevated. Please follow-up with your primary  care provider within a week and have your CBC checked.   Increase activity slowly   Complete by: As directed      Allergies as of 05/28/2020      Reactions   Clindamycin/lincomycin    diarrhea   Amoxicillin Diarrhea      Medication List    TAKE these medications   albuterol 108 (90 Base) MCG/ACT inhaler Commonly known as: VENTOLIN HFA INHALE 1-2 PUFFS INTO THE LUNGS EVERY 6 HOURS AS NEEDED FOR WHEEZING OR  SHORTNESS OF BREATH.   apixaban 5 MG Tabs tablet Commonly known as: ELIQUIS Take 1 tablet (5 mg total) by mouth 2 (two) times daily.   aspirin EC 81 MG tablet Take 81 mg by mouth daily. Swallow whole.   betamethasone dipropionate 0.05 % ointment Commonly known as: DIPROLENE Apply topically daily.   Breo Ellipta 100-25 MCG/INH Aepb Generic drug: fluticasone furoate-vilanterol Inhale 1 puff into the lungs daily.   calcipotriene 0.005 % cream Commonly known as: DOVONOX APPLY TO SKIN ONCE A DAY APPLY DAILY ON HANDS, FEET AND WRIST X 2 WEEKS, THEN USE 5 DAYS A WEEK What changed: Another medication with the same name was removed. Continue taking this medication, and follow the directions you see here.   clobetasol 0.05 % external solution Commonly known as: TEMOVATE APPLY TO AFFECTED AREA TWICE A DAY   Cosentyx Sensoready Pen 150 MG/ML Soaj Generic drug: Secukinumab Inject into the skin.   donepezil 5 MG tablet Commonly known as: ARICEPT Take 5 mg by mouth at bedtime.   ferrous sulfate 325 (65 FE) MG tablet Take 1 tablet (325 mg total) by mouth 2 (two) times daily with a meal.   gabapentin 300 MG capsule Commonly known as: NEURONTIN TAKE 1 CAPSULE BY MOUTH TWICE A DAY   levocetirizine 5 MG tablet Commonly known as: XYZAL TAKE 1 TABLET BY MOUTH EVERY DAY IN THE EVENING   metoprolol succinate 50 MG 24 hr tablet Commonly known as: TOPROL-XL TAKE 1 TABLET (50 MG TOTAL) BY MOUTH DAILY. TAKE WITH OR IMMEDIATELY FOLLOWING A MEAL.   pantoprazole 20 MG tablet Commonly known as: PROTONIX TAKE 1 TABLET (20 MG TOTAL) BY MOUTH 2 (TWO) TIMES DAILY BEFORE A MEAL.   rOPINIRole 1 MG tablet Commonly known as: REQUIP TAKE 1 TABLET (1 MG TOTAL) BY MOUTH AT BEDTIME.   simvastatin 20 MG tablet Commonly known as: ZOCOR TAKE 1 TABLET BY MOUTH EVERY DAY   traMADol 50 MG tablet Commonly known as: ULTRAM TAKE 1 TABLET BY MOUTH EVERY 6 HOURS AS NEEDED FOR MODERATE OR SEVERE PAIN    Vitamin D3 50 MCG (2000 UT) Tabs Take 2,000 Units by mouth daily.       Follow-up Information    Bacigalupo, Dionne Bucy, MD. Schedule an appointment as soon as possible for a visit in 1 week(s).   Specialty: Family Medicine Contact information: [address] [place] [address] [phone]              Allergies  Allergen Reactions  . Clindamycin/Lincomycin     diarrhea  . Amoxicillin Diarrhea    Consultations:  Orthopedic  Procedures/Studies: DG Chest 2 View  Result Date: 05/26/2020 CLINICAL DATA:  Shortness of breath. EXAM: CHEST - 2 VIEW COMPARISON:  CT chest/abdomen/pelvis 04/24/2020. Chest radiographs 04/09/2020. FINDINGS: Heart size at the upper limits of normal. Aortic atherosclerosis. New from the prior examination of 04/24/2020, there is a small to moderate left pleural effusion with associated left basilar atelectasis and/or consolidation.  The right lung is clear. No evidence of pneumothorax. No acute bony abnormality identified. IMPRESSION: Small to moderate left pleural effusion with associated left basilar atelectasis and/or consolidation, new as compared to the examination of 04/24/2020. Aortic Atherosclerosis (ICD10-I70.0). Electronically Signed   By: Kellie Simmering DO   On: 05/26/2020 06:33   CT Head Wo Contrast  Result Date: 05/19/2020 CLINICAL DATA:  Fall trauma. Patient tripped in the parking lot. Hit head and has laceration above by bra. EXAM: CT HEAD WITHOUT CONTRAST CT CERVICAL SPINE WITHOUT CONTRAST TECHNIQUE: Multidetector CT imaging of the head and cervical spine was performed following the standard protocol without intravenous contrast. Multiplanar CT image reconstructions of the cervical spine were also generated. COMPARISON:  None. FINDINGS: CT HEAD FINDINGS Brain: There is central and cortical atrophy. Periventricular white matter changes are consistent with small vessel disease. There is no intra or extra-axial fluid collection or  mass lesion. The basilar cisterns and ventricles have a normal appearance. There is no CT evidence for acute infarction or hemorrhage. Vascular: . No hyperdense vessels. There is atherosclerotic calcification of the internal carotid arteries Skull: Normal. Negative for fracture or focal lesion. Sinuses/Orbits: No acute finding. Other: There is LEFT frontal scalp edema in the supraorbital region. No associated underlying fracture. CT CERVICAL SPINE FINDINGS Alignment: Normal alignment. Skull base and vertebrae: No acute fracture. No primary bone lesion or focal pathologic process. Soft tissues and spinal canal: No prevertebral fluid or swelling. No visible canal hematoma. Disc levels: Moderate disc height loss notably at C3-4, C4-5, C5-6, and C6-7. Upper chest: Negative. Other: None IMPRESSION: 1. No evidence for acute intracranial abnormality. 2. Atrophy and small vessel disease. 3. LEFT frontal scalp edema in the supraorbital region. 4. No evidence for acute cervical spine abnormality. 5. Moderate mid cervical degenerative changes. Electronically Signed   By: Nolon Nations M.D.   On: 05/19/2020 18:53   CT Cervical Spine Wo Contrast  Result Date: 05/19/2020 CLINICAL DATA:  Fall trauma. Patient tripped in the parking lot. Hit head and has laceration above by bra. EXAM: CT HEAD WITHOUT CONTRAST CT CERVICAL SPINE WITHOUT CONTRAST TECHNIQUE: Multidetector CT imaging of the head and cervical spine was performed following the standard protocol without intravenous contrast. Multiplanar CT image reconstructions of the cervical spine were also generated. COMPARISON:  None. FINDINGS: CT HEAD FINDINGS Brain: There is central and cortical atrophy. Periventricular white matter changes are consistent with small vessel disease. There is no intra or extra-axial fluid collection or mass lesion. The basilar cisterns and ventricles have a normal appearance. There is no CT evidence for acute infarction or hemorrhage. Vascular:  . No hyperdense vessels. There is atherosclerotic calcification of the internal carotid arteries Skull: Normal. Negative for fracture or focal lesion. Sinuses/Orbits: No acute finding. Other: There is LEFT frontal scalp edema in the supraorbital region. No associated underlying fracture. CT CERVICAL SPINE FINDINGS Alignment: Normal alignment. Skull base and vertebrae: No acute fracture. No primary bone lesion or focal pathologic process. Soft tissues and spinal canal: No prevertebral fluid or swelling. No visible canal hematoma. Disc levels: Moderate disc height loss notably at C3-4, C4-5, C5-6, and C6-7. Upper chest: Negative. Other: None IMPRESSION: 1. No evidence for acute intracranial abnormality. 2. Atrophy and small vessel disease. 3. LEFT frontal scalp edema in the supraorbital region. 4. No evidence for acute cervical spine abnormality. 5. Moderate mid cervical degenerative changes. Electronically Signed   By: Nolon Nations M.D.   On: 05/19/2020 18:53   CT FEMUR RIGHT WO  CONTRAST  Result Date: 05/26/2020 CLINICAL DATA:  Right thigh hematoma. EXAM: CT OF THE LOWER RIGHT EXTREMITY WITHOUT CONTRAST TECHNIQUE: Multidetector CT imaging of the right lower extremity was performed according to the standard protocol. COMPARISON:  CT chest, abdomen, and pelvis dated April 24, 2020. FINDINGS: Bones/Joint/Cartilage No acute fracture or dislocation. Subacute, healing fractures of right pubic bone and right inferior pubic ramus with associated sclerosis. Prior right total hip arthroplasty. No evidence of hardware failure or loosening. No joint effusion. Mild degenerative changes of the right knee. Osteopenia Ligaments Ligaments are suboptimally evaluated by CT. Muscles and Tendons Grossly intact. Soft tissue Somewhat ill-defined mixed density superficial fluid collection in the right lateral hip overlying the greater trochanter, measuring approximately 6.5 x 5.3 x 13.9 cm. Extensive circumferential soft tissue  swelling of the right thigh. Partially visualized prominent soft tissue edema in the medial left thigh. No soft tissue mass. IMPRESSION: 1. Superficial 6.5 x 5.3 x 13.9 cm hematoma in the right lateral hip overlying the greater trochanter. 2. No acute osseous abnormality. Subacute, healing fractures of the right pubic bone and right inferior pubic ramus. 3. Prior right total hip arthroplasty without evidence of hardware complication. Electronically Signed   By: Titus Dubin M.D.   On: 05/26/2020 10:39   DG Hand Complete Right  Result Date: 05/24/2020 CLINICAL DATA:  Pain and bruising in the right hand involving the thumb, ring finger and little finger following a fall 4 days ago. EXAM: RIGHT HAND - COMPLETE 3+ VIEW COMPARISON:  None. FINDINGS: Nondisplaced fracture across the proximal aspect of the 1st distal phalanx, without intra-articular involvement, displacement or angulation. Mildly impacted fracture of the proximal aspect of the 5th distal phalanx with mild ventral displacement of the distal fragment. This has intra-articular extension. No additional fractures are seen and no dislocations are demonstrated. Moderate 1st IP joint degenerative changes. Minimal 1st MCP joint degenerative changes. IMPRESSION: Fractures of the 1st and 5th distal phalanges, as described above. These results will be called to the ordering clinician or representative by the Radiologist Assistant, and communication documented in the PACS or [employer]. Electronically Signed   By: Claudie Revering M.D.   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  She lives alone and wants to stay independent.  I agreed to get some home health services which were ordered.  Denies any pain.  Discharge Exam: Vitals:   05/28/20 0411 05/28/20 0747  BP: 110/85 111/69  Pulse: 66 63  Resp: 20   Temp: 97.8 F (36.6 C)   SpO2: 100% (!) 82%   Vitals:   05/27/20 1955 05/28/20 0021 05/28/20 0411  05/28/20 0747  BP: 123/64 112/70 110/85 111/69  Pulse: 67 69 66 63  Resp: 18 16 20    Temp: 97.6 F (36.4 C) 98.4 F (36.9 C) 97.8 F (36.6 C)   TempSrc: Oral Oral Oral   SpO2: 97% 100% 100% (!) 82%  Weight:   58.4 kg   Height:        General: Pt is alert, awake, not in acute distress Cardiovascular: RRR, S1/S2 +, no rubs, no gallops Respiratory: CTA bilaterally, no wheezing, no rhonchi Abdominal: Soft, NT, ND, bowel sounds + Extremities: Right lower extremity edema, no calf tenderness.  The results of significant diagnostics from this hospitalization (including imaging, microbiology, ancillary and laboratory) are listed below for reference.    Microbiology: Recent Results (from the past 240 hour(s))  Respiratory Panel by RT PCR (Flu  A&B, Covid) - Nasopharyngeal Swab     Status: None   Collection Time: 05/26/20  9:47 AM   Specimen: Nasopharyngeal Swab  Result Value Ref Range Status   SARS Coronavirus 2 by RT PCR NEGATIVE NEGATIVE Final    Comment: (NOTE) SARS-CoV-2 target nucleic acids are NOT DETECTED.  The SARS-CoV-2 RNA is generally detectable in upper respiratoy specimens during the acute phase of infection. The lowest concentration of SARS-CoV-2 viral copies this assay can detect is 131 copies/mL. A negative result does not preclude SARS-Cov-2 infection and should not be used as the sole basis for treatment or other patient management decisions. A negative result may occur with  improper specimen collection/handling, submission of specimen other than nasopharyngeal swab, presence of viral mutation(s) within the areas targeted by this assay, and inadequate number of viral copies (<131 copies/mL). A negative result must be combined with clinical observations, patient history, and epidemiological information. The expected result is Negative.  Fact Sheet for Patients:  PinkCheek.be  Fact Sheet for Healthcare Providers:   GravelBags.it  This test is no t yet approved or cleared by the [place] FDA and  has been authorized for detection and/or diagnosis of SARS-CoV-2 by FDA under an Emergency Use Authorization (EUA). This EUA will remain  in effect (meaning this test can be used) for the duration of the COVID-19 declaration under Section 564(b)(1) of the Act, 21 U.S.C. section 360bbb-3(b)(1), unless the authorization is terminated or revoked sooner.     Influenza A by PCR NEGATIVE NEGATIVE Final   Influenza B by PCR NEGATIVE NEGATIVE Final    Comment: (NOTE) The Xpert Xpress SARS-CoV-2/FLU/RSV assay is intended as an aid in  the diagnosis of influenza from Nasopharyngeal swab specimens and  should not be used as a sole basis for treatment. Nasal washings and  aspirates are unacceptable for Xpert Xpress SARS-CoV-2/FLU/RSV  testing.  Fact Sheet for Patients: PinkCheek.be  Fact Sheet for Healthcare Providers: GravelBags.it  This test is not yet approved or cleared by the [place] FDA and  has been authorized for detection and/or diagnosis of SARS-CoV-2 by  FDA under an Emergency Use Authorization (EUA). This EUA will remain  in effect (meaning this test can be used) for the duration of the  Covid-19 declaration under Section 564(b)(1) of the Act, 21  U.S.C. section 360bbb-3(b)(1), unless the authorization is  terminated or revoked. Performed at [facility], [address] [place], [place] [address]      Labs: BNP (last 3 results) Recent Labs    03/20/20 1351 05/26/20 0518  BNP 352.1* 606.3*   Basic Metabolic Panel: Recent Labs  Lab 05/26/20 0518 05/27/20 0506  NA 133* 134*  K 4.8 4.2  CL 100 98  CO2 26 30  GLUCOSE 92 76  BUN 20 19  CREATININE 1.17* 1.14*  CALCIUM 8.3* 8.2*   Liver Function Tests: Recent Labs  Lab 05/26/20 0518  AST 22  ALT 18  ALKPHOS 152*  BILITOT  0.9  PROT 5.0*  ALBUMIN 2.6*   No results for input(s): LIPASE, AMYLASE in the last 168 hours. No results for input(s): AMMONIA in the last 168 hours. CBC: Recent Labs  Lab 05/26/20 0518 05/26/20 1825 05/27/20 0506 05/27/20 1132 05/27/20 1914 05/28/20 0030 05/28/20 0553  WBC 3.2*   < > 2.8* 4.9 4.3 4.0 4.5  NEUTROABS 2.4  --   --   --   --   --   --   HGB 6.8*   < >  9.2* 9.1* 8.4* 8.4* 8.5*  HCT 21.3*   < > 27.6* 28.1* 25.6* 25.3* 26.4*  MCV 92.2   < > 90.2 92.7 91.4 91.3 92.0  PLT 181   < > 150 174 164 144* 155   < > = values in this interval not displayed.   Cardiac Enzymes: No results for input(s): CKTOTAL, CKMB, CKMBINDEX, TROPONINI in the last 168 hours. BNP: Invalid input(s): POCBNP CBG: Recent Labs  Lab 05/27/20 0723 05/27/20 1115 05/27/20 1308 05/28/20 0734  GLUCAP 63* 52* 82 82   D-Dimer No results for input(s): DDIMER in the last 72 hours. Hgb A1c No results for input(s): HGBA1C in the last 72 hours. Lipid Profile No results for input(s): CHOL, HDL, LDLCALC, TRIG, CHOLHDL, LDLDIRECT in the last 72 hours. Thyroid function studies No results for input(s): TSH, T4TOTAL, T3FREE, THYROIDAB in the last 72 hours.  Invalid input(s): FREET3 Anemia work up No results for input(s): VITAMINB12, FOLATE, FERRITIN, TIBC, IRON, RETICCTPCT in the last 72 hours. Urinalysis    Component Value Date/Time   COLORURINE STRAW (A) 01/15/2016 1329   APPEARANCEUR CLEAR (A) 01/15/2016 1329   LABSPEC 1.002 (L) 01/15/2016 1329   PHURINE 7.0 01/15/2016 1329   GLUCOSEU NEGATIVE 01/15/2016 1329   HGBUR NEGATIVE 01/15/2016 1329   BILIRUBINUR Negative 03/04/2020 0931   KETONESUR NEGATIVE 01/15/2016 1329   PROTEINUR Negative 03/04/2020 0931   PROTEINUR NEGATIVE 01/15/2016 1329   UROBILINOGEN 0.2 03/04/2020 0931   NITRITE Negative 03/04/2020 0931   NITRITE NEGATIVE 01/15/2016 1329   LEUKOCYTESUR Negative 03/04/2020 0931   Sepsis Labs Invalid input(s): PROCALCITONIN,  WBC,   LACTICIDVEN Microbiology Recent Results (from the past 240 hour(s))  Respiratory Panel by RT PCR (Flu A&B, Covid) - Nasopharyngeal Swab     Status: None   Collection Time: 05/26/20  9:47 AM   Specimen: Nasopharyngeal Swab  Result Value Ref Range Status   SARS Coronavirus 2 by RT PCR NEGATIVE NEGATIVE Final    Comment: (NOTE) SARS-CoV-2 target nucleic acids are NOT DETECTED.  The SARS-CoV-2 RNA is generally detectable in upper respiratoy specimens during the acute phase of infection. The lowest concentration of SARS-CoV-2 viral copies this assay can detect is 131 copies/mL. A negative result does not preclude SARS-Cov-2 infection and should not be used as the sole basis for treatment or other patient management decisions. A negative result may occur with  improper specimen collection/handling, submission of specimen other than nasopharyngeal swab, presence of viral mutation(s) within the areas targeted by this assay, and inadequate number of viral copies (<131 copies/mL). A negative result must be combined with clinical observations, patient history, and epidemiological information. The expected result is Negative.  Fact Sheet for Patients:  PinkCheek.be  Fact Sheet for Healthcare Providers:  GravelBags.it  This test is no t yet approved or cleared by the [place] FDA and  has been authorized for detection and/or diagnosis of SARS-CoV-2 by FDA under an Emergency Use Authorization (EUA). This EUA will remain  in effect (meaning this test can be used) for the duration of the COVID-19 declaration under Section 564(b)(1) of the Act, 21 U.S.C. section 360bbb-3(b)(1), unless the authorization is terminated or revoked sooner.     Influenza A by PCR NEGATIVE NEGATIVE Final   Influenza B by PCR NEGATIVE NEGATIVE Final    Comment: (NOTE) The Xpert Xpress SARS-CoV-2/FLU/RSV assay is intended as an aid in  the diagnosis of  influenza from Nasopharyngeal swab specimens and  should not be used as a sole basis for  treatment. Nasal washings and  aspirates are unacceptable for Xpert Xpress SARS-CoV-2/FLU/RSV  testing.  Fact Sheet for Patients: PinkCheek.be  Fact Sheet for Healthcare Providers: GravelBags.it  This test is not yet approved or cleared by the [place] FDA and  has been authorized for detection and/or diagnosis of SARS-CoV-2 by  FDA under an Emergency Use Authorization (EUA). This EUA will remain  in effect (meaning this test can be used) for the duration of the  Covid-19 declaration under Section 564(b)(1) of the Act, 21  U.S.C. section 360bbb-3(b)(1), unless the authorization is  terminated or revoked. Performed at [facility], [address] [place], [place] [address]     Time coordinating discharge: Over 30 minutes  SIGNED:  Lorella Nimrod, MD  [facility] 05/28/2020, 8:43 AM  If 7PM-7AM, please contact night-coverage [email or web address]  This record has been created using Systems analyst. Errors have been sought and corrected,but may not always be located. Such creation errors do not reflect on the standard of care.

## 2020-05-27 NOTE — Telephone Encounter (Signed)
Copied from [place] [phone]. Topic: Quick Communication - Appointment Cancellation >> May 27, 2020 10:47 AM Lennox Solders wrote: Patient called to cancel appointment scheduled for Adriana. Patient  HAS NOT rescheduled their appointment. Pt is in the hospital  Route to department's PEC pool.

## 2020-05-27 NOTE — Care Management Obs Status (Signed)
[facility] NOTIFICATION   Patient Details  Name: Mariah [name] MRN: [id number] Date of Birth: 1946/01/27   Medicare Observation Status Notification Given:  Yes    Candie Chroman, LCSW 05/27/2020, 4:07 PM

## 2020-05-27 NOTE — Progress Notes (Signed)
*  PRELIMINARY RESULTS* Echocardiogram 2D Echocardiogram has been performed.  Mariah [name] 05/27/2020, 3:00 PM

## 2020-05-27 NOTE — Telephone Encounter (Signed)
Referral placed.

## 2020-05-27 NOTE — Evaluation (Addendum)
Physical Therapy Evaluation Patient Details Name: Mariah [name] MRN: [id number] DOB: 02-Feb-1946 Today's Date: 05/27/2020   History of Present Illness  Pt is a 74 y/o F admitted on 05/26/20 with c/c of SOB x 2 days. Pt is s/p fall on 05/19/20 with sustained small fx on R hand (distal phalanges of R thumb & small finger). CT reveals small to moderate pleural effusion & extensive R thigh hematoma & subacute healing fx of R pubic bone & R inferior pubic ramus. PMH: HTN, HLD, AAA, DVT on eliquis, arthritis, COPD, R lumbar radiculitis, Vit D deficiency, R THA.  Clinical Impression  MD cleared pt for participation in tx.  Pt agreeable to tx. Pt reports mod I prior to admission, not on supplemental oxygen. Pt on 4L/min during session but unable to accurately get SpO2 reading despite multiple attempts with multiple devices - nurse made aware. Pt does c/o SOB rated 6-8/10 during session & PT educates pt on pursed lip breathing. Pt is able to take steps at EOB to then return supine. Anticipate pt will mobilize well if therapy can appropriately monitor Spo2 during session. At this time, recommending HHPT f/u services. Will continue to follow pt acutely to progress gait as able.    Follow Up Recommendations Home health PT;Supervision/Assistance - 24 hour    Equipment Recommendations   (none pt already has RW & SPC)    Recommendations for Other Services       Precautions / Restrictions Precautions Precautions: Fall Precaution Comments: monitor O2 Restrictions Weight Bearing Restrictions: No      Mobility  Bed Mobility Overal bed mobility: Needs Assistance Bed Mobility: Supine to Sit;Sit to Supine     Supine to sit: Supervision;HOB elevated Sit to supine: Supervision;HOB elevated   General bed mobility comments: uses BUE to assist RLE onto bed    Transfers Overall transfer level: Needs assistance Equipment used: Rolling walker (2 wheeled) Transfers: Sit to/from Stand Sit to Stand: Min  assist            Ambulation/Gait             General Gait Details: side steps to L to [facility] with RW & CGA  Stairs            Wheelchair Mobility    Modified Rankin (Stroke Patients Only)       Balance Overall balance assessment: Needs assistance   Sitting balance-Leahy Scale: Good       Standing balance-Leahy Scale: Poor Standing balance comment: BUE support on RW in standing                             Pertinent Vitals/Pain Pain Assessment: 0-10 Pain Score: 8  Pain Location: R hip Pain Descriptors / Indicators: Sore Pain Intervention(s): Monitored during session;Repositioned    Home Living Family/patient expects to be discharged to:: Private residence Living Arrangements: Alone Available Help at Discharge: Family Type of Home: House Home Access: Stairs to enter Entrance Stairs-Rails: Right;Can reach Software engineer of Steps: 4 Home Layout: One level Home Equipment: Environmental consultant - 2 wheels;Cane - quad;Cane - single point Additional Comments: Mod I with SPC    Prior Function Level of Independence: Independent with assistive device(s)         Comments: 2 falls in past 6 months     Hand Dominance        Extremity/Trunk Assessment   Upper Extremity Assessment Upper Extremity Assessment: Overall WFL for tasks  assessed    Lower Extremity Assessment Lower Extremity Assessment: Generalized weakness    Cervical / Trunk Assessment Cervical / Trunk Assessment:  (rounded shoulders)  Communication   Communication: No difficulties  Cognition Arousal/Alertness: Awake/alert Behavior During Therapy: WFL for tasks assessed/performed Overall Cognitive Status: Within Functional Limits for tasks assessed                                        General Comments      Exercises     Assessment/Plan    PT Assessment Patient needs continued PT services  PT Problem List Decreased  balance;Pain;Cardiopulmonary status limiting activity;Decreased mobility;Decreased activity tolerance;Decreased skin integrity;Decreased safety awareness;Decreased knowledge of use of DME;Decreased strength       PT Treatment Interventions DME instruction;Functional mobility training;Balance training;Patient/family education;Neuromuscular re-education;Therapeutic activities;Gait training;Stair training;Therapeutic exercise;Manual techniques    PT Goals (Current goals can be found in the Care Plan section)  Acute Rehab PT Goals Patient Stated Goal: get better PT Goal Formulation: With patient Time For Goal Achievement: 06/10/20 Potential to Achieve Goals: Good    Frequency Min 2X/week   Barriers to discharge Decreased caregiver support lives alone but neice reports she can assist pt at d/c    Co-evaluation               AM-PAC PT "6 Clicks" Mobility  Outcome Measure Help needed turning from your back to your side while in a flat bed without using bedrails?: None Help needed moving from lying on your back to sitting on the side of a flat bed without using bedrails?: None Help needed moving to and from a bed to a chair (including a wheelchair)?: A Little Help needed standing up from a chair using your arms (e.g., wheelchair or bedside chair)?: A Little Help needed to walk in hospital room?: A Little Help needed climbing 3-5 steps with a railing? : A Lot 6 Click Score: 19    End of Session Equipment Utilized During Treatment: Gait belt;Oxygen Activity Tolerance: Patient tolerated treatment well Patient left: in bed;with call bell/phone within reach;with family/visitor present;with bed alarm set Nurse Communication: Mobility status (oxygen) PT Visit Diagnosis: Unsteadiness on feet (R26.81);Difficulty in walking, not elsewhere classified (R26.2);Pain Pain - Right/Left: Right Pain - part of body: Hip    Time: 1505-1530 PT Time Calculation (min) (ACUTE ONLY): 25  min   Charges:   PT Evaluation $PT Eval Moderate Complexity: 1 Mod PT Treatments $Therapeutic Activity: 8-22 mins        Lavone Nian, PT, DPT 05/27/20, 4:08 PM   [name] 05/27/2020, 4:05 PM

## 2020-05-28 DIAGNOSIS — S7011XA Contusion of right thigh, initial encounter: Secondary | ICD-10-CM | POA: Diagnosis not present

## 2020-05-28 DIAGNOSIS — D649 Anemia, unspecified: Secondary | ICD-10-CM | POA: Diagnosis not present

## 2020-05-28 DIAGNOSIS — J449 Chronic obstructive pulmonary disease, unspecified: Secondary | ICD-10-CM | POA: Diagnosis not present

## 2020-05-28 LAB — CBC
HCT: 25.3 % — ABNORMAL LOW (ref 36.0–46.0)
HCT: 26.4 % — ABNORMAL LOW (ref 36.0–46.0)
Hemoglobin: 8.4 g/dL — ABNORMAL LOW (ref 12.0–15.0)
Hemoglobin: 8.5 g/dL — ABNORMAL LOW (ref 12.0–15.0)
MCH: 29.6 pg (ref 26.0–34.0)
MCH: 30.3 pg (ref 26.0–34.0)
MCHC: 32.2 g/dL (ref 30.0–36.0)
MCHC: 33.2 g/dL (ref 30.0–36.0)
MCV: 91.3 fL (ref 80.0–100.0)
MCV: 92 fL (ref 80.0–100.0)
Platelets: 144 10*3/uL — ABNORMAL LOW (ref 150–400)
Platelets: 155 10*3/uL (ref 150–400)
RBC: 2.77 MIL/uL — ABNORMAL LOW (ref 3.87–5.11)
RBC: 2.87 MIL/uL — ABNORMAL LOW (ref 3.87–5.11)
RDW: 14.8 % (ref 11.5–15.5)
RDW: 14.9 % (ref 11.5–15.5)
WBC: 4 10*3/uL (ref 4.0–10.5)
WBC: 4.5 10*3/uL (ref 4.0–10.5)
nRBC: 0 % (ref 0.0–0.2)
nRBC: 0.5 % — ABNORMAL HIGH (ref 0.0–0.2)

## 2020-05-28 LAB — BASIC METABOLIC PANEL
Anion gap: 8 (ref 5–15)
BUN: 19 mg/dL (ref 8–23)
CO2: 28 mmol/L (ref 22–32)
Calcium: 8.1 mg/dL — ABNORMAL LOW (ref 8.9–10.3)
Chloride: 95 mmol/L — ABNORMAL LOW (ref 98–111)
Creatinine, Ser: 1.13 mg/dL — ABNORMAL HIGH (ref 0.44–1.00)
GFR, Estimated: 51 mL/min — ABNORMAL LOW (ref >60)
Glucose, Bld: 124 mg/dL — ABNORMAL HIGH (ref 70–99)
Potassium: 4.4 mmol/L (ref 3.5–5.1)
Sodium: 131 mmol/L — ABNORMAL LOW (ref 135–145)

## 2020-05-28 LAB — IRON AND TIBC
Iron: 36 ug/dL (ref 28–170)
Saturation Ratios: 12 % (ref 10.4–31.8)
TIBC: 301 ug/dL (ref 250–450)
UIBC: 265 ug/dL

## 2020-05-28 LAB — RETICULOCYTES
Immature Retic Fract: 26.8 % — ABNORMAL HIGH (ref 2.3–15.9)
RBC.: 2.95 MIL/uL — ABNORMAL LOW (ref 3.87–5.11)
Retic Count, Absolute: 104.4 10*3/uL (ref 19.0–186.0)
Retic Ct Pct: 3.5 % — ABNORMAL HIGH (ref 0.4–3.1)

## 2020-05-28 LAB — VITAMIN B12: Vitamin B-12: 182 pg/mL (ref 180–914)

## 2020-05-28 LAB — FERRITIN: Ferritin: 18 ng/mL (ref 11–307)

## 2020-05-28 LAB — ECHOCARDIOGRAM COMPLETE
AR max vel: 1.36 cm2
AV Area VTI: 1.79 cm2
AV Area mean vel: 1.57 cm2
AV Mean grad: 7.5 mmHg
AV Peak grad: 15.1 mmHg
Ao pk vel: 1.95 m/s
Area-P 1/2: 5.13 cm2
Height: 61 in
S' Lateral: 2.55 cm
Weight: 2116.42 oz

## 2020-05-28 LAB — FOLATE: Folate: 4.3 ng/mL — ABNORMAL LOW (ref >5.9)

## 2020-05-28 LAB — GLUCOSE, CAPILLARY: Glucose-Capillary: 82 mg/dL (ref 70–99)

## 2020-05-28 MED ORDER — ACETAMINOPHEN 325 MG PO TABS: 650.0000 mg | ORAL_TABLET | Freq: Four times a day (QID) | ORAL | Status: DC | PRN

## 2020-05-28 MED ORDER — FERROUS SULFATE 325 (65 FE) MG PO TABS: 325.0000 mg | ORAL_TABLET | Freq: Two times a day (BID) | ORAL | Status: DC

## 2020-05-28 MED ORDER — FERROUS SULFATE 325 (65 FE) MG PO TABS
325.0000 mg | ORAL_TABLET | Freq: Two times a day (BID) | ORAL | 3 refills | Status: DC
Start: 2020-05-28 — End: 2020-05-28

## 2020-05-28 MED ORDER — FE FUMARATE-B12-VIT C-FA-IFC PO CAPS: 1.0000 | ORAL_CAPSULE | Freq: Two times a day (BID) | ORAL | 3 refills | Status: DC

## 2020-05-28 MED ORDER — DICLOFENAC SODIUM 1 % EX GEL
2.0000 g | Freq: Four times a day (QID) | CUTANEOUS | Status: DC
  Filled 2020-05-28: qty 100

## 2020-05-28 NOTE — Progress Notes (Signed)
SATURATION QUALIFICATIONS: (This note is used to comply with regulatory documentation for home oxygen)  Patient Saturations on Room Air at Rest = 97%  Patient Saturations on Room Air while Ambulating = 86%  Patient Saturations on 2 Liters of oxygen while Ambulating = 95%  Please briefly explain why patient needs home oxygen: Patient did need 2L of oxygen to maintain saturations while ambulating. Patient also expressed that she was very short of breath while ambulating. Patient did use walker during ambulation.  Lupita Leash

## 2020-05-28 NOTE — Progress Notes (Deleted)
Established patient visit   Patient: Mariah [name]   DOB: March 19, 1946   74 y.o. Female  MRN: [id number] Visit Date: 05/29/2020  Today's healthcare provider: Trinna Post, PA-C   No chief complaint on file.  Subjective    HPI  Follow up Hospitalization  Patient was admitted to [facility] on 05/26/2020 and discharged on 05/28/2020. She was treated for symptomatic anemia and hematoma of right thigh. Treatment for this included: labs, chest x-ray, CT and EKG. Telephone follow up was done on none. She reports {excellent/good/fair:19992} compliance with treatment. She reports this condition is {resolved/improved/worsened:23923}.  ----------------------------------------------------------------------------------------- -    {Show patient history (optional):23778::" "}   Medications: Outpatient Medications Prior to Visit  Medication Sig  . albuterol (VENTOLIN HFA) 108 (90 Base) MCG/ACT inhaler INHALE 1-2 PUFFS INTO THE LUNGS EVERY 6 HOURS AS NEEDED FOR WHEEZING OR SHORTNESS OF BREATH.  [name] apixaban (ELIQUIS) 5 MG TABS tablet Take 1 tablet (5 mg total) by mouth 2 (two) times daily.  [name] aspirin EC 81 MG tablet Take 81 mg by mouth daily. Swallow whole.  . betamethasone dipropionate (DIPROLENE) 0.05 % ointment Apply topically daily.  . calcipotriene (DOVONOX) 0.005 % cream APPLY TO SKIN ONCE A DAY APPLY DAILY ON HANDS, FEET AND WRIST X 2 WEEKS, THEN USE 5 DAYS A WEEK  . Cholecalciferol (VITAMIN D3) 2000 units TABS Take 2,000 Units by mouth daily.  . clobetasol (TEMOVATE) 0.05 % external solution APPLY TO AFFECTED AREA TWICE A DAY  . donepezil (ARICEPT) 5 MG tablet Take 5 mg by mouth at bedtime.   . ferrous [id number] C-folic acid (TRINSICON / FOLTRIN) capsule Take 1 capsule by mouth 2 (two) times daily after a meal.  . fluticasone furoate-vilanterol (BREO ELLIPTA) 100-25 MCG/INH AEPB Inhale 1 puff into the lungs daily.  [name] gabapentin (NEURONTIN) 300 MG capsule TAKE 1 CAPSULE BY  MOUTH TWICE A DAY  . levocetirizine (XYZAL) 5 MG tablet TAKE 1 TABLET BY MOUTH EVERY DAY IN THE EVENING  . metoprolol succinate (TOPROL-XL) 50 MG 24 hr tablet TAKE 1 TABLET (50 MG TOTAL) BY MOUTH DAILY. TAKE WITH OR IMMEDIATELY FOLLOWING A MEAL.  . pantoprazole (PROTONIX) 20 MG tablet TAKE 1 TABLET (20 MG TOTAL) BY MOUTH 2 (TWO) TIMES DAILY BEFORE A MEAL.  [name] rOPINIRole (REQUIP) 1 MG tablet TAKE 1 TABLET (1 MG TOTAL) BY MOUTH AT BEDTIME.  . Secukinumab (COSENTYX SENSOREADY PEN) 150 MG/ML SOAJ Inject into the skin.   [name] simvastatin (ZOCOR) 20 MG tablet TAKE 1 TABLET BY MOUTH EVERY DAY  . traMADol (ULTRAM) 50 MG tablet TAKE 1 TABLET BY MOUTH EVERY 6 HOURS AS NEEDED FOR MODERATE OR SEVERE PAIN   Facility-Administered Medications Prior to Visit  Medication Dose Route Frequency Provider  . acetaminophen (TYLENOL) tablet 650 mg  650 mg Oral Q6H PRN Lorella Nimrod, MD  . albuterol (VENTOLIN HFA) 108 (90 Base) MCG/ACT inhaler 2 puff  2 puff Inhalation Q4H PRN Ivor Costa, MD  . cholecalciferol (VITAMIN D) tablet 2,000 Units  2,000 Units Oral Daily Ivor Costa, MD  . dextromethorphan-guaiFENesin [facility] DM) 30-600 MG per 12 hr tablet 1 tablet  1 tablet Oral BID PRN Ivor Costa, MD  . diclofenac Sodium (VOLTAREN) 1 % topical gel 2 g  2 g Topical QID Sharion Settler, NP  . donepezil (ARICEPT) tablet 5 mg  5 mg Oral QHS Ivor Costa, MD  . fluticasone furoate-vilanterol (BREO ELLIPTA) 100-25 MCG/INH 1 puff  1 puff Inhalation Daily Ivor Costa, MD  .  gabapentin (NEURONTIN) capsule 300 mg  300 mg Oral BID Ivor Costa, MD  . hydrALAZINE (APRESOLINE) injection 5 mg  5 mg Intravenous Q2H PRN Ivor Costa, MD  . loratadine (CLARITIN) tablet 10 mg  10 mg Oral Daily Ivor Costa, MD  . metoprolol succinate (TOPROL-XL) 24 hr tablet 50 mg  50 mg Oral Daily Ivor Costa, MD  . morphine 2 MG/ML injection 0.5 mg  0.5 mg Intravenous Q4H PRN Ivor Costa, MD  . ondansetron (ZOFRAN) injection 4 mg  4 mg Intravenous Q8H PRN Ivor Costa,  MD  . oxyCODONE-acetaminophen (PERCOCET/ROXICET) 5-325 MG per tablet 1 tablet  1 tablet Oral Q4H PRN Ivor Costa, MD  . pantoprazole (PROTONIX) EC tablet 20 mg  20 mg Oral BID AC Ivor Costa, MD  . rOPINIRole (REQUIP) tablet 1 mg  1 mg Oral QHS Ivor Costa, MD  . simvastatin (ZOCOR) tablet 20 mg  20 mg Oral Daily Ivor Costa, MD    Review of Systems  {Heme  Chem  Endocrine  Serology  Results Review (optional):23779::" "}  Objective    There were no vitals taken for this visit. {Show previous vital signs (optional):23777::" "}  Physical Exam  ***  No results found for any visits on 05/29/20.  Assessment & Plan     ***  No follow-ups on file.      {provider attestation***:1}   Paulene Floor  [facility] [phone] (phone) [phone] (fax)  [place]

## 2020-05-28 NOTE — TOC Transition Note (Signed)
Transition of Care [facility]) - CM/SW Discharge Note   Patient Details  Name: Mariah [name] MRN: [id number] Date of Birth: August 27, 1945  [facility]) CM/SW Contact:  Candie Chroman, LCSW Phone Number: 05/28/2020, 1:22 PM   Clinical Narrative:  Patient has orders to discharge home today. [facility] is able to accept patient for PT, OT, RN, aide services. Patient is aware and agreeable. [employer] is not in network with [facility] so ordered oxygen through [facility]. No further concerns. CSW signing off.   Final next level of care: Home w Home Health Services Barriers to Discharge: Barriers Resolved   Patient Goals and CMS Choice   CMS Medicare.gov Compare Post Acute Care list provided to:: Patient (Niece at bedside.) Choice offered to / list presented to : Patient  Discharge Placement                    Patient and family notified of of transfer: 05/28/20  Discharge Plan and Services     Post Acute Care Choice: Durable Medical Equipment, Home Health          DME Arranged: Oxygen DME Agency: [facility] Date DME Agency Contacted: 05/28/20   Representative spoke with at DME Agency: [place]: RN, PT, OT, Nurse's Aide [place] Agency: [facility] [place] Date [place]: 05/28/20   Representative spoke with at [place]: Jana Half  Social Determinants of Health ([place]) Interventions     Readmission Risk Interventions No flowsheet data found.

## 2020-05-28 NOTE — TOC Initial Note (Addendum)
Transition of Care [facility]) - Initial/Assessment Note    Patient Details  Name: Mariah [name] MRN: [id number] Date of Birth: 1946/06/01  [facility]) CM/SW Contact:    Candie Chroman, LCSW Phone Number: 05/28/2020, 10:27 AM  Clinical Narrative:  CSW met with patient. Niece at bedside. CSW introduced role and explained that PT recommendations would be discussed. Patient agreeable to home health PT and RN (new oxygen). No agency preference but she will review CMS scores for agencies that serve her zip code. CSW will call around to see who has availability. If unable to find home health, patient reports she will be unable to get to outpatient PT. CSW encouraged her to call her PCP in 1-2 weeks to see if staffing is any better in the event we are unable to set her up with anyone here. [facility] [place], [facility], [facility], and [facility] unable to accept. Left messages for [facility], [facility], and [facility]. Saturations test complete. Asked MD to enter DME order for home oxygen. No further concerns. CSW encouraged patient and her niece to contact CSW as needed. CSW will continue to follow patient for support and facilitate return home today.               10:52 am: Notified [employer] representative of oxygen need.   Expected Discharge Plan: [place] Services Barriers to Discharge: Other (comment) (Setting up home health and new oxygen.)   Patient Goals and CMS Choice   CMS Medicare.gov Compare Post Acute Care list provided to:: Patient (Niece at bedside.)    Expected Discharge Plan and Services Expected Discharge Plan: [place] Acute Care Choice: Durable Medical Equipment, Home Health Living arrangements for the past 2 months: Single Family Home Expected Discharge Date: 05/27/20                                    Prior Living Arrangements/Services Living arrangements for the past 2 months: Single Family Home Lives with:: Self Patient  language and need for interpreter reviewed:: Yes Do you feel safe going back to the place where you live?: Yes      Need for Family Participation in Patient Care: Yes (Comment) Care giver support system in place?: Yes (comment) Current home services: DME Criminal Activity/Legal Involvement Pertinent to Current Situation/Hospitalization: No - Comment as needed  Activities of Daily Living Home Assistive Devices/Equipment: None ADL Screening (condition at time of admission) Patient's cognitive ability adequate to safely complete daily activities?: Yes Is the patient deaf or have difficulty hearing?: No Does the patient have difficulty seeing, even when wearing glasses/contacts?: No Does the patient have difficulty concentrating, remembering, or making decisions?: No Patient able to express need for assistance with ADLs?: No Does the patient have difficulty dressing or bathing?: Yes Independently performs ADLs?: No Communication: Independent Does the patient have difficulty walking or climbing stairs?: No Weakness of Legs: Both Weakness of Arms/Hands: None  Permission Sought/Granted Permission sought to share information with : Facility Sport and exercise psychologist, Family Supports Permission granted to share information with : Yes, Verbal Permission Granted  Share Information with NAME: Iola Turri  Permission granted to share info w AGENCY: [place] granted to share info w Relationship: Niece  Permission granted to share info w Contact Information: [phone]  Emotional Assessment Appearance:: Appears stated age Attitude/Demeanor/Rapport: Engaged, Gracious Affect (typically observed): Accepting, Appropriate, Calm,  Pleasant Orientation: : Oriented to Self, Oriented to Place, Oriented to  Time, Oriented to Situation Alcohol / Substance Use: Not Applicable Psych Involvement: No (comment)  Admission diagnosis:  Blood loss anemia [D50.0] Anticoagulated  [Z79.01] Symptomatic anemia [D64.9] Hematoma of right thigh, initial encounter [S70.11XA] Patient Active Problem List   Diagnosis Date Noted  . Symptomatic anemia 05/26/2020  . CKD (chronic kidney disease), stage III ([place]) 05/26/2020  . Hematoma of right thigh 05/26/2020  . Finger fracture, right 05/26/2020  . SOB (shortness of breath) 05/26/2020  . Acute deep vein thrombosis (DVT) of tibial vein of left lower extremity ([place]) 04/05/2020  . Unintentional weight loss 03/01/2020  . Fall (on)(from) sidewalk curb, initial encounter 03/01/2020  . Memory loss 03/01/2020  . Allergic rhinitis 01/10/2018  . Cholelithiasis 03/16/2016  . Peripheral arterial disease ([place]) 02/05/2016  . 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 Abdominal aortic aneurysm (AAA) ([place]) 01/13/2016  . 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 Nerve root inflammation 10/25/2015  . Psoriatic arthritis ([place]) 10/07/2015  . 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 Right hip pain 08/12/2015  . Arthralgia of multiple joints 08/12/2015  . Age related osteoporosis 08/12/2015  . Chronic obstructive pulmonary disease ([place]) 06/24/2015  . Senile purpura ([place]) 06/24/2015  . Abnormal serum level of alkaline phosphatase 06/18/2015  . Elevated hemoglobin ([place]) 06/18/2015  . Fatigue 06/18/2015  . Fistula 06/18/2015  . Blood glucose elevated 06/18/2015  . Gastro-esophageal reflux disease without esophagitis 06/18/2015  . Restless leg 06/18/2015  . Avitaminosis D 06/18/2015  . Mechanical and motor problems with internal organs 10/11/2009  . Hypercholesteremia 10/11/2009  . Arthritis, degenerative 09/25/2009  . Essential (primary) hypertension 09/25/2009  . Psoriasis 09/25/2009  . Compulsive tobacco user syndrome 09/25/2009   PCP:  [name], MD Pharmacy:   [id number] [place] [id number]: [phone]: [phone]    Social Determinants of Health  (SDOH) Interventions    Readmission Risk Interventions No flowsheet data found.

## 2020-05-28 NOTE — Progress Notes (Addendum)
Adelita Hone Marinois a 74 y.o.femalewith medical history significant ofDVT on Eliquis, HTN, HLD, COPD ON 2L at home, GERD, AAA, psoriasis, CKD stage III, GERD, tobacco abuse,who presents with SOB.   Pt fell on 10/31 and was found to have finger fracture in right hand by Lanetta Inch.Over the past several days, she has noticed increasing painandbruising to rightthigh. She has subsequently developed a large hematoma that has beenspreading.  Found to have hemoglobin of 6.8, received 1 unit of PRBC with improvement in her hemoglobin.  Echocardiogram was done yesterday which was ordered with some concern of CHF due to her history.  Patient was discharged but never left stating that she wants to have echocardiogram results available before leaving.  Discussed the echocardiogram results as they just show grade 2 diastolic dysfunction, normal EF.  We also checked anemia panel which shows some folic acid deficiency.  She was started on supplement and will follow up with her primary care provider.  On exam her chest was clear, she did had mild right lower extremity edema and it was wrapped in Ace wrap, most likely secondary to right thigh hematoma.  Otherwise appears euvolemic.  Daughter was at bedside.  We walked her and she did desaturate with ambulation.  She was only using oxygen at night before.  Oxygen for 24-hour was ordered.  She will need 2L all the time  Patient is being discharged.

## 2020-05-28 NOTE — Progress Notes (Signed)
Mariah [name]  A and O x 4 VSS. Pt tolerating diet well. No complaints of pain or nausea. IV removed intact, prescriptions given. Pt voices understanding of discharge instructions with no further questions. Pt discharged home via wheelchair with niece.   Allergies as of 05/28/2020      Reactions   Clindamycin/lincomycin    diarrhea   Amoxicillin Diarrhea      Medication List    TAKE these medications   albuterol 108 (90 Base) MCG/ACT inhaler Commonly known as: VENTOLIN HFA INHALE 1-2 PUFFS INTO THE LUNGS EVERY 6 HOURS AS NEEDED FOR WHEEZING OR SHORTNESS OF BREATH.   apixaban 5 MG Tabs tablet Commonly known as: ELIQUIS Take 1 tablet (5 mg total) by mouth 2 (two) times daily.   aspirin EC 81 MG tablet Take 81 mg by mouth daily. Swallow whole.   betamethasone dipropionate 0.05 % ointment Commonly known as: DIPROLENE Apply topically daily.   Breo Ellipta 100-25 MCG/INH Aepb Generic drug: fluticasone furoate-vilanterol Inhale 1 puff into the lungs daily.   calcipotriene 0.005 % cream Commonly known as: DOVONOX APPLY TO SKIN ONCE A DAY APPLY DAILY ON HANDS, FEET AND WRIST X 2 WEEKS, THEN USE 5 DAYS A WEEK What changed: Another medication with the same name was removed. Continue taking this medication, and follow the directions you see here.   clobetasol 0.05 % external solution Commonly known as: TEMOVATE APPLY TO AFFECTED AREA TWICE A DAY   Cosentyx Sensoready Pen 150 MG/ML [place] Generic drug: Secukinumab Inject into the skin.   donepezil 5 MG tablet Commonly known as: ARICEPT Take 5 mg by mouth at bedtime.   ferrous [id number] C-folic acid capsule Commonly known as: TRINSICON / FOLTRIN Take 1 capsule by mouth 2 (two) times daily after a meal.   gabapentin 300 MG capsule Commonly known as: NEURONTIN TAKE 1 CAPSULE BY MOUTH TWICE A DAY   levocetirizine 5 MG tablet Commonly known as: XYZAL TAKE 1 TABLET BY MOUTH EVERY DAY IN THE EVENING   metoprolol  succinate 50 MG 24 hr tablet Commonly known as: TOPROL-XL TAKE 1 TABLET (50 MG TOTAL) BY MOUTH DAILY. TAKE WITH OR IMMEDIATELY FOLLOWING A MEAL.   pantoprazole 20 MG tablet Commonly known as: PROTONIX TAKE 1 TABLET (20 MG TOTAL) BY MOUTH 2 (TWO) TIMES DAILY BEFORE A MEAL.   rOPINIRole 1 MG tablet Commonly known as: REQUIP TAKE 1 TABLET (1 MG TOTAL) BY MOUTH AT BEDTIME.   simvastatin 20 MG tablet Commonly known as: ZOCOR TAKE 1 TABLET BY MOUTH EVERY DAY   traMADol 50 MG tablet Commonly known as: ULTRAM TAKE 1 TABLET BY MOUTH EVERY 6 HOURS AS NEEDED FOR MODERATE OR SEVERE PAIN   Vitamin D3 50 MCG (2000 UT) Tabs Take 2,000 Units by mouth daily.            Durable Medical Equipment  (From admission, onward)         Start     Ordered   05/28/20 1039  For home use only DME oxygen  Once       Question Answer Comment  Length of Need Lifetime   Mode or (Route) Nasal cannula   Liters per Minute 2   Frequency Continuous (stationary and portable oxygen unit needed)   Oxygen conserving device Yes   Oxygen delivery system Gas      05/28/20 1039          Vitals:   05/28/20 0747 05/28/20 1050  BP: 111/69 104/64  Pulse: 63  61  Resp:  16  Temp:  97.7 F (36.5 C)  SpO2: (!) 82% 96%    Mariah [name]

## 2020-05-29 ENCOUNTER — Inpatient Hospital Stay: Payer: PPO | Admitting: Physician Assistant

## 2020-05-30 ENCOUNTER — Ambulatory Visit (INDEPENDENT_AMBULATORY_CARE_PROVIDER_SITE_OTHER): Payer: PPO | Admitting: Adult Health

## 2020-05-30 ENCOUNTER — Other Ambulatory Visit: Payer: Self-pay

## 2020-05-30 ENCOUNTER — Encounter: Payer: Self-pay | Admitting: Adult Health

## 2020-05-30 DIAGNOSIS — S0990XA Unspecified injury of head, initial encounter: Secondary | ICD-10-CM | POA: Insufficient documentation

## 2020-05-30 DIAGNOSIS — I82442 Acute embolism and thrombosis of left tibial vein: Secondary | ICD-10-CM

## 2020-05-30 DIAGNOSIS — S7011XD Contusion of right thigh, subsequent encounter: Secondary | ICD-10-CM | POA: Diagnosis not present

## 2020-05-30 DIAGNOSIS — R9431 Abnormal electrocardiogram [ECG] [EKG]: Secondary | ICD-10-CM | POA: Diagnosis not present

## 2020-05-30 DIAGNOSIS — R5383 Other fatigue: Secondary | ICD-10-CM

## 2020-05-30 DIAGNOSIS — S6291XS Unspecified fracture of right wrist and hand, sequela: Secondary | ICD-10-CM | POA: Diagnosis not present

## 2020-05-30 DIAGNOSIS — S0990XS Unspecified injury of head, sequela: Secondary | ICD-10-CM

## 2020-05-30 DIAGNOSIS — W101XXS Fall (on)(from) sidewalk curb, sequela: Secondary | ICD-10-CM | POA: Diagnosis not present

## 2020-05-30 DIAGNOSIS — R600 Localized edema: Secondary | ICD-10-CM | POA: Diagnosis not present

## 2020-05-30 DIAGNOSIS — R0600 Dyspnea, unspecified: Secondary | ICD-10-CM

## 2020-05-30 DIAGNOSIS — I499 Cardiac arrhythmia, unspecified: Secondary | ICD-10-CM | POA: Diagnosis not present

## 2020-05-30 DIAGNOSIS — M7989 Other specified soft tissue disorders: Secondary | ICD-10-CM | POA: Diagnosis not present

## 2020-05-30 DIAGNOSIS — R0602 Shortness of breath: Secondary | ICD-10-CM | POA: Diagnosis not present

## 2020-05-30 DIAGNOSIS — Z20822 Contact with and (suspected) exposure to covid-19: Secondary | ICD-10-CM | POA: Diagnosis not present

## 2020-05-30 DIAGNOSIS — D649 Anemia, unspecified: Secondary | ICD-10-CM | POA: Diagnosis not present

## 2020-05-30 DIAGNOSIS — Z9289 Personal history of other medical treatment: Secondary | ICD-10-CM | POA: Diagnosis not present

## 2020-05-30 DIAGNOSIS — Z86718 Personal history of other venous thrombosis and embolism: Secondary | ICD-10-CM | POA: Diagnosis not present

## 2020-05-30 DIAGNOSIS — T8092XA Unspecified transfusion reaction, initial encounter: Secondary | ICD-10-CM | POA: Insufficient documentation

## 2020-05-30 DIAGNOSIS — J90 Pleural effusion, not elsewhere classified: Secondary | ICD-10-CM | POA: Diagnosis not present

## 2020-05-30 DIAGNOSIS — J449 Chronic obstructive pulmonary disease, unspecified: Secondary | ICD-10-CM | POA: Diagnosis not present

## 2020-05-30 DIAGNOSIS — S6291XA Unspecified fracture of right wrist and hand, initial encounter for closed fracture: Secondary | ICD-10-CM | POA: Insufficient documentation

## 2020-05-30 NOTE — Progress Notes (Signed)
Established patient visit   Patient: Mariah [name]   DOB: 1945-07-30   74 y.o. Female  MRN: [id number] Visit Date: 05/30/2020  Today's healthcare provider: Marcille Buffy, FNP   Chief Complaint  Patient presents with  . Follow-up   Subjective    HPI  Follow up ER visit  Patient was seen in ER for shortness of breath after fall on 05/26/2020. She was treated for blood loss anemia, hematoma of right thigh. Treatment for this included starting patient on ferrous [id number] c-folic acid. She reports adequate compliance with treatment. She reports this condition is Worse.   Blood clot left leg 03/19/2020,  Eliquis is on hold currently.  Fell on 05/19/2020 she reports she fell in parking lot at [employer] and there was a slight raise in asphalt and she fell.  She did hit her head and had derma bond over left eyebrow.    She called her daughter and told her she was having difficulty breathing and was taken to the 911 and was admitted.  Daughter reports she has been very shaky since being home, hematoma right leg very painful upper thigh, is wrapped up and lower legs have been weeping bilateral. This is new she has not had weeping in legs prior and pain has increased in hematoma of right upper thigh per daughter.   Daughter has been living with her since fall. Plan is to move her in with her daughter and son in law.    She denies any chest pain, or feeling of palpitations, she denies any new onset of shortness of breath and reports it is the same as when she let the hospital.  Blood transfusion was 05/26/2020 hemoglobin was 8.5 HCT 26.4 Denies any syncopal episodes or near syncope.   Discharge instructions 05/26/2020 -05/28/2020 It was pleasure taking care of you. Please continue holding your aspirin and Eliquis for 1 week and then resume it. Please wrap your right leg with Ace wrap and keep it elevated. Please follow-up with your primary care provider within  a week and have your CBC checked. Increase activity slowly    -----------------------------------------------------------------------------------------   Patient Active Problem List   Diagnosis Date Noted  . Irregular heartbeat 05/30/2020  . Head injury 05/30/2020  . Bilateral edema of lower extremity 05/30/2020  . Closed fracture of right hand 05/30/2020  . Blood transfusion reaction 05/30/2020  . Symptomatic anemia 05/26/2020  . CKD (chronic kidney disease), stage III ([place]) 05/26/2020  . Hematoma of right thigh 05/26/2020  . Finger fracture, right 05/26/2020  . Shortness of breath 05/26/2020  . Acute deep vein thrombosis (DVT) of tibial vein of left lower extremity ([place]) 04/05/2020  . Unintentional weight loss 03/01/2020  . Fall (on)(from) sidewalk curb, initial encounter 03/01/2020  . Memory loss 03/01/2020  . Allergic rhinitis 01/10/2018  . Cholelithiasis 03/16/2016  . Peripheral arterial disease ([place]) 02/05/2016  . 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 Abdominal aortic aneurysm (AAA) ([place]) 01/13/2016  . 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 Nerve root inflammation 10/25/2015  . Psoriatic arthritis ([place]) 10/07/2015  . 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 Right hip pain 08/12/2015  . Arthralgia of multiple joints 08/12/2015  . Age related osteoporosis 08/12/2015  . Chronic obstructive pulmonary disease ([place]) 06/24/2015  . Senile purpura ([place]) 06/24/2015  . Abnormal serum level of alkaline phosphatase 06/18/2015  . Elevated hemoglobin ([place]) 06/18/2015  . Lethargy 06/18/2015  . Fistula 06/18/2015  . Blood glucose elevated 06/18/2015  . Gastro-esophageal  reflux disease without esophagitis 06/18/2015  . Restless leg 06/18/2015  . Avitaminosis D 06/18/2015  . Mechanical and motor problems with internal organs 10/11/2009  . Hypercholesteremia 10/11/2009  . Arthritis, degenerative 09/25/2009  . Essential (primary) hypertension 09/25/2009  . Psoriasis 09/25/2009  .  Compulsive tobacco user syndrome 09/25/2009   Past Medical History:  Diagnosis Date  . Abdominal aortic aneurysm (AAA) ([place])   . Arthritis   . COPD (chronic obstructive pulmonary disease) ([place])   . GERD (gastroesophageal reflux disease)   . Headache    sinus  . History of adenomatous polyp of colon   . Hyperlipidemia   . Hypertension   . Psoriasis   . Right lumbar radiculitis   . Vitamin D deficiency    Past Surgical History:  Procedure Laterality Date  . APPENDECTOMY  2000  . BREAST BIOPSY Right 09/2016   radial scar;COMPLEX SCLEROSING LESION  . BREAST LUMPECTOMY Right 11/24/2016   COMPLEX SCLEROSING LESION  . BREAST LUMPECTOMY WITH NEEDLE LOCALIZATION Right 11/24/2016   Procedure: BREAST LUMPECTOMY WITH NEEDLE LOCALIZATION;  Surgeon: Leonie Green, MD;  Location: [facility] ORS;  Service: General;  Laterality: Right;  . COLONOSCOPY WITH PROPOFOL N/A 08/10/2016   Procedure: COLONOSCOPY WITH PROPOFOL;  Surgeon: Manya Silvas, MD;  Location: [facility] ENDOSCOPY;  Service: Endoscopy;  Laterality: N/A;  . ESOPHAGOGASTRODUODENOSCOPY (EGD) WITH PROPOFOL N/A 08/10/2016   Procedure: ESOPHAGOGASTRODUODENOSCOPY (EGD) WITH PROPOFOL;  Surgeon: Manya Silvas, MD;  Location: [facility] ENDOSCOPY;  Service: Endoscopy;  Laterality: N/A;  . FOOT SURGERY Bilateral   . JOINT REPLACEMENT Right 01/27/2016   hip  . 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 TOTAL HIP ARTHROPLASTY Right 01/27/2016   Procedure: TOTAL HIP ARTHROPLASTY;  Surgeon: Dereck Leep, MD;  Location: [facility] ORS;  Service: Orthopedics;  Laterality: Right;   Social History   Tobacco Use  . Smoking status: Current Every Day Smoker    Packs/day: 1.00    Years: 0.00    Pack years: 0.00  . Smokeless tobacco: Never Used  Vaping Use  . Vaping Use: Former  Substance Use Topics  . Alcohol use: Yes    Comment: occasionally glass of wine  . Drug use: No   Social History   Socioeconomic History  . Marital status: Divorced    Spouse name: Not  on file  . Number of children: 0  . Years of education: 18  . Highest education level: Associate degree: occupational, Hotel manager, or vocational program  Occupational History    Comment: retired  Tobacco Use  . Smoking status: Current Every Day Smoker    Packs/day: 1.00    Years: 0.00    Pack years: 0.00  . Smokeless tobacco: Never Used  Vaping Use  . Vaping Use: Former  Substance and Sexual Activity  . Alcohol use: Yes    Comment: occasionally glass of wine  . Drug use: No  . Sexual activity: Not on file  Other Topics Concern  . Not on file  Social History Narrative   Lives self; in [place]; smoke 1ppd; no alcohol. 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    Social Determinants of Health   Financial Resource Strain: Medium Risk  . Difficulty of Paying Living Expenses: Somewhat hard  Food Insecurity: No Food Insecurity  . Worried About Charity fundraiser in the Last Year: Never true  . Ran Out of Food in the Last Year: Never true  Transportation Needs: No Transportation Needs  .  Lack of Transportation (Medical): No  . Lack of Transportation (Non-Medical): No  Physical Activity: Inactive  . Days of Exercise per Week: 0 days  . Minutes of Exercise per Session: 0 min  Stress: No Stress Concern Present  . Feeling of Stress : Not at all  Social Connections: Moderately Integrated  . Frequency of Communication with Friends and Family: More than three times a week  . Frequency of Social Gatherings with Friends and Family: Twice a week  . Attends Religious Services: More than 4 times per year  . Active Member of Clubs or Organizations: Yes  . Attends Archivist Meetings: More than 4 times per year  . Marital Status: Divorced  Human resources officer Violence: Not At Risk  . Fear of Current or Ex-Partner: No  . Emotionally Abused: No  . Physically Abused: No  . Sexually Abused: No   Family Status  Relation Name Status  . Mother  Deceased  . Father   Deceased at age 53  . Sister  Deceased  . Mat Aunt  (Not Specified)  . Ethlyn Daniels  (Not Specified)  . Annamarie Major  (Not Specified)  . PGM  (Not Specified)  . Neg Hx  (Not Specified)   Family History  Problem Relation Age of Onset  . Cancer Mother        kidney  . Arthritis Father   . Heart disease Father   . COPD Sister   . Fibromyalgia Sister   . Osteoarthritis Sister   . Osteoarthritis Maternal Aunt   . Colon cancer Paternal Aunt   . Colon cancer Paternal Uncle   . Colon cancer Paternal Grandmother   . Breast cancer Neg Hx    Allergies  Allergen Reactions  . Clindamycin/Lincomycin     diarrhea  . Amoxicillin Diarrhea       Medications: Outpatient Medications Prior to Visit  Medication Sig  . albuterol (VENTOLIN HFA) 108 (90 Base) MCG/ACT inhaler INHALE 1-2 PUFFS INTO THE LUNGS EVERY 6 HOURS AS NEEDED FOR WHEEZING OR SHORTNESS OF BREATH.  [name] aspirin EC 81 MG tablet Take 81 mg by mouth daily. Swallow whole.  . betamethasone dipropionate (DIPROLENE) 0.05 % ointment Apply topically daily.  . calcipotriene (DOVONOX) 0.005 % cream APPLY TO SKIN ONCE A DAY APPLY DAILY ON HANDS, FEET AND WRIST X 2 WEEKS, THEN USE 5 DAYS A WEEK  . Cholecalciferol (VITAMIN D3) 2000 units TABS Take 2,000 Units by mouth daily.  . clobetasol (TEMOVATE) 0.05 % external solution APPLY TO AFFECTED AREA TWICE A DAY  . ferrous [id number] C-folic acid (TRINSICON / FOLTRIN) capsule Take 1 capsule by mouth 2 (two) times daily after a meal.  . fluticasone furoate-vilanterol (BREO ELLIPTA) 100-25 MCG/INH AEPB Inhale 1 puff into the lungs daily.  [name] gabapentin (NEURONTIN) 300 MG capsule TAKE 1 CAPSULE BY MOUTH TWICE A DAY  . levocetirizine (XYZAL) 5 MG tablet TAKE 1 TABLET BY MOUTH EVERY DAY IN THE EVENING  . metoprolol succinate (TOPROL-XL) 50 MG 24 hr tablet TAKE 1 TABLET (50 MG TOTAL) BY MOUTH DAILY. TAKE WITH OR IMMEDIATELY FOLLOWING A MEAL.  . pantoprazole (PROTONIX) 20 MG tablet TAKE 1 TABLET (20 MG  TOTAL) BY MOUTH 2 (TWO) TIMES DAILY BEFORE A MEAL.  [name] rOPINIRole (REQUIP) 1 MG tablet TAKE 1 TABLET (1 MG TOTAL) BY MOUTH AT BEDTIME.  . Secukinumab (COSENTYX SENSOREADY PEN) 150 MG/ML SOAJ Inject into the skin.   [name] simvastatin (ZOCOR) 20 MG tablet TAKE 1 TABLET BY MOUTH EVERY  DAY  . traMADol (ULTRAM) 50 MG tablet TAKE 1 TABLET BY MOUTH EVERY 6 HOURS AS NEEDED FOR MODERATE OR SEVERE PAIN  . apixaban (ELIQUIS) 5 MG TABS tablet Take 1 tablet (5 mg total) by mouth 2 (two) times daily. (Patient not taking: Reported on 05/30/2020)  . donepezil (ARICEPT) 5 MG tablet Take 5 mg by mouth at bedtime.   . methotrexate (RHEUMATREX) 2.5 MG tablet Take 15 mg by mouth once a week. (Patient not taking: Reported on 05/30/2020)   No facility-administered medications prior to visit.    Review of Systems  Constitutional: Positive for fatigue. Negative for activity change, appetite change, chills, diaphoresis, fever and unexpected weight change.  HENT: Negative.   Respiratory: Positive for shortness of breath (on 2 L of oxygen [place] ). Negative for apnea, cough, choking, chest tightness, wheezing and stridor.   Cardiovascular: Positive for leg swelling. Negative for chest pain and palpitations.  Genitourinary: Negative.   Musculoskeletal: Positive for arthralgias.  Skin: Positive for color change and wound (right upper leg hematoma  ).  Hematological: Negative for adenopathy. Bruises/bleeds easily.  Psychiatric/Behavioral: Negative for agitation and suicidal ideas.    Last CBC Lab Results  Component Value Date   WBC 4.5 05/28/2020   HGB 8.5 (L) 05/28/2020   HCT 26.4 (L) 05/28/2020   MCV 92.0 05/28/2020   MCH 29.6 05/28/2020   RDW 14.9 05/28/2020   PLT 155 37/04/6268   Last metabolic panel Lab Results  Component Value Date   GLUCOSE 124 (H) 05/28/2020   NA 131 (L) 05/28/2020   K 4.4 05/28/2020   CL 95 (L) 05/28/2020   CO2 28 05/28/2020   BUN 19 05/28/2020   CREATININE 1.13 (H) 05/28/2020    GFRNONAA 51 (L) 05/28/2020   GFRAA 45 (L) 04/12/2020   CALCIUM 8.1 (L) 05/28/2020   PROT 5.0 (L) 05/26/2020   ALBUMIN 2.6 (L) 05/26/2020   LABGLOB 2.0 03/20/2020   AGRATIO 1.5 03/20/2020   BILITOT 0.9 05/26/2020   ALKPHOS 152 (H) 05/26/2020   AST 22 05/26/2020   ALT 18 05/26/2020   ANIONGAP 8 05/28/2020   Last lipids Lab Results  Component Value Date   CHOL 163 08/08/2019   HDL 53 08/08/2019   LDLCALC 85 08/08/2019   TRIG 146 08/08/2019   CHOLHDL 3.1 08/08/2019   Last hemoglobin A1c Lab Results  Component Value Date   HGBA1C 5.5 08/08/2019   Last thyroid functions Lab Results  Component Value Date   TSH 4.580 (H) 04/04/2020   Last vitamin D No results found for: 25OHVITD2, 25OHVITD3, VD25OH Last vitamin B12 and Folate Lab Results  Component Value Date   VITAMINB12 182 05/28/2020   FOLATE 4.3 (L) 05/28/2020      Objective    There were no vitals taken for this visit. BP Readings from Last 3 Encounters:  05/28/20 104/64  05/22/20 99/64  05/19/20 (!) 142/71   Wt Readings from Last 3 Encounters:  05/28/20 128 lb 12 oz (58.4 kg)  05/22/20 139 lb 12.8 oz (63.4 kg)  05/19/20 123 lb 7.3 oz (56 kg)  unable to obtain a blood pressure manually or with electronic cuff in either arm. No pulse oximetry reading. Patient to weak to get on table to do EKG. Provider also tried to obtain vitals.   Pulse is very  irregular manually and unable to obtain.      Physical Exam Constitutional:      General: She is not in acute distress.    Appearance: She  is ill-appearing. She is not toxic-appearing or diaphoretic.     Comments: In wheelchair   HENT:     Head: Normocephalic.     Right Ear: External ear normal.     Left Ear: External ear normal.     Nose: Nose normal.     Mouth/Throat:     Mouth: Mucous membranes are moist.  Eyes:     General: No scleral icterus.       Right eye: No discharge.        Left eye: No discharge.     Conjunctiva/sclera: Conjunctivae normal.       Pupils: Pupils are equal, round, and reactive to light.  Cardiovascular:     Rate and Rhythm: Bradycardia present. Rhythm irregular.     Heart sounds: No murmur heard.  No friction rub. No gallop.      Comments: Radial pulse irregular approximate heart rate 48 Pulmonary:     Effort: Pulmonary effort is normal. No respiratory distress.     Breath sounds: No stridor. Wheezing (mild expiratory upper bilateral lobes. ) present. No rhonchi or rales.  Chest:     Chest wall: No tenderness.  Abdominal:     General: Abdomen is flat. Bowel sounds are normal. There is no distension.     Tenderness: There is no abdominal tenderness.  Musculoskeletal:        General: Swelling and tenderness present. No deformity or signs of injury.     Cervical back: Normal range of motion and neck supple.     Right lower leg: Edema present.     Left lower leg: Edema present.  Skin:    General: Skin is warm.     Findings: No erythema, lesion or rash.  Neurological:     General: No focal deficit present.     Mental Status: She is alert and oriented to person, place, and time.     Motor: Weakness present.  Psychiatric:        Mood and Affect: Mood normal.        Thought Content: Thought content normal.        Judgment: Judgment normal.    hematoma wrapped thigh.   No results found for any visits on 05/30/20.  Assessment & Plan     Lethargy  Shortness of breath  Irregular heartbeat  Acute deep vein thrombosis (DVT) of tibial vein of left lower extremity (HCC)  Injury of head, sequela  Fall (on)(from) sidewalk curb, sequela  Closed fracture of right hand, sequela  Hematoma of right thigh, subsequent encounter  Bilateral edema of lower extremity  History of blood transfusion   Patient needs STAT labs, EKG, and further evaluation of lower extremities, history of DVT left lower extremity has now been off Eliquis since hospitalization, and likely atrial fibrillation.  Patient daughter  declined recommended transport  EMS, she will be taking her to [facility], she felt she may need to go before they came to the office as patient hs been excessively sleepy per daughter.  They will go now to the ER.  Return after hospital evaluation today now ., for at any time for any worsening symptoms, Go to Emergency room/ urgent care if worse.         Marcille Buffy, [place] [phone] (phone) [phone] (fax)  [place]

## 2020-05-30 NOTE — Patient Instructions (Signed)
ER now for evaluation and likely admit to hospital given worsening symptoms since discharge.

## 2020-05-31 ENCOUNTER — Telehealth: Payer: Self-pay | Admitting: Family Medicine

## 2020-05-31 ENCOUNTER — Telehealth: Payer: Self-pay

## 2020-05-31 NOTE — Telephone Encounter (Signed)
Copied from [place] [id number]. Topic: General - Other >> May 31, 2020  8:24 AM Leward Quan A wrote: Reason for CRM: Patient niece Mariah [name] called in to schedule a follow up appointment with Dr B. Patient does not want to see other providers. But first available on Dr B schedule is 07/01/20 and she need to be seen next week if possible asking for a call back at Ph# [phone]

## 2020-05-31 NOTE — Telephone Encounter (Signed)
Copied from [place] [id number]. Topic: General - Inquiry >> May 31, 2020  2:44 PM Gillis Ends D wrote: Reason for CRM: Patient called for a refill and she wants something stronger than the tramadol for pain and she doesn't want it to effect her breathing. She also wants something she can take more frequently as fas as the inhalers go. She wants to take the Albuterol more often and maybe the Breo 2 times a day instead of just at night. She wants the prescriptions as soon as possible. She can be reached [phone]. Please advise

## 2020-05-31 NOTE — Telephone Encounter (Signed)
Scheduled for 06/03/2020 at 3pm.

## 2020-05-31 NOTE — Telephone Encounter (Signed)
Patient requesting refill of magic mouthwash that is not on current med list. Please review for refill.

## 2020-05-31 NOTE — Telephone Encounter (Signed)
Medication Refill - Medication: Magic Mouthwash  Has the patient contacted their pharmacy? No. (Agent: If no, request that the patient contact the pharmacy for the refill.) (Agent: If yes, when and what did the pharmacy advise?)  Preferred Pharmacy (with phone number or street name): [id number] - [place], [place]: Please be advised that RX refills may take up to 3 business days. We ask that you follow-up with your pharmacy.

## 2020-06-02 ENCOUNTER — Inpatient Hospital Stay
Admission: EM | Admit: 2020-06-02 | Discharge: 2020-06-07 | DRG: 193 | Disposition: A | Discharge status: Skilled Nursing Facility | Payer: PPO | Attending: Family Medicine | Admitting: Family Medicine

## 2020-06-02 ENCOUNTER — Inpatient Hospital Stay: Payer: PPO

## 2020-06-02 ENCOUNTER — Emergency Department: Payer: PPO

## 2020-06-02 ENCOUNTER — Other Ambulatory Visit: Payer: Self-pay

## 2020-06-02 ENCOUNTER — Encounter: Payer: Self-pay | Admitting: Emergency Medicine

## 2020-06-02 DIAGNOSIS — S62636A Displaced fracture of distal phalanx of right little finger, initial encounter for closed fracture: Secondary | ICD-10-CM | POA: Diagnosis not present

## 2020-06-02 DIAGNOSIS — X58XXXA Exposure to other specified factors, initial encounter: Secondary | ICD-10-CM | POA: Diagnosis present

## 2020-06-02 DIAGNOSIS — F17200 Nicotine dependence, unspecified, uncomplicated: Secondary | ICD-10-CM | POA: Diagnosis not present

## 2020-06-02 DIAGNOSIS — Z6823 Body mass index (BMI) 23.0-23.9, adult: Secondary | ICD-10-CM

## 2020-06-02 DIAGNOSIS — Z825 Family history of asthma and other chronic lower respiratory diseases: Secondary | ICD-10-CM

## 2020-06-02 DIAGNOSIS — R488 Other symbolic dysfunctions: Secondary | ICD-10-CM | POA: Diagnosis not present

## 2020-06-02 DIAGNOSIS — J432 Centrilobular emphysema: Secondary | ICD-10-CM | POA: Diagnosis present

## 2020-06-02 DIAGNOSIS — K219 Gastro-esophageal reflux disease without esophagitis: Secondary | ICD-10-CM | POA: Diagnosis not present

## 2020-06-02 DIAGNOSIS — I714 Abdominal aortic aneurysm, without rupture: Secondary | ICD-10-CM | POA: Diagnosis not present

## 2020-06-02 DIAGNOSIS — L409 Psoriasis, unspecified: Secondary | ICD-10-CM | POA: Diagnosis present

## 2020-06-02 DIAGNOSIS — E876 Hypokalemia: Secondary | ICD-10-CM | POA: Diagnosis not present

## 2020-06-02 DIAGNOSIS — J90 Pleural effusion, not elsewhere classified: Secondary | ICD-10-CM | POA: Diagnosis not present

## 2020-06-02 DIAGNOSIS — I10 Essential (primary) hypertension: Secondary | ICD-10-CM | POA: Diagnosis not present

## 2020-06-02 DIAGNOSIS — N183 Chronic kidney disease, stage 3 unspecified: Secondary | ICD-10-CM | POA: Diagnosis not present

## 2020-06-02 DIAGNOSIS — F0150 Vascular dementia without behavioral disturbance: Secondary | ICD-10-CM | POA: Diagnosis not present

## 2020-06-02 DIAGNOSIS — G2581 Restless legs syndrome: Secondary | ICD-10-CM | POA: Diagnosis not present

## 2020-06-02 DIAGNOSIS — Z20822 Contact with and (suspected) exposure to covid-19: Secondary | ICD-10-CM | POA: Diagnosis present

## 2020-06-02 DIAGNOSIS — R498 Other voice and resonance disorders: Secondary | ICD-10-CM | POA: Diagnosis not present

## 2020-06-02 DIAGNOSIS — S62524A Nondisplaced fracture of distal phalanx of right thumb, initial encounter for closed fracture: Secondary | ICD-10-CM | POA: Diagnosis not present

## 2020-06-02 DIAGNOSIS — Q8909 Congenital malformations of spleen: Secondary | ICD-10-CM

## 2020-06-02 DIAGNOSIS — Z7901 Long term (current) use of anticoagulants: Secondary | ICD-10-CM

## 2020-06-02 DIAGNOSIS — S62666A Nondisplaced fracture of distal phalanx of right little finger, initial encounter for closed fracture: Secondary | ICD-10-CM | POA: Diagnosis not present

## 2020-06-02 DIAGNOSIS — F0390 Unspecified dementia without behavioral disturbance: Secondary | ICD-10-CM | POA: Diagnosis present

## 2020-06-02 DIAGNOSIS — J9621 Acute and chronic respiratory failure with hypoxia: Secondary | ICD-10-CM | POA: Diagnosis present

## 2020-06-02 DIAGNOSIS — M1990 Unspecified osteoarthritis, unspecified site: Secondary | ICD-10-CM

## 2020-06-02 DIAGNOSIS — Z96641 Presence of right artificial hip joint: Secondary | ICD-10-CM | POA: Diagnosis present

## 2020-06-02 DIAGNOSIS — R102 Pelvic and perineal pain: Secondary | ICD-10-CM | POA: Diagnosis not present

## 2020-06-02 DIAGNOSIS — E8809 Other disorders of plasma-protein metabolism, not elsewhere classified: Secondary | ICD-10-CM | POA: Diagnosis not present

## 2020-06-02 DIAGNOSIS — E785 Hyperlipidemia, unspecified: Secondary | ICD-10-CM | POA: Diagnosis present

## 2020-06-02 DIAGNOSIS — Z7982 Long term (current) use of aspirin: Secondary | ICD-10-CM

## 2020-06-02 DIAGNOSIS — Z9889 Other specified postprocedural states: Secondary | ICD-10-CM

## 2020-06-02 DIAGNOSIS — Z881 Allergy status to other antibiotic agents status: Secondary | ICD-10-CM

## 2020-06-02 DIAGNOSIS — E611 Iron deficiency: Secondary | ICD-10-CM | POA: Diagnosis not present

## 2020-06-02 DIAGNOSIS — I130 Hypertensive heart and chronic kidney disease with heart failure and stage 1 through stage 4 chronic kidney disease, or unspecified chronic kidney disease: Secondary | ICD-10-CM | POA: Diagnosis present

## 2020-06-02 DIAGNOSIS — R0602 Shortness of breath: Secondary | ICD-10-CM

## 2020-06-02 DIAGNOSIS — F17210 Nicotine dependence, cigarettes, uncomplicated: Secondary | ICD-10-CM | POA: Diagnosis not present

## 2020-06-02 DIAGNOSIS — D500 Iron deficiency anemia secondary to blood loss (chronic): Secondary | ICD-10-CM | POA: Diagnosis present

## 2020-06-02 DIAGNOSIS — J9601 Acute respiratory failure with hypoxia: Secondary | ICD-10-CM | POA: Diagnosis present

## 2020-06-02 DIAGNOSIS — I5043 Acute on chronic combined systolic (congestive) and diastolic (congestive) heart failure: Secondary | ICD-10-CM | POA: Diagnosis not present

## 2020-06-02 DIAGNOSIS — S62521A Displaced fracture of distal phalanx of right thumb, initial encounter for closed fracture: Secondary | ICD-10-CM | POA: Diagnosis present

## 2020-06-02 DIAGNOSIS — I5033 Acute on chronic diastolic (congestive) heart failure: Secondary | ICD-10-CM | POA: Diagnosis not present

## 2020-06-02 DIAGNOSIS — R627 Adult failure to thrive: Secondary | ICD-10-CM | POA: Diagnosis not present

## 2020-06-02 DIAGNOSIS — Z86718 Personal history of other venous thrombosis and embolism: Secondary | ICD-10-CM

## 2020-06-02 DIAGNOSIS — M6281 Muscle weakness (generalized): Secondary | ICD-10-CM | POA: Diagnosis not present

## 2020-06-02 DIAGNOSIS — R531 Weakness: Secondary | ICD-10-CM

## 2020-06-02 DIAGNOSIS — Z86010 Personal history of colonic polyps: Secondary | ICD-10-CM

## 2020-06-02 DIAGNOSIS — L858 Other specified epidermal thickening: Secondary | ICD-10-CM | POA: Diagnosis not present

## 2020-06-02 DIAGNOSIS — N289 Disorder of kidney and ureter, unspecified: Secondary | ICD-10-CM | POA: Diagnosis present

## 2020-06-02 DIAGNOSIS — J189 Pneumonia, unspecified organism: Secondary | ICD-10-CM | POA: Diagnosis not present

## 2020-06-02 DIAGNOSIS — R296 Repeated falls: Secondary | ICD-10-CM | POA: Diagnosis present

## 2020-06-02 DIAGNOSIS — J918 Pleural effusion in other conditions classified elsewhere: Secondary | ICD-10-CM | POA: Diagnosis present

## 2020-06-02 DIAGNOSIS — N1831 Chronic kidney disease, stage 3a: Secondary | ICD-10-CM | POA: Diagnosis present

## 2020-06-02 DIAGNOSIS — E43 Unspecified severe protein-calorie malnutrition: Secondary | ICD-10-CM | POA: Diagnosis present

## 2020-06-02 DIAGNOSIS — J441 Chronic obstructive pulmonary disease with (acute) exacerbation: Secondary | ICD-10-CM | POA: Diagnosis present

## 2020-06-02 DIAGNOSIS — S7011XA Contusion of right thigh, initial encounter: Secondary | ICD-10-CM | POA: Diagnosis not present

## 2020-06-02 DIAGNOSIS — N1832 Chronic kidney disease, stage 3b: Secondary | ICD-10-CM | POA: Diagnosis not present

## 2020-06-02 DIAGNOSIS — Z79899 Other long term (current) drug therapy: Secondary | ICD-10-CM

## 2020-06-02 DIAGNOSIS — Z9981 Dependence on supplemental oxygen: Secondary | ICD-10-CM

## 2020-06-02 DIAGNOSIS — J449 Chronic obstructive pulmonary disease, unspecified: Secondary | ICD-10-CM

## 2020-06-02 DIAGNOSIS — I5032 Chronic diastolic (congestive) heart failure: Secondary | ICD-10-CM | POA: Diagnosis present

## 2020-06-02 DIAGNOSIS — I82409 Acute embolism and thrombosis of unspecified deep veins of unspecified lower extremity: Secondary | ICD-10-CM | POA: Diagnosis not present

## 2020-06-02 DIAGNOSIS — Z880 Allergy status to penicillin: Secondary | ICD-10-CM

## 2020-06-02 DIAGNOSIS — R0600 Dyspnea, unspecified: Secondary | ICD-10-CM | POA: Diagnosis not present

## 2020-06-02 DIAGNOSIS — Z8249 Family history of ischemic heart disease and other diseases of the circulatory system: Secondary | ICD-10-CM

## 2020-06-02 DIAGNOSIS — E569 Vitamin deficiency, unspecified: Secondary | ICD-10-CM | POA: Diagnosis not present

## 2020-06-02 DIAGNOSIS — Z9049 Acquired absence of other specified parts of digestive tract: Secondary | ICD-10-CM

## 2020-06-02 LAB — CBC
HCT: 22.2 % — ABNORMAL LOW (ref 36.0–46.0)
Hemoglobin: 7.1 g/dL — ABNORMAL LOW (ref 12.0–15.0)
MCH: 29.8 pg (ref 26.0–34.0)
MCHC: 32 g/dL (ref 30.0–36.0)
MCV: 93.3 fL (ref 80.0–100.0)
Platelets: 163 10*3/uL (ref 150–400)
RBC: 2.38 MIL/uL — ABNORMAL LOW (ref 3.87–5.11)
RDW: 15.8 % — ABNORMAL HIGH (ref 11.5–15.5)
WBC: 4.7 10*3/uL (ref 4.0–10.5)
nRBC: 0 % (ref 0.0–0.2)

## 2020-06-02 LAB — CBC WITH DIFFERENTIAL/PLATELET
Abs Immature Granulocytes: 0.01 10*3/uL (ref 0.00–0.07)
Basophils Absolute: 0 10*3/uL (ref 0.0–0.1)
Basophils Relative: 0 %
Eosinophils Absolute: 0.1 10*3/uL (ref 0.0–0.5)
Eosinophils Relative: 3 %
HCT: 21.8 % — ABNORMAL LOW (ref 36.0–46.0)
Hemoglobin: 6.9 g/dL — ABNORMAL LOW (ref 12.0–15.0)
Immature Granulocytes: 0 %
Lymphocytes Relative: 12 %
Lymphs Abs: 0.6 10*3/uL — ABNORMAL LOW (ref 0.7–4.0)
MCH: 29.6 pg (ref 26.0–34.0)
MCHC: 31.7 g/dL (ref 30.0–36.0)
MCV: 93.6 fL (ref 80.0–100.0)
Monocytes Absolute: 0.4 10*3/uL (ref 0.1–1.0)
Monocytes Relative: 8 %
Neutro Abs: 3.7 10*3/uL (ref 1.7–7.7)
Neutrophils Relative %: 77 %
Platelets: 154 10*3/uL (ref 150–400)
RBC: 2.33 MIL/uL — ABNORMAL LOW (ref 3.87–5.11)
RDW: 15.8 % — ABNORMAL HIGH (ref 11.5–15.5)
WBC: 4.8 10*3/uL (ref 4.0–10.5)
nRBC: 0 % (ref 0.0–0.2)

## 2020-06-02 LAB — BASIC METABOLIC PANEL
Anion gap: 7 (ref 5–15)
Anion gap: 8 (ref 5–15)
BUN: 14 mg/dL (ref 8–23)
BUN: 14 mg/dL (ref 8–23)
CO2: 30 mmol/L (ref 22–32)
CO2: 29 mmol/L (ref 22–32)
Calcium: 8.3 mg/dL — ABNORMAL LOW (ref 8.9–10.3)
Calcium: 8 mg/dL — ABNORMAL LOW (ref 8.9–10.3)
Chloride: 99 mmol/L (ref 98–111)
Chloride: 101 mmol/L (ref 98–111)
Creatinine, Ser: 1.01 mg/dL — ABNORMAL HIGH (ref 0.44–1.00)
Creatinine, Ser: 0.79 mg/dL (ref 0.44–1.00)
GFR, Estimated: 58 mL/min — ABNORMAL LOW (ref >60)
GFR, Estimated: >60 mL/min (ref >60)
Glucose, Bld: 84 mg/dL (ref 70–99)
Glucose, Bld: 83 mg/dL (ref 70–99)
Potassium: 4.1 mmol/L (ref 3.5–5.1)
Potassium: 3.9 mmol/L (ref 3.5–5.1)
Sodium: 136 mmol/L (ref 135–145)
Sodium: 138 mmol/L (ref 135–145)

## 2020-06-02 LAB — HEPATIC FUNCTION PANEL
ALT: 20 U/L (ref 0–44)
AST: 24 U/L (ref 15–41)
Albumin: 2.2 g/dL — ABNORMAL LOW (ref 3.5–5.0)
Alkaline Phosphatase: 129 U/L — ABNORMAL HIGH (ref 38–126)
Bilirubin, Direct: 0.2 mg/dL (ref 0.0–0.2)
Indirect Bilirubin: 0.7 mg/dL (ref 0.3–0.9)
Total Bilirubin: 0.9 mg/dL (ref 0.3–1.2)
Total Protein: 4.5 g/dL — ABNORMAL LOW (ref 6.5–8.1)

## 2020-06-02 LAB — TROPONIN I (HIGH SENSITIVITY)
Troponin I (High Sensitivity): 10 ng/L (ref <18)
Troponin I (High Sensitivity): 10 ng/L (ref <18)

## 2020-06-02 LAB — PHOSPHORUS: Phosphorus: 3.7 mg/dL (ref 2.5–4.6)

## 2020-06-02 LAB — RESPIRATORY PANEL BY RT PCR (FLU A&B, COVID)
Influenza A by PCR: NEGATIVE
Influenza B by PCR: NEGATIVE
SARS Coronavirus 2 by RT PCR: NEGATIVE

## 2020-06-02 LAB — BRAIN NATRIURETIC PEPTIDE: B Natriuretic Peptide: 616.8 pg/mL — ABNORMAL HIGH (ref 0.0–100.0)

## 2020-06-02 LAB — LACTIC ACID, PLASMA: Lactic Acid, Venous: 1.1 mmol/L (ref 0.5–1.9)

## 2020-06-02 LAB — PROCALCITONIN: Procalcitonin: <0.1 ng/mL

## 2020-06-02 LAB — MAGNESIUM: Magnesium: 1.8 mg/dL (ref 1.7–2.4)

## 2020-06-02 MED ORDER — IOHEXOL 350 MG/ML SOLN
75.0000 mL | Freq: Once | INTRAVENOUS | Status: AC | PRN
  Administered 2020-06-02: 75 mL via INTRAVENOUS

## 2020-06-02 NOTE — ED Provider Notes (Signed)
[facility] Emergency Department Provider Note    First MD Initiated Contact with Patient 06/02/20 1427     (approximate)  I have reviewed the triage vital signs and the nursing notes.   HISTORY  Chief Complaint Weakness and Shortness of Breath    HPI Mariah [name] is a 74 y.o. female extensive past medical history as listed below presents to the ER for evaluation of generalized malaise persistent weakness and shortness of breath despite recent hospitalization.  Denies missing any doses of her medications.  Denies any recent falls.  Denies any chest pain or pressure.  Does feel like she is needing more oxygen and self titrated up from 2 L to 3.    Past Medical History:  Diagnosis Date  . Abdominal aortic aneurysm (AAA) ([place])   . Arthritis   . COPD (chronic obstructive pulmonary disease) ([place])   . GERD (gastroesophageal reflux disease)   . Headache    sinus  . History of adenomatous polyp of colon   . Hyperlipidemia   . Hypertension   . Psoriasis   . Right lumbar radiculitis   . Vitamin D deficiency    Family History  Problem Relation Age of Onset  . Cancer Mother        kidney  . Arthritis Father   . Heart disease Father   . COPD Sister   . Fibromyalgia Sister   . Osteoarthritis Sister   . Osteoarthritis Maternal Aunt   . Colon cancer Paternal Aunt   . Colon cancer Paternal Uncle   . Colon cancer Paternal Grandmother   . Breast cancer Neg Hx    Past Surgical History:  Procedure Laterality Date  . APPENDECTOMY  2000  . BREAST BIOPSY Right 09/2016   radial scar;COMPLEX SCLEROSING LESION  . BREAST LUMPECTOMY Right 11/24/2016   COMPLEX SCLEROSING LESION  . BREAST LUMPECTOMY WITH NEEDLE LOCALIZATION Right 11/24/2016   Procedure: BREAST LUMPECTOMY WITH NEEDLE LOCALIZATION;  Surgeon: Leonie Green, MD;  Location: [facility] ORS;  Service: General;  Laterality: Right;  . COLONOSCOPY WITH PROPOFOL N/A 08/10/2016   Procedure: COLONOSCOPY WITH  PROPOFOL;  Surgeon: Manya Silvas, MD;  Location: [facility] ENDOSCOPY;  Service: Endoscopy;  Laterality: N/A;  . ESOPHAGOGASTRODUODENOSCOPY (EGD) WITH PROPOFOL N/A 08/10/2016   Procedure: ESOPHAGOGASTRODUODENOSCOPY (EGD) WITH PROPOFOL;  Surgeon: Manya Silvas, MD;  Location: [facility] ENDOSCOPY;  Service: Endoscopy;  Laterality: N/A;  . FOOT SURGERY Bilateral   . JOINT REPLACEMENT Right 01/27/2016   hip  . 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 TOTAL HIP ARTHROPLASTY Right 01/27/2016   Procedure: TOTAL HIP ARTHROPLASTY;  Surgeon: Dereck Leep, MD;  Location: [facility] ORS;  Service: Orthopedics;  Laterality: Right;   Patient Active Problem List   Diagnosis Date Noted  . Irregular heartbeat 05/30/2020  . Head injury 05/30/2020  . Bilateral edema of lower extremity 05/30/2020  . Closed fracture of right hand 05/30/2020  . Blood transfusion reaction 05/30/2020  . Symptomatic anemia 05/26/2020  . CKD (chronic kidney disease), stage III ([place]) 05/26/2020  . Hematoma of right thigh 05/26/2020  . Finger fracture, right 05/26/2020  . Shortness of breath 05/26/2020  . Acute deep vein thrombosis (DVT) of tibial vein of left lower extremity ([place]) 04/05/2020  . Unintentional weight loss 03/01/2020  . Fall (on)(from) sidewalk curb, initial encounter 03/01/2020  . Memory loss 03/01/2020  . Allergic rhinitis 01/10/2018  . Cholelithiasis 03/16/2016  . Peripheral arterial disease ([place]) 02/05/2016  . 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 Abdominal aortic aneurysm (AAA) ([place]) 01/13/2016  . 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 Nerve root inflammation 10/25/2015  . Psoriatic arthritis ([place]) 10/07/2015  . 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 Right hip pain 08/12/2015  . Arthralgia of multiple joints 08/12/2015  . Age related osteoporosis 08/12/2015  . Chronic obstructive pulmonary disease ([place]) 06/24/2015  . Senile purpura ([place]) 06/24/2015  . Abnormal serum level of alkaline phosphatase 06/18/2015  . Elevated  hemoglobin ([place]) 06/18/2015  . Lethargy 06/18/2015  . Fistula 06/18/2015  . Blood glucose elevated 06/18/2015  . Gastro-esophageal reflux disease without esophagitis 06/18/2015  . Restless leg 06/18/2015  . Avitaminosis D 06/18/2015  . Mechanical and motor problems with internal organs 10/11/2009  . Hypercholesteremia 10/11/2009  . Arthritis, degenerative 09/25/2009  . Essential (primary) hypertension 09/25/2009  . Psoriasis 09/25/2009  . Compulsive tobacco user syndrome 09/25/2009      Prior to Admission medications   Medication Sig Start Date End Date Taking? Authorizing Provider  albuterol (VENTOLIN HFA) 108 (90 Base) MCG/ACT inhaler INHALE 1-2 PUFFS INTO THE LUNGS EVERY 6 HOURS AS NEEDED FOR WHEEZING OR SHORTNESS OF BREATH. 05/22/20   Carles Collet M, PA-C  apixaban (ELIQUIS) 5 MG TABS tablet Take 1 tablet (5 mg total) by mouth 2 (two) times daily. Patient not taking: Reported on 05/30/2020 04/23/20   Algernon Huxley, MD  aspirin EC 81 MG tablet Take 81 mg by mouth daily. Swallow whole.    [provider]  betamethasone dipropionate (DIPROLENE) 0.05 % ointment Apply topically daily. 01/01/20   Ralene Bathe, MD  calcipotriene (DOVONOX) 0.005 % cream APPLY TO SKIN ONCE A DAY APPLY DAILY ON HANDS, FEET AND WRIST X 2 WEEKS, THEN USE 5 DAYS A WEEK 01/01/20   Ralene Bathe, MD  Cholecalciferol (VITAMIN D3) 2000 units TABS Take 2,000 Units by mouth daily.    [provider]  clobetasol (TEMOVATE) 0.05 % external solution APPLY TO AFFECTED AREA TWICE A DAY 12/03/19   Bacigalupo, Dionne Bucy, MD  donepezil (ARICEPT) 5 MG tablet Take 5 mg by mouth at bedtime.  04/01/20 05/26/20  [provider]  ferrous [id number] C-folic acid (TRINSICON / FOLTRIN) capsule Take 1 capsule by mouth 2 (two) times daily after a meal. 05/28/20   Lorella Nimrod, MD  fluticasone furoate-vilanterol (BREO ELLIPTA) 100-25 MCG/INH AEPB Inhale 1 puff into the lungs daily. 05/22/20   Trinna Post, PA-C  gabapentin (NEURONTIN) 300 MG capsule TAKE 1 CAPSULE BY MOUTH TWICE A DAY 05/07/20   Bacigalupo, Dionne Bucy, MD  levocetirizine (XYZAL) 5 MG tablet TAKE 1 TABLET BY MOUTH EVERY DAY IN THE EVENING 12/03/19   Bacigalupo, Dionne Bucy, MD  methotrexate (RHEUMATREX) 2.5 MG tablet Take 15 mg by mouth once a week. Patient not taking: Reported on 05/30/2020 05/23/20   [provider]  metoprolol succinate (TOPROL-XL) 50 MG 24 hr tablet TAKE 1 TABLET (50 MG TOTAL) BY MOUTH DAILY. TAKE WITH OR IMMEDIATELY FOLLOWING A MEAL. 03/27/20   Bacigalupo, Dionne Bucy, MD  pantoprazole (PROTONIX) 20 MG tablet TAKE 1 TABLET (20 MG TOTAL) BY MOUTH 2 (TWO) TIMES DAILY BEFORE A MEAL. 12/28/19   Bacigalupo, Dionne Bucy, MD  rOPINIRole (REQUIP) 1 MG tablet TAKE 1 TABLET (1 MG TOTAL) BY MOUTH AT BEDTIME. 12/28/19   Bacigalupo, Dionne Bucy, MD  Secukinumab (COSENTYX SENSOREADY PEN) 150 MG/ML SOAJ Inject into the skin.  04/06/19   [provider]  simvastatin (ZOCOR) 20 MG tablet TAKE 1 TABLET BY  MOUTH EVERY DAY 10/01/19   [name], MD  traMADol (ULTRAM) 50 MG tablet TAKE 1 TABLET BY MOUTH EVERY 6 HOURS AS NEEDED FOR MODERATE OR SEVERE PAIN 01/01/20   Jerrol Banana., MD    Allergies Clindamycin/lincomycin and Amoxicillin    Social History Social History   Tobacco Use  . Smoking status: Current Every Day Smoker    Packs/day: 1.00    Years: 0.00    Pack years: 0.00  . Smokeless tobacco: Never Used  Vaping Use  . Vaping Use: Former  Substance Use Topics  . Alcohol use: Yes    Comment: occasionally glass of wine  . Drug use: No    Review of Systems Patient denies headaches, rhinorrhea, blurry vision, numbness, shortness of breath, chest pain, edema, cough, abdominal pain, nausea, vomiting, diarrhea, dysuria, fevers, rashes or hallucinations unless otherwise stated above in HPI. ____________________________________________   PHYSICAL EXAM:  VITAL SIGNS: Vitals:   06/02/20  1356 06/02/20 1430  BP: (!) 111/59 118/65  Pulse: 73   Resp: 16 20  Temp: 98.9 F (37.2 C)   SpO2: 94%     Constitutional: Alert and oriented. Frail appearing Eyes: Conjunctivae are normal.  Head: Atraumatic. Nose: No congestion/rhinnorhea. Mouth/Throat: Mucous membranes are moist.   Neck: No stridor. Painless ROM.  Cardiovascular: irregularly irregular rhythm. Grossly normal heart sounds.  Good peripheral circulation. Respiratory: Normal respiratory effort.  No retractions. Lungs CTAB. Gastrointestinal: Soft and nontender. No distention. No abdominal bruits. No CVA tenderness. Genitourinary: deferred Musculoskeletal: No lower extremity tenderness nor edema.  No joint effusions. Neurologic:  Normal speech and language. No gross focal neurologic deficits are appreciated. No facial droop Skin:  Skin is warm, dry and intact. No rash noted. Psychiatric: Mood and affect are normal. Speech and behavior are normal.  ____________________________________________   LABS (all labs ordered are listed, but only abnormal results are displayed)  No results found for this or any previous visit (from the past 24 hour(s)). ____________________________________________  EKG My review and personal interpretation at Time: 13:57   Indication: weakness  Rate: 120  Rhythm: sinus with frequent pacs Axis: normal Other: nonspecific st abn ____________________________________________  RADIOLOGY  I personally reviewed all radiographic images ordered to evaluate for the above acute complaints and reviewed radiology reports and findings.  These findings were personally discussed with the patient.  Please see medical record for radiology report.  ____________________________________________   PROCEDURES  Procedure(s) performed:  Procedures    Critical Care performed: no ____________________________________________   INITIAL IMPRESSION / ASSESSMENT AND PLAN / ED COURSE  Pertinent labs &  imaging results that were available during my care of the patient were reviewed by me and considered in my medical decision making (see chart for details).   DDX: Dysrhythmia, anemia, CHF, COPD, sepsis, electrolyte abnormality  Mariah [name] is a 74 y.o. who presents to the ED with presentation as described above.  Nontachycardic with increased O2 requirement states that she has been compliant with her Eliquis.  Denies any history of A. fib not complaining of any chest pain but does feel short of breath.  Feels like she also having worsening swelling in her legs and arm which is no and steadily worsening since she was discharged.  Do feel patient will require blood work.  Chest x-ray appears to have no effusion right base.  Patient be signed out to oncoming physician pending blood work and reassessment.     The patient was evaluated in Emergency Department today for  the symptoms described in the history of present illness. He/she was evaluated in the context of the global COVID-19 pandemic, which necessitated consideration that the patient might be at risk for infection with the SARS-CoV-2 virus that causes COVID-19. Institutional protocols and algorithms that pertain to the evaluation of patients at risk for COVID-19 are in a state of rapid change based on information released by regulatory bodies including the CDC and federal and state organizations. These policies and algorithms were followed during the patient's care in the ED.  As part of my medical decision making, I reviewed the following data within the [place] notes reviewed and incorporated, Labs reviewed, notes from prior ED visits and [place] Controlled Substance Database   ____________________________________________   FINAL CLINICAL IMPRESSION(S) / ED DIAGNOSES  Final diagnoses:  Weakness  Shortness of breath      NEW MEDICATIONS STARTED DURING THIS VISIT:  New Prescriptions   No medications on file      Note:  This document was prepared using Dragon voice recognition software and may include unintentional dictation errors.    Merlyn Lot, MD 06/02/20 [phone]

## 2020-06-02 NOTE — ED Provider Notes (Signed)
Patient has chest x-ray showing new fairly large right-sided effusion with some consolidation of the lung as well.  The procalcitonin is negative.  White count is negative.  Her BNP is elevated at 616 she is anemic at hemoglobin of 6.9 and hematocrit of 21.8.  Her blood pressure has been low.  I am recommending admission for transfusion diuresis evaluation of the effusion and further medical stabilization.  She will have to be transfused slowly and diuresed at the same time as she does have congestive failure in the fluid component of the packed red cells will likely make her congestive failure worse.   Nena Polio, MD 06/02/20 2100

## 2020-06-02 NOTE — H&P (Signed)
[name] OBS:[id number] DOB: Dec 19, 1945 DOA: 06/02/2020     PCP: [name], MD        Patient arrived to ER on 06/02/20 at 1348 Referred by Attending Merlyn Lot, MD   Patient coming from: home  Planing to move in with family    Chief Complaint:   Chief Complaint  Patient presents with  . Weakness  . Shortness of Breath    HPI: Mariah [name] is a 74 y.o. female with medical history significant of DVT,  A.fib, recent fall, COPD, AAA, GERD, HLD, HTN dementia    Presented with   weakness and shortness of breath which has been persistent since discharge but getting worse.  Today she had to increase her oxygen to 3 L  Recently admitted to [facility] for fall with right thigh hematoma was noted to be anemic had to be transfused while in hospital noted to be having 4 L nasal cannula requirement discharged home on 2 L Since her discharge she has been continually short of breath no chest pain no fever no cough or chills. Patient reports generalized itching secondary to psoriasis No melena no black stools She walks on her own but uses her walls to stay stable She has multiple bruising and excoriations on her arms and legs She has a cutaneous horn on her right foot which she wanted to take care of herself but was advised to seek professional help.  1 month ago she was diagnosed with DVT in the left lower extremity and had to be on Eliquis but this had to be discontinued as patient had extensive bruising and hematomas She was seen in the emergency department on 11 November at that time hemoglobin was 8.7 Chest x-ray showed small left pleural effusion with left basilar opacity and trace right pleural effusion Echogram showed no pericardial effusion She was able to be discharged to home   Infectious risk factors:  Reports shortness of breath, dry cough     Has  NOt been vaccinated against COVID ( have delayed but would be willing to get vaccinated)   Initial  COVID TEST  NEGATIVE   Lab Results  Component Value Date   [place] 06/02/2020   [place] NEGATIVE 05/26/2020     Regarding pertinent Chronic problems:      Hyperlipidemia - on statins Zocor Lipid Panel     Component Value Date/Time   CHOL 163 08/08/2019 1139   TRIG 146 08/08/2019 1139   HDL 53 08/08/2019 1139   CHOLHDL 3.1 08/08/2019 1139   CHOLHDL 3.7 07/15/2017 1535   LDLCALC 85 08/08/2019 1139   LDLCALC 112 (H) 07/15/2017 1535   LABVLDL 25 08/08/2019 1139     HTN on Toprol   chronic CHF diastolic - last echo 47/65/46 Grade I diastolic dysfunction (impaired relaxation).  COPD - not   followed by pulmonology   on baseline oxygen currently on 2 L at home   Hx of DVT -Per notes she was supposed to restart her Eliquis but did not     Dementia - on Aricept    Chronic anemia - baseline hg Hemoglobin & Hematocrit  Recent Labs    05/28/20 0030 05/28/20 0553 06/02/20 1747  HGB 8.4* 8.5* 6.9*   While in ER: Chest x-ray showing new large right-sided effusion some consolidation But negative procalcitonin Hemoglobin down to 6.9 She has been somewhat hypotensive   Hospitalist was called for admission for CAP, anasarca  The following Work up has been  ordered so far:  Orders Placed This Encounter  Procedures  . Respiratory Panel by RT PCR (Flu A&B, Covid) - Nasopharyngeal Swab  . DG Chest 2 View  . DG Pelvis 1-2 Views  . Urinalysis, Complete w Microscopic  . Basic metabolic panel  . Brain natriuretic peptide  . Procalcitonin - Baseline  . Lactic acid, plasma  . CBC with Differential  . Document Height and Actual Weight  . Consult to hospitalist  ALL PATIENTS BEING ADMITTED/HAVING PROCEDURES NEED COVID-19 SCREENING  . CBG monitoring, ED  . ED EKG  . Type and screen [place]    Following Medications were ordered in ER: Medications - No data to display      Consult Orders  (From admission, onward)         Start      Ordered   06/02/20 2059  Consult to hospitalist  ALL PATIENTS BEING ADMITTED/HAVING PROCEDURES NEED COVID-19 SCREENING  Once       Comments: ALL PATIENTS BEING ADMITTED/HAVING PROCEDURES NEED COVID-19 SCREENING  Provider:  (Not yet assigned)  Question Answer Comment  Place call to: Hospitalist   Reason for Consult Admit   Diagnosis/Clinical Info for Consult: Weak, hypotensive, worsening CHF with pleural effusion, return of anemia requiring transfusion symptomatic      06/02/20 2058           Significant initial  Findings: Abnormal Labs Reviewed  BASIC METABOLIC PANEL - Abnormal; Notable for the following components:      Result Value   Calcium 8.0 (*)    All other components within normal limits  BRAIN NATRIURETIC PEPTIDE - Abnormal; Notable for the following components:   B Natriuretic Peptide 616.8 (*)    All other components within normal limits  CBC WITH DIFFERENTIAL/PLATELET - Abnormal; Notable for the following components:   RBC 2.33 (*)    Hemoglobin 6.9 (*)    HCT 21.8 (*)    RDW 15.8 (*)    Lymphs Abs 0.6 (*)    All other components within normal limits     Otherwise labs showing:  Recent Labs  Lab 05/27/20 0506 05/28/20 0900 06/02/20 1613  NA 134* 131* 138  K 4.2 4.4 3.9  CO2 30 28 29   GLUCOSE 76 124* 83  BUN 19 19 14   CREATININE 1.14* 1.13* 0.79  CALCIUM 8.2* 8.1* 8.0*    Cr  Down  from baseline see below Lab Results  Component Value Date   CREATININE 0.79 06/02/2020   CREATININE 1.13 (H) 05/28/2020   CREATININE 1.14 (H) 05/27/2020    Recent Labs  Lab 06/02/20 1747  AST 24  ALT 20  ALKPHOS 129*  BILITOT 0.9  PROT 4.5*  ALBUMIN 2.2*   Lab Results  Component Value Date   CALCIUM 8.0 (L) 06/02/2020     WBC      Component Value Date/Time   WBC 4.8 06/02/2020 1747   LYMPHSABS 0.6 (L) 06/02/2020 1747   LYMPHSABS 1.4 03/20/2020 1351   LYMPHSABS 1.0 12/21/2013 1110   MONOABS 0.4 06/02/2020 1747   MONOABS 0.4 12/21/2013 1110   EOSABS  0.1 06/02/2020 1747   EOSABS 0.1 03/20/2020 1351   EOSABS 0.1 12/21/2013 1110   BASOSABS 0.0 06/02/2020 1747   BASOSABS 0.0 03/20/2020 1351   BASOSABS 0.0 12/21/2013 1110   Plt: Lab Results  Component Value Date   PLT 154 06/02/2020   Lactic Acid, Venous    Component Value Date/Time   LATICACIDVEN 1.1 06/02/2020 1747  Procalcitonin <0.10    HG/HCT    Down   from baseline see below    Component Value Date/Time   HGB 6.9 (L) 06/02/2020 1747   HGB 13.2 03/20/2020 1351   HCT 21.8 (L) 06/02/2020 1747   HCT 37.4 03/20/2020 1351   MCV 93.6 06/02/2020 1747   MCV 94 03/20/2020 1351   MCV 92 12/21/2013 1110     Troponin 10 10 Cardiac Panel (last 3 results) No results for input(s): CKTOTAL, CKMB, TROPONINI, RELINDX in the last 72 hours.      ECG: Ordered Personally reviewed by me showing: HR : 120 Rhythm:   Sinus tachycardia    no evidence of ischemic changes QTC 395   BNP (last 3 results) Recent Labs    03/20/20 1351 05/26/20 0518 06/02/20 1616  BNP 352.1* 554.7* 616.8*       not ordered   Urine analysis:    Component Value Date/Time   COLORURINE STRAW (A) 01/15/2016 1329   APPEARANCEUR CLEAR (A) 01/15/2016 1329   LABSPEC 1.002 (L) 01/15/2016 1329   PHURINE 7.0 01/15/2016 1329   GLUCOSEU NEGATIVE 01/15/2016 1329   HGBUR NEGATIVE 01/15/2016 1329   BILIRUBINUR Negative 03/04/2020 0931   KETONESUR NEGATIVE 01/15/2016 1329   PROTEINUR Negative 03/04/2020 0931   PROTEINUR NEGATIVE 01/15/2016 1329   UROBILINOGEN 0.2 03/04/2020 0931   NITRITE Negative 03/04/2020 0931   NITRITE NEGATIVE 01/15/2016 1329   LEUKOCYTESUR Negative 03/04/2020 0931      Ordered   CXR - new plural effusion/infiltrate    CTA chest -    no PE,  evidence of infiltrate vs aspiration Pleural effusion and anasarca      ED Triage Vitals  Enc Vitals Group     BP 06/02/20 1356 (!) 111/59     Pulse Rate 06/02/20 1356 73     Resp 06/02/20 1356 16     Temp 06/02/20 1356 98.9 F  (37.2 C)     Temp Source 06/02/20 1356 Oral     SpO2 06/02/20 1356 94 %     Weight 06/02/20 1358 128 lb 12 oz (58.4 kg)     Height 06/02/20 1358 5\' 1"  (1.549 m)     Head Circumference --      Peak Flow --      Pain Score 06/02/20 1357 8     Pain Loc --      Pain Edu? --      Excl. in [place]? --   TMAX(24)@       Latest  Blood pressure (!) 101/52, pulse 95, temperature 98.1 F (36.7 C), temperature source Oral, resp. rate (!) 26, height 5\' 1"  (1.549 m), weight 58.4 kg, SpO2 100 %.    Review of Systems:    Pertinent positives include:   Fatigue  shortness of breath at rest.   dyspnea on exertion  Constitutional:  No weight loss, night sweats, Fevers, chills, weight loss  HEENT:  No headaches, Difficulty swallowing,Tooth/dental problems,Sore throat,  No sneezing, itching, ear ache, nasal congestion, post nasal drip,  Cardio-vascular:  No chest pain, Orthopnea, PND, anasarca, dizziness, palpitations.no Bilateral lower extremity swelling  GI:  No heartburn, indigestion, abdominal pain, nausea, vomiting, diarrhea, change in bowel habits, loss of appetite, melena, blood in stool, hematemesis Resp:  no, No excess mucus, no productive cough, No non-productive cough, No coughing up of blood.No change in color of mucus.No wheezing. Skin:  no rash or lesions. No jaundice GU:  no dysuria, change in color of urine, no  urgency or frequency. No straining to urinate.  No flank pain.  Musculoskeletal:  No joint pain or no joint swelling. No decreased range of motion. No back pain.  Psych:  No change in mood or affect. No depression or anxiety. No memory loss.  Neuro: no localizing neurological complaints, no tingling, no weakness, no double vision, no gait abnormality, no slurred speech, no confusion  All systems reviewed and apart from [place] all are negative  Past Medical History:   Past Medical History:  Diagnosis Date  . Abdominal aortic aneurysm (AAA) ([place])   . Arthritis   . COPD  (chronic obstructive pulmonary disease) ([place])   . GERD (gastroesophageal reflux disease)   . Headache    sinus  . History of adenomatous polyp of colon   . Hyperlipidemia   . Hypertension   . Psoriasis   . Right lumbar radiculitis   . Vitamin D deficiency      Past Surgical History:  Procedure Laterality Date  . APPENDECTOMY  2000  . BREAST BIOPSY Right 09/2016   radial scar;COMPLEX SCLEROSING LESION  . BREAST LUMPECTOMY Right 11/24/2016   COMPLEX SCLEROSING LESION  . BREAST LUMPECTOMY WITH NEEDLE LOCALIZATION Right 11/24/2016   Procedure: BREAST LUMPECTOMY WITH NEEDLE LOCALIZATION;  Surgeon: Leonie Green, MD;  Location: [facility] ORS;  Service: General;  Laterality: Right;  . COLONOSCOPY WITH PROPOFOL N/A 08/10/2016   Procedure: COLONOSCOPY WITH PROPOFOL;  Surgeon: Manya Silvas, MD;  Location[facility]COPY;  Service: Endoscopy;  Laterality: N/A;  . ESOPHAGOGASTRODUODENOSCOPY (EGD) WITH PROPOFOL N/A 08/10/2016   Procedure: ESOPHAGOGASTRODUODENOSCOPY (EGD) WITH PROPOFOL;  Surgeon: Manya Silvas, MD;  Location: [facility] ENDOSCOPY;  Service: Endoscopy;  Laterality: N/A;  . FOOT SURGERY Bilateral   . JOINT REPLACEMENT Right 01/27/2016   hip  . 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 TOTAL HIP ARTHROPLASTY Right 01/27/2016   Procedure: TOTAL HIP ARTHROPLASTY;  Surgeon: Dereck Leep, MD;  Location: [facility] ORS;  Service: Orthopedics;  Laterality: Right;    Social History:  Ambulatory   Independently      reports that she has been smoking. She has been smoking about 1.00 pack per day for the past 0.00 years. She has never used smokeless tobacco. She reports current alcohol use. She reports that she does not use drugs.   Family History:   Family History  Problem Relation Age of Onset  . Cancer Mother        kidney  . Arthritis Father   . Heart disease Father   . COPD Sister   . Fibromyalgia Sister   . Osteoarthritis Sister   . Osteoarthritis Maternal Aunt   . Colon cancer  Paternal Aunt   . Colon cancer Paternal Uncle   . Colon cancer Paternal Grandmother   . Breast cancer Neg Hx     Allergies: Allergies  Allergen Reactions  . Clindamycin/Lincomycin     diarrhea  . Amoxicillin Diarrhea     Prior to Admission medications   Medication Sig Start Date End Date Taking? Authorizing Provider  albuterol (VENTOLIN HFA) 108 (90 Base) MCG/ACT inhaler INHALE 1-2 PUFFS INTO THE LUNGS EVERY 6 HOURS AS NEEDED FOR WHEEZING OR SHORTNESS OF BREATH. 05/22/20   Carles Collet M, PA-C  apixaban (ELIQUIS) 5 MG TABS tablet Take 1 tablet (5 mg total) by mouth 2 (two) times daily. Patient not taking: Reported on 05/30/2020 04/23/20   Algernon Huxley, MD  aspirin EC 81 MG tablet Take 81 mg by mouth daily.  Swallow whole.    [provider]  betamethasone dipropionate (DIPROLENE) 0.05 % ointment Apply topically daily. 01/01/20   Ralene Bathe, MD  calcipotriene (DOVONOX) 0.005 % cream APPLY TO SKIN ONCE A DAY APPLY DAILY ON HANDS, FEET AND WRIST X 2 WEEKS, THEN USE 5 DAYS A WEEK 01/01/20   Ralene Bathe, MD  Cholecalciferol (VITAMIN D3) 2000 units TABS Take 2,000 Units by mouth daily.    [provider]  clobetasol (TEMOVATE) 0.05 % external solution APPLY TO AFFECTED AREA TWICE A DAY 12/03/19   Bacigalupo, Dionne Bucy, MD  donepezil (ARICEPT) 5 MG tablet Take 5 mg by mouth at bedtime.  04/01/20 05/26/20  [provider]  ferrous [id number] C-folic acid (TRINSICON / FOLTRIN) capsule Take 1 capsule by mouth 2 (two) times daily after a meal. 05/28/20   Lorella Nimrod, MD  fluticasone furoate-vilanterol (BREO ELLIPTA) 100-25 MCG/INH AEPB Inhale 1 puff into the lungs daily. 05/22/20   Trinna Post, PA-C  gabapentin (NEURONTIN) 300 MG capsule TAKE 1 CAPSULE BY MOUTH TWICE A DAY 05/07/20   Bacigalupo, Dionne Bucy, MD  levocetirizine (XYZAL) 5 MG tablet TAKE 1 TABLET BY MOUTH EVERY DAY IN THE EVENING 12/03/19   Bacigalupo, Dionne Bucy, MD  methotrexate  (RHEUMATREX) 2.5 MG tablet Take 15 mg by mouth once a week. Patient not taking: Reported on 05/30/2020 05/23/20   [provider]  metoprolol succinate (TOPROL-XL) 50 MG 24 hr tablet TAKE 1 TABLET (50 MG TOTAL) BY MOUTH DAILY. TAKE WITH OR IMMEDIATELY FOLLOWING A MEAL. 03/27/20   Bacigalupo, Dionne Bucy, MD  pantoprazole (PROTONIX) 20 MG tablet TAKE 1 TABLET (20 MG TOTAL) BY MOUTH 2 (TWO) TIMES DAILY BEFORE A MEAL. 12/28/19   Bacigalupo, Dionne Bucy, MD  rOPINIRole (REQUIP) 1 MG tablet TAKE 1 TABLET (1 MG TOTAL) BY MOUTH AT BEDTIME. 12/28/19   Bacigalupo, Dionne Bucy, MD  Secukinumab (COSENTYX SENSOREADY PEN) 150 MG/ML SOAJ Inject into the skin.  04/06/19   [provider]  simvastatin (ZOCOR) 20 MG tablet TAKE 1 TABLET BY MOUTH EVERY DAY 10/01/19   Bacigalupo, Dionne Bucy, MD  traMADol (ULTRAM) 50 MG tablet TAKE 1 TABLET BY MOUTH EVERY 6 HOURS AS NEEDED FOR MODERATE OR SEVERE PAIN 01/01/20   Jerrol Banana., MD   Physical Exam: Vitals with BMI 06/02/2020 06/02/2020 06/02/2020  Height - - -  Weight - - -  BMI - - -  Systolic 297 88 96  Diastolic 52 78 75  Pulse 95 - 67     1. General:  in No  Acute distress   Chronically ill  -appearing 2. Psychological: Alert and  Oriented 3. Head/ENT:     Dry Mucous Membranes                          Head Non traumatic, neck supple                            Poor Dentition 4. SKIN:   decreased Skin turgor,  Skin clean Dry and intact no rash multiple excoriations with bleeding, cutaneous horn on right foot no evidence of infection 5. Heart: Regular rate and rhythm no  Murmur, no Rub or gallop 6. Lungs:diminished  no wheezes or crackles   7. Abdomen: Soft, non-tender, Non distended  bowel sounds present 8. Lower extremities: no clubbing, cyanosis, 2+ edema 9. Neurologically Grossly intact, moving all 4 extremities equally  10. MSK: Normal range of motion   All other LABS:     Recent Labs  Lab 05/27/20 1132 05/27/20 1914 05/28/20 0030  05/28/20 0553 06/02/20 1747  WBC 4.9 4.3 4.0 4.5 4.8  NEUTROABS  --   --   --   --  3.7  HGB 9.1* 8.4* 8.4* 8.5* 6.9*  HCT 28.1* 25.6* 25.3* 26.4* 21.8*  MCV 92.7 91.4 91.3 92.0 93.6  PLT 174 164 144* 155 154     Recent Labs  Lab 05/27/20 0506 05/28/20 0900 06/02/20 1613  NA 134* 131* 138  K 4.2 4.4 3.9  CL 98 95* 101  CO2 30 28 29   GLUCOSE 76 124* 83  BUN 19 19 14   CREATININE 1.14* 1.13* 0.79  CALCIUM 8.2* 8.1* 8.0*     No results for input(s): AST, ALT, ALKPHOS, BILITOT, PROT, ALBUMIN in the last 168 hours.     Cultures:    Component Value Date/Time   SDES URINE, RANDOM 01/15/2016 1329   SPECREQUEST Normal 01/15/2016 1329   CULT MULTIPLE SPECIES PRESENT, SUGGEST RECOLLECTION (A) 01/15/2016 1329   REPTSTATUS 01/16/2016 FINAL 01/15/2016 1329     Radiological Exams on Admission: DG Chest 2 View  Result Date: 06/02/2020 CLINICAL DATA:  Weakness, shortness of breath EXAM: CHEST - 2 VIEW COMPARISON:  05/26/2020 FINDINGS: Frontal and lateral views of the chest demonstrate a stable cardiac silhouette. Decreased left pleural effusion and left basilar consolidation. New right pleural effusion, obscuring the posterior costophrenic angle and seen along the major fissure. Right lower lobe consolidation is noted. No pneumothorax. No acute bony abnormalities. IMPRESSION: 1. Bibasilar consolidation, favor atelectasis. 2. Bilateral pleural effusions. Electronically Signed   By: Randa Ngo M.D.   On: 06/02/2020 16:49   DG Pelvis 1-2 Views  Result Date: 06/02/2020 CLINICAL DATA:  [place], pain EXAM: PELVIS - 1-2 VIEW COMPARISON:  05/26/2020, 04/24/2020 FINDINGS: Single frontal view of the pelvis demonstrates partial visualization of a right hip arthroplasty. No acute displaced fractures. Stable sclerosis of the right side pubic symphysis. Stable spondylosis at the lumbosacral junction. Diffuse vascular calcifications unchanged. IMPRESSION: 1. Stable appearance of the pelvis.  No acute  fractures. Electronically Signed   By: Randa Ngo M.D.   On: 06/02/2020 20:48    Chart has been reviewed    Assessment/Plan  74 y.o. female with medical history significant of DVT,  A.fib, recent fall, COPD, AAA, GERD, HLD, HTN dementia    Admitted for progressive anemia and possible aspiration pneumonia with pleural effusions  Present on Admission: . Acute respiratory failure with hypoxia (HCC) likely secondary to combination of worsening pleural effusion/possible new aspiration pneumonia Continue oxygen regimen  . CAP (community acquired pneumonia)  - will admit for treatment of CAP will start on appropriate antibiotic coverage.   Obtain:  sputum cultures,                  Obtain respiratory panel                       strep pneumo UA antigen,                            Provide oxygen as needed.  Given possibility of aspiration cover Unasyn also add azithromycin to cover for atypicals Obtain MRsA PCR  . Hypoalbuminemia -check prealbumin order nutritional consult likely contributing to anasarca Patient overall the picture of failure to thrive  . Cutaneous horn -will  need follow-up with podiatry strongly encourage patient to not perform any self procedures as the likely lead to infection  . Compulsive tobacco user syndrome -spoke about importance of quitting patient stating she is working on it order tobacco cessation protocol  . CKD (chronic kidney disease), stage III (HCC) -creatinine currently above baseline May be dilutional repeat and follow avoid nephrotoxic medications  . Chronic obstructive pulmonary disease (HCC) chronic stable continue home medications  . Pleural effusion -we will try to gently diuresis blood pressure allows will administer albumin to produce more effective diuresis May need thoracentesis  . Acute on chronic diastolic CHF (congestive heart failure) (HCC) recent echogram showed diastolic dysfunction, will gently diurese  Significant  excoriations -patient states that this is due to psoriasis but more consistent with generalized severe eczema versus dermatitis We will try to see if applying eczema cream has any improvement  Symptomatic anemia -suspect part of it is dilutional.  Patient with history of transfusion reaction unclear history.  Tolerated prior transfusion during her last admission. Repeat CBC after diuresis and if continues to be significantly anemic will transfuse If no contraindications   Significant debility will need PT OT evaluation prior to discharge may need placement Given severe falls  History of recent DVT -Per note was supposed to restart Eliquis but did not. Given anemia hold off on Eliquis while stabilized Resume if no evidence of progressive bleeding Risk of recurrent DVT versus frequent falls have to be carefully weighted  Obtain anemia panel patient denies any GI blood loss  Other plan as per orders.  DVT prophylaxis:     Lovenox       Code Status:    Code Status: Prior FULL CODE as per patient   I had personally discussed CODE STATUS with patient    Family Communication:   Family not at  Bedside    Disposition Plan:                               To home once workup is complete and patient is stable   Following barriers for discharge:                            Electrolytes corrected                               Anemia corrected                                                        able to transition to PO antibiotics                             Will need to be able to tolerate PO                            Will likely need home health, home O2, set up  Would benefit from PT/OT eval prior to DC  Ordered                   Swallow eval - SLP ordered                                                      Nutrition    consulted                  Wound care  consulted                                       Consults called: none    Admission status:  ED Disposition    ED Disposition Condition [place]: [place] [[id number]]  Level of Care: Progressive Cardiac [[id number]]  Admit to Progressive based on following criteria: CARDIOVASCULAR & THORACIC of moderate stability with acute coronary syndrome symptoms/low risk myocardial infarction/hypertensive urgency/arrhythmias/heart failure potentially compromising stability and stable post cardiovascular intervention patients.  Covid Evaluation: Symptomatic Person Under Investigation (PUI)  Diagnosis: Acute respiratory failure with hypoxia [facility]) [[id number]]  Admitting Physician: Toy Baker [[id number]]  Attending Physician: Toy Baker [[id number]]  Estimated length of stay: past midnight tomorrow  Certification:: I certify this patient will need inpatient services for at least 2 midnights         inpatient     I Expect 2 midnight stay secondary to severity of patient's current illness need for inpatient interventions justified by the following:  hemodynamic instability despite optimal treatment (tachycardia )   Severe lab/radiological/exam abnormalities including:    anemia, hypoalbuminemia and extensive comorbidities including:  CHF   COPD/asthma   CKD  dementia .     [name] Chronic anticoagulation  That are currently affecting medical management.   I expect  patient to be hospitalized for 2 midnights requiring inpatient medical care.  Patient is at high risk for adverse outcome (such as loss of life or disability) if not treated.  Indication for inpatient stay as follows:    Hemodynamic instability despite maximal medical therapy,    New or worsening hypoxia  Need for IV antibiotics, IV albumin    Level of care   Progressive  tele indefinitely please discontinue once patient no longer qualifies COVID-19 Labs   Lab Results  Component Value Date   [place] NEGATIVE 06/02/2020     Precautions: admitted as   Covid Negative    PPE: Used by the provider:   P100  eye Goggles,  Gloves   Crecencio Kwiatek 06/03/2020, 12:44 AM    [facility]     after 2 AM please page floor coverage PA If 7AM-7PM, please contact the day team taking care of the patient using [email or web address]   Patient was evaluated in the context of the global COVID-19 pandemic, which necessitated consideration that the patient might be at risk for infection with the SARS-CoV-2 virus that causes COVID-19. Institutional protocols and algorithms that pertain to the evaluation of patients at risk for COVID-19 are in a state of rapid change based on information released by regulatory bodies including the CDC and federal and state organizations. These policies and algorithms were followed during the patient's care.

## 2020-06-02 NOTE — ED Notes (Signed)
Computer in room not working, blood sent to lab with pt labels at this time.

## 2020-06-02 NOTE — ED Triage Notes (Signed)
Pt to ED via POV for weakness and shortness of breath. Pt states that she was just discharged on 11/9. Pt states that she is not any better. Pt states that she was sent home on 2 liters of oxygen but that she had to increase to 3 liters because she felt like it wasn't working good enough. Pt is in NAD.

## 2020-06-03 ENCOUNTER — Ambulatory Visit: Payer: Self-pay | Admitting: Family Medicine

## 2020-06-03 DIAGNOSIS — J189 Pneumonia, unspecified organism: Secondary | ICD-10-CM

## 2020-06-03 DIAGNOSIS — E8809 Other disorders of plasma-protein metabolism, not elsewhere classified: Secondary | ICD-10-CM | POA: Diagnosis present

## 2020-06-03 DIAGNOSIS — I5033 Acute on chronic diastolic (congestive) heart failure: Secondary | ICD-10-CM | POA: Diagnosis present

## 2020-06-03 DIAGNOSIS — J90 Pleural effusion, not elsewhere classified: Secondary | ICD-10-CM | POA: Diagnosis present

## 2020-06-03 DIAGNOSIS — L858 Other specified epidermal thickening: Secondary | ICD-10-CM | POA: Diagnosis present

## 2020-06-03 DIAGNOSIS — I5032 Chronic diastolic (congestive) heart failure: Secondary | ICD-10-CM | POA: Diagnosis present

## 2020-06-03 HISTORY — DX: Pneumonia, unspecified organism: J18.9

## 2020-06-03 LAB — PHOSPHORUS: Phosphorus: 4.2 mg/dL (ref 2.5–4.6)

## 2020-06-03 LAB — CBC
HCT: 19.9 % — ABNORMAL LOW (ref 36.0–46.0)
Hemoglobin: 6.3 g/dL — ABNORMAL LOW (ref 12.0–15.0)
MCH: 29.9 pg (ref 26.0–34.0)
MCHC: 31.7 g/dL (ref 30.0–36.0)
MCV: 94.3 fL (ref 80.0–100.0)
Platelets: 156 10*3/uL (ref 150–400)
RBC: 2.11 MIL/uL — ABNORMAL LOW (ref 3.87–5.11)
RDW: 15.9 % — ABNORMAL HIGH (ref 11.5–15.5)
WBC: 4.6 10*3/uL (ref 4.0–10.5)
nRBC: 0 % (ref 0.0–0.2)

## 2020-06-03 LAB — PREPARE RBC (CROSSMATCH)

## 2020-06-03 LAB — COMPREHENSIVE METABOLIC PANEL
ALT: 22 U/L (ref 0–44)
AST: 25 U/L (ref 15–41)
Albumin: 2.7 g/dL — ABNORMAL LOW (ref 3.5–5.0)
Alkaline Phosphatase: 152 U/L — ABNORMAL HIGH (ref 38–126)
Anion gap: 16 — ABNORMAL HIGH (ref 5–15)
BUN: 13 mg/dL (ref 8–23)
CO2: 26 mmol/L (ref 22–32)
Calcium: 8.2 mg/dL — ABNORMAL LOW (ref 8.9–10.3)
Chloride: 96 mmol/L — ABNORMAL LOW (ref 98–111)
Creatinine, Ser: 1.02 mg/dL — ABNORMAL HIGH (ref 0.44–1.00)
GFR, Estimated: 57 mL/min — ABNORMAL LOW (ref >60)
Glucose, Bld: 151 mg/dL — ABNORMAL HIGH (ref 70–99)
Potassium: 3.7 mmol/L (ref 3.5–5.1)
Sodium: 138 mmol/L (ref 135–145)
Total Bilirubin: 1.1 mg/dL (ref 0.3–1.2)
Total Protein: 5.4 g/dL — ABNORMAL LOW (ref 6.5–8.1)

## 2020-06-03 LAB — FOLATE: Folate: 5.8 ng/mL — ABNORMAL LOW (ref >5.9)

## 2020-06-03 LAB — IRON AND TIBC
Iron: 32 ug/dL (ref 28–170)
Saturation Ratios: 12 % (ref 10.4–31.8)
TIBC: 263 ug/dL (ref 250–450)
UIBC: 231 ug/dL

## 2020-06-03 LAB — RETICULOCYTES
Immature Retic Fract: 29.4 % — ABNORMAL HIGH (ref 2.3–15.9)
RBC.: 2.16 MIL/uL — ABNORMAL LOW (ref 3.87–5.11)
Retic Count, Absolute: 106.7 10*3/uL (ref 19.0–186.0)
Retic Ct Pct: 4.9 % — ABNORMAL HIGH (ref 0.4–3.1)

## 2020-06-03 LAB — HEMOGLOBIN AND HEMATOCRIT, BLOOD
HCT: 35.9 % — ABNORMAL LOW (ref 36.0–46.0)
Hemoglobin: 11.9 g/dL — ABNORMAL LOW (ref 12.0–15.0)

## 2020-06-03 LAB — PREALBUMIN: Prealbumin: 7 mg/dL — ABNORMAL LOW (ref 18–38)

## 2020-06-03 LAB — FERRITIN: Ferritin: 35 ng/mL (ref 11–307)

## 2020-06-03 LAB — VITAMIN B12: Vitamin B-12: 186 pg/mL (ref 180–914)

## 2020-06-03 LAB — TSH: TSH: 3.167 u[IU]/mL (ref 0.350–4.500)

## 2020-06-03 LAB — MAGNESIUM: Magnesium: 1.9 mg/dL (ref 1.7–2.4)

## 2020-06-03 MED ORDER — TRIAMCINOLONE 0.1 % CREAM:EUCERIN CREAM 1:1: TOPICAL_CREAM | Freq: Two times a day (BID) | CUTANEOUS | Status: DC

## 2020-06-03 MED ORDER — SODIUM CHLORIDE 0.9 % IV SOLN
250.0000 mL | INTRAVENOUS | Status: DC | PRN
  Administered 2020-06-04: 250 mL via INTRAVENOUS

## 2020-06-03 MED ORDER — ACETAMINOPHEN 325 MG PO TABS: 650.0000 mg | ORAL_TABLET | Freq: Four times a day (QID) | ORAL | Status: DC | PRN

## 2020-06-03 MED ORDER — SODIUM CHLORIDE 0.9% IV SOLUTION
Freq: Once | INTRAVENOUS | Status: DC
  Filled 2020-06-03: qty 250

## 2020-06-03 MED ORDER — ROPINIROLE HCL 1 MG PO TABS
1.0000 mg | ORAL_TABLET | Freq: Every day | ORAL | Status: DC
  Administered 2020-06-03 – 2020-06-06 (×5): 1 mg via ORAL
  Filled 2020-06-03 (×5): qty 1

## 2020-06-03 MED ORDER — SODIUM CHLORIDE 0.9 % IV BOLUS: Freq: Once | INTRAVENOUS | Status: AC

## 2020-06-03 MED ORDER — TRIAMCINOLONE ACETONIDE 0.1 % EX CREA
TOPICAL_CREAM | Freq: Two times a day (BID) | CUTANEOUS | Status: DC
  Administered 2020-06-03: 1 via TOPICAL
  Filled 2020-06-03: qty 15

## 2020-06-03 MED ORDER — ALBUMIN HUMAN 25 % IV SOLN
12.5000 g | Freq: Once | INTRAVENOUS | Status: AC
  Administered 2020-06-03: 12.5 g via INTRAVENOUS
  Filled 2020-06-03: qty 50

## 2020-06-03 MED ORDER — GUAIFENESIN ER 600 MG PO TB12
600.0000 mg | ORAL_TABLET | Freq: Two times a day (BID) | ORAL | Status: DC
  Administered 2020-06-03 – 2020-06-07 (×9): 600 mg via ORAL
  Filled 2020-06-03 (×8): qty 1

## 2020-06-03 MED ORDER — SODIUM CHLORIDE 0.9% FLUSH
3.0000 mL | Freq: Two times a day (BID) | INTRAVENOUS | Status: DC
  Administered 2020-06-03 – 2020-06-07 (×9): 3 mL via INTRAVENOUS

## 2020-06-03 MED ORDER — ALBUTEROL SULFATE (2.5 MG/3ML) 0.083% IN NEBU: 2.5000 mg | INHALATION_SOLUTION | RESPIRATORY_TRACT | Status: DC | PRN

## 2020-06-03 MED ORDER — FUROSEMIDE 10 MG/ML IJ SOLN
20.0000 mg | Freq: Once | INTRAMUSCULAR | Status: AC
  Administered 2020-06-03: 20 mg via INTRAVENOUS
  Filled 2020-06-03: qty 4

## 2020-06-03 MED ORDER — HYDROCERIN EX CREA
TOPICAL_CREAM | Freq: Two times a day (BID) | CUTANEOUS | Status: DC
  Filled 2020-06-03: qty 113

## 2020-06-03 MED ORDER — ACETAMINOPHEN 650 MG RE SUPP: 650.0000 mg | Freq: Four times a day (QID) | RECTAL | Status: DC | PRN

## 2020-06-03 MED ORDER — MAGNESIUM OXIDE 400 (241.3 MG) MG PO TABS
400.0000 mg | ORAL_TABLET | Freq: Two times a day (BID) | ORAL | Status: AC
  Administered 2020-06-03 (×2): 400 mg via ORAL
  Filled 2020-06-03 (×2): qty 1

## 2020-06-03 MED ORDER — SODIUM CHLORIDE 0.9 % IV SOLN
3.0000 g | Freq: Once | INTRAVENOUS | Status: AC
  Administered 2020-06-03: 3 g via INTRAVENOUS
  Filled 2020-06-03: qty 8

## 2020-06-03 MED ORDER — FUROSEMIDE 10 MG/ML IJ SOLN
40.0000 mg | Freq: Once | INTRAMUSCULAR | Status: AC
  Administered 2020-06-03: 40 mg via INTRAVENOUS
  Filled 2020-06-03: qty 4

## 2020-06-03 MED ORDER — SODIUM CHLORIDE 0.9 % IV SOLN
500.0000 mg | INTRAVENOUS | Status: DC
  Administered 2020-06-03 – 2020-06-04 (×3): 500 mg via INTRAVENOUS
  Filled 2020-06-03 (×4): qty 500

## 2020-06-03 MED ORDER — SODIUM CHLORIDE 0.9 % IV SOLN
1.0000 g | INTRAVENOUS | Status: DC
  Administered 2020-06-03 – 2020-06-04 (×2): 1 g via INTRAVENOUS
  Filled 2020-06-03: qty 1
  Filled 2020-06-03 (×2): qty 10

## 2020-06-03 MED ORDER — ENOXAPARIN SODIUM 40 MG/0.4ML ~~LOC~~ SOLN
40.0000 mg | SUBCUTANEOUS | Status: DC
  Administered 2020-06-03: 40 mg via SUBCUTANEOUS
  Filled 2020-06-03: qty 0.4

## 2020-06-03 MED ORDER — SODIUM CHLORIDE 0.9% FLUSH: 3.0000 mL | INTRAVENOUS | Status: DC | PRN

## 2020-06-03 MED ORDER — SODIUM CHLORIDE 0.9 % IV SOLN
3.0000 g | Freq: Three times a day (TID) | INTRAVENOUS | Status: DC
  Administered 2020-06-03: 3 g via INTRAVENOUS
  Filled 2020-06-03 (×4): qty 8

## 2020-06-03 MED ORDER — HYDROCODONE-ACETAMINOPHEN 5-325 MG PO TABS
1.0000 | ORAL_TABLET | ORAL | Status: DC | PRN
  Administered 2020-06-03: 2 via ORAL
  Administered 2020-06-03: 1 via ORAL
  Administered 2020-06-04: 2 via ORAL
  Filled 2020-06-03: qty 2
  Filled 2020-06-03: qty 1
  Filled 2020-06-03: qty 2

## 2020-06-03 NOTE — Progress Notes (Signed)
PROGRESS NOTE    Mariah [name]  BZJ:[id number] DOB: [birth year] DOA: 06/02/2020 PCP: [name], MD   Brief Narrative:  Patient is 74 year old female with past medical history of DVT, A. fib-on Eliquis, hypertension, hyperlipidemia, COPD, GERD, AAA, psoriasis, CKD stage III, GERD, tobacco abuse presents to emergency department with generalized weakness and shortness of breath.  ED course: Patient afebrile with no leukocytosis, tachycardic and blood pressure was noted to be on the lower side.  Requiring 2 to 3 L of oxygen via nasal cannula, hemoglobin noted to be 6.8, troponin negative, lactic acid: WNL, magnesium, phosphorus: WNL, BNP: 616, chest x-ray concerning for new large right-sided effusion, some consolidation.  CT angio chest came back negative for acute PE.  Patient admitted for further evaluation and management of symptomatic anemia.   Assessment & Plan:  Acute on chronic hypoxemic respiratory failure: -In the setting of worsening pleural effusion versus pneumonia -Currently requiring 3 L of oxygen via nasal cannula, afebrile, no leukocytosis, lactic acid: WNL.  Reviewed chest x-ray and CT chest. -COVID-19 negative, strep pneumo antigen: Pending. -Patient started on Unasyn and azithromycin-will stop Unasyn and start patient on Rocephin and continue azithromycin. -Consult PCCM-Dr. Raul Del Recommended IR consult for paracentesis & he will evaluate pt tomorrow/  Acute on chronic diastolic CHF: -Patient has worsening bilateral pleural effusion.  BNP: 616. -Reviewed echo from 05/27/2020 which showed ejection fraction of 60 to 65% with grade 1 diastolic dysfunction.  Mild to moderate TR, mild MR -Strict INO's and daily weight.  Monitor electrolytes -Gentle diuresis.  Avoid fluid overload  Symptomatic anemia: -H&H dropped from 7.1-6.3 in 1 day.  Her Eliquis was discontinued on previous admission due to right thigh hematoma. -2 unit PRBC ordered.  Monitor H&H closely. -Check  occult blood.  Continue to hold Eliquis  History of recent DVT: -Patient was started on Eliquis however she was asked to hold for 1 week due to hematoma in the right thigh.  We will hold Eliquis for now considering underlying symptomatic/GFR: Improved from baseline.  Right thigh hematoma: -Improving.  Hypertension: Blood pressure is on lower side.  Hold metoprolol for now  Hyperlipidemia: Continue Zocor  GERD: Continue PPI  CKD stage IIIa: -Renal function improved from baseline.  Continue to monitor.  COPD: Not in acute exacerbation.  No wheezing noted on exam. -Continue home inhalers.  Tobacco abuse: Counseled about cessation  Psoriasis: loratidine as needed  DVT prophylaxis: SCD Code Status: Full code Family Communication:  None present at bedside.  Plan of care discussed with patient in length and she verbalized understanding and agreed with it. Disposition Plan: Pending work-up  Consultants:   PCCM  IR  Procedures:   CT chest  Antimicrobials:  Unasyn Status is: Inpatient  Dispo: The patient is from: Home              Anticipated d/c is to: Home -tenderness to move in with her family              Anticipated d/c date is: More than 2 days              Patient currently not medically stable for the discharge.  Subjective: Patient seen and examined.  Tells me that she feels horrible.  Continues to have shortness of breath and feeling miserable.  She has multiple bruises/scratch marks on her arms and legs which she reported is because of psoriasis.  She has right thigh hematoma which he mentioned is improving.  She denies hematemesis or  melena.  Objective: Vitals:   06/03/20 1100 06/03/20 1200 06/03/20 1300 06/03/20 1415  BP: 115/79 (!) 163/133 118/79 109/66  Pulse: 69 (!) 156 100   Resp: 12 (!) 31 13 15   Temp:   98 F (36.7 C) 98 F (36.7 C)  TempSrc:   Oral Oral  SpO2: 98% 92% 95% 95%  Weight:      Height:        Intake/Output Summary (Last 24 hours)  at 06/03/2020 1456 Last data filed at 06/03/2020 1423 Gross per 24 hour  Intake 1038.56 ml  Output 1850 ml  Net -811.44 ml   Filed Weights   06/02/20 1358  Weight: 58.4 kg    Examination:  General exam: Appears calm and comfortable, on 3 L of oxygen via nasal cannula, communicating well, appears pale Respiratory system: Clear to auscultation. Respiratory effort normal. Cardiovascular system: Tachycardic, systolic murmur noted, RRR. No JVD, murmurs, rubs, gallops or clicks. No pedal edema. Gastrointestinal system: Abdomen is nondistended, soft and nontender. No organomegaly or masses felt. Normal bowel sounds heard. Central nervous system: Alert and oriented. No focal neurological deficits. Extremities: Symmetric 5 x 5 power. Skin: Right thigh large bruise noted.  Multiple bruises and scratch marks noted on both arms and legs. Psychiatry: Judgement and insight appear normal. Mood & affect appropriate.    Data Reviewed: I have personally reviewed following labs and imaging studies  CBC: Recent Labs  Lab 05/28/20 0030 05/28/20 0553 06/02/20 1348 06/02/20 1747 06/03/20 0501  WBC 4.0 4.5 4.7 4.8 4.6  NEUTROABS  --   --   --  3.7  --   HGB 8.4* 8.5* 7.1* 6.9* 6.3*  HCT 25.3* 26.4* 22.2* 21.8* 19.9*  MCV 91.3 92.0 93.3 93.6 94.3  PLT 144* 155 163 154 458   Basic Metabolic Panel: Recent Labs  Lab 05/28/20 0900 06/02/20 1348 06/02/20 1613 06/02/20 1747  NA 131* 136 138  --   K 4.4 4.1 3.9  --   CL 95* 99 101  --   CO2 28 30 29   --   GLUCOSE 124* 84 83  --   BUN 19 14 14   --   CREATININE 1.13* 1.01* 0.79  --   CALCIUM 8.1* 8.3* 8.0*  --   MG  --   --   --  1.8  PHOS  --   --   --  3.7   GFR: Estimated Creatinine Clearance: 49.9 mL/min (by C-G formula based on SCr of 0.79 mg/dL). Liver Function Tests: Recent Labs  Lab 06/02/20 1747  AST 24  ALT 20  ALKPHOS 129*  BILITOT 0.9  PROT 4.5*  ALBUMIN 2.2*   No results for input(s): LIPASE, AMYLASE in the last 168  hours. No results for input(s): AMMONIA in the last 168 hours. Coagulation Profile: No results for input(s): INR, PROTIME in the last 168 hours. Cardiac Enzymes: No results for input(s): CKTOTAL, CKMB, CKMBINDEX, TROPONINI in the last 168 hours. BNP (last 3 results) No results for input(s): PROBNP in the last 8760 hours. HbA1C: No results for input(s): HGBA1C in the last 72 hours. CBG: Recent Labs  Lab 05/28/20 0734  GLUCAP 82   Lipid Profile: No results for input(s): CHOL, HDL, LDLCALC, TRIG, CHOLHDL, LDLDIRECT in the last 72 hours. Thyroid Function Tests: No results for input(s): TSH, T4TOTAL, FREET4, T3FREE, THYROIDAB in the last 72 hours. Anemia Panel: Recent Labs    06/03/20 0501  VITAMINB12 186  FOLATE 5.8*  FERRITIN 35  TIBC 263  IRON 32  RETICCTPCT 4.9*   Sepsis Labs: Recent Labs  Lab 06/02/20 1747  PROCALCITON <0.10  LATICACIDVEN 1.1    Recent Results (from the past 240 hour(s))  Respiratory Panel by RT PCR (Flu A&B, Covid) - Nasopharyngeal Swab     Status: None   Collection Time: 05/26/20  9:47 AM   Specimen: Nasopharyngeal Swab  Result Value Ref Range Status   SARS Coronavirus 2 by RT PCR NEGATIVE NEGATIVE Final    Comment: (NOTE) SARS-CoV-2 target nucleic acids are NOT DETECTED.  The SARS-CoV-2 RNA is generally detectable in upper respiratoy specimens during the acute phase of infection. The lowest concentration of SARS-CoV-2 viral copies this assay can detect is 131 copies/mL. A negative result does not preclude SARS-Cov-2 infection and should not be used as the sole basis for treatment or other patient management decisions. A negative result may occur with  improper specimen collection/handling, submission of specimen other than nasopharyngeal swab, presence of viral mutation(s) within the areas targeted by this assay, and inadequate number of viral copies (<131 copies/mL). A negative result must be combined with clinical observations, patient  history, and epidemiological information. The expected result is Negative.  Fact Sheet for Patients:  PinkCheek.be  Fact Sheet for Healthcare Providers:  GravelBags.it  This test is no t yet approved or cleared by the [place] FDA and  has been authorized for detection and/or diagnosis of SARS-CoV-2 by FDA under an Emergency Use Authorization (EUA). This EUA will remain  in effect (meaning this test can be used) for the duration of the COVID-19 declaration under Section 564(b)(1) of the Act, 21 U.S.C. section 360bbb-3(b)(1), unless the authorization is terminated or revoked sooner.     Influenza A by PCR NEGATIVE NEGATIVE Final   Influenza B by PCR NEGATIVE NEGATIVE Final    Comment: (NOTE) The Xpert Xpress SARS-CoV-2/FLU/RSV assay is intended as an aid in  the diagnosis of influenza from Nasopharyngeal swab specimens and  should not be used as a sole basis for treatment. Nasal washings and  aspirates are unacceptable for Xpert Xpress SARS-CoV-2/FLU/RSV  testing.  Fact Sheet for Patients: PinkCheek.be  Fact Sheet for Healthcare Providers: GravelBags.it  This test is not yet approved or cleared by the [place] FDA and  has been authorized for detection and/or diagnosis of SARS-CoV-2 by  FDA under an Emergency Use Authorization (EUA). This EUA will remain  in effect (meaning this test can be used) for the duration of the  Covid-19 declaration under Section 564(b)(1) of the Act, 21  U.S.C. section 360bbb-3(b)(1), unless the authorization is  terminated or revoked. Performed at [facility], [address] [place], [place] [address]   Respiratory Panel by RT PCR (Flu A&B, Covid) - Nasopharyngeal Swab     Status: None   Collection Time: 06/02/20 10:01 PM   Specimen: Nasopharyngeal Swab  Result Value Ref Range Status   SARS Coronavirus 2 by RT  PCR NEGATIVE NEGATIVE Final    Comment: (NOTE) SARS-CoV-2 target nucleic acids are NOT DETECTED.  The SARS-CoV-2 RNA is generally detectable in upper respiratoy specimens during the acute phase of infection. The lowest concentration of SARS-CoV-2 viral copies this assay can detect is 131 copies/mL. A negative result does not preclude SARS-Cov-2 infection and should not be used as the sole basis for treatment or other patient management decisions. A negative result may occur with  improper specimen collection/handling, submission of specimen other than nasopharyngeal swab, presence of viral mutation(s) within the areas targeted by  this assay, and inadequate number of viral copies (<131 copies/mL). A negative result must be combined with clinical observations, patient history, and epidemiological information. The expected result is Negative.  Fact Sheet for Patients:  PinkCheek.be  Fact Sheet for Healthcare Providers:  GravelBags.it  This test is no t yet approved or cleared by the [place] FDA and  has been authorized for detection and/or diagnosis of SARS-CoV-2 by FDA under an Emergency Use Authorization (EUA). This EUA will remain  in effect (meaning this test can be used) for the duration of the COVID-19 declaration under Section 564(b)(1) of the Act, 21 U.S.C. section 360bbb-3(b)(1), unless the authorization is terminated or revoked sooner.     Influenza A by PCR NEGATIVE NEGATIVE Final   Influenza B by PCR NEGATIVE NEGATIVE Final    Comment: (NOTE) The Xpert Xpress SARS-CoV-2/FLU/RSV assay is intended as an aid in  the diagnosis of influenza from Nasopharyngeal swab specimens and  should not be used as a sole basis for treatment. Nasal washings and  aspirates are unacceptable for Xpert Xpress SARS-CoV-2/FLU/RSV  testing.  Fact Sheet for Patients: PinkCheek.be  Fact Sheet for  Healthcare Providers: GravelBags.it  This test is not yet approved or cleared by the [place] FDA and  has been authorized for detection and/or diagnosis of SARS-CoV-2 by  FDA under an Emergency Use Authorization (EUA). This EUA will remain  in effect (meaning this test can be used) for the duration of the  Covid-19 declaration under Section 564(b)(1) of the Act, 21  U.S.C. section 360bbb-3(b)(1), unless the authorization is  terminated or revoked. Performed at [facility], [address] [place], [place] [address]       Radiology Studies: DG Chest 2 View  Result Date: 06/02/2020 CLINICAL DATA:  Weakness, shortness of breath EXAM: CHEST - 2 VIEW COMPARISON:  05/26/2020 FINDINGS: Frontal and lateral views of the chest demonstrate a stable cardiac silhouette. Decreased left pleural effusion and left basilar consolidation. New right pleural effusion, obscuring the posterior costophrenic angle and seen along the major fissure. Right lower lobe consolidation is noted. No pneumothorax. No acute bony abnormalities. IMPRESSION: 1. Bibasilar consolidation, favor atelectasis. 2. Bilateral pleural effusions. Electronically Signed   By: Randa Ngo M.D.   On: 06/02/2020 16:49   DG Pelvis 1-2 Views  Result Date: 06/02/2020 CLINICAL DATA:  [place], pain EXAM: PELVIS - 1-2 VIEW COMPARISON:  05/26/2020, 04/24/2020 FINDINGS: Single frontal view of the pelvis demonstrates partial visualization of a right hip arthroplasty. No acute displaced fractures. Stable sclerosis of the right side pubic symphysis. Stable spondylosis at the lumbosacral junction. Diffuse vascular calcifications unchanged. IMPRESSION: 1. Stable appearance of the pelvis.  No acute fractures. Electronically Signed   By: Randa Ngo M.D.   On: 06/02/2020 20:48   CT ANGIO CHEST PE W OR WO CONTRAST  Result Date: 06/02/2020 CLINICAL DATA:  Hypoxia, dyspnea EXAM: CT ANGIOGRAPHY CHEST WITH  CONTRAST TECHNIQUE: Multidetector CT imaging of the chest was performed using the standard protocol during bolus administration of intravenous contrast. Multiplanar CT image reconstructions and MIPs were obtained to evaluate the vascular anatomy. CONTRAST:  60mL OMNIPAQUE IOHEXOL 350 MG/ML SOLN COMPARISON:  04/24/2020 FINDINGS: Cardiovascular: There is excellent opacification of the pulmonary arterial tree. There is no intraluminal filling defect identified to suggest acute pulmonary embolism. The central pulmonary arteries are of normal caliber. Moderate to severe multi-vessel coronary artery calcification. Global cardiac size is within normal limits. No pericardial effusion. There is extensive atherosclerotic calcification within the thoracic  aorta. No aortic aneurysm. Mediastinum/Nodes: Thyroid unremarkable. No pathologic thoracic adenopathy. Esophagus unremarkable Lungs/Pleura: Mild centrilobular emphysema. Complete collapse of the right middle lobe has developed. A central obstructing lesion is not identified, however. There has developed moderate bilateral pleural effusions, left greater than right, with subtotal collapse of the lower lobes bilaterally, left greater than right. Superimposed focal consolidation is seen within the basilar lingula, likely infectious or inflammatory in the acute setting. No pneumothorax. Upper Abdomen: No acute abnormality. Musculoskeletal: There has developed extensive dependent subcutaneous body wall edema within the posterior thoracoabdominal wall, new since prior examination, nonspecific. This could reflect dependent edema in the setting of anasarca, or by a local inflammatory process such as cellulitis. No acute bone abnormality. Review of the MIP images confirms the above findings. IMPRESSION: No pulmonary embolism. Moderate to severe multi-vessel coronary artery calcification. Interval development of complete right middle lobe collapse and subtotal collapse of the lower  lobes bilaterally, likely passive related to new moderate bilateral pleural effusions. Focal pulmonary infiltrate within the basilar lingula, likely infectious in the acute setting. Aspiration could appear similarly. Dependent subcutaneous body wall edema within the posterior thoracoabdominal wall, new since prior examination. Correlation with clinical examination would be helpful to exclude a local inflammatory process such as cellulitis. Alternatively, this could be seen in the setting of anasarca and resultant dependent body wall edema. Aortic Atherosclerosis (ICD10-I70.0) and Emphysema (ICD10-J43.9). Electronically Signed   By: Fidela Salisbury MD   On: 06/02/2020 23:33    Scheduled Meds: . sodium chloride   Intravenous Once  . enoxaparin (LOVENOX) injection  40 mg Subcutaneous Q24H  . furosemide  40 mg Intravenous Once  . guaiFENesin  600 mg Oral BID  . triamcinolone   Topical BID   And  . hydrocerin   Topical BID  . rOPINIRole  1 mg Oral QHS  . sodium chloride flush  3 mL Intravenous Q12H   Continuous Infusions: . sodium chloride    . ampicillin-sulbactam (UNASYN) IV Stopped (06/03/20 1040)  . azithromycin Stopped (06/03/20 0158)     LOS: 1 day   Time spent: 40 minutes   Rasheda Ledger Loann Quill, MD [facility]  If 7PM-7AM, please contact night-coverage [email or web address] 06/03/2020, 2:56 PM

## 2020-06-03 NOTE — ED Notes (Signed)
Patient lying in bed and appears to be asleep, NAD observed and will continue to monitor

## 2020-06-03 NOTE — ED Notes (Signed)
Pt moved to hospital bed. Requesting med for RLS

## 2020-06-03 NOTE — Plan of Care (Signed)
  Problem: Clinical Measurements: Goal: Ability to maintain clinical measurements within normal limits will improve Outcome: Progressing   Problem: Clinical Measurements: Goal: Will remain free from infection Outcome: Progressing   Problem: Clinical Measurements: Goal: Diagnostic test results will improve Outcome: Progressing   Problem: Clinical Measurements: Goal: Respiratory complications will improve Outcome: Progressing   Problem: Safety: Goal: Ability to remain free from injury will improve Outcome: Progressing   Problem: Pain Managment: Goal: General experience of comfort will improve Outcome: Progressing

## 2020-06-03 NOTE — Progress Notes (Signed)
Pharmacy Antibiotic Note  Mariah [name] is a 74 y.o. female admitted on 06/02/2020 with pneumonia.  Pharmacy has been consulted for Unasyn dosing.  Plan: Unasyn 3gm IV q8hrs  Height: 5\' 1"  (154.9 cm) Weight: 58.4 kg (128 lb 12 oz) IBW/kg (Calculated) : 47.8  Temp (24hrs), Avg:98.5 F (36.9 C), Min:98.1 F (36.7 C), Max:98.9 F (37.2 C)  Recent Labs  Lab 05/27/20 1132 05/27/20 1914 05/28/20 0030 05/28/20 0553 05/28/20 0900 06/02/20 1613 06/02/20 1747  WBC 4.9 4.3 4.0 4.5  --   --  4.8  CREATININE  --   --   --   --  1.13* 0.79  --   LATICACIDVEN  --   --   --   --   --   --  1.1    Estimated Creatinine Clearance: 49.9 mL/min (by C-G formula based on SCr of 0.79 mg/dL).    Allergies  Allergen Reactions  . Clindamycin/Lincomycin     diarrhea  . Amoxicillin Diarrhea    Antimicrobials this admission:   >>    >>   Dose adjustments this admission:   Microbiology results:  BCx:   UCx:    Sputum:    MRSA PCR:   Thank you for allowing pharmacy to be a part of this patient's care.  Hart Robinsons A 06/03/2020 5:11 AM

## 2020-06-03 NOTE — Evaluation (Addendum)
Clinical/Bedside Swallow Evaluation Patient Details  Name: Mariah [name] MRN: [id number] Date of Birth: October 03, 1945  Today's Date: 06/03/2020 Time: SLP Start Time (ACUTE ONLY): 1400 SLP Stop Time (ACUTE ONLY): 1500 SLP Time Calculation (min) (ACUTE ONLY): 60 min  Past Medical History:  Past Medical History:  Diagnosis Date  . Abdominal aortic aneurysm (AAA) ([place])   . Arthritis   . COPD (chronic obstructive pulmonary disease) ([place])   . GERD (gastroesophageal reflux disease)   . Headache    sinus  . History of adenomatous polyp of colon   . Hyperlipidemia   . Hypertension   . Psoriasis   . Right lumbar radiculitis   . Vitamin D deficiency    Past Surgical History:  Past Surgical History:  Procedure Laterality Date  . APPENDECTOMY  2000  . BREAST BIOPSY Right 09/2016   radial scar;COMPLEX SCLEROSING LESION  . BREAST LUMPECTOMY Right 11/24/2016   COMPLEX SCLEROSING LESION  . BREAST LUMPECTOMY WITH NEEDLE LOCALIZATION Right 11/24/2016   Procedure: BREAST LUMPECTOMY WITH NEEDLE LOCALIZATION;  Surgeon: Leonie Green, MD;  Location: [facility] ORS;  Service: General;  Laterality: Right;  . COLONOSCOPY WITH PROPOFOL N/A 08/10/2016   Procedure: COLONOSCOPY WITH PROPOFOL;  Surgeon: Manya Silvas, MD;  Location: [facility] ENDOSCOPY;  Service: Endoscopy;  Laterality: N/A;  . ESOPHAGOGASTRODUODENOSCOPY (EGD) WITH PROPOFOL N/A 08/10/2016   Procedure: ESOPHAGOGASTRODUODENOSCOPY (EGD) WITH PROPOFOL;  Surgeon: Manya Silvas, MD;  Location: [facility] ENDOSCOPY;  Service: Endoscopy;  Laterality: N/A;  . FOOT SURGERY Bilateral   . JOINT REPLACEMENT Right 01/27/2016   hip  . 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 TOTAL HIP ARTHROPLASTY Right 01/27/2016   Procedure: TOTAL HIP ARTHROPLASTY;  Surgeon: Dereck Leep, MD;  Location: [facility] ORS;  Service: Orthopedics;  Laterality: Right;   HPI:  Pt is a 74 y.o. female with medical history significant of DVT,  A.fib, recent fall, COPD, AAA, GERD, HLD, HTN dementia.  Chest CT (11/14): "Interval development of complete right middle lobe collapse and subtotal collapse of the lower lobes bilaterally, likely passive related to new moderate bilateral pleural effusions.; Focal pulmonary infiltrate within the basilar lingula, likely infectious in the acute setting. Aspiration could appear similarly.; Dependent subcutaneous body wall edema within the posterior thoracoabdominal wall, new since prior examination. Correlation with clinical examination would be helpful to exclude a local inflammatory process such as cellulitis. Alternatively, this could be seen in the setting of anasarca and resultant dependent body wall edema."  Assessment / Plan / Recommendation Clinical Impression  Pt was seen bedside for clinical swallow evaluation. No Overt clinical s/s of aspiration or dysphagia were noted. Per chart, p/t has baseline h/o Dementia. Unsure of pt cognitive baseline otherwise-- [name]. Alert & cooperative throughout, min intermittently Verbose. CXR: "Interval development of complete right middle lobe collapse and subtotal collapse of the lower lobes bilaterally, likely passive related to new moderate bilateral pleural effusions.; Focal pulmonary infiltrate within the basilar lingula, likely infectious in the acute setting. Aspiration could appear similarly. Dependent subcutaneous body wall edema within the posterior thoracoabdominal wall, new since prior examination. Correlation with clinical examination would be helpful to exclude a local inflammatory process such as cellulitis. Alternatively, this could be seen in the setting of anasarca and resultant dependent body wall edema." WBC 4.6; Afebrile; [place] O2 3L at this eval. Pt and family present denied any difficulty swallowing at home -- eats a regular diet at home.    Upon clinician arrival to the room, pt  was in bed w/ NSG & family (niece). Some fast-food was on tray, brought in by family. Pt required min adjustment for  upright positioning at 90 degrees in bed. Foods appeared min eaten at the time of clinician arrival. Per niece, pt takes "a long time to eat, about an hour per meal". Niece believes this is due to pt missing dentiton (confirmed w/ pt interview & oral mech exam). Pt was awake, alert, cooperative & pleasant throughout the session. Distractions reduced as possible. Pt had just used the bedside commode.   Pt was assessed directly with PO trials of 10x solids, 10x puree via spoon, and 10x sips of thin liquids via straw; No Overt Clinical s/s of aspiration were noted. Pt was able to self-feed all consistencies with tray set up. No cues were required for pt to attend and follow through w/ task of eating. Oral phase appears WNL-- oral prep WFL, oral transit timely for A-P transfer of bolus, mastication WFL, no oral residue noted between PO's. No pharyngeal phase s/s noted. Voice clear during/after PO trials. Pt demonstrated independent use of safe swallowing strategies (sitting upright, taking small/single sips of liquids, alternating solids and liquids) and verbalized her understanding of these strategies.   Recommend continuing on Regular diet w/ thins VIA CUP--straws OK, but pt reports cup drinking at home. Meds whole in puree for safer swallowing. General aspiration precautions. Pt appears at reduced risk for aspiration following general aspiration precautions; appears to be tolerating current (regular) diet. Emphasis on safe swallowing strategies of: Reducing Distractions, Alternating Solids/Liquids, and Softened Solids as needed for ease of PO task d/t pt dentition status. Education provided. NSG/MD updated. NSG to reconsult if further needs arise. ST services to sign off at this time w/ NSG to reconsult if new needs arise.   SLP Visit Diagnosis: Dysphagia, unspecified (R13.10)    Aspiration Risk  No limitations    Diet Recommendation  Regular diet w/ thins VIA CUP--straws OK, but pt reports cup drinking  at home; Meds whole in puree for safer swallowing as needed  Medication Administration: Whole meds with puree    Other  Recommendations Oral Care Recommendations: Oral care BID   Follow up Recommendations None      Frequency and Duration   n/a         Prognosis Prognosis for Safe Diet Advancement: Good Barriers to Reach Goals: Cognitive deficits;Time post onset      Swallow Study   General Date of Onset: 06/02/20 HPI: Pt is a 74 y.o. female with medical history significant of DVT,  A.fib, recent fall, COPD, AAA, GERD, HLD, HTN dementia Type of Study: Bedside Swallow Evaluation Previous Swallow Assessment: n/a Diet Prior to this Study: Regular Temperature Spikes Noted: No Respiratory Status: Room air History of Recent Intubation: No Behavior/Cognition: Alert;Cooperative;Pleasant mood;Agitated Oral Cavity Assessment: Within Functional Limits Oral Care Completed by SLP: No Oral Cavity - Dentition: Missing dentition Vision: Functional for self-feeding Self-Feeding Abilities: Able to feed self;Needs set up Patient Positioning: Upright in bed Baseline Vocal Quality: Normal    Oral/Motor/Sensory Function Overall Oral Motor/Sensory Function: Within functional limits   Ice Chips Ice chips: Not tested   Thin Liquid Thin Liquid: Within functional limits Presentation: Straw Other Comments: 10x    Nectar Thick Nectar Thick Liquid: Not tested   Honey Thick Honey Thick Liquid: Not tested   Puree Puree: Within functional limits Presentation: Self Fed Other Comments: 10x   Solid     Solid: Within functional limits Presentation: Self Fed;Spoon  Other Comments: 10x      Quintella Baton  Graduate Clinician 06/03/2020,4:08 PM  The information in this patient note, response to treatment, and overall treatment plan developed has been reviewed and agreed upon after reviewing documentation. This session was performed under the supervision of this licensed clinician.   Orinda Kenner, MS, [place] Speech Language Pathologist Rehab Services [phone]

## 2020-06-03 NOTE — Telephone Encounter (Signed)
She is currently hospitalized due to pneumonia and worsening respiratory status.  No changes to medications currently.  These are being managed in hospital

## 2020-06-03 NOTE — Progress Notes (Signed)
Report called to receiving RN [id number]. Patient with no complaints at the current time. Patient currently receiving swallow eval then will transfer to floor.

## 2020-06-03 NOTE — Consult Note (Signed)
WOC Nurse Consult Note: Patient receiving care in [facility] ED 31.  Consult completed remotely after review of record.  Images of affected areas not available at this time. Reason for Consult: multiple excoriations Wound type: scratch marks Pressure Injury POA: Yes/No/NA Measurement: Wound bed: Drainage (amount, consistency, odor)  Periwound: Dressing procedure/placement/frequency: Orders on are the chart for Kenalog and Eucerin.  AFTER applying Kenalog or Eucerin cream to excoriated areas, you can place Vaseline gauze over the sites and secure with Kerlex for extremity wounds, if this might be helpful to the patient in any way; otherwise, leave open to air. Monitor the wound area(s) for worsening of condition such as: Signs/symptoms of infection,  Increase in size,  Development of or worsening of odor, Development of pain, or increased pain at the affected locations.  Notify the medical team if any of these develop.  Thank you for the consult. [place] nurse will not follow at this time.  Please re-consult the [place] team if needed.  Val Riles, RN, MSN, CWOCN, CNS-BC, pager [phone]

## 2020-06-03 NOTE — Progress Notes (Signed)
PT Cancellation Note  Patient Details Name: Mariah [name] MRN: [id number] DOB: 21-Nov-1945   Cancelled Treatment:    Reason Eval/Treat Not Completed: Medical issues which prohibited therapy (Pt has ABLA below safe cutoff for PT services. WIll evaluate once appropriate.)  9:07 AM, 06/03/20 [name], PT, DPT Physical Therapist - [facility]  [phone] ([place])    [name] C 06/03/2020, 9:07 AM

## 2020-06-03 NOTE — Progress Notes (Signed)
OT Cancellation Note  Patient Details Name: [name] MRN: [id number] DOB: 06/28/1946   Cancelled Treatment:    Reason Eval/Treat Not Completed: Medical issues which prohibited therapy. Chart reviewed - pt noted to have H&H critically low (6.3 & 19.9); contraindicated for exertional activity at this time. Will continue to follow and initiate services as pt medically appropriate to participate in therapy.   Dessie Coma, M.S. OTR/L  06/03/20, 8:46 AM  ascom [phone]

## 2020-06-04 ENCOUNTER — Inpatient Hospital Stay: Payer: PPO

## 2020-06-04 LAB — TYPE AND SCREEN
ABO/RH(D): B NEG
Antibody Screen: NEGATIVE
Unit division: 0
Unit division: 0
Unit division: 0

## 2020-06-04 LAB — CBC
HCT: 31.5 % — ABNORMAL LOW (ref 36.0–46.0)
Hemoglobin: 10.2 g/dL — ABNORMAL LOW (ref 12.0–15.0)
MCH: 29.7 pg (ref 26.0–34.0)
MCHC: 32.4 g/dL (ref 30.0–36.0)
MCV: 91.8 fL (ref 80.0–100.0)
Platelets: 153 10*3/uL (ref 150–400)
RBC: 3.43 MIL/uL — ABNORMAL LOW (ref 3.87–5.11)
RDW: 15.9 % — ABNORMAL HIGH (ref 11.5–15.5)
WBC: 3.3 10*3/uL — ABNORMAL LOW (ref 4.0–10.5)
nRBC: 0 % (ref 0.0–0.2)

## 2020-06-04 LAB — LACTATE DEHYDROGENASE, PLEURAL OR PERITONEAL FLUID: LD, Fluid: 56 U/L — ABNORMAL HIGH (ref 3–23)

## 2020-06-04 LAB — ALBUMIN, PLEURAL OR PERITONEAL FLUID: Albumin, Fluid: <1 g/dL

## 2020-06-04 LAB — BASIC METABOLIC PANEL
Anion gap: 10 (ref 5–15)
BUN: 12 mg/dL (ref 8–23)
CO2: 30 mmol/L (ref 22–32)
Calcium: 7.7 mg/dL — ABNORMAL LOW (ref 8.9–10.3)
Chloride: 97 mmol/L — ABNORMAL LOW (ref 98–111)
Creatinine, Ser: 1.02 mg/dL — ABNORMAL HIGH (ref 0.44–1.00)
GFR, Estimated: 57 mL/min — ABNORMAL LOW (ref >60)
Glucose, Bld: 86 mg/dL (ref 70–99)
Potassium: 3.4 mmol/L — ABNORMAL LOW (ref 3.5–5.1)
Sodium: 137 mmol/L (ref 135–145)

## 2020-06-04 LAB — BODY FLUID CELL COUNT WITH DIFFERENTIAL
Eos, Fluid: 0 %
Lymphs, Fluid: 34 %
Monocyte-Macrophage-Serous Fluid: 43 %
Neutrophil Count, Fluid: 23 %
Total Nucleated Cell Count, Fluid: 184 cu mm

## 2020-06-04 LAB — PROTEIN, PLEURAL OR PERITONEAL FLUID: Total protein, fluid: <3 g/dL

## 2020-06-04 LAB — BPAM RBC
Blood Product Expiration Date: 202112112359
Blood Product Expiration Date: 202112202359
Blood Product Expiration Date: 202112252359
ISSUE DATE / TIME: 202111150812
ISSUE DATE / TIME: 202111151352
Unit Type and Rh: 1700
Unit Type and Rh: 1700
Unit Type and Rh: 1700

## 2020-06-04 LAB — GLUCOSE, PLEURAL OR PERITONEAL FLUID: Glucose, Fluid: 109 mg/dL

## 2020-06-04 LAB — PREPARE RBC (CROSSMATCH)

## 2020-06-04 LAB — MAGNESIUM: Magnesium: 1.8 mg/dL (ref 1.7–2.4)

## 2020-06-04 LAB — PATHOLOGIST SMEAR REVIEW

## 2020-06-04 MED ORDER — ASCORBIC ACID 500 MG PO TABS
250.0000 mg | ORAL_TABLET | Freq: Two times a day (BID) | ORAL | Status: DC
  Administered 2020-06-04 – 2020-06-07 (×6): 250 mg via ORAL
  Filled 2020-06-04 (×6): qty 1

## 2020-06-04 MED ORDER — ADULT MULTIVITAMIN W/MINERALS CH
1.0000 | ORAL_TABLET | Freq: Every day | ORAL | Status: DC
  Administered 2020-06-05 – 2020-06-07 (×3): 1 via ORAL
  Filled 2020-06-04 (×3): qty 1

## 2020-06-04 MED ORDER — PANTOPRAZOLE SODIUM 20 MG PO TBEC
20.0000 mg | DELAYED_RELEASE_TABLET | Freq: Two times a day (BID) | ORAL | Status: DC
  Administered 2020-06-04 – 2020-06-07 (×6): 20 mg via ORAL
  Filled 2020-06-04 (×8): qty 1

## 2020-06-04 MED ORDER — ENSURE ENLIVE PO LIQD
237.0000 mL | Freq: Two times a day (BID) | ORAL | Status: DC
  Administered 2020-06-05 – 2020-06-07 (×3): 237 mL via ORAL

## 2020-06-04 MED ORDER — ASPIRIN EC 81 MG PO TBEC
81.0000 mg | DELAYED_RELEASE_TABLET | Freq: Every day | ORAL | Status: DC
  Administered 2020-06-04 – 2020-06-07 (×4): 81 mg via ORAL
  Filled 2020-06-04 (×4): qty 1

## 2020-06-04 MED ORDER — METOPROLOL SUCCINATE ER 50 MG PO TB24
50.0000 mg | ORAL_TABLET | Freq: Every day | ORAL | Status: DC
  Administered 2020-06-04 – 2020-06-07 (×4): 50 mg via ORAL
  Filled 2020-06-04 (×3): qty 1

## 2020-06-04 MED ORDER — FUROSEMIDE 40 MG PO TABS
40.0000 mg | ORAL_TABLET | Freq: Every day | ORAL | Status: DC
  Administered 2020-06-04 – 2020-06-07 (×4): 40 mg via ORAL
  Filled 2020-06-04 (×3): qty 1

## 2020-06-04 MED ORDER — SIMVASTATIN 20 MG PO TABS
20.0000 mg | ORAL_TABLET | Freq: Every day | ORAL | Status: DC
  Administered 2020-06-04 – 2020-06-06 (×3): 20 mg via ORAL
  Filled 2020-06-04 (×3): qty 1

## 2020-06-04 MED ORDER — ROPINIROLE HCL 1 MG PO TABS: 1.0000 mg | ORAL_TABLET | Freq: Every day | ORAL | Status: DC

## 2020-06-04 MED ORDER — FLUTICASONE FUROATE-VILANTEROL 100-25 MCG/INH IN AEPB
1.0000 | INHALATION_SPRAY | Freq: Every day | RESPIRATORY_TRACT | Status: DC
  Administered 2020-06-04 – 2020-06-07 (×4): 1 via RESPIRATORY_TRACT
  Filled 2020-06-04: qty 28

## 2020-06-04 MED ORDER — METHYLPREDNISOLONE SODIUM SUCC 40 MG IJ SOLR
40.0000 mg | Freq: Every day | INTRAMUSCULAR | Status: DC
  Administered 2020-06-04 – 2020-06-05 (×2): 40 mg via INTRAVENOUS
  Filled 2020-06-04 (×3): qty 1

## 2020-06-04 MED ORDER — DONEPEZIL HCL 5 MG PO TABS
5.0000 mg | ORAL_TABLET | Freq: Every day | ORAL | Status: DC
  Administered 2020-06-04 – 2020-06-06 (×3): 5 mg via ORAL
  Filled 2020-06-04 (×4): qty 1

## 2020-06-04 MED ORDER — POTASSIUM CHLORIDE 20 MEQ PO PACK
40.0000 meq | PACK | Freq: Once | ORAL | Status: AC
  Administered 2020-06-04: 40 meq via ORAL
  Filled 2020-06-04: qty 2

## 2020-06-04 MED ORDER — NICOTINE 7 MG/24HR TD PT24
7.0000 mg | MEDICATED_PATCH | Freq: Every day | TRANSDERMAL | Status: DC
  Administered 2020-06-04 – 2020-06-07 (×4): 7 mg via TRANSDERMAL
  Filled 2020-06-04 (×4): qty 1

## 2020-06-04 MED ORDER — ALBUTEROL SULFATE HFA 108 (90 BASE) MCG/ACT IN AERS
2.0000 | INHALATION_SPRAY | RESPIRATORY_TRACT | Status: DC | PRN
  Filled 2020-06-04: qty 6.7

## 2020-06-04 MED ORDER — GABAPENTIN 300 MG PO CAPS
300.0000 mg | ORAL_CAPSULE | Freq: Two times a day (BID) | ORAL | Status: DC
  Administered 2020-06-04 – 2020-06-07 (×6): 300 mg via ORAL
  Filled 2020-06-04 (×6): qty 1

## 2020-06-04 MED ORDER — FE FUMARATE-B12-VIT C-FA-IFC PO CAPS
1.0000 | ORAL_CAPSULE | Freq: Two times a day (BID) | ORAL | Status: DC
  Administered 2020-06-04 – 2020-06-07 (×6): 1 via ORAL
  Filled 2020-06-04 (×7): qty 1

## 2020-06-04 NOTE — Evaluation (Signed)
Physical Therapy Evaluation Patient Details Name: Mariah [name] MRN: [id number] DOB: September 10, 1945 Today's Date: 06/04/2020   History of Present Illness  Mariah [name] is a 59yoF who comes to [facility] 11/14 c SOB. Pt recent dC to home on 2L on 11/9. CXR showing new large Rt effusion. BNP 616, Hb: 6.9. PMH: DVT, AF, recent fall, COPD, AAA, GERD, HLD, HTN, dementia.  Clinical Impression  Pt admitted with above diagnosis. Pt currently with functional limitations due to the deficits listed below (see "PT Problem List"). Upon entry, pt in bed, awake and agreeable to participate. Author in room prior to eval collecting background from patient's niece (caregiver). The pt is alert and oriented x3, pleasant, conversational, and generally a good historian. Supervision for bed mobility to EOB (min-modA of legs back to supine), Supervision for transfers c RW, minguard assist for very slow AMB c RW on room air, requires standing recovery intervals due to DOE, albeit no frank desaturation. Functional mobility assessment demonstrates increased effort/time requirements, poor tolerance, and need for physical assistance, whereas the patient performed these at a higher level of independence PTA. Pt will benefit from skilled PT intervention to increase independence and safety with basic mobility in preparation for discharge to the venue listed below.       Follow Up Recommendations SNF;Supervision for mobility/OOB    Equipment Recommendations  None recommended by PT    Recommendations for Other Services       Precautions / Restrictions Precautions Precautions: Fall Precaution Comments: monitor O2 Restrictions RUE Weight Bearing: Weight bearing as tolerated Other Position/Activity Restrictions: per smart chart with hospitalist and Dr. Roland Rack consult (11/16)      Mobility  Bed Mobility Overal bed mobility: Needs Assistance Bed Mobility: Supine to Sit;Sit to Supine     Supine to sit: HOB  elevated;Supervision Sit to supine: HOB elevated;Supervision   General bed mobility comments: moderate effort, additional time.    Transfers Overall transfer level: Needs assistance Equipment used: Rolling walker (2 wheeled);None Transfers: Sit to/from Stand Sit to Stand: Supervision         General transfer comment: moderate effort, 1 failted start, no LOB  Ambulation/Gait Ambulation/Gait assistance: Min guard Gait Distance (Feet): 80 Feet Assistive device: Rolling walker (2 wheeled) Gait Pattern/deviations: Step-to pattern Gait velocity: 0.14m/s   General Gait Details: moved from 3L to room air at entry: 50ft c HHA, then 43ft c RW, both on room air, terminal SpO2: 92%.  Stairs            Wheelchair Mobility    Modified Rankin (Stroke Patients Only)       Balance Overall balance assessment: History of Falls;Mild deficits observed, not formally tested;Modified Independent                                           Pertinent Vitals/Pain Pain Assessment: No/denies pain    Home Living Family/patient expects to be discharged to:: Private residence Living Arrangements: Alone Available Help at Discharge: Family;Friend(s);Available 24 hours/day Type of Home: House Home Access: Stairs to enter Entrance Stairs-Rails: Can reach both;Left;Right Entrance Stairs-Number of Steps: 4 Home Layout: One level Home Equipment: Walker - 2 wheels;Cane - quad;Cane - single point;Bedside commode Additional Comments: Neice reports hoarder like house c no room for RW. Mod I with SPC; furniture walker; in past week: needed help with dressing, bed moblity (due to pain); formerly was showering.  2L o2 baseline - pt reports limited use    Prior Function Level of Independence: Independent with assistive device(s)         Comments: 2 falls in past 6 months; was using LUE SPC at DC     Hand Dominance   Dominant Hand: Right    Extremity/Trunk Assessment   Upper  Extremity Assessment Upper Extremity Assessment: Overall WFL for tasks assessed;Generalized weakness    Lower Extremity Assessment Lower Extremity Assessment: Overall WFL for tasks assessed;Generalized weakness       Communication   Communication: HOH  Cognition Arousal/Alertness: Awake/alert Behavior During Therapy: WFL for tasks assessed/performed Overall Cognitive Status: Within Functional Limits for tasks assessed                                        General Comments      Exercises     Assessment/Plan    PT Assessment Patient needs continued PT services  PT Problem List Decreased balance;Pain;Cardiopulmonary status limiting activity;Decreased mobility;Decreased activity tolerance;Decreased skin integrity;Decreased safety awareness;Decreased knowledge of use of DME;Decreased strength       PT Treatment Interventions DME instruction;Functional mobility training;Balance training;Patient/family education;Neuromuscular re-education;Therapeutic activities;Gait training;Stair training;Therapeutic exercise;Manual techniques    PT Goals (Current goals can be found in the Care Plan section)  Acute Rehab PT Goals Patient Stated Goal: to return to PLOF PT Goal Formulation: With patient Time For Goal Achievement: 06/10/20 Potential to Achieve Goals: Good    Frequency Min 2X/week   Barriers to discharge Decreased caregiver support      Co-evaluation               AM-PAC PT "6 Clicks" Mobility  Outcome Measure Help needed turning from your back to your side while in a flat bed without using bedrails?: None Help needed moving from lying on your back to sitting on the side of a flat bed without using bedrails?: None Help needed moving to and from a bed to a chair (including a wheelchair)?: A Little Help needed standing up from a chair using your arms (e.g., wheelchair or bedside chair)?: A Little Help needed to walk in hospital room?: A Little Help  needed climbing 3-5 steps with a railing? : A Lot 6 Click Score: 19    End of Session Equipment Utilized During Treatment: Gait belt;Oxygen Activity Tolerance: Patient tolerated treatment well;No increased pain;Patient limited by fatigue Patient left: in bed;with call bell/phone within reach;with family/visitor present;with bed alarm set Nurse Communication: Mobility status PT Visit Diagnosis: Unsteadiness on feet (R26.81);Difficulty in walking, not elsewhere classified (R26.2);Pain Pain - Right/Left: Right Pain - part of body: Hip    Time: 1540-1605 PT Time Calculation (min) (ACUTE ONLY): 25 min   Charges:   PT Evaluation $PT Eval Moderate Complexity: 1 Mod PT Treatments $Therapeutic Exercise: 8-22 mins       4:19 PM, 06/04/20 [name], PT, DPT Physical Therapist - [facility]  [phone] ([place])   Isaic Syler C 06/04/2020, 4:14 PM

## 2020-06-04 NOTE — Progress Notes (Signed)
   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  Mild to moderate TR.  Mild MR.  She presented with complaints of weakness, SOB.  Co morbidities:  Arthritis COPD-02 2 liters at home Hypertension Hyperlipidemia CKD stage 3  Atrial Fibrillation  Tobacco abuse  Medications:  ASA 81 mg daily Lasix 40 mg daily Metoprolol succinate 50 mg daily   Labs:  Sodium 137, potassium 3.4, BUN 12, creatinine 1.02, BNP 616,magnesium 1.8, prealbumin 7.06, albumin 2,7,  Left sided thoracentesis- 600 ml clear yellow fluid.  BP 135/91 pulse 59 BMI 25.08  Intake 1362 ml Output 2150 ml  Assessment:  General- she is awake and alert, no acute distress  HEENT- normocephalic, pupils equal  Cardiac -heart tones regular, no murmur or rubs  Chest- breath sounds diminished in the bases, with faint inspiratory wheezes.  Musculoskeletal- no lower extremity edema.  Psych- is pleasant and appropriate  Neuro-speech is clear.  Discussed heart failure she thinks she has a scale but does not weigh herself, discussed the daily routine and to record.  She states her granddaughter brings her meals that consists of meat and vegetable, or salad.  She does not add salt at the table. She states she is planning to go live with her.  Discussed heart failure teaching booklet and zone magnet.  She is viewing heart failure videos.  Will continue to follow.  Pricilla Riffle RN, CHFN

## 2020-06-04 NOTE — Evaluation (Signed)
Occupational Therapy Evaluation Patient Details Name: Mariah [name] MRN: [id number] DOB: 1946-01-27 Today's Date: 06/04/2020    History of Present Illness Mariah [name] is a 54yoF who comes to [facility] 11/14 c SOB. Pt recent dC to home on 2L on 11/9. CXR showing new large Rt effusion. BNP 616, Hb: 6.9. PMH: DVT, AF, recent fall, COPD, AAA, GERD, HLD, HTN, dementia.    Clinical Impression   Mariah [name] was seen for OT evaluation this date. Prior to hospital admission, pt was MOD I using SPC in home and 2L O2 (removes for smoking and when sitting). Pt endorses 2 falls in last 3 months and neice reports no room in house for RW 2/2 clutter. Pt lives alone with niece providing assist last ~week for I/ADLs and mobility. Pt presents to acute OT demonstrating impaired ADL performance and functional mobility 2/2 decreased safety awareness, functional strength/balance deficits, decreased LB access, and decreased activity tolerance.   Upon arrival NT reports pt requesting toileting - pt reports immediate need and ultimately unable to make it to toilet (runny BM standing at commode). MIN A exit L side of bed. MIN A + RW for toilet t/f - assist for lines/leads mgmt. MAX A + RW perihygiene standing at regular commode. Pt currently requires MOD A for LBD seated on BSC. Unable to obtain O2 reading despite x3 pulse ox used and every digit attempted; reports SOB c mobility, resolved c rest. Pt would benefit from skilled OT to address noted impairments and functional limitations (see below for any additional details) in order to maximize safety and independence while minimizing falls risk and caregiver burden. Upon hospital discharge, recommend STR to maximize pt safety and return to PLOF.     Follow Up Recommendations  SNF    Equipment Recommendations  3 in 1 bedside commode    Recommendations for Other Services       Precautions / Restrictions Precautions Precautions: Fall Restrictions Weight Bearing  Restrictions: Yes RUE Weight Bearing: Weight bearing as tolerated      Mobility Bed Mobility Overal bed mobility: Needs Assistance Bed Mobility: Supine to Sit;Sit to Supine     Supine to sit: Min assist Sit to supine: Min guard;HOB elevated   General bed mobility comments: Pt used BUE to assist BLE return to bed c increased time     Transfers Overall transfer level: Needs assistance Equipment used: Rolling walker (2 wheeled) Transfers: Sit to/from Stand Sit to Stand: Min assist    General transfer comment: assist to stabilize RW sit<>stand at bed and commode height    Balance Overall balance assessment: Needs assistance Sitting-balance support: No upper extremity supported;Feet supported Sitting balance-Leahy Scale: Good     Standing balance support: Bilateral upper extremity supported Standing balance-Leahy Scale: Fair Standing balance comment: BUE support on RW in standing        ADL either performed or assessed with clinical judgement   ADL Overall ADL's : Needs assistance/impaired           General ADL Comments: MOD A for LBD seated on BSC. MAX A + RW perihygiene standing at regular commode. MIN A + RW for toilet t/f - assist for lines/leads mgmt.                   Pertinent Vitals/Pain Pain Assessment: No/denies pain     Hand Dominance Right   Extremity/Trunk Assessment Upper Extremity Assessment Upper Extremity Assessment: Generalized weakness   Lower Extremity Assessment Lower Extremity Assessment: Generalized weakness  Communication Communication Communication: HOH   Cognition Arousal/Alertness: Awake/alert Behavior During Therapy: WFL for tasks assessed/performed Overall Cognitive Status: Within Functional Limits for tasks assessed          General Comments: A&O x3, disoriented to situation, baseline dementia    General Comments  Unable to obtain O2 reading despite x3 pulse ox used and every digit attempted. Pt on 3L [place] in  room - 2L [place] at baseline     Exercises Exercises: Other exercises Other Exercises Other Exercises: Pt and family educated re: OT role, DME recs, d/c recs, falls prevention, ECS Other Exercises: Toileting, LBD, UBD, sup<>sit, sit<>stand, sitting/standing balance/tolerance, ~20 ft mobility   Shoulder Instructions      Home Living Family/patient expects to be discharged to:: Private residence Living Arrangements: Alone Available Help at Discharge: Family;Friend(s);Available 24 hours/day (neice living in home - expresses burnout ) Type of Home: House Home Access: Stairs to enter [employer] of Steps: 4 Entrance Stairs-Rails: Can reach both;Left;Right Home Layout: One level     Bathroom Shower/Tub: Tub/shower unit;Curtain         Home Equipment: Environmental consultant - 2 wheels;Cane - quad;Cane - single point;Bedside commode   Additional Comments: Neice reports hoarder like house c no room for RW. Mod I with SPC; furniture walker; in past week: needed help with dressing, bed moblity (due to pain); formerly was showering. 2L o2 baseline - pt reports limited use      Prior Functioning/Environment Level of Independence: Independent with assistive device(s)        Comments: 2 falls in past 6 months; was using LUE SPC at DC        OT Problem List: Decreased strength;Decreased activity tolerance;Impaired balance (sitting and/or standing);Decreased safety awareness;Cardiopulmonary status limiting activity      OT Treatment/Interventions: Self-care/ADL training;Therapeutic exercise;Energy conservation;DME and/or AE instruction;Therapeutic activities;Patient/family education;Balance training    OT Goals(Current goals can be found in the care plan section) Acute Rehab OT Goals Patient Stated Goal: to return to PLOF OT Goal Formulation: With patient/family Time For Goal Achievement: 06/18/20 Potential to Achieve Goals: Good  OT Frequency: Min 1X/week   Barriers to D/C: Inaccessible  home environment;Decreased caregiver support             AM-PAC OT "6 Clicks" Daily Activity     Outcome Measure Help from another person eating meals?: None Help from another person taking care of personal grooming?: A Little Help from another person toileting, which includes using toliet, bedpan, or urinal?: A Lot Help from another person bathing (including washing, rinsing, drying)?: A Lot Help from another person to put on and taking off regular upper body clothing?: A Little Help from another person to put on and taking off regular lower body clothing?: A Lot 6 Click Score: 16   End of Session Equipment Utilized During Treatment: Rolling walker;Oxygen Nurse Communication: Mobility status  Activity Tolerance: Patient tolerated treatment well Patient left: in bed;with call bell/phone within reach;with bed alarm set;with nursing/sitter in room;with family/visitor present (pt left room c transport for [place] )  OT Visit Diagnosis: Other abnormalities of gait and mobility (R26.89);Muscle weakness (generalized) (M62.81)                Time: 4627-0350 OT Time Calculation (min): 45 min Charges:  OT General Charges $OT Visit: 1 Visit OT Evaluation $OT Eval Moderate Complexity: 1 Mod OT Treatments $Self Care/Home Management : 23-37 mins $Therapeutic Activity: 8-22 mins  Dessie Coma, M.S. OTR/L  06/04/20, 11:09 AM  ascom  [phone]  

## 2020-06-04 NOTE — Procedures (Signed)
PROCEDURE SUMMARY:  Successful image-guided left thoracentesis. Yielded 600 milliliters of clear yeloow fluid. Patient tolerated procedure well. EBL: Zero No immediate complications.  Specimen was sent for labs. Post procedure CXR shows no pneumothorax.  Please see imaging section of Epic for full dictation.  Joaquim Nam PA-C 06/04/2020 12:12 PM

## 2020-06-04 NOTE — Consult Note (Signed)
ORTHOPAEDIC CONSULTATION  REQUESTING PHYSICIAN: Pahwani, Michell Heinrich, MD  Chief Complaint:   Status post right thumb and little finger injuries.  History of Present Illness: Mariah [name] is a 74 y.o. female with multiple medical problems including hypertension, hyperlipidemia, gastroesophageal reflux disease, COPD, and a AAA who had recently been admitted to [facility] for treatment of a right thigh hematoma resulting from a fall, as well as for weakness and shortness of breath.  The patient was discharged home, but has had difficulty managing at home, so she was readmitted 2 days ago.  Apparently the patient fell about 3 weeks ago injuring her right hand.  X-rays from 2 weeks ago demonstrated a nondisplaced extra-articular fracture through the base of the right thumb distal phalanx as well as a minimally displaced fracture with intra-articular extension involving the base of the distal phalanx of the right little finger.  The right little finger fracture was splinted.  The patient notes minimal discomfort in the little finger but otherwise feels that she has been getting along well.  She does note that the finger splint is "kind of dirty" and would like to have it changed.  She denies any numbness or tingling to her fingers.  Past Medical History:  Diagnosis Date  . Abdominal aortic aneurysm (AAA) ([place])   . Arthritis   . COPD (chronic obstructive pulmonary disease) ([place])   . GERD (gastroesophageal reflux disease)   . Headache    sinus  . History of adenomatous polyp of colon   . Hyperlipidemia   . Hypertension   . Psoriasis   . Right lumbar radiculitis   . Vitamin D deficiency    Past Surgical History:  Procedure Laterality Date  . APPENDECTOMY  2000  . BREAST BIOPSY Right 09/2016   radial scar;COMPLEX SCLEROSING LESION  . BREAST LUMPECTOMY Right 11/24/2016   COMPLEX SCLEROSING LESION  . BREAST LUMPECTOMY WITH NEEDLE LOCALIZATION  Right 11/24/2016   Procedure: BREAST LUMPECTOMY WITH NEEDLE LOCALIZATION;  Surgeon: Leonie Green, MD;  Location: [facility] ORS;  Service: General;  Laterality: Right;  . COLONOSCOPY WITH PROPOFOL N/A 08/10/2016   Procedure: COLONOSCOPY WITH PROPOFOL;  Surgeon: Manya Silvas, MD;  Location: [facility] ENDOSCOPY;  Service: Endoscopy;  Laterality: N/A;  . ESOPHAGOGASTRODUODENOSCOPY (EGD) WITH PROPOFOL N/A 08/10/2016   Procedure: ESOPHAGOGASTRODUODENOSCOPY (EGD) WITH PROPOFOL;  Surgeon: Manya Silvas, MD;  Location: [facility] ENDOSCOPY;  Service: Endoscopy;  Laterality: N/A;  . FOOT SURGERY Bilateral   . JOINT REPLACEMENT Right 01/27/2016   hip  . 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 TOTAL HIP ARTHROPLASTY Right 01/27/2016   Procedure: TOTAL HIP ARTHROPLASTY;  Surgeon: Dereck Leep, MD;  Location: [facility] ORS;  Service: Orthopedics;  Laterality: Right;   Social History   Socioeconomic History  . Marital status: Divorced    Spouse name: Not on file  . Number of children: 0  . Years of education: 63  . Highest education level: Associate degree: occupational, Hotel manager, or vocational program  Occupational History    Comment: retired  Tobacco Use  . Smoking status: Current Every Day Smoker    Packs/day: 1.00    Years: 0.00    Pack years: 0.00  . Smokeless tobacco: Never Used  Vaping Use  . Vaping Use: Former  Substance and Sexual Activity  . Alcohol use: Yes    Comment: occasionally glass of wine  . Drug use: No  . Sexual activity: Not on file  Other Topics Concern  . Not on file  Social History Narrative   Lives self; in [place]; smoke 1ppd; no alcohol. 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    Social Determinants of Health   Financial Resource Strain: Medium Risk  . Difficulty of Paying Living Expenses: Somewhat hard  Food Insecurity: No Food Insecurity  . Worried About Charity fundraiser in the Last Year: Never true  . Ran Out of Food in the Last Year: Never true   Transportation Needs: No Transportation Needs  . Lack of Transportation (Medical): No  . Lack of Transportation (Non-Medical): No  Physical Activity: Inactive  . Days of Exercise per Week: 0 days  . Minutes of Exercise per Session: 0 min  Stress: No Stress Concern Present  . Feeling of Stress : Not at all  Social Connections: Moderately Integrated  . Frequency of Communication with Friends and Family: More than three times a week  . Frequency of Social Gatherings with Friends and Family: Twice a week  . Attends Religious Services: More than 4 times per year  . Active Member of Clubs or Organizations: Yes  . Attends Archivist Meetings: More than 4 times per year  . Marital Status: Divorced   Family History  Problem Relation Age of Onset  . Cancer Mother        kidney  . Arthritis Father   . Heart disease Father   . COPD Sister   . Fibromyalgia Sister   . Osteoarthritis Sister   . Osteoarthritis Maternal Aunt   . Colon cancer Paternal Aunt   . Colon cancer Paternal Uncle   . Colon cancer Paternal Grandmother   . Breast cancer Neg Hx    Allergies  Allergen Reactions  . Clindamycin/Lincomycin     diarrhea  . Amoxicillin Diarrhea   Prior to Admission medications   Medication Sig Start Date End Date Taking? Authorizing Provider  albuterol (VENTOLIN HFA) 108 (90 Base) MCG/ACT inhaler INHALE 1-2 PUFFS INTO THE LUNGS EVERY 6 HOURS AS NEEDED FOR WHEEZING OR SHORTNESS OF BREATH. 05/22/20   Carles Collet M, PA-C  apixaban (ELIQUIS) 5 MG TABS tablet Take 1 tablet (5 mg total) by mouth 2 (two) times daily. Patient not taking: Reported on 05/30/2020 04/23/20   Algernon Huxley, MD  aspirin EC 81 MG tablet Take 81 mg by mouth daily. Swallow whole.    [provider]  betamethasone dipropionate (DIPROLENE) 0.05 % ointment Apply topically daily. 01/01/20   Ralene Bathe, MD  calcipotriene (DOVONOX) 0.005 % cream APPLY TO SKIN ONCE A DAY APPLY DAILY ON HANDS, FEET AND  WRIST X 2 WEEKS, THEN USE 5 DAYS A WEEK 01/01/20   Ralene Bathe, MD  Cholecalciferol (VITAMIN D3) 2000 units TABS Take 2,000 Units by mouth daily.    [provider]  clobetasol (TEMOVATE) 0.05 % external solution APPLY TO AFFECTED AREA TWICE A DAY 12/03/19   Bacigalupo, Dionne Bucy, MD  COSENTYX SENSOREADY, 300 MG, 150 MG/ML SOAJ Inject 2 Syringes into the skin every 28 (twenty-eight) days. 05/31/20   [provider]  donepezil (ARICEPT) 5 MG tablet Take 5 mg by mouth at bedtime.    [provider]  ferrous [id number] C-folic acid (TRINSICON / FOLTRIN) capsule Take 1 capsule by mouth 2 (two) times daily after a meal. 05/28/20   Lorella Nimrod, MD  fluticasone furoate-vilanterol (BREO ELLIPTA) 100-25 MCG/INH AEPB Inhale 1 puff into the lungs daily. 05/22/20   Trinna Post, PA-C  gabapentin (NEURONTIN) 300 MG capsule  TAKE 1 CAPSULE BY MOUTH TWICE A DAY 05/07/20   Bacigalupo, Dionne Bucy, MD  levocetirizine (XYZAL) 5 MG tablet TAKE 1 TABLET BY MOUTH EVERY DAY IN THE EVENING 12/03/19   Bacigalupo, Dionne Bucy, MD  methotrexate (RHEUMATREX) 2.5 MG tablet Take 15 mg by mouth once a week. Patient not taking: Reported on 05/30/2020 05/23/20   [provider]  metoprolol succinate (TOPROL-XL) 50 MG 24 hr tablet TAKE 1 TABLET (50 MG TOTAL) BY MOUTH DAILY. TAKE WITH OR IMMEDIATELY FOLLOWING A MEAL. 03/27/20   Bacigalupo, Dionne Bucy, MD  pantoprazole (PROTONIX) 20 MG tablet TAKE 1 TABLET (20 MG TOTAL) BY MOUTH 2 (TWO) TIMES DAILY BEFORE A MEAL. 12/28/19   Bacigalupo, Dionne Bucy, MD  rOPINIRole (REQUIP) 1 MG tablet TAKE 1 TABLET (1 MG TOTAL) BY MOUTH AT BEDTIME. 12/28/19   Bacigalupo, Dionne Bucy, MD  simvastatin (ZOCOR) 20 MG tablet TAKE 1 TABLET BY MOUTH EVERY DAY 10/01/19   Bacigalupo, Dionne Bucy, MD  traMADol (ULTRAM) 50 MG tablet TAKE 1 TABLET BY MOUTH EVERY 6 HOURS AS NEEDED FOR MODERATE OR SEVERE PAIN 01/01/20   Jerrol Banana., MD   DG Pelvis 1-2 Views  Result Date:  06/02/2020 CLINICAL DATA:  [place], pain EXAM: PELVIS - 1-2 VIEW COMPARISON:  05/26/2020, 04/24/2020 FINDINGS: Single frontal view of the pelvis demonstrates partial visualization of a right hip arthroplasty. No acute displaced fractures. Stable sclerosis of the right side pubic symphysis. Stable spondylosis at the lumbosacral junction. Diffuse vascular calcifications unchanged. IMPRESSION: 1. Stable appearance of the pelvis.  No acute fractures. Electronically Signed   By: Randa Ngo M.D.   On: 06/02/2020 20:48   CT ANGIO CHEST PE W OR WO CONTRAST  Result Date: 06/02/2020 CLINICAL DATA:  Hypoxia, dyspnea EXAM: CT ANGIOGRAPHY CHEST WITH CONTRAST TECHNIQUE: Multidetector CT imaging of the chest was performed using the standard protocol during bolus administration of intravenous contrast. Multiplanar CT image reconstructions and MIPs were obtained to evaluate the vascular anatomy. CONTRAST:  80mL OMNIPAQUE IOHEXOL 350 MG/ML SOLN COMPARISON:  04/24/2020 FINDINGS: Cardiovascular: There is excellent opacification of the pulmonary arterial tree. There is no intraluminal filling defect identified to suggest acute pulmonary embolism. The central pulmonary arteries are of normal caliber. Moderate to severe multi-vessel coronary artery calcification. Global cardiac size is within normal limits. No pericardial effusion. There is extensive atherosclerotic calcification within the thoracic aorta. No aortic aneurysm. Mediastinum/Nodes: Thyroid unremarkable. No pathologic thoracic adenopathy. Esophagus unremarkable Lungs/Pleura: Mild centrilobular emphysema. Complete collapse of the right middle lobe has developed. A central obstructing lesion is not identified, however. There has developed moderate bilateral pleural effusions, left greater than right, with subtotal collapse of the lower lobes bilaterally, left greater than right. Superimposed focal consolidation is seen within the basilar lingula, likely infectious or  inflammatory in the acute setting. No pneumothorax. Upper Abdomen: No acute abnormality. Musculoskeletal: There has developed extensive dependent subcutaneous body wall edema within the posterior thoracoabdominal wall, new since prior examination, nonspecific. This could reflect dependent edema in the setting of anasarca, or by a local inflammatory process such as cellulitis. No acute bone abnormality. Review of the MIP images confirms the above findings. IMPRESSION: No pulmonary embolism. Moderate to severe multi-vessel coronary artery calcification. Interval development of complete right middle lobe collapse and subtotal collapse of the lower lobes bilaterally, likely passive related to new moderate bilateral pleural effusions. Focal pulmonary infiltrate within the basilar lingula, likely infectious in the acute setting. Aspiration could appear similarly. Dependent subcutaneous body wall edema within  the posterior thoracoabdominal wall, new since prior examination. Correlation with clinical examination would be helpful to exclude a local inflammatory process such as cellulitis. Alternatively, this could be seen in the setting of anasarca and resultant dependent body wall edema. Aortic Atherosclerosis (ICD10-I70.0) and Emphysema (ICD10-J43.9). Electronically Signed   By: Fidela Salisbury MD   On: 06/02/2020 23:33   DG Chest Port 1 View  Result Date: 06/04/2020 CLINICAL DATA:  74 year old female with a history of thoracentesis EXAM: PORTABLE CHEST 1 VIEW COMPARISON:  CT 06/02/2020 FINDINGS: Cardiomediastinal silhouette within normal limits. No evidence of interlobular septal thickening. Coarsened interstitial markings throughout the lungs. Blunting of the bilateral costophrenic angles compatible with small bilateral pleural effusions. No pneumothorax. No acute displaced fracture. IMPRESSION: No complicating features status post left thoracentesis. Electronically Signed   By: Corrie Mckusick D.O.   On: 06/04/2020  12:16   US THORACENTESIS ASP PLEURAL SPACE W/IMG GUIDE  Result Date: 06/04/2020 INDICATION: Patient with history of COPD, acute on chronic hypoxic respiratory failure, new onset bilateral pleural effusions. Request to IR for diagnostic and therapeutic thoracentesis. EXAM: ULTRASOUND GUIDED LEFT THORACENTESIS MEDICATIONS: 10 mL 1% lidocaine COMPLICATIONS: None immediate. PROCEDURE: An ultrasound guided thoracentesis was thoroughly discussed with the patient and questions answered. The benefits, risks, alternatives and complications were also discussed. The patient understands and wishes to proceed with the procedure. Written consent was obtained. Ultrasound was performed to localize and mark an adequate pocket of fluid in the left chest. The area was then prepped and draped in the normal sterile fashion. 1% Lidocaine was used for local anesthesia. Under ultrasound guidance a 6 Fr Safe-T-Centesis catheter was introduced. Thoracentesis was performed. The catheter was removed and a dressing applied. FINDINGS: A total of approximately 600 mL of clear yellow fluid was removed. Samples were sent to the laboratory as requested by the clinical team. IMPRESSION: Successful ultrasound guided left thoracentesis yielding 600 mL of pleural fluid. Read by Candiss Norse, PA-C Electronically Signed   By: Markus Daft M.D.   On: 06/04/2020 12:13    Positive ROS: All other systems have been reviewed and were otherwise negative with the exception of those mentioned in the HPI and as above.  Physical Exam: General:  Alert, no acute distress Psychiatric:  Patient is competent for consent with normal mood and affect   Cardiovascular:  No pedal edema Respiratory:  No wheezing, non-labored breathing GI:  Abdomen is soft and non-tender Skin:  No lesions in the area of chief complaint Neurologic:  Sensation intact distally Lymphatic:  No axillary or cervical lymphadenopathy  Orthopedic Exam:  Orthopedic examination is  limited to the right hand.  Skin inspection of the right hand is notable for a digital splint applied to the right little finger, but otherwise is unremarkable.  After the splint is removed, no swelling, erythema, ecchymosis, abrasions, or other skin abnormalities are identified.  She has mild tenderness to palpation in the area of the DIP joint of the right little finger.  Motion at this joint is limited, but she does exhibit some limited flexion and extension of this joint, as well as of the PIP and MCP joints.  Alignment of the fifth finger is satisfactory.  The right thumb demonstrates minimal tenderness to firm palpation in the area of the IP joint, but otherwise is nontender.  She is able to actively flex and extend the MCP and IP joints without difficulty or triggering.  The finger is well aligned.  She is neurovascularly intact to all digits.  X-rays:  Recent x-rays of the right hand are available for review and have been reviewed by myself.  These films demonstrate an essentially nondisplaced extra-articular fracture involving the base of the distal phalanx of the right thumb.  No significant degenerative changes of the IP joint are noted.  These films also demonstrate a mildly displaced fracture of the base of the distal phalanx of the right little finger with intra-articular extension.  Again, no significant degenerative changes are noted, and the intra-articular portion is nondisplaced.  No other bony abnormalities or lytic lesions are identified.  Assessment: 1.  Nondisplaced extra-articular fracture of distal phalanx of right thumb. 2.  Minimally displaced intra-articular fracture of distal phalanx of right little finger.  Plan: The treatment options have been discussed with the patient.  At this point, the patient's thumb examination is essentially unremarkable.  I do not think that any formal splinting needs to be performed at this time for her thumb.  The patient still has mild discomfort  with the right little finger.  Therefore, the digital splint is reapplied to the dorsal surface of the finger, maintaining the PIP and DIP joints in extension.    The patient may weight-bear as tolerated through her right hand when using a walker or other assistive device.  Thank you for asked me to participate in the care of this delightful woman.  Please arrange for her to follow-up in our office with either Cameron Proud, PA-C, or myself in 2 to 3 weeks for re-evaluation.   Pascal Lux, MD  Beeper #:  [phone]  06/04/2020 5:38 PM

## 2020-06-04 NOTE — Progress Notes (Signed)
Initial Nutrition Assessment  DOCUMENTATION CODES:   Severe malnutrition in context of chronic illness  INTERVENTION:   Ensure Enlive po BID, each supplement provides 350 kcal and 20 grams of protein  MVI daily   Vitamin C 276m po BID  Liberalize diet   NUTRITION DIAGNOSIS:   Severe Malnutrition related to chronic illness (COPD, CHF) as evidenced by severe fat depletion, severe muscle depletion.  GOAL:   Patient will meet greater than or equal to 90% of their needs  MONITOR:   PO intake, Supplement acceptance, Labs, Weight trends, Skin, I & O's  REASON FOR ASSESSMENT:   Consult Assessment of nutrition requirement/status  ASSESSMENT:   74year old female with past medical history of DVT, A. fib-on Eliquis, hypertension, hyperlipidemia, COPD, GERD, AAA, psoriasis, CKD stage III, GERD, tobacco abuse presents to emergency department with generalized weakness and shortness of breath.   Pt s/p thoracentesis today with 6089moutput   Met with pt in room today. Pt reports fair appetite and oral intake at baseline and in hospital; pt eating 50% of her meals in hospital. Pt reports that she does drink chocolate Ensure sometimes at home. Pt reports that she does not take any vitamins at home except for vitamin D. Pt noted to have widespread ecchymosis. Pt is at high risk for scurvy r/t her h/o tobacco use and malnutrition; will start vitamin C supplementation. RD will add supplements and MVI to help pt meet her estimated needs. RD will also liberalize the heart healthy portion of pt's diet as this is restrictive of protein. Per chart, pt appears weight stable pta.    Medications reviewed and include: aspirin, ferrous-b12-vitamin, lasix, solu-medrol, nicotine, protonix, azithromycin, ceftriaxone   Labs reviewed: K 3.4(L), creat 1.02(H), Mg 1.8 wnl P 4.2 wnl- 11/15 Wbc- 3.3(L), Hct 10.2(L), Hct 31.5(L) Folate- 5.8(L), B12 186- 11/15  NUTRITION - FOCUSED PHYSICAL EXAM:    Most  Recent Value  Orbital Region Severe depletion  Upper Arm Region Severe depletion  Thoracic and Lumbar Region Severe depletion  Buccal Region Severe depletion  Temple Region Severe depletion  Clavicle Bone Region Severe depletion  Clavicle and Acromion Bone Region Severe depletion  Scapular Bone Region Severe depletion  Dorsal Hand Severe depletion  Patellar Region Severe depletion  Anterior Thigh Region Severe depletion  Posterior Calf Region Severe depletion  Edema (RD Assessment) None  Hair Reviewed  Eyes Reviewed  Mouth Reviewed  Skin Reviewed  Nails Reviewed     Diet Order:   Diet Order            Diet 2 gram sodium Room service appropriate? Yes; Fluid consistency: Thin  Diet effective now                EDUCATION NEEDS:   Education needs have been addressed  Skin:  Skin Assessment: Reviewed RN Assessment (R thigh hematoma)  Last BM:  11/15- type 6  Height:   Ht Readings from Last 1 Encounters:  06/02/20 5' 1"  (1.549 m)    Weight:   Wt Readings from Last 1 Encounters:  06/04/20 60.2 kg    Ideal Body Weight:  47.7 kg  BMI:  Body mass index is 25.08 kg/m.  Estimated Nutritional Needs:   Kcal:  1300-1500kcal/day  Protein:  65-75g/day  Fluid:  1.2-1.4L/day  CaKoleen DistanceS, RD, LDN Please refer to [facility] and/or RD on-call/weekend/after hours pager

## 2020-06-04 NOTE — Consult Note (Signed)
Pulmonary Medicine          Date: 06/04/2020,   MRN# [id number] Mariah [name] 22-Sep-1945     AdmissionWeight: 58.4 kg                 CurrentWeight: 60.2 kg   Referring physician: Dr. Doristine Bosworth   CHIEF COMPLAINT:   Bilateral pleural effusions   HISTORY OF PRESENT ILLNESS   This is a pleasant 74 year old female with a history of essential hypertension, AAA, DVT, COPD, GERD, CKD, osteoarthritis, secondary erythrocytosis, restless leg syndrome recurrent falls, accessory spleen, low vitamin D chronically, tobacco abuse lifelong history of smoking, atrial fibrillation, early dementia, who recently was discharged from the hospital post mechanical fall with a right thigh hematoma.  She also uses oxygen at home and was discharged on 2 L however states that she has had to increase this to 3 to 4 L/min nasal cannula supplementary oxygen.  She was on Eliquis previously however due to extensive bruising stop to this.  She is chronically anemic and had anemia less than 9 hemoglobin.  She had a CT chest done which shows bilateral pleural effusions and pulmonary consultation was placed for further evaluation management.  Mariah [name] (niece) is present during my evaluation. She states that patient will be moving in with her and she's concerned that it will be hard for patient to move.     PAST MEDICAL HISTORY   Past Medical History:  Diagnosis Date  . Abdominal aortic aneurysm (AAA) ([place])   . Arthritis   . COPD (chronic obstructive pulmonary disease) ([place])   . GERD (gastroesophageal reflux disease)   . Headache    sinus  . History of adenomatous polyp of colon   . Hyperlipidemia   . Hypertension   . Psoriasis   . Right lumbar radiculitis   . Vitamin D deficiency      SURGICAL HISTORY   Past Surgical History:  Procedure Laterality Date  . APPENDECTOMY  2000  . BREAST BIOPSY Right 09/2016   radial scar;COMPLEX SCLEROSING LESION  . BREAST LUMPECTOMY Right 11/24/2016   COMPLEX  SCLEROSING LESION  . BREAST LUMPECTOMY WITH NEEDLE LOCALIZATION Right 11/24/2016   Procedure: BREAST LUMPECTOMY WITH NEEDLE LOCALIZATION;  Surgeon: Leonie Green, MD;  Location: [facility] ORS;  Service: General;  Laterality: Right;  . COLONOSCOPY WITH PROPOFOL N/A 08/10/2016   Procedure: COLONOSCOPY WITH PROPOFOL;  Surgeon: Manya Silvas, MD;  Location: [facility] ENDOSCOPY;  Service: Endoscopy;  Laterality: N/A;  . ESOPHAGOGASTRODUODENOSCOPY (EGD) WITH PROPOFOL N/A 08/10/2016   Procedure: ESOPHAGOGASTRODUODENOSCOPY (EGD) WITH PROPOFOL;  Surgeon: Manya Silvas, MD;  Location: [facility] ENDOSCOPY;  Service: Endoscopy;  Laterality: N/A;  . FOOT SURGERY Bilateral   . JOINT REPLACEMENT Right 01/27/2016   hip  . 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 TOTAL HIP ARTHROPLASTY Right 01/27/2016   Procedure: TOTAL HIP ARTHROPLASTY;  Surgeon: Dereck Leep, MD;  Location: [facility] ORS;  Service: Orthopedics;  Laterality: Right;     FAMILY HISTORY   Family History  Problem Relation Age of Onset  . Cancer Mother        kidney  . Arthritis Father   . Heart disease Father   . COPD Sister   . Fibromyalgia Sister   . Osteoarthritis Sister   . Osteoarthritis Maternal Aunt   . Colon cancer Paternal Aunt   . Colon cancer Paternal Uncle   . Colon cancer Paternal Grandmother   . Breast cancer Neg Hx  SOCIAL HISTORY   Social History   Tobacco Use  . Smoking status: Current Every Day Smoker    Packs/day: 1.00    Years: 0.00    Pack years: 0.00  . Smokeless tobacco: Never Used  Vaping Use  . Vaping Use: Former  Substance Use Topics  . Alcohol use: Yes    Comment: occasionally glass of wine  . Drug use: No     MEDICATIONS    Home Medication:    Current Medication:  Current Facility-Administered Medications:  .  0.9 %  sodium chloride infusion (Manually program via Guardrails IV Fluids), , Intravenous, Once, Doutova, Anastassia, MD .  0.9 %  sodium chloride infusion, 250 mL, Intravenous, PRN,  Doutova, Anastassia, MD .  acetaminophen (TYLENOL) tablet 650 mg, 650 mg, Oral, Q6H PRN **OR** acetaminophen (TYLENOL) suppository 650 mg, 650 mg, Rectal, Q6H PRN, Doutova, Anastassia, MD .  albuterol (PROVENTIL) (2.5 MG/3ML) 0.083% nebulizer solution 2.5 mg, 2.5 mg, Nebulization, Q2H PRN, Doutova, Anastassia, MD .  azithromycin (ZITHROMAX) 500 mg in sodium chloride 0.9 % 250 mL IVPB, 500 mg, Intravenous, Q24H, Doutova, Anastassia, MD, Last Rate: 250 mL/hr at 06/04/20 0151, 500 mg at 06/04/20 0151 .  cefTRIAXone (ROCEPHIN) 1 g in sodium chloride 0.9 % 100 mL IVPB, 1 g, Intravenous, Q24H, Pahwani, Rinka R, MD, Last Rate: 200 mL/hr at 06/03/20 2153, 1 g at 06/03/20 2153 .  guaiFENesin (MUCINEX) 12 hr tablet 600 mg, 600 mg, Oral, BID, Doutova, Anastassia, MD, 600 mg at 06/03/20 2148 .  triamcinolone cream (KENALOG) 0.1 %, , Topical, BID, Given at 06/03/20 2148 **AND** hydrocerin (EUCERIN) cream, , Topical, BID, Toy Baker, MD, Given at 06/03/20 2148 .  HYDROcodone-acetaminophen (NORCO/VICODIN) 5-325 MG per tablet 1-2 tablet, 1-2 tablet, Oral, Q4H PRN, Toy Baker, MD, 2 tablet at 06/04/20 0154 .  potassium chloride (KLOR-CON) packet 40 mEq, 40 mEq, Oral, Once, Pahwani, Rinka R, MD .  rOPINIRole (REQUIP) tablet 1 mg, 1 mg, Oral, QHS, Vladimir Crofts, MD, 1 mg at 06/03/20 2148 .  sodium chloride flush (NS) 0.9 % injection 3 mL, 3 mL, Intravenous, Q12H, Doutova, Anastassia, MD, 3 mL at 06/03/20 2149 .  sodium chloride flush (NS) 0.9 % injection 3 mL, 3 mL, Intravenous, PRN, Doutova, Anastassia, MD    ALLERGIES   Clindamycin/lincomycin and Amoxicillin     REVIEW OF SYSTEMS    Review of Systems:  Gen:  Denies  fever, sweats, chills weigh loss  HEENT: Denies blurred vision, double vision, ear pain, eye pain, hearing loss, nose bleeds, sore throat Cardiac:  No dizziness, chest pain or heaviness, chest tightness,edema Resp:   Denies cough or sputum porduction, shortness of  breath,wheezing, hemoptysis,  Gi: Denies swallowing difficulty, stomach pain, nausea or vomiting, diarrhea, constipation, bowel incontinence Gu:  Denies bladder incontinence, burning urine Ext:   Denies Joint pain, stiffness or swelling Skin: Denies  skin rash, easy bruising or bleeding or hives Endoc:  Denies polyuria, polydipsia , polyphagia or weight change Psych:   Denies depression, insomnia or hallucinations   Other:  All other systems negative   VS: BP (!) 142/80 (BP Location: Left Arm)   Pulse 81   Temp 97.6 F (36.4 C) (Oral)   Resp 13   Ht 5\' 1"  (1.549 m)   Wt 60.2 kg   SpO2 95%   BMI 25.08 kg/m      PHYSICAL EXAM    GENERAL:NAD, no fevers, chills, no weakness no fatigue HEAD: Normocephalic, atraumatic.  EYES: Pupils equal, round, reactive to  light. Extraocular muscles intact. No scleral icterus.  MOUTH: Moist mucosal membrane. Dentition intact. No abscess noted.  EAR, NOSE, THROAT: Clear without exudates. No external lesions.  NECK: Supple. No thyromegaly. No nodules. No JVD.  PULMONARY: patient with decreased air entry bilaterally  CARDIOVASCULAR: S1 and S2. Regular rate and rhythm. No murmurs, rubs, or gallops. No edema. Pedal pulses 2+ bilaterally.  GASTROINTESTINAL: Soft, nontender, nondistended. No masses. Positive bowel sounds. No hepatosplenomegaly.  MUSCULOSKELETAL: No swelling, clubbing, or edema. Range of motion full in all extremities.  NEUROLOGIC: Cranial nerves II through XII are intact. No gross focal neurological deficits. Sensation intact. Reflexes intact.  SKIN: No ulceration, lesions, rashes, or cyanosis. Skin warm and dry. Turgor intact.  PSYCHIATRIC: Mood, affect within normal limits. The patient is awake, alert and oriented x 3. Insight, judgment intact.       IMAGING    DG Chest 2 View  Result Date: 06/02/2020 CLINICAL DATA:  Weakness, shortness of breath EXAM: CHEST - 2 VIEW COMPARISON:  05/26/2020 FINDINGS: Frontal and lateral views  of the chest demonstrate a stable cardiac silhouette. Decreased left pleural effusion and left basilar consolidation. New right pleural effusion, obscuring the posterior costophrenic angle and seen along the major fissure. Right lower lobe consolidation is noted. No pneumothorax. No acute bony abnormalities. IMPRESSION: 1. Bibasilar consolidation, favor atelectasis. 2. Bilateral pleural effusions. Electronically Signed   By: Randa Ngo M.D.   On: 06/02/2020 16:49   DG Chest 2 View  Result Date: 05/26/2020 CLINICAL DATA:  Shortness of breath. EXAM: CHEST - 2 VIEW COMPARISON:  CT chest/abdomen/pelvis 04/24/2020. Chest radiographs 04/09/2020. FINDINGS: Heart size at the upper limits of normal. Aortic atherosclerosis. New from the prior examination of 04/24/2020, there is a small to moderate left pleural effusion with associated left basilar atelectasis and/or consolidation. The right lung is clear. No evidence of pneumothorax. No acute bony abnormality identified. IMPRESSION: Small to moderate left pleural effusion with associated left basilar atelectasis and/or consolidation, new as compared to the examination of 04/24/2020. Aortic Atherosclerosis (ICD10-I70.0). Electronically Signed   By: Kellie Simmering DO   On: 05/26/2020 06:33   DG Pelvis 1-2 Views  Result Date: 06/02/2020 CLINICAL DATA:  [place], pain EXAM: PELVIS - 1-2 VIEW COMPARISON:  05/26/2020, 04/24/2020 FINDINGS: Single frontal view of the pelvis demonstrates partial visualization of a right hip arthroplasty. No acute displaced fractures. Stable sclerosis of the right side pubic symphysis. Stable spondylosis at the lumbosacral junction. Diffuse vascular calcifications unchanged. IMPRESSION: 1. Stable appearance of the pelvis.  No acute fractures. Electronically Signed   By: Randa Ngo M.D.   On: 06/02/2020 20:48   CT Head Wo Contrast  Result Date: 05/19/2020 CLINICAL DATA:  Fall trauma. Patient tripped in the parking lot. Hit head and has  laceration above by bra. EXAM: CT HEAD WITHOUT CONTRAST CT CERVICAL SPINE WITHOUT CONTRAST TECHNIQUE: Multidetector CT imaging of the head and cervical spine was performed following the standard protocol without intravenous contrast. Multiplanar CT image reconstructions of the cervical spine were also generated. COMPARISON:  None. FINDINGS: CT HEAD FINDINGS Brain: There is central and cortical atrophy. Periventricular white matter changes are consistent with small vessel disease. There is no intra or extra-axial fluid collection or mass lesion. The basilar cisterns and ventricles have a normal appearance. There is no CT evidence for acute infarction or hemorrhage. Vascular: . No hyperdense vessels. There is atherosclerotic calcification of the internal carotid arteries Skull: Normal. Negative for fracture or focal lesion. Sinuses/Orbits: No acute finding. Other:  There is LEFT frontal scalp edema in the supraorbital region. No associated underlying fracture. CT CERVICAL SPINE FINDINGS Alignment: Normal alignment. Skull base and vertebrae: No acute fracture. No primary bone lesion or focal pathologic process. Soft tissues and spinal canal: No prevertebral fluid or swelling. No visible canal hematoma. Disc levels: Moderate disc height loss notably at C3-4, C4-5, C5-6, and C6-7. Upper chest: Negative. Other: None IMPRESSION: 1. No evidence for acute intracranial abnormality. 2. Atrophy and small vessel disease. 3. LEFT frontal scalp edema in the supraorbital region. 4. No evidence for acute cervical spine abnormality. 5. Moderate mid cervical degenerative changes. Electronically Signed   By: Nolon Nations M.D.   On: 05/19/2020 18:53   CT ANGIO CHEST PE W OR WO CONTRAST  Result Date: 06/02/2020 CLINICAL DATA:  Hypoxia, dyspnea EXAM: CT ANGIOGRAPHY CHEST WITH CONTRAST TECHNIQUE: Multidetector CT imaging of the chest was performed using the standard protocol during bolus administration of intravenous contrast.  Multiplanar CT image reconstructions and MIPs were obtained to evaluate the vascular anatomy. CONTRAST:  58mL OMNIPAQUE IOHEXOL 350 MG/ML SOLN COMPARISON:  04/24/2020 FINDINGS: Cardiovascular: There is excellent opacification of the pulmonary arterial tree. There is no intraluminal filling defect identified to suggest acute pulmonary embolism. The central pulmonary arteries are of normal caliber. Moderate to severe multi-vessel coronary artery calcification. Global cardiac size is within normal limits. No pericardial effusion. There is extensive atherosclerotic calcification within the thoracic aorta. No aortic aneurysm. Mediastinum/Nodes: Thyroid unremarkable. No pathologic thoracic adenopathy. Esophagus unremarkable Lungs/Pleura: Mild centrilobular emphysema. Complete collapse of the right middle lobe has developed. A central obstructing lesion is not identified, however. There has developed moderate bilateral pleural effusions, left greater than right, with subtotal collapse of the lower lobes bilaterally, left greater than right. Superimposed focal consolidation is seen within the basilar lingula, likely infectious or inflammatory in the acute setting. No pneumothorax. Upper Abdomen: No acute abnormality. Musculoskeletal: There has developed extensive dependent subcutaneous body wall edema within the posterior thoracoabdominal wall, new since prior examination, nonspecific. This could reflect dependent edema in the setting of anasarca, or by a local inflammatory process such as cellulitis. No acute bone abnormality. Review of the MIP images confirms the above findings. IMPRESSION: No pulmonary embolism. Moderate to severe multi-vessel coronary artery calcification. Interval development of complete right middle lobe collapse and subtotal collapse of the lower lobes bilaterally, likely passive related to new moderate bilateral pleural effusions. Focal pulmonary infiltrate within the basilar lingula, likely  infectious in the acute setting. Aspiration could appear similarly. Dependent subcutaneous body wall edema within the posterior thoracoabdominal wall, new since prior examination. Correlation with clinical examination would be helpful to exclude a local inflammatory process such as cellulitis. Alternatively, this could be seen in the setting of anasarca and resultant dependent body wall edema. Aortic Atherosclerosis (ICD10-I70.0) and Emphysema (ICD10-J43.9). Electronically Signed   By: Fidela Salisbury MD   On: 06/02/2020 23:33   CT Cervical Spine Wo Contrast  Result Date: 05/19/2020 CLINICAL DATA:  Fall trauma. Patient tripped in the parking lot. Hit head and has laceration above by bra. EXAM: CT HEAD WITHOUT CONTRAST CT CERVICAL SPINE WITHOUT CONTRAST TECHNIQUE: Multidetector CT imaging of the head and cervical spine was performed following the standard protocol without intravenous contrast. Multiplanar CT image reconstructions of the cervical spine were also generated. COMPARISON:  None. FINDINGS: CT HEAD FINDINGS Brain: There is central and cortical atrophy. Periventricular white matter changes are consistent with small vessel disease. There is no intra or extra-axial fluid collection or  mass lesion. The basilar cisterns and ventricles have a normal appearance. There is no CT evidence for acute infarction or hemorrhage. Vascular: . No hyperdense vessels. There is atherosclerotic calcification of the internal carotid arteries Skull: Normal. Negative for fracture or focal lesion. Sinuses/Orbits: No acute finding. Other: There is LEFT frontal scalp edema in the supraorbital region. No associated underlying fracture. CT CERVICAL SPINE FINDINGS Alignment: Normal alignment. Skull base and vertebrae: No acute fracture. No primary bone lesion or focal pathologic process. Soft tissues and spinal canal: No prevertebral fluid or swelling. No visible canal hematoma. Disc levels: Moderate disc height loss notably at C3-4,  C4-5, C5-6, and C6-7. Upper chest: Negative. Other: None IMPRESSION: 1. No evidence for acute intracranial abnormality. 2. Atrophy and small vessel disease. 3. LEFT frontal scalp edema in the supraorbital region. 4. No evidence for acute cervical spine abnormality. 5. Moderate mid cervical degenerative changes. Electronically Signed   By: Nolon Nations M.D.   On: 05/19/2020 18:53   CT FEMUR RIGHT WO CONTRAST  Result Date: 05/26/2020 CLINICAL DATA:  Right thigh hematoma. EXAM: CT OF THE LOWER RIGHT EXTREMITY WITHOUT CONTRAST TECHNIQUE: Multidetector CT imaging of the right lower extremity was performed according to the standard protocol. COMPARISON:  CT chest, abdomen, and pelvis dated April 24, 2020. FINDINGS: Bones/Joint/Cartilage No acute fracture or dislocation. Subacute, healing fractures of right pubic bone and right inferior pubic ramus with associated sclerosis. Prior right total hip arthroplasty. No evidence of hardware failure or loosening. No joint effusion. Mild degenerative changes of the right knee. Osteopenia Ligaments Ligaments are suboptimally evaluated by CT. Muscles and Tendons Grossly intact. Soft tissue Somewhat ill-defined mixed density superficial fluid collection in the right lateral hip overlying the greater trochanter, measuring approximately 6.5 x 5.3 x 13.9 cm. Extensive circumferential soft tissue swelling of the right thigh. Partially visualized prominent soft tissue edema in the medial left thigh. No soft tissue mass. IMPRESSION: 1. Superficial 6.5 x 5.3 x 13.9 cm hematoma in the right lateral hip overlying the greater trochanter. 2. No acute osseous abnormality. Subacute, healing fractures of the right pubic bone and right inferior pubic ramus. 3. Prior right total hip arthroplasty without evidence of hardware complication. Electronically Signed   By: Titus Dubin M.D.   On: 05/26/2020 10:39   DG Hand Complete Right  Result Date: 05/24/2020 CLINICAL DATA:  Pain and  bruising in the right hand involving the thumb, ring finger and little finger following a fall 4 days ago. EXAM: RIGHT HAND - COMPLETE 3+ VIEW COMPARISON:  None. FINDINGS: Nondisplaced fracture across the proximal aspect of the 1st distal phalanx, without intra-articular involvement, displacement or angulation. Mildly impacted fracture of the proximal aspect of the 5th distal phalanx with mild ventral displacement of the distal fragment. This has intra-articular extension. No additional fractures are seen and no dislocations are demonstrated. Moderate 1st IP joint degenerative changes. Minimal 1st MCP joint degenerative changes. IMPRESSION: Fractures of the 1st and 5th distal phalanges, as described above. These results will be called to the ordering clinician or representative by the Radiologist Assistant, and communication documented in the PACS or [employer]. Electronically Signed   By: Claudie Revering M.D.   On: 05/24/2020 15:36   ECHOCARDIOGRAM COMPLETE  Result Date: 05/28/2020    ECHOCARDIOGRAM REPORT   Patient Name:   TIJAH HANE Date of Exam: 05/27/2020 Medical Rec #:  [id number]     Height:       61.0 in Accession #:    [id number]  Weight:       132.3 lb Date of Birth:  09/09/1945      BSA:          1.584 m Patient Age:    58 years      BP:           136/60 mmHg Patient Gender: F             HR:           102 bpm. Exam Location:  [facility] Procedure: 2D Echo, Cardiac Doppler and Color Doppler Indications:     CHF-acute diastolic 132.44  History:         Patient has no prior history of Echocardiogram examinations.                  Stroke; Signs/Symptoms:Syncope.  Sonographer:     Sherrie Sport RDCS (AE) Referring Phys:  [id number] [facility] AMIN Diagnosing Phys: Nelva Bush MD IMPRESSIONS  1. Left ventricular ejection fraction, by estimation, is 60 to 65%. The left ventricle has normal function. The left ventricle has no regional wall motion abnormalities. There is mild left ventricular hypertrophy. Left  ventricular diastolic parameters are consistent with Grade I diastolic dysfunction (impaired relaxation).  2. Right ventricular systolic function is normal. The right ventricular size is normal. There is moderately elevated pulmonary artery systolic pressure.  3. Question left pleural effusion. Consider dedicated chest imaging for further evaluation.  4. The mitral valve is degenerative. Mild mitral valve regurgitation.  5. Tricuspid valve regurgitation is mild to moderate.  6. The aortic valve was not well visualized. Aortic valve regurgitation is mild. Mild to moderate aortic valve sclerosis/calcification is present, without any evidence of aortic stenosis.  7. The inferior vena cava is normal in size with greater than 50% respiratory variability, suggesting right atrial pressure of 3 mmHg. FINDINGS  Left Ventricle: Left ventricular ejection fraction, by estimation, is 60 to 65%. The left ventricle has normal function. The left ventricle has no regional wall motion abnormalities. The left ventricular internal cavity size was normal in size. There is  mild left ventricular hypertrophy. Left ventricular diastolic parameters are consistent with Grade I diastolic dysfunction (impaired relaxation). Right Ventricle: The right ventricular size is normal. No increase in right ventricular wall thickness. Right ventricular systolic function is normal. There is moderately elevated pulmonary artery systolic pressure. The tricuspid regurgitant velocity is 3.50 m/s, and with an assumed right atrial pressure of 3 mmHg, the estimated right ventricular systolic pressure is 36.6 mmHg. Left Atrium: Left atrial size was normal in size. Right Atrium: Right atrial size was normal in size. Pericardium: Question left pleural effusion. Consider dedicated chest imaging for further evaluation. There is no evidence of pericardial effusion. Presence of pericardial fat pad. Mitral Valve: The mitral valve is degenerative in appearance. There is  mild thickening of the mitral valve leaflet(s). There is mild calcification of the mitral valve leaflet(s). Mild mitral valve regurgitation. Tricuspid Valve: The tricuspid valve is normal in structure. Tricuspid valve regurgitation is mild to moderate. Aortic Valve: The aortic valve was not well visualized. Aortic valve regurgitation is mild. Mild to moderate aortic valve sclerosis/calcification is present, without any evidence of aortic stenosis. Aortic valve mean gradient measures 7.5 mmHg. Aortic valve peak gradient measures 15.1 mmHg. Aortic valve area, by VTI measures 1.79 cm. Pulmonic Valve: The pulmonic valve was not well visualized. Pulmonic valve regurgitation is not visualized. No evidence of pulmonic stenosis. Aorta: The aortic root is normal in size and structure.  Pulmonary Artery: The pulmonary artery is of normal size. Venous: The inferior vena cava is normal in size with greater than 50% respiratory variability, suggesting right atrial pressure of 3 mmHg. IAS/Shunts: No atrial level shunt detected by color flow Doppler.  LEFT VENTRICLE PLAX 2D LVIDd:         3.58 cm  Diastology LVIDs:         2.55 cm  LV e' medial:    6.74 cm/s LV PW:         1.27 cm  LV E/e' medial:  12.6 LV IVS:        1.21 cm  LV e' lateral:   6.42 cm/s LVOT diam:     2.00 cm  LV E/e' lateral: 13.2 LV SV:         74 LV SV Index:   46 LVOT Area:     3.14 cm  RIGHT VENTRICLE RV S prime:     11.00 cm/s TAPSE (M-mode): 3.3 cm LEFT ATRIUM             Index       RIGHT ATRIUM           Index LA diam:        3.50 cm 2.21 cm/m  RA Area:     14.30 cm LA Vol (A2C):   53.1 ml 33.51 ml/m RA Volume:   31.60 ml  19.94 ml/m LA Vol (A4C):   41.1 ml 25.94 ml/m LA Biplane Vol: 47.8 ml 30.17 ml/m  AORTIC VALVE                    PULMONIC VALVE AV Area (Vmax):    1.36 cm     PV Vmax:        0.75 m/s AV Area (Vmean):   1.57 cm     PV Peak grad:   2.2 mmHg AV Area (VTI):     1.79 cm     RVOT Peak grad: 3 mmHg AV Vmax:           194.50 cm/s AV  Vmean:          125.500 cm/s AV VTI:            0.410 m AV Peak Grad:      15.1 mmHg AV Mean Grad:      7.5 mmHg LVOT Vmax:         84.20 cm/s LVOT Vmean:        62.800 cm/s LVOT VTI:          0.234 m LVOT/AV VTI ratio: 0.57  AORTA Ao Root diam: 3.10 cm MITRAL VALVE               TRICUSPID VALVE MV Area (PHT): 5.13 cm    TR Peak grad:   49.0 mmHg MV Decel Time: 148 msec    TR Vmax:        350.00 cm/s MV E velocity: 84.80 cm/s MV A velocity: 97.70 cm/s  SHUNTS MV E/A ratio:  0.87        Systemic VTI:  0.23 m                            Systemic Diam: 2.00 cm Nelva Bush MD Electronically signed by Nelva Bush MD Signature Date/Time: 05/28/2020/8:51:55 AM    Final       ASSESSMENT/PLAN   Bilateral pleural effusions      -with  partial fissural effusion unlikely to be of significant clinical impact however she does have significant underlying centrilobular emphysema bilaterally     -There is bilateral effusions worse on the left side which will be graded as moderate     -Patient is scheduled for thoracentesis today with pleural fluid studies in process      -The patient does have a history of diastolic CHF as well as longstanding renal insufficiency however this is mild with GFR only mildly reduced.  Patient is status post transthoracic echo with preserved ejection fraction mild LVH, grade 1 diastolic dysfunction moderately elevated pulmonary artery systolic pressures suggestive of pulmonary hypertension.     -LDH is mildly elevated suggestive of exudate but patient is on lasix which can do that with contraction.      -s/p thoracentesis - will review profile when finalized  Advanced centrilobular emphysema with COPD   Continue current regimen    - generic COPD care path   - pulmonary clinic on outpatient     Chronic hypoxemia  - continue supplemental O2 2L/min    Chronic deconditioning   Continue PT/OT - recommend inpatient rehab post DC      Thank you for allowing me to  participate in the care of this patient.   Patient/Family are satisfied with care plan and all questions have been answered.  This document was prepared using Dragon voice recognition software and may include unintentional dictation errors.     Ottie Glazier, M.D.  Division of [facility]

## 2020-06-04 NOTE — Progress Notes (Signed)
PT Cancellation Note  Patient Details Name: [name] MRN: [id number] DOB: [birth year]   Cancelled Treatment:    Reason Eval/Treat Not Completed: Patient at procedure or test/unavailable (Pt off floor for centesis, history, PLOF collected from niece. Will attempt again at later date/time.)  11:55 AM, 06/04/20 [name], PT, DPT Physical Therapist - [facility]  [phone] ([place])   Izzac Rockett C 06/04/2020, 11:55 AM

## 2020-06-04 NOTE — Progress Notes (Addendum)
PROGRESS NOTE    Mariah [name]  LMB:[id number] DOB: Jul 06, 1946 DOA: 06/02/2020 PCP: [name], MD   Brief Narrative:  Patient is 74 year old female with past medical history of DVT, A. fib-on Eliquis, hypertension, hyperlipidemia, COPD, GERD, AAA, psoriasis, CKD stage III, GERD, tobacco abuse presents to emergency department with generalized weakness and shortness of breath.  ED course: Patient afebrile with no leukocytosis, tachycardic and blood pressure was noted to be on the lower side.  Requiring 2 to 3 L of oxygen via nasal cannula, hemoglobin noted to be 6.8, troponin negative, lactic acid: WNL, magnesium, phosphorus: WNL, BNP: 616, chest x-ray concerning for new large right-sided effusion, some consolidation.  CT angio chest came back negative for acute PE.  Patient admitted for further evaluation and management of symptomatic anemia.   Assessment & Plan:  Acute on chronic hypoxemic respiratory failure: -In the setting of worsening pleural effusion versus pneumonia.  On 2 L at baseline -Currently requiring 3 L of oxygen via nasal cannula, afebrile, no leukocytosis, lactic acid: WNL.  Reviewed chest x-ray and CT chest. -COVID-19 negative, strep pneumo antigen: Pending. -On admission patient was started on Unasyn and azithromycin (concern for aspiration).  She evaluated by speech therapy-no signs of aspiration noted.  Unasyn changed to Rocephin and continued azithromycin. -Consult PCCM-Dr. Raul Del Recommended IR consult for thoracentesis.  He will come and evaluate patient-await further recommendations.  Moderate bilateral pleural effusions: -Reviewed chest x-ray and CTA chest.  Status post IR guided thoracentesis on 11/16 -600 mL of clear yellow fluid removed.  No immediate complications.  No pneumothorax noted in repeat chest x-ray-labs pending.  Appreciate IR help.  Acute on chronic diastolic CHF: -Patient has worsening bilateral pleural effusion.  BNP: 616. -Reviewed  echo from 05/27/2020 which showed ejection fraction of 60 to 65% with grade 1 diastolic dysfunction.  Mild to moderate TR, mild MR -Strict INO's and daily weight.  Monitor electrolytes -Gentle diuresis.  Avoid fluid overload  Symptomatic anemia: -H&H dropped from 7.1-6.3 in 1 day.  Her Eliquis was discontinued on previous admission due to right thigh hematoma. - continue to hold Eliquis.  S/p 2 unit PRBC transfusion.  H&H improved from 6.3-11.9. -Occult blood is pending.  History of recent DVT: -Patient was started on Eliquis however she was asked to hold for 1 week due to hematoma in the right thigh.  We will hold Eliquis for now considering underlying symptomatic anemia  Fracture of first and fifth distal phalanges: -About 3 weeks ago. -Discussed with Dr. Roland Rack via secure chat-reviewed images and gave recommendations regarding weightbearing. -Recommended weightbearing as tolerated.  Appreciate input.  Right thigh hematoma: -Improving.  Hypertension: Blood pressure is stable.  Will resume metoprolol.  Hyperlipidemia: Continue Zocor  GERD: Continue PPI  CKD stage IIIa: -Renal function improved from baseline.  Continue to monitor.  COPD: Not in acute exacerbation.  No wheezing noted on exam. -Continue home inhalers. -Patient started on IV steroids by PCCM  Hypokalemia: Potassium 3.4. -Replenished.  Magnesium level: WNL.  Repeat BMP tomorrow a.m.  Tobacco abuse: Counseled about cessation  Psoriasis: loratidine as needed  Restless leg syndrome: Continue gabapentin/Requip  Debility: -In the setting of chronic medical problems. -Consulted PT/OT recommended SNF  DVT prophylaxis: SCD Code Status: Full code Family Communication:  None present at bedside.  Plan of care discussed with patient in length and she verbalized understanding and agreed with it. Disposition Plan: Pending work-up  Consultants:   PCCM  IR  Procedures:   CT chest  Antimicrobials:   Unasyn Status is: Inpatient  Dispo: The patient is from: Home              Anticipated d/c is to: Home -tenderness to move in with her family              Anticipated d/c date is: More than 2 days              Patient currently not medically stable for the discharge.  Subjective: Patient seen and examined.  Sitting comfortably on the bed.  Tells me that she feels better this morning as compared to yesterday however continues to feel weak.  She denies chest pain, worsening shortness of breath, wheezing, leg swelling, orthopnea, PND, nausea or vomiting.  Objective: Vitals:   06/04/20 0547 06/04/20 0800 06/04/20 1100 06/04/20 1133  BP: (!) 135/91 122/72 (!) 142/80 117/71  Pulse: (!) 59 81    Resp: 18 13    Temp: (!) 97.5 F (36.4 C) 97.6 F (36.4 C)    TempSrc: Oral Oral    SpO2: 100% 96% 95% 92%  Weight:      Height:        Intake/Output Summary (Last 24 hours) at 06/04/2020 1245 Last data filed at 06/04/2020 0958 Gross per 24 hour  Intake 1102 ml  Output 2450 ml  Net -1348 ml   Filed Weights   06/02/20 1358 06/04/20 0545  Weight: 58.4 kg 60.2 kg    Examination:  General exam: Appears calm and comfortable, on 3 L of oxygen via nasal cannula, appears thin and lean and weak Respiratory system: Decreased breath sounds noted on the bases.  No rhonchi or crackles. Cardiovascular system: Systolic murmur noted., RRR. No JVD, rubs, gallops or clicks. No pedal edema. Gastrointestinal system: Abdomen is nondistended, soft and nontender. No organomegaly or masses felt. Normal bowel sounds heard. Central nervous system: Alert and oriented. No focal neurological deficits. Extremities: Symmetric 5 x 5 power. Skin: Multiple scratch marks, bruises noted on arms and legs Psychiatry: Judgement and insight appear normal. Mood & affect appropriate.   Data Reviewed: I have personally reviewed following labs and imaging studies  CBC: Recent Labs  Lab 06/02/20 1348 06/02/20 1747  06/03/20 0501 06/03/20 1905 06/04/20 0412  WBC 4.7 4.8 4.6  --  3.3*  NEUTROABS  --  3.7  --   --   --   HGB 7.1* 6.9* 6.3* 11.9* 10.2*  HCT 22.2* 21.8* 19.9* 35.9* 31.5*  MCV 93.3 93.6 94.3  --  91.8  PLT 163 154 156  --  831   Basic Metabolic Panel: Recent Labs  Lab 06/02/20 1348 06/02/20 1613 06/02/20 1747 06/03/20 1905 06/04/20 0412  NA 136 138  --  138 137  K 4.1 3.9  --  3.7 3.4*  CL 99 101  --  96* 97*  CO2 30 29  --  26 30  GLUCOSE 84 83  --  151* 86  BUN 14 14  --  13 12  CREATININE 1.01* 0.79  --  1.02* 1.02*  CALCIUM 8.3* 8.0*  --  8.2* 7.7*  MG  --   --  1.8 1.9 1.8  PHOS  --   --  3.7 4.2  --    GFR: Estimated Creatinine Clearance: 39.7 mL/min (A) (by C-G formula based on SCr of 1.02 mg/dL (H)). Liver Function Tests: Recent Labs  Lab 06/02/20 1747 06/03/20 1905  AST 24 25  ALT 20 22  ALKPHOS 129* 152*  BILITOT  0.9 1.1  PROT 4.5* 5.4*  ALBUMIN 2.2* 2.7*   No results for input(s): LIPASE, AMYLASE in the last 168 hours. No results for input(s): AMMONIA in the last 168 hours. Coagulation Profile: No results for input(s): INR, PROTIME in the last 168 hours. Cardiac Enzymes: No results for input(s): CKTOTAL, CKMB, CKMBINDEX, TROPONINI in the last 168 hours. BNP (last 3 results) No results for input(s): PROBNP in the last 8760 hours. HbA1C: No results for input(s): HGBA1C in the last 72 hours. CBG: No results for input(s): GLUCAP in the last 168 hours. Lipid Profile: No results for input(s): CHOL, HDL, LDLCALC, TRIG, CHOLHDL, LDLDIRECT in the last 72 hours. Thyroid Function Tests: Recent Labs    06/03/20 1905  TSH 3.167   Anemia Panel: Recent Labs    06/03/20 0501  VITAMINB12 186  FOLATE 5.8*  FERRITIN 35  TIBC 263  IRON 32  RETICCTPCT 4.9*   Sepsis Labs: Recent Labs  Lab 06/02/20 1747  PROCALCITON <0.10  LATICACIDVEN 1.1    Recent Results (from the past 240 hour(s))  Respiratory Panel by RT PCR (Flu A&B, Covid) -  Nasopharyngeal Swab     Status: None   Collection Time: 05/26/20  9:47 AM   Specimen: Nasopharyngeal Swab  Result Value Ref Range Status   SARS Coronavirus 2 by RT PCR NEGATIVE NEGATIVE Final    Comment: (NOTE) SARS-CoV-2 target nucleic acids are NOT DETECTED.  The SARS-CoV-2 RNA is generally detectable in upper respiratoy specimens during the acute phase of infection. The lowest concentration of SARS-CoV-2 viral copies this assay can detect is 131 copies/mL. A negative result does not preclude SARS-Cov-2 infection and should not be used as the sole basis for treatment or other patient management decisions. A negative result may occur with  improper specimen collection/handling, submission of specimen other than nasopharyngeal swab, presence of viral mutation(s) within the areas targeted by this assay, and inadequate number of viral copies (<131 copies/mL). A negative result must be combined with clinical observations, patient history, and epidemiological information. The expected result is Negative.  Fact Sheet for Patients:  PinkCheek.be  Fact Sheet for Healthcare Providers:  GravelBags.it  This test is no t yet approved or cleared by the [place] FDA and  has been authorized for detection and/or diagnosis of SARS-CoV-2 by FDA under an Emergency Use Authorization (EUA). This EUA will remain  in effect (meaning this test can be used) for the duration of the COVID-19 declaration under Section 564(b)(1) of the Act, 21 U.S.C. section 360bbb-3(b)(1), unless the authorization is terminated or revoked sooner.     Influenza A by PCR NEGATIVE NEGATIVE Final   Influenza B by PCR NEGATIVE NEGATIVE Final    Comment: (NOTE) The Xpert Xpress SARS-CoV-2/FLU/RSV assay is intended as an aid in  the diagnosis of influenza from Nasopharyngeal swab specimens and  should not be used as a sole basis for treatment. Nasal washings and   aspirates are unacceptable for Xpert Xpress SARS-CoV-2/FLU/RSV  testing.  Fact Sheet for Patients: PinkCheek.be  Fact Sheet for Healthcare Providers: GravelBags.it  This test is not yet approved or cleared by the [place] FDA and  has been authorized for detection and/or diagnosis of SARS-CoV-2 by  FDA under an Emergency Use Authorization (EUA). This EUA will remain  in effect (meaning this test can be used) for the duration of the  Covid-19 declaration under Section 564(b)(1) of the Act, 21  U.S.C. section 360bbb-3(b)(1), unless the authorization is  terminated or revoked. Performed at  [facility], [address], [place], [place] [address]   Respiratory Panel by RT PCR (Flu A&B, Covid) - Nasopharyngeal Swab     Status: None   Collection Time: 06/02/20 10:01 PM   Specimen: Nasopharyngeal Swab  Result Value Ref Range Status   SARS Coronavirus 2 by RT PCR NEGATIVE NEGATIVE Final    Comment: (NOTE) SARS-CoV-2 target nucleic acids are NOT DETECTED.  The SARS-CoV-2 RNA is generally detectable in upper respiratoy specimens during the acute phase of infection. The lowest concentration of SARS-CoV-2 viral copies this assay can detect is 131 copies/mL. A negative result does not preclude SARS-Cov-2 infection and should not be used as the sole basis for treatment or other patient management decisions. A negative result may occur with  improper specimen collection/handling, submission of specimen other than nasopharyngeal swab, presence of viral mutation(s) within the areas targeted by this assay, and inadequate number of viral copies (<131 copies/mL). A negative result must be combined with clinical observations, patient history, and epidemiological information. The expected result is Negative.  Fact Sheet for Patients:  PinkCheek.be  Fact Sheet for Healthcare Providers:   GravelBags.it  This test is no t yet approved or cleared by the [place] FDA and  has been authorized for detection and/or diagnosis of SARS-CoV-2 by FDA under an Emergency Use Authorization (EUA). This EUA will remain  in effect (meaning this test can be used) for the duration of the COVID-19 declaration under Section 564(b)(1) of the Act, 21 U.S.C. section 360bbb-3(b)(1), unless the authorization is terminated or revoked sooner.     Influenza A by PCR NEGATIVE NEGATIVE Final   Influenza B by PCR NEGATIVE NEGATIVE Final    Comment: (NOTE) The Xpert Xpress SARS-CoV-2/FLU/RSV assay is intended as an aid in  the diagnosis of influenza from Nasopharyngeal swab specimens and  should not be used as a sole basis for treatment. Nasal washings and  aspirates are unacceptable for Xpert Xpress SARS-CoV-2/FLU/RSV  testing.  Fact Sheet for Patients: PinkCheek.be  Fact Sheet for Healthcare Providers: GravelBags.it  This test is not yet approved or cleared by the [place] FDA and  has been authorized for detection and/or diagnosis of SARS-CoV-2 by  FDA under an Emergency Use Authorization (EUA). This EUA will remain  in effect (meaning this test can be used) for the duration of the  Covid-19 declaration under Section 564(b)(1) of the Act, 21  U.S.C. section 360bbb-3(b)(1), unless the authorization is  terminated or revoked. Performed at [facility], [address] [place], [place] [address]       Radiology Studies: DG Chest 2 View  Result Date: 06/02/2020 CLINICAL DATA:  Weakness, shortness of breath EXAM: CHEST - 2 VIEW COMPARISON:  05/26/2020 FINDINGS: Frontal and lateral views of the chest demonstrate a stable cardiac silhouette. Decreased left pleural effusion and left basilar consolidation. New right pleural effusion, obscuring the posterior costophrenic angle and seen along  the major fissure. Right lower lobe consolidation is noted. No pneumothorax. No acute bony abnormalities. IMPRESSION: 1. Bibasilar consolidation, favor atelectasis. 2. Bilateral pleural effusions. Electronically Signed   By: Randa Ngo M.D.   On: 06/02/2020 16:49   DG Pelvis 1-2 Views  Result Date: 06/02/2020 CLINICAL DATA:  [place], pain EXAM: PELVIS - 1-2 VIEW COMPARISON:  05/26/2020, 04/24/2020 FINDINGS: Single frontal view of the pelvis demonstrates partial visualization of a right hip arthroplasty. No acute displaced fractures. Stable sclerosis of the right side pubic symphysis. Stable spondylosis at the lumbosacral junction. Diffuse vascular  calcifications unchanged. IMPRESSION: 1. Stable appearance of the pelvis.  No acute fractures. Electronically Signed   By: Randa Ngo M.D.   On: 06/02/2020 20:48   CT ANGIO CHEST PE W OR WO CONTRAST  Result Date: 06/02/2020 CLINICAL DATA:  Hypoxia, dyspnea EXAM: CT ANGIOGRAPHY CHEST WITH CONTRAST TECHNIQUE: Multidetector CT imaging of the chest was performed using the standard protocol during bolus administration of intravenous contrast. Multiplanar CT image reconstructions and MIPs were obtained to evaluate the vascular anatomy. CONTRAST:  45mL OMNIPAQUE IOHEXOL 350 MG/ML SOLN COMPARISON:  04/24/2020 FINDINGS: Cardiovascular: There is excellent opacification of the pulmonary arterial tree. There is no intraluminal filling defect identified to suggest acute pulmonary embolism. The central pulmonary arteries are of normal caliber. Moderate to severe multi-vessel coronary artery calcification. Global cardiac size is within normal limits. No pericardial effusion. There is extensive atherosclerotic calcification within the thoracic aorta. No aortic aneurysm. Mediastinum/Nodes: Thyroid unremarkable. No pathologic thoracic adenopathy. Esophagus unremarkable Lungs/Pleura: Mild centrilobular emphysema. Complete collapse of the right middle lobe has developed. A  central obstructing lesion is not identified, however. There has developed moderate bilateral pleural effusions, left greater than right, with subtotal collapse of the lower lobes bilaterally, left greater than right. Superimposed focal consolidation is seen within the basilar lingula, likely infectious or inflammatory in the acute setting. No pneumothorax. Upper Abdomen: No acute abnormality. Musculoskeletal: There has developed extensive dependent subcutaneous body wall edema within the posterior thoracoabdominal wall, new since prior examination, nonspecific. This could reflect dependent edema in the setting of anasarca, or by a local inflammatory process such as cellulitis. No acute bone abnormality. Review of the MIP images confirms the above findings. IMPRESSION: No pulmonary embolism. Moderate to severe multi-vessel coronary artery calcification. Interval development of complete right middle lobe collapse and subtotal collapse of the lower lobes bilaterally, likely passive related to new moderate bilateral pleural effusions. Focal pulmonary infiltrate within the basilar lingula, likely infectious in the acute setting. Aspiration could appear similarly. Dependent subcutaneous body wall edema within the posterior thoracoabdominal wall, new since prior examination. Correlation with clinical examination would be helpful to exclude a local inflammatory process such as cellulitis. Alternatively, this could be seen in the setting of anasarca and resultant dependent body wall edema. Aortic Atherosclerosis (ICD10-I70.0) and Emphysema (ICD10-J43.9). Electronically Signed   By: Fidela Salisbury MD   On: 06/02/2020 23:33   DG Chest Port 1 View  Result Date: 06/04/2020 CLINICAL DATA:  74 year old female with a history of thoracentesis EXAM: PORTABLE CHEST 1 VIEW COMPARISON:  CT 06/02/2020 FINDINGS: Cardiomediastinal silhouette within normal limits. No evidence of interlobular septal thickening. Coarsened interstitial  markings throughout the lungs. Blunting of the bilateral costophrenic angles compatible with small bilateral pleural effusions. No pneumothorax. No acute displaced fracture. IMPRESSION: No complicating features status post left thoracentesis. Electronically Signed   By: Corrie Mckusick D.O.   On: 06/04/2020 12:16   US THORACENTESIS ASP PLEURAL SPACE W/IMG GUIDE  Result Date: 06/04/2020 INDICATION: Patient with history of COPD, acute on chronic hypoxic respiratory failure, new onset bilateral pleural effusions. Request to IR for diagnostic and therapeutic thoracentesis. EXAM: ULTRASOUND GUIDED LEFT THORACENTESIS MEDICATIONS: 10 mL 1% lidocaine COMPLICATIONS: None immediate. PROCEDURE: An ultrasound guided thoracentesis was thoroughly discussed with the patient and questions answered. The benefits, risks, alternatives and complications were also discussed. The patient understands and wishes to proceed with the procedure. Written consent was obtained. Ultrasound was performed to localize and mark an adequate pocket of fluid in the left chest. The area was  then prepped and draped in the normal sterile fashion. 1% Lidocaine was used for local anesthesia. Under ultrasound guidance a 6 Fr Safe-T-Centesis catheter was introduced. Thoracentesis was performed. The catheter was removed and a dressing applied. FINDINGS: A total of approximately 600 mL of clear yellow fluid was removed. Samples were sent to the laboratory as requested by the clinical team. IMPRESSION: Successful ultrasound guided left thoracentesis yielding 600 mL of pleural fluid. Read by Candiss Norse, PA-C Electronically Signed   By: Markus Daft M.D.   On: 06/04/2020 12:13    Scheduled Meds:  sodium chloride   Intravenous Once   guaiFENesin  600 mg Oral BID   triamcinolone   Topical BID   And   hydrocerin   Topical BID   potassium chloride  40 mEq Oral Once   rOPINIRole  1 mg Oral QHS   sodium chloride flush  3 mL Intravenous Q12H    Continuous Infusions:  sodium chloride     azithromycin 500 mg (06/04/20 0151)   cefTRIAXone (ROCEPHIN)  IV 1 g (06/03/20 2153)     LOS: 2 days   Time spent: 40 minutes   Deema Juncaj Loann Quill, MD [facility]  If 7PM-7AM, please contact night-coverage [email or web address] 06/04/2020, 12:45 PM

## 2020-06-04 NOTE — TOC Initial Note (Signed)
Transition of Care [facility]) - Initial/Assessment Note    Patient Details  Name: Mariah [name] MRN: [id number] Date of Birth: [birth year]  Transition of Care [facility]) CM/SW Contact:    Eileen Stanford, LCSW Phone Number: 06/04/2020, 10:55 AM  Clinical Narrative:  [place] pt's niece asked to speak to CSW. Release of information signed so that CSW can speak to Niece. Pt's niece concerned about pt d/c home. Pt's niece states pt lives home alone and it would not be safe. Pt's niece states pt is moving in with her the week after Thanksgiving. Pt's niece thinks that pt should go to SNF to get stronger and more independent prior to moving in with niece. PT and OT have not been able to work with pt yet--there is no recommendation. Pt's niece request that CSW explain the need for SNF to pt--as she may be a little hesitant. Pt's niece states they have a family member that works in the system at [facility] and this would be the best SNF for the pt. Pt is not in the room, pt is in a procedure. CSW will check in with pt this evening.                  Expected Discharge Plan: Skilled Nursing Facility Barriers to Discharge: Continued Medical Work up   Patient Goals and CMS Choice Patient states their goals for this hospitalization and ongoing recovery are:: to get patient stronger      Expected Discharge Plan and Services Expected Discharge Plan: [place] Acute Care Choice: [place] Living arrangements for the past 2 months: Single Family Home                                      Prior Living Arrangements/Services Living arrangements for the past 2 months: Single Family Home Lives with:: Self Patient language and need for interpreter reviewed:: Yes Do you feel safe going back to the place where you live?: Yes      Need for Family Participation in Patient Care: Yes (Comment) Care giver support system in place?: Yes (comment)   Criminal Activity/Legal  Involvement Pertinent to Current Situation/Hospitalization: No - Comment as needed  Activities of Daily Living Home Assistive Devices/Equipment: Cane (specify quad or straight), Walker (specify type) ADL Screening (condition at time of admission) Patient's cognitive ability adequate to safely complete daily activities?: Yes Is the patient deaf or have difficulty hearing?: No Does the patient have difficulty seeing, even when wearing glasses/contacts?: No Does the patient have difficulty concentrating, remembering, or making decisions?: No Patient able to express need for assistance with ADLs?: No Does the patient have difficulty dressing or bathing?: Yes Independently performs ADLs?: Yes (appropriate for developmental age) Communication: Independent Dressing (OT): Independent Grooming: Independent Feeding: Independent Bathing: Independent Toileting: Independent In/Out Bed: Independent Walks in Home: Independent with device (comment) Does the patient have difficulty walking or climbing stairs?: No Weakness of Legs: Both Weakness of Arms/Hands: None  Permission Sought/Granted Permission sought to share information with : Family Supports Permission granted to share information with : Yes, Release of Information Signed  Share Information with NAME: Iyah  Permission granted to share info w AGENCY: [facility] [employer] and exercise psychologist granted to share info w Relationship: niece     Emotional Assessment       Orientation: : Oriented to Place, Oriented to  Time, Oriented  to Situation, Oriented to Self Alcohol / Substance Use: Not Applicable Psych Involvement: No (comment)  Admission diagnosis:  Shortness of breath [R06.02] Weakness [R53.1] Acute respiratory failure with hypoxia (HCC) [J96.01] Patient Active Problem List   Diagnosis Date Noted  . CAP (community acquired pneumonia) 06/03/2020  . Hypoalbuminemia 06/03/2020  . Cutaneous horn 06/03/2020  . Pleural effusion 06/03/2020  . Acute  on chronic diastolic CHF (congestive heart failure) ([place]) 06/03/2020  . Acute respiratory failure with hypoxia ([place]) 06/02/2020  . Irregular heartbeat 05/30/2020  . Head injury 05/30/2020  . Bilateral edema of lower extremity 05/30/2020  . Closed fracture of right hand 05/30/2020  . Blood transfusion reaction 05/30/2020  . Symptomatic anemia 05/26/2020  . CKD (chronic kidney disease), stage III ([place]) 05/26/2020  . Hematoma of right thigh 05/26/2020  . Finger fracture, right 05/26/2020  . Shortness of breath 05/26/2020  . Acute deep vein thrombosis (DVT) of tibial vein of left lower extremity ([place]) 04/05/2020  . Unintentional weight loss 03/01/2020  . Fall (on)(from) sidewalk curb, initial encounter 03/01/2020  . Memory loss 03/01/2020  . Allergic rhinitis 01/10/2018  . Cholelithiasis 03/16/2016  . Peripheral arterial disease ([place]) 02/05/2016  . 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 Abdominal aortic aneurysm (AAA) ([place]) 01/13/2016  . 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 Nerve root inflammation 10/25/2015  . Psoriatic arthritis ([place]) 10/07/2015  . 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 Right hip pain 08/12/2015  . Arthralgia of multiple joints 08/12/2015  . Age related osteoporosis 08/12/2015  . Chronic obstructive pulmonary disease ([place]) 06/24/2015  . Senile purpura ([place]) 06/24/2015  . Abnormal serum level of alkaline phosphatase 06/18/2015  . Elevated hemoglobin ([place]) 06/18/2015  . Lethargy 06/18/2015  . Fistula 06/18/2015  . Blood glucose elevated 06/18/2015  . Gastro-esophageal reflux disease without esophagitis 06/18/2015  . Restless leg 06/18/2015  . Avitaminosis D 06/18/2015  . Mechanical and motor problems with internal organs 10/11/2009  . Hypercholesteremia 10/11/2009  . Arthritis, degenerative 09/25/2009  . Essential (primary) hypertension 09/25/2009  . Psoriasis 09/25/2009  . Compulsive tobacco user syndrome 09/25/2009   PCP:  [name],  MD Pharmacy:   [id number] [address], [place] [address] Phone: [phone] Fax: [phone]     Social Determinants of Health (SDOH) Interventions    Readmission Risk Interventions No flowsheet data found.

## 2020-06-05 ENCOUNTER — Inpatient Hospital Stay: Payer: PPO

## 2020-06-05 DIAGNOSIS — E43 Unspecified severe protein-calorie malnutrition: Secondary | ICD-10-CM | POA: Insufficient documentation

## 2020-06-05 LAB — CBC WITH DIFFERENTIAL/PLATELET
Abs Immature Granulocytes: 0.02 10*3/uL (ref 0.00–0.07)
Basophils Absolute: 0 10*3/uL (ref 0.0–0.1)
Basophils Relative: 0 %
Eosinophils Absolute: 0.1 10*3/uL (ref 0.0–0.5)
Eosinophils Relative: 2 %
HCT: 32.8 % — ABNORMAL LOW (ref 36.0–46.0)
Hemoglobin: 10.6 g/dL — ABNORMAL LOW (ref 12.0–15.0)
Immature Granulocytes: 1 %
Lymphocytes Relative: 15 %
Lymphs Abs: 0.6 10*3/uL — ABNORMAL LOW (ref 0.7–4.0)
MCH: 30.1 pg (ref 26.0–34.0)
MCHC: 32.3 g/dL (ref 30.0–36.0)
MCV: 93.2 fL (ref 80.0–100.0)
Monocytes Absolute: 0.5 10*3/uL (ref 0.1–1.0)
Monocytes Relative: 13 %
Neutro Abs: 2.9 10*3/uL (ref 1.7–7.7)
Neutrophils Relative %: 69 %
Platelets: 184 10*3/uL (ref 150–400)
RBC: 3.52 MIL/uL — ABNORMAL LOW (ref 3.87–5.11)
RDW: 15.9 % — ABNORMAL HIGH (ref 11.5–15.5)
WBC: 4.2 10*3/uL (ref 4.0–10.5)
nRBC: 0 % (ref 0.0–0.2)

## 2020-06-05 LAB — COMPREHENSIVE METABOLIC PANEL
ALT: 20 U/L (ref 0–44)
AST: 24 U/L (ref 15–41)
Albumin: 2.6 g/dL — ABNORMAL LOW (ref 3.5–5.0)
Alkaline Phosphatase: 126 U/L (ref 38–126)
Anion gap: 10 (ref 5–15)
BUN: 14 mg/dL (ref 8–23)
CO2: 34 mmol/L — ABNORMAL HIGH (ref 22–32)
Calcium: 8.8 mg/dL — ABNORMAL LOW (ref 8.9–10.3)
Chloride: 92 mmol/L — ABNORMAL LOW (ref 98–111)
Creatinine, Ser: 1.12 mg/dL — ABNORMAL HIGH (ref 0.44–1.00)
GFR, Estimated: 51 mL/min — ABNORMAL LOW (ref >60)
Glucose, Bld: 99 mg/dL (ref 70–99)
Potassium: 3.3 mmol/L — ABNORMAL LOW (ref 3.5–5.1)
Sodium: 136 mmol/L (ref 135–145)
Total Bilirubin: 0.7 mg/dL (ref 0.3–1.2)
Total Protein: 5.1 g/dL — ABNORMAL LOW (ref 6.5–8.1)

## 2020-06-05 LAB — LD, BODY FLUID (OTHER): LD, Body Fluid: 72 IU/L

## 2020-06-05 LAB — PROTEIN, BODY FLUID (OTHER): Total Protein, Body Fluid Other: 0.9 g/dL

## 2020-06-05 LAB — SARS CORONAVIRUS 2 BY RT PCR (HOSPITAL ORDER, PERFORMED IN ~~LOC~~ HOSPITAL LAB): SARS Coronavirus 2: NEGATIVE

## 2020-06-05 MED ORDER — AZITHROMYCIN 250 MG PO TABS: 500.0000 mg | ORAL_TABLET | Freq: Every day | ORAL | Status: DC

## 2020-06-05 MED ORDER — FERROUS SULFATE 325 (65 FE) MG PO TABS
325.0000 mg | ORAL_TABLET | Freq: Two times a day (BID) | ORAL | Status: DC
  Administered 2020-06-05 – 2020-06-07 (×4): 325 mg via ORAL
  Filled 2020-06-05 (×4): qty 1

## 2020-06-05 MED ORDER — PREDNISONE 20 MG PO TABS
20.0000 mg | ORAL_TABLET | Freq: Every day | ORAL | Status: DC
  Administered 2020-06-06 – 2020-06-07 (×2): 20 mg via ORAL
  Filled 2020-06-05 (×2): qty 1

## 2020-06-05 MED ORDER — LEVOFLOXACIN 500 MG PO TABS
500.0000 mg | ORAL_TABLET | Freq: Every day | ORAL | Status: AC
  Administered 2020-06-06: 500 mg via ORAL
  Filled 2020-06-05: qty 1

## 2020-06-05 NOTE — Progress Notes (Signed)
PHARMACIST - PHYSICIAN COMMUNICATION  CONCERNING: Antibiotic IV to Oral Route Change Policy  RECOMMENDATION: This patient is receiving azithromycin by the intravenous route.  Based on criteria approved by the Pharmacy and Therapeutics Committee, the antibiotic(s) is/are being converted to the equivalent oral dose form(s).   DESCRIPTION: These criteria include:  Patient being treated for a respiratory tract infection, urinary tract infection, cellulitis or clostridium difficile associated diarrhea if on metronidazole  The patient is not neutropenic and does not exhibit a GI malabsorption state  The patient is eating (either orally or via tube) and/or has been taking other orally administered medications for a least 24 hours  The patient is improving clinically and has a Tmax < 100.5  If you have questions about this conversion, please contact the [place]  06/05/20

## 2020-06-05 NOTE — Progress Notes (Signed)
Mobility Specialist - Progress Note   06/05/20 1600  Mobility  Activity Ambulated in room  Level of Assistance Modified independent, requires aide device or extra time  Assistive Device Front wheel walker  Distance Ambulated (ft) 90 ft  Mobility Response Tolerated well  Mobility performed by Mobility specialist  $Mobility charge 1 Mobility    Pre-mobility: 70 HR, 99% SpO2 During mobility: 93 HR, 96% SpO2 Post-mobility: 87 HR, 93% SpO2   Pt was sitting in recliner upon arrival utilizing 2L [place] O2. Pt agreed to session. Pt denied any pain, nausea, or fatigue. Pt agreed to trial ambulation without O2 as she stated she does not use oxygen at home. Pt stood with minA. Pt ambulated 90' in room with RW at decreased speed. No LOB noted. Pt denied SOB, O2 maintained > 93% during activity. Overall, pt tolerated session well. Upon return to recliner, pt c/o slight sob and tiredness. Respiratory entered at the end of session. Pt was left in recliner with needs in reach. Nurse was notified and okay'd mobility to leave pt on room air.    Kathee Delton Mobility Specialist 06/05/20, 4:39 PM

## 2020-06-05 NOTE — Progress Notes (Signed)
PROGRESS NOTE   Mariah [name]  OVF:[id number] DOB: 19-Dec-1945 DOA: 06/02/2020 PCP: [name], MD  Brief Narrative:  74 year old white female Recent diagnosis DVT about 05/08/2020 on Eliquis HTN COPD on 2 L oxygen at home AAA Psoriasis CKD 3 Reflux Tobacco abuse Recent admission 05/26/2020 through 05/28/2020 found to have hemoglobin 6.8 secondary to large right thigh hematoma with fall-she also had a finger fracture at the time orthopedics-recommended holding Eliquis/aspirin found to have left pleural effusion at the time and received IV Lasix  Readmitted with shortness of breath 11/14-found to have new worsening pleural effusion possible Aspiration Pneumonia and Was Started on CAP coverage Pulmonology saw the patient and patient underwent thoracentesis 600 cc clear yellow fluid  Assessment & Plan:   Active Problems:   Compulsive tobacco user syndrome   Chronic obstructive pulmonary disease (HCC)   CKD (chronic kidney disease), stage III (HCC)   Acute respiratory failure with hypoxia (HCC)   CAP (community acquired pneumonia)   Hypoalbuminemia   Cutaneous horn   Pleural effusion   Acute on chronic diastolic CHF (congestive heart failure) ([place])   1. Acute hypoxic respiratory failure 2. Right-sided pleural effusion 3. ?  Community-acquired pneumonia 4. Acute superimposed on chronic diastolic heart failure echo 05/27/2020 EF 60-65% a. Respiratory failure secondary to effusion status post tap by IR 11/16 does not look infectious and does not seem to have nucleated cells above 10,000 therefore this is unlikely to be an infectious etiology b. Paucity findings re: pneumonia, de-escalating to p.o. Levaquin stop date 11/18 c. repeat two-view x-ray 11/17 does not show recurrence d. Continue Lasix 40 p.o. daily, -2.7 L since admit-weight inaccurate at 58 5. DVT 84/1660 complicated by right thigh hematoma a. Continue to hold anticoagulation for the time being likely can resume  the same in about 5 days b. Would hold aspirin on discharge and only resume as an outpatient if absolutely necessary 6. Mild hypokalemia a. Replace with potassium orally 20 mEq b. Recheck in a.m. with magnesium 7. Anemia of blood loss secondary to hematoma a. Saturation ratios are 12 but iron is 32 b. Outpatient recheck and consider IV iron 8. First fifth distal phalange fractures  a. appreciate Dr. Roland Rack input-does not appear to need formal splinting left arm-right little finger splinting was replaced by them 11/16 b. Outpatient follow-up with orthopedics 9. HTN a. Moderately controlled at this time, continue metoprolol 50 XL daily 10. Continued smoker and COPD on 2 L oxygen at baseline a. Patient encouraged cessation continue chronic oxygen b. Continue Breo Ellipta 1 puff as well as albuterol c. Continue Mucinex 600 twice daily d. Solu-Medrol discontinued as no significant wheezing fever prednisone 20 p.o. 11. Psoriasis a. Resume methotrexate 15 weekly on discharge, can continue gabapentin 300 twice daily, calcipotriene 0.005% as per orders in addition to clobetasol 12. Hyperlipidemia a. Continue Zocor 20 q. other day 13. Reflux a. Continue pantoprazole 20 twice daily  DVT prophylaxis: SCD Code Status: Full Family Communication:  Disposition:  Status is: Inpatient  Remains inpatient appropriate because:Hemodynamically unstable, Ongoing active pain requiring inpatient pain management and Altered mental status   Dispo: The patient is from: Home              Anticipated d/c is to: SNF              Anticipated d/c date is: 2 days              Patient currently is not medically stable to d/c.  Consultants:   Pulmonology  Procedures: IR thoracentesis  Antimicrobials: Currently Levaquin   Subjective: Slightly short of breath no chest pain no fever no cough no sputum Well no distress  close to her usual but is unable to mobilize   Objective: Vitals:   06/04/20 2017  06/05/20 0300 06/05/20 0354 06/05/20 0800  BP: 127/74  122/62 113/70  Pulse: 86  72 80  Resp: 18 11 17    Temp: (!) 97.5 F (36.4 C)  98.3 F (36.8 C)   TempSrc: Oral   Oral  SpO2: 98%  99%   Weight:  58 kg    Height:        Intake/Output Summary (Last 24 hours) at 06/05/2020 0820 Last data filed at 06/05/2020 0400 Gross per 24 hour  Intake 1342.18 ml  Output 1600 ml  Net -257.82 ml   Filed Weights   06/02/20 1358 06/04/20 0545 06/05/20 0300  Weight: 58.4 kg 60.2 kg 58 kg    Examination:  EOMI NCAT elderly-appearing female no distress Neck soft supple no submandibular lymphadenopathy CTA B no added sound no rales no rhonchi S1-S2 no murmur rub or gallop on monitors PVC is noted Neurologically intact no focal deficit  Data Reviewed: I have personally reviewed following labs and imaging studies  Hemoglobin 10.6 platelet 184 Potassium 3.3 BUNs/creatinine up from 12/1.0-40/1.12   Radiology Studies: DG Chest Port 1 View  Result Date: 06/04/2020 CLINICAL DATA:  74 year old female with a history of thoracentesis EXAM: PORTABLE CHEST 1 VIEW COMPARISON:  CT 06/02/2020 FINDINGS: Cardiomediastinal silhouette within normal limits. No evidence of interlobular septal thickening. Coarsened interstitial markings throughout the lungs. Blunting of the bilateral costophrenic angles compatible with small bilateral pleural effusions. No pneumothorax. No acute displaced fracture. IMPRESSION: No complicating features status post left thoracentesis. Electronically Signed   By: Corrie Mckusick D.O.   On: 06/04/2020 12:16   US THORACENTESIS ASP PLEURAL SPACE W/IMG GUIDE  Result Date: 06/04/2020 INDICATION: Patient with history of COPD, acute on chronic hypoxic respiratory failure, new onset bilateral pleural effusions. Request to IR for diagnostic and therapeutic thoracentesis. EXAM: ULTRASOUND GUIDED LEFT THORACENTESIS MEDICATIONS: 10 mL 1% lidocaine COMPLICATIONS: None immediate. PROCEDURE: An  ultrasound guided thoracentesis was thoroughly discussed with the patient and questions answered. The benefits, risks, alternatives and complications were also discussed. The patient understands and wishes to proceed with the procedure. Written consent was obtained. Ultrasound was performed to localize and mark an adequate pocket of fluid in the left chest. The area was then prepped and draped in the normal sterile fashion. 1% Lidocaine was used for local anesthesia. Under ultrasound guidance a 6 Fr Safe-T-Centesis catheter was introduced. Thoracentesis was performed. The catheter was removed and a dressing applied. FINDINGS: A total of approximately 600 mL of clear yellow fluid was removed. Samples were sent to the laboratory as requested by the clinical team. IMPRESSION: Successful ultrasound guided left thoracentesis yielding 600 mL of pleural fluid. Read by Candiss Norse, PA-C Electronically Signed   By: Markus Daft M.D.   On: 06/04/2020 12:13     Scheduled Meds: . sodium chloride   Intravenous Once  . vitamin C  250 mg Oral BID  . aspirin EC  81 mg Oral Daily  . donepezil  5 mg Oral QHS  . feeding supplement  237 mL Oral BID BM  . ferrous [id number] C-folic acid  1 capsule Oral BID PC  . ferrous sulfate  325 mg Oral BID WC  . fluticasone furoate-vilanterol  1 puff Inhalation  Daily  . furosemide  40 mg Oral Daily  . gabapentin  300 mg Oral BID  . guaiFENesin  600 mg Oral BID  . triamcinolone   Topical BID   And  . hydrocerin   Topical BID  . [START ON 06/06/2020] levofloxacin  500 mg Oral Daily  . metoprolol succinate  50 mg Oral Daily  . multivitamin with minerals  1 tablet Oral Daily  . nicotine  7 mg Transdermal Daily  . pantoprazole  20 mg Oral BID AC  . [START ON 06/06/2020] predniSONE  20 mg Oral QAC breakfast  . rOPINIRole  1 mg Oral QHS  . simvastatin  20 mg Oral QHS  . sodium chloride flush  3 mL Intravenous Q12H   Continuous Infusions: . sodium chloride  Stopped (06/05/20 0200)  . azithromycin Stopped (06/05/20 0011)  . cefTRIAXone (ROCEPHIN)  IV Stopped (06/04/20 1827)     LOS: 3 days    Time spent: 40  Nita Sells, MD [facility] To contact the attending provider between 7A-7P or the covering provider during after hours 7P-7A, please log into the web site [email or web address] and access using universal [place] password for that web site. If you do not have the password, please call the hospital operator.  06/05/2020, 8:20 AM

## 2020-06-05 NOTE — TOC Progression Note (Signed)
Transition of Care [facility]) - Progression Note    Patient Details  Name: Mariah [name] MRN: [id number] Date of Birth: 06-20-1946  Transition of Care [facility]) CM/SW [place], LCSW Phone Number: 06/05/2020, 10:26 AM  Clinical Narrative:   CSW spoke with pt and pt is agreeable to SNF. PT has recommended SNF. Pt is agree[facility] to [facility]. CSW will send referral.    Expected Discharge Plan: [place] Barriers to Discharge: Continued Medical Work up  Expected Discharge Plan and Services Expected Discharge Plan: [place] Choice: [place] arrangements for the past 2 months: Single Family Home                                       Social Determinants of Health (SDOH) Interventions    Readmission Risk Interventions No flowsheet data found.

## 2020-06-05 NOTE — Progress Notes (Signed)
Pulmonary Medicine          Date: 06/05/2020,   MRN# [id number] Mariah [name] Mar 21, 1946     AdmissionWeight: 58.4 kg                 CurrentWeight: 31 kg   Referring physician: Dr. Doristine Bosworth   CHIEF COMPLAINT:   Bilateral pleural effusions   HISTORY OF PRESENT ILLNESS   This is a pleasant 74 year old female with a history of essential hypertension, AAA, DVT, COPD, GERD, CKD, osteoarthritis, secondary erythrocytosis, restless leg syndrome recurrent falls, accessory spleen, low vitamin D chronically, tobacco abuse lifelong history of smoking, atrial fibrillation, early dementia, who recently was discharged from the hospital post mechanical fall with a right thigh hematoma.  She also uses oxygen at home and was discharged on 2 L however states that she has had to increase this to 3 to 4 L/min nasal cannula supplementary oxygen.  She was on Eliquis previously however due to extensive bruising stop to this.  She is chronically anemic and had anemia less than 9 hemoglobin.  She had a CT chest done which shows bilateral pleural effusions and pulmonary consultation was placed for further evaluation management.  Anaiah (niece) is present during my evaluation. She states that patient will be moving in with her and she's concerned that it will be hard for patient to move.     06/05/20- patient is improved post thoracetnesis and diruesis. She is cleared from pulmonary for d/c home. I recommend lasix 20mg  for home until I see patient in clinic.  She should also have rehab for physical deconditioning.   PAST MEDICAL HISTORY   Past Medical History:  Diagnosis Date  . Abdominal aortic aneurysm (AAA) ([place])   . Arthritis   . COPD (chronic obstructive pulmonary disease) ([place])   . GERD (gastroesophageal reflux disease)   . Headache    sinus  . History of adenomatous polyp of colon   . Hyperlipidemia   . Hypertension   . Psoriasis   . Right lumbar radiculitis   . Vitamin D deficiency       SURGICAL HISTORY   Past Surgical History:  Procedure Laterality Date  . APPENDECTOMY  2000  . BREAST BIOPSY Right 09/2016   radial scar;COMPLEX SCLEROSING LESION  . BREAST LUMPECTOMY Right 11/24/2016   COMPLEX SCLEROSING LESION  . BREAST LUMPECTOMY WITH NEEDLE LOCALIZATION Right 11/24/2016   Procedure: BREAST LUMPECTOMY WITH NEEDLE LOCALIZATION;  Surgeon: Leonie Green, MD;  Location: [facility] ORS;  Service: General;  Laterality: Right;  . COLONOSCOPY WITH PROPOFOL N/A 08/10/2016   Procedure: COLONOSCOPY WITH PROPOFOL;  Surgeon: Manya Silvas, MD;  Location: [facility] ENDOSCOPY;  Service: Endoscopy;  Laterality: N/A;  . ESOPHAGOGASTRODUODENOSCOPY (EGD) WITH PROPOFOL N/A 08/10/2016   Procedure: ESOPHAGOGASTRODUODENOSCOPY (EGD) WITH PROPOFOL;  Surgeon: Manya Silvas, MD;  Location: [facility] ENDOSCOPY;  Service: Endoscopy;  Laterality: N/A;  . FOOT SURGERY Bilateral   . JOINT REPLACEMENT Right 01/27/2016   hip  . 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 TOTAL HIP ARTHROPLASTY Right 01/27/2016   Procedure: TOTAL HIP ARTHROPLASTY;  Surgeon: Dereck Leep, MD;  Location: [facility] ORS;  Service: Orthopedics;  Laterality: Right;     FAMILY HISTORY   Family History  Problem Relation Age of Onset  . Cancer Mother        kidney  . Arthritis Father   . Heart disease Father   . COPD Sister   . Fibromyalgia Sister   .  Osteoarthritis Sister   . Osteoarthritis Maternal Aunt   . Colon cancer Paternal Aunt   . Colon cancer Paternal Uncle   . Colon cancer Paternal Grandmother   . Breast cancer Neg Hx      SOCIAL HISTORY   Social History   Tobacco Use  . Smoking status: Current Every Day Smoker    Packs/day: 1.00    Years: 0.00    Pack years: 0.00  . Smokeless tobacco: Never Used  Vaping Use  . Vaping Use: Former  Substance Use Topics  . Alcohol use: Yes    Comment: occasionally glass of wine  . Drug use: No     MEDICATIONS    Home Medication:    Current Medication:  Current  Facility-Administered Medications:  .  0.9 %  sodium chloride infusion (Manually program via Guardrails IV Fluids), , Intravenous, Once, Doutova, Anastassia, MD .  0.9 %  sodium chloride infusion, 250 mL, Intravenous, PRN, Toy Baker, MD, Stopped at 06/05/20 0200 .  acetaminophen (TYLENOL) tablet 650 mg, 650 mg, Oral, Q6H PRN **OR** acetaminophen (TYLENOL) suppository 650 mg, 650 mg, Rectal, Q6H PRN, Doutova, Anastassia, MD .  albuterol (PROVENTIL) (2.5 MG/3ML) 0.083% nebulizer solution 2.5 mg, 2.5 mg, Nebulization, Q2H PRN, Doutova, Anastassia, MD .  albuterol (VENTOLIN HFA) 108 (90 Base) MCG/ACT inhaler 2 puff, 2 puff, Inhalation, Q4H PRN, Pahwani, Rinka R, MD .  ascorbic acid (VITAMIN C) tablet 250 mg, 250 mg, Oral, BID, Pahwani, Rinka R, MD, 250 mg at 06/05/20 0759 .  aspirin EC tablet 81 mg, 81 mg, Oral, Daily, Pahwani, Rinka R, MD, 81 mg at 06/05/20 1040 .  donepezil (ARICEPT) tablet 5 mg, 5 mg, Oral, QHS, Pahwani, Rinka R, MD, 5 mg at 06/04/20 2137 .  feeding supplement (ENSURE ENLIVE / ENSURE PLUS) liquid 237 mL, 237 mL, Oral, BID BM, Pahwani, Rinka R, MD, 237 mL at 06/05/20 1045 .  ferrous [id number] C-folic acid (TRINSICON / FOLTRIN) capsule 1 capsule, 1 capsule, Oral, BID PC, Pahwani, Rinka R, MD, 1 capsule at 06/05/20 0800 .  ferrous sulfate tablet 325 mg, 325 mg, Oral, BID WC, Samtani, Jai-Gurmukh, MD .  fluticasone furoate-vilanterol (BREO ELLIPTA) 100-25 MCG/INH 1 puff, 1 puff, Inhalation, Daily, Pahwani, Rinka R, MD, 1 puff at 06/05/20 0802 .  furosemide (LASIX) tablet 40 mg, 40 mg, Oral, Daily, Pahwani, Rinka R, MD, 40 mg at 06/05/20 1040 .  gabapentin (NEURONTIN) capsule 300 mg, 300 mg, Oral, BID, Pahwani, Rinka R, MD, 300 mg at 06/05/20 1040 .  guaiFENesin (MUCINEX) 12 hr tablet 600 mg, 600 mg, Oral, BID, Doutova, Anastassia, MD, 600 mg at 06/05/20 1040 .  triamcinolone cream (KENALOG) 0.1 %, , Topical, BID, Given at 06/05/20 1043 **AND** hydrocerin (EUCERIN)  cream, , Topical, BID, Toy Baker, MD, Given at 06/05/20 1042 .  HYDROcodone-acetaminophen (NORCO/VICODIN) 5-325 MG per tablet 1-2 tablet, 1-2 tablet, Oral, Q4H PRN, Toy Baker, MD, 2 tablet at 06/04/20 0154 .  [START ON 06/06/2020] levofloxacin (LEVAQUIN) tablet 500 mg, 500 mg, Oral, Daily, Samtani, Jai-Gurmukh, MD .  metoprolol succinate (TOPROL-XL) 24 hr tablet 50 mg, 50 mg, Oral, Daily, Pahwani, Rinka R, MD, 50 mg at 06/05/20 1040 .  multivitamin with minerals tablet 1 tablet, 1 tablet, Oral, Daily, Pahwani, Rinka R, MD, 1 tablet at 06/05/20 1040 .  nicotine (NICODERM CQ - dosed in mg/24 hr) patch 7 mg, 7 mg, Transdermal, Daily, Lanney Gins, Akeema Broder, MD, 7 mg at 06/05/20 1040 .  pantoprazole (PROTONIX) EC tablet 20 mg, 20 mg, Oral, BID  AC, Pahwani, Rinka R, MD, 20 mg at 06/05/20 0759 .  [START ON 06/06/2020] predniSONE (DELTASONE) tablet 20 mg, 20 mg, Oral, QAC breakfast, Samtani, Jai-Gurmukh, MD .  rOPINIRole (REQUIP) tablet 1 mg, 1 mg, Oral, QHS, Vladimir Crofts, MD, 1 mg at 06/04/20 2138 .  simvastatin (ZOCOR) tablet 20 mg, 20 mg, Oral, QHS, Pahwani, Rinka R, MD, 20 mg at 06/04/20 2138 .  sodium chloride flush (NS) 0.9 % injection 3 mL, 3 mL, Intravenous, Q12H, Doutova, Anastassia, MD, 3 mL at 06/05/20 1045 .  sodium chloride flush (NS) 0.9 % injection 3 mL, 3 mL, Intravenous, PRN, Doutova, Anastassia, MD    ALLERGIES   Clindamycin/lincomycin and Amoxicillin     REVIEW OF SYSTEMS    Review of Systems:  Gen:  Denies  fever, sweats, chills weigh loss  HEENT: Denies blurred vision, double vision, ear pain, eye pain, hearing loss, nose bleeds, sore throat Cardiac:  No dizziness, chest pain or heaviness, chest tightness,edema Resp:   Denies cough or sputum porduction, shortness of breath,wheezing, hemoptysis,  Gi: Denies swallowing difficulty, stomach pain, nausea or vomiting, diarrhea, constipation, bowel incontinence Gu:  Denies bladder incontinence, burning urine Ext:    Denies Joint pain, stiffness or swelling Skin: Denies  skin rash, easy bruising or bleeding or hives Endoc:  Denies polyuria, polydipsia , polyphagia or weight change Psych:   Denies depression, insomnia or hallucinations   Other:  All other systems negative   VS: BP 115/71 (BP Location: Right Arm)   Pulse 88   Temp 97.9 F (36.6 C) (Oral)   Resp 17   Ht 5\' 1"  (1.549 m)   Wt 58 kg   SpO2 99%   BMI 24.17 kg/m      PHYSICAL EXAM    GENERAL:NAD, no fevers, chills, no weakness no fatigue HEAD: Normocephalic, atraumatic.  EYES: Pupils equal, round, reactive to light. Extraocular muscles intact. No scleral icterus.  MOUTH: Moist mucosal membrane. Dentition intact. No abscess noted.  EAR, NOSE, THROAT: Clear without exudates. No external lesions.  NECK: Supple. No thyromegaly. No nodules. No JVD.  PULMONARY: patient with decreased air entry bilaterally  CARDIOVASCULAR: S1 and S2. Regular rate and rhythm. No murmurs, rubs, or gallops. No edema. Pedal pulses 2+ bilaterally.  GASTROINTESTINAL: Soft, nontender, nondistended. No masses. Positive bowel sounds. No hepatosplenomegaly.  MUSCULOSKELETAL: No swelling, clubbing, or edema. Range of motion full in all extremities.  NEUROLOGIC: Cranial nerves II through XII are intact. No gross focal neurological deficits. Sensation intact. Reflexes intact.  SKIN: No ulceration, lesions, rashes, or cyanosis. Skin warm and dry. Turgor intact.  PSYCHIATRIC: Mood, affect within normal limits. The patient is awake, alert and oriented x 3. Insight, judgment intact.       IMAGING    DG Chest 2 View  Result Date: 06/05/2020 CLINICAL DATA:  Followup pleural effusion. EXAM: CHEST - 2 VIEW COMPARISON:  06/04/2020 and CT dated 06/02/2020. FINDINGS: Cardiac silhouette is normal in size. No mediastinal or hilar masses or evidence of adenopathy. Small bilateral pleural effusions. Atelectasis versus a small amount of fluid along the minor fissure. There  is dependent opacity at the lung bases consistent with atelectasis. Lungs are hyperexpanded, but otherwise clear. No pneumothorax. Skeletal structures are demineralized. IMPRESSION: 1. Small bilateral pleural effusions with associated dependent atelectasis, without change from the previous day's chest radiograph and similar to the chest CT from 06/02/2020. 2. No convincing pneumonia or pulmonary edema.  No pneumothorax. 3. Hyperexpanded lungs consistent with COPD. Electronically Signed   By:  Lajean Manes M.D.   On: 06/05/2020 10:30   DG Chest 2 View  Result Date: 06/02/2020 CLINICAL DATA:  Weakness, shortness of breath EXAM: CHEST - 2 VIEW COMPARISON:  05/26/2020 FINDINGS: Frontal and lateral views of the chest demonstrate a stable cardiac silhouette. Decreased left pleural effusion and left basilar consolidation. New right pleural effusion, obscuring the posterior costophrenic angle and seen along the major fissure. Right lower lobe consolidation is noted. No pneumothorax. No acute bony abnormalities. IMPRESSION: 1. Bibasilar consolidation, favor atelectasis. 2. Bilateral pleural effusions. Electronically Signed   By: Randa Ngo M.D.   On: 06/02/2020 16:49   DG Chest 2 View  Result Date: 05/26/2020 CLINICAL DATA:  Shortness of breath. EXAM: CHEST - 2 VIEW COMPARISON:  CT chest/abdomen/pelvis 04/24/2020. Chest radiographs 04/09/2020. FINDINGS: Heart size at the upper limits of normal. Aortic atherosclerosis. New from the prior examination of 04/24/2020, there is a small to moderate left pleural effusion with associated left basilar atelectasis and/or consolidation. The right lung is clear. No evidence of pneumothorax. No acute bony abnormality identified. IMPRESSION: Small to moderate left pleural effusion with associated left basilar atelectasis and/or consolidation, new as compared to the examination of 04/24/2020. Aortic Atherosclerosis (ICD10-I70.0). Electronically Signed   By: Kellie Simmering DO    On: 05/26/2020 06:33   DG Pelvis 1-2 Views  Result Date: 06/02/2020 CLINICAL DATA:  [place], pain EXAM: PELVIS - 1-2 VIEW COMPARISON:  05/26/2020, 04/24/2020 FINDINGS: Single frontal view of the pelvis demonstrates partial visualization of a right hip arthroplasty. No acute displaced fractures. Stable sclerosis of the right side pubic symphysis. Stable spondylosis at the lumbosacral junction. Diffuse vascular calcifications unchanged. IMPRESSION: 1. Stable appearance of the pelvis.  No acute fractures. Electronically Signed   By: Randa Ngo M.D.   On: 06/02/2020 20:48   CT Head Wo Contrast  Result Date: 05/19/2020 CLINICAL DATA:  Fall trauma. Patient tripped in the parking lot. Hit head and has laceration above by bra. EXAM: CT HEAD WITHOUT CONTRAST CT CERVICAL SPINE WITHOUT CONTRAST TECHNIQUE: Multidetector CT imaging of the head and cervical spine was performed following the standard protocol without intravenous contrast. Multiplanar CT image reconstructions of the cervical spine were also generated. COMPARISON:  None. FINDINGS: CT HEAD FINDINGS Brain: There is central and cortical atrophy. Periventricular white matter changes are consistent with small vessel disease. There is no intra or extra-axial fluid collection or mass lesion. The basilar cisterns and ventricles have a normal appearance. There is no CT evidence for acute infarction or hemorrhage. Vascular: . No hyperdense vessels. There is atherosclerotic calcification of the internal carotid arteries Skull: Normal. Negative for fracture or focal lesion. Sinuses/Orbits: No acute finding. Other: There is LEFT frontal scalp edema in the supraorbital region. No associated underlying fracture. CT CERVICAL SPINE FINDINGS Alignment: Normal alignment. Skull base and vertebrae: No acute fracture. No primary bone lesion or focal pathologic process. Soft tissues and spinal canal: No prevertebral fluid or swelling. No visible canal hematoma. Disc levels:  Moderate disc height loss notably at C3-4, C4-5, C5-6, and C6-7. Upper chest: Negative. Other: None IMPRESSION: 1. No evidence for acute intracranial abnormality. 2. Atrophy and small vessel disease. 3. LEFT frontal scalp edema in the supraorbital region. 4. No evidence for acute cervical spine abnormality. 5. Moderate mid cervical degenerative changes. Electronically Signed   By: Nolon Nations M.D.   On: 05/19/2020 18:53   CT ANGIO CHEST PE W OR WO CONTRAST  Result Date: 06/02/2020 CLINICAL DATA:  Hypoxia, dyspnea EXAM: CT  ANGIOGRAPHY CHEST WITH CONTRAST TECHNIQUE: Multidetector CT imaging of the chest was performed using the standard protocol during bolus administration of intravenous contrast. Multiplanar CT image reconstructions and MIPs were obtained to evaluate the vascular anatomy. CONTRAST:  21mL OMNIPAQUE IOHEXOL 350 MG/ML SOLN COMPARISON:  04/24/2020 FINDINGS: Cardiovascular: There is excellent opacification of the pulmonary arterial tree. There is no intraluminal filling defect identified to suggest acute pulmonary embolism. The central pulmonary arteries are of normal caliber. Moderate to severe multi-vessel coronary artery calcification. Global cardiac size is within normal limits. No pericardial effusion. There is extensive atherosclerotic calcification within the thoracic aorta. No aortic aneurysm. Mediastinum/Nodes: Thyroid unremarkable. No pathologic thoracic adenopathy. Esophagus unremarkable Lungs/Pleura: Mild centrilobular emphysema. Complete collapse of the right middle lobe has developed. A central obstructing lesion is not identified, however. There has developed moderate bilateral pleural effusions, left greater than right, with subtotal collapse of the lower lobes bilaterally, left greater than right. Superimposed focal consolidation is seen within the basilar lingula, likely infectious or inflammatory in the acute setting. No pneumothorax. Upper Abdomen: No acute abnormality.  Musculoskeletal: There has developed extensive dependent subcutaneous body wall edema within the posterior thoracoabdominal wall, new since prior examination, nonspecific. This could reflect dependent edema in the setting of anasarca, or by a local inflammatory process such as cellulitis. No acute bone abnormality. Review of the MIP images confirms the above findings. IMPRESSION: No pulmonary embolism. Moderate to severe multi-vessel coronary artery calcification. Interval development of complete right middle lobe collapse and subtotal collapse of the lower lobes bilaterally, likely passive related to new moderate bilateral pleural effusions. Focal pulmonary infiltrate within the basilar lingula, likely infectious in the acute setting. Aspiration could appear similarly. Dependent subcutaneous body wall edema within the posterior thoracoabdominal wall, new since prior examination. Correlation with clinical examination would be helpful to exclude a local inflammatory process such as cellulitis. Alternatively, this could be seen in the setting of anasarca and resultant dependent body wall edema. Aortic Atherosclerosis (ICD10-I70.0) and Emphysema (ICD10-J43.9). Electronically Signed   By: Fidela Salisbury MD   On: 06/02/2020 23:33   CT Cervical Spine Wo Contrast  Result Date: 05/19/2020 CLINICAL DATA:  Fall trauma. Patient tripped in the parking lot. Hit head and has laceration above by bra. EXAM: CT HEAD WITHOUT CONTRAST CT CERVICAL SPINE WITHOUT CONTRAST TECHNIQUE: Multidetector CT imaging of the head and cervical spine was performed following the standard protocol without intravenous contrast. Multiplanar CT image reconstructions of the cervical spine were also generated. COMPARISON:  None. FINDINGS: CT HEAD FINDINGS Brain: There is central and cortical atrophy. Periventricular white matter changes are consistent with small vessel disease. There is no intra or extra-axial fluid collection or mass lesion. The  basilar cisterns and ventricles have a normal appearance. There is no CT evidence for acute infarction or hemorrhage. Vascular: . No hyperdense vessels. There is atherosclerotic calcification of the internal carotid arteries Skull: Normal. Negative for fracture or focal lesion. Sinuses/Orbits: No acute finding. Other: There is LEFT frontal scalp edema in the supraorbital region. No associated underlying fracture. CT CERVICAL SPINE FINDINGS Alignment: Normal alignment. Skull base and vertebrae: No acute fracture. No primary bone lesion or focal pathologic process. Soft tissues and spinal canal: No prevertebral fluid or swelling. No visible canal hematoma. Disc levels: Moderate disc height loss notably at C3-4, C4-5, C5-6, and C6-7. Upper chest: Negative. Other: None IMPRESSION: 1. No evidence for acute intracranial abnormality. 2. Atrophy and small vessel disease. 3. LEFT frontal scalp edema in the supraorbital region. 4.  No evidence for acute cervical spine abnormality. 5. Moderate mid cervical degenerative changes. Electronically Signed   By: Nolon Nations M.D.   On: 05/19/2020 18:53   CT FEMUR RIGHT WO CONTRAST  Result Date: 05/26/2020 CLINICAL DATA:  Right thigh hematoma. EXAM: CT OF THE LOWER RIGHT EXTREMITY WITHOUT CONTRAST TECHNIQUE: Multidetector CT imaging of the right lower extremity was performed according to the standard protocol. COMPARISON:  CT chest, abdomen, and pelvis dated April 24, 2020. FINDINGS: Bones/Joint/Cartilage No acute fracture or dislocation. Subacute, healing fractures of right pubic bone and right inferior pubic ramus with associated sclerosis. Prior right total hip arthroplasty. No evidence of hardware failure or loosening. No joint effusion. Mild degenerative changes of the right knee. Osteopenia Ligaments Ligaments are suboptimally evaluated by CT. Muscles and Tendons Grossly intact. Soft tissue Somewhat ill-defined mixed density superficial fluid collection in the right  lateral hip overlying the greater trochanter, measuring approximately 6.5 x 5.3 x 13.9 cm. Extensive circumferential soft tissue swelling of the right thigh. Partially visualized prominent soft tissue edema in the medial left thigh. No soft tissue mass. IMPRESSION: 1. Superficial 6.5 x 5.3 x 13.9 cm hematoma in the right lateral hip overlying the greater trochanter. 2. No acute osseous abnormality. Subacute, healing fractures of the right pubic bone and right inferior pubic ramus. 3. Prior right total hip arthroplasty without evidence of hardware complication. Electronically Signed   By: Titus Dubin M.D.   On: 05/26/2020 10:39   DG Chest Port 1 View  Result Date: 06/04/2020 CLINICAL DATA:  74 year old female with a history of thoracentesis EXAM: PORTABLE CHEST 1 VIEW COMPARISON:  CT 06/02/2020 FINDINGS: Cardiomediastinal silhouette within normal limits. No evidence of interlobular septal thickening. Coarsened interstitial markings throughout the lungs. Blunting of the bilateral costophrenic angles compatible with small bilateral pleural effusions. No pneumothorax. No acute displaced fracture. IMPRESSION: No complicating features status post left thoracentesis. Electronically Signed   By: Corrie Mckusick D.O.   On: 06/04/2020 12:16   DG Hand Complete Right  Result Date: 05/24/2020 CLINICAL DATA:  Pain and bruising in the right hand involving the thumb, ring finger and little finger following a fall 4 days ago. EXAM: RIGHT HAND - COMPLETE 3+ VIEW COMPARISON:  None. FINDINGS: Nondisplaced fracture across the proximal aspect of the 1st distal phalanx, without intra-articular involvement, displacement or angulation. Mildly impacted fracture of the proximal aspect of the 5th distal phalanx with mild ventral displacement of the distal fragment. This has intra-articular extension. No additional fractures are seen and no dislocations are demonstrated. Moderate 1st IP joint degenerative changes. Minimal 1st MCP  joint degenerative changes. IMPRESSION: Fractures of the 1st and 5th distal phalanges, as described above. These results will be called to the ordering clinician or representative by the Radiologist Assistant, and communication documented in the PACS or [employer]. Electronically Signed   By: Claudie Revering M.D.   On: 05/24/2020 15:36   ECHOCARDIOGRAM COMPLETE  Result Date: 05/28/2020    ECHOCARDIOGRAM REPORT   Patient Name:   KELSY POLACK Date of Exam: 05/27/2020 Medical Rec #:  [id number]     Height:       61.0 in Accession #:    [id number]    Weight:       132.3 lb Date of Birth:  1946/03/16      BSA:          1.584 m Patient Age:    1 years      BP:  136/60 mmHg Patient Gender: F             HR:           102 bpm. Exam Location:  [facility] Procedure: 2D Echo, Cardiac Doppler and Color Doppler Indications:     CHF-acute diastolic 101.75  History:         Patient has no prior history of Echocardiogram examinations.                  Stroke; Signs/Symptoms:Syncope.  Sonographer:     Sherrie Sport RDCS (AE) Referring Phys:  [id number] [facility], [place] AMIN Diagnosing Phys: Nelva Bush MD IMPRESSIONS  1. Left ventricular ejection fraction, by estimation, is 60 to 65%. The left ventricle has normal function. The left ventricle has no regional wall motion abnormalities. There is mild left ventricular hypertrophy. Left ventricular diastolic parameters are consistent with Grade I diastolic dysfunction (impaired relaxation).  2. Right ventricular systolic function is normal. The right ventricular size is normal. There is moderately elevated pulmonary artery systolic pressure.  3. Question left pleural effusion. Consider dedicated chest imaging for further evaluation.  4. The mitral valve is degenerative. Mild mitral valve regurgitation.  5. Tricuspid valve regurgitation is mild to moderate.  6. The aortic valve was not well visualized. Aortic valve regurgitation is mild. Mild to moderate aortic valve  sclerosis/calcification is present, without any evidence of aortic stenosis.  7. The inferior vena cava is normal in size with greater than 50% respiratory variability, suggesting right atrial pressure of 3 mmHg. FINDINGS  Left Ventricle: Left ventricular ejection fraction, by estimation, is 60 to 65%. The left ventricle has normal function. The left ventricle has no regional wall motion abnormalities. The left ventricular internal cavity size was normal in size. There is  mild left ventricular hypertrophy. Left ventricular diastolic parameters are consistent with Grade I diastolic dysfunction (impaired relaxation). Right Ventricle: The right ventricular size is normal. No increase in right ventricular wall thickness. Right ventricular systolic function is normal. There is moderately elevated pulmonary artery systolic pressure. The tricuspid regurgitant velocity is 3.50 m/s, and with an assumed right atrial pressure of 3 mmHg, the estimated right ventricular systolic pressure is 77.8 mmHg. Left Atrium: Left atrial size was normal in size. Right Atrium: Right atrial size was normal in size. Pericardium: Question left pleural effusion. Consider dedicated chest imaging for further evaluation. There is no evidence of pericardial effusion. Presence of pericardial fat pad. Mitral Valve: The mitral valve is degenerative in appearance. There is mild thickening of the mitral valve leaflet(s). There is mild calcification of the mitral valve leaflet(s). Mild mitral valve regurgitation. Tricuspid Valve: The tricuspid valve is normal in structure. Tricuspid valve regurgitation is mild to moderate. Aortic Valve: The aortic valve was not well visualized. Aortic valve regurgitation is mild. Mild to moderate aortic valve sclerosis/calcification is present, without any evidence of aortic stenosis. Aortic valve mean gradient measures 7.5 mmHg. Aortic valve peak gradient measures 15.1 mmHg. Aortic valve area, by VTI measures 1.79 cm.  Pulmonic Valve: The pulmonic valve was not well visualized. Pulmonic valve regurgitation is not visualized. No evidence of pulmonic stenosis. Aorta: The aortic root is normal in size and structure. Pulmonary Artery: The pulmonary artery is of normal size. Venous: The inferior vena cava is normal in size with greater than 50% respiratory variability, suggesting right atrial pressure of 3 mmHg. IAS/Shunts: No atrial level shunt detected by color flow Doppler.  LEFT VENTRICLE PLAX 2D LVIDd:  3.58 cm  Diastology LVIDs:         2.55 cm  LV e' medial:    6.74 cm/s LV PW:         1.27 cm  LV E/e' medial:  12.6 LV IVS:        1.21 cm  LV e' lateral:   6.42 cm/s LVOT diam:     2.00 cm  LV E/e' lateral: 13.2 LV SV:         74 LV SV Index:   46 LVOT Area:     3.14 cm  RIGHT VENTRICLE RV S prime:     11.00 cm/s TAPSE (M-mode): 3.3 cm LEFT ATRIUM             Index       RIGHT ATRIUM           Index LA diam:        3.50 cm 2.21 cm/m  RA Area:     14.30 cm LA Vol (A2C):   53.1 ml 33.51 ml/m RA Volume:   31.60 ml  19.94 ml/m LA Vol (A4C):   41.1 ml 25.94 ml/m LA Biplane Vol: 47.8 ml 30.17 ml/m  AORTIC VALVE                    PULMONIC VALVE AV Area (Vmax):    1.36 cm     PV Vmax:        0.75 m/s AV Area (Vmean):   1.57 cm     PV Peak grad:   2.2 mmHg AV Area (VTI):     1.79 cm     RVOT Peak grad: 3 mmHg AV Vmax:           194.50 cm/s AV Vmean:          125.500 cm/s AV VTI:            0.410 m AV Peak Grad:      15.1 mmHg AV Mean Grad:      7.5 mmHg LVOT Vmax:         84.20 cm/s LVOT Vmean:        62.800 cm/s LVOT VTI:          0.234 m LVOT/AV VTI ratio: 0.57  AORTA Ao Root diam: 3.10 cm MITRAL VALVE               TRICUSPID VALVE MV Area (PHT): 5.13 cm    TR Peak grad:   49.0 mmHg MV Decel Time: 148 msec    TR Vmax:        350.00 cm/s MV E velocity: 84.80 cm/s MV A velocity: 97.70 cm/s  SHUNTS MV E/A ratio:  0.87        Systemic VTI:  0.23 m                            Systemic Diam: 2.00 cm Nelva Bush MD  Electronically signed by Nelva Bush MD Signature Date/Time: 05/28/2020/8:51:55 AM    Final    US THORACENTESIS ASP PLEURAL SPACE W/IMG GUIDE  Result Date: 06/04/2020 INDICATION: Patient with history of COPD, acute on chronic hypoxic respiratory failure, new onset bilateral pleural effusions. Request to IR for diagnostic and therapeutic thoracentesis. EXAM: ULTRASOUND GUIDED LEFT THORACENTESIS MEDICATIONS: 10 mL 1% lidocaine COMPLICATIONS: None immediate. PROCEDURE: An ultrasound guided thoracentesis was thoroughly discussed with the patient and questions answered. The benefits, risks, alternatives and complications were  also discussed. The patient understands and wishes to proceed with the procedure. Written consent was obtained. Ultrasound was performed to localize and mark an adequate pocket of fluid in the left chest. The area was then prepped and draped in the normal sterile fashion. 1% Lidocaine was used for local anesthesia. Under ultrasound guidance a 6 Fr Safe-T-Centesis catheter was introduced. Thoracentesis was performed. The catheter was removed and a dressing applied. FINDINGS: A total of approximately 600 mL of clear yellow fluid was removed. Samples were sent to the laboratory as requested by the clinical team. IMPRESSION: Successful ultrasound guided left thoracentesis yielding 600 mL of pleural fluid. Read by Candiss Norse, PA-C Electronically Signed   By: Markus Daft M.D.   On: 06/04/2020 12:13      ASSESSMENT/PLAN   Bilateral pleural effusions      -with partial fissural effusion unlikely to be of significant clinical impact however she does have significant underlying centrilobular emphysema bilaterally     -There is bilateral effusions worse on the left side which will be graded as moderate     -Patient is scheduled for thoracentesis today with pleural fluid studies in process      -The patient does have a history of diastolic CHF as well as longstanding renal insufficiency  however this is mild with GFR only mildly reduced.  Patient is status post transthoracic echo with preserved ejection fraction mild LVH, grade 1 diastolic dysfunction moderately elevated pulmonary artery systolic pressures suggestive of pulmonary hypertension.     -LDH is mildly elevated suggestive of exudate but patient is on lasix which can do that with contraction.      -s/p thoracentesis - will review profile when finalized     -continue lasix 20mg  on outpatient.     -metaneb therapy q4h with saline.      Advanced centrilobular emphysema with COPD   Continue current regimen    - generic COPD care path   - pulmonary clinic on outpatient   - continue levoquin and prednisone 20mg  for 5 days total without taper.         Chronic hypoxemia  - continue supplemental O2 2L/min    Chronic deconditioning   Continue PT/OT - recommend inpatient rehab post DC      Thank you for allowing me to participate in the care of this patient.   Patient/Family are satisfied with care plan and all questions have been answered.  This document was prepared using Dragon voice recognition software and may include unintentional dictation errors.     Ottie Glazier, M.D.  Division of [place]

## 2020-06-05 NOTE — NC FL2 (Signed)
[place] LEVEL OF CARE SCREENING TOOL     IDENTIFICATION  Patient Name: Mariah [name] Birthdate: 12-08-1945 Sex: female Admission Date (Current Location): 06/02/2020  [place] and [place] Number:  Engineering geologist and Address:  [facility], [address], [place], [place] [address]      Provider Number: [id number]  Attending Physician Name and Address:  Nita Sells, MD  Relative Name and Phone Number:       Current Level of Care: Hospital Recommended Level of Care: [place] Prior Approval Number:    Date Approved/Denied:   PASRR Number: [id number]  Discharge Plan: SNF    Current Diagnoses: Patient Active Problem List   Diagnosis Date Noted  . CAP (community acquired pneumonia) 06/03/2020  . Hypoalbuminemia 06/03/2020  . Cutaneous horn 06/03/2020  . Pleural effusion 06/03/2020  . Acute on chronic diastolic CHF (congestive heart failure) ([place]) 06/03/2020  . Acute respiratory failure with hypoxia ([place]) 06/02/2020  . Irregular heartbeat 05/30/2020  . Head injury 05/30/2020  . Bilateral edema of lower extremity 05/30/2020  . Closed fracture of right hand 05/30/2020  . Blood transfusion reaction 05/30/2020  . Symptomatic anemia 05/26/2020  . CKD (chronic kidney disease), stage III ([place]) 05/26/2020  . Hematoma of right thigh 05/26/2020  . Finger fracture, right 05/26/2020  . Shortness of breath 05/26/2020  . Acute deep vein thrombosis (DVT) of tibial vein of left lower extremity ([place]) 04/05/2020  . Unintentional weight loss 03/01/2020  . Fall (on)(from) sidewalk curb, initial encounter 03/01/2020  . Memory loss 03/01/2020  . Allergic rhinitis 01/10/2018  . Cholelithiasis 03/16/2016  . Peripheral arterial disease ([place]) 02/05/2016  . 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 Abdominal aortic aneurysm (AAA) ([place]) 01/13/2016  . 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 Nerve root inflammation 10/25/2015  .  Psoriatic arthritis ([place]) 10/07/2015  . 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 Right hip pain 08/12/2015  . Arthralgia of multiple joints 08/12/2015  . Age related osteoporosis 08/12/2015  . Chronic obstructive pulmonary disease ([place]) 06/24/2015  . Senile purpura ([place]) 06/24/2015  . Abnormal serum level of alkaline phosphatase 06/18/2015  . Elevated hemoglobin ([place]) 06/18/2015  . Lethargy 06/18/2015  . Fistula 06/18/2015  . Blood glucose elevated 06/18/2015  . Gastro-esophageal reflux disease without esophagitis 06/18/2015  . Restless leg 06/18/2015  . Avitaminosis D 06/18/2015  . Mechanical and motor problems with internal organs 10/11/2009  . Hypercholesteremia 10/11/2009  . Arthritis, degenerative 09/25/2009  . Essential (primary) hypertension 09/25/2009  . Psoriasis 09/25/2009  . Compulsive tobacco user syndrome 09/25/2009    Orientation RESPIRATION BLADDER Height & Weight     Self, Time, Situation, Place  O2 ([place] 1L) Continent Weight: 127 lb 14.4 oz (58 kg) Height:  5\' 1"  (154.9 cm)  BEHAVIORAL SYMPTOMS/MOOD NEUROLOGICAL BOWEL NUTRITION STATUS      Continent Diet (2 gram sodium diet, thin liquids)  AMBULATORY STATUS COMMUNICATION OF NEEDS Skin   Limited Assist Verbally Normal                       Personal Care Assistance Level of Assistance  Dressing, Feeding, Bathing Bathing Assistance: Limited assistance Feeding assistance: Independent Dressing Assistance: Limited assistance     Functional Limitations Info  Sight, Hearing, Speech Sight Info: Adequate Hearing Info: Adequate Speech Info: Adequate    SPECIAL CARE FACTORS FREQUENCY  PT (By licensed PT), OT (By licensed OT)     PT Frequency: 5x OT Frequency: 5x  Contractures Contractures Info: Not present    Additional Factors Info  Code Status, Allergies Code Status Info: Full Code Allergies Info: Clindamycin/lincomycin, Amoxicillin           Current Medications (06/05/2020):   This is the current hospital active medication list Current Facility-Administered Medications  Medication Dose Route Frequency Provider Last Rate Last Admin  . 0.9 %  sodium chloride infusion (Manually program via Guardrails IV Fluids)   Intravenous Once Doutova, Anastassia, MD      . 0.9 %  sodium chloride infusion  250 mL Intravenous PRN Toy Baker, MD   Stopped at 06/05/20 0200  . acetaminophen (TYLENOL) tablet 650 mg  650 mg Oral Q6H PRN Toy Baker, MD       Or  . acetaminophen (TYLENOL) suppository 650 mg  650 mg Rectal Q6H PRN Doutova, Anastassia, MD      . albuterol (PROVENTIL) (2.5 MG/3ML) 0.083% nebulizer solution 2.5 mg  2.5 mg Nebulization Q2H PRN Doutova, Anastassia, MD      . albuterol (VENTOLIN HFA) 108 (90 Base) MCG/ACT inhaler 2 puff  2 puff Inhalation Q4H PRN Pahwani, Rinka R, MD      . ascorbic acid (VITAMIN C) tablet 250 mg  250 mg Oral BID Pahwani, Rinka R, MD   250 mg at 06/05/20 0759  . aspirin EC tablet 81 mg  81 mg Oral Daily Pahwani, Rinka R, MD   81 mg at 06/04/20 1422  . [START ON 06/06/2020] azithromycin (ZITHROMAX) tablet 500 mg  500 mg Oral Daily Benita Gutter, RPH      . cefTRIAXone (ROCEPHIN) 1 g in sodium chloride 0.9 % 100 mL IVPB  1 g Intravenous Q24H Pahwani, Rinka R, MD   Stopping Infusion hung by another clincian at 06/04/20 1827  . donepezil (ARICEPT) tablet 5 mg  5 mg Oral QHS Pahwani, Rinka R, MD   5 mg at 06/04/20 2137  . feeding supplement (ENSURE ENLIVE / ENSURE PLUS) liquid 237 mL  237 mL Oral BID BM Pahwani, Rinka R, MD      . ferrous [id number] C-folic acid (TRINSICON / FOLTRIN) capsule 1 capsule  1 capsule Oral BID PC Pahwani, Rinka R, MD   1 capsule at 06/05/20 0800  . fluticasone furoate-vilanterol (BREO ELLIPTA) 100-25 MCG/INH 1 puff  1 puff Inhalation Daily Pahwani, Rinka R, MD   1 puff at 06/05/20 0802  . furosemide (LASIX) tablet 40 mg  40 mg Oral Daily Pahwani, Rinka R, MD   40 mg at 06/04/20 1745  . gabapentin  (NEURONTIN) capsule 300 mg  300 mg Oral BID Pahwani, Rinka R, MD   300 mg at 06/04/20 2138  . guaiFENesin (MUCINEX) 12 hr tablet 600 mg  600 mg Oral BID Toy Baker, MD   600 mg at 06/04/20 2138  . triamcinolone cream (KENALOG) 0.1 %   Topical BID Toy Baker, MD   Given at 06/04/20 2140   And  . hydrocerin (EUCERIN) cream   Topical BID Toy Baker, MD   Given at 06/04/20 2140  . HYDROcodone-acetaminophen (NORCO/VICODIN) 5-325 MG per tablet 1-2 tablet  1-2 tablet Oral Q4H PRN Toy Baker, MD   2 tablet at 06/04/20 0154  . methylPREDNISolone sodium succinate (SOLU-MEDROL) 40 mg/mL injection 40 mg  40 mg Intravenous Daily Ottie Glazier, MD   40 mg at 06/04/20 1425  . metoprolol succinate (TOPROL-XL) 24 hr tablet 50 mg  50 mg Oral Daily Pahwani, Rinka R, MD   50 mg at 06/04/20 1422  .  multivitamin with minerals tablet 1 tablet  1 tablet Oral Daily Pahwani, Rinka R, MD      . nicotine (NICODERM CQ - dosed in mg/24 hr) patch 7 mg  7 mg Transdermal Daily Ottie Glazier, MD   7 mg at 06/04/20 1422  . pantoprazole (PROTONIX) EC tablet 20 mg  20 mg Oral BID AC Pahwani, Rinka R, MD   20 mg at 06/05/20 0759  . rOPINIRole (REQUIP) tablet 1 mg  1 mg Oral QHS Vladimir Crofts, MD   1 mg at 06/04/20 2138  . simvastatin (ZOCOR) tablet 20 mg  20 mg Oral QHS Pahwani, Rinka R, MD   20 mg at 06/04/20 2138  . sodium chloride flush (NS) 0.9 % injection 3 mL  3 mL Intravenous Q12H Doutova, Anastassia, MD   3 mL at 06/04/20 2140  . sodium chloride flush (NS) 0.9 % injection 3 mL  3 mL Intravenous PRN Toy Baker, MD         Discharge Medications: Please see discharge summary for a list of discharge medications.  Relevant Imaging Results:  Relevant Lab Results:   Additional Information [id number]  Eileen Stanford, LCSW

## 2020-06-05 NOTE — Care Management Important Message (Signed)
Important Message  Patient Details  Name: Mariah [name] MRN: [id number] Date of Birth: 1946-06-27   Medicare Important Message Given:  Yes     Dannette Barbara 06/05/2020, 11:03 AM

## 2020-06-06 DIAGNOSIS — S62666A Nondisplaced fracture of distal phalanx of right little finger, initial encounter for closed fracture: Secondary | ICD-10-CM | POA: Insufficient documentation

## 2020-06-06 DIAGNOSIS — S62524A Nondisplaced fracture of distal phalanx of right thumb, initial encounter for closed fracture: Secondary | ICD-10-CM | POA: Insufficient documentation

## 2020-06-06 LAB — COMPREHENSIVE METABOLIC PANEL
ALT: 18 U/L (ref 0–44)
AST: 23 U/L (ref 15–41)
Albumin: 2.3 g/dL — ABNORMAL LOW (ref 3.5–5.0)
Alkaline Phosphatase: 114 U/L (ref 38–126)
Anion gap: 12 (ref 5–15)
BUN: 18 mg/dL (ref 8–23)
CO2: 30 mmol/L (ref 22–32)
Calcium: 8.5 mg/dL — ABNORMAL LOW (ref 8.9–10.3)
Chloride: 94 mmol/L — ABNORMAL LOW (ref 98–111)
Creatinine, Ser: 1.15 mg/dL — ABNORMAL HIGH (ref 0.44–1.00)
GFR, Estimated: 50 mL/min — ABNORMAL LOW (ref >60)
Glucose, Bld: 88 mg/dL (ref 70–99)
Potassium: 3.1 mmol/L — ABNORMAL LOW (ref 3.5–5.1)
Sodium: 136 mmol/L (ref 135–145)
Total Bilirubin: 0.6 mg/dL (ref 0.3–1.2)
Total Protein: 4.6 g/dL — ABNORMAL LOW (ref 6.5–8.1)

## 2020-06-06 LAB — MAGNESIUM: Magnesium: 2 mg/dL (ref 1.7–2.4)

## 2020-06-06 LAB — CBC WITH DIFFERENTIAL/PLATELET
Abs Immature Granulocytes: 0.01 10*3/uL (ref 0.00–0.07)
Basophils Absolute: 0 10*3/uL (ref 0.0–0.1)
Basophils Relative: 0 %
Eosinophils Absolute: 0.1 10*3/uL (ref 0.0–0.5)
Eosinophils Relative: 4 %
HCT: 30.9 % — ABNORMAL LOW (ref 36.0–46.0)
Hemoglobin: 10.3 g/dL — ABNORMAL LOW (ref 12.0–15.0)
Immature Granulocytes: 0 %
Lymphocytes Relative: 23 %
Lymphs Abs: 0.8 10*3/uL (ref 0.7–4.0)
MCH: 30.7 pg (ref 26.0–34.0)
MCHC: 33.3 g/dL (ref 30.0–36.0)
MCV: 92 fL (ref 80.0–100.0)
Monocytes Absolute: 0.4 10*3/uL (ref 0.1–1.0)
Monocytes Relative: 10 %
Neutro Abs: 2.2 10*3/uL (ref 1.7–7.7)
Neutrophils Relative %: 63 %
Platelets: 185 10*3/uL (ref 150–400)
RBC: 3.36 MIL/uL — ABNORMAL LOW (ref 3.87–5.11)
RDW: 15.9 % — ABNORMAL HIGH (ref 11.5–15.5)
WBC: 3.5 10*3/uL — ABNORMAL LOW (ref 4.0–10.5)
nRBC: 0 % (ref 0.0–0.2)

## 2020-06-06 LAB — PH, BODY FLUID: pH, Body Fluid: 7.8

## 2020-06-06 MED ORDER — APIXABAN 5 MG PO TABS: 5.0000 mg | ORAL_TABLET | Freq: Two times a day (BID) | ORAL | 3 refills | Status: DC

## 2020-06-06 MED ORDER — POTASSIUM CHLORIDE CRYS ER 20 MEQ PO TBCR: 40.0000 meq | EXTENDED_RELEASE_TABLET | Freq: Every day | ORAL | Status: DC

## 2020-06-06 MED ORDER — NICOTINE 7 MG/24HR TD PT24
7.0000 mg | MEDICATED_PATCH | Freq: Every day | TRANSDERMAL | 0 refills | Status: DC
Start: 2020-06-07 — End: 2020-06-24

## 2020-06-06 MED ORDER — FUROSEMIDE 40 MG PO TABS
40.0000 mg | ORAL_TABLET | Freq: Every day | ORAL | 0 refills | Status: DC
Start: 2020-06-07 — End: 2020-06-24

## 2020-06-06 MED ORDER — TRAMADOL HCL 50 MG PO TABS: ORAL_TABLET | ORAL | 2 refills | Status: DC

## 2020-06-06 MED ORDER — GUAIFENESIN ER 600 MG PO TB12
600.0000 mg | ORAL_TABLET | Freq: Two times a day (BID) | ORAL | Status: DC
Start: 2020-06-06 — End: 2020-09-20

## 2020-06-06 MED ORDER — PREDNISONE 20 MG PO TABS
20.0000 mg | ORAL_TABLET | Freq: Every day | ORAL | 0 refills | Status: DC
Start: 2020-06-07 — End: 2020-06-24

## 2020-06-06 MED ORDER — POTASSIUM CHLORIDE CRYS ER 20 MEQ PO TBCR
40.0000 meq | EXTENDED_RELEASE_TABLET | Freq: Once | ORAL | Status: AC
  Administered 2020-06-06: 40 meq via ORAL
  Filled 2020-06-06: qty 2

## 2020-06-06 MED ORDER — ACETAMINOPHEN 325 MG PO TABS
650.0000 mg | ORAL_TABLET | Freq: Four times a day (QID) | ORAL | Status: DC | PRN
Start: 2020-06-06 — End: 2020-08-20

## 2020-06-06 MED ORDER — ASCORBIC ACID 250 MG PO TABS
250.0000 mg | ORAL_TABLET | Freq: Two times a day (BID) | ORAL | Status: DC
Start: 2020-06-06 — End: 2020-08-20

## 2020-06-06 NOTE — Discharge Summary (Signed)
Physician Discharge Summary  Mariah [name] PPH:[id number] DOB: 01-07-1946 DOA: 06/02/2020  PCP: [name], MD  Admit date: 06/02/2020 Discharge date: 06/06/2020  Time spent: 43 minutes  Recommendations for Outpatient Follow-up:  1. Will need Chem-12 CBC in about 1 week 2. Needs chest x-ray in about 1 week and follow-up with Dr. Milas Hock of pulmonology to determine etiology effusions 3. Recommend recheck of potassium in the outpatient setting and replacement 4. Limited prescription of tramadol given on discharge 5. Follow-up in the outpatient setting with orthopedics Dr.Poggi to be arranged by skilled facility 6. New medications Lasix and Levaquin given this admission  Discharge Diagnoses:  Active Problems:   Compulsive tobacco user syndrome   Chronic obstructive pulmonary disease (HCC)   CKD (chronic kidney disease), stage III (HCC)   Acute respiratory failure with hypoxia (HCC)   CAP (community acquired pneumonia)   Hypoalbuminemia   Cutaneous horn   Pleural effusion   Acute on chronic diastolic CHF (congestive heart failure) (HCC)   Protein-calorie malnutrition, severe   Discharge Condition: Improved  Diet recommendation: Heart healthy fluid limited to 1200 cc  Filed Weights   06/04/20 0545 06/05/20 0300 06/06/20 0557  Weight: 60.2 kg 58 kg 58.5 kg    History of present illness:  74 year old white female Recent diagnosis DVT about 05/08/2020 on Eliquis HTN COPD on 2 L oxygen at home AAA Psoriasis CKD 3 Reflux Tobacco abuse Recent admission 05/26/2020 through 05/28/2020 found to have hemoglobin 6.8 secondary to large right thigh hematoma with fall-she also had a finger fracture at the time orthopedics-recommended holding Eliquis/aspirin found to have left pleural effusion at the time and received IV Lasix  Readmitted with shortness of breath 11/14-found to have new worsening pleural effusion possible Aspiration Pneumonia and Was Started on CAP  coverage Pulmonology saw the patient and patient underwent thoracentesis 600 cc clear yellow fluid   Hospital Course:  1. Acute hypoxic respiratory failure 2. Right-sided pleural effusion 3. ?  Community-acquired pneumonia 4. Acute superimposed on chronic diastolic heart failure echo 05/27/2020 EF 60-65% a. Respiratory failure secondary to effusion status post tap by IR 11/16 does not look infectious and does not seem to have nucleated cells above 10,000 therefore this is unlikely to be an infectious etiology b. Paucity findings re: pneumonia, de-escalating to p.o. Levaquin to continue for 5 days as per pulmonology on discharge c. repeat two-view x-ray 11/17 does not show recurrence d. Continue Lasix 40 p.o. daily, -2.7 L since admit-weight inaccurate at 58 e. Pulmonology will work-up patient's recurrent effusions in the outpatient setting and already are aware of patient to follow-up in the outpatient setting for this work-up 5. DVT 92/9574 complicated by right thigh hematoma a. Continue to hold anticoagulation for the time being likely can resume in the outpatient setting b. Would hold aspirin on discharge and only resume as an outpatient if absolutely necessary 6. Mild hypokalemia a. Replacing on discharge with K. Dur 40 7. Anemia of blood loss secondary to hematoma a. Saturation ratios are 12 but iron is 32 b. Outpatient recheck and consider IV iron 8. First fifth distal phalange fractures  a. appreciate Dr. Roland Rack input-does not appear to need formal splinting left arm-right little finger splinting was replaced by them 11/16 b. Outpatient follow-up with orthopedics 9. HTN a. Moderately controlled at this time, continue metoprolol 50 XL daily 10. Continued smoker and COPD on 2 L oxygen at baseline a. Patient encouraged cessation continue chronic oxygen b. Continue Breo Ellipta 1 puff as well  as albuterol c. Continue Mucinex 600 twice daily d. Solu-Medrol discontinued as no  significant wheezing-see MAR for dosing as per pulmonology 11. Psoriasis a. Resume methotrexate 15 weekly on discharge, can continue gabapentin 300 twice daily, calcipotriene 0.005% as per orders in addition to clobetasol 12. Hyperlipidemia a. Continue Zocor 20 q. other day 13. Reflux a. Continue pantoprazole 20 twice daily   Consultations:  Pulmonology  Discharge Exam: Vitals:   06/06/20 0557 06/06/20 0744  BP: 114/78 110/64  Pulse: 64 66  Resp: 18 18  Temp: (!) 97.5 F (36.4 C) 97.8 F (36.6 C)  SpO2: 100% 99%    General: Awake alert coherent no distress EOMI NCAT no focal deficit still feels short of breath overall but is improved from prior Cardiovascular: S1-S2 no murmur no rub no gallop in A. fib on monitors Respiratory: Mild wheeze no rales no rhonchi no added sound no TVR no TVF  Discharge Instructions   Discharge Instructions    Diet - low sodium heart healthy   Complete by: As directed    Discharge wound care:   Complete by: As directed    Wound care  Daily      Comments: AFTER applying Kenalog or Eucerin cream to excoriated areas, you can place Vaseline gauze over the sites and secure with Kerlex for extremity wounds, if this might be helpful to the patient in any way; otherwise, leave open to air.   Increase activity slowly   Complete by: As directed      Allergies as of 06/06/2020      Reactions   Clindamycin/lincomycin    diarrhea   Amoxicillin Diarrhea      Medication List    TAKE these medications   acetaminophen 325 MG tablet Commonly known as: TYLENOL Take 2 tablets (650 mg total) by mouth every 6 (six) hours as needed for mild pain (or Fever >/= 101).   albuterol 108 (90 Base) MCG/ACT inhaler Commonly known as: VENTOLIN HFA INHALE 1-2 PUFFS INTO THE LUNGS EVERY 6 HOURS AS NEEDED FOR WHEEZING OR SHORTNESS OF BREATH.   apixaban 5 MG Tabs tablet Commonly known as: ELIQUIS Take 1 tablet (5 mg total) by mouth 2 (two) times daily. Start  taking on: June 09, 2020 What changed: These instructions start on June 09, 2020. If you are unsure what to do until then, ask your doctor or other care provider.   ascorbic acid 250 MG tablet Commonly known as: VITAMIN C Take 1 tablet (250 mg total) by mouth 2 (two) times daily.   aspirin EC 81 MG tablet Take 81 mg by mouth daily. Swallow whole.   betamethasone dipropionate 0.05 % ointment Commonly known as: DIPROLENE Apply topically daily.   Breo Ellipta 100-25 MCG/INH Aepb Generic drug: fluticasone furoate-vilanterol Inhale 1 puff into the lungs daily.   calcipotriene 0.005 % cream Commonly known as: DOVONOX APPLY TO SKIN ONCE A DAY APPLY DAILY ON HANDS, FEET AND WRIST X 2 WEEKS, THEN USE 5 DAYS A WEEK   clobetasol 0.05 % external solution Commonly known as: TEMOVATE APPLY TO AFFECTED AREA TWICE A DAY   Cosentyx Sensoready (300 MG) 150 MG/ML Soaj Generic drug: Secukinumab (300 MG Dose) Inject 2 Syringes into the skin every 28 (twenty-eight) days.   donepezil 5 MG tablet Commonly known as: ARICEPT Take 5 mg by mouth at bedtime.   ferrous [id number] C-folic acid capsule Commonly known as: TRINSICON / FOLTRIN Take 1 capsule by mouth 2 (two) times daily after a meal.  ferrous sulfate 325 (65 FE) MG tablet Take 325 mg by mouth 2 (two) times daily.   furosemide 40 MG tablet Commonly known as: LASIX Take 1 tablet (40 mg total) by mouth daily. Start taking on: June 07, 2020   gabapentin 300 MG capsule Commonly known as: NEURONTIN TAKE 1 CAPSULE BY MOUTH TWICE A DAY What changed: additional instructions   guaiFENesin 600 MG 12 hr tablet Commonly known as: MUCINEX Take 1 tablet (600 mg total) by mouth 2 (two) times daily.   levocetirizine 5 MG tablet Commonly known as: XYZAL TAKE 1 TABLET BY MOUTH EVERY DAY IN THE EVENING What changed:   how much to take  how to take this   methotrexate 2.5 MG tablet Commonly known as: RHEUMATREX Take  15 mg by mouth once a week.   metoprolol succinate 50 MG 24 hr tablet Commonly known as: TOPROL-XL TAKE 1 TABLET (50 MG TOTAL) BY MOUTH DAILY. TAKE WITH OR IMMEDIATELY FOLLOWING A MEAL.   nicotine 7 mg/24hr patch Commonly known as: NICODERM CQ - dosed in mg/24 hr Place 1 patch (7 mg total) onto the skin daily. Start taking on: June 07, 2020   pantoprazole 20 MG tablet Commonly known as: PROTONIX TAKE 1 TABLET (20 MG TOTAL) BY MOUTH 2 (TWO) TIMES DAILY BEFORE A MEAL.   potassium chloride SA 20 MEQ tablet Commonly known as: KLOR-CON Take 2 tablets (40 mEq total) by mouth daily.   predniSONE 20 MG tablet Commonly known as: DELTASONE Take 1 tablet (20 mg total) by mouth daily before breakfast. Start taking on: June 07, 2020   rOPINIRole 1 MG tablet Commonly known as: REQUIP TAKE 1 TABLET (1 MG TOTAL) BY MOUTH AT BEDTIME.   simvastatin 20 MG tablet Commonly known as: ZOCOR TAKE 1 TABLET BY MOUTH EVERY DAY What changed: when to take this   traMADol 50 MG tablet Commonly known as: ULTRAM TAKE 1 TABLET BY MOUTH EVERY 6 HOURS AS NEEDED FOR MODERATE OR SEVERE PAIN What changed: See the new instructions.   Vitamin D3 50 MCG (2000 UT) Tabs Take 2,000 Units by mouth daily.            Discharge Care Instructions  (From admission, onward)         Start     Ordered   06/06/20 0000  Discharge wound care:       Comments: Wound care  Daily      Comments: AFTER applying Kenalog or Eucerin cream to excoriated areas, you can place Vaseline gauze over the sites and secure with Kerlex for extremity wounds, if this might be helpful to the patient in any way; otherwise, leave open to air.   06/06/20 1125         Allergies  Allergen Reactions  . Clindamycin/Lincomycin     diarrhea  . Amoxicillin Diarrhea      The results of significant diagnostics from this hospitalization (including imaging, microbiology, ancillary and laboratory) are listed below for reference.     Significant Diagnostic Studies: DG Chest 2 View  Result Date: 06/05/2020 CLINICAL DATA:  Followup pleural effusion. EXAM: CHEST - 2 VIEW COMPARISON:  06/04/2020 and CT dated 06/02/2020. FINDINGS: Cardiac silhouette is normal in size. No mediastinal or hilar masses or evidence of adenopathy. Small bilateral pleural effusions. Atelectasis versus a small amount of fluid along the minor fissure. There is dependent opacity at the lung bases consistent with atelectasis. Lungs are hyperexpanded, but otherwise clear. No pneumothorax. Skeletal structures are demineralized. IMPRESSION: 1.  Small bilateral pleural effusions with associated dependent atelectasis, without change from the previous day's chest radiograph and similar to the chest CT from 06/02/2020. 2. No convincing pneumonia or pulmonary edema.  No pneumothorax. 3. Hyperexpanded lungs consistent with COPD. Electronically Signed   By: Lajean Manes M.D.   On: 06/05/2020 10:30   DG Chest 2 View  Result Date: 06/02/2020 CLINICAL DATA:  Weakness, shortness of breath EXAM: CHEST - 2 VIEW COMPARISON:  05/26/2020 FINDINGS: Frontal and lateral views of the chest demonstrate a stable cardiac silhouette. Decreased left pleural effusion and left basilar consolidation. New right pleural effusion, obscuring the posterior costophrenic angle and seen along the major fissure. Right lower lobe consolidation is noted. No pneumothorax. No acute bony abnormalities. IMPRESSION: 1. Bibasilar consolidation, favor atelectasis. 2. Bilateral pleural effusions. Electronically Signed   By: Randa Ngo M.D.   On: 06/02/2020 16:49   DG Chest 2 View  Result Date: 05/26/2020 CLINICAL DATA:  Shortness of breath. EXAM: CHEST - 2 VIEW COMPARISON:  CT chest/abdomen/pelvis 04/24/2020. Chest radiographs 04/09/2020. FINDINGS: Heart size at the upper limits of normal. Aortic atherosclerosis. New from the prior examination of 04/24/2020, there is a small to moderate left pleural  effusion with associated left basilar atelectasis and/or consolidation. The right lung is clear. No evidence of pneumothorax. No acute bony abnormality identified. IMPRESSION: Small to moderate left pleural effusion with associated left basilar atelectasis and/or consolidation, new as compared to the examination of 04/24/2020. Aortic Atherosclerosis (ICD10-I70.0). Electronically Signed   By: Kellie Simmering DO   On: 05/26/2020 06:33   DG Pelvis 1-2 Views  Result Date: 06/02/2020 CLINICAL DATA:  [place], pain EXAM: PELVIS - 1-2 VIEW COMPARISON:  05/26/2020, 04/24/2020 FINDINGS: Single frontal view of the pelvis demonstrates partial visualization of a right hip arthroplasty. No acute displaced fractures. Stable sclerosis of the right side pubic symphysis. Stable spondylosis at the lumbosacral junction. Diffuse vascular calcifications unchanged. IMPRESSION: 1. Stable appearance of the pelvis.  No acute fractures. Electronically Signed   By: Randa Ngo M.D.   On: 06/02/2020 20:48   CT Head Wo Contrast  Result Date: 05/19/2020 CLINICAL DATA:  Fall trauma. Patient tripped in the parking lot. Hit head and has laceration above by bra. EXAM: CT HEAD WITHOUT CONTRAST CT CERVICAL SPINE WITHOUT CONTRAST TECHNIQUE: Multidetector CT imaging of the head and cervical spine was performed following the standard protocol without intravenous contrast. Multiplanar CT image reconstructions of the cervical spine were also generated. COMPARISON:  None. FINDINGS: CT HEAD FINDINGS Brain: There is central and cortical atrophy. Periventricular white matter changes are consistent with small vessel disease. There is no intra or extra-axial fluid collection or mass lesion. The basilar cisterns and ventricles have a normal appearance. There is no CT evidence for acute infarction or hemorrhage. Vascular: . No hyperdense vessels. There is atherosclerotic calcification of the internal carotid arteries Skull: Normal. Negative for fracture or  focal lesion. Sinuses/Orbits: No acute finding. Other: There is LEFT frontal scalp edema in the supraorbital region. No associated underlying fracture. CT CERVICAL SPINE FINDINGS Alignment: Normal alignment. Skull base and vertebrae: No acute fracture. No primary bone lesion or focal pathologic process. Soft tissues and spinal canal: No prevertebral fluid or swelling. No visible canal hematoma. Disc levels: Moderate disc height loss notably at C3-4, C4-5, C5-6, and C6-7. Upper chest: Negative. Other: None IMPRESSION: 1. No evidence for acute intracranial abnormality. 2. Atrophy and small vessel disease. 3. LEFT frontal scalp edema in the supraorbital region. 4. No evidence for  acute cervical spine abnormality. 5. Moderate mid cervical degenerative changes. Electronically Signed   By: Nolon Nations M.D.   On: 05/19/2020 18:53   CT ANGIO CHEST PE W OR WO CONTRAST  Result Date: 06/02/2020 CLINICAL DATA:  Hypoxia, dyspnea EXAM: CT ANGIOGRAPHY CHEST WITH CONTRAST TECHNIQUE: Multidetector CT imaging of the chest was performed using the standard protocol during bolus administration of intravenous contrast. Multiplanar CT image reconstructions and MIPs were obtained to evaluate the vascular anatomy. CONTRAST:  29mL OMNIPAQUE IOHEXOL 350 MG/ML SOLN COMPARISON:  04/24/2020 FINDINGS: Cardiovascular: There is excellent opacification of the pulmonary arterial tree. There is no intraluminal filling defect identified to suggest acute pulmonary embolism. The central pulmonary arteries are of normal caliber. Moderate to severe multi-vessel coronary artery calcification. Global cardiac size is within normal limits. No pericardial effusion. There is extensive atherosclerotic calcification within the thoracic aorta. No aortic aneurysm. Mediastinum/Nodes: Thyroid unremarkable. No pathologic thoracic adenopathy. Esophagus unremarkable Lungs/Pleura: Mild centrilobular emphysema. Complete collapse of the right middle lobe has  developed. A central obstructing lesion is not identified, however. There has developed moderate bilateral pleural effusions, left greater than right, with subtotal collapse of the lower lobes bilaterally, left greater than right. Superimposed focal consolidation is seen within the basilar lingula, likely infectious or inflammatory in the acute setting. No pneumothorax. Upper Abdomen: No acute abnormality. Musculoskeletal: There has developed extensive dependent subcutaneous body wall edema within the posterior thoracoabdominal wall, new since prior examination, nonspecific. This could reflect dependent edema in the setting of anasarca, or by a local inflammatory process such as cellulitis. No acute bone abnormality. Review of the MIP images confirms the above findings. IMPRESSION: No pulmonary embolism. Moderate to severe multi-vessel coronary artery calcification. Interval development of complete right middle lobe collapse and subtotal collapse of the lower lobes bilaterally, likely passive related to new moderate bilateral pleural effusions. Focal pulmonary infiltrate within the basilar lingula, likely infectious in the acute setting. Aspiration could appear similarly. Dependent subcutaneous body wall edema within the posterior thoracoabdominal wall, new since prior examination. Correlation with clinical examination would be helpful to exclude a local inflammatory process such as cellulitis. Alternatively, this could be seen in the setting of anasarca and resultant dependent body wall edema. Aortic Atherosclerosis (ICD10-I70.0) and Emphysema (ICD10-J43.9). Electronically Signed   By: Fidela Salisbury MD   On: 06/02/2020 23:33   CT Cervical Spine Wo Contrast  Result Date: 05/19/2020 CLINICAL DATA:  Fall trauma. Patient tripped in the parking lot. Hit head and has laceration above by bra. EXAM: CT HEAD WITHOUT CONTRAST CT CERVICAL SPINE WITHOUT CONTRAST TECHNIQUE: Multidetector CT imaging of the head and  cervical spine was performed following the standard protocol without intravenous contrast. Multiplanar CT image reconstructions of the cervical spine were also generated. COMPARISON:  None. FINDINGS: CT HEAD FINDINGS Brain: There is central and cortical atrophy. Periventricular white matter changes are consistent with small vessel disease. There is no intra or extra-axial fluid collection or mass lesion. The basilar cisterns and ventricles have a normal appearance. There is no CT evidence for acute infarction or hemorrhage. Vascular: . No hyperdense vessels. There is atherosclerotic calcification of the internal carotid arteries Skull: Normal. Negative for fracture or focal lesion. Sinuses/Orbits: No acute finding. Other: There is LEFT frontal scalp edema in the supraorbital region. No associated underlying fracture. CT CERVICAL SPINE FINDINGS Alignment: Normal alignment. Skull base and vertebrae: No acute fracture. No primary bone lesion or focal pathologic process. Soft tissues and spinal canal: No prevertebral fluid or swelling. No  visible canal hematoma. Disc levels: Moderate disc height loss notably at C3-4, C4-5, C5-6, and C6-7. Upper chest: Negative. Other: None IMPRESSION: 1. No evidence for acute intracranial abnormality. 2. Atrophy and small vessel disease. 3. LEFT frontal scalp edema in the supraorbital region. 4. No evidence for acute cervical spine abnormality. 5. Moderate mid cervical degenerative changes. Electronically Signed   By: Nolon Nations M.D.   On: 05/19/2020 18:53   CT FEMUR RIGHT WO CONTRAST  Result Date: 05/26/2020 CLINICAL DATA:  Right thigh hematoma. EXAM: CT OF THE LOWER RIGHT EXTREMITY WITHOUT CONTRAST TECHNIQUE: Multidetector CT imaging of the right lower extremity was performed according to the standard protocol. COMPARISON:  CT chest, abdomen, and pelvis dated April 24, 2020. FINDINGS: Bones/Joint/Cartilage No acute fracture or dislocation. Subacute, healing fractures of  right pubic bone and right inferior pubic ramus with associated sclerosis. Prior right total hip arthroplasty. No evidence of hardware failure or loosening. No joint effusion. Mild degenerative changes of the right knee. Osteopenia Ligaments Ligaments are suboptimally evaluated by CT. Muscles and Tendons Grossly intact. Soft tissue Somewhat ill-defined mixed density superficial fluid collection in the right lateral hip overlying the greater trochanter, measuring approximately 6.5 x 5.3 x 13.9 cm. Extensive circumferential soft tissue swelling of the right thigh. Partially visualized prominent soft tissue edema in the medial left thigh. No soft tissue mass. IMPRESSION: 1. Superficial 6.5 x 5.3 x 13.9 cm hematoma in the right lateral hip overlying the greater trochanter. 2. No acute osseous abnormality. Subacute, healing fractures of the right pubic bone and right inferior pubic ramus. 3. Prior right total hip arthroplasty without evidence of hardware complication. Electronically Signed   By: Titus Dubin M.D.   On: 05/26/2020 10:39   DG Chest Port 1 View  Result Date: 06/04/2020 CLINICAL DATA:  74 year old female with a history of thoracentesis EXAM: PORTABLE CHEST 1 VIEW COMPARISON:  CT 06/02/2020 FINDINGS: Cardiomediastinal silhouette within normal limits. No evidence of interlobular septal thickening. Coarsened interstitial markings throughout the lungs. Blunting of the bilateral costophrenic angles compatible with small bilateral pleural effusions. No pneumothorax. No acute displaced fracture. IMPRESSION: No complicating features status post left thoracentesis. Electronically Signed   By: Corrie Mckusick D.O.   On: 06/04/2020 12:16   DG Hand Complete Right  Result Date: 05/24/2020 CLINICAL DATA:  Pain and bruising in the right hand involving the thumb, ring finger and little finger following a fall 4 days ago. EXAM: RIGHT HAND - COMPLETE 3+ VIEW COMPARISON:  None. FINDINGS: Nondisplaced fracture across  the proximal aspect of the 1st distal phalanx, without intra-articular involvement, displacement or angulation. Mildly impacted fracture of the proximal aspect of the 5th distal phalanx with mild ventral displacement of the distal fragment. This has intra-articular extension. No additional fractures are seen and no dislocations are demonstrated. Moderate 1st IP joint degenerative changes. Minimal 1st MCP joint degenerative changes. IMPRESSION: Fractures of the 1st and 5th distal phalanges, as described above. These results will be called to the ordering clinician or representative by the Radiologist Assistant, and communication documented in the PACS or [employer]. Electronically Signed   By: Claudie Revering M.D.   On: 05/24/2020 15:36   ECHOCARDIOGRAM COMPLETE  Result Date: 05/28/2020    ECHOCARDIOGRAM REPORT   Patient Name:   SALVATORE POE Date of Exam: 05/27/2020 Medical Rec #:  [id number]     Height:       61.0 in Accession #:    [id number]    Weight:  132.3 lb Date of Birth:  1945/08/18      BSA:          1.584 m Patient Age:    65 years      BP:           136/60 mmHg Patient Gender: F             HR:           102 bpm. Exam Location:  [facility] Procedure: 2D Echo, Cardiac Doppler and Color Doppler Indications:     CHF-acute diastolic 992.42  History:         Patient has no prior history of Echocardiogram examinations.                  Stroke; Signs/Symptoms:Syncope.  Sonographer:     Sherrie Sport RDCS (AE) Referring Phys:  [id number] [facility] AMIN Diagnosing Phys: Nelva Bush MD IMPRESSIONS  1. Left ventricular ejection fraction, by estimation, is 60 to 65%. The left ventricle has normal function. The left ventricle has no regional wall motion abnormalities. There is mild left ventricular hypertrophy. Left ventricular diastolic parameters are consistent with Grade I diastolic dysfunction (impaired relaxation).  2. Right ventricular systolic function is normal. The right ventricular size is normal. There is  moderately elevated pulmonary artery systolic pressure.  3. Question left pleural effusion. Consider dedicated chest imaging for further evaluation.  4. The mitral valve is degenerative. Mild mitral valve regurgitation.  5. Tricuspid valve regurgitation is mild to moderate.  6. The aortic valve was not well visualized. Aortic valve regurgitation is mild. Mild to moderate aortic valve sclerosis/calcification is present, without any evidence of aortic stenosis.  7. The inferior vena cava is normal in size with greater than 50% respiratory variability, suggesting right atrial pressure of 3 mmHg. FINDINGS  Left Ventricle: Left ventricular ejection fraction, by estimation, is 60 to 65%. The left ventricle has normal function. The left ventricle has no regional wall motion abnormalities. The left ventricular internal cavity size was normal in size. There is  mild left ventricular hypertrophy. Left ventricular diastolic parameters are consistent with Grade I diastolic dysfunction (impaired relaxation). Right Ventricle: The right ventricular size is normal. No increase in right ventricular wall thickness. Right ventricular systolic function is normal. There is moderately elevated pulmonary artery systolic pressure. The tricuspid regurgitant velocity is 3.50 m/s, and with an assumed right atrial pressure of 3 mmHg, the estimated right ventricular systolic pressure is 22.2 mmHg. Left Atrium: Left atrial size was normal in size. Right Atrium: Right atrial size was normal in size. Pericardium: Question left pleural effusion. Consider dedicated chest imaging for further evaluation. There is no evidence of pericardial effusion. Presence of pericardial fat pad. Mitral Valve: The mitral valve is degenerative in appearance. There is mild thickening of the mitral valve leaflet(s). There is mild calcification of the mitral valve leaflet(s). Mild mitral valve regurgitation. Tricuspid Valve: The tricuspid valve is normal in structure.  Tricuspid valve regurgitation is mild to moderate. Aortic Valve: The aortic valve was not well visualized. Aortic valve regurgitation is mild. Mild to moderate aortic valve sclerosis/calcification is present, without any evidence of aortic stenosis. Aortic valve mean gradient measures 7.5 mmHg. Aortic valve peak gradient measures 15.1 mmHg. Aortic valve area, by VTI measures 1.79 cm. Pulmonic Valve: The pulmonic valve was not well visualized. Pulmonic valve regurgitation is not visualized. No evidence of pulmonic stenosis. Aorta: The aortic root is normal in size and structure. Pulmonary Artery: The pulmonary artery is of  normal size. Venous: The inferior vena cava is normal in size with greater than 50% respiratory variability, suggesting right atrial pressure of 3 mmHg. IAS/Shunts: No atrial level shunt detected by color flow Doppler.  LEFT VENTRICLE PLAX 2D LVIDd:         3.58 cm  Diastology LVIDs:         2.55 cm  LV e' medial:    6.74 cm/s LV PW:         1.27 cm  LV E/e' medial:  12.6 LV IVS:        1.21 cm  LV e' lateral:   6.42 cm/s LVOT diam:     2.00 cm  LV E/e' lateral: 13.2 LV SV:         74 LV SV Index:   46 LVOT Area:     3.14 cm  RIGHT VENTRICLE RV S prime:     11.00 cm/s TAPSE (M-mode): 3.3 cm LEFT ATRIUM             Index       RIGHT ATRIUM           Index LA diam:        3.50 cm 2.21 cm/m  RA Area:     14.30 cm LA Vol (A2C):   53.1 ml 33.51 ml/m RA Volume:   31.60 ml  19.94 ml/m LA Vol (A4C):   41.1 ml 25.94 ml/m LA Biplane Vol: 47.8 ml 30.17 ml/m  AORTIC VALVE                    PULMONIC VALVE AV Area (Vmax):    1.36 cm     PV Vmax:        0.75 m/s AV Area (Vmean):   1.57 cm     PV Peak grad:   2.2 mmHg AV Area (VTI):     1.79 cm     RVOT Peak grad: 3 mmHg AV Vmax:           194.50 cm/s AV Vmean:          125.500 cm/s AV VTI:            0.410 m AV Peak Grad:      15.1 mmHg AV Mean Grad:      7.5 mmHg LVOT Vmax:         84.20 cm/s LVOT Vmean:        62.800 cm/s LVOT VTI:          0.234 m  LVOT/AV VTI ratio: 0.57  AORTA Ao Root diam: 3.10 cm MITRAL VALVE               TRICUSPID VALVE MV Area (PHT): 5.13 cm    TR Peak grad:   49.0 mmHg MV Decel Time: 148 msec    TR Vmax:        350.00 cm/s MV E velocity: 84.80 cm/s MV A velocity: 97.70 cm/s  SHUNTS MV E/A ratio:  0.87        Systemic VTI:  0.23 m                            Systemic Diam: 2.00 cm Nelva Bush MD Electronically signed by Nelva Bush MD Signature Date/Time: 05/28/2020/8:51:55 AM    Final    US THORACENTESIS ASP PLEURAL SPACE W/IMG GUIDE  Result Date: 06/04/2020 INDICATION: Patient with history of COPD, acute on chronic hypoxic respiratory  failure, new onset bilateral pleural effusions. Request to IR for diagnostic and therapeutic thoracentesis. EXAM: ULTRASOUND GUIDED LEFT THORACENTESIS MEDICATIONS: 10 mL 1% lidocaine COMPLICATIONS: None immediate. PROCEDURE: An ultrasound guided thoracentesis was thoroughly discussed with the patient and questions answered. The benefits, risks, alternatives and complications were also discussed. The patient understands and wishes to proceed with the procedure. Written consent was obtained. Ultrasound was performed to localize and mark an adequate pocket of fluid in the left chest. The area was then prepped and draped in the normal sterile fashion. 1% Lidocaine was used for local anesthesia. Under ultrasound guidance a 6 Fr Safe-T-Centesis catheter was introduced. Thoracentesis was performed. The catheter was removed and a dressing applied. FINDINGS: A total of approximately 600 mL of clear yellow fluid was removed. Samples were sent to the laboratory as requested by the clinical team. IMPRESSION: Successful ultrasound guided left thoracentesis yielding 600 mL of pleural fluid. Read by Candiss Norse, PA-C Electronically Signed   By: Markus Daft M.D.   On: 06/04/2020 12:13    Microbiology: Recent Results (from the past 240 hour(s))  Respiratory Panel by RT PCR (Flu A&B, Covid) -  Nasopharyngeal Swab     Status: None   Collection Time: 06/02/20 10:01 PM   Specimen: Nasopharyngeal Swab  Result Value Ref Range Status   SARS Coronavirus 2 by RT PCR NEGATIVE NEGATIVE Final    Comment: (NOTE) SARS-CoV-2 target nucleic acids are NOT DETECTED.  The SARS-CoV-2 RNA is generally detectable in upper respiratoy specimens during the acute phase of infection. The lowest concentration of SARS-CoV-2 viral copies this assay can detect is 131 copies/mL. A negative result does not preclude SARS-Cov-2 infection and should not be used as the sole basis for treatment or other patient management decisions. A negative result may occur with  improper specimen collection/handling, submission of specimen other than nasopharyngeal swab, presence of viral mutation(s) within the areas targeted by this assay, and inadequate number of viral copies (<131 copies/mL). A negative result must be combined with clinical observations, patient history, and epidemiological information. The expected result is Negative.  Fact Sheet for Patients:  PinkCheek.be  Fact Sheet for Healthcare Providers:  GravelBags.it  This test is no t yet approved or cleared by the [place] FDA and  has been authorized for detection and/or diagnosis of SARS-CoV-2 by FDA under an Emergency Use Authorization (EUA). This EUA will remain  in effect (meaning this test can be used) for the duration of the COVID-19 declaration under Section 564(b)(1) of the Act, 21 U.S.C. section 360bbb-3(b)(1), unless the authorization is terminated or revoked sooner.     Influenza A by PCR NEGATIVE NEGATIVE Final   Influenza B by PCR NEGATIVE NEGATIVE Final    Comment: (NOTE) The Xpert Xpress SARS-CoV-2/FLU/RSV assay is intended as an aid in  the diagnosis of influenza from Nasopharyngeal swab specimens and  should not be used as a sole basis for treatment. Nasal washings and   aspirates are unacceptable for Xpert Xpress SARS-CoV-2/FLU/RSV  testing.  Fact Sheet for Patients: PinkCheek.be  Fact Sheet for Healthcare Providers: GravelBags.it  This test is not yet approved or cleared by the [place] FDA and  has been authorized for detection and/or diagnosis of SARS-CoV-2 by  FDA under an Emergency Use Authorization (EUA). This EUA will remain  in effect (meaning this test can be used) for the duration of the  Covid-19 declaration under Section 564(b)(1) of the Act, 21  U.S.C. section 360bbb-3(b)(1), unless the authorization is  terminated  or revoked. Performed at [facility], [address] [place], [place] [address]   Body fluid culture     Status: None (Preliminary result)   Collection Time: 06/04/20 11:28 AM   Specimen: PATH Cytology Pleural fluid  Result Value Ref Range Status   Specimen Description   Final    PLEURAL Performed at [facility], [address], [place], [place] [address]    Special Requests   Final    NONE Performed at [facility], [address] [place], [place] [address]    Gram Stain   Final    RARE WBC PRESENT, PREDOMINANTLY MONONUCLEAR NO ORGANISMS SEEN    Culture   Final    NO GROWTH 2 DAYS Performed at [facility], [place] [address], [place], [place] [address]    Report Status PENDING  Incomplete  SARS Coronavirus 2 by RT PCR (hospital order, performed in [facility] hospital lab) Nasopharyngeal Nasopharyngeal Swab     Status: None   Collection Time: 06/05/20  6:36 PM   Specimen: Nasopharyngeal Swab  Result Value Ref Range Status   SARS Coronavirus 2 NEGATIVE NEGATIVE Final    Comment: (NOTE) SARS-CoV-2 target nucleic acids are NOT DETECTED.  The SARS-CoV-2 RNA is generally detectable in upper and lower respiratory specimens during the acute phase of infection. The lowest concentration of SARS-CoV-2 viral copies this  assay can detect is 250 copies / mL. A negative result does not preclude SARS-CoV-2 infection and should not be used as the sole basis for treatment or other patient management decisions.  A negative result may occur with improper specimen collection / handling, submission of specimen other than nasopharyngeal swab, presence of viral mutation(s) within the areas targeted by this assay, and inadequate number of viral copies (<250 copies / mL). A negative result must be combined with clinical observations, patient history, and epidemiological information.  Fact Sheet for Patients:   StrictlyIdeas.no  Fact Sheet for Healthcare Providers: BankingDealers.co.za  This test is not yet approved or  cleared by the [place] FDA and has been authorized for detection and/or diagnosis of SARS-CoV-2 by FDA under an Emergency Use Authorization (EUA).  This EUA will remain in effect (meaning this test can be used) for the duration of the COVID-19 declaration under Section 564(b)(1) of the Act, 21 U.S.C. section 360bbb-3(b)(1), unless the authorization is terminated or revoked sooner.  Performed at [facility], [address] [place], [place] [address]      Labs: Basic Metabolic Panel: Recent Labs  Lab 06/02/20 1613 06/02/20 1747 06/03/20 1905 06/04/20 0412 06/05/20 0906 06/06/20 0705  NA 138  --  138 137 136 136  K 3.9  --  3.7 3.4* 3.3* 3.1*  CL 101  --  96* 97* 92* 94*  CO2 29  --  26 30 34* 30  GLUCOSE 83  --  151* 86 99 88  BUN 14  --  13 12 14 18   CREATININE 0.79  --  1.02* 1.02* 1.12* 1.15*  CALCIUM 8.0*  --  8.2* 7.7* 8.8* 8.5*  MG  --  1.8 1.9 1.8  --  2.0  PHOS  --  3.7 4.2  --   --   --    Liver Function Tests: Recent Labs  Lab 06/02/20 1747 06/03/20 1905 06/05/20 0906 06/06/20 0705  AST 24 25 24 23   ALT 20 22 20 18   ALKPHOS 129* 152* 126 114  BILITOT 0.9 1.1 0.7 0.6  PROT  4.5* 5.4* 5.1* 4.6*   ALBUMIN 2.2* 2.7* 2.6* 2.3*   No results for input(s): LIPASE, AMYLASE in the last 168 hours. No results for input(s): AMMONIA in the last 168 hours. CBC: Recent Labs  Lab 06/02/20 1747 06/02/20 1747 06/03/20 0501 06/03/20 1905 06/04/20 0412 06/05/20 0906 06/06/20 0705  WBC 4.8  --  4.6  --  3.3* 4.2 3.5*  NEUTROABS 3.7  --   --   --   --  2.9 2.2  HGB 6.9*   < > 6.3* 11.9* 10.2* 10.6* 10.3*  HCT 21.8*   < > 19.9* 35.9* 31.5* 32.8* 30.9*  MCV 93.6  --  94.3  --  91.8 93.2 92.0  PLT 154  --  156  --  153 184 185   < > = values in this interval not displayed.   Cardiac Enzymes: No results for input(s): CKTOTAL, CKMB, CKMBINDEX, TROPONINI in the last 168 hours. BNP: BNP (last 3 results) Recent Labs    03/20/20 1351 05/26/20 0518 06/02/20 1616  BNP 352.1* 554.7* 616.8*    ProBNP (last 3 results) No results for input(s): PROBNP in the last 8760 hours.  CBG: No results for input(s): GLUCAP in the last 168 hours.     Signed:  Nita Sells MD   [facility] 06/06/2020, 11:25 AM

## 2020-06-06 NOTE — Plan of Care (Signed)

## 2020-06-06 NOTE — Progress Notes (Signed)
Mobility Specialist - Progress Note   06/06/20 1500  Mobility  Activity Transferred to/from [facility];Transferred:  Chair to bed  Level of Assistance Standby assist, set-up cues, supervision of patient - no hands on  Assistive Device Front wheel walker  Distance Ambulated (ft) 15 ft  Mobility Response Tolerated well  Mobility performed by Mobility specialist  $Mobility charge 1 Mobility    Pre-mobility: 96 HR, 93% SpO2 Post-mobility: 82 HR, 96% SpO2   Pt was sitting in recliner upon arrival. Pt agreed to session. Pt denied any pain, nausea, or fatigue. Pt stood to RW with supervision. Pt ambulated to [facility] where she had large BM. Pt c/o slight SOB. No heavy breathing noted. O2 > 92% throughout session, however not sure of accuracy d/t poor pleth. Pt brushed teeth while sitting on BSC. Overall, pt tolerated session well. Pt returned to bedside with minA return to supine. Pt has all needs in reach and alarm set. Nurse notified.   Kathee Delton Mobility Specialist 06/06/20, 3:13 PM

## 2020-06-06 NOTE — TOC Progression Note (Signed)
Transition of Care [facility]) - Progression Note    Patient Details  Name: Mariah [name] MRN: [id number] Date of Birth: Jan 13, 1946  Transition of Care [facility]) CM/SW Contact  Eileen Stanford, LCSW Phone Number: 06/06/2020, 1:45 PM  Clinical Narrative:   [facility] backed out of taking pt. CSW provided pt's niece and pt with alternative offers. Pt is agreeable to [facility]. [facility] will [facility] however, now that pt has chosen [facility] has been started--auth can not be started before facility has accepted and pt has chosen. Although it has been started, [facility] will not be decided until tomorrow. Pt aware. MD aware. Facility aware. Plan for d/c to [facility] tomorrow after [facility] is obtained.    Expected Discharge Plan: [facility] Barriers to Discharge: Continued Medical Work up  Expected Discharge Plan and Services Expected Discharge Plan: [place] Choice: [facility] arrangements for the past 2 months: Single Family Home Expected Discharge Date: 06/06/20                                     Social Determinants of Health (SDOH) Interventions    Readmission Risk Interventions No flowsheet data found.

## 2020-06-07 DIAGNOSIS — R531 Weakness: Secondary | ICD-10-CM

## 2020-06-07 DIAGNOSIS — E876 Hypokalemia: Secondary | ICD-10-CM | POA: Diagnosis not present

## 2020-06-07 DIAGNOSIS — J449 Chronic obstructive pulmonary disease, unspecified: Secondary | ICD-10-CM | POA: Diagnosis not present

## 2020-06-07 DIAGNOSIS — F0150 Vascular dementia without behavioral disturbance: Secondary | ICD-10-CM | POA: Diagnosis not present

## 2020-06-07 DIAGNOSIS — S6291XS Unspecified fracture of right wrist and hand, sequela: Secondary | ICD-10-CM | POA: Diagnosis not present

## 2020-06-07 DIAGNOSIS — S7011XD Contusion of right thigh, subsequent encounter: Secondary | ICD-10-CM | POA: Diagnosis not present

## 2020-06-07 DIAGNOSIS — G2581 Restless legs syndrome: Secondary | ICD-10-CM | POA: Diagnosis not present

## 2020-06-07 DIAGNOSIS — M25551 Pain in right hip: Secondary | ICD-10-CM | POA: Diagnosis not present

## 2020-06-07 DIAGNOSIS — E785 Hyperlipidemia, unspecified: Secondary | ICD-10-CM | POA: Diagnosis not present

## 2020-06-07 DIAGNOSIS — R52 Pain, unspecified: Secondary | ICD-10-CM | POA: Diagnosis not present

## 2020-06-07 DIAGNOSIS — D649 Anemia, unspecified: Secondary | ICD-10-CM | POA: Diagnosis not present

## 2020-06-07 DIAGNOSIS — I129 Hypertensive chronic kidney disease with stage 1 through stage 4 chronic kidney disease, or unspecified chronic kidney disease: Secondary | ICD-10-CM | POA: Diagnosis not present

## 2020-06-07 DIAGNOSIS — J90 Pleural effusion, not elsewhere classified: Secondary | ICD-10-CM | POA: Diagnosis not present

## 2020-06-07 DIAGNOSIS — E43 Unspecified severe protein-calorie malnutrition: Secondary | ICD-10-CM | POA: Diagnosis not present

## 2020-06-07 DIAGNOSIS — S7001XS Contusion of right hip, sequela: Secondary | ICD-10-CM | POA: Diagnosis not present

## 2020-06-07 DIAGNOSIS — S32591A Other specified fracture of right pubis, initial encounter for closed fracture: Secondary | ICD-10-CM | POA: Diagnosis not present

## 2020-06-07 DIAGNOSIS — M6281 Muscle weakness (generalized): Secondary | ICD-10-CM | POA: Diagnosis not present

## 2020-06-07 DIAGNOSIS — I5033 Acute on chronic diastolic (congestive) heart failure: Secondary | ICD-10-CM | POA: Diagnosis not present

## 2020-06-07 DIAGNOSIS — I5032 Chronic diastolic (congestive) heart failure: Secondary | ICD-10-CM | POA: Diagnosis not present

## 2020-06-07 DIAGNOSIS — S7011XA Contusion of right thigh, initial encounter: Secondary | ICD-10-CM | POA: Diagnosis not present

## 2020-06-07 DIAGNOSIS — I10 Essential (primary) hypertension: Secondary | ICD-10-CM | POA: Diagnosis not present

## 2020-06-07 DIAGNOSIS — E569 Vitamin deficiency, unspecified: Secondary | ICD-10-CM | POA: Diagnosis not present

## 2020-06-07 DIAGNOSIS — L409 Psoriasis, unspecified: Secondary | ICD-10-CM | POA: Diagnosis not present

## 2020-06-07 DIAGNOSIS — R488 Other symbolic dysfunctions: Secondary | ICD-10-CM | POA: Diagnosis not present

## 2020-06-07 DIAGNOSIS — M79651 Pain in right thigh: Secondary | ICD-10-CM | POA: Diagnosis not present

## 2020-06-07 DIAGNOSIS — R7989 Other specified abnormal findings of blood chemistry: Secondary | ICD-10-CM | POA: Diagnosis not present

## 2020-06-07 DIAGNOSIS — K219 Gastro-esophageal reflux disease without esophagitis: Secondary | ICD-10-CM | POA: Diagnosis not present

## 2020-06-07 DIAGNOSIS — Z96641 Presence of right artificial hip joint: Secondary | ICD-10-CM | POA: Diagnosis not present

## 2020-06-07 DIAGNOSIS — R600 Localized edema: Secondary | ICD-10-CM | POA: Diagnosis not present

## 2020-06-07 DIAGNOSIS — R498 Other voice and resonance disorders: Secondary | ICD-10-CM | POA: Diagnosis not present

## 2020-06-07 DIAGNOSIS — I82409 Acute embolism and thrombosis of unspecified deep veins of unspecified lower extremity: Secondary | ICD-10-CM | POA: Diagnosis not present

## 2020-06-07 DIAGNOSIS — R0600 Dyspnea, unspecified: Secondary | ICD-10-CM | POA: Diagnosis not present

## 2020-06-07 DIAGNOSIS — E611 Iron deficiency: Secondary | ICD-10-CM | POA: Diagnosis not present

## 2020-06-07 DIAGNOSIS — I071 Rheumatic tricuspid insufficiency: Secondary | ICD-10-CM | POA: Diagnosis not present

## 2020-06-07 DIAGNOSIS — I5043 Acute on chronic combined systolic (congestive) and diastolic (congestive) heart failure: Secondary | ICD-10-CM | POA: Diagnosis not present

## 2020-06-07 DIAGNOSIS — I82442 Acute embolism and thrombosis of left tibial vein: Secondary | ICD-10-CM | POA: Diagnosis not present

## 2020-06-07 DIAGNOSIS — N183 Chronic kidney disease, stage 3 unspecified: Secondary | ICD-10-CM | POA: Diagnosis not present

## 2020-06-07 DIAGNOSIS — J189 Pneumonia, unspecified organism: Secondary | ICD-10-CM | POA: Diagnosis not present

## 2020-06-07 DIAGNOSIS — J9601 Acute respiratory failure with hypoxia: Secondary | ICD-10-CM | POA: Diagnosis not present

## 2020-06-07 DIAGNOSIS — N1832 Chronic kidney disease, stage 3b: Secondary | ICD-10-CM | POA: Diagnosis not present

## 2020-06-07 LAB — BODY FLUID CULTURE: Culture: NO GROWTH

## 2020-06-07 NOTE — Discharge Summary (Signed)
Physician Discharge Summary  Mariah [name] NIO:[id number] DOB: 1945-11-02 DOA: 06/02/2020  Patient seen and examined and reviewed She is doing well I reviewed her hematoma on her right leg which is slowly resolving and is now a faint bruise which is light purple-she can resume her apixaban probably on 11/21 as per my note below but I would hold her con commitment aspirin for the time being She is stabilized for discharge to skilled facility  PCP: [name], MD  Admit date: 06/02/2020 Discharge date: 06/07/2020  Time spent: 43 minutes  Recommendations for Outpatient Follow-up:  1. Will need Chem-12 CBC in about 1 week 2. Needs chest x-ray in about 1 week and follow-up with Dr. Milas Hock of pulmonology to determine etiology effusions 3. Recommend recheck of potassium in the outpatient setting and replacement 4. Limited prescription of tramadol given on discharge 5. Follow-up in the outpatient setting with orthopedics Dr.Poggi to be arranged by skilled facility 6. New medications Lasix and Levaquin given this admission  Discharge Diagnoses:  Active Problems:   Compulsive tobacco user syndrome   Chronic obstructive pulmonary disease (HCC)   CKD (chronic kidney disease), stage III (HCC)   Acute respiratory failure with hypoxia (HCC)   CAP (community acquired pneumonia)   Hypoalbuminemia   Cutaneous horn   Pleural effusion   Acute on chronic diastolic CHF (congestive heart failure) (HCC)   Protein-calorie malnutrition, severe   Discharge Condition: Improved  Diet recommendation: Heart healthy fluid limited to 1200 cc  Filed Weights   06/05/20 0300 06/06/20 0557 06/07/20 0423  Weight: 58 kg 58.5 kg 56 kg    History of present illness:  74 year old white female Recent diagnosis DVT about 05/08/2020 on Eliquis HTN COPD on 2 L oxygen at home AAA Psoriasis CKD 3 Reflux Tobacco abuse Recent admission 05/26/2020 through 05/28/2020 found to have hemoglobin 6.8 secondary to  large right thigh hematoma with fall-she also had a finger fracture at the time orthopedics-recommended holding Eliquis/aspirin found to have left pleural effusion at the time and received IV Lasix  Readmitted with shortness of breath 11/14-found to have new worsening pleural effusion possible Aspiration Pneumonia and Was Started on CAP coverage Pulmonology saw the patient and patient underwent thoracentesis 600 cc clear yellow fluid   Hospital Course:  1. Acute hypoxic respiratory failure 2. Right-sided pleural effusion 3. ?  Community-acquired pneumonia 4. Acute superimposed on chronic diastolic heart failure echo 05/27/2020 EF 60-65% a. Respiratory failure secondary to effusion status post tap by IR 11/16 does not look infectious and does not seem to have nucleated cells above 10,000 therefore this is unlikely to be an infectious etiology b. Paucity findings re: pneumonia, de-escalating to p.o. Levaquin to continue for 5 days as per pulmonology on discharge c. repeat two-view x-ray 11/17 does not show recurrence d. Continue Lasix 40 p.o. daily, -2.7 L since admit-weight inaccurate at 58 e. Pulmonology will work-up patient's recurrent effusions in the outpatient setting and already are aware of patient to follow-up in the outpatient setting for this work-up 5. DVT 81/8299 complicated by right thigh hematoma a. Continue to hold anticoagulation for the time being likely can resume in the outpatient setting b. Would hold aspirin on discharge and only resume as an outpatient if absolutely necessary 6. Mild hypokalemia a. Replacing on discharge with K. Dur 40 7. Anemia of blood loss secondary to hematoma a. Saturation ratios are 12 but iron is 32 b. Outpatient recheck and consider IV iron 8. First fifth distal phalange fractures  a. appreciate Dr. Roland Rack input-does not appear to need formal splinting left arm-right little finger splinting was replaced by them 11/16 b. Outpatient follow-up with  orthopedics 9. HTN a. Moderately controlled at this time, continue metoprolol 50 XL daily 10. Continued smoker and COPD on 2 L oxygen at baseline a. Patient encouraged cessation continue chronic oxygen b. Continue Breo Ellipta 1 puff as well as albuterol c. Continue Mucinex 600 twice daily d. Solu-Medrol discontinued as no significant wheezing-see MAR for dosing as per pulmonology 11. Psoriasis a. Resume methotrexate 15 weekly on discharge, can continue gabapentin 300 twice daily, calcipotriene 0.005% as per orders in addition to clobetasol 12. Hyperlipidemia a. Continue Zocor 20 q. other day 13. Reflux a. Continue pantoprazole 20 twice daily   Consultations:  Pulmonology  Discharge Exam: Vitals:   06/07/20 0423 06/07/20 0754  BP: 120/75 120/71  Pulse: 83 72  Resp: 18 18  Temp: 97.7 F (36.5 C) 97.9 F (36.6 C)  SpO2: 100% 100%    General: Awake alert coherent no distress EOMI NCAT no focal deficit still feels short of breath overall but is improved from prior Cardiovascular: S1-S2 no murmur no rub no gallop in A. fib on monitors Respiratory: Mild wheeze no rales no rhonchi no added sound no TVR no TVF  Discharge Instructions   Discharge Instructions    Diet - low sodium heart healthy   Complete by: As directed    Discharge wound care:   Complete by: As directed    Wound care  Daily      Comments: AFTER applying Kenalog or Eucerin cream to excoriated areas, you can place Vaseline gauze over the sites and secure with Kerlex for extremity wounds, if this might be helpful to the patient in any way; otherwise, leave open to air.   Increase activity slowly   Complete by: As directed      Allergies as of 06/07/2020      Reactions   Clindamycin/lincomycin    diarrhea   Amoxicillin Diarrhea      Medication List    TAKE these medications   acetaminophen 325 MG tablet Commonly known as: TYLENOL Take 2 tablets (650 mg total) by mouth every 6 (six) hours as needed  for mild pain (or Fever >/= 101).   albuterol 108 (90 Base) MCG/ACT inhaler Commonly known as: VENTOLIN HFA INHALE 1-2 PUFFS INTO THE LUNGS EVERY 6 HOURS AS NEEDED FOR WHEEZING OR SHORTNESS OF BREATH.   apixaban 5 MG Tabs tablet Commonly known as: ELIQUIS Take 1 tablet (5 mg total) by mouth 2 (two) times daily. Start taking on: June 09, 2020 What changed: These instructions start on June 09, 2020. If you are unsure what to do until then, ask your doctor or other care provider.   ascorbic acid 250 MG tablet Commonly known as: VITAMIN C Take 1 tablet (250 mg total) by mouth 2 (two) times daily.   aspirin EC 81 MG tablet Take 81 mg by mouth daily. Swallow whole.   betamethasone dipropionate 0.05 % ointment Commonly known as: DIPROLENE Apply topically daily.   Breo Ellipta 100-25 MCG/INH Aepb Generic drug: fluticasone furoate-vilanterol Inhale 1 puff into the lungs daily.   calcipotriene 0.005 % cream Commonly known as: DOVONOX APPLY TO SKIN ONCE A DAY APPLY DAILY ON HANDS, FEET AND WRIST X 2 WEEKS, THEN USE 5 DAYS A WEEK   clobetasol 0.05 % external solution Commonly known as: TEMOVATE APPLY TO AFFECTED AREA TWICE A DAY   Cosentyx Sensoready (300  MG) 150 MG/ML Soaj Generic drug: Secukinumab (300 MG Dose) Inject 2 Syringes into the skin every 28 (twenty-eight) days.   donepezil 5 MG tablet Commonly known as: ARICEPT Take 5 mg by mouth at bedtime.   ferrous [id number] C-folic acid capsule Commonly known as: TRINSICON / FOLTRIN Take 1 capsule by mouth 2 (two) times daily after a meal.   ferrous sulfate 325 (65 FE) MG tablet Take 325 mg by mouth 2 (two) times daily.   furosemide 40 MG tablet Commonly known as: LASIX Take 1 tablet (40 mg total) by mouth daily.   gabapentin 300 MG capsule Commonly known as: NEURONTIN TAKE 1 CAPSULE BY MOUTH TWICE A DAY What changed: additional instructions   guaiFENesin 600 MG 12 hr tablet Commonly known as:  MUCINEX Take 1 tablet (600 mg total) by mouth 2 (two) times daily.   levocetirizine 5 MG tablet Commonly known as: XYZAL TAKE 1 TABLET BY MOUTH EVERY DAY IN THE EVENING What changed:   how much to take  how to take this   methotrexate 2.5 MG tablet Commonly known as: RHEUMATREX Take 15 mg by mouth once a week.   metoprolol succinate 50 MG 24 hr tablet Commonly known as: TOPROL-XL TAKE 1 TABLET (50 MG TOTAL) BY MOUTH DAILY. TAKE WITH OR IMMEDIATELY FOLLOWING A MEAL.   nicotine 7 mg/24hr patch Commonly known as: NICODERM CQ - dosed in mg/24 hr Place 1 patch (7 mg total) onto the skin daily.   pantoprazole 20 MG tablet Commonly known as: PROTONIX TAKE 1 TABLET (20 MG TOTAL) BY MOUTH 2 (TWO) TIMES DAILY BEFORE A MEAL.   potassium chloride SA 20 MEQ tablet Commonly known as: KLOR-CON Take 2 tablets (40 mEq total) by mouth daily.   predniSONE 20 MG tablet Commonly known as: DELTASONE Take 1 tablet (20 mg total) by mouth daily before breakfast.   rOPINIRole 1 MG tablet Commonly known as: REQUIP TAKE 1 TABLET (1 MG TOTAL) BY MOUTH AT BEDTIME.   simvastatin 20 MG tablet Commonly known as: ZOCOR TAKE 1 TABLET BY MOUTH EVERY DAY What changed: when to take this   traMADol 50 MG tablet Commonly known as: ULTRAM TAKE 1 TABLET BY MOUTH EVERY 6 HOURS AS NEEDED FOR MODERATE OR SEVERE PAIN What changed: See the new instructions.   Vitamin D3 50 MCG (2000 UT) Tabs Take 2,000 Units by mouth daily.            Discharge Care Instructions  (From admission, onward)         Start     Ordered   06/06/20 0000  Discharge wound care:       Comments: Wound care  Daily      Comments: AFTER applying Kenalog or Eucerin cream to excoriated areas, you can place Vaseline gauze over the sites and secure with Kerlex for extremity wounds, if this might be helpful to the patient in any way; otherwise, leave open to air.   06/06/20 1125         Allergies  Allergen Reactions  .  Clindamycin/Lincomycin     diarrhea  . Amoxicillin Diarrhea    Contact information for after-discharge care    Destination    [facility] Preferred SNF .   Service: Skilled Nursing Contact information: [address] [place] [phone]                   The results of significant diagnostics from this hospitalization (including imaging, microbiology,  ancillary and laboratory) are listed below for reference.    Significant Diagnostic Studies: DG Chest 2 View  Result Date: 06/05/2020 CLINICAL DATA:  Followup pleural effusion. EXAM: CHEST - 2 VIEW COMPARISON:  06/04/2020 and CT dated 06/02/2020. FINDINGS: Cardiac silhouette is normal in size. No mediastinal or hilar masses or evidence of adenopathy. Small bilateral pleural effusions. Atelectasis versus a small amount of fluid along the minor fissure. There is dependent opacity at the lung bases consistent with atelectasis. Lungs are hyperexpanded, but otherwise clear. No pneumothorax. Skeletal structures are demineralized. IMPRESSION: 1. Small bilateral pleural effusions with associated dependent atelectasis, without change from the previous day's chest radiograph and similar to the chest CT from 06/02/2020. 2. No convincing pneumonia or pulmonary edema.  No pneumothorax. 3. Hyperexpanded lungs consistent with COPD. Electronically Signed   By: Lajean Manes M.D.   On: 06/05/2020 10:30   DG Chest 2 View  Result Date: 06/02/2020 CLINICAL DATA:  Weakness, shortness of breath EXAM: CHEST - 2 VIEW COMPARISON:  05/26/2020 FINDINGS: Frontal and lateral views of the chest demonstrate a stable cardiac silhouette. Decreased left pleural effusion and left basilar consolidation. New right pleural effusion, obscuring the posterior costophrenic angle and seen along the major fissure. Right lower lobe consolidation is noted. No pneumothorax. No acute bony abnormalities. IMPRESSION: 1. Bibasilar  consolidation, favor atelectasis. 2. Bilateral pleural effusions. Electronically Signed   By: Randa Ngo M.D.   On: 06/02/2020 16:49   DG Chest 2 View  Result Date: 05/26/2020 CLINICAL DATA:  Shortness of breath. EXAM: CHEST - 2 VIEW COMPARISON:  CT chest/abdomen/pelvis 04/24/2020. Chest radiographs 04/09/2020. FINDINGS: Heart size at the upper limits of normal. Aortic atherosclerosis. New from the prior examination of 04/24/2020, there is a small to moderate left pleural effusion with associated left basilar atelectasis and/or consolidation. The right lung is clear. No evidence of pneumothorax. No acute bony abnormality identified. IMPRESSION: Small to moderate left pleural effusion with associated left basilar atelectasis and/or consolidation, new as compared to the examination of 04/24/2020. Aortic Atherosclerosis (ICD10-I70.0). Electronically Signed   By: Kellie Simmering DO   On: 05/26/2020 06:33   DG Pelvis 1-2 Views  Result Date: 06/02/2020 CLINICAL DATA:  [place], pain EXAM: PELVIS - 1-2 VIEW COMPARISON:  05/26/2020, 04/24/2020 FINDINGS: Single frontal view of the pelvis demonstrates partial visualization of a right hip arthroplasty. No acute displaced fractures. Stable sclerosis of the right side pubic symphysis. Stable spondylosis at the lumbosacral junction. Diffuse vascular calcifications unchanged. IMPRESSION: 1. Stable appearance of the pelvis.  No acute fractures. Electronically Signed   By: Randa Ngo M.D.   On: 06/02/2020 20:48   CT Head Wo Contrast  Result Date: 05/19/2020 CLINICAL DATA:  Fall trauma. Patient tripped in the parking lot. Hit head and has laceration above by bra. EXAM: CT HEAD WITHOUT CONTRAST CT CERVICAL SPINE WITHOUT CONTRAST TECHNIQUE: Multidetector CT imaging of the head and cervical spine was performed following the standard protocol without intravenous contrast. Multiplanar CT image reconstructions of the cervical spine were also generated. COMPARISON:  None.  FINDINGS: CT HEAD FINDINGS Brain: There is central and cortical atrophy. Periventricular white matter changes are consistent with small vessel disease. There is no intra or extra-axial fluid collection or mass lesion. The basilar cisterns and ventricles have a normal appearance. There is no CT evidence for acute infarction or hemorrhage. Vascular: . No hyperdense vessels. There is atherosclerotic calcification of the internal carotid arteries Skull: Normal. Negative for fracture or focal lesion. Sinuses/Orbits: No acute finding.  Other: There is LEFT frontal scalp edema in the supraorbital region. No associated underlying fracture. CT CERVICAL SPINE FINDINGS Alignment: Normal alignment. Skull base and vertebrae: No acute fracture. No primary bone lesion or focal pathologic process. Soft tissues and spinal canal: No prevertebral fluid or swelling. No visible canal hematoma. Disc levels: Moderate disc height loss notably at C3-4, C4-5, C5-6, and C6-7. Upper chest: Negative. Other: None IMPRESSION: 1. No evidence for acute intracranial abnormality. 2. Atrophy and small vessel disease. 3. LEFT frontal scalp edema in the supraorbital region. 4. No evidence for acute cervical spine abnormality. 5. Moderate mid cervical degenerative changes. Electronically Signed   By: Nolon Nations M.D.   On: 05/19/2020 18:53   CT ANGIO CHEST PE W OR WO CONTRAST  Result Date: 06/02/2020 CLINICAL DATA:  Hypoxia, dyspnea EXAM: CT ANGIOGRAPHY CHEST WITH CONTRAST TECHNIQUE: Multidetector CT imaging of the chest was performed using the standard protocol during bolus administration of intravenous contrast. Multiplanar CT image reconstructions and MIPs were obtained to evaluate the vascular anatomy. CONTRAST:  2mL OMNIPAQUE IOHEXOL 350 MG/ML SOLN COMPARISON:  04/24/2020 FINDINGS: Cardiovascular: There is excellent opacification of the pulmonary arterial tree. There is no intraluminal filling defect identified to suggest acute pulmonary  embolism. The central pulmonary arteries are of normal caliber. Moderate to severe multi-vessel coronary artery calcification. Global cardiac size is within normal limits. No pericardial effusion. There is extensive atherosclerotic calcification within the thoracic aorta. No aortic aneurysm. Mediastinum/Nodes: Thyroid unremarkable. No pathologic thoracic adenopathy. Esophagus unremarkable Lungs/Pleura: Mild centrilobular emphysema. Complete collapse of the right middle lobe has developed. A central obstructing lesion is not identified, however. There has developed moderate bilateral pleural effusions, left greater than right, with subtotal collapse of the lower lobes bilaterally, left greater than right. Superimposed focal consolidation is seen within the basilar lingula, likely infectious or inflammatory in the acute setting. No pneumothorax. Upper Abdomen: No acute abnormality. Musculoskeletal: There has developed extensive dependent subcutaneous body wall edema within the posterior thoracoabdominal wall, new since prior examination, nonspecific. This could reflect dependent edema in the setting of anasarca, or by a local inflammatory process such as cellulitis. No acute bone abnormality. Review of the MIP images confirms the above findings. IMPRESSION: No pulmonary embolism. Moderate to severe multi-vessel coronary artery calcification. Interval development of complete right middle lobe collapse and subtotal collapse of the lower lobes bilaterally, likely passive related to new moderate bilateral pleural effusions. Focal pulmonary infiltrate within the basilar lingula, likely infectious in the acute setting. Aspiration could appear similarly. Dependent subcutaneous body wall edema within the posterior thoracoabdominal wall, new since prior examination. Correlation with clinical examination would be helpful to exclude a local inflammatory process such as cellulitis. Alternatively, this could be seen in the setting  of anasarca and resultant dependent body wall edema. Aortic Atherosclerosis (ICD10-I70.0) and Emphysema (ICD10-J43.9). Electronically Signed   By: Fidela Salisbury MD   On: 06/02/2020 23:33   CT Cervical Spine Wo Contrast  Result Date: 05/19/2020 CLINICAL DATA:  Fall trauma. Patient tripped in the parking lot. Hit head and has laceration above by bra. EXAM: CT HEAD WITHOUT CONTRAST CT CERVICAL SPINE WITHOUT CONTRAST TECHNIQUE: Multidetector CT imaging of the head and cervical spine was performed following the standard protocol without intravenous contrast. Multiplanar CT image reconstructions of the cervical spine were also generated. COMPARISON:  None. FINDINGS: CT HEAD FINDINGS Brain: There is central and cortical atrophy. Periventricular white matter changes are consistent with small vessel disease. There is no intra or extra-axial fluid collection  or mass lesion. The basilar cisterns and ventricles have a normal appearance. There is no CT evidence for acute infarction or hemorrhage. Vascular: . No hyperdense vessels. There is atherosclerotic calcification of the internal carotid arteries Skull: Normal. Negative for fracture or focal lesion. Sinuses/Orbits: No acute finding. Other: There is LEFT frontal scalp edema in the supraorbital region. No associated underlying fracture. CT CERVICAL SPINE FINDINGS Alignment: Normal alignment. Skull base and vertebrae: No acute fracture. No primary bone lesion or focal pathologic process. Soft tissues and spinal canal: No prevertebral fluid or swelling. No visible canal hematoma. Disc levels: Moderate disc height loss notably at C3-4, C4-5, C5-6, and C6-7. Upper chest: Negative. Other: None IMPRESSION: 1. No evidence for acute intracranial abnormality. 2. Atrophy and small vessel disease. 3. LEFT frontal scalp edema in the supraorbital region. 4. No evidence for acute cervical spine abnormality. 5. Moderate mid cervical degenerative changes. Electronically Signed   By:  Nolon Nations M.D.   On: 05/19/2020 18:53   CT FEMUR RIGHT WO CONTRAST  Result Date: 05/26/2020 CLINICAL DATA:  Right thigh hematoma. EXAM: CT OF THE LOWER RIGHT EXTREMITY WITHOUT CONTRAST TECHNIQUE: Multidetector CT imaging of the right lower extremity was performed according to the standard protocol. COMPARISON:  CT chest, abdomen, and pelvis dated April 24, 2020. FINDINGS: Bones/Joint/Cartilage No acute fracture or dislocation. Subacute, healing fractures of right pubic bone and right inferior pubic ramus with associated sclerosis. Prior right total hip arthroplasty. No evidence of hardware failure or loosening. No joint effusion. Mild degenerative changes of the right knee. Osteopenia Ligaments Ligaments are suboptimally evaluated by CT. Muscles and Tendons Grossly intact. Soft tissue Somewhat ill-defined mixed density superficial fluid collection in the right lateral hip overlying the greater trochanter, measuring approximately 6.5 x 5.3 x 13.9 cm. Extensive circumferential soft tissue swelling of the right thigh. Partially visualized prominent soft tissue edema in the medial left thigh. No soft tissue mass. IMPRESSION: 1. Superficial 6.5 x 5.3 x 13.9 cm hematoma in the right lateral hip overlying the greater trochanter. 2. No acute osseous abnormality. Subacute, healing fractures of the right pubic bone and right inferior pubic ramus. 3. Prior right total hip arthroplasty without evidence of hardware complication. Electronically Signed   By: Titus Dubin M.D.   On: 05/26/2020 10:39   DG Chest Port 1 View  Result Date: 06/04/2020 CLINICAL DATA:  74 year old female with a history of thoracentesis EXAM: PORTABLE CHEST 1 VIEW COMPARISON:  CT 06/02/2020 FINDINGS: Cardiomediastinal silhouette within normal limits. No evidence of interlobular septal thickening. Coarsened interstitial markings throughout the lungs. Blunting of the bilateral costophrenic angles compatible with small bilateral pleural  effusions. No pneumothorax. No acute displaced fracture. IMPRESSION: No complicating features status post left thoracentesis. Electronically Signed   By: Corrie Mckusick D.O.   On: 06/04/2020 12:16   DG Hand Complete Right  Result Date: 05/24/2020 CLINICAL DATA:  Pain and bruising in the right hand involving the thumb, ring finger and little finger following a fall 4 days ago. EXAM: RIGHT HAND - COMPLETE 3+ VIEW COMPARISON:  None. FINDINGS: Nondisplaced fracture across the proximal aspect of the 1st distal phalanx, without intra-articular involvement, displacement or angulation. Mildly impacted fracture of the proximal aspect of the 5th distal phalanx with mild ventral displacement of the distal fragment. This has intra-articular extension. No additional fractures are seen and no dislocations are demonstrated. Moderate 1st IP joint degenerative changes. Minimal 1st MCP joint degenerative changes. IMPRESSION: Fractures of the 1st and 5th distal phalanges, as described above.  These results will be called to the ordering clinician or representative by the Radiologist Assistant, and communication documented in the PACS or [employer]. Electronically Signed   By: Claudie Revering M.D.   On: 05/24/2020 15:36   ECHOCARDIOGRAM COMPLETE  Result Date: 05/28/2020    ECHOCARDIOGRAM REPORT   Patient Name:   BARBARAJEAN KINZLER Date of Exam: 05/27/2020 Medical Rec #:  [id number]     Height:       61.0 in Accession #:    [id number]    Weight:       132.3 lb Date of Birth:  [birth year]      BSA:          1.584 m Patient Age:    1 years      BP:           136/60 mmHg Patient Gender: F             HR:           102 bpm. Exam Location:  [facility] Procedure: 2D Echo, Cardiac Doppler and Color Doppler Indications:     CHF-acute diastolic 619.50  History:         Patient has no prior history of Echocardiogram examinations.                  Stroke; Signs/Symptoms:Syncope.  Sonographer:     Sherrie Sport RDCS (AE) Referring Phys:  [id number] [facility]  AMIN Diagnosing Phys: Nelva Bush MD IMPRESSIONS  1. Left ventricular ejection fraction, by estimation, is 60 to 65%. The left ventricle has normal function. The left ventricle has no regional wall motion abnormalities. There is mild left ventricular hypertrophy. Left ventricular diastolic parameters are consistent with Grade I diastolic dysfunction (impaired relaxation).  2. Right ventricular systolic function is normal. The right ventricular size is normal. There is moderately elevated pulmonary artery systolic pressure.  3. Question left pleural effusion. Consider dedicated chest imaging for further evaluation.  4. The mitral valve is degenerative. Mild mitral valve regurgitation.  5. Tricuspid valve regurgitation is mild to moderate.  6. The aortic valve was not well visualized. Aortic valve regurgitation is mild. Mild to moderate aortic valve sclerosis/calcification is present, without any evidence of aortic stenosis.  7. The inferior vena cava is normal in size with greater than 50% respiratory variability, suggesting right atrial pressure of 3 mmHg. FINDINGS  Left Ventricle: Left ventricular ejection fraction, by estimation, is 60 to 65%. The left ventricle has normal function. The left ventricle has no regional wall motion abnormalities. The left ventricular internal cavity size was normal in size. There is  mild left ventricular hypertrophy. Left ventricular diastolic parameters are consistent with Grade I diastolic dysfunction (impaired relaxation). Right Ventricle: The right ventricular size is normal. No increase in right ventricular wall thickness. Right ventricular systolic function is normal. There is moderately elevated pulmonary artery systolic pressure. The tricuspid regurgitant velocity is 3.50 m/s, and with an assumed right atrial pressure of 3 mmHg, the estimated right ventricular systolic pressure is 45.8 mmHg. Left Atrium: Left atrial size was normal in size. Right Atrium: Right atrial  size was normal in size. Pericardium: Question left pleural effusion. Consider dedicated chest imaging for further evaluation. There is no evidence of pericardial effusion. Presence of pericardial fat pad. Mitral Valve: The mitral valve is degenerative in appearance. There is mild thickening of the mitral valve leaflet(s). There is mild calcification of the mitral valve leaflet(s). Mild mitral valve regurgitation. Tricuspid Valve: The tricuspid valve  is normal in structure. Tricuspid valve regurgitation is mild to moderate. Aortic Valve: The aortic valve was not well visualized. Aortic valve regurgitation is mild. Mild to moderate aortic valve sclerosis/calcification is present, without any evidence of aortic stenosis. Aortic valve mean gradient measures 7.5 mmHg. Aortic valve peak gradient measures 15.1 mmHg. Aortic valve area, by VTI measures 1.79 cm. Pulmonic Valve: The pulmonic valve was not well visualized. Pulmonic valve regurgitation is not visualized. No evidence of pulmonic stenosis. Aorta: The aortic root is normal in size and structure. Pulmonary Artery: The pulmonary artery is of normal size. Venous: The inferior vena cava is normal in size with greater than 50% respiratory variability, suggesting right atrial pressure of 3 mmHg. IAS/Shunts: No atrial level shunt detected by color flow Doppler.  LEFT VENTRICLE PLAX 2D LVIDd:         3.58 cm  Diastology LVIDs:         2.55 cm  LV e' medial:    6.74 cm/s LV PW:         1.27 cm  LV E/e' medial:  12.6 LV IVS:        1.21 cm  LV e' lateral:   6.42 cm/s LVOT diam:     2.00 cm  LV E/e' lateral: 13.2 LV SV:         74 LV SV Index:   46 LVOT Area:     3.14 cm  RIGHT VENTRICLE RV S prime:     11.00 cm/s TAPSE (M-mode): 3.3 cm LEFT ATRIUM             Index       RIGHT ATRIUM           Index LA diam:        3.50 cm 2.21 cm/m  RA Area:     14.30 cm LA Vol (A2C):   53.1 ml 33.51 ml/m RA Volume:   31.60 ml  19.94 ml/m LA Vol (A4C):   41.1 ml 25.94 ml/m LA  Biplane Vol: 47.8 ml 30.17 ml/m  AORTIC VALVE                    PULMONIC VALVE AV Area (Vmax):    1.36 cm     PV Vmax:        0.75 m/s AV Area (Vmean):   1.57 cm     PV Peak grad:   2.2 mmHg AV Area (VTI):     1.79 cm     RVOT Peak grad: 3 mmHg AV Vmax:           194.50 cm/s AV Vmean:          125.500 cm/s AV VTI:            0.410 m AV Peak Grad:      15.1 mmHg AV Mean Grad:      7.5 mmHg LVOT Vmax:         84.20 cm/s LVOT Vmean:        62.800 cm/s LVOT VTI:          0.234 m LVOT/AV VTI ratio: 0.57  AORTA Ao Root diam: 3.10 cm MITRAL VALVE               TRICUSPID VALVE MV Area (PHT): 5.13 cm    TR Peak grad:   49.0 mmHg MV Decel Time: 148 msec    TR Vmax:        350.00 cm/s MV E velocity: 84.80  cm/s MV A velocity: 97.70 cm/s  SHUNTS MV E/A ratio:  0.87        Systemic VTI:  0.23 m                            Systemic Diam: 2.00 cm Nelva Bush MD Electronically signed by Nelva Bush MD Signature Date/Time: 05/28/2020/8:51:55 AM    Final    US THORACENTESIS ASP PLEURAL SPACE W/IMG GUIDE  Result Date: 06/04/2020 INDICATION: Patient with history of COPD, acute on chronic hypoxic respiratory failure, new onset bilateral pleural effusions. Request to IR for diagnostic and therapeutic thoracentesis. EXAM: ULTRASOUND GUIDED LEFT THORACENTESIS MEDICATIONS: 10 mL 1% lidocaine COMPLICATIONS: None immediate. PROCEDURE: An ultrasound guided thoracentesis was thoroughly discussed with the patient and questions answered. The benefits, risks, alternatives and complications were also discussed. The patient understands and wishes to proceed with the procedure. Written consent was obtained. Ultrasound was performed to localize and mark an adequate pocket of fluid in the left chest. The area was then prepped and draped in the normal sterile fashion. 1% Lidocaine was used for local anesthesia. Under ultrasound guidance a 6 Fr Safe-T-Centesis catheter was introduced. Thoracentesis was performed. The catheter was removed  and a dressing applied. FINDINGS: A total of approximately 600 mL of clear yellow fluid was removed. Samples were sent to the laboratory as requested by the clinical team. IMPRESSION: Successful ultrasound guided left thoracentesis yielding 600 mL of pleural fluid. Read by Candiss Norse, PA-C Electronically Signed   By: Markus Daft M.D.   On: 06/04/2020 12:13    Microbiology: Recent Results (from the past 240 hour(s))  Respiratory Panel by RT PCR (Flu A&B, Covid) - Nasopharyngeal Swab     Status: None   Collection Time: 06/02/20 10:01 PM   Specimen: Nasopharyngeal Swab  Result Value Ref Range Status   SARS Coronavirus 2 by RT PCR NEGATIVE NEGATIVE Final    Comment: (NOTE) SARS-CoV-2 target nucleic acids are NOT DETECTED.  The SARS-CoV-2 RNA is generally detectable in upper respiratoy specimens during the acute phase of infection. The lowest concentration of SARS-CoV-2 viral copies this assay can detect is 131 copies/mL. A negative result does not preclude SARS-Cov-2 infection and should not be used as the sole basis for treatment or other patient management decisions. A negative result may occur with  improper specimen collection/handling, submission of specimen other than nasopharyngeal swab, presence of viral mutation(s) within the areas targeted by this assay, and inadequate number of viral copies (<131 copies/mL). A negative result must be combined with clinical observations, patient history, and epidemiological information. The expected result is Negative.  Fact Sheet for Patients:  PinkCheek.be  Fact Sheet for Healthcare Providers:  GravelBags.it  This test is no t yet approved or cleared by the [place] FDA and  has been authorized for detection and/or diagnosis of SARS-CoV-2 by FDA under an Emergency Use Authorization (EUA). This EUA will remain  in effect (meaning this test can be used) for the duration of  the COVID-19 declaration under Section 564(b)(1) of the Act, 21 U.S.C. section 360bbb-3(b)(1), unless the authorization is terminated or revoked sooner.     Influenza A by PCR NEGATIVE NEGATIVE Final   Influenza B by PCR NEGATIVE NEGATIVE Final    Comment: (NOTE) The Xpert Xpress SARS-CoV-2/FLU/RSV assay is intended as an aid in  the diagnosis of influenza from Nasopharyngeal swab specimens and  should not be used as a sole basis for treatment.  Nasal washings and  aspirates are unacceptable for Xpert Xpress SARS-CoV-2/FLU/RSV  testing.  Fact Sheet for Patients: PinkCheek.be  Fact Sheet for Healthcare Providers: GravelBags.it  This test is not yet approved or cleared by the [place] FDA and  has been authorized for detection and/or diagnosis of SARS-CoV-2 by  FDA under an Emergency Use Authorization (EUA). This EUA will remain  in effect (meaning this test can be used) for the duration of the  Covid-19 declaration under Section 564(b)(1) of the Act, 21  U.S.C. section 360bbb-3(b)(1), unless the authorization is  terminated or revoked. Performed at [facility], [address] [place], [place] [address]   Body fluid culture     Status: None   Collection Time: 06/04/20 11:28 AM   Specimen: PATH Cytology Pleural fluid  Result Value Ref Range Status   Specimen Description   Final    PLEURAL Performed at [facility], [address], [place], [place] [address]    Special Requests   Final    NONE Performed at [facility], [address] [place], [place] [address]    Gram Stain   Final    RARE WBC PRESENT, PREDOMINANTLY MONONUCLEAR NO ORGANISMS SEEN    Culture   Final    NO GROWTH Performed at [facility], [place] [address], [place], [place] [address]    Report Status 06/07/2020 FINAL  Final  SARS Coronavirus 2 by RT PCR (hospital order, performed in [facility] hospital lab)  Nasopharyngeal Nasopharyngeal Swab     Status: None   Collection Time: 06/05/20  6:36 PM   Specimen: Nasopharyngeal Swab  Result Value Ref Range Status   SARS Coronavirus 2 NEGATIVE NEGATIVE Final    Comment: (NOTE) SARS-CoV-2 target nucleic acids are NOT DETECTED.  The SARS-CoV-2 RNA is generally detectable in upper and lower respiratory specimens during the acute phase of infection. The lowest concentration of SARS-CoV-2 viral copies this assay can detect is 250 copies / mL. A negative result does not preclude SARS-CoV-2 infection and should not be used as the sole basis for treatment or other patient management decisions.  A negative result may occur with improper specimen collection / handling, submission of specimen other than nasopharyngeal swab, presence of viral mutation(s) within the areas targeted by this assay, and inadequate number of viral copies (<250 copies / mL). A negative result must be combined with clinical observations, patient history, and epidemiological information.  Fact Sheet for Patients:   StrictlyIdeas.no  Fact Sheet for Healthcare Providers: BankingDealers.co.za  This test is not yet approved or  cleared by the [place] FDA and has been authorized for detection and/or diagnosis of SARS-CoV-2 by FDA under an Emergency Use Authorization (EUA).  This EUA will remain in effect (meaning this test can be used) for the duration of the COVID-19 declaration under Section 564(b)(1) of the Act, 21 U.S.C. section 360bbb-3(b)(1), unless the authorization is terminated or revoked sooner.  Performed at [facility], [address] [place], [place] [address]      Labs: Basic Metabolic Panel: Recent Labs  Lab 06/02/20 1613 06/02/20 1747 06/03/20 1905 06/04/20 0412 06/05/20 0906 06/06/20 0705  NA 138  --  138 137 136 136  K 3.9  --  3.7 3.4* 3.3* 3.1*  CL 101  --  96* 97* 92* 94*  CO2 29  --   26 30 34* 30  GLUCOSE 83  --  151* 86 99 88  BUN 14  --  13 12 14 18   CREATININE 0.79  --  1.02* 1.02* 1.12* 1.15*  CALCIUM 8.0*  --  8.2* 7.7* 8.8* 8.5*  MG  --  1.8 1.9 1.8  --  2.0  PHOS  --  3.7 4.2  --   --   --    Liver Function Tests: Recent Labs  Lab 06/02/20 1747 06/03/20 1905 06/05/20 0906 06/06/20 0705  AST 24 25 24 23   ALT 20 22 20 18   ALKPHOS 129* 152* 126 114  BILITOT 0.9 1.1 0.7 0.6  PROT 4.5* 5.4* 5.1* 4.6*  ALBUMIN 2.2* 2.7* 2.6* 2.3*   No results for input(s): LIPASE, AMYLASE in the last 168 hours. No results for input(s): AMMONIA in the last 168 hours. CBC: Recent Labs  Lab 06/02/20 1747 06/02/20 1747 06/03/20 0501 06/03/20 1905 06/04/20 0412 06/05/20 0906 06/06/20 0705  WBC 4.8  --  4.6  --  3.3* 4.2 3.5*  NEUTROABS 3.7  --   --   --   --  2.9 2.2  HGB 6.9*   < > 6.3* 11.9* 10.2* 10.6* 10.3*  HCT 21.8*   < > 19.9* 35.9* 31.5* 32.8* 30.9*  MCV 93.6  --  94.3  --  91.8 93.2 92.0  PLT 154  --  156  --  153 184 185   < > = values in this interval not displayed.   Cardiac Enzymes: No results for input(s): CKTOTAL, CKMB, CKMBINDEX, TROPONINI in the last 168 hours. BNP: BNP (last 3 results) Recent Labs    03/20/20 1351 05/26/20 0518 06/02/20 1616  BNP 352.1* 554.7* 616.8*    ProBNP (last 3 results) No results for input(s): PROBNP in the last 8760 hours.  CBG: No results for input(s): GLUCAP in the last 168 hours.     Signed:  Nita Sells MD   [facility] 06/07/2020, 10:50 AM

## 2020-06-07 NOTE — TOC Transition Note (Addendum)
Transition of Care [facility]) - CM/SW Discharge Note   Patient Details  Name: Mariah [name] MRN: [id number] Date of Birth: 25-Apr-1946  [facility] CM/SW Contact:  Eileen Stanford, LCSW Phone Number: 06/07/2020, 9:55 AM   Clinical Narrative:   Clinical Social Worker facilitated patient discharge including contacting patient family and facility to confirm patient discharge plans.  Clinical information faxed to facility and family agreeable with plan.  Patient's niece will transport patient to [facility] (room 707) .  RN to call [phone] for report prior to discharge.    Final next level of care: [place] Barriers to Discharge: No Barriers Identified   Patient Goals and CMS Choice Patient states their goals for this hospitalization and ongoing recovery are:: to get patient stronger      Discharge Placement                Patient to be transferred to facility by: neice Name of family member notified: Janean - neice Patient and family notified of of transfer: 06/07/20  Discharge Plan and Services     Post Acute Care Choice: [place]                               Social Determinants of Health (SDOH) Interventions     Readmission Risk Interventions No flowsheet data found.

## 2020-06-07 NOTE — Care Management Important Message (Signed)
Important Message  Patient Details  Name: Mariah [name] MRN: [id number] Date of Birth: April 23, 1946   Medicare Important Message Given:  Yes     Dannette Barbara 06/07/2020, 12:25 PM

## 2020-06-07 NOTE — Plan of Care (Signed)

## 2020-06-10 DIAGNOSIS — N183 Chronic kidney disease, stage 3 unspecified: Secondary | ICD-10-CM | POA: Diagnosis not present

## 2020-06-10 DIAGNOSIS — S7011XA Contusion of right thigh, initial encounter: Secondary | ICD-10-CM | POA: Diagnosis not present

## 2020-06-10 DIAGNOSIS — I5032 Chronic diastolic (congestive) heart failure: Secondary | ICD-10-CM | POA: Diagnosis not present

## 2020-06-10 DIAGNOSIS — I129 Hypertensive chronic kidney disease with stage 1 through stage 4 chronic kidney disease, or unspecified chronic kidney disease: Secondary | ICD-10-CM | POA: Diagnosis not present

## 2020-06-11 ENCOUNTER — Other Ambulatory Visit: Payer: Self-pay | Admitting: Nephrology

## 2020-06-11 DIAGNOSIS — R52 Pain, unspecified: Secondary | ICD-10-CM

## 2020-06-12 DIAGNOSIS — I5032 Chronic diastolic (congestive) heart failure: Secondary | ICD-10-CM | POA: Diagnosis not present

## 2020-06-12 DIAGNOSIS — I129 Hypertensive chronic kidney disease with stage 1 through stage 4 chronic kidney disease, or unspecified chronic kidney disease: Secondary | ICD-10-CM | POA: Diagnosis not present

## 2020-06-12 DIAGNOSIS — I10 Essential (primary) hypertension: Secondary | ICD-10-CM | POA: Diagnosis not present

## 2020-06-12 DIAGNOSIS — S7011XA Contusion of right thigh, initial encounter: Secondary | ICD-10-CM | POA: Diagnosis not present

## 2020-06-14 ENCOUNTER — Ambulatory Visit
Admission: RE | Admit: 2020-06-14 | Discharge: 2020-06-14 | Disposition: A | Discharge status: Home / Self Care | Payer: PPO | Source: Ambulatory Visit | Attending: Nephrology | Admitting: Nephrology

## 2020-06-14 ENCOUNTER — Other Ambulatory Visit: Payer: Self-pay

## 2020-06-14 DIAGNOSIS — Z96641 Presence of right artificial hip joint: Secondary | ICD-10-CM | POA: Diagnosis not present

## 2020-06-14 DIAGNOSIS — I5032 Chronic diastolic (congestive) heart failure: Secondary | ICD-10-CM | POA: Diagnosis not present

## 2020-06-14 DIAGNOSIS — M79651 Pain in right thigh: Secondary | ICD-10-CM | POA: Diagnosis not present

## 2020-06-14 DIAGNOSIS — R600 Localized edema: Secondary | ICD-10-CM | POA: Diagnosis not present

## 2020-06-14 DIAGNOSIS — R52 Pain, unspecified: Secondary | ICD-10-CM | POA: Insufficient documentation

## 2020-06-14 DIAGNOSIS — S7011XA Contusion of right thigh, initial encounter: Secondary | ICD-10-CM | POA: Diagnosis not present

## 2020-06-14 DIAGNOSIS — S32591A Other specified fracture of right pubis, initial encounter for closed fracture: Secondary | ICD-10-CM | POA: Diagnosis not present

## 2020-06-14 DIAGNOSIS — I129 Hypertensive chronic kidney disease with stage 1 through stage 4 chronic kidney disease, or unspecified chronic kidney disease: Secondary | ICD-10-CM | POA: Diagnosis not present

## 2020-06-17 DIAGNOSIS — S7011XA Contusion of right thigh, initial encounter: Secondary | ICD-10-CM | POA: Diagnosis not present

## 2020-06-17 DIAGNOSIS — I129 Hypertensive chronic kidney disease with stage 1 through stage 4 chronic kidney disease, or unspecified chronic kidney disease: Secondary | ICD-10-CM | POA: Diagnosis not present

## 2020-06-17 DIAGNOSIS — I5032 Chronic diastolic (congestive) heart failure: Secondary | ICD-10-CM | POA: Diagnosis not present

## 2020-06-18 ENCOUNTER — Other Ambulatory Visit: Payer: Self-pay

## 2020-06-18 ENCOUNTER — Ambulatory Visit (INDEPENDENT_AMBULATORY_CARE_PROVIDER_SITE_OTHER): Payer: PPO | Admitting: Adult Health

## 2020-06-18 ENCOUNTER — Encounter: Payer: Self-pay | Admitting: Adult Health

## 2020-06-18 VITALS — BP 117/58 | HR 81 | Resp 14 | Wt 132.0 lb

## 2020-06-18 DIAGNOSIS — S6291XS Unspecified fracture of right wrist and hand, sequela: Secondary | ICD-10-CM | POA: Diagnosis not present

## 2020-06-18 DIAGNOSIS — I82442 Acute embolism and thrombosis of left tibial vein: Secondary | ICD-10-CM

## 2020-06-18 DIAGNOSIS — J90 Pleural effusion, not elsewhere classified: Secondary | ICD-10-CM | POA: Diagnosis not present

## 2020-06-18 DIAGNOSIS — I509 Heart failure, unspecified: Secondary | ICD-10-CM | POA: Insufficient documentation

## 2020-06-18 DIAGNOSIS — J449 Chronic obstructive pulmonary disease, unspecified: Secondary | ICD-10-CM

## 2020-06-18 DIAGNOSIS — S7011XD Contusion of right thigh, subsequent encounter: Secondary | ICD-10-CM

## 2020-06-18 DIAGNOSIS — M25551 Pain in right hip: Secondary | ICD-10-CM

## 2020-06-18 DIAGNOSIS — R0600 Dyspnea, unspecified: Secondary | ICD-10-CM

## 2020-06-18 DIAGNOSIS — S7001XS Contusion of right hip, sequela: Secondary | ICD-10-CM | POA: Diagnosis not present

## 2020-06-18 DIAGNOSIS — N1832 Chronic kidney disease, stage 3b: Secondary | ICD-10-CM | POA: Diagnosis not present

## 2020-06-18 DIAGNOSIS — I071 Rheumatic tricuspid insufficiency: Secondary | ICD-10-CM | POA: Diagnosis not present

## 2020-06-18 DIAGNOSIS — R600 Localized edema: Secondary | ICD-10-CM

## 2020-06-18 DIAGNOSIS — S7001XA Contusion of right hip, initial encounter: Secondary | ICD-10-CM | POA: Insufficient documentation

## 2020-06-18 NOTE — Patient Instructions (Signed)
Pleural Effusion Pleural effusion is an abnormal buildup of fluid in the layers of tissue between the lungs and the inside of the chest (pleural space) The two layers of tissue that line the lungs and the inside of the chest are called pleura. Usually, there is no air in the space between the pleura, only a thin layer of fluid. Some conditions can cause a large amount of fluid to build up, which can cause the lung to collapse if untreated. A pleural effusion is usually caused by another disease that requires treatment. What are the causes? Pleural effusion can be caused by:  Heart failure.  Certain infections, such as pneumonia or tuberculosis.  Cancer.  A blood clot in the lung (pulmonary embolism).  Complications from surgery, such as from open heart surgery.  Liver disease (cirrhosis).  Kidney disease. What are the signs or symptoms? In some cases, pleural effusion may cause no symptoms. If symptoms are present, they may include:  Shortness of breath, especially when lying down.  Chest pain. This may get worse when taking a deep breath.  Fever.  Dry, long-lasting (chronic) cough.  Hiccups.  Rapid breathing. An underlying condition that is causing the pleural effusion (such as heart failure, pneumonia, blood clots, tuberculosis, or cancer) may also cause other symptoms. How is this diagnosed? This condition may be diagnosed based on:  Your symptoms and medical history.  A physical exam.  A chest X-ray.  A procedure to use a needle to remove fluid from the pleural space (thoracentesis). This fluid is tested.  Other imaging studies of the chest, such as ultrasound or CT scan. How is this treated? Depending on the cause of your condition, treatment may include:  Treating the underlying condition that is causing the effusion. When that condition improves, the effusion will also improve. Examples of treatment for underlying conditions include: ? Antibiotic medicines to  treat an infection. ? Diuretics or other heart medicines to treat heart failure.  Thoracentesis.  Placing a thin flexible tube under your skin and into your chest to continuously drain the effusion (indwelling pleural catheter).  Surgery to remove the outer layer of tissue from the pleural space (decortication).  A procedure to put medicine into the chest cavity to seal the pleural space and prevent fluid buildup (pleurodesis).  Chemotherapy and radiation therapy, if you have cancerous (malignant) pleural effusion. These treatments are typically used to treat cancer. They kill certain cells in the body. Follow these instructions at home:  Take over-the-counter and prescription medicines only as told by your health care provider.  Ask your health care provider what activities are safe for you.  Keep track of how long you are able to do mild exercise (such as walking) before you get short of breath. Write down this information to share with your health care provider. Your ability to exercise should improve over time.  Do not use any products that contain nicotine or tobacco, such as cigarettes and e-cigarettes. If you need help quitting, ask your health care provider.  Keep all follow-up visits as told by your health care provider. This is important. Contact a health care provider if:  The amount of time that you are able to do mild exercise: ? Decreases. ? Does not improve with time.  You have a fever. Get help right away if:  You are short of breath.  You develop chest pain.  You develop a new cough. Summary  Pleural effusion is an abnormal buildup of fluid in the layers   of tissue between the lungs and the inside of the chest.  Pleural effusion can have many causes, including heart failure, pulmonary embolism, infections, or cancer.  Symptoms of pleural effusion can include shortness of breath, chest pain, fever, long-lasting (chronic) cough, hiccups, or rapid  breathing.  Diagnosis often involves making images of the chest (such as with ultrasound or X-ray) and removing fluid (thoracentesis) to send for testing.  Treatment for pleural effusion depends on what underlying condition is causing it. This information is not intended to replace advice given to you by your health care provider. Make sure you discuss any questions you have with your health care provider. Document Revised: 06/18/2017 Document Reviewed: 03/11/2017 Elsevier Patient Education  2020 [employer]. Heart Failure, Diagnosis  Heart failure is a condition in which the heart has trouble pumping blood because it has become weak or stiff. This means that the heart does not pump blood well enough for the body to stay healthy. For some people with heart failure, fluid may back up into the lungs. There may also be swelling (edema) in the lower legs. Heart failure is usually a long-term (chronic) condition. It is important for you to take good care of yourself and follow the treatment plan from your health care provider. What are the causes? This condition may be caused by:  High blood pressure (hypertension). Hypertension causes the heart muscle to work harder than normal. This makes the heart stiff or weak.  Coronary artery disease, or CAD. CAD is the buildup of cholesterol and fat (plaque) in the arteries of the heart.  Heart attack, also called myocardial infarction. This injures the heart muscle, making it hard for the heart to pump blood.  Abnormal heart valves. The valves do not open and close properly, forcing the heart to pump harder to keep the blood flowing.  Heart muscle disease (cardiomyopathy or myocarditis). This is damage to the heart muscle. It can increase the risk of heart failure.  Lung disease. The heart works harder when the lungs are not healthy.  Abnormal heart rhythms. These can lead to heart failure. What increases the risk? The risk of heart failure increases  as a person ages. This condition is also more likely to develop in people who:  Are overweight.  Are female.  Smoke or chew tobacco.  Abuse alcohol or illegal drugs.  Have taken medicines that can damage the heart, such as chemotherapy drugs.  Have diabetes.  Have abnormal heart rhythms.  Have thyroid problems.  Have low blood counts (anemia). What are the signs or symptoms? Symptoms of this condition include:  Shortness of breath with activity, such as when climbing stairs.  A cough that does not go away.  Swelling of the feet, ankles, legs, or abdomen.  Losing weight for no reason.  Trouble breathing when lying flat (orthopnea).  Waking from sleep because of the need to sit up and get more air.  Rapid heartbeat.  Tiredness (fatigue) and loss of energy.  Feeling light-headed, dizzy, or close to fainting.  Loss of appetite.  Nausea.  Waking up more often during the night to urinate (nocturia).  Confusion. How is this diagnosed? This condition is diagnosed based on:  Your medical history, symptoms, and a physical exam.  Diagnostic tests, which may include: ? Echocardiogram. ? Electrocardiogram (ECG). ? Chest X-ray. ? Blood tests. ? Exercise stress test. ? Radionuclide scans. ? Cardiac catheterization and angiogram. How is this treated? Treatment for this condition is aimed at managing the symptoms  of heart failure. Medicines Treatment may include medicines that:  Help lower blood pressure by relaxing (dilating) the blood vessels. These medicines are called ACE inhibitors (angiotensin-converting enzyme) and ARBs (angiotensin receptor blockers).  Cause the kidneys to remove salt and water from the blood through urination (diuretics).  Improve heart muscle strength and prevent the heart from beating too fast (beta blockers).  Increase the force of the heartbeat (digoxin). Healthy behavior changes     Treatment may also include making healthy  lifestyle changes, such as:  Reaching and staying at a healthy weight.  Quitting smoking or chewing tobacco.  Eating heart-healthy foods.  Limiting or avoiding alcohol.  Stopping the use of illegal drugs.  Being physically active.  Other treatments Other treatments may include:  Procedures to open blocked arteries or repair damaged valves.  Placing a pacemaker to improve heart function (cardiac resynchronization therapy).  Placing a device to treat serious abnormal heart rhythms (implantable cardioverter defibrillator, or ICD).  Placing a device to improve the pumping ability of the heart (left ventricular assist device, or LVAD).  Receiving a healthy heart from a donor (heart transplant). This is done when other treatments have not helped. Follow these instructions at home:  Manage other health conditions as told by your health care provider. These may include hypertension, diabetes, thyroid disease, or abnormal heart rhythms.  Get ongoing education and support as needed. Learn as much as you can about heart failure.  Keep all follow-up visits as told by your health care provider. This is important. Summary  Heart failure is a condition in which the heart has trouble pumping blood because it has become weak or stiff.  This condition is caused by high blood pressure and other diseases of the heart and lungs.  Symptoms of this condition include shortness of breath, tiredness (fatigue), nausea, and swelling of the feet, ankles, legs, or abdomen.  Treatments for this condition may include medicines, lifestyle changes, and surgery.  Manage other health conditions as told by your health care provider. This information is not intended to replace advice given to you by your health care provider. Make sure you discuss any questions you have with your health care provider. Document Revised: 09/23/2018 Document Reviewed: 09/23/2018 Elsevier Patient Education  [place].

## 2020-06-18 NOTE — Progress Notes (Signed)
Established patient visit   Patient: Mariah [name]   DOB: Mar 24, 1946   74 y.o. Female  MRN: [id number] Visit Date: 06/18/2020  Today's healthcare provider: Marcille Buffy, FNP   Chief Complaint  Patient presents with   Hospitalization Follow-up   Subjective    HPI  Follow up Hospitalization  Patient was admitted to [facility] on 06/02/20 and discharged on 06/06/20 and transitioned into [facility] 06/07/20 and discharged from [facility] 06/18/20 this morning.  She was treated for fall, right side hematoma, persisted weakness and shortness of breath. Treatment for this included d/c Xarelto and starting patient on Eliquis 5mg   On 11/21. Telephone follow up was done on N/A She prior to this had a transfusion at [facility] on 05/30/20 and at that time hemoglobin was 8.7. Chest x ray showed small left pleural effusion with left basilar opacity and right trace pleural effusion.  Chest x ray on this admission on 06/02/2020 was for community acquired pneumonia and acute respiratiory failure. Negative Covid test. Not covid vaccinated.  She reports this condition is stayed the same.  She has completed Levaquin, is still on Lasix 40 mg qd.  BNP was 616.8  She had a DVT in left lower extremity 1 month ago and was on Eliquis this was discontinued by the hospital at [facility] due to fall and hematomas with major bruising.   Eliquis was restarted 06/09/2020.  Patient  denies any fever, body aches,chills, rash, chest pain, nausea, vomiting, or diarrhea.  Denies dizziness, lightheadedness, pre syncopal or syncopal episodes.    Recommendations for Outpatient Follow-up:  1. Will need Chem-12 CBC in about 1 week 2. Needs chest x-ray in about 1 week and follow-up with Dr. Milas Hock of pulmonology to determine etiology effusions 3. Recommend recheck of potassium in the outpatient setting and replacement 4. Limited prescription of tramadol given on discharge 5. Follow-up in the outpatient setting  with orthopedics Dr.Poggi to be arranged by skilled facility 6. New medications Lasix and Levaquin given this admission    CT reviewed.  FINDINGS:  Bones/Joint/Cartilage   Right hip prosthesis. Stable speckled mixed density in the lower  sacrum and right pubic bone with evidence of prior right inferior  pubic ramus and pubic bone fractures. Sclerosis in the femur along  the distal margin of the stem component of the prosthesis.   Patellofemoral chondral thinning and spurring. No new fracture is  identified.   Ligaments   Suboptimally assessed by CT.   Muscles and Tendons   Unremarkable   Soft tissues   Mixed density collection in the subcutaneous tissues lateral to the  greater trochanter, measuring 7.5 by 8.1 by 12.3 cm (volume = 390  cm^3), formerly 13.9 by 6.5 by 5.3 cm (volume = 250 cm^3). This  could be from hematoma, postoperative fluid collection, or Morel  Lavallee lesion.   Iliac atherosclerosis. Low-grade subcutaneous edema in the thigh  region especially laterally.   IMPRESSION:  1. Mixed density collection in the subcutaneous tissues lateral to  the greater trochanter, measuring 390 cubic cm in volume, formerly  250 cubic cm. This could be from hematoma, postoperative fluid  collection, or Morel Lavallee lesion. No internal gas density  currently to favor abscess.  2. Stable speckled mixed density in the lower sacrum and right pubic  bone presumably from healing fractures of the sacrum, right pubic  bone, and right inferior pubic ramus, as reported on 04/24/2020.  3. Patellofemoral chondral thinning and spurring.  4. Low-grade subcutaneous  edema in the thigh region especially  laterally.  5. Iliac atherosclerosis.    Electronically Signed    By: Van Clines M.D.    On: 06/15/2020 09:59    ECHO reviewed.  Procedure: 2D Echo, Cardiac Doppler and Color Doppler   Indications:     CHF-acute diastolic 170.01     History:         Patient has no  prior history of Echocardiogram  examinations.                   Stroke; Signs/Symptoms:Syncope.     Sonographer:     Sherrie Sport RDCS (AE)  Referring Phys:  [id number] [facility] AMIN  Diagnosing Phys: Nelva Bush MD   IMPRESSIONS     1. Left ventricular ejection fraction, by estimation, is 60 to 65%. The  left ventricle has normal function. The left ventricle has no regional  wall motion abnormalities. There is mild left ventricular hypertrophy.  Left ventricular diastolic parameters  are consistent with Grade I diastolic dysfunction (impaired relaxation).   2. Right ventricular systolic function is normal. The right ventricular  size is normal. There is moderately elevated pulmonary artery systolic  pressure.   3. Question left pleural effusion. Consider dedicated chest imaging for  further evaluation.   4. The mitral valve is degenerative. Mild mitral valve regurgitation.   5. Tricuspid valve regurgitation is mild to moderate.   6. The aortic valve was not well visualized. Aortic valve regurgitation  is mild. Mild to moderate aortic valve sclerosis/calcification is present,  without any evidence of aortic stenosis.   7. The inferior vena cava is normal in size with greater than 50%  respiratory variability, suggesting right atrial pressure of 3 mmHg.   FINDINGS   Left Ventricle: Left ventricular ejection fraction, by estimation, is 60  to 65%. The left ventricle has normal function. The left ventricle has no  regional wall motion abnormalities. The left ventricular internal cavity  size was normal in size. There is   mild left ventricular hypertrophy. Left ventricular diastolic parameters  are consistent with Grade I diastolic dysfunction (impaired relaxation).   Right Ventricle: The right ventricular size is normal. No increase in  right ventricular wall thickness. Right ventricular systolic function is  normal. There is moderately elevated pulmonary artery systolic pressure.   The tricuspid regurgitant velocity is  3.50 m/s, and with an assumed right atrial pressure of 3 mmHg, the  estimated right ventricular systolic pressure is 75.9 mmHg.   Left Atrium: Left atrial size was normal in size.   Right Atrium: Right atrial size was normal in size.   Pericardium: Question left pleural effusion. Consider dedicated chest  imaging for further evaluation. There is no evidence of pericardial  effusion. Presence of pericardial fat pad.   Mitral Valve: The mitral valve is degenerative in appearance. There is  mild thickening of the mitral valve leaflet(s). There is mild  calcification of the mitral valve leaflet(s). Mild mitral valve  regurgitation.   Tricuspid Valve: The tricuspid valve is normal in structure. Tricuspid  valve regurgitation is mild to moderate.   Aortic Valve: The aortic valve was not well visualized. Aortic valve  regurgitation is mild. Mild to moderate aortic valve  sclerosis/calcification is present, without any evidence of aortic  stenosis. Aortic valve mean gradient measures 7.5 mmHg. Aortic  valve peak gradient measures 15.1 mmHg. Aortic valve area, by VTI measures  1.79 cm.   Pulmonic Valve: The pulmonic valve was not well  visualized. Pulmonic valve  regurgitation is not visualized. No evidence of pulmonic stenosis.   Aorta: The aortic root is normal in size and structure.   Pulmonary Artery: The pulmonary artery is of normal size.   Venous: The inferior vena cava is normal in size with greater than 50%  respiratory variability, suggesting right atrial pressure of 3 mmHg.   IAS/Shunts: No atrial level shunt detected by color flow Doppler.      LEFT VENTRICLE  PLAX 2D  LVIDd:         3.58 cm  Diastology  LVIDs:         2.55 cm  LV e' medial:    6.74 cm/s  LV PW:         1.27 cm  LV E/e' medial:  12.6  LV IVS:        1.21 cm  LV e' lateral:   6.42 cm/s  LVOT diam:     2.00 cm  LV E/e' lateral: 13.2  LV SV:         74  LV SV  Index:   46  LVOT Area:     3.14 cm      RIGHT VENTRICLE  RV S prime:     11.00 cm/s  TAPSE (M-mode): 3.3 cm   LEFT ATRIUM             Index       RIGHT ATRIUM           Index  LA diam:        3.50 cm 2.21 cm/m  RA Area:     14.30 cm  LA Vol (A2C):   53.1 ml 33.51 ml/m RA Volume:   31.60 ml  19.94 ml/m  LA Vol (A4C):   41.1 ml 25.94 ml/m  LA Biplane Vol: 47.8 ml 30.17 ml/m   AORTIC VALVE                    PULMONIC VALVE  AV Area (Vmax):    1.36 cm     PV Vmax:        0.75 m/s  AV Area (Vmean):   1.57 cm     PV Peak grad:   2.2 mmHg  AV Area (VTI):     1.79 cm     RVOT Peak grad: 3 mmHg  AV Vmax:           194.50 cm/s  AV Vmean:          125.500 cm/s  AV VTI:            0.410 m  AV Peak Grad:      15.1 mmHg  AV Mean Grad:      7.5 mmHg  LVOT Vmax:         84.20 cm/s  LVOT Vmean:        62.800 cm/s  LVOT VTI:          0.234 m  LVOT/AV VTI ratio: 0.57     AORTA  Ao Root diam: 3.10 cm   MITRAL VALVE               TRICUSPID VALVE  MV Area (PHT): 5.13 cm    TR Peak grad:   49.0 mmHg  MV Decel Time: 148 msec    TR Vmax:        350.00 cm/s  MV E velocity: 84.80 cm/s  MV A velocity: 97.70 cm/s  SHUNTS  MV E/A  ratio:  0.87        Systemic VTI:  0.23 m                             Systemic Diam: 2.00 cm   Nelva Bush MD  Electronically signed by Nelva Bush MD  Signature Date/Time: 05/28/2020/8:51:55 AM         Final    Imaging Info  ECHOCARDIOGRAM COMPLETE (Order #[id number]) on 05/27/20  Study History  ECHOCARDIOGRAM COMPLETE (Order #[id number]) on 05/27/20  [employer] Images   Show images for ECHOCARDIOGRAM COMPLETE Images on [employer]   Show images for Raenette, Sakata "AnnaRose" Performing Technologist/Nurse  Performing Technologist/Nurse: Gabriel Cirri  Reason for Exam Priority: Anticipated Discharge Comments: Suspecting CHF  Patient Data  Height 61 in    BP 130/82 mmHg         ----------------------------------------------------------------------------------------- -     Medications: Outpatient Medications Prior to Visit  Medication Sig   acetaminophen (TYLENOL) 325 MG tablet Take 2 tablets (650 mg total) by mouth every 6 (six) hours as needed for mild pain (or Fever >/= 101).   albuterol (VENTOLIN HFA) 108 (90 Base) MCG/ACT inhaler INHALE 1-2 PUFFS INTO THE LUNGS EVERY 6 HOURS AS NEEDED FOR WHEEZING OR SHORTNESS OF BREATH.   apixaban (ELIQUIS) 5 MG TABS tablet Take 1 tablet (5 mg total) by mouth 2 (two) times daily.   ascorbic acid (VITAMIN C) 250 MG tablet Take 1 tablet (250 mg total) by mouth 2 (two) times daily.   aspirin EC 81 MG tablet Take 81 mg by mouth daily. Swallow whole.   betamethasone dipropionate (DIPROLENE) 0.05 % ointment Apply topically daily.   calcipotriene (DOVONOX) 0.005 % cream APPLY TO SKIN ONCE A DAY APPLY DAILY ON HANDS, FEET AND WRIST X 2 WEEKS, THEN USE 5 DAYS A WEEK   Cholecalciferol (VITAMIN D3) 2000 units TABS Take 2,000 Units by mouth daily.   clobetasol (TEMOVATE) 0.05 % external solution APPLY TO AFFECTED AREA TWICE A DAY   COSENTYX SENSOREADY, 300 MG, 150 MG/ML SOAJ Inject 2 Syringes into the skin every 28 (twenty-eight) days.   donepezil (ARICEPT) 5 MG tablet Take 5 mg by mouth at bedtime.   ferrous [id number] C-folic acid (TRINSICON / FOLTRIN) capsule Take 1 capsule by mouth 2 (two) times daily after a meal.   ferrous sulfate 325 (65 FE) MG tablet Take 325 mg by mouth 2 (two) times daily.   fluticasone furoate-vilanterol (BREO ELLIPTA) 100-25 MCG/INH AEPB Inhale 1 puff into the lungs daily.   furosemide (LASIX) 40 MG tablet Take 1 tablet (40 mg total) by mouth daily.   gabapentin (NEURONTIN) 300 MG capsule TAKE 1 CAPSULE BY MOUTH TWICE A DAY (Patient taking differently: Take 300 mg by mouth 2 (two) times daily. TAKE 1 CAPSULE BY MOUTH TWICE A DAY)   guaiFENesin (MUCINEX) 600 MG 12 hr tablet Take 1  tablet (600 mg total) by mouth 2 (two) times daily.   levocetirizine (XYZAL) 5 MG tablet TAKE 1 TABLET BY MOUTH EVERY DAY IN THE EVENING (Patient taking differently: Take 5 mg by mouth every evening. )   methotrexate (RHEUMATREX) 2.5 MG tablet Take 15 mg by mouth once a week.    metoprolol succinate (TOPROL-XL) 50 MG 24 hr tablet TAKE 1 TABLET (50 MG TOTAL) BY MOUTH DAILY. TAKE WITH OR IMMEDIATELY FOLLOWING A MEAL.   nicotine (NICODERM CQ - DOSED IN MG/24 HR) 7 mg/24hr patch Place 1  patch (7 mg total) onto the skin daily.   pantoprazole (PROTONIX) 20 MG tablet TAKE 1 TABLET (20 MG TOTAL) BY MOUTH 2 (TWO) TIMES DAILY BEFORE A MEAL.   potassium chloride SA (KLOR-CON) 20 MEQ tablet Take 2 tablets (40 mEq total) by mouth daily.   predniSONE (DELTASONE) 20 MG tablet Take 1 tablet (20 mg total) by mouth daily before breakfast.   rOPINIRole (REQUIP) 1 MG tablet TAKE 1 TABLET (1 MG TOTAL) BY MOUTH AT BEDTIME.   simvastatin (ZOCOR) 20 MG tablet TAKE 1 TABLET BY MOUTH EVERY DAY (Patient taking differently: Take 20 mg by mouth daily at 6 PM. )   traMADol (ULTRAM) 50 MG tablet TAKE 1 TABLET BY MOUTH EVERY 6 HOURS AS NEEDED FOR MODERATE OR SEVERE PAIN   No facility-administered medications prior to visit.    Review of Systems  Constitutional: Positive for fatigue. Negative for activity change, appetite change, chills, diaphoresis, fever and unexpected weight change.  HENT: Negative.   Respiratory: Positive for cough and shortness of breath. Negative for apnea, choking, chest tightness, wheezing and stridor.   Cardiovascular: Positive for leg swelling. Negative for chest pain and palpitations.  Gastrointestinal: Negative.   Genitourinary: Negative.   Musculoskeletal: Positive for arthralgias and myalgias.  Skin: Positive for color change (bruising from previoius fall ) and wound.  Neurological: Negative.   Psychiatric/Behavioral: Negative.     Last CBC Lab Results  Component Value Date    WBC 3.5 (L) 06/06/2020   HGB 10.3 (L) 06/06/2020   HCT 30.9 (L) 06/06/2020   MCV 92.0 06/06/2020   MCH 30.7 06/06/2020   RDW 15.9 (H) 06/06/2020   PLT 185 95/28/4132   Last metabolic panel Lab Results  Component Value Date   GLUCOSE 88 06/06/2020   NA 136 06/06/2020   K 3.1 (L) 06/06/2020   CL 94 (L) 06/06/2020   CO2 30 06/06/2020   BUN 18 06/06/2020   CREATININE 1.15 (H) 06/06/2020   GFRNONAA 50 (L) 06/06/2020   GFRAA 45 (L) 04/12/2020   CALCIUM 8.5 (L) 06/06/2020   PHOS 4.2 06/03/2020   PROT 4.6 (L) 06/06/2020   ALBUMIN 2.3 (L) 06/06/2020   LABGLOB 2.0 03/20/2020   AGRATIO 1.5 03/20/2020   BILITOT 0.6 06/06/2020   ALKPHOS 114 06/06/2020   AST 23 06/06/2020   ALT 18 06/06/2020   ANIONGAP 12 06/06/2020   Last lipids Lab Results  Component Value Date   CHOL 163 08/08/2019   HDL 53 08/08/2019   LDLCALC 85 08/08/2019   TRIG 146 08/08/2019   CHOLHDL 3.1 08/08/2019   Last hemoglobin A1c Lab Results  Component Value Date   HGBA1C 5.5 08/08/2019   Last thyroid functions Lab Results  Component Value Date   TSH 3.167 06/03/2020   Last vitamin D No results found for: 25OHVITD2, 25OHVITD3, VD25OH Last vitamin B12 and Folate Lab Results  Component Value Date   VITAMINB12 186 06/03/2020   FOLATE 5.8 (L) 06/03/2020      Objective    BP (!) 117/58    Pulse 81    Resp 14    Wt 132 lb (59.9 kg)    SpO2 93%    BMI 24.94 kg/m  BP Readings from Last 3 Encounters:  06/18/20 (!) 117/58  06/07/20 120/71  05/28/20 104/64   Wt Readings from Last 3 Encounters:  06/18/20 132 lb (59.9 kg)  06/07/20 123 lb 6.4 oz (56 kg)  05/28/20 128 lb 12 oz (58.4 kg)    Weight increased, she  reports edema has improved, no worsening shortness of breath. She reports she ate everything they gave her at [facility] and it is having a great appetite.   Physical Exam Vitals reviewed.  Constitutional:      General: She is not in acute distress.    Appearance: Normal appearance. She  is ill-appearing (chronic ). She is not toxic-appearing or diaphoretic.  HENT:     Head: Atraumatic.     Right Ear: Tympanic membrane, ear canal and external ear normal. There is no impacted cerumen.     Left Ear: Tympanic membrane, ear canal and external ear normal. There is no impacted cerumen.     Nose: Nose normal.     Mouth/Throat:     Mouth: Mucous membranes are moist.     Pharynx: No oropharyngeal exudate or posterior oropharyngeal erythema.  Eyes:     General: No scleral icterus.       Right eye: No discharge.        Left eye: No discharge.     Conjunctiva/sclera: Conjunctivae normal.  Cardiovascular:     Rate and Rhythm: Normal rate and regular rhythm.     Pulses: Normal pulses.     Heart sounds: Normal heart sounds. No murmur heard.  No friction rub. No gallop.   Pulmonary:     Effort: Pulmonary effort is normal.     Breath sounds: Normal breath sounds. No wheezing or rhonchi.  Abdominal:     General: There is no distension.     Palpations: Abdomen is soft.     Tenderness: There is no abdominal tenderness.  Musculoskeletal:        General: Tenderness present.     Cervical back: Normal range of motion and neck supple. No rigidity or tenderness.     Right lower leg: Edema (wrapped today at [facility] bilaterally. Denies erythema, has continued to have weeping. ) present.     Left lower leg: Edema present.  Lymphadenopathy:     Cervical: No cervical adenopathy.  Skin:    General: Skin is warm.     Findings: Bruising (arms and legs. ) present.       Neurological:     General: No focal deficit present.     Mental Status: She is oriented to person, place, and time.     Motor: No weakness.     Gait: Gait normal.  Psychiatric:        Mood and Affect: Mood normal.        Behavior: Behavior normal.        Thought Content: Thought content normal.        Judgment: Judgment normal.      No results found for any visits on 06/18/20.  Assessment & Plan     Chronic  obstructive pulmonary disease, unspecified COPD type ([place]) - Plan: Ambulatory referral to Pulmonology  Hematoma of right hip, sequela - Plan: CBC with Differential/Platelet, Comprehensive Metabolic Panel (CMET), B Nat Peptide, DG Chest 2 View, Ambulatory referral to Vascular Surgery, Ambulatory referral to Orthopedic Surgery  Pleural effusion - Plan: Ambulatory referral to Pulmonology  Dyspnea, unspecified type - Plan: Ambulatory referral to Pulmonology  Bilateral edema of lower extremity  Stage 3b chronic kidney disease ([place])  Closed fracture of right hand, sequela  Right hip pain  Hematoma of right thigh, subsequent encounter  Acute deep vein thrombosis (DVT) of tibial vein of left lower extremity (HCC)  Tricuspid valve insufficiency, unspecified etiology - Plan: Ambulatory referral to Cardiology  She will have Follow up with vascular given her DVT history and being of Eliquis due to fall. CT unclear as to right lateral thigh near greater trochanter if hematoma versus fatty area, patient does not endorse having this until after her initial fall.   She would like to see Dr. Marry Guan for follow up on hip.   Referral to local pulmonary per patient request. CHF referred to cardiology.   Orders Placed This Encounter  Procedures   DG Chest 2 View   CBC with Differential/Platelet   Comprehensive Metabolic Panel (CMET)   B Nat Peptide   Ambulatory referral to Vascular Surgery   Ambulatory referral to Orthopedic Surgery   Ambulatory referral to Pulmonology   Ambulatory referral to Cardiology   Red Flags discussed. The patient was given clear instructions to go to ER or return to medical center if any red flags develop, symptoms do not improve, worsen or new problems develop. They verbalized understanding.  Return in about 2 weeks (around 07/02/2020), or if symptoms worsen or fail to improve, for at any time for any worsening symptoms, Go to Emergency room/ urgent care if  worse.      Addressed acute and or chronic medical problems today requiring 55 minutes reviewing patients medical record,labs, counseling patient regarding patient's conditions, any medications, answering questions regarding health, and coordination of care as needed. After visit summary patient given copy and reviewed.   Marcille Buffy, [place] [phone] (phone) [phone] (fax)  [place]

## 2020-06-19 ENCOUNTER — Ambulatory Visit (INDEPENDENT_AMBULATORY_CARE_PROVIDER_SITE_OTHER): Payer: PPO | Admitting: Internal Medicine

## 2020-06-19 ENCOUNTER — Encounter: Payer: Self-pay | Admitting: Internal Medicine

## 2020-06-19 ENCOUNTER — Ambulatory Visit
Admission: RE | Admit: 2020-06-19 | Discharge: 2020-06-19 | Disposition: A | Discharge status: Home / Self Care | Payer: PPO | Attending: Adult Health | Admitting: Adult Health

## 2020-06-19 ENCOUNTER — Ambulatory Visit
Admission: RE | Admit: 2020-06-19 | Discharge: 2020-06-19 | Disposition: A | Discharge status: Home / Self Care | Payer: PPO | Source: Ambulatory Visit | Attending: Adult Health | Admitting: Adult Health

## 2020-06-19 ENCOUNTER — Other Ambulatory Visit: Payer: Self-pay

## 2020-06-19 VITALS — BP 120/60 | HR 68 | Temp 97.1°F | Ht 60.0 in | Wt 132.6 lb

## 2020-06-19 DIAGNOSIS — S7001XS Contusion of right hip, sequela: Secondary | ICD-10-CM | POA: Diagnosis not present

## 2020-06-19 DIAGNOSIS — J9811 Atelectasis: Secondary | ICD-10-CM | POA: Diagnosis not present

## 2020-06-19 DIAGNOSIS — F17210 Nicotine dependence, cigarettes, uncomplicated: Secondary | ICD-10-CM | POA: Diagnosis not present

## 2020-06-19 DIAGNOSIS — J449 Chronic obstructive pulmonary disease, unspecified: Secondary | ICD-10-CM | POA: Diagnosis not present

## 2020-06-19 DIAGNOSIS — I5032 Chronic diastolic (congestive) heart failure: Secondary | ICD-10-CM | POA: Diagnosis not present

## 2020-06-19 DIAGNOSIS — J9611 Chronic respiratory failure with hypoxia: Secondary | ICD-10-CM | POA: Diagnosis not present

## 2020-06-19 DIAGNOSIS — J90 Pleural effusion, not elsewhere classified: Secondary | ICD-10-CM

## 2020-06-19 LAB — CBC WITH DIFFERENTIAL/PLATELET
Basophils Absolute: 0 10*3/uL (ref 0.0–0.2)
Basos: 0 %
EOS (ABSOLUTE): 0 10*3/uL (ref 0.0–0.4)
Eos: 0 %
Hematocrit: 35.1 % (ref 34.0–46.6)
Hemoglobin: 11.8 g/dL (ref 11.1–15.9)
Immature Grans (Abs): 0.1 10*3/uL (ref 0.0–0.1)
Immature Granulocytes: 1 %
Lymphocytes Absolute: 0.8 10*3/uL (ref 0.7–3.1)
Lymphs: 11 %
MCH: 31.5 pg (ref 26.6–33.0)
MCHC: 33.6 g/dL (ref 31.5–35.7)
MCV: 94 fL (ref 79–97)
Monocytes Absolute: 0.3 10*3/uL (ref 0.1–0.9)
Monocytes: 5 %
Neutrophils Absolute: 5.7 10*3/uL (ref 1.4–7.0)
Neutrophils: 83 %
Platelets: 373 10*3/uL (ref 150–450)
RBC: 3.75 x10E6/uL — ABNORMAL LOW (ref 3.77–5.28)
RDW: 16.1 % — ABNORMAL HIGH (ref 11.7–15.4)
WBC: 6.9 10*3/uL (ref 3.4–10.8)

## 2020-06-19 LAB — COMPREHENSIVE METABOLIC PANEL
ALT: 30 IU/L (ref 0–32)
AST: 17 IU/L (ref 0–40)
Albumin/Globulin Ratio: 2.1 (ref 1.2–2.2)
Albumin: 4.2 g/dL (ref 3.7–4.7)
Alkaline Phosphatase: 144 IU/L — ABNORMAL HIGH (ref 44–121)
BUN/Creatinine Ratio: 24 (ref 12–28)
BUN: 23 mg/dL (ref 8–27)
Bilirubin Total: 0.4 mg/dL (ref 0.0–1.2)
CO2: 27 mmol/L (ref 20–29)
Calcium: 9.6 mg/dL (ref 8.7–10.3)
Chloride: 97 mmol/L (ref 96–106)
Creatinine, Ser: 0.95 mg/dL (ref 0.57–1.00)
GFR calc Af Amer: 68 mL/min/{1.73_m2} (ref >59)
GFR calc non Af Amer: 59 mL/min/{1.73_m2} — ABNORMAL LOW (ref >59)
Globulin, Total: 2 g/dL (ref 1.5–4.5)
Glucose: 100 mg/dL — ABNORMAL HIGH (ref 65–99)
Potassium: 4.6 mmol/L (ref 3.5–5.2)
Sodium: 137 mmol/L (ref 134–144)
Total Protein: 6.2 g/dL (ref 6.0–8.5)

## 2020-06-19 LAB — BRAIN NATRIURETIC PEPTIDE: BNP: 154.5 pg/mL — ABNORMAL HIGH (ref 0.0–100.0)

## 2020-06-19 NOTE — Patient Instructions (Addendum)
Continue inhalers as PRESCRIBED Please stop smoking Check ONO and 6MWT CARDIOLOGY REFERRAL FOR DIASTOLIC HEART FAILURE

## 2020-06-19 NOTE — Progress Notes (Signed)
Name: Mariah [name] MRN: [id number] DOB: 1945/08/02     CONSULTATION DATE: 06/19/2020  REFERRING MD : Verlon Au  CHIEF COMPLAINT: SOB  STUDIES:     CT chest Independently reviewed by Me 06/19/2020   HISTORY OF PRESENT ILLNESS: 74 year old white female Recent diagnosis DVT about 05/08/2020 on Eliquis with  HTN,,AAA,Psoriasis,CKD 3,Reflux with ongoing  Tobacco abuse  Patient with Recent admission 05/26/2020 through 05/28/2020 found to have hemoglobin 6.8 secondary to large right thigh hematoma with fall-she also had a finger fracture at the time orthopedics-recommended holding Eliquis/aspirin found to have left pleural effusion at the time and received IV Lasix  Readmitted with shortness of breath 11/14- found to have new worsening pleural effusion Pulmonology saw the patient and patient underwent thoracentesis 600 cc clear yellow fluid Findings c/w Transudate effusions likley related to Meta.   DX of Acute superimposed on chronic diastolic heart failure echo 05/27/2020 EF 60-65% DVT 89/3810 complicated by right thigh hematoma Patient was discharged on oxygen   No exacerbation at this time No evidence of heart failure at this time No evidence or signs of infection at this time No respiratory distress No fevers, chills, nausea, vomiting, diarrhea No evidence of lower extremity edema No evidence hemoptysis   Smoking Assessment and Cessation Counseling   Upon further questioning, Patient smokes 1 ppd  I have advised patient to quit/stop smoking as soon as possible due to high risk for multiple medical problems  Patient  is NOT willing to quit smoking  I have advised patient that we can assist and have options of Nicotine replacement therapy. I also advised patient on behavioral therapy and can provide oral medication therapy in conjunction with the other therapies  Follow up next Office visit  for assessment of smoking cessation  Smoking cessation counseling advised for 4  minutes    PAST MEDICAL HISTORY :   has a past medical history of Abdominal aortic aneurysm (AAA) ([place]), Arthritis, COPD (chronic obstructive pulmonary disease) ([place]), GERD (gastroesophageal reflux disease), Headache, History of adenomatous polyp of colon, Hyperlipidemia, Hypertension, Psoriasis, Right lumbar radiculitis, and Vitamin D deficiency.  has a past surgical history that includes Appendectomy (2000); Foot surgery (Bilateral); ovarian tumor (1976); Total hip arthroplasty (Right, 01/27/2016); Colonoscopy with propofol (N/A, 08/10/2016); Esophagogastroduodenoscopy (egd) with propofol (N/A, 08/10/2016); Joint replacement (Right, 01/27/2016); Breast lumpectomy with needle localization (Right, 11/24/2016); Breast biopsy (Right, 09/2016); and Breast lumpectomy (Right, 11/24/2016). Prior to Admission medications   Medication Sig Start Date End Date Taking? Authorizing Provider  acetaminophen (TYLENOL) 325 MG tablet Take 2 tablets (650 mg total) by mouth every 6 (six) hours as needed for mild pain (or Fever >/= 101). 06/06/20   Nita Sells, MD  albuterol (VENTOLIN HFA) 108 (90 Base) MCG/ACT inhaler INHALE 1-2 PUFFS INTO THE LUNGS EVERY 6 HOURS AS NEEDED FOR WHEEZING OR SHORTNESS OF BREATH. 05/22/20   Carles Collet M, PA-C  apixaban (ELIQUIS) 5 MG TABS tablet Take 1 tablet (5 mg total) by mouth 2 (two) times daily. 06/09/20   Nita Sells, MD  ascorbic acid (VITAMIN C) 250 MG tablet Take 1 tablet (250 mg total) by mouth 2 (two) times daily. 06/06/20   Nita Sells, MD  aspirin EC 81 MG tablet Take 81 mg by mouth daily. Swallow whole.    [provider]  betamethasone dipropionate (DIPROLENE) 0.05 % ointment Apply topically daily. 01/01/20   Ralene Bathe, MD  calcipotriene (DOVONOX) 0.005 % cream APPLY TO SKIN ONCE A DAY APPLY DAILY ON HANDS, FEET AND  WRIST X 2 WEEKS, THEN USE 5 DAYS A WEEK 01/01/20   Ralene Bathe, MD  Cholecalciferol (VITAMIN D3) 2000 units TABS  Take 2,000 Units by mouth daily.    [provider]  clobetasol (TEMOVATE) 0.05 % external solution APPLY TO AFFECTED AREA TWICE A DAY 12/03/19   Bacigalupo, Dionne Bucy, MD  COSENTYX SENSOREADY, 300 MG, 150 MG/ML SOAJ Inject 2 Syringes into the skin every 28 (twenty-eight) days. 05/31/20   [provider]  donepezil (ARICEPT) 5 MG tablet Take 5 mg by mouth at bedtime.    [provider]  ferrous [id number] C-folic acid (TRINSICON / FOLTRIN) capsule Take 1 capsule by mouth 2 (two) times daily after a meal. 05/28/20   Lorella Nimrod, MD  ferrous sulfate 325 (65 FE) MG tablet Take 325 mg by mouth 2 (two) times daily. 05/28/20   [provider]  fluticasone furoate-vilanterol (BREO ELLIPTA) 100-25 MCG/INH AEPB Inhale 1 puff into the lungs daily. 05/22/20   Trinna Post, PA-C  furosemide (LASIX) 40 MG tablet Take 1 tablet (40 mg total) by mouth daily. 06/07/20   Nita Sells, MD  gabapentin (NEURONTIN) 300 MG capsule TAKE 1 CAPSULE BY MOUTH TWICE A DAY Patient taking differently: Take 300 mg by mouth 2 (two) times daily. TAKE 1 CAPSULE BY MOUTH TWICE A DAY 05/07/20   Bacigalupo, Dionne Bucy, MD  guaiFENesin (MUCINEX) 600 MG 12 hr tablet Take 1 tablet (600 mg total) by mouth 2 (two) times daily. 06/06/20   Nita Sells, MD  levocetirizine (XYZAL) 5 MG tablet TAKE 1 TABLET BY MOUTH EVERY DAY IN THE EVENING Patient taking differently: Take 5 mg by mouth every evening.  12/03/19   [name], MD  methotrexate (RHEUMATREX) 2.5 MG tablet Take 15 mg by mouth once a week.  05/23/20   [provider]  metoprolol succinate (TOPROL-XL) 50 MG 24 hr tablet TAKE 1 TABLET (50 MG TOTAL) BY MOUTH DAILY. TAKE WITH OR IMMEDIATELY FOLLOWING A MEAL. 03/27/20   Bacigalupo, Dionne Bucy, MD  nicotine (NICODERM CQ - DOSED IN MG/24 HR) 7 mg/24hr patch Place 1 patch (7 mg total) onto the skin daily. 06/07/20   Nita Sells, MD  pantoprazole (PROTONIX) 20  MG tablet TAKE 1 TABLET (20 MG TOTAL) BY MOUTH 2 (TWO) TIMES DAILY BEFORE A MEAL. 12/28/19   Bacigalupo, Dionne Bucy, MD  potassium chloride SA (KLOR-CON) 20 MEQ tablet Take 2 tablets (40 mEq total) by mouth daily. 06/06/20   Nita Sells, MD  predniSONE (DELTASONE) 20 MG tablet Take 1 tablet (20 mg total) by mouth daily before breakfast. 06/07/20   Nita Sells, MD  rOPINIRole (REQUIP) 1 MG tablet TAKE 1 TABLET (1 MG TOTAL) BY MOUTH AT BEDTIME. 12/28/19   Bacigalupo, Dionne Bucy, MD  simvastatin (ZOCOR) 20 MG tablet TAKE 1 TABLET BY MOUTH EVERY DAY Patient taking differently: Take 20 mg by mouth daily at 6 PM.  10/01/19   Bacigalupo, Dionne Bucy, MD  traMADol (ULTRAM) 50 MG tablet TAKE 1 TABLET BY MOUTH EVERY 6 HOURS AS NEEDED FOR MODERATE OR SEVERE PAIN 06/06/20   Nita Sells, MD   Allergies  Allergen Reactions  . Clindamycin/Lincomycin     diarrhea  . Amoxicillin Diarrhea    FAMILY HISTORY:  family history includes Arthritis in her father; COPD in her sister; Cancer in her mother; Colon cancer in her paternal aunt, paternal grandmother, and paternal uncle; Fibromyalgia in her sister; Heart disease in her father; Osteoarthritis in her maternal aunt and sister.  SOCIAL HISTORY:  reports that she has been smoking. She has been smoking about 1.00 pack per day for the past 0.00 years. She has never used smokeless tobacco. She reports current alcohol use. She reports that she does not use drugs.    Review of Systems:  Gen:  Denies  fever, sweats, chills weigh loss  HEENT: Denies blurred vision, double vision, ear pain, eye pain, hearing loss, nose bleeds, sore throat Cardiac:  No dizziness, chest pain or heaviness, chest tightness,edema, No JVD Resp:   No cough, -sputum production, +shortness of breath,-wheezing, -hemoptysis,  Gi: Denies swallowing difficulty, stomach pain, nausea or vomiting, diarrhea, constipation, bowel incontinence Gu:  Denies bladder incontinence, burning  urine Ext:   Denies Joint pain, stiffness or swelling Skin: Denies  skin rash, easy bruising or bleeding or hives Endoc:  Denies polyuria, polydipsia , polyphagia or weight change Psych:   Denies depression, insomnia or hallucinations  Other:  All other systems negative     BP 120/60 (BP Location: Left Arm, Cuff Size: Normal)   Pulse 68   Temp (!) 97.1 F (36.2 C) (Temporal)   Ht 5' (1.524 m)   Wt 132 lb 9.6 oz (60.1 kg)   SpO2 98%   BMI 25.90 kg/m     Physical Examination:   GENERAL:NAD, no fevers, chills, no weakness no fatigue HEAD: Normocephalic, atraumatic.  EYES: PERLA, EOMI No scleral icterus.  MOUTH: Moist mucosal membrane.  EAR, NOSE, THROAT: Clear without exudates. No external lesions.  NECK: Supple.  PULMONARY: CTA B/L no wheezing, rhonchi, +crackles CARDIOVASCULAR: S1 and S2. Regular rate and rhythm. No murmurs GASTROINTESTINAL: Soft, nontender, nondistended. Positive bowel sounds.  MUSCULOSKELETAL: No swelling, clubbing, or edema.  NEUROLOGIC: No gross focal neurological deficits. 5/5 strength all extremities SKIN: No ulceration, lesions, rashes, or cyanosis.  PSYCHIATRIC: Insight, judgment intact. -depression -anxiety ALL OTHER ROS ARE NEGATIVE   MEDICATIONS: I have reviewed all medications and confirmed regimen as documented   CBC    Component Value Date/Time   WBC 6.9 06/18/2020 1604   WBC 3.5 (L) 06/06/2020 0705   RBC 3.75 (L) 06/18/2020 1604   RBC 3.36 (L) 06/06/2020 0705   HGB 11.8 06/18/2020 1604   HCT 35.1 06/18/2020 1604   PLT 373 06/18/2020 1604   MCV 94 06/18/2020 1604   MCV 92 12/21/2013 1110   MCH 31.5 06/18/2020 1604   MCH 30.7 06/06/2020 0705   MCHC 33.6 06/18/2020 1604   MCHC 33.3 06/06/2020 0705   RDW 16.1 (H) 06/18/2020 1604   RDW 13.3 12/21/2013 1110   LYMPHSABS 0.8 06/18/2020 1604   LYMPHSABS 1.0 12/21/2013 1110   MONOABS 0.4 06/06/2020 0705   MONOABS 0.4 12/21/2013 1110   EOSABS 0.0 06/18/2020 1604   EOSABS 0.1  12/21/2013 1110   BASOSABS 0.0 06/18/2020 1604   BASOSABS 0.0 12/21/2013 1110   BMP Latest Ref Rng & Units 06/18/2020 06/06/2020 06/05/2020  Glucose 65 - 99 mg/dL 100(H) 88 99  BUN 8 - 27 mg/dL 23 18 14   Creatinine 0.57 - 1.00 mg/dL 0.95 1.15(H) 1.12(H)  BUN/Creat Ratio 12 - 28 24 - -  Sodium 134 - 144 mmol/L 137 136 136  Potassium 3.5 - 5.2 mmol/L 4.6 3.1(L) 3.3(L)  Chloride 96 - 106 mmol/L 97 94(L) 92(L)  CO2 20 - 29 mmol/L 27 30 34(H)  Calcium 8.7 - 10.3 mg/dL 9.6 8.5(L) 8.8(L)         IMAGING  CT chest 06/02/2020 Bilateral pleural effusions Bilateral lower lobe atelectasis  ASSESSMENT AND PLAN SYNOPSIS 74 year old white female with a diagnosis of COPD with chronic hypoxic respiratory failure on oxygen therapy recently admitted for increased work of breathing most likely related to acute diastolic heart failure with bilateral pleural effusions status post thoracentesis reveals fluid analysis suggestive of transudate of process.  At this time her pleural effusions are related to cardiac and acute renal disease  Pleural effusions related to acute diastolic heart failure Recommend cardiology referral for further assessment   COPD At this point patient is Gold stage D with chronic hypoxic respiratory failure  ASSESS FOR NOCTURNAL HYPOXIA with ONO ASSESS FOR EXERTIONAL HYPOXIA WITH 6WMT    Smoking cessation strongly advised     COVID-19 EDUCATION: The signs and symptoms of COVID-19 were discussed with the patient and how to seek care for testing.  The importance of social distancing was discussed today. Hand Washing Techniques and avoid touching face was advised.     MEDICATION ADJUSTMENTS/LABS AND TESTS ORDERED: Continue inhalers as PRESCRIBED Please stop smoking Check ONO and 6MWT CARDIOLOGY REFERRAL FOR DIASTOLIC HEART FAILURE    CURRENT MEDICATIONS REVIEWED AT LENGTH WITH PATIENT TODAY   Patient satisfied with Plan of action and management.  All questions answered  Follow up in 6 months  TOTAL TIME SPENT 47 mins  Joanette Silveria Patricia Pesa, M.D.  [facility]  Medical Director [place] Director [facility] Cardio-Pulmonary Department

## 2020-06-19 NOTE — Progress Notes (Signed)
Labs stable compared to discharge, keep follow up appointment. Return to office sooner if any symptoms worsening at anytime.

## 2020-06-19 NOTE — Progress Notes (Signed)
Chest x ray stable and improving.

## 2020-06-20 NOTE — Addendum Note (Signed)
Addended by: Claudette Head A on: 06/20/2020 12:25 PM   Modules accepted: Orders

## 2020-06-21 ENCOUNTER — Telehealth: Payer: Self-pay

## 2020-06-21 DIAGNOSIS — E43 Unspecified severe protein-calorie malnutrition: Secondary | ICD-10-CM | POA: Diagnosis not present

## 2020-06-21 DIAGNOSIS — N183 Chronic kidney disease, stage 3 unspecified: Secondary | ICD-10-CM | POA: Diagnosis not present

## 2020-06-21 DIAGNOSIS — D631 Anemia in chronic kidney disease: Secondary | ICD-10-CM | POA: Diagnosis not present

## 2020-06-21 DIAGNOSIS — M5416 Radiculopathy, lumbar region: Secondary | ICD-10-CM | POA: Diagnosis not present

## 2020-06-21 DIAGNOSIS — F0150 Vascular dementia without behavioral disturbance: Secondary | ICD-10-CM | POA: Diagnosis not present

## 2020-06-21 DIAGNOSIS — F17210 Nicotine dependence, cigarettes, uncomplicated: Secondary | ICD-10-CM | POA: Diagnosis not present

## 2020-06-21 DIAGNOSIS — Z7901 Long term (current) use of anticoagulants: Secondary | ICD-10-CM | POA: Diagnosis not present

## 2020-06-21 DIAGNOSIS — I130 Hypertensive heart and chronic kidney disease with heart failure and stage 1 through stage 4 chronic kidney disease, or unspecified chronic kidney disease: Secondary | ICD-10-CM | POA: Diagnosis not present

## 2020-06-21 DIAGNOSIS — E559 Vitamin D deficiency, unspecified: Secondary | ICD-10-CM | POA: Diagnosis not present

## 2020-06-21 DIAGNOSIS — Z7982 Long term (current) use of aspirin: Secondary | ICD-10-CM | POA: Diagnosis not present

## 2020-06-21 DIAGNOSIS — Z7951 Long term (current) use of inhaled steroids: Secondary | ICD-10-CM | POA: Diagnosis not present

## 2020-06-21 DIAGNOSIS — Z7952 Long term (current) use of systemic steroids: Secondary | ICD-10-CM | POA: Diagnosis not present

## 2020-06-21 DIAGNOSIS — I714 Abdominal aortic aneurysm, without rupture: Secondary | ICD-10-CM | POA: Diagnosis not present

## 2020-06-21 DIAGNOSIS — S7011XD Contusion of right thigh, subsequent encounter: Secondary | ICD-10-CM | POA: Diagnosis not present

## 2020-06-21 DIAGNOSIS — K219 Gastro-esophageal reflux disease without esophagitis: Secondary | ICD-10-CM | POA: Diagnosis not present

## 2020-06-21 DIAGNOSIS — E785 Hyperlipidemia, unspecified: Secondary | ICD-10-CM | POA: Diagnosis not present

## 2020-06-21 DIAGNOSIS — I5042 Chronic combined systolic (congestive) and diastolic (congestive) heart failure: Secondary | ICD-10-CM | POA: Diagnosis not present

## 2020-06-21 DIAGNOSIS — M6281 Muscle weakness (generalized): Secondary | ICD-10-CM | POA: Diagnosis not present

## 2020-06-21 DIAGNOSIS — L409 Psoriasis, unspecified: Secondary | ICD-10-CM | POA: Diagnosis not present

## 2020-06-21 DIAGNOSIS — J9611 Chronic respiratory failure with hypoxia: Secondary | ICD-10-CM | POA: Diagnosis not present

## 2020-06-21 DIAGNOSIS — S62636D Displaced fracture of distal phalanx of right little finger, subsequent encounter for fracture with routine healing: Secondary | ICD-10-CM | POA: Diagnosis not present

## 2020-06-21 DIAGNOSIS — I82442 Acute embolism and thrombosis of left tibial vein: Secondary | ICD-10-CM | POA: Diagnosis not present

## 2020-06-21 DIAGNOSIS — M1990 Unspecified osteoarthritis, unspecified site: Secondary | ICD-10-CM | POA: Diagnosis not present

## 2020-06-21 DIAGNOSIS — G2581 Restless legs syndrome: Secondary | ICD-10-CM | POA: Diagnosis not present

## 2020-06-21 DIAGNOSIS — J449 Chronic obstructive pulmonary disease, unspecified: Secondary | ICD-10-CM | POA: Diagnosis not present

## 2020-06-21 NOTE — Telephone Encounter (Signed)
Copied from [place] [id number]. Topic: Quick Communication - Home Health Verbal Orders >> Jun 21, 2020 12:22 PM Lise Auer wrote: Caller/Agency: [place] Number: [phone] [place] at [facility] Requesting OT/PT/Skilled Nursing/Social Work/Speech Therapy: PT Frequency: 1 week 1 and 2 week 3 and 1 week 1 also a OT evaluation

## 2020-06-24 ENCOUNTER — Other Ambulatory Visit: Payer: Self-pay

## 2020-06-24 ENCOUNTER — Encounter: Payer: Self-pay | Admitting: Cardiovascular Disease

## 2020-06-24 ENCOUNTER — Ambulatory Visit (INDEPENDENT_AMBULATORY_CARE_PROVIDER_SITE_OTHER): Payer: PPO | Admitting: Cardiovascular Disease

## 2020-06-24 VITALS — BP 100/60 | HR 81 | Ht 60.0 in | Wt 133.0 lb

## 2020-06-24 DIAGNOSIS — I5032 Chronic diastolic (congestive) heart failure: Secondary | ICD-10-CM

## 2020-06-24 DIAGNOSIS — E7800 Pure hypercholesterolemia, unspecified: Secondary | ICD-10-CM

## 2020-06-24 DIAGNOSIS — I714 Abdominal aortic aneurysm, without rupture, unspecified: Secondary | ICD-10-CM

## 2020-06-24 DIAGNOSIS — I739 Peripheral vascular disease, unspecified: Secondary | ICD-10-CM | POA: Diagnosis not present

## 2020-06-24 DIAGNOSIS — I272 Pulmonary hypertension, unspecified: Secondary | ICD-10-CM | POA: Diagnosis not present

## 2020-06-24 DIAGNOSIS — I10 Essential (primary) hypertension: Secondary | ICD-10-CM | POA: Diagnosis not present

## 2020-06-24 MED ORDER — NICOTINE 21 MG/24HR TD PT24
21.0000 mg | MEDICATED_PATCH | Freq: Every day | TRANSDERMAL | 3 refills | Status: DC
Start: 2020-06-24 — End: 2020-08-20

## 2020-06-24 MED ORDER — FUROSEMIDE 40 MG PO TABS
40.0000 mg | ORAL_TABLET | Freq: Every day | ORAL | 3 refills | Status: DC
Start: 2020-06-24 — End: 2020-07-01

## 2020-06-24 NOTE — Patient Instructions (Addendum)
Daily weights, call with numbers  Medication Instructions:  Please restart lasix 40 mg daily   If you need a refill on your cardiac medications before your next appointment, please call your pharmacy.    Lab work: No new labs needed   If you have labs (blood work) drawn today and your tests are completely normal, you will receive your results only by: [name] MyChart Message (if you have MyChart) OR . A paper copy in the mail If you have any lab test that is abnormal or we need to change your treatment, we will call you to review the results.   Testing/Procedures: No new testing needed   Follow-Up: At [facility], you and your health needs are our priority.  As part of our continuing mission to provide you with exceptional heart care, we have created designated Provider Care Teams.  These Care Teams include your primary Cardiologist (physician) and Advanced Practice Providers (APPs -  Physician Assistants and Nurse Practitioners) who all work together to provide you with the care you need, when you need it.  . You will need a follow up appointment in 1 month  . Providers on your designated Care Team:   . Murray Hodgkins, NP . Christell Faith, PA-C . Marrianne Mood, PA-C  Any Other Special Instructions Will Be Listed Below (If Applicable).  COVID-19 Vaccine Information can be found at: ShippingScam.co.uk For questions related to vaccine distribution or appointments, please email vaccine@[email or web address] [email or web address] or call [phone] [phone].   Other: Daily weights Suspect goal weight 130 pounds  Daily Weight Record It is important to weigh yourself daily. To do this:  Make sure you use a reliable scale. Use the same scale each day.  Keep this daily weight chart near your scale.  Weigh yourself each morning at the same time.  Before weighing yourself: ? Take off your shoes. ? Make sure you are wearing the same amount of clothing  each day.  Write down your weight in the spaces on the form.  Compare today's weight to yesterday's weight.  Bring this form with you to your follow-up visits with your health care provider. Call your health care provider if you have concerns about your weight, including rapid weight gain or loss.      Date: ________ Weight: ____________________ Date: ________ Weight: ____________________ Date: ________ Weight: ____________________ Date: ________ Weight: ____________________ Date: ________ Weight: ____________________ Date: ________ Weight: ____________________ Date: ________ Weight: ____________________ Date: ________ Weight: ____________________ Date: ________ Weight: ____________________ Date: ________ Weight: ____________________ Date: ________ Weight: ____________________ Date: ________ Weight: ____________________ Date: ________ Weight: ____________________ Date: ________ Weight: ____________________ Date: ________ Weight: ____________________ Date: ________ Weight: ____________________ Date: ________ Weight: ____________________ Date: ________ Weight: ____________________ Date: ________ Weight: ____________________ Date: ________ Weight: ____________________ Date: ________ Weight: ____________________ Date: ________ Weight: ____________________ Date: ________ Weight: ____________________ Date: ________ Weight: ____________________ Date: ________ Weight: ____________________ Date: ________ Weight: ____________________ Date: ________ Weight: ____________________ Date: ________ Weight: ____________________ Date: ________ Weight: ____________________ Date: ________ Weight: ____________________ Date: ________ Weight: ____________________ Date: ________ Weight: ____________________ Date: ________ Weight: ____________________ Date: ________ Weight: ____________________ Date: ________ Weight: ____________________ Date: ________ Weight: ____________________ Date: ________ Weight:  ____________________ Date: ________ Weight: ____________________ Date: ________ Weight: ____________________ Date: ________ Weight: ____________________ Date: ________ Weight: ____________________ Date: ________ Weight: ____________________ Date: ________ Weight: ____________________ Date: ________ Weight: ____________________ Date: ________ Weight: ____________________ Date: ________ Weight: ____________________ Date: ________ Weight: ____________________ Date: ________ Weight: ____________________ Date: ________ Weight: ____________________ Date: ________ Weight: ____________________

## 2020-06-24 NOTE — Telephone Encounter (Signed)
OK for verbals 

## 2020-06-24 NOTE — Progress Notes (Signed)
Cardiology Office Note  Date:  06/24/2020   ID:  Mariah [name], DOB 1946/01/22, MRN [id number]  PCP:  [name], MD   Chief Complaint  Patient presents with  . New Patient (Initial Visit)    Referred by Dr. Patsey Berthold Chronic diastolic heart failure. Patient c.o Swelling in legs. Meds reviewed verbally with patient.     HPI:  Mariah [name] is a 74 year old woman with past medical history of COPD, smoker Anemia DVT October 2021 on Eliquis Chronic kidney disease AAA Who presents by referral from Dr. Patsey Berthold for chronic diastolic CHF, tricuspid valve regurgitation  Hospital admission May 26, 2020 for anemia hemoglobin 6.8, large right thigh hematoma after fall Eliquis aspirin held Also with left pleural effusion at the time receiving IV Lasix  Readmitted to the hospital June 02, 2020 worsening pleural effusion Underwent thoracentesis 600 cc out  Continues to smoke 1 pack/day  Echocardiogram November 2021 ejection fraction 60 to 65% Normal RV function, moderately elevated right heart pressures, estimated 49 mmHg plus right atrial pressure, roughly 55 mmHg Question of pleural effusion Mild to moderate TR  Ran out of lasix 2 days ago  EKG personally reviewed by myself on todays visit  NSR rate 81 bpm, pacs, left axis deviation   PMH:   has a past medical history of Abdominal aortic aneurysm (AAA) ([place]), Arthritis, COPD (chronic obstructive pulmonary disease) ([place]), GERD (gastroesophageal reflux disease), Headache, History of adenomatous polyp of colon, Hyperlipidemia, Hypertension, Psoriasis, Right lumbar radiculitis, and Vitamin D deficiency.  PSH:    Past Surgical History:  Procedure Laterality Date  . APPENDECTOMY  2000  . BREAST BIOPSY Right 09/2016   radial scar;COMPLEX SCLEROSING LESION  . BREAST LUMPECTOMY Right 11/24/2016   COMPLEX SCLEROSING LESION  . BREAST LUMPECTOMY WITH NEEDLE LOCALIZATION Right 11/24/2016   Procedure: BREAST LUMPECTOMY WITH  NEEDLE LOCALIZATION;  Surgeon: Leonie Green, MD;  Location: [facility] ORS;  Service: General;  Laterality: Right;  . COLONOSCOPY WITH PROPOFOL N/A 08/10/2016   Procedure: COLONOSCOPY WITH PROPOFOL;  Surgeon: Manya Silvas, MD;  Location: [facility] ENDOSCOPY;  Service: Endoscopy;  Laterality: N/A;  . ESOPHAGOGASTRODUODENOSCOPY (EGD) WITH PROPOFOL N/A 08/10/2016   Procedure: ESOPHAGOGASTRODUODENOSCOPY (EGD) WITH PROPOFOL;  Surgeon: Manya Silvas, MD;  Location: [facility] ENDOSCOPY;  Service: Endoscopy;  Laterality: N/A;  . FOOT SURGERY Bilateral   . JOINT REPLACEMENT Right 01/27/2016   hip  . 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 TOTAL HIP ARTHROPLASTY Right 01/27/2016   Procedure: TOTAL HIP ARTHROPLASTY;  Surgeon: Dereck Leep, MD;  Location: [facility] ORS;  Service: Orthopedics;  Laterality: Right;    Current Outpatient Medications  Medication Sig Dispense Refill  . acetaminophen (TYLENOL) 325 MG tablet Take 2 tablets (650 mg total) by mouth every 6 (six) hours as needed for mild pain (or Fever >/= 101).    [name] albuterol (VENTOLIN HFA) 108 (90 Base) MCG/ACT inhaler INHALE 1-2 PUFFS INTO THE LUNGS EVERY 6 HOURS AS NEEDED FOR WHEEZING OR SHORTNESS OF BREATH. 8 g 0  . apixaban (ELIQUIS) 5 MG TABS tablet Take 1 tablet (5 mg total) by mouth 2 (two) times daily. 60 tablet 3  . ascorbic acid (VITAMIN C) 250 MG tablet Take 1 tablet (250 mg total) by mouth 2 (two) times daily.    [name] aspirin EC 81 MG tablet Take 81 mg by mouth daily. Swallow whole.    . betamethasone dipropionate (DIPROLENE) 0.05 % ointment Apply topically daily. 45 g 0  . calcipotriene (DOVONOX)  0.005 % cream APPLY TO SKIN ONCE A DAY APPLY DAILY ON HANDS, FEET AND WRIST X 2 WEEKS, THEN USE 5 DAYS A WEEK 60 g 0  . Cholecalciferol (VITAMIN D3) 2000 units TABS Take 2,000 Units by mouth daily.    . clobetasol (TEMOVATE) 0.05 % external solution APPLY TO AFFECTED AREA TWICE A DAY 50 mL 2  . COSENTYX SENSOREADY, 300 MG, 150 MG/ML SOAJ Inject 2 Syringes  into the skin every 28 (twenty-eight) days.    [name] donepezil (ARICEPT) 5 MG tablet Take 5 mg by mouth at bedtime.    . ferrous [id number] C-folic acid (TRINSICON / FOLTRIN) capsule Take 1 capsule by mouth 2 (two) times daily after a meal. 60 capsule 3  . ferrous sulfate 325 (65 FE) MG tablet Take 325 mg by mouth 2 (two) times daily.    . fluticasone furoate-vilanterol (BREO ELLIPTA) 100-25 MCG/INH AEPB Inhale 1 puff into the lungs daily. 60 each 0  . furosemide (LASIX) 40 MG tablet Take 1 tablet (40 mg total) by mouth daily. 30 tablet 0  . gabapentin (NEURONTIN) 300 MG capsule TAKE 1 CAPSULE BY MOUTH TWICE A DAY 180 capsule 1  . guaiFENesin (MUCINEX) 600 MG 12 hr tablet Take 1 tablet (600 mg total) by mouth 2 (two) times daily.    [name] levocetirizine (XYZAL) 5 MG tablet TAKE 1 TABLET BY MOUTH EVERY DAY IN THE EVENING 90 tablet 4  . methotrexate (RHEUMATREX) 2.5 MG tablet Take 15 mg by mouth once a week.     . metoprolol succinate (TOPROL-XL) 50 MG 24 hr tablet TAKE 1 TABLET (50 MG TOTAL) BY MOUTH DAILY. TAKE WITH OR IMMEDIATELY FOLLOWING A MEAL. 90 tablet 0  . pantoprazole (PROTONIX) 20 MG tablet TAKE 1 TABLET (20 MG TOTAL) BY MOUTH 2 (TWO) TIMES DAILY BEFORE A MEAL. 180 tablet 1  . potassium chloride SA (KLOR-CON) 20 MEQ tablet Take 2 tablets (40 mEq total) by mouth daily. 5 tablet   . rOPINIRole (REQUIP) 1 MG tablet TAKE 1 TABLET (1 MG TOTAL) BY MOUTH AT BEDTIME. 90 tablet 1  . simvastatin (ZOCOR) 20 MG tablet TAKE 1 TABLET BY MOUTH EVERY DAY 90 tablet 3  . traMADol (ULTRAM) 50 MG tablet TAKE 1 TABLET BY MOUTH EVERY 6 HOURS AS NEEDED FOR MODERATE OR SEVERE PAIN 100 tablet 2  . nicotine (NICODERM CQ - DOSED IN MG/24 HR) 7 mg/24hr patch Place 1 patch (7 mg total) onto the skin daily. (Patient not taking: Reported on 06/19/2020) 28 patch 0   No current facility-administered medications for this visit.     Allergies:   Clindamycin/lincomycin and Amoxicillin   Social History:  The patient   reports that she has been smoking. She has a 60.00 pack-year smoking history. She has never used smokeless tobacco. She reports current alcohol use. She reports that she does not use drugs.   Family History:   family history includes Arthritis in her father; COPD in her sister; Cancer in her mother; Colon cancer in her paternal aunt, paternal grandmother, and paternal uncle; Fibromyalgia in her sister; Heart disease in her father; Osteoarthritis in her maternal aunt and sister.    Review of Systems: Review of Systems  Constitutional: Negative.   HENT: Negative.   Respiratory: Negative.   Cardiovascular: Positive for leg swelling.  Gastrointestinal: Negative.   Musculoskeletal: Negative.   Neurological: Negative.   Psychiatric/Behavioral: Negative.   All other systems reviewed and are negative.    PHYSICAL EXAM: VS:  BP 100/60 (BP  Location: Right Arm, Patient Position: Sitting, Cuff Size: Normal)   Pulse 81   Ht 5' (1.524 m)   Wt 133 lb (60.3 kg)   BMI 25.97 kg/m  , BMI Body mass index is 25.97 kg/m. GEN: Well nourished, well developed, in no acute distress presents in a wheelchair HEENT: normal Neck: no JVD, carotid bruits, or masses Cardiac: RRR; no murmurs, rubs, or gallops,no edema  Respiratory:  clear to auscultation bilaterally, normal work of breathing GI: soft, nontender, nondistended, + BS MS: no deformity or atrophy Skin: warm and dry, no rash Neuro:  Strength and sensation are intact Psych: euthymic mood, full affect  Recent Labs: 06/03/2020: TSH 3.167 06/06/2020: Magnesium 2.0 06/18/2020: ALT 30; BNP 154.5; BUN 23; Creatinine, Ser 0.95; Hemoglobin 11.8; Platelets 373; Potassium 4.6; Sodium 137    Lipid Panel Lab Results  Component Value Date   CHOL 163 08/08/2019   HDL 53 08/08/2019   LDLCALC 85 08/08/2019   TRIG 146 08/08/2019      Wt Readings from Last 3 Encounters:  06/24/20 133 lb (60.3 kg)  06/19/20 132 lb 9.6 oz (60.1 kg)  06/18/20 132 lb  (59.9 kg)     ASSESSMENT AND PLAN:  Problem List Items Addressed This Visit      Cardiology Problems   Congestive heart failure (HCC)   Essential (primary) hypertension   Hypercholesteremia   Abdominal aortic aneurysm (AAA) ([place])   Peripheral arterial disease ([place]) - Primary    Other Visit Diagnoses    Pulmonary hypertension, unspecified (HCC)         Chronic diastolic CHF Out of Lasix past 2 days, new prescription has been sent in Does not have a scale at home, recommended to her daughter who presents with her today that she buy a scale Elevated right heart pressures/pulmonary hypertension consistent with COPD Has leg wraps in place, has not been changed since [facility] Referral placed to wound clinic for them to follow weeping sores She does report breathing close to baseline on Lasix 40 daily, leg edema has been improving She has stable renal function, With this in mind I am inclined to keep the Lasix 40 daily proceeding Recommend she call us with her weights, suspect she is still 3 4 pounds above her dry weight Discussed fluid and salt retention Blood pressure very low on today's visit but she denies any orthostasis symptoms.  We will need to monitor closely On clinical exam no suspicion of recurrent pleural effusion    Total encounter time more than 60 minutes  Greater than 50% was spent in counseling and coordination of care with the patient    Signed, Esmond Plants, M.D., Ph.D. [place], [place]

## 2020-06-24 NOTE — Telephone Encounter (Signed)
Soni with [facility] advised.   Thanks,   -[name]

## 2020-06-26 ENCOUNTER — Ambulatory Visit (INDEPENDENT_AMBULATORY_CARE_PROVIDER_SITE_OTHER): Payer: PPO | Admitting: Adult Health

## 2020-06-26 ENCOUNTER — Encounter: Payer: Self-pay | Admitting: Adult Health

## 2020-06-26 ENCOUNTER — Other Ambulatory Visit: Payer: Self-pay

## 2020-06-26 ENCOUNTER — Other Ambulatory Visit: Payer: Self-pay | Admitting: Family Medicine

## 2020-06-26 VITALS — BP 117/73 | HR 77 | Resp 16 | Wt 132.8 lb

## 2020-06-26 DIAGNOSIS — L03114 Cellulitis of left upper limb: Secondary | ICD-10-CM | POA: Diagnosis not present

## 2020-06-26 DIAGNOSIS — R600 Localized edema: Secondary | ICD-10-CM

## 2020-06-26 DIAGNOSIS — J449 Chronic obstructive pulmonary disease, unspecified: Secondary | ICD-10-CM

## 2020-06-26 DIAGNOSIS — S6291XS Unspecified fracture of right wrist and hand, sequela: Secondary | ICD-10-CM | POA: Diagnosis not present

## 2020-06-26 DIAGNOSIS — I5032 Chronic diastolic (congestive) heart failure: Secondary | ICD-10-CM

## 2020-06-26 MED ORDER — DOXYCYCLINE HYCLATE 100 MG PO TABS: 100.0000 mg | ORAL_TABLET | Freq: Two times a day (BID) | ORAL | 0 refills | Status: DC

## 2020-06-26 MED ORDER — CEPHALEXIN 500 MG PO CAPS: 500.0000 mg | ORAL_CAPSULE | Freq: Two times a day (BID) | ORAL | 0 refills | Status: DC

## 2020-06-26 NOTE — Patient Instructions (Signed)
Living With Heart Failure  Heart failure is a long-term (chronic) condition in which the heart cannot pump enough blood through the body. When this happens, parts of the body do not get the blood and oxygen they need. There is no cure for heart failure at this time, so it is important for you to take good care of yourself and follow the treatment plan set by your health care provider. If you are living with heart failure, there are ways to help you manage the disease. Follow these instructions at home: Living with heart failure requires you to make changes in your life. Your health care team will teach you about the changes you need to make in order to relieve your symptoms and lower your risk of going to the hospital. Follow the treatment plan as set by your health care provider. Medicines Medicines are important in reducing your heart's workload, slowing the progression of heart failure, and improving your symptoms.  Take over-the-counter and prescription medicines only as told by your health care provider.  Do not stop taking your medicine unless your health care provider tells you to do that.  Do not skip any dose of your medicine.  Refill prescriptions before you run out of medicine. You need your medicines every day. Eating and drinking   Eat heart-healthy foods. Talk with a dietitian to make an eating plan that is right for you. ? If directed by your health care provider:  Limit salt (sodium). Lowering your sodium intake may reduce symptoms of heart failure. Ask a dietitian to recommend heart-healthy seasonings.  Limit your fluid intake. Fluid restriction may reduce symptoms of heart failure. ? Use low-fat cooking methods instead of frying. Low-fat methods include roasting, grilling, broiling, baking, poaching, steaming, and stir-frying. ? Choose foods that contain no trans fat and are low in saturated fat and cholesterol. Healthy choices include fresh or frozen fruits and vegetables,  fish, lean meats, legumes, fat-free or low-fat dairy products, and whole-grain or high-fiber foods.  Limit alcohol intake to no more than 1 drink a day for nonpregnant women and 2 drinks a day for men. One drink equals 12 oz of beer, 5 oz of wine, or 1 oz of hard liquor. ? Drinking more than that is harmful to your heart. Tell your health care provider if you drink alcohol several times a week. ? Talk with your health care provider about whether any level of alcohol use is safe for you. Activity   Ask your health care provider about attending cardiac rehabilitation. These programs include aerobic physical activity, which provides many benefits for your heart.  If no cardiac rehabilitation program is available, ask your health care provider what aerobic exercises are safe for you to do. Lifestyle Make the lifestyle changes recommended by your health care provider. In general:  Lose weight if your health care provider tells you to do that. Weight loss may reduce symptoms of heart failure.  Do not use any products that contain nicotine or tobacco, such as cigarettes or e-cigarettes. If you need help quitting, ask your health care provider.  Do not use street (illegal) drugs.  Return to your normal activities as told by your health care provider. Ask your health care provider what activities are safe for you. General instructions   Make sure you weigh yourself every day to track your weight. Rapid weight gain may indicate an increase in fluid in your body and may increase the workload of your heart. ? Weigh yourself every morning.  Do this after you urinate but before you eat breakfast. ? Wear the same type of clothing, without shoes, each time you weigh yourself. ? Weigh yourself on the same scale and in the same spot each time.  Living with chronic heart failure often leads to emotions such as fear, stress, anxiety, and depression. If you feel any of these emotions and need help coping,  contact your health care provider. Other ways to get help include: ? Talking to friends and family members about your condition. They can give you support and guidance. Explain your symptoms to them and, if comfortable, invite them to attend appointments or rehabilitation with you. ? Joining a support group for people with chronic heart failure. Talking with other people who have the same symptoms may give you new ways of coping with your disease and your emotions.  Stay up to date with your shots (vaccines). Staying current on pneumococcal and influenza vaccines is especially important in preventing germs from attacking your airways (respiratory infections).  Keep all follow-up visits as told by your health care provider. This is important. How to recognize changes in your condition You and your family members need to know what changes to watch for in your condition. Watch for the following changes and report them to your health care provider:  Sudden weight gain. Ask your health care provider what amount of weight gain to report.  Shortness of breath: ? Feeling short of breath while at rest, with no exercise or activity that required great effort. ? Feeling breathless with activity.  Swelling of your lower legs or ankles.  Difficulty sleeping: ? You wake up feeling short of breath. ? You have to use more pillows to raise your head in order to sleep.  Frequent, dry, hacking cough.  Loss of appetite.  Feeling more tired all the time.  Depression or feelings of sadness or hopelessness.  Bloating in the stomach. Where to find more information  Local support groups. Ask your health care provider about groups near you.  The American Heart Association: www.heart.org Contact a health care provider if:  You have a rapid weight gain.  You have increasing shortness of breath that is unusual for you.  You are unable to participate in your usual physical activities.  You tire  easily.  You cough more than normal, especially with physical activity.  You have any swelling or more swelling in areas such as your hands, feet, ankles, or abdomen.  You feel like your heart is beating quickly (palpitations).  You become dizzy or light-headed when you stand up. Get help right away if:  You have difficulty breathing.  You notice or your family notices a change in your awareness, such as having trouble staying awake or having difficulty with concentration.  You have pain or discomfort in your chest.  You have an episode of fainting (syncope). Summary  There is no cure for heart failure, so it is important for you to take good care of yourself and follow the treatment plan set by your health care provider.  Medicines are important in reducing your heart's workload, slowing the progression of heart failure, and improving your symptoms.  Living with chronic heart failure often leads to emotions such as fear, stress, anxiety, and depression. If you are feeling any of these emotions and need help coping, contact your health care provider. This information is not intended to replace advice given to you by your health care provider. Make sure you discuss any questions you  have with your health care provider. Document Revised: 06/18/2017 Document Reviewed: 11/18/2016 Elsevier Patient Education  [place].  Cephalexin Tablets or Capsules What is this medicine? CEPHALEXIN (sef a LEX in) is a cephalosporin antibiotic. It treats some infections caused by bacteria. It will not work for colds, the flu, or other viruses. This medicine may be used for other purposes; ask your health care provider or pharmacist if you have questions. COMMON BRAND NAME(S): Biocef, Daxbia, Keflex, Keftab What should I tell my health care provider before I take this medicine? They need to know if you have any of these conditions:  kidney disease  stomach or intestine problems, especially  colitis  an unusual or allergic reaction to cephalexin, other cephalosporins, penicillins, other antibiotics, medicines, foods, dyes or preservatives  pregnant or trying to get pregnant  breast-feeding How should I use this medicine? Take this drug by mouth. Take it as directed on the prescription label at the same time every day. You can take it with or without food. If it upsets your stomach, take it with food. Take all of this drug unless your health care provider tells you to stop it early. Keep taking it even if you think you are better. Talk to your health care provider about the use of this drug in children. While it may be prescribed for selected conditions, precautions do apply. Overdosage: If you think you have taken too much of this medicine contact a poison control center or emergency room at once. NOTE: This medicine is only for you. Do not share this medicine with others. What if I miss a dose? If you miss a dose, take it as soon as you can. If it is almost time for your next dose, take only that dose. Do not take double or extra doses. What may interact with this medicine?  probenecid  some other antibiotics This list may not describe all possible interactions. Give your health care provider a list of all the medicines, herbs, non-prescription drugs, or dietary supplements you use. Also tell them if you smoke, drink alcohol, or use illegal drugs. Some items may interact with your medicine. What should I watch for while using this medicine? Tell your doctor or health care provider if your symptoms do not begin to improve in a few days. This medicine may cause serious skin reactions. They can happen weeks to months after starting the medicine. Contact your health care provider right away if you notice fevers or flu-like symptoms with a rash. The rash may be red or purple and then turn into blisters or peeling of the skin. Or, you might notice a red rash with swelling of the face,  lips or lymph nodes in your neck or under your arms. Do not treat diarrhea with over the counter products. Contact your doctor if you have diarrhea that lasts more than 2 days or if it is severe and watery. If you have diabetes, you may get a false-positive result for sugar in your urine. Check with your doctor or health care provider. What side effects may I notice from receiving this medicine? Side effects that you should report to your doctor or health care professional as soon as possible:  allergic reactions like skin rash, itching or hives, swelling of the face, lips, or tongue  breathing problems  pain or trouble passing urine  redness, blistering, peeling or loosening of the skin, including inside the mouth  severe or watery diarrhea  unusually weak or tired  yellowing  of the eyes, skin Side effects that usually do not require medical attention (report to your doctor or health care professional if they continue or are bothersome):  gas or heartburn  genital or anal irritation  headache  joint or muscle pain  nausea, vomiting This list may not describe all possible side effects. Call your doctor for medical advice about side effects. You may report side effects to FDA at 1-800-FDA-1088. Where should I keep my medicine? Keep out of the reach of children and pets. Store at room temperature between 20 and 25 degrees C (68 and 77 degrees F). Throw away any unused drug after the expiration date. NOTE: This sheet is a summary. It may not cover all possible information. If you have questions about this medicine, talk to your doctor, pharmacist, or health care provider.  2020 Elsevier/Gold Standard (2019-02-10 11:27:00)

## 2020-06-26 NOTE — Progress Notes (Signed)
Established patient visit   Patient: Mariah [name]   DOB: Oct 10, 1945   74 y.o. Female  MRN: [id number] Visit Date: 06/26/2020  Today's healthcare provider: Marcille Buffy, FNP   Chief Complaint  Patient presents with  . Follow-up   Subjective    HPI  Patient returns back to office today for follow up and to discuss wound care.  Her lower extremities have been wrapped since leaving [facility]. She has not removed bandages.   Her right small finger is still in finger brace and soiled needs to be changed.   She sees orthopedics Friday and has seen cardiology since [facility].   Not wearing her oxygen today.   Patient  denies any fever, body aches,chills, rash, chest pain, shortness of breath, nausea, vomiting, or diarrhea.   Denies dizziness, lightheadedness, pre syncopal or syncopal episodes.   Patient Active Problem List   Diagnosis Date Noted  . Left arm cellulitis 06/26/2020  . Hematoma of right hip 06/18/2020  . Congestive heart failure ([place]) 06/18/2020  . Weakness   . Closed nondisplaced fracture of distal phalanx of right little finger 06/06/2020  . Closed nondisplaced fracture of distal phalanx of right thumb 06/06/2020  . Protein-calorie malnutrition, severe 06/05/2020  . CAP (community acquired pneumonia) 06/03/2020  . Hypoalbuminemia 06/03/2020  . Cutaneous horn 06/03/2020  . Pleural effusion 06/03/2020  . Acute on chronic diastolic CHF (congestive heart failure) ([place]) 06/03/2020  . Acute respiratory failure with hypoxia ([place]) 06/02/2020  . Irregular heartbeat 05/30/2020  . Head injury 05/30/2020  . Bilateral edema of lower extremity 05/30/2020  . Closed fracture of right hand 05/30/2020  . Blood transfusion reaction 05/30/2020  . Symptomatic anemia 05/26/2020  . CKD (chronic kidney disease), stage III ([place]) 05/26/2020  . Hematoma of right thigh 05/26/2020  . Finger fracture, right 05/26/2020  . Dyspnea 05/26/2020  . Acute deep  vein thrombosis (DVT) of tibial vein of left lower extremity ([place]) 04/05/2020  . Mental confusion 04/01/2020  . Unintentional weight loss 03/01/2020  . Fall (on)(from) sidewalk curb, initial encounter 03/01/2020  . Memory loss 03/01/2020  . Rash of back 05/15/2019  . Allergic rhinitis 01/10/2018  . Cholelithiasis 03/16/2016  . Peripheral arterial disease ([place]) 02/05/2016  . 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 Abdominal aortic aneurysm (AAA) ([place]) 01/13/2016  . 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 Nerve root inflammation 10/25/2015  . Psoriatic arthritis ([place]) 10/07/2015  . 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 Right hip pain 08/12/2015  . Arthralgia of multiple joints 08/12/2015  . Age related osteoporosis 08/12/2015  . Chronic obstructive pulmonary disease ([place]) 06/24/2015  . Senile purpura ([place]) 06/24/2015  . Abnormal serum level of alkaline phosphatase 06/18/2015  . Elevated hemoglobin ([place]) 06/18/2015  . Lethargy 06/18/2015  . Fistula 06/18/2015  . Blood glucose elevated 06/18/2015  . Gastro-esophageal reflux disease without esophagitis 06/18/2015  . Restless leg 06/18/2015  . Avitaminosis D 06/18/2015  . Mechanical and motor problems with internal organs 10/11/2009  . Hypercholesteremia 10/11/2009  . Arthritis, degenerative 09/25/2009  . Essential (primary) hypertension 09/25/2009  . Psoriasis 09/25/2009  . Compulsive tobacco user syndrome 09/25/2009   Past Medical History:  Diagnosis Date  . Abdominal aortic aneurysm (AAA) ([place])   . Arthritis   . COPD (chronic obstructive pulmonary disease) ([place])   . GERD (gastroesophageal reflux disease)   . Headache    sinus  . History of adenomatous polyp of colon   . Hyperlipidemia   .  Hypertension   . Psoriasis   . Right lumbar radiculitis   . Vitamin D deficiency        Medications: Outpatient Medications Prior to Visit  Medication Sig  . acetaminophen (TYLENOL) 325 MG tablet Take 2 tablets (650 mg total) by  mouth every 6 (six) hours as needed for mild pain (or Fever >/= 101).  [name] albuterol (VENTOLIN HFA) 108 (90 Base) MCG/ACT inhaler INHALE 1-2 PUFFS INTO THE LUNGS EVERY 6 HOURS AS NEEDED FOR WHEEZING OR SHORTNESS OF BREATH.  [name] apixaban (ELIQUIS) 5 MG TABS tablet Take 1 tablet (5 mg total) by mouth 2 (two) times daily.  [name] ascorbic acid (VITAMIN C) 250 MG tablet Take 1 tablet (250 mg total) by mouth 2 (two) times daily.  [name] aspirin EC 81 MG tablet Take 81 mg by mouth daily. Swallow whole.  . betamethasone dipropionate (DIPROLENE) 0.05 % ointment Apply topically daily.  . calcipotriene (DOVONOX) 0.005 % cream APPLY TO SKIN ONCE A DAY APPLY DAILY ON HANDS, FEET AND WRIST X 2 WEEKS, THEN USE 5 DAYS A WEEK  . Cholecalciferol (VITAMIN D3) 2000 units TABS Take 2,000 Units by mouth daily.  . clobetasol (TEMOVATE) 0.05 % external solution APPLY TO AFFECTED AREA TWICE A DAY  . COSENTYX SENSOREADY, 300 MG, 150 MG/ML SOAJ Inject 2 Syringes into the skin every 28 (twenty-eight) days.  [name] donepezil (ARICEPT) 5 MG tablet Take 5 mg by mouth at bedtime.  . ferrous [id number] C-folic acid (TRINSICON / FOLTRIN) capsule Take 1 capsule by mouth 2 (two) times daily after a meal.  . ferrous sulfate 325 (65 FE) MG tablet Take 325 mg by mouth 2 (two) times daily.  . fluticasone furoate-vilanterol (BREO ELLIPTA) 100-25 MCG/INH AEPB Inhale 1 puff into the lungs daily.  . furosemide (LASIX) 40 MG tablet Take 1 tablet (40 mg total) by mouth daily.  [name] gabapentin (NEURONTIN) 300 MG capsule TAKE 1 CAPSULE BY MOUTH TWICE A DAY  . guaiFENesin (MUCINEX) 600 MG 12 hr tablet Take 1 tablet (600 mg total) by mouth 2 (two) times daily.  [name] levocetirizine (XYZAL) 5 MG tablet TAKE 1 TABLET BY MOUTH EVERY DAY IN THE EVENING  . methotrexate (RHEUMATREX) 2.5 MG tablet Take 15 mg by mouth once a week.   . metoprolol succinate (TOPROL-XL) 50 MG 24 hr tablet TAKE 1 TABLET (50 MG TOTAL) BY MOUTH DAILY. TAKE WITH OR IMMEDIATELY FOLLOWING A  MEAL.  . nicotine (NICODERM CQ - DOSED IN MG/24 HOURS) 21 mg/24hr patch Place 1 patch (21 mg total) onto the skin daily.  . pantoprazole (PROTONIX) 20 MG tablet TAKE 1 TABLET (20 MG TOTAL) BY MOUTH 2 (TWO) TIMES DAILY BEFORE A MEAL.  [name] potassium chloride SA (KLOR-CON) 20 MEQ tablet Take 2 tablets (40 mEq total) by mouth daily.  [name] rOPINIRole (REQUIP) 1 MG tablet TAKE 1 TABLET (1 MG TOTAL) BY MOUTH AT BEDTIME.  . simvastatin (ZOCOR) 20 MG tablet TAKE 1 TABLET BY MOUTH EVERY DAY  . traMADol (ULTRAM) 50 MG tablet TAKE 1 TABLET BY MOUTH EVERY 6 HOURS AS NEEDED FOR MODERATE OR SEVERE PAIN   No facility-administered medications prior to visit.    Review of Systems  Constitutional: Positive for fatigue. Negative for activity change, appetite change, chills, diaphoresis, fever and unexpected weight change.  HENT: Negative.   Respiratory: Positive for shortness of breath (stable she reports not wearing oxygen today. ). Negative for apnea, cough, choking, chest tightness, wheezing and stridor.   Cardiovascular: Positive for leg swelling. Negative  for chest pain and palpitations.  Gastrointestinal: Negative.   Genitourinary: Negative.   Musculoskeletal: Positive for arthralgias.  Neurological: Negative for dizziness, tremors, syncope, weakness, light-headedness, numbness and headaches.  Psychiatric/Behavioral: Positive for agitation.    Last CBC Lab Results  Component Value Date   WBC 6.9 06/18/2020   HGB 11.8 06/18/2020   HCT 35.1 06/18/2020   MCV 94 06/18/2020   MCH 31.5 06/18/2020   RDW 16.1 (H) 06/18/2020   PLT 373 16/04/9603   Last metabolic panel Lab Results  Component Value Date   GLUCOSE 100 (H) 06/18/2020   NA 137 06/18/2020   K 4.6 06/18/2020   CL 97 06/18/2020   CO2 27 06/18/2020   BUN 23 06/18/2020   CREATININE 0.95 06/18/2020   GFRNONAA 59 (L) 06/18/2020   GFRAA 68 06/18/2020   CALCIUM 9.6 06/18/2020   PHOS 4.2 06/03/2020   PROT 6.2 06/18/2020   ALBUMIN 4.2  06/18/2020   LABGLOB 2.0 06/18/2020   AGRATIO 2.1 06/18/2020   BILITOT 0.4 06/18/2020   ALKPHOS 144 (H) 06/18/2020   AST 17 06/18/2020   ALT 30 06/18/2020   ANIONGAP 12 06/06/2020      Objective    BP 117/73   Pulse 77   Resp 16   Wt 132 lb 12.8 oz (60.2 kg)   SpO2 90%   BMI 25.94 kg/m  BP Readings from Last 3 Encounters:  06/26/20 117/73  06/24/20 100/60  06/19/20 120/60   Wt Readings from Last 3 Encounters:  06/26/20 132 lb 12.8 oz (60.2 kg)  06/24/20 133 lb (60.3 kg)  06/19/20 132 lb 9.6 oz (60.1 kg)      Physical Exam Nursing note reviewed.  Constitutional:      General: She is not in acute distress.    Appearance: Normal appearance. She is not ill-appearing, toxic-appearing or diaphoretic.     Comments: Chronically frail.   HENT:     Head: Normocephalic and atraumatic.     Right Ear: External ear normal.     Left Ear: External ear normal.     Nose: Nose normal.     Mouth/Throat:     Mouth: Mucous membranes are moist.     Pharynx: No oropharyngeal exudate or posterior oropharyngeal erythema.  Eyes:     General: No scleral icterus.       Right eye: No discharge.        Left eye: No discharge.     Pupils: Pupils are equal, round, and reactive to light.  Cardiovascular:     Rate and Rhythm: Normal rate and regular rhythm.     Pulses: Normal pulses.     Heart sounds: Normal heart sounds. No murmur heard.  No friction rub. No gallop.   Pulmonary:     Effort: Pulmonary effort is normal. No respiratory distress.     Breath sounds: Normal breath sounds. No stridor. No wheezing, rhonchi or rales.  Chest:     Chest wall: No tenderness.  Abdominal:     General: There is no distension.     Palpations: Abdomen is soft.     Tenderness: There is no abdominal tenderness.  Musculoskeletal:     Right wrist: Normal.     Left wrist: Normal.     Right hand: Bony tenderness (small finger with finger brace. ) present. Decreased range of motion.     Left hand: Normal.  No tenderness.     Cervical back: Normal range of motion and neck supple. No rigidity or  tenderness.     Right lower leg: Edema (improved from last visit. ) present.     Left lower leg: Edema present.  Lymphadenopathy:     Cervical: No cervical adenopathy.  Skin:    General: Skin is dry.     Coloration: Skin is ashen.     Findings: Erythema (left lower arm with scrathes from her previous initial fall, more erythematous today slightly warm. ) present.          Comments: Coban and gauze removed by provider on bilateral lower extremities, swelling has improved, no wounds, no erythema or tenderness. Skin is dry bilaterally.  Pedal pulses bilateral DP/PT 2+ No weeping bilaterally.  No need to redress.   Neurological:     General: No focal deficit present.     Mental Status: Mental status is at baseline.     Motor: No weakness.     Gait: Gait normal.  Psychiatric:        Mood and Affect: Mood normal.        Behavior: Behavior normal.        Thought Content: Thought content normal.        Judgment: Judgment normal.      No results found for any visits on 06/26/20.  Assessment & Plan     Allergies  Allergen Reactions  . Clindamycin/Lincomycin     diarrhea  . Amoxicillin Diarrhea    Chronic obstructive pulmonary disease, unspecified COPD type ([place]) - Plan: Ambulatory referral to Home Health  Bilateral edema of lower extremity - Plan: Ambulatory referral to [facility], Comprehensive Metabolic Panel (CMET)  Chronic diastolic congestive heart failure ([place]) - Plan: Ambulatory referral to [facility]  Closed fracture of right hand, sequela - Plan: DG Hand Complete Right, DG Wrist Complete Right, Ambulatory referral to Home Health  Left arm cellulitis - Plan: doxycycline (VIBRA-TABS) 100 MG tablet, CBC with Differential/Platelet   Meds ordered this encounter  Medications  . doxycycline (VIBRA-TABS) 100 MG tablet    Sig: Take 1 tablet (100 mg total) by mouth 2 (two) times  daily.    Dispense:  20 tablet    Refill:  0  . cephALEXin (KEFLEX) 500 MG capsule    Sig: Take 1 capsule (500 mg total) by mouth 2 (two) times daily.    Dispense:  20 capsule    Refill:  0  She has taken keflex without any difficulty and amoxicillin causes diarrhea only she reports.   Orders Placed This Encounter  Procedures  . DG Hand Complete Right  . DG Wrist Complete Right  . CBC with Differential/Platelet  . Comprehensive Metabolic Panel (CMET)  . Ambulatory referral to [facility] specialty follow ups.  Elevate legs as much as possible, call if any swelling or weeping develops.   Red Flags discussed. The patient was given clear instructions to go to ER or return to medical center if any red flags develop, symptoms do not improve, worsen or new problems develop. They verbalized understanding.  Return in about 1 week (around 07/03/2020), or if symptoms worsen or fail to improve, for at any time for any worsening symptoms, Go to Emergency room/ urgent care if worse.     The entirety of the information documented in the History of Present Illness, Review of Systems and Physical Exam were personally obtained by me. Portions of this information were initially documented by the CMA and reviewed by me for thoroughness and accuracy.     Sharyn Lull  Rolinda Roan, [place] [phone] (phone) [phone] (fax)  [place]

## 2020-06-27 ENCOUNTER — Telehealth: Payer: Self-pay | Admitting: Cardiovascular Disease

## 2020-06-27 ENCOUNTER — Other Ambulatory Visit: Payer: Self-pay | Admitting: Family Medicine

## 2020-06-27 ENCOUNTER — Telehealth: Payer: Self-pay

## 2020-06-27 NOTE — Telephone Encounter (Signed)
The patient was seen by Dr. Rockey Situ on 06/24/20. Weight in clinic was 133 lbs. Goal weight was set at 130 lbs per Dr. Rockey Situ at that visit.  The patient was advised to resume lasix 40 mg once daily on 06/24/20.  Forwarding the patient's reported weights to Dr. Rockey Situ to review.

## 2020-06-27 NOTE — Telephone Encounter (Signed)
Weights from last visit due to starting lasix 12/7 129.4 12/8 129. 12/9 127.6

## 2020-06-27 NOTE — Telephone Encounter (Signed)
Copied from [place] [id number]. Topic: General - Other >> Jun 27, 2020 11:47 AM Oneta Rack wrote: Caller name:  Colletta [place]   Relation to pt: OT from [facility]  Call back number: [phone]    Reason for call:  Requesting OT 1x for 1 week 2x 2 1x 1

## 2020-06-27 NOTE — Telephone Encounter (Signed)
OK for verbals 

## 2020-06-28 DIAGNOSIS — S62666D Nondisplaced fracture of distal phalanx of right little finger, subsequent encounter for fracture with routine healing: Secondary | ICD-10-CM | POA: Diagnosis not present

## 2020-06-28 DIAGNOSIS — Z96641 Presence of right artificial hip joint: Secondary | ICD-10-CM | POA: Diagnosis not present

## 2020-06-28 DIAGNOSIS — S62524D Nondisplaced fracture of distal phalanx of right thumb, subsequent encounter for fracture with routine healing: Secondary | ICD-10-CM | POA: Diagnosis not present

## 2020-06-28 NOTE — Telephone Encounter (Signed)
Called [place] and left detailed message on secure voice message system giving "OK" for requested verbal orders below.

## 2020-06-30 NOTE — Telephone Encounter (Signed)
Continue lasix 40 daily, Change to lasix 20 daily for weight 127 or less

## 2020-07-01 DIAGNOSIS — R410 Disorientation, unspecified: Secondary | ICD-10-CM | POA: Diagnosis not present

## 2020-07-01 DIAGNOSIS — Z86718 Personal history of other venous thrombosis and embolism: Secondary | ICD-10-CM | POA: Diagnosis not present

## 2020-07-01 DIAGNOSIS — R413 Other amnesia: Secondary | ICD-10-CM | POA: Diagnosis not present

## 2020-07-01 MED ORDER — FUROSEMIDE 40 MG PO TABS
40.0000 mg | ORAL_TABLET | Freq: Every day | ORAL | 3 refills | Status: DC
Start: 2020-07-01 — End: 2021-01-13

## 2020-07-01 NOTE — Telephone Encounter (Signed)
Patient's daughter calling back. She verbalized understanding of Dr Donivan Scull recommendations. Med list updated.

## 2020-07-01 NOTE — Addendum Note (Signed)
Addended by: Annia Belt on: 07/01/2020 11:42 AM   Modules accepted: Orders

## 2020-07-01 NOTE — Telephone Encounter (Signed)
No answer. Left message to call back.   

## 2020-07-03 ENCOUNTER — Emergency Department: Payer: PPO

## 2020-07-03 ENCOUNTER — Emergency Department
Admission: EM | Admit: 2020-07-03 | Discharge: 2020-07-03 | Disposition: A | Discharge status: Home / Self Care | Payer: PPO | Attending: Emergency Medicine | Admitting: Emergency Medicine

## 2020-07-03 ENCOUNTER — Telehealth: Payer: Self-pay

## 2020-07-03 ENCOUNTER — Other Ambulatory Visit: Payer: Self-pay

## 2020-07-03 DIAGNOSIS — I82442 Acute embolism and thrombosis of left tibial vein: Secondary | ICD-10-CM | POA: Diagnosis not present

## 2020-07-03 DIAGNOSIS — F17200 Nicotine dependence, unspecified, uncomplicated: Secondary | ICD-10-CM | POA: Diagnosis not present

## 2020-07-03 DIAGNOSIS — N183 Chronic kidney disease, stage 3 unspecified: Secondary | ICD-10-CM | POA: Insufficient documentation

## 2020-07-03 DIAGNOSIS — J439 Emphysema, unspecified: Secondary | ICD-10-CM | POA: Diagnosis not present

## 2020-07-03 DIAGNOSIS — I714 Abdominal aortic aneurysm, without rupture: Secondary | ICD-10-CM | POA: Diagnosis not present

## 2020-07-03 DIAGNOSIS — R0902 Hypoxemia: Secondary | ICD-10-CM | POA: Diagnosis not present

## 2020-07-03 DIAGNOSIS — G2581 Restless legs syndrome: Secondary | ICD-10-CM | POA: Diagnosis not present

## 2020-07-03 DIAGNOSIS — I5042 Chronic combined systolic (congestive) and diastolic (congestive) heart failure: Secondary | ICD-10-CM | POA: Diagnosis not present

## 2020-07-03 DIAGNOSIS — I130 Hypertensive heart and chronic kidney disease with heart failure and stage 1 through stage 4 chronic kidney disease, or unspecified chronic kidney disease: Secondary | ICD-10-CM | POA: Insufficient documentation

## 2020-07-03 DIAGNOSIS — I5033 Acute on chronic diastolic (congestive) heart failure: Secondary | ICD-10-CM | POA: Insufficient documentation

## 2020-07-03 DIAGNOSIS — Z7901 Long term (current) use of anticoagulants: Secondary | ICD-10-CM | POA: Insufficient documentation

## 2020-07-03 DIAGNOSIS — Z7952 Long term (current) use of systemic steroids: Secondary | ICD-10-CM | POA: Diagnosis not present

## 2020-07-03 DIAGNOSIS — Z7951 Long term (current) use of inhaled steroids: Secondary | ICD-10-CM | POA: Diagnosis not present

## 2020-07-03 DIAGNOSIS — F17210 Nicotine dependence, cigarettes, uncomplicated: Secondary | ICD-10-CM | POA: Diagnosis not present

## 2020-07-03 DIAGNOSIS — S7011XD Contusion of right thigh, subsequent encounter: Secondary | ICD-10-CM | POA: Diagnosis not present

## 2020-07-03 DIAGNOSIS — Z96643 Presence of artificial hip joint, bilateral: Secondary | ICD-10-CM | POA: Insufficient documentation

## 2020-07-03 DIAGNOSIS — S62636D Displaced fracture of distal phalanx of right little finger, subsequent encounter for fracture with routine healing: Secondary | ICD-10-CM | POA: Diagnosis not present

## 2020-07-03 DIAGNOSIS — D631 Anemia in chronic kidney disease: Secondary | ICD-10-CM | POA: Diagnosis not present

## 2020-07-03 DIAGNOSIS — M6281 Muscle weakness (generalized): Secondary | ICD-10-CM | POA: Diagnosis not present

## 2020-07-03 DIAGNOSIS — J441 Chronic obstructive pulmonary disease with (acute) exacerbation: Secondary | ICD-10-CM | POA: Diagnosis not present

## 2020-07-03 DIAGNOSIS — M1990 Unspecified osteoarthritis, unspecified site: Secondary | ICD-10-CM | POA: Diagnosis not present

## 2020-07-03 DIAGNOSIS — M5416 Radiculopathy, lumbar region: Secondary | ICD-10-CM | POA: Diagnosis not present

## 2020-07-03 DIAGNOSIS — I517 Cardiomegaly: Secondary | ICD-10-CM | POA: Diagnosis not present

## 2020-07-03 DIAGNOSIS — J9611 Chronic respiratory failure with hypoxia: Secondary | ICD-10-CM | POA: Diagnosis not present

## 2020-07-03 DIAGNOSIS — K219 Gastro-esophageal reflux disease without esophagitis: Secondary | ICD-10-CM | POA: Diagnosis not present

## 2020-07-03 DIAGNOSIS — Z79899 Other long term (current) drug therapy: Secondary | ICD-10-CM | POA: Insufficient documentation

## 2020-07-03 DIAGNOSIS — F0150 Vascular dementia without behavioral disturbance: Secondary | ICD-10-CM | POA: Diagnosis not present

## 2020-07-03 DIAGNOSIS — J90 Pleural effusion, not elsewhere classified: Secondary | ICD-10-CM | POA: Diagnosis not present

## 2020-07-03 DIAGNOSIS — L409 Psoriasis, unspecified: Secondary | ICD-10-CM | POA: Diagnosis not present

## 2020-07-03 DIAGNOSIS — E43 Unspecified severe protein-calorie malnutrition: Secondary | ICD-10-CM | POA: Diagnosis not present

## 2020-07-03 DIAGNOSIS — E559 Vitamin D deficiency, unspecified: Secondary | ICD-10-CM | POA: Diagnosis not present

## 2020-07-03 DIAGNOSIS — R000 Tachycardia, unspecified: Secondary | ICD-10-CM | POA: Diagnosis not present

## 2020-07-03 DIAGNOSIS — E785 Hyperlipidemia, unspecified: Secondary | ICD-10-CM | POA: Diagnosis not present

## 2020-07-03 DIAGNOSIS — Z7982 Long term (current) use of aspirin: Secondary | ICD-10-CM | POA: Diagnosis not present

## 2020-07-03 DIAGNOSIS — J449 Chronic obstructive pulmonary disease, unspecified: Secondary | ICD-10-CM | POA: Diagnosis not present

## 2020-07-03 LAB — CBC
HCT: 33.4 % — ABNORMAL LOW (ref 36.0–46.0)
Hemoglobin: 11 g/dL — ABNORMAL LOW (ref 12.0–15.0)
MCH: 32 pg (ref 26.0–34.0)
MCHC: 32.9 g/dL (ref 30.0–36.0)
MCV: 97.1 fL (ref 80.0–100.0)
Platelets: 128 10*3/uL — ABNORMAL LOW (ref 150–400)
RBC: 3.44 MIL/uL — ABNORMAL LOW (ref 3.87–5.11)
RDW: 17.6 % — ABNORMAL HIGH (ref 11.5–15.5)
WBC: 3.2 10*3/uL — ABNORMAL LOW (ref 4.0–10.5)
nRBC: 0 % (ref 0.0–0.2)

## 2020-07-03 LAB — BASIC METABOLIC PANEL
Anion gap: 11 (ref 5–15)
BUN: 19 mg/dL (ref 8–23)
CO2: 30 mmol/L (ref 22–32)
Calcium: 8.5 mg/dL — ABNORMAL LOW (ref 8.9–10.3)
Chloride: 97 mmol/L — ABNORMAL LOW (ref 98–111)
Creatinine, Ser: 1.08 mg/dL — ABNORMAL HIGH (ref 0.44–1.00)
GFR, Estimated: 54 mL/min — ABNORMAL LOW (ref >60)
Glucose, Bld: 96 mg/dL (ref 70–99)
Potassium: 3.9 mmol/L (ref 3.5–5.1)
Sodium: 138 mmol/L (ref 135–145)

## 2020-07-03 LAB — TROPONIN I (HIGH SENSITIVITY)
Troponin I (High Sensitivity): 6 ng/L (ref <18)
Troponin I (High Sensitivity): 6 ng/L (ref <18)

## 2020-07-03 LAB — BRAIN NATRIURETIC PEPTIDE: B Natriuretic Peptide: 214.4 pg/mL — ABNORMAL HIGH (ref 0.0–100.0)

## 2020-07-03 MED ORDER — ALBUTEROL SULFATE HFA 108 (90 BASE) MCG/ACT IN AERS: 2.0000 | INHALATION_SPRAY | Freq: Four times a day (QID) | RESPIRATORY_TRACT | 2 refills | Status: DC | PRN

## 2020-07-03 MED ORDER — PREDNISONE 20 MG PO TABS
20.0000 mg | ORAL_TABLET | Freq: Once | ORAL | Status: AC
  Administered 2020-07-03: 20 mg via ORAL
  Filled 2020-07-03: qty 1

## 2020-07-03 MED ORDER — IPRATROPIUM-ALBUTEROL 0.5-2.5 (3) MG/3ML IN SOLN
3.0000 mL | Freq: Once | RESPIRATORY_TRACT | Status: AC
  Administered 2020-07-03: 3 mL via RESPIRATORY_TRACT

## 2020-07-03 MED ORDER — PREDNISONE 20 MG PO TABS: 20.0000 mg | ORAL_TABLET | Freq: Every day | ORAL | 0 refills | Status: DC

## 2020-07-03 MED ORDER — IPRATROPIUM-ALBUTEROL 0.5-2.5 (3) MG/3ML IN SOLN
RESPIRATORY_TRACT | Status: AC
  Administered 2020-07-03: 3 mL via RESPIRATORY_TRACT
  Filled 2020-07-03: qty 3

## 2020-07-03 MED ORDER — AZITHROMYCIN 500 MG PO TABS
500.0000 mg | ORAL_TABLET | Freq: Once | ORAL | Status: AC
  Administered 2020-07-03: 500 mg via ORAL
  Filled 2020-07-03: qty 1

## 2020-07-03 MED ORDER — IPRATROPIUM-ALBUTEROL 0.5-2.5 (3) MG/3ML IN SOLN
3.0000 mL | Freq: Once | RESPIRATORY_TRACT | Status: AC
  Filled 2020-07-03: qty 3

## 2020-07-03 MED ORDER — AZITHROMYCIN 250 MG PO TABS: ORAL_TABLET | ORAL | 0 refills | Status: AC

## 2020-07-03 MED ORDER — IPRATROPIUM-ALBUTEROL 0.5-2.5 (3) MG/3ML IN SOLN
3.0000 mL | Freq: Once | RESPIRATORY_TRACT | Status: AC
  Administered 2020-07-03: 3 mL via RESPIRATORY_TRACT
  Filled 2020-07-03: qty 3

## 2020-07-03 NOTE — ED Notes (Signed)
Pt eating dinner tray. Pt alert and talking on cell phone

## 2020-07-03 NOTE — Discharge Instructions (Addendum)
It looks like you are having a COPD exacerbation.  Your oxygen level went up nicely into the mid 90s with DuoNeb treatments.  I will let you go home.  Please return if you get more short of breath develop a fever bad cough or feel sicker.  Use the inhaler 2 puffs 4 times a day as needed.  You can use it up to every 4 hours but if you use it every 4 hours 2 or 3 times you probably already come in for recheck.  Take the prednisone once a day starting tomorrow.  I gave you a dose here in the emergency room.  Take the Zithromax as directed start that tomorrow as well as I gave you a dose also in the emergency room.

## 2020-07-03 NOTE — ED Triage Notes (Signed)
Pt was seen by her occupational therapisy and O2 sats were in the 56s. Pt HCP decided to have pt come to the hospital to be seen for low O2 sat. Pt O2 sat upon arrival was 78 on room air. Pt placed on 2L and current 02 sat is 99. Pt has no complaint of pain.

## 2020-07-03 NOTE — Telephone Encounter (Signed)
Agree with ER evaluation due to COPD, h/o DVT and PAD.

## 2020-07-03 NOTE — Telephone Encounter (Signed)
Ms. Creppel's home health nurse called to report that pt's 02 is at 70-75% room air.  She put the pt on 3 liters of 02 and is only able to get her level to 85%.    Ms. Rosaria Ferries denies any symptoms such as shortness of breath, dizziness.  She does not seem distressed and is talking in complete sentences without trouble.  The nurse states they have used two different 02 meters with the same results.  After reviewing her past medical history Fenton Malling PA-C recommends that Ms Crist would need to be urgently evaluated at the ED.  Pt agreed to go but stated her portable 02 machine was not working correctly.  I advised her and her nurse it would be better to call rescue so they can keep oxygen on her.  Both the patient and nurse agreed and will call rescue.    Thanks,   -[name]

## 2020-07-03 NOTE — ED Provider Notes (Signed)
[facility] Emergency Department Provider Note   ____________________________________________   Event Date/Time   First MD Initiated Contact with Patient 07/03/20 1648     (approximate)  I have reviewed the triage vital signs and the nursing notes.   HISTORY  Chief Complaint No chief complaint on file. Chief complaint is low O2 sat  HPI Mariah [name] is a 74 y.o. female patient seen by home health nurse today and found to have an O2 sat in the 51s.  Patient was sent to the emergency room.  Patient got a DuoNeb here.  Placed on 2 L and her sats are 99 on 2 L.  She was feeling well otherwise.  Possibly a little short of breath but not badly.  She has a history of COPD.  She does not have any chest pain or any other complaints.         Past Medical History:  Diagnosis Date  . Abdominal aortic aneurysm (AAA) ([place])   . Arthritis   . COPD (chronic obstructive pulmonary disease) ([place])   . GERD (gastroesophageal reflux disease)   . Headache    sinus  . History of adenomatous polyp of colon   . Hyperlipidemia   . Hypertension   . Psoriasis   . Right lumbar radiculitis   . Vitamin D deficiency     Patient Active Problem List   Diagnosis Date Noted  . Left arm cellulitis 06/26/2020  . Hematoma of right hip 06/18/2020  . Congestive heart failure ([place]) 06/18/2020  . Weakness   . Closed nondisplaced fracture of distal phalanx of right little finger 06/06/2020  . Closed nondisplaced fracture of distal phalanx of right thumb 06/06/2020  . Protein-calorie malnutrition, severe 06/05/2020  . CAP (community acquired pneumonia) 06/03/2020  . Hypoalbuminemia 06/03/2020  . Cutaneous horn 06/03/2020  . Pleural effusion 06/03/2020  . Acute on chronic diastolic CHF (congestive heart failure) ([place]) 06/03/2020  . Acute respiratory failure with hypoxia ([place]) 06/02/2020  . Irregular heartbeat 05/30/2020  . Head injury 05/30/2020  . Bilateral edema of lower  extremity 05/30/2020  . Closed fracture of right hand 05/30/2020  . Blood transfusion reaction 05/30/2020  . Symptomatic anemia 05/26/2020  . CKD (chronic kidney disease), stage III ([place]) 05/26/2020  . Hematoma of right thigh 05/26/2020  . Finger fracture, right 05/26/2020  . Dyspnea 05/26/2020  . Acute deep vein thrombosis (DVT) of tibial vein of left lower extremity ([place]) 04/05/2020  . Mental confusion 04/01/2020  . Unintentional weight loss 03/01/2020  . Fall (on)(from) sidewalk curb, initial encounter 03/01/2020  . Memory loss 03/01/2020  . Rash of back 05/15/2019  . Allergic rhinitis 01/10/2018  . Cholelithiasis 03/16/2016  . Peripheral arterial disease ([place]) 02/05/2016  . 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 Abdominal aortic aneurysm (AAA) ([place]) 01/13/2016  . 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 Nerve root inflammation 10/25/2015  . 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 Right hip pain 08/12/2015  . Arthralgia of multiple joints 08/12/2015  . Age related osteoporosis 08/12/2015  . Chronic obstructive pulmonary disease ([place]) 06/24/2015  . Senile purpura ([place]) 06/24/2015  . Abnormal serum level of alkaline phosphatase 06/18/2015  . Elevated hemoglobin ([place]) 06/18/2015  . Lethargy 06/18/2015  . Fistula 06/18/2015  . Blood glucose elevated 06/18/2015  . Gastro-esophageal reflux disease without esophagitis 06/18/2015  . Restless leg 06/18/2015  . Avitaminosis D 06/18/2015  . Mechanical and motor problems with internal organs 10/11/2009  . Hypercholesteremia  10/11/2009  . Arthritis, degenerative 09/25/2009  . Essential (primary) hypertension 09/25/2009  . Psoriasis 09/25/2009  . Compulsive tobacco user syndrome 09/25/2009    Past Surgical History:  Procedure Laterality Date  . APPENDECTOMY  2000  . BREAST BIOPSY Right 09/2016   radial scar;COMPLEX SCLEROSING LESION  . BREAST LUMPECTOMY Right 11/24/2016   COMPLEX SCLEROSING  LESION  . BREAST LUMPECTOMY WITH NEEDLE LOCALIZATION Right 11/24/2016   Procedure: BREAST LUMPECTOMY WITH NEEDLE LOCALIZATION;  Surgeon: Leonie Green, MD;  Location: [facility] ORS;  Service: General;  Laterality: Right;  . COLONOSCOPY WITH PROPOFOL N/A 08/10/2016   Procedure: COLONOSCOPY WITH PROPOFOL;  Surgeon: Manya Silvas, MD;  Location: [facility] ENDOSCOPY;  Service: Endoscopy;  Laterality: N/A;  . ESOPHAGOGASTRODUODENOSCOPY (EGD) WITH PROPOFOL N/A 08/10/2016   Procedure: ESOPHAGOGASTRODUODENOSCOPY (EGD) WITH PROPOFOL;  Surgeon: Manya Silvas, MD;  Location: [facility] ENDOSCOPY;  Service: Endoscopy;  Laterality: N/A;  . FOOT SURGERY Bilateral   . JOINT REPLACEMENT Right 01/27/2016   hip  . 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 TOTAL HIP ARTHROPLASTY Right 01/27/2016   Procedure: TOTAL HIP ARTHROPLASTY;  Surgeon: Dereck Leep, MD;  Location: [facility] ORS;  Service: Orthopedics;  Laterality: Right;    Prior to Admission medications   Medication Sig Start Date End Date Taking? Authorizing Provider  acetaminophen (TYLENOL) 325 MG tablet Take 2 tablets (650 mg total) by mouth every 6 (six) hours as needed for mild pain (or Fever >/= 101). 06/06/20   Nita Sells, MD  albuterol (VENTOLIN HFA) 108 (90 Base) MCG/ACT inhaler INHALE 1-2 PUFFS INTO THE LUNGS EVERY 6 HOURS AS NEEDED FOR WHEEZING OR SHORTNESS OF BREATH. 05/22/20   Trinna Post, PA-C  albuterol (VENTOLIN HFA) 108 (90 Base) MCG/ACT inhaler Inhale 2 puffs into the lungs every 6 (six) hours as needed for wheezing or shortness of breath. 07/03/20   Nena Polio, MD  apixaban (ELIQUIS) 5 MG TABS tablet Take 1 tablet (5 mg total) by mouth 2 (two) times daily. 06/09/20   Nita Sells, MD  ascorbic acid (VITAMIN C) 250 MG tablet Take 1 tablet (250 mg total) by mouth 2 (two) times daily. 06/06/20   Nita Sells, MD  aspirin EC 81 MG tablet Take 81 mg by mouth daily. Swallow whole.    [provider]  azithromycin  (ZITHROMAX Z-PAK) 250 MG tablet Take 2 tablets (500 mg) on  Day 1,  followed by 1 tablet (250 mg) once daily on Days 2 through 5. 07/03/20 07/08/20  Nena Polio, MD  betamethasone dipropionate (DIPROLENE) 0.05 % ointment Apply topically daily. 01/01/20   Ralene Bathe, MD  calcipotriene (DOVONOX) 0.005 % cream APPLY TO SKIN ONCE A DAY APPLY DAILY ON HANDS, FEET AND WRIST X 2 WEEKS, THEN USE 5 DAYS A WEEK 01/01/20   Ralene Bathe, MD  cephALEXin (KEFLEX) 500 MG capsule Take 1 capsule (500 mg total) by mouth 2 (two) times daily. 06/26/20   Flinchum, Kelby Aline, FNP  Cholecalciferol (VITAMIN D3) 2000 units TABS Take 2,000 Units by mouth daily.    [provider]  clobetasol (TEMOVATE) 0.05 % external solution APPLY TO AFFECTED AREA TWICE A DAY 12/03/19   Bacigalupo, Dionne Bucy, MD  COSENTYX SENSOREADY, 300 MG, 150 MG/ML SOAJ Inject 2 Syringes into the skin every 28 (twenty-eight) days. 05/31/20   [provider]  donepezil (ARICEPT) 5 MG tablet Take 5 mg by mouth at bedtime.    [provider]  doxycycline (VIBRA-TABS) 100 MG  tablet Take 1 tablet (100 mg total) by mouth 2 (two) times daily. 06/26/20   Flinchum, Kelby Aline, FNP  ferrous [id number] C-folic acid (TRINSICON / FOLTRIN) capsule Take 1 capsule by mouth 2 (two) times daily after a meal. 05/28/20   Lorella Nimrod, MD  ferrous sulfate 325 (65 FE) MG tablet Take 325 mg by mouth 2 (two) times daily. 05/28/20   [provider]  fluticasone furoate-vilanterol (BREO ELLIPTA) 100-25 MCG/INH AEPB Inhale 1 puff into the lungs daily. 05/22/20   Trinna Post, PA-C  furosemide (LASIX) 40 MG tablet Take 1 tablet (40 mg total) by mouth daily. If weight 127 lb or less, take 20 mg by mouth daily. 07/01/20   Minna Merritts, MD  gabapentin (NEURONTIN) 300 MG capsule TAKE 1 CAPSULE BY MOUTH TWICE A DAY 05/07/20   Bacigalupo, Dionne Bucy, MD  guaiFENesin (MUCINEX) 600 MG 12 hr tablet Take 1 tablet (600 mg total) by  mouth 2 (two) times daily. 06/06/20   Nita Sells, MD  levocetirizine (XYZAL) 5 MG tablet TAKE 1 TABLET BY MOUTH EVERY DAY IN THE EVENING 12/03/19   Bacigalupo, Dionne Bucy, MD  methotrexate (RHEUMATREX) 2.5 MG tablet Take 15 mg by mouth once a week.  05/23/20   [provider]  metoprolol succinate (TOPROL-XL) 50 MG 24 hr tablet TAKE 1 TABLET (50 MG TOTAL) BY MOUTH DAILY. TAKE WITH OR IMMEDIATELY FOLLOWING A MEAL. 06/27/20   Bacigalupo, Dionne Bucy, MD  nicotine (NICODERM CQ - DOSED IN MG/24 HOURS) 21 mg/24hr patch Place 1 patch (21 mg total) onto the skin daily. 06/24/20   Minna Merritts, MD  pantoprazole (PROTONIX) 20 MG tablet TAKE 1 TABLET (20 MG TOTAL) BY MOUTH 2 (TWO) TIMES DAILY BEFORE A MEAL. 06/26/20   Bacigalupo, Dionne Bucy, MD  potassium chloride SA (KLOR-CON) 20 MEQ tablet Take 2 tablets (40 mEq total) by mouth daily. 06/06/20   Nita Sells, MD  predniSONE (DELTASONE) 20 MG tablet Take 1 tablet (20 mg total) by mouth daily. 07/03/20 07/03/21  Nena Polio, MD  rOPINIRole (REQUIP) 1 MG tablet TAKE 1 TABLET (1 MG TOTAL) BY MOUTH AT BEDTIME. 06/26/20   Bacigalupo, Dionne Bucy, MD  simvastatin (ZOCOR) 20 MG tablet TAKE 1 TABLET BY MOUTH EVERY DAY 10/01/19   Bacigalupo, Dionne Bucy, MD  traMADol (ULTRAM) 50 MG tablet TAKE 1 TABLET BY MOUTH EVERY 6 HOURS AS NEEDED FOR MODERATE OR SEVERE PAIN 06/06/20   Nita Sells, MD    Allergies Clindamycin/lincomycin and Amoxicillin  Family History  Problem Relation Age of Onset  . Cancer Mother        kidney  . Arthritis Father   . Heart disease Father   . COPD Sister   . Fibromyalgia Sister   . Osteoarthritis Sister   . Osteoarthritis Maternal Aunt   . Colon cancer Paternal Aunt   . Colon cancer Paternal Uncle   . Colon cancer Paternal Grandmother   . Breast cancer Neg Hx     Social History Social History   Tobacco Use  . Smoking status: Current Every Day Smoker    Packs/day: 1.50    Years: 40.00    Pack years:  60.00  . Smokeless tobacco: Never Used  . Tobacco comment: 0.5 PPD 06/19/2020  Vaping Use  . Vaping Use: Some days  Substance Use Topics  . Alcohol use: Yes    Comment: occasionally glass of wine  . Drug use: No    Review of Systems  Constitutional: No fever/chills  Eyes: No visual changes. ENT: No sore throat. Cardiovascular: Denies chest pain. Respiratory: Possibly slight shortness of breath. Gastrointestinal: No abdominal pain.  No nausea, no vomiting.  No diarrhea.  No constipation. Genitourinary: Negative for dysuria. Musculoskeletal: Negative for back pain. Skin: Negative for rash. Neurological: Negative for headaches, focal weakness  ____________________________________________   PHYSICAL EXAM:  VITAL SIGNS: ED Triage Vitals  Enc Vitals Group     BP 07/03/20 1657 114/70     Pulse Rate 07/03/20 1657 86     Resp 07/03/20 1657 18     Temp 07/03/20 1657 98.3 F (36.8 C)     Temp Source 07/03/20 1657 Oral     SpO2 07/03/20 1657 99 %     Weight 07/03/20 1658 120 lb (54.4 kg)     Height 07/03/20 1658 5\' 1"  (1.549 m)     Head Circumference --      Peak Flow --      Pain Score 07/03/20 1658 0     Pain Loc --      Pain Edu? --      Excl. in [place]? --     Constitutional: Alert and oriented. Well appearing and in no acute distress. Eyes: Conjunctivae are normal.  Head: Atraumatic. Nose: No congestion/rhinnorhea. Mouth/Throat: Mucous membranes are moist.  Oropharynx non-erythematous. Neck: No stridor.   Cardiovascular: Normal rate, regular rhythm. Grossly normal heart sounds.  Good peripheral circulation. Respiratory: Normal respiratory effort.  No retractions. Lungs rhonchi throughout Gastrointestinal: Soft and nontender. No distention. No abdominal bruits. No CVA tenderness. Musculoskeletal: No lower extremity tenderness nor edema.  Neurologic:  Normal speech and language. No gross focal neurologic deficits are appreciated.  Skin:  Skin is warm, dry and intact. No  rash noted.  ____________________________________________   LABS (all labs ordered are listed, but only abnormal results are displayed)  Labs Reviewed  BASIC METABOLIC PANEL - Abnormal; Notable for the following components:      Result Value   Chloride 97 (*)    Creatinine, Ser 1.08 (*)    Calcium 8.5 (*)    GFR, Estimated 54 (*)    All other components within normal limits  CBC - Abnormal; Notable for the following components:   WBC 3.2 (*)    RBC 3.44 (*)    Hemoglobin 11.0 (*)    HCT 33.4 (*)    RDW 17.6 (*)    Platelets 128 (*)    All other components within normal limits  BRAIN NATRIURETIC PEPTIDE - Abnormal; Notable for the following components:   B Natriuretic Peptide 214.4 (*)    All other components within normal limits  TROPONIN I (HIGH SENSITIVITY)  TROPONIN I (HIGH SENSITIVITY)   ____________________________________________  EKG EKG read interpreted by me shows normal sinus rhythm rate of 75 normal axis no acute ST-T wave changes  ____________________________________________  RADIOLOGY Gertha Calkin, personally viewed and evaluated these images (plain radiographs) as part of my medical decision making, as well as reviewing the written report by the radiologist.  ED MD interpretation: Chest x-ray read by radiology reviewed by me does not show any sign of severe pneumonia or anything else that would explain her hypoxia  Official radiology report(s): DG Chest 2 View  Result Date: 07/03/2020 CLINICAL DATA:  Low oxygen saturation. EXAM: CHEST - 2 VIEW COMPARISON:  Radiograph 06/19/2020. CT 06/02/2020 FINDINGS: Chronic hyperinflation and bronchial thickening. Emphysema with paucity of apical lung markings. Trace pleural effusions are similar to prior exam. Stable cardiomegaly. Unchanged  mediastinal contours. Aortic atherosclerosis. Chronic density at the right lung base. No acute airspace disease. No pneumothorax. Stable osseous structures. IMPRESSION: 1. No  acute findings. 2. Chronic hyperinflation and bronchial thickening consistent with COPD. Trace pleural effusions are similar to prior exam. 3. Chronic cardiomegaly.  Aortic Atherosclerosis (ICD10-I70.0). Electronically Signed   By: Keith Rake M.D.   On: 07/03/2020 19:01    ____________________________________________   PROCEDURES  Procedure(s) performed (including Critical Care):  Procedures   ____________________________________________   INITIAL IMPRESSION / ASSESSMENT AND PLAN / ED COURSE     Patient likely with COPD exacerbation.  She is much better after 2 duo nebs.  Her O2 sats are in the mid 90s now.  She wants to go home.  I believe she can go home.  I will give her one more DuoNeb before she leaves some antibiotics and inhaler to go home with and some prednisone as well.  She will return for any further problems.          ____________________________________________   FINAL CLINICAL IMPRESSION(S) / ED DIAGNOSES  Final diagnoses:  COPD exacerbation [facility])     ED Discharge Orders         Ordered    azithromycin (ZITHROMAX Z-PAK) 250 MG tablet        07/03/20 2105    albuterol (VENTOLIN HFA) 108 (90 Base) MCG/ACT inhaler  Every 6 hours PRN        07/03/20 2105    predniSONE (DELTASONE) 20 MG tablet  Daily        07/03/20 2105          *Please note:  Mariah [name] was evaluated in Emergency Department on 07/03/2020 for the symptoms described in the history of present illness. She was evaluated in the context of the global COVID-19 pandemic, which necessitated consideration that the patient might be at risk for infection with the SARS-CoV-2 virus that causes COVID-19. Institutional protocols and algorithms that pertain to the evaluation of patients at risk for COVID-19 are in a state of rapid change based on information released by regulatory bodies including the CDC and federal and state organizations. These policies and algorithms were followed during the  patient's care in the ED.  Some ED evaluations and interventions may be delayed as a result of limited staffing during and the pandemic.*   Note:  This document was prepared using Dragon voice recognition software and may include unintentional dictation errors.    Nena Polio, MD 07/03/20 2352

## 2020-07-04 ENCOUNTER — Ambulatory Visit: Payer: PPO | Admitting: Physician Assistant

## 2020-07-04 ENCOUNTER — Other Ambulatory Visit: Payer: Self-pay | Admitting: Family Medicine

## 2020-07-04 ENCOUNTER — Telehealth: Payer: Self-pay | Admitting: Family Medicine

## 2020-07-04 DIAGNOSIS — M1990 Unspecified osteoarthritis, unspecified site: Secondary | ICD-10-CM

## 2020-07-04 NOTE — Telephone Encounter (Signed)
Requested medication (s) are due for refill today:yes  Requested medication (s) are on the active medication list: yes  Last refill:  06/06/20  #100 2 refills  Future visit scheduled: yes  Notes to clinic:  Not delegated Last ordered by hospital physician    Requested Prescriptions  Pending Prescriptions Disp Refills   traMADol (ULTRAM) 50 MG tablet [Pharmacy Med Name: TRAMADOL HCL 50 MG TABLET] 100 tablet     Sig: TAKE 1 TABLET BY MOUTH EVERY 6 HOURS AS NEEDED FOR MODERATE OR SEVERE PAIN      Not Delegated - Analgesics:  Opioid Agonists Failed - 07/04/2020  4:03 PM      Failed - This refill cannot be delegated      Failed - Urine Drug Screen completed in last 360 days      Passed - Valid encounter within last 6 months    Recent Outpatient Visits           1 week ago Chronic obstructive pulmonary disease, unspecified COPD type ([place])   [place] Flinchum, Kelby Aline, FNP   2 weeks ago Chronic obstructive pulmonary disease, unspecified COPD type ([place])   [place] Flinchum, Kelby Aline, FNP   1 month ago [place] Flinchum, Kelby Aline, FNP   1 month ago Injury of head, sequela   [employer], Wendee Beavers, PA-C   1 month ago Essential (primary) hypertension   [employer], Dionne Bucy, MD       Future Appointments             In 5 days Bacigalupo, Dionne Bucy, MD [facility], [place]   In 4 weeks Gollan, Kathlene November, MD [facility], [place]   In 1 month Bacigalupo, Dionne Bucy, MD [facility], [place]

## 2020-07-04 NOTE — Telephone Encounter (Signed)
Patient is calling Mariah [name] back Please advise CB- [phone]

## 2020-07-04 NOTE — Telephone Encounter (Signed)
Pt called in said someone called her yesterday and wanted to know why they called, asked pt if she had talked to a nurse yesterday and she wanted to know why she would have talked to a nurse? She just wanted to know if anyone needed her for anything could not imagine why they would..but saw a call on her phone from the office. Told pt I would make sure that office was aware that she was fine. Pt was in ER yesterday.Marland KitchenMarland Kitchen

## 2020-07-04 NOTE — Telephone Encounter (Signed)
Sent to me by mistake I think

## 2020-07-05 NOTE — Telephone Encounter (Signed)
I did not call Ms. Hanser recently.  I did try calling her to see if she needs anything but her voice mail is full.   Thanks,   -[name]

## 2020-07-08 ENCOUNTER — Other Ambulatory Visit: Payer: Self-pay

## 2020-07-08 ENCOUNTER — Ambulatory Visit (INDEPENDENT_AMBULATORY_CARE_PROVIDER_SITE_OTHER): Payer: PPO

## 2020-07-08 ENCOUNTER — Ambulatory Visit (INDEPENDENT_AMBULATORY_CARE_PROVIDER_SITE_OTHER)
Admission: EM | Admit: 2020-07-08 | Discharge: 2020-07-08 | Disposition: A | Discharge status: Home / Self Care | Payer: PPO | Source: Home / Self Care

## 2020-07-08 ENCOUNTER — Telehealth: Payer: Self-pay | Admitting: Family Medicine

## 2020-07-08 ENCOUNTER — Encounter: Payer: Self-pay | Admitting: Emergency Medicine

## 2020-07-08 ENCOUNTER — Encounter: Payer: Self-pay | Admitting: *Deleted

## 2020-07-08 ENCOUNTER — Ambulatory Visit: Payer: PPO | Admitting: Adult Health

## 2020-07-08 ENCOUNTER — Ambulatory Visit: Payer: Self-pay

## 2020-07-08 ENCOUNTER — Telehealth: Payer: Self-pay | Admitting: Adult Health

## 2020-07-08 DIAGNOSIS — Z7951 Long term (current) use of inhaled steroids: Secondary | ICD-10-CM | POA: Insufficient documentation

## 2020-07-08 DIAGNOSIS — E559 Vitamin D deficiency, unspecified: Secondary | ICD-10-CM | POA: Diagnosis not present

## 2020-07-08 DIAGNOSIS — Z8249 Family history of ischemic heart disease and other diseases of the circulatory system: Secondary | ICD-10-CM | POA: Diagnosis not present

## 2020-07-08 DIAGNOSIS — Z880 Allergy status to penicillin: Secondary | ICD-10-CM | POA: Insufficient documentation

## 2020-07-08 DIAGNOSIS — I471 Supraventricular tachycardia: Secondary | ICD-10-CM | POA: Diagnosis not present

## 2020-07-08 DIAGNOSIS — F17200 Nicotine dependence, unspecified, uncomplicated: Secondary | ICD-10-CM | POA: Diagnosis present

## 2020-07-08 DIAGNOSIS — J189 Pneumonia, unspecified organism: Principal | ICD-10-CM | POA: Diagnosis present

## 2020-07-08 DIAGNOSIS — K219 Gastro-esophageal reflux disease without esophagitis: Secondary | ICD-10-CM | POA: Diagnosis present

## 2020-07-08 DIAGNOSIS — F0390 Unspecified dementia without behavioral disturbance: Secondary | ICD-10-CM | POA: Diagnosis present

## 2020-07-08 DIAGNOSIS — Z20822 Contact with and (suspected) exposure to covid-19: Secondary | ICD-10-CM | POA: Diagnosis present

## 2020-07-08 DIAGNOSIS — Z825 Family history of asthma and other chronic lower respiratory diseases: Secondary | ICD-10-CM | POA: Diagnosis not present

## 2020-07-08 DIAGNOSIS — J441 Chronic obstructive pulmonary disease with (acute) exacerbation: Secondary | ICD-10-CM | POA: Diagnosis present

## 2020-07-08 DIAGNOSIS — Z86010 Personal history of colonic polyps: Secondary | ICD-10-CM | POA: Diagnosis not present

## 2020-07-08 DIAGNOSIS — Z8261 Family history of arthritis: Secondary | ICD-10-CM

## 2020-07-08 DIAGNOSIS — Z7982 Long term (current) use of aspirin: Secondary | ICD-10-CM | POA: Insufficient documentation

## 2020-07-08 DIAGNOSIS — J9601 Acute respiratory failure with hypoxia: Secondary | ICD-10-CM | POA: Diagnosis not present

## 2020-07-08 DIAGNOSIS — I509 Heart failure, unspecified: Secondary | ICD-10-CM | POA: Insufficient documentation

## 2020-07-08 DIAGNOSIS — L4050 Arthropathic psoriasis, unspecified: Secondary | ICD-10-CM | POA: Diagnosis not present

## 2020-07-08 DIAGNOSIS — R000 Tachycardia, unspecified: Secondary | ICD-10-CM | POA: Diagnosis not present

## 2020-07-08 DIAGNOSIS — D72819 Decreased white blood cell count, unspecified: Secondary | ICD-10-CM | POA: Diagnosis present

## 2020-07-08 DIAGNOSIS — Z96641 Presence of right artificial hip joint: Secondary | ICD-10-CM | POA: Insufficient documentation

## 2020-07-08 DIAGNOSIS — I5032 Chronic diastolic (congestive) heart failure: Secondary | ICD-10-CM | POA: Diagnosis not present

## 2020-07-08 DIAGNOSIS — I714 Abdominal aortic aneurysm, without rupture: Secondary | ICD-10-CM | POA: Diagnosis present

## 2020-07-08 DIAGNOSIS — I130 Hypertensive heart and chronic kidney disease with heart failure and stage 1 through stage 4 chronic kidney disease, or unspecified chronic kidney disease: Secondary | ICD-10-CM | POA: Insufficient documentation

## 2020-07-08 DIAGNOSIS — R0602 Shortness of breath: Secondary | ICD-10-CM | POA: Insufficient documentation

## 2020-07-08 DIAGNOSIS — Z7952 Long term (current) use of systemic steroids: Secondary | ICD-10-CM

## 2020-07-08 DIAGNOSIS — D61818 Other pancytopenia: Secondary | ICD-10-CM | POA: Diagnosis not present

## 2020-07-08 DIAGNOSIS — F17210 Nicotine dependence, cigarettes, uncomplicated: Secondary | ICD-10-CM | POA: Insufficient documentation

## 2020-07-08 DIAGNOSIS — N182 Chronic kidney disease, stage 2 (mild): Secondary | ICD-10-CM | POA: Diagnosis not present

## 2020-07-08 DIAGNOSIS — Z7901 Long term (current) use of anticoagulants: Secondary | ICD-10-CM | POA: Insufficient documentation

## 2020-07-08 DIAGNOSIS — E785 Hyperlipidemia, unspecified: Secondary | ICD-10-CM | POA: Diagnosis present

## 2020-07-08 DIAGNOSIS — N183 Chronic kidney disease, stage 3 unspecified: Secondary | ICD-10-CM | POA: Insufficient documentation

## 2020-07-08 DIAGNOSIS — Z79899 Other long term (current) drug therapy: Secondary | ICD-10-CM | POA: Insufficient documentation

## 2020-07-08 DIAGNOSIS — Z800 Family history of malignant neoplasm of digestive organs: Secondary | ICD-10-CM

## 2020-07-08 DIAGNOSIS — Z881 Allergy status to other antibiotic agents status: Secondary | ICD-10-CM

## 2020-07-08 DIAGNOSIS — D709 Neutropenia, unspecified: Secondary | ICD-10-CM | POA: Insufficient documentation

## 2020-07-08 DIAGNOSIS — J440 Chronic obstructive pulmonary disease with acute lower respiratory infection: Secondary | ICD-10-CM | POA: Diagnosis not present

## 2020-07-08 DIAGNOSIS — Z86718 Personal history of other venous thrombosis and embolism: Secondary | ICD-10-CM

## 2020-07-08 DIAGNOSIS — R0902 Hypoxemia: Secondary | ICD-10-CM | POA: Diagnosis not present

## 2020-07-08 DIAGNOSIS — I739 Peripheral vascular disease, unspecified: Secondary | ICD-10-CM | POA: Diagnosis not present

## 2020-07-08 DIAGNOSIS — G8929 Other chronic pain: Secondary | ICD-10-CM | POA: Diagnosis present

## 2020-07-08 DIAGNOSIS — R069 Unspecified abnormalities of breathing: Secondary | ICD-10-CM | POA: Diagnosis not present

## 2020-07-08 LAB — CBC WITH DIFFERENTIAL/PLATELET
Abs Immature Granulocytes: 0.01 10*3/uL (ref 0.00–0.07)
Basophils Absolute: 0 10*3/uL (ref 0.0–0.1)
Basophils Relative: 0 %
Eosinophils Absolute: 0 10*3/uL (ref 0.0–0.5)
Eosinophils Relative: 1 %
HCT: 29.5 % — ABNORMAL LOW (ref 36.0–46.0)
Hemoglobin: 10.2 g/dL — ABNORMAL LOW (ref 12.0–15.0)
Immature Granulocytes: 1 %
Lymphocytes Relative: 24 %
Lymphs Abs: 0.5 10*3/uL — ABNORMAL LOW (ref 0.7–4.0)
MCH: 32.2 pg (ref 26.0–34.0)
MCHC: 34.6 g/dL (ref 30.0–36.0)
MCV: 93.1 fL (ref 80.0–100.0)
Monocytes Absolute: 0.1 10*3/uL (ref 0.1–1.0)
Monocytes Relative: 7 %
Neutro Abs: 1.4 10*3/uL — ABNORMAL LOW (ref 1.7–7.7)
Neutrophils Relative %: 67 %
Platelets: 112 10*3/uL — ABNORMAL LOW (ref 150–400)
RBC: 3.17 MIL/uL — ABNORMAL LOW (ref 3.87–5.11)
RDW: 17.4 % — ABNORMAL HIGH (ref 11.5–15.5)
WBC: 2 10*3/uL — ABNORMAL LOW (ref 4.0–10.5)
nRBC: 0 % (ref 0.0–0.2)

## 2020-07-08 LAB — COMPREHENSIVE METABOLIC PANEL
ALT: 14 U/L (ref 0–44)
AST: 18 U/L (ref 15–41)
Albumin: 3.3 g/dL — ABNORMAL LOW (ref 3.5–5.0)
Alkaline Phosphatase: 95 U/L (ref 38–126)
Anion gap: 9 (ref 5–15)
BUN: 16 mg/dL (ref 8–23)
CO2: 29 mmol/L (ref 22–32)
Calcium: 8.5 mg/dL — ABNORMAL LOW (ref 8.9–10.3)
Chloride: 97 mmol/L — ABNORMAL LOW (ref 98–111)
Creatinine, Ser: 0.91 mg/dL (ref 0.44–1.00)
GFR, Estimated: >60 mL/min (ref >60)
Glucose, Bld: 116 mg/dL — ABNORMAL HIGH (ref 70–99)
Potassium: 3.5 mmol/L (ref 3.5–5.1)
Sodium: 135 mmol/L (ref 135–145)
Total Bilirubin: 0.7 mg/dL (ref 0.3–1.2)
Total Protein: 6.2 g/dL — ABNORMAL LOW (ref 6.5–8.1)

## 2020-07-08 LAB — RESP PANEL BY RT-PCR (FLU A&B, COVID) ARPGX2
Influenza A by PCR: NEGATIVE
Influenza B by PCR: NEGATIVE
SARS Coronavirus 2 by RT PCR: NEGATIVE

## 2020-07-08 NOTE — Telephone Encounter (Signed)
Patient has respiratory distress and should be advised of Emergency room immediately, not to come to office. PEC was sent follow up message as well.  Advised ER now.

## 2020-07-08 NOTE — Telephone Encounter (Signed)
No office visit is not ok, patient has symptoms of respiratory distress and respiratory symptom unable to be seen in the office ER NOW should have been advised.

## 2020-07-08 NOTE — Telephone Encounter (Signed)
Oops, I guess disregard the other message. Other provider recommended ER eval.

## 2020-07-08 NOTE — Telephone Encounter (Signed)
FYI see  below as well as Kathleen's triage post this call.

## 2020-07-08 NOTE — ED Triage Notes (Addendum)
Pt lives with her niece, says her family thought she was short of breath today, more than normal. Says she only wears oxygen when she needs it and when she sleeps. Recently seen for COPD exacerbation. Currently denies SOB, pain, or cough. NAD noted in triage. Pt was seen today at UC, blood work, cxr and covid testing noted in chart.

## 2020-07-08 NOTE — ED Triage Notes (Signed)
Patient was seen in the ER on 12/15 for a COPD exacerbation. Patient was given Prednisone, Azithromycin, and Albuterol. She was discharged from the ER. Niece states patient has been more lethargic, decreased appetite and today c/o shortness of breath. EMS called and they stated EMS refused to take her to the ER.

## 2020-07-08 NOTE — ED Notes (Signed)
Patient is being discharged from the Urgent Care and sent to the Emergency Department via EMS. Per Laurene Footman, PA, patient is in need of higher level of care due to SOB, COPD exacerbation, pneumonia, decreased WBC. Patient is aware and verbalizes understanding of plan of care.  Vitals:   07/08/20 1531 07/08/20 1630  BP: 118/73   Pulse: (!) 101   Resp: 20   Temp: 98.4 F (36.9 C)   SpO2: 93% 93%

## 2020-07-08 NOTE — ED Provider Notes (Signed)
[facility]    CSN: [id number] Arrival date & time: 07/08/20  1457      History   Chief Complaint Chief Complaint  Patient presents with  . Shortness of Breath         HPI Mariah [name] is a 74 y.o. female with history of COPD, AAA, hypertension, hyperlipidemia, CHF, CKD stage III, dementia. Patient presents with her niece who is helping to provide some of the history.   They present today for concerns about decreased appetite, increased fatigue, and new shortness of breath starting today.  Her caregiver does state that she was seen in the ED 5 days ago for COPD exacerbation and treated with nebulized albuterol, prednisone, and azithromycin.  She did not have a COVID-19 test when she was in the ED a few days ago.  She has an appointment with her PCP tomorrow, but wanted to be seen sooner since she has felt short of breath today.  Patient's caregiver says that they did call EMS earlier today for low oxygen saturations and decreased heart rate, but were advised that she could just wait to see her PCP.  They state that they were declined EMS transport to ED.  They called PCP today and were advised to follow-up in the ED or urgent care.  As mentioned, patient does have COPD and sometimes requires supplemental O2. They deny fever. She says she always has a cough and does not believe it is any different. Her caregiver says it seems worse. Patient denies chest pain or dizziness.   They deny any known COVID-19 exposure and she has not been vaccinated for COVID-19. No other complaints or concerns today.  HPI  Past Medical History:  Diagnosis Date  . Abdominal aortic aneurysm (AAA) ([place])   . Arthritis   . COPD (chronic obstructive pulmonary disease) ([place])   . GERD (gastroesophageal reflux disease)   . Headache    sinus  . History of adenomatous polyp of colon   . Hyperlipidemia   . Hypertension   . Psoriasis   . Right lumbar radiculitis   . Vitamin D deficiency      Patient Active Problem List   Diagnosis Date Noted  . Left arm cellulitis 06/26/2020  . Hematoma of right hip 06/18/2020  . Congestive heart failure ([place]) 06/18/2020  . Weakness   . Closed nondisplaced fracture of distal phalanx of right little finger 06/06/2020  . Closed nondisplaced fracture of distal phalanx of right thumb 06/06/2020  . Protein-calorie malnutrition, severe 06/05/2020  . CAP (community acquired pneumonia) 06/03/2020  . Hypoalbuminemia 06/03/2020  . Cutaneous horn 06/03/2020  . Pleural effusion 06/03/2020  . Acute on chronic diastolic CHF (congestive heart failure) ([place]) 06/03/2020  . Acute respiratory failure with hypoxia ([place]) 06/02/2020  . Irregular heartbeat 05/30/2020  . Head injury 05/30/2020  . Bilateral edema of lower extremity 05/30/2020  . Closed fracture of right hand 05/30/2020  . Blood transfusion reaction 05/30/2020  . Symptomatic anemia 05/26/2020  . CKD (chronic kidney disease), stage III ([place]) 05/26/2020  . Hematoma of right thigh 05/26/2020  . Finger fracture, right 05/26/2020  . Dyspnea 05/26/2020  . Acute deep vein thrombosis (DVT) of tibial vein of left lower extremity ([place]) 04/05/2020  . Mental confusion 04/01/2020  . Unintentional weight loss 03/01/2020  . Fall (on)(from) sidewalk curb, initial encounter 03/01/2020  . Memory loss 03/01/2020  . Rash of back 05/15/2019  . Allergic rhinitis 01/10/2018  . Cholelithiasis 03/16/2016  . Peripheral arterial disease ([place])  02/05/2016  . 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 Abdominal aortic aneurysm (AAA) ([place]) 01/13/2016  . 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 Nerve root inflammation 10/25/2015  . Psoriatic arthritis ([place]) 10/07/2015  . 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 Right hip pain 08/12/2015  . Arthralgia of multiple joints 08/12/2015  . Age related osteoporosis 08/12/2015  . Chronic obstructive pulmonary disease ([place]) 06/24/2015  . Senile purpura ([place]) 06/24/2015  .  Abnormal serum level of alkaline phosphatase 06/18/2015  . Elevated hemoglobin ([place]) 06/18/2015  . Lethargy 06/18/2015  . Fistula 06/18/2015  . Blood glucose elevated 06/18/2015  . Gastro-esophageal reflux disease without esophagitis 06/18/2015  . Restless leg 06/18/2015  . Avitaminosis D 06/18/2015  . Mechanical and motor problems with internal organs 10/11/2009  . Hypercholesteremia 10/11/2009  . Arthritis, degenerative 09/25/2009  . Essential (primary) hypertension 09/25/2009  . Psoriasis 09/25/2009  . Compulsive tobacco user syndrome 09/25/2009    Past Surgical History:  Procedure Laterality Date  . APPENDECTOMY  2000  . BREAST BIOPSY Right 09/2016   radial scar;COMPLEX SCLEROSING LESION  . BREAST LUMPECTOMY Right 11/24/2016   COMPLEX SCLEROSING LESION  . BREAST LUMPECTOMY WITH NEEDLE LOCALIZATION Right 11/24/2016   Procedure: BREAST LUMPECTOMY WITH NEEDLE LOCALIZATION;  Surgeon: Leonie Green, MD;  Location: [facility] ORS;  Service: General;  Laterality: Right;  . COLONOSCOPY WITH PROPOFOL N/A 08/10/2016   Procedure: COLONOSCOPY WITH PROPOFOL;  Surgeon: Manya Silvas, MD;  Location: [facility] ENDOSCOPY;  Service: Endoscopy;  Laterality: N/A;  . ESOPHAGOGASTRODUODENOSCOPY (EGD) WITH PROPOFOL N/A 08/10/2016   Procedure: ESOPHAGOGASTRODUODENOSCOPY (EGD) WITH PROPOFOL;  Surgeon: Manya Silvas, MD;  Location: [facility] ENDOSCOPY;  Service: Endoscopy;  Laterality: N/A;  . FOOT SURGERY Bilateral   . JOINT REPLACEMENT Right 01/27/2016   hip  . 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 TOTAL HIP ARTHROPLASTY Right 01/27/2016   Procedure: TOTAL HIP ARTHROPLASTY;  Surgeon: Dereck Leep, MD;  Location: [facility] ORS;  Service: Orthopedics;  Laterality: Right;    OB History   No obstetric history on file.      Home Medications    Prior to Admission medications   Medication Sig Start Date End Date Taking? Authorizing Provider  acetaminophen (TYLENOL) 325 MG tablet Take 2 tablets (650 mg total) by  mouth every 6 (six) hours as needed for mild pain (or Fever >/= 101). 06/06/20   Nita Sells, MD  albuterol (VENTOLIN HFA) 108 (90 Base) MCG/ACT inhaler INHALE 1-2 PUFFS INTO THE LUNGS EVERY 6 HOURS AS NEEDED FOR WHEEZING OR SHORTNESS OF BREATH. 05/22/20   Trinna Post, PA-C  albuterol (VENTOLIN HFA) 108 (90 Base) MCG/ACT inhaler Inhale 2 puffs into the lungs every 6 (six) hours as needed for wheezing or shortness of breath. 07/03/20   Nena Polio, MD  apixaban (ELIQUIS) 5 MG TABS tablet Take 1 tablet (5 mg total) by mouth 2 (two) times daily. 06/09/20   Nita Sells, MD  ascorbic acid (VITAMIN C) 250 MG tablet Take 1 tablet (250 mg total) by mouth 2 (two) times daily. 06/06/20   Nita Sells, MD  aspirin EC 81 MG tablet Take 81 mg by mouth daily. Swallow whole.    [provider]  azithromycin (ZITHROMAX Z-PAK) 250 MG tablet Take 2 tablets (500 mg) on  Day 1,  followed by 1 tablet (250 mg) once daily on Days 2 through 5. 07/03/20 07/08/20  Nena Polio, MD  betamethasone dipropionate (DIPROLENE) 0.05 % ointment Apply topically daily. 01/01/20  Ralene Bathe, MD  calcipotriene (DOVONOX) 0.005 % cream APPLY TO SKIN ONCE A DAY APPLY DAILY ON HANDS, FEET AND WRIST X 2 WEEKS, THEN USE 5 DAYS A WEEK 01/01/20   Ralene Bathe, MD  cephALEXin (KEFLEX) 500 MG capsule Take 1 capsule (500 mg total) by mouth 2 (two) times daily. 06/26/20   Flinchum, Kelby Aline, FNP  Cholecalciferol (VITAMIN D3) 2000 units TABS Take 2,000 Units by mouth daily.    [provider]  clobetasol (TEMOVATE) 0.05 % external solution APPLY TO AFFECTED AREA TWICE A DAY 12/03/19   Bacigalupo, Dionne Bucy, MD  COSENTYX SENSOREADY, 300 MG, 150 MG/ML SOAJ Inject 2 Syringes into the skin every 28 (twenty-eight) days. 05/31/20   [provider]  donepezil (ARICEPT) 5 MG tablet Take 5 mg by mouth at bedtime.    [provider]  doxycycline (VIBRA-TABS) 100 MG tablet Take 1  tablet (100 mg total) by mouth 2 (two) times daily. 06/26/20   Flinchum, Kelby Aline, FNP  ferrous [id number] C-folic acid (TRINSICON / FOLTRIN) capsule Take 1 capsule by mouth 2 (two) times daily after a meal. 05/28/20   Lorella Nimrod, MD  ferrous sulfate 325 (65 FE) MG tablet Take 325 mg by mouth 2 (two) times daily. 05/28/20   [provider]  fluticasone furoate-vilanterol (BREO ELLIPTA) 100-25 MCG/INH AEPB Inhale 1 puff into the lungs daily. 05/22/20   Trinna Post, PA-C  furosemide (LASIX) 40 MG tablet Take 1 tablet (40 mg total) by mouth daily. If weight 127 lb or less, take 20 mg by mouth daily. 07/01/20   Minna Merritts, MD  gabapentin (NEURONTIN) 300 MG capsule TAKE 1 CAPSULE BY MOUTH TWICE A DAY 05/07/20   Bacigalupo, Dionne Bucy, MD  guaiFENesin (MUCINEX) 600 MG 12 hr tablet Take 1 tablet (600 mg total) by mouth 2 (two) times daily. 06/06/20   Nita Sells, MD  levocetirizine (XYZAL) 5 MG tablet TAKE 1 TABLET BY MOUTH EVERY DAY IN THE EVENING 12/03/19   Bacigalupo, Dionne Bucy, MD  methotrexate (RHEUMATREX) 2.5 MG tablet Take 15 mg by mouth once a week.  05/23/20   [provider]  metoprolol succinate (TOPROL-XL) 50 MG 24 hr tablet TAKE 1 TABLET (50 MG TOTAL) BY MOUTH DAILY. TAKE WITH OR IMMEDIATELY FOLLOWING A MEAL. 06/27/20   Bacigalupo, Dionne Bucy, MD  nicotine (NICODERM CQ - DOSED IN MG/24 HOURS) 21 mg/24hr patch Place 1 patch (21 mg total) onto the skin daily. 06/24/20   Minna Merritts, MD  pantoprazole (PROTONIX) 20 MG tablet TAKE 1 TABLET (20 MG TOTAL) BY MOUTH 2 (TWO) TIMES DAILY BEFORE A MEAL. 06/26/20   Bacigalupo, Dionne Bucy, MD  potassium chloride SA (KLOR-CON) 20 MEQ tablet Take 2 tablets (40 mEq total) by mouth daily. 06/06/20   Nita Sells, MD  predniSONE (DELTASONE) 20 MG tablet Take 1 tablet (20 mg total) by mouth daily. 07/03/20 07/03/21  Nena Polio, MD  rOPINIRole (REQUIP) 1 MG tablet TAKE 1 TABLET (1 MG TOTAL) BY MOUTH AT  BEDTIME. 06/26/20   Bacigalupo, Dionne Bucy, MD  simvastatin (ZOCOR) 20 MG tablet TAKE 1 TABLET BY MOUTH EVERY DAY 10/01/19   Bacigalupo, Dionne Bucy, MD  traMADol (ULTRAM) 50 MG tablet TAKE 1 TABLET BY MOUTH EVERY 6 HOURS AS NEEDED FOR MODERATE OR SEVERE PAIN 07/05/20   [name], MD    Family History Family History  Problem Relation Age of Onset  . Cancer Mother  kidney  . Arthritis Father   . Heart disease Father   . COPD Sister   . Fibromyalgia Sister   . Osteoarthritis Sister   . Osteoarthritis Maternal Aunt   . Colon cancer Paternal Aunt   . Colon cancer Paternal Uncle   . Colon cancer Paternal Grandmother   . Breast cancer Neg Hx     Social History Social History   Tobacco Use  . Smoking status: Current Every Day Smoker    Packs/day: 1.50    Years: 40.00    Pack years: 60.00  . Smokeless tobacco: Never Used  . Tobacco comment: 0.5 PPD 06/19/2020  Vaping Use  . Vaping Use: Some days  Substance Use Topics  . Alcohol use: Yes    Comment: occasionally glass of wine  . Drug use: No     Allergies   Clindamycin/lincomycin and Amoxicillin   Review of Systems Review of Systems  Constitutional: Positive for appetite change and fatigue. Negative for chills, diaphoresis and fever.  HENT: Negative for congestion, ear pain, rhinorrhea, sinus pressure, sinus pain and sore throat.   Respiratory: Positive for cough and shortness of breath. Negative for chest tightness and wheezing.   Cardiovascular: Negative for chest pain.  Gastrointestinal: Negative for abdominal pain, nausea and vomiting.  Genitourinary: Negative for dysuria and frequency.  Musculoskeletal: Negative for arthralgias and myalgias.  Skin: Negative for rash.  Neurological: Negative for dizziness, weakness and headaches.  Hematological: Negative for adenopathy.     Physical Exam Triage Vital Signs ED Triage Vitals [07/08/20 1531]  Enc Vitals Group     BP 118/73     Pulse Rate (!) 101      Resp 20     Temp 98.4 F (36.9 C)     Temp Source Oral     SpO2 93 %     Weight 128 lb 11.2 oz (58.4 kg)     Height 5\' 1"  (1.549 m)     Head Circumference      Peak Flow      Pain Score      Pain Loc      Pain Edu?      Excl. in [place]?    No data found.  Updated Vital Signs BP 118/73 (BP Location: Right Arm)   Pulse (!) 101   Temp 98.4 F (36.9 C) (Oral)   Resp 20   Ht 5\' 1"  (1.549 m)   Wt 128 lb 11.2 oz (58.4 kg)   SpO2 93%   BMI 24.32 kg/m       Physical Exam Vitals and nursing note reviewed.  Constitutional:      General: She is not in acute distress.    Appearance: Normal appearance. She is not ill-appearing or toxic-appearing.  HENT:     Head: Normocephalic and atraumatic.     Nose: Nose normal.     Mouth/Throat:     Mouth: Mucous membranes are moist.     Pharynx: Oropharynx is clear.  Eyes:     General: No scleral icterus.       Right eye: No discharge.        Left eye: No discharge.     Conjunctiva/sclera: Conjunctivae normal.  Cardiovascular:     Rate and Rhythm: Regular rhythm. Tachycardia present.     Heart sounds: Normal heart sounds.  Pulmonary:     Effort: Pulmonary effort is normal. No respiratory distress.     Breath sounds: Wheezing (few scattered wheezes bilateral lower lobes) present. No  rhonchi or rales.  Musculoskeletal:     Cervical back: Neck supple.  Skin:    General: Skin is dry.  Neurological:     General: No focal deficit present.     Mental Status: She is alert and oriented to person, place, and time. Mental status is at baseline.     Motor: No weakness.     Gait: Gait normal.  Psychiatric:        Mood and Affect: Mood normal.        Behavior: Behavior normal.      UC Treatments / Results  Labs (all labs ordered are listed, but only abnormal results are displayed) Labs Reviewed  CBC WITH DIFFERENTIAL/PLATELET - Abnormal; Notable for the following components:      Result Value   WBC 2.0 (*)    RBC 3.17 (*)     Hemoglobin 10.2 (*)    HCT 29.5 (*)    RDW 17.4 (*)    Platelets 112 (*)    Neutro Abs 1.4 (*)    Lymphs Abs 0.5 (*)    All other components within normal limits  COMPREHENSIVE METABOLIC PANEL - Abnormal; Notable for the following components:   Chloride 97 (*)    Glucose, Bld 116 (*)    Calcium 8.5 (*)    Total Protein 6.2 (*)    Albumin 3.3 (*)    All other components within normal limits  RESP PANEL BY RT-PCR (FLU A&B, COVID) [id number]    EKG   Radiology DG Chest 2 View  Result Date: 07/08/2020 CLINICAL DATA:  Shortness of breath EXAM: CHEST - 2 VIEW COMPARISON:  July 03, 2020 FINDINGS: There is airspace opacity consistent with pneumonia in the right base and to a lesser extent in the right mid lung. Lungs elsewhere are clear. Heart size and pulmonary vascularity are normal. No adenopathy. There is aortic atherosclerosis. There is degenerative change in the thoracic spine. IMPRESSION: Airspace opacity right base anteriorly and to a lesser extent right mid lung. Appearance indicative of multifocal pneumonia. Advise check of COVID-19 status given this appearance. Lungs elsewhere clear. Heart size normal. No adenopathy. Aortic Atherosclerosis (ICD10-I70.0). These results will be called to the ordering clinician or representative by the Radiologist Assistant, and communication documented in the PACS or [employer]. Electronically Signed   By: Lowella Grip III M.D.   On: 07/08/2020 16:07    Procedures Procedures (including critical care time)  Medications Ordered in UC Medications - No data to display  Initial Impression / Assessment and Plan / UC Course  I have reviewed the triage vital signs and the nursing notes.  Pertinent labs & imaging results that were available during my care of the patient were reviewed by me and considered in my medical decision making (see chart for details).   74 year old female with history of COPD presenting with niece for increased  shortness of breath today.  Of note, she was seen in the ED 5 days ago for COPD exacerbation and has been taking prednisone and azithromycin.  Patient was taking Keflex and doxycycline before that for a skin problem.  Patient is afebrile.  Pulse elevated at 101 bpm.  Oxygen 93% on room air.  Blood pressure within normal limits.  On exam, she has mild wheezing of the lower lobes.  She does not appear to be in any respiratory distress.  Respiratory panel obtained and negative for influenza and COVID-19 today.  Chest x-ray obtained which shows multifocal pneumonia. I did independently review  images.  Labs drawn which show pancytopenia with white blood cell count decreased at 2.0 K/uL  Patient with multifocal pneumonia and decreased white blood cell count.  Patient has multiple risk factors.  Advised patient and niece that I suggest she be seen in the ED for evaluation and possible admission.  They are agreeable EMS called.  EMS given report and patient left in stable condition.  Final Clinical Impressions(s) / UC Diagnoses   Final diagnoses:  Pneumonia of right lung due to infectious organism, unspecified part of lung  SOB (shortness of breath)  COPD exacerbation (HCC)  Neutropenia, unspecified type [facility])     Discharge Instructions     You have been advised to follow up immediately in the emergency department for concerning signs.symptoms. If you declined EMS transport, please have a family member take you directly to the ED at this time. Do not delay. Based on concerns about condition, if you do not follow up in th e ED, you may risk poor outcomes including worsening of condition, delayed treatment and potentially life threatening issues. If you have declined to go to the ED at this time, you should call your PCP immediately to set up a follow up appointment.  Go to ED for red flag symptoms, including; fevers you cannot reduce with Tylenol/Motrin, severe headaches, vision changes,  numbness/weakness in part of the body, lethargy, confusion, intractable vomiting, severe dehydration, chest pain, breathing difficulty, severe persistent abdominal or pelvic pain, signs of severe infection (increased redness, swelling of an area), feeling faint or passing out, dizziness, etc. You should especially go to the ED for sudden acute worsening of condition if you do not elect to go at this time.     ED Prescriptions    None     PDMP not reviewed this encounter.   Danton Clap, PA-C 07/10/20 1202

## 2020-07-08 NOTE — Telephone Encounter (Signed)
Does anyone have availability today?  I don't, but she needs to be seen.

## 2020-07-08 NOTE — Telephone Encounter (Signed)
Spoke with patients niece because she was on schedule this afternoon to be seen at 3pm with complaints of shortness of breath. Patients Niece states that patient has been complaining of shortness of breath for one week, she is currently on 3L of oxygen at home but her niece believes that machine is not working properly and has been in contact with medial supply company. Patients niece states that EMS was called to home today, patient had o2 of 70 and heart rate in 40s upon arrival, it was reported that patient seemed to be fatigued and she admitted to sleeping all day yesterday. Patients niece states that she was not present at the time EMS came out to the home but states that patient had mentioned that EMS had heard crackling in her right lower lobe. Patient  States that EMS told her that she should be evaluated by PCP today because wait time at hospital would be long. Advised that patient should see immediate in person evaluation, she was given information to go to [facility], she is aware that if condition worsens to go directly to ED. Patients niece voiced understanding. KW

## 2020-07-08 NOTE — Telephone Encounter (Signed)
Patient's daughter is calling with the [employer] on line. Patient is SOB alert and oriented. There is some crackles in the base according to EMS.  Appt was scheduled with NT on the phone. EMT stated that if they could see her this afternoon they would not take her to the hospital.  Appt was scheduled this afternoon with [facility] for an OV. Just want to be sure that this is fine. Patient went to the ED and a hospital follow up was not scheduled. No available appt with Dr. Jacinto Reap.  Patient is currently on 3 units of oxygen.  Please advise CB- [phone]

## 2020-07-08 NOTE — Telephone Encounter (Signed)
PEC agent has made pt. Appointment for today. Pt. Seen in ED last week with COPD exacerbation. Today was walking around and became short of breath. EMS called. States VS stable. Pt. No longer short of breath per EMS. Pt. Having conversation without difficulty. Request pt. Be seen today. Practice currently closed for lunch. Answer Assessment - Initial Assessment Questions 1. RESPIRATORY STATUS: "Describe your breathing?" (e.g., wheezing, shortness of breath, unable to speak, severe coughing)      Shortness of breath 2. ONSET: "When did this breathing problem begin?"      Today 3. PATTERN "Does the difficult breathing come and go, or has it been constant since it started?"      Come and goes 4. SEVERITY: "How bad is your breathing?" (e.g., mild, moderate, severe)    - MILD: No SOB at rest, mild SOB with walking, speaks normally in sentences, can lay down, no retractions, pulse < 100.    - MODERATE: SOB at rest, SOB with minimal exertion and prefers to sit, cannot lie down flat, speaks in phrases, mild retractions, audible wheezing, pulse 100-120.    - SEVERE: Very SOB at rest, speaks in single words, struggling to breathe, sitting hunched forward, retractions, pulse > 120      Mild 5. RECURRENT SYMPTOM: "Have you had difficulty breathing before?" If Yes, ask: "When was the last time?" and "What happened that time?"      Yes 6. CARDIAC HISTORY: "Do you have any history of heart disease?" (e.g., heart attack, angina, bypass surgery, angioplasty)      No 7. LUNG HISTORY: "Do you have any history of lung disease?"  (e.g., pulmonary embolus, asthma, emphysema)     Copd 8. CAUSE: "What do you think is causing the breathing problem?"      COPD 9. OTHER SYMPTOMS: "Do you have any other symptoms? (e.g., dizziness, runny nose, cough, chest pain, fever)     No 10. PREGNANCY: "Is there any chance you are pregnant?" "When was your last menstrual period?"       No 11. TRAVEL: "Have you traveled out of the  country in the last month?" (e.g., travel history, exposures)       No  Protocols used: BREATHING DIFFICULTY-A-AH

## 2020-07-08 NOTE — Discharge Instructions (Signed)

## 2020-07-09 ENCOUNTER — Inpatient Hospital Stay
Admission: EM | Admit: 2020-07-09 | Discharge: 2020-07-10 | DRG: 193 | Disposition: A | Discharge status: Home Health | Payer: PPO | Attending: Internal Medicine | Admitting: Internal Medicine

## 2020-07-09 ENCOUNTER — Encounter: Payer: Self-pay | Admitting: Family Medicine

## 2020-07-09 ENCOUNTER — Ambulatory Visit: Payer: Self-pay | Admitting: Family Medicine

## 2020-07-09 DIAGNOSIS — Z881 Allergy status to other antibiotic agents status: Secondary | ICD-10-CM | POA: Diagnosis not present

## 2020-07-09 DIAGNOSIS — J189 Pneumonia, unspecified organism: Secondary | ICD-10-CM | POA: Diagnosis present

## 2020-07-09 DIAGNOSIS — D72819 Decreased white blood cell count, unspecified: Secondary | ICD-10-CM | POA: Diagnosis present

## 2020-07-09 DIAGNOSIS — J9601 Acute respiratory failure with hypoxia: Secondary | ICD-10-CM

## 2020-07-09 DIAGNOSIS — R0602 Shortness of breath: Secondary | ICD-10-CM | POA: Diagnosis present

## 2020-07-09 DIAGNOSIS — N182 Chronic kidney disease, stage 2 (mild): Secondary | ICD-10-CM | POA: Diagnosis present

## 2020-07-09 DIAGNOSIS — Z86010 Personal history of colonic polyps: Secondary | ICD-10-CM | POA: Diagnosis not present

## 2020-07-09 DIAGNOSIS — Z880 Allergy status to penicillin: Secondary | ICD-10-CM | POA: Diagnosis not present

## 2020-07-09 DIAGNOSIS — I130 Hypertensive heart and chronic kidney disease with heart failure and stage 1 through stage 4 chronic kidney disease, or unspecified chronic kidney disease: Secondary | ICD-10-CM | POA: Diagnosis present

## 2020-07-09 DIAGNOSIS — E559 Vitamin D deficiency, unspecified: Secondary | ICD-10-CM | POA: Diagnosis present

## 2020-07-09 DIAGNOSIS — Z825 Family history of asthma and other chronic lower respiratory diseases: Secondary | ICD-10-CM | POA: Diagnosis not present

## 2020-07-09 DIAGNOSIS — I471 Supraventricular tachycardia: Secondary | ICD-10-CM | POA: Diagnosis present

## 2020-07-09 DIAGNOSIS — Z86718 Personal history of other venous thrombosis and embolism: Secondary | ICD-10-CM

## 2020-07-09 DIAGNOSIS — J441 Chronic obstructive pulmonary disease with (acute) exacerbation: Secondary | ICD-10-CM | POA: Diagnosis not present

## 2020-07-09 DIAGNOSIS — Z8261 Family history of arthritis: Secondary | ICD-10-CM | POA: Diagnosis not present

## 2020-07-09 DIAGNOSIS — I509 Heart failure, unspecified: Secondary | ICD-10-CM

## 2020-07-09 DIAGNOSIS — Z20822 Contact with and (suspected) exposure to covid-19: Secondary | ICD-10-CM | POA: Diagnosis present

## 2020-07-09 DIAGNOSIS — I739 Peripheral vascular disease, unspecified: Secondary | ICD-10-CM | POA: Diagnosis not present

## 2020-07-09 DIAGNOSIS — J440 Chronic obstructive pulmonary disease with acute lower respiratory infection: Secondary | ICD-10-CM | POA: Diagnosis present

## 2020-07-09 DIAGNOSIS — L4050 Arthropathic psoriasis, unspecified: Secondary | ICD-10-CM | POA: Diagnosis not present

## 2020-07-09 DIAGNOSIS — D61818 Other pancytopenia: Secondary | ICD-10-CM | POA: Diagnosis not present

## 2020-07-09 DIAGNOSIS — I5032 Chronic diastolic (congestive) heart failure: Secondary | ICD-10-CM | POA: Diagnosis not present

## 2020-07-09 DIAGNOSIS — Z8249 Family history of ischemic heart disease and other diseases of the circulatory system: Secondary | ICD-10-CM | POA: Diagnosis not present

## 2020-07-09 DIAGNOSIS — K219 Gastro-esophageal reflux disease without esophagitis: Secondary | ICD-10-CM | POA: Diagnosis present

## 2020-07-09 DIAGNOSIS — Z96641 Presence of right artificial hip joint: Secondary | ICD-10-CM | POA: Diagnosis present

## 2020-07-09 DIAGNOSIS — E785 Hyperlipidemia, unspecified: Secondary | ICD-10-CM | POA: Diagnosis present

## 2020-07-09 DIAGNOSIS — F17200 Nicotine dependence, unspecified, uncomplicated: Secondary | ICD-10-CM | POA: Diagnosis present

## 2020-07-09 DIAGNOSIS — I4719 Other supraventricular tachycardia: Secondary | ICD-10-CM | POA: Diagnosis present

## 2020-07-09 DIAGNOSIS — I714 Abdominal aortic aneurysm, without rupture: Secondary | ICD-10-CM | POA: Diagnosis present

## 2020-07-09 LAB — RESP PANEL BY RT-PCR (FLU A&B, COVID) ARPGX2
Influenza A by PCR: NEGATIVE
Influenza B by PCR: NEGATIVE
SARS Coronavirus 2 by RT PCR: NEGATIVE

## 2020-07-09 LAB — STREP PNEUMONIAE URINARY ANTIGEN: Strep Pneumo Urinary Antigen: NEGATIVE

## 2020-07-09 LAB — LACTIC ACID, PLASMA: Lactic Acid, Venous: 1.5 mmol/L (ref 0.5–1.9)

## 2020-07-09 LAB — PROCALCITONIN: Procalcitonin: <0.1 ng/mL

## 2020-07-09 MED ORDER — LACTATED RINGERS IV BOLUS
1000.0000 mL | Freq: Once | INTRAVENOUS | Status: AC
  Administered 2020-07-09: 1000 mL via INTRAVENOUS

## 2020-07-09 MED ORDER — ACETAMINOPHEN 325 MG PO TABS: 650.0000 mg | ORAL_TABLET | Freq: Four times a day (QID) | ORAL | Status: DC | PRN

## 2020-07-09 MED ORDER — GABAPENTIN 300 MG PO CAPS
300.0000 mg | ORAL_CAPSULE | Freq: Two times a day (BID) | ORAL | Status: DC
  Administered 2020-07-09 – 2020-07-10 (×3): 300 mg via ORAL
  Filled 2020-07-09 (×3): qty 1

## 2020-07-09 MED ORDER — SENNOSIDES-DOCUSATE SODIUM 8.6-50 MG PO TABS: 1.0000 | ORAL_TABLET | Freq: Every evening | ORAL | Status: DC | PRN

## 2020-07-09 MED ORDER — FLUTICASONE FUROATE-VILANTEROL 100-25 MCG/INH IN AEPB
1.0000 | INHALATION_SPRAY | Freq: Every day | RESPIRATORY_TRACT | Status: DC
  Filled 2020-07-09: qty 28

## 2020-07-09 MED ORDER — VANCOMYCIN HCL IN DEXTROSE 1-5 GM/200ML-% IV SOLN
1000.0000 mg | Freq: Once | INTRAVENOUS | Status: AC
  Administered 2020-07-09: 1000 mg via INTRAVENOUS
  Filled 2020-07-09: qty 200

## 2020-07-09 MED ORDER — SODIUM CHLORIDE 0.9 % IV SOLN
1.0000 g | Freq: Once | INTRAVENOUS | Status: AC
  Administered 2020-07-09: 1 g via INTRAVENOUS
  Filled 2020-07-09: qty 1

## 2020-07-09 MED ORDER — PREDNISONE 20 MG PO TABS
40.0000 mg | ORAL_TABLET | Freq: Every day | ORAL | Status: DC
  Administered 2020-07-10: 40 mg via ORAL
  Filled 2020-07-09: qty 2

## 2020-07-09 MED ORDER — IPRATROPIUM-ALBUTEROL 0.5-2.5 (3) MG/3ML IN SOLN
3.0000 mL | Freq: Once | RESPIRATORY_TRACT | Status: AC
  Administered 2020-07-09: 3 mL via RESPIRATORY_TRACT
  Filled 2020-07-09: qty 9

## 2020-07-09 MED ORDER — IPRATROPIUM-ALBUTEROL 0.5-2.5 (3) MG/3ML IN SOLN
3.0000 mL | Freq: Once | RESPIRATORY_TRACT | Status: AC
  Administered 2020-07-09: 3 mL via RESPIRATORY_TRACT

## 2020-07-09 MED ORDER — TRIAMCINOLONE ACETONIDE 0.5 % EX CREA
TOPICAL_CREAM | Freq: Every day | CUTANEOUS | Status: DC
  Filled 2020-07-09: qty 15

## 2020-07-09 MED ORDER — NICOTINE 21 MG/24HR TD PT24
21.0000 mg | MEDICATED_PATCH | Freq: Every day | TRANSDERMAL | Status: DC
  Administered 2020-07-10: 21 mg via TRANSDERMAL
  Filled 2020-07-09 (×2): qty 1

## 2020-07-09 MED ORDER — SODIUM CHLORIDE 0.9 % IV SOLN
2.0000 g | INTRAVENOUS | Status: DC
  Administered 2020-07-09 – 2020-07-10 (×2): 2 g via INTRAVENOUS
  Filled 2020-07-09 (×3): qty 20

## 2020-07-09 MED ORDER — PANTOPRAZOLE SODIUM 20 MG PO TBEC
20.0000 mg | DELAYED_RELEASE_TABLET | Freq: Two times a day (BID) | ORAL | Status: DC
  Administered 2020-07-09 – 2020-07-10 (×2): 20 mg via ORAL
  Filled 2020-07-09 (×7): qty 1

## 2020-07-09 MED ORDER — SODIUM CHLORIDE 0.9% FLUSH
3.0000 mL | Freq: Two times a day (BID) | INTRAVENOUS | Status: DC
  Administered 2020-07-09: 3 mL via INTRAVENOUS

## 2020-07-09 MED ORDER — FUROSEMIDE 40 MG PO TABS
40.0000 mg | ORAL_TABLET | Freq: Every day | ORAL | Status: DC
  Administered 2020-07-09 – 2020-07-10 (×2): 40 mg via ORAL
  Filled 2020-07-09 (×2): qty 1

## 2020-07-09 MED ORDER — METOPROLOL TARTRATE 5 MG/5ML IV SOLN
5.0000 mg | INTRAVENOUS | Status: DC | PRN
  Administered 2020-07-09: 5 mg via INTRAVENOUS
  Filled 2020-07-09: qty 5

## 2020-07-09 MED ORDER — METOPROLOL SUCCINATE ER 50 MG PO TB24
50.0000 mg | ORAL_TABLET | Freq: Every day | ORAL | Status: DC
  Administered 2020-07-09 – 2020-07-10 (×2): 50 mg via ORAL
  Filled 2020-07-09 (×2): qty 1

## 2020-07-09 MED ORDER — METHYLPREDNISOLONE SODIUM SUCC 125 MG IJ SOLR
60.0000 mg | Freq: Four times a day (QID) | INTRAMUSCULAR | Status: AC
  Administered 2020-07-09 – 2020-07-10 (×4): 60 mg via INTRAVENOUS
  Filled 2020-07-09 (×4): qty 2

## 2020-07-09 MED ORDER — ONDANSETRON HCL 4 MG PO TABS: 4.0000 mg | ORAL_TABLET | Freq: Four times a day (QID) | ORAL | Status: DC | PRN

## 2020-07-09 MED ORDER — GUAIFENESIN ER 600 MG PO TB12
600.0000 mg | ORAL_TABLET | Freq: Two times a day (BID) | ORAL | Status: DC
  Administered 2020-07-09 – 2020-07-10 (×3): 600 mg via ORAL
  Filled 2020-07-09 (×3): qty 1

## 2020-07-09 MED ORDER — TRAMADOL HCL 50 MG PO TABS
50.0000 mg | ORAL_TABLET | Freq: Four times a day (QID) | ORAL | Status: DC | PRN
  Administered 2020-07-09: 50 mg via ORAL
  Filled 2020-07-09: qty 1

## 2020-07-09 MED ORDER — POTASSIUM CHLORIDE CRYS ER 20 MEQ PO TBCR
40.0000 meq | EXTENDED_RELEASE_TABLET | Freq: Every day | ORAL | Status: DC
  Administered 2020-07-09 – 2020-07-10 (×2): 40 meq via ORAL
  Filled 2020-07-09 (×2): qty 2

## 2020-07-09 MED ORDER — SODIUM CHLORIDE 0.9 % IV SOLN: 250.0000 mL | INTRAVENOUS | Status: DC | PRN

## 2020-07-09 MED ORDER — SIMVASTATIN 20 MG PO TABS
20.0000 mg | ORAL_TABLET | Freq: Every day | ORAL | Status: DC
  Administered 2020-07-09 – 2020-07-10 (×2): 20 mg via ORAL
  Filled 2020-07-09: qty 2
  Filled 2020-07-09: qty 1

## 2020-07-09 MED ORDER — ALBUTEROL SULFATE (2.5 MG/3ML) 0.083% IN NEBU: 2.5000 mg | INHALATION_SOLUTION | RESPIRATORY_TRACT | Status: DC | PRN

## 2020-07-09 MED ORDER — SODIUM CHLORIDE 0.9 % IV SOLN
500.0000 mg | INTRAVENOUS | Status: DC
  Administered 2020-07-09 – 2020-07-10 (×2): 500 mg via INTRAVENOUS
  Filled 2020-07-09 (×3): qty 500

## 2020-07-09 MED ORDER — ACETAMINOPHEN 650 MG RE SUPP: 650.0000 mg | Freq: Four times a day (QID) | RECTAL | Status: DC | PRN

## 2020-07-09 MED ORDER — ONDANSETRON HCL 4 MG/2ML IJ SOLN: 4.0000 mg | Freq: Four times a day (QID) | INTRAMUSCULAR | Status: DC | PRN

## 2020-07-09 MED ORDER — SODIUM CHLORIDE 0.9% FLUSH: 3.0000 mL | INTRAVENOUS | Status: DC | PRN

## 2020-07-09 MED ORDER — DONEPEZIL HCL 5 MG PO TABS
10.0000 mg | ORAL_TABLET | Freq: Every day | ORAL | Status: DC
  Administered 2020-07-09 – 2020-07-10 (×2): 10 mg via ORAL
  Filled 2020-07-09 (×2): qty 2

## 2020-07-09 MED ORDER — ROPINIROLE HCL 1 MG PO TABS
1.0000 mg | ORAL_TABLET | Freq: Every day | ORAL | Status: DC
  Administered 2020-07-09: 1 mg via ORAL
  Filled 2020-07-09: qty 1

## 2020-07-09 MED ORDER — METHYLPREDNISOLONE SODIUM SUCC 125 MG IJ SOLR
125.0000 mg | Freq: Once | INTRAMUSCULAR | Status: AC
  Administered 2020-07-09: 125 mg via INTRAVENOUS
  Filled 2020-07-09: qty 2

## 2020-07-09 MED ORDER — ASPIRIN EC 81 MG PO TBEC
81.0000 mg | DELAYED_RELEASE_TABLET | Freq: Every day | ORAL | Status: DC
  Administered 2020-07-09: 81 mg via ORAL
  Filled 2020-07-09 (×2): qty 1

## 2020-07-09 MED ORDER — CLOBETASOL PROPIONATE 0.05 % EX CREA
TOPICAL_CREAM | CUTANEOUS | Status: DC
  Filled 2020-07-09: qty 15

## 2020-07-09 MED ORDER — APIXABAN 5 MG PO TABS
5.0000 mg | ORAL_TABLET | Freq: Two times a day (BID) | ORAL | Status: DC
  Administered 2020-07-09 – 2020-07-10 (×3): 5 mg via ORAL
  Filled 2020-07-09 (×3): qty 1

## 2020-07-09 NOTE — ED Notes (Signed)
Assumed care of pt at this time; assisted to restroom at this time. Ambulates well with minimal assist. Uses cane.

## 2020-07-09 NOTE — ED Notes (Signed)
[place] MD notified of pt increasing HR. Repeat EKG ordered and performed.

## 2020-07-09 NOTE — ED Notes (Signed)
Pt ambulatory to restroom without difficulty.

## 2020-07-09 NOTE — Progress Notes (Signed)
[facility] [place] NAME: Mariah [name]    MR#:  [id number]  [place]:  [birth year]  SUBJECTIVE:   Patient came in to the ER with increasing shortness of breath despite using her 3 L nasal cannula oxygen. She was found to have right lower lobe pneumonia. She was seen in the ER couple days ago sent home on oral antibiotics however she failed outpatient treatment. REVIEW OF SYSTEMS:   Review of Systems  Constitutional: Negative for chills, fever and weight loss.  HENT: Negative for ear discharge, ear pain and nosebleeds.   Eyes: Negative for blurred vision, pain and discharge.  Respiratory: Positive for cough and shortness of breath. Negative for sputum production, wheezing and stridor.   Cardiovascular: Negative for chest pain, palpitations, orthopnea and PND.  Gastrointestinal: Negative for abdominal pain, diarrhea, nausea and vomiting.  Genitourinary: Negative for frequency and urgency.  Musculoskeletal: Negative for back pain and joint pain.  Neurological: Positive for weakness. Negative for sensory change, speech change and focal weakness.  Psychiatric/Behavioral: Negative for depression and hallucinations. The patient is not nervous/anxious.    Tolerating Diet:yes Tolerating PT:   DRUG ALLERGIES:   Allergies  Allergen Reactions  . Clindamycin/Lincomycin     diarrhea  . Amoxicillin Diarrhea    VITALS:  Blood pressure 138/90, pulse 80, temperature 98 F (36.7 C), resp. rate 20, SpO2 96 %.  PHYSICAL EXAMINATION:   Physical Exam  GENERAL:  74 y.o.-year-old patient lying in the bed with no acute distress.  LUNGS: distant breath sounds bilaterally, no wheezing, rales, rhonchi. No use of accessory muscles of respiration.  CARDIOVASCULAR: S1, S2 normal. No murmurs, rubs, or gallops.  ABDOMEN: Soft, nontender, nondistended. Bowel sounds present. No organomegaly or mass.  EXTREMITIES: No cyanosis, clubbing or edema b/l.     NEUROLOGIC: Cranial nerves II through XII are intact. No focal Motor or sensory deficits b/l.   PSYCHIATRIC:  patient is alert and oriented x 3.  SKIN: No obvious rash, lesion, or ulcer.   LABORATORY PANEL:  CBC Recent Labs  Lab 07/08/20 1626  WBC 2.0*  HGB 10.2*  HCT 29.5*  PLT 112*    Chemistries  Recent Labs  Lab 07/08/20 1626  NA 135  K 3.5  CL 97*  CO2 29  GLUCOSE 116*  BUN 16  CREATININE 0.91  CALCIUM 8.5*  AST 18  ALT 14  ALKPHOS 95  BILITOT 0.7   Cardiac Enzymes No results for input(s): TROPONINI in the last 168 hours. RADIOLOGY:  DG Chest 2 View  Result Date: 07/08/2020 CLINICAL DATA:  Shortness of breath EXAM: CHEST - 2 VIEW COMPARISON:  July 03, 2020 FINDINGS: There is airspace opacity consistent with pneumonia in the right base and to a lesser extent in the right mid lung. Lungs elsewhere are clear. Heart size and pulmonary vascularity are normal. No adenopathy. There is aortic atherosclerosis. There is degenerative change in the thoracic spine. IMPRESSION: Airspace opacity right base anteriorly and to a lesser extent right mid lung. Appearance indicative of multifocal pneumonia. Advise check of COVID-19 status given this appearance. Lungs elsewhere clear. Heart size normal. No adenopathy. Aortic Atherosclerosis (ICD10-I70.0). These results will be called to the ordering clinician or representative by the Radiologist Assistant, and communication documented in the PACS or [employer]. Electronically Signed   By: Lowella Grip III M.D.   On: 07/08/2020 16:07   ASSESSMENT AND PLAN:  Mariah [name] is a 74 y.o. female with  medical history significant for psoriatic arthritis, COPD, nocturnal oxygen requirement, chronic diastolic CHF, CKD, memory loss, history of DVT on Eliquis, and recent treatment with steroids and antibiotics for COPD exacerbation, now presenting to the emergency department with progressive shortness of breath, hypoxia at home, cough,  and sinus congestion.  Patient was treated 1 week ago with azithromycin and prednisone for COPD exacerbation, was also on doxycycline and Keflex recently for skin infection, has had worsening shortness of breath and cough over the past couple days.  Pneumonia; COPD with acute on chronic exacerbation  - Presents with worsening SOB and hypoxia at home despite recent treatment with antibiotics and prednisone and is found to have CXR findings concerning for multifocal PNA, tachypnea at rest, and leukopenia  - Procalcitonin is undetectable  -- CXR findings right LL PNA -- she is negative for COVID and flu twice in 2 days  - Blood cultures negative --cont Solu-Medrol, cefepime and zithromax--change to oral in am if improving --cont Duonebs  -- patient wears oxygen chronically at home  Pancytopenia  - Hgb appears stable but WBC and platelets are decreased  - ANC is 1340, ALC 480 and platelets 112k on admission  - Likely due to infection and/or methotrexate  - Continue eval and treatment of pneumonia as above -- resume methotrexate if white count improves.    Multifocal atrial tachycardia  - Patient developed tachycardia to 130s in ED, EKG consistent with multifocal atrial tachycardia  - Likely precipitated by her pulmonary disease  - Continue metoprolol, check mag level, supplement potassium    Chronic diastolic CHF  - Appears compensated  - EF was normal in November 2021  - Continue Lasix, monitor weight     Psoriais; psoriatic arthritis  - Hold methotrexate while monitoring blood counts, continue topicals  -- follows with Dr. Precious Reel   History of DVT  - No evidence for acute VTE, continue Eliquis (home med)   CKD stage II  - SCr is 0.91 on admission, consistent with her apparent baseline  - Renally-dose medications, monitor    Memory loss  - Continue Aricept    PAD  - Continue ASA and statin, encourage smoking cessation   Chronic pain - No pain complaints on  admission  - Continue home medications    DVT prophylaxis: Eliquis  Code Status: Full  Family Communication: Discussed with patient  Disposition Plan:  Patient is from: home  Anticipated d/c is to: TBD Anticipated d/c date is: 1-2 days Patient currently: Pending improvement in respiratory status, repeat CBC Consults called: None  Admission status: Inpatient     TOTAL TIME TAKING CARE OF THIS PATIENT: *25* minutes.  >50% time spent on counselling and coordination of care  Note: This dictation was prepared with Dragon dictation along with smaller phrase technology. Any transcriptional errors that result from this process are unintentional.  Fritzi Mandes M.D    [facility]   CC: Primary care physician; [name], MDPatient ID: Mariah [name], female   DOB: 11/15/1945, 74 y.o.   MRN: [id number]

## 2020-07-09 NOTE — ED Provider Notes (Addendum)
[facility] Emergency Department Provider Note  ____________________________________________  Time seen: Approximately 2:22 AM  I have reviewed the triage vital signs and the nursing notes.   HISTORY  Chief Complaint Shortness of Breath  Level 5 caveat:  Portions of the history and physical were unable to be obtained due to dementia   HPI Mariah [name] is a 74 y.o. female with a history of a AAA, COPD, GERD, hypertension, hyperlipidemia, CHF, CKD, dementia who presents for evaluation of shortness of breath.  Patient has had couple of days of decreased appetite, increased generalized fatigue, and today started having shortness of breath.  She has a mild cough and nasal congestion.  She has not been vaccinated against Covid and denies any exposures.  No fever or chills, no vomiting or diarrhea.  Patient went to urgent care this evening and was found to have pneumonia and therefore sent to the hospital for further evaluation.  Patient denies any chest pain.   Past Medical History:  Diagnosis Date  . Abdominal aortic aneurysm (AAA) ([place])   . Arthritis   . COPD (chronic obstructive pulmonary disease) ([place])   . GERD (gastroesophageal reflux disease)   . Headache    sinus  . History of adenomatous polyp of colon   . Hyperlipidemia   . Hypertension   . Psoriasis   . Right lumbar radiculitis   . Vitamin D deficiency     Patient Active Problem List   Diagnosis Date Noted  . Left arm cellulitis 06/26/2020  . Hematoma of right hip 06/18/2020  . Congestive heart failure ([place]) 06/18/2020  . Weakness   . Closed nondisplaced fracture of distal phalanx of right little finger 06/06/2020  . Closed nondisplaced fracture of distal phalanx of right thumb 06/06/2020  . Protein-calorie malnutrition, severe 06/05/2020  . CAP (community acquired pneumonia) 06/03/2020  . Hypoalbuminemia 06/03/2020  . Cutaneous horn 06/03/2020  . Pleural effusion 06/03/2020  . Acute on  chronic diastolic CHF (congestive heart failure) ([place]) 06/03/2020  . Acute respiratory failure with hypoxia ([place]) 06/02/2020  . Irregular heartbeat 05/30/2020  . Head injury 05/30/2020  . Bilateral edema of lower extremity 05/30/2020  . Closed fracture of right hand 05/30/2020  . Blood transfusion reaction 05/30/2020  . Symptomatic anemia 05/26/2020  . CKD (chronic kidney disease), stage III ([place]) 05/26/2020  . Hematoma of right thigh 05/26/2020  . Finger fracture, right 05/26/2020  . Dyspnea 05/26/2020  . Acute deep vein thrombosis (DVT) of tibial vein of left lower extremity ([place]) 04/05/2020  . Mental confusion 04/01/2020  . Unintentional weight loss 03/01/2020  . Fall (on)(from) sidewalk curb, initial encounter 03/01/2020  . Memory loss 03/01/2020  . Rash of back 05/15/2019  . Allergic rhinitis 01/10/2018  . Cholelithiasis 03/16/2016  . Peripheral arterial disease ([place]) 02/05/2016  . 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 Abdominal aortic aneurysm (AAA) ([place]) 01/13/2016  . 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 Nerve root inflammation 10/25/2015  . Psoriatic arthritis ([place]) 10/07/2015  . 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 Right hip pain 08/12/2015  . Arthralgia of multiple joints 08/12/2015  . Age related osteoporosis 08/12/2015  . Chronic obstructive pulmonary disease ([place]) 06/24/2015  . Senile purpura ([place]) 06/24/2015  . Abnormal serum level of alkaline phosphatase 06/18/2015  . Elevated hemoglobin ([place]) 06/18/2015  . Lethargy 06/18/2015  . Fistula 06/18/2015  . Blood glucose elevated 06/18/2015  . Gastro-esophageal reflux disease without esophagitis 06/18/2015  . Restless leg 06/18/2015  . Avitaminosis D 06/18/2015  .  Mechanical and motor problems with internal organs 10/11/2009  . Hypercholesteremia 10/11/2009  . Arthritis, degenerative 09/25/2009  . Essential (primary) hypertension 09/25/2009  . Psoriasis 09/25/2009  . Compulsive tobacco user syndrome  09/25/2009    Past Surgical History:  Procedure Laterality Date  . APPENDECTOMY  2000  . BREAST BIOPSY Right 09/2016   radial scar;COMPLEX SCLEROSING LESION  . BREAST LUMPECTOMY Right 11/24/2016   COMPLEX SCLEROSING LESION  . BREAST LUMPECTOMY WITH NEEDLE LOCALIZATION Right 11/24/2016   Procedure: BREAST LUMPECTOMY WITH NEEDLE LOCALIZATION;  Surgeon: Leonie Green, MD;  Location: [facility] ORS;  Service: General;  Laterality: Right;  . COLONOSCOPY WITH PROPOFOL N/A 08/10/2016   Procedure: COLONOSCOPY WITH PROPOFOL;  Surgeon: Manya Silvas, MD;  Location: [facility] ENDOSCOPY;  Service: Endoscopy;  Laterality: N/A;  . ESOPHAGOGASTRODUODENOSCOPY (EGD) WITH PROPOFOL N/A 08/10/2016   Procedure: ESOPHAGOGASTRODUODENOSCOPY (EGD) WITH PROPOFOL;  Surgeon: Manya Silvas, MD;  Location: [facility] ENDOSCOPY;  Service: Endoscopy;  Laterality: N/A;  . FOOT SURGERY Bilateral   . JOINT REPLACEMENT Right 01/27/2016   hip  . 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 TOTAL HIP ARTHROPLASTY Right 01/27/2016   Procedure: TOTAL HIP ARTHROPLASTY;  Surgeon: Dereck Leep, MD;  Location: [facility] ORS;  Service: Orthopedics;  Laterality: Right;    Prior to Admission medications   Medication Sig Start Date End Date Taking? Authorizing Provider  acetaminophen (TYLENOL) 325 MG tablet Take 2 tablets (650 mg total) by mouth every 6 (six) hours as needed for mild pain (or Fever >/= 101). 06/06/20   Nita Sells, MD  albuterol (VENTOLIN HFA) 108 (90 Base) MCG/ACT inhaler INHALE 1-2 PUFFS INTO THE LUNGS EVERY 6 HOURS AS NEEDED FOR WHEEZING OR SHORTNESS OF BREATH. 05/22/20   Trinna Post, PA-C  albuterol (VENTOLIN HFA) 108 (90 Base) MCG/ACT inhaler Inhale 2 puffs into the lungs every 6 (six) hours as needed for wheezing or shortness of breath. 07/03/20   Nena Polio, MD  apixaban (ELIQUIS) 5 MG TABS tablet Take 1 tablet (5 mg total) by mouth 2 (two) times daily. 06/09/20   Nita Sells, MD  ascorbic acid (VITAMIN C)  250 MG tablet Take 1 tablet (250 mg total) by mouth 2 (two) times daily. 06/06/20   Nita Sells, MD  aspirin EC 81 MG tablet Take 81 mg by mouth daily. Swallow whole.    [provider]  betamethasone dipropionate (DIPROLENE) 0.05 % ointment Apply topically daily. 01/01/20   Ralene Bathe, MD  calcipotriene (DOVONOX) 0.005 % cream APPLY TO SKIN ONCE A DAY APPLY DAILY ON HANDS, FEET AND WRIST X 2 WEEKS, THEN USE 5 DAYS A WEEK 01/01/20   Ralene Bathe, MD  cephALEXin (KEFLEX) 500 MG capsule Take 1 capsule (500 mg total) by mouth 2 (two) times daily. 06/26/20   Flinchum, Kelby Aline, FNP  Cholecalciferol (VITAMIN D3) 2000 units TABS Take 2,000 Units by mouth daily.    [provider]  clobetasol (TEMOVATE) 0.05 % external solution APPLY TO AFFECTED AREA TWICE A DAY 12/03/19   Bacigalupo, Dionne Bucy, MD  COSENTYX SENSOREADY, 300 MG, 150 MG/ML SOAJ Inject 2 Syringes into the skin every 28 (twenty-eight) days. 05/31/20   [provider]  donepezil (ARICEPT) 5 MG tablet Take 5 mg by mouth at bedtime.    [provider]  doxycycline (VIBRA-TABS) 100 MG tablet Take 1 tablet (100 mg total) by mouth 2 (two) times daily. 06/26/20   Flinchum, Kelby Aline, FNP  ferrous [id number] C-folic acid (TRINSICON /  FOLTRIN) capsule Take 1 capsule by mouth 2 (two) times daily after a meal. 05/28/20   Lorella Nimrod, MD  ferrous sulfate 325 (65 FE) MG tablet Take 325 mg by mouth 2 (two) times daily. 05/28/20   [provider]  fluticasone furoate-vilanterol (BREO ELLIPTA) 100-25 MCG/INH AEPB Inhale 1 puff into the lungs daily. 05/22/20   Trinna Post, PA-C  furosemide (LASIX) 40 MG tablet Take 1 tablet (40 mg total) by mouth daily. If weight 127 lb or less, take 20 mg by mouth daily. 07/01/20   Minna Merritts, MD  gabapentin (NEURONTIN) 300 MG capsule TAKE 1 CAPSULE BY MOUTH TWICE A DAY 05/07/20   Bacigalupo, Dionne Bucy, MD  guaiFENesin (MUCINEX) 600 MG 12 hr  tablet Take 1 tablet (600 mg total) by mouth 2 (two) times daily. 06/06/20   Nita Sells, MD  levocetirizine (XYZAL) 5 MG tablet TAKE 1 TABLET BY MOUTH EVERY DAY IN THE EVENING 12/03/19   Bacigalupo, Dionne Bucy, MD  methotrexate (RHEUMATREX) 2.5 MG tablet Take 15 mg by mouth once a week.  05/23/20   [provider]  metoprolol succinate (TOPROL-XL) 50 MG 24 hr tablet TAKE 1 TABLET (50 MG TOTAL) BY MOUTH DAILY. TAKE WITH OR IMMEDIATELY FOLLOWING A MEAL. 06/27/20   Bacigalupo, Dionne Bucy, MD  nicotine (NICODERM CQ - DOSED IN MG/24 HOURS) 21 mg/24hr patch Place 1 patch (21 mg total) onto the skin daily. 06/24/20   Minna Merritts, MD  pantoprazole (PROTONIX) 20 MG tablet TAKE 1 TABLET (20 MG TOTAL) BY MOUTH 2 (TWO) TIMES DAILY BEFORE A MEAL. 06/26/20   Bacigalupo, Dionne Bucy, MD  potassium chloride SA (KLOR-CON) 20 MEQ tablet Take 2 tablets (40 mEq total) by mouth daily. 06/06/20   Nita Sells, MD  predniSONE (DELTASONE) 20 MG tablet Take 1 tablet (20 mg total) by mouth daily. 07/03/20 07/03/21  Nena Polio, MD  rOPINIRole (REQUIP) 1 MG tablet TAKE 1 TABLET (1 MG TOTAL) BY MOUTH AT BEDTIME. 06/26/20   Bacigalupo, Dionne Bucy, MD  simvastatin (ZOCOR) 20 MG tablet TAKE 1 TABLET BY MOUTH EVERY DAY 10/01/19   Bacigalupo, Dionne Bucy, MD  traMADol (ULTRAM) 50 MG tablet TAKE 1 TABLET BY MOUTH EVERY 6 HOURS AS NEEDED FOR MODERATE OR SEVERE PAIN 07/05/20   [name], MD    Allergies Clindamycin/lincomycin and Amoxicillin  Family History  Problem Relation Age of Onset  . Cancer Mother        kidney  . Arthritis Father   . Heart disease Father   . COPD Sister   . Fibromyalgia Sister   . Osteoarthritis Sister   . Osteoarthritis Maternal Aunt   . Colon cancer Paternal Aunt   . Colon cancer Paternal Uncle   . Colon cancer Paternal Grandmother   . Breast cancer Neg Hx     Social History Social History   Tobacco Use  . Smoking status: Current Every Day Smoker    Packs/day:  1.50    Years: 40.00    Pack years: 60.00  . Smokeless tobacco: Never Used  . Tobacco comment: 0.5 PPD 06/19/2020  Vaping Use  . Vaping Use: Some days  Substance Use Topics  . Alcohol use: Yes    Comment: occasionally glass of wine  . Drug use: No    Review of Systems  Constitutional: Negative for fever. + Decreased appetite, fatigue Eyes: Negative for visual changes. ENT: Negative for sore throat. Neck: No neck pain  Cardiovascular: Negative for chest pain. Respiratory: +  shortness of breath and cough Gastrointestinal: Negative for abdominal pain, vomiting or diarrhea. Genitourinary: Negative for dysuria. Musculoskeletal: Negative for back pain. Skin: Negative for rash. Neurological: Negative for headaches, weakness or numbness. Psych: No SI or HI  ____________________________________________   PHYSICAL EXAM:  VITAL SIGNS: ED Triage Vitals [07/08/20 1909]  Enc Vitals Group     BP 105/64     Pulse Rate 77     Resp 18     Temp 98.8 F (37.1 C)     Temp Source Oral     SpO2 94 %     Weight      Height      Head Circumference      Peak Flow      Pain Score 0     Pain Loc      Pain Edu?      Excl. in [place]?     Constitutional: Alert and oriented. Well appearing and in no apparent distress. HEENT:      Head: Normocephalic and atraumatic.         Eyes: Conjunctivae are normal. Sclera is non-icteric.       Mouth/Throat: Mucous membranes are moist.       Neck: Supple with no signs of meningismus. Cardiovascular: Regular rate and rhythm. No murmurs, gallops, or rubs. 2+ symmetrical distal pulses are present in all extremities. No JVD. Respiratory: Normal respiratory effort.  Normal sats with decreased air movement bilaterally.  Patient does become hypoxic to 86% with ambulation Gastrointestinal: Soft, non tender. Musculoskeletal: No edema, cyanosis, or erythema of extremities. Neurologic: Normal speech and language. Face is symmetric. Moving all extremities. No  gross focal neurologic deficits are appreciated. Skin: Skin is warm, dry and intact. No rash noted. Psychiatric: Mood and affect are normal. Speech and behavior are normal.  ____________________________________________   LABS (all labs ordered are listed, but only abnormal results are displayed)  Labs Reviewed  CULTURE, BLOOD (ROUTINE X 2)  CULTURE, BLOOD (ROUTINE X 2)  PROCALCITONIN  LACTIC ACID, PLASMA  LACTIC ACID, PLASMA   ____________________________________________  EKG  ED ECG REPORT I, Rudene Re, the attending physician, personally viewed and interpreted this ECG.  MAT, rate 95, normal intervals and normal axis with no ST elevation. No significant changes when compared to prior. ____________________________________________  RADIOLOGY  I have personally reviewed the images performed during this visit and I agree with the Radiologist's read.   Interpretation by Radiologist:  DG Chest 2 View  Result Date: 07/08/2020 CLINICAL DATA:  Shortness of breath EXAM: CHEST - 2 VIEW COMPARISON:  July 03, 2020 FINDINGS: There is airspace opacity consistent with pneumonia in the right base and to a lesser extent in the right mid lung. Lungs elsewhere are clear. Heart size and pulmonary vascularity are normal. No adenopathy. There is aortic atherosclerosis. There is degenerative change in the thoracic spine. IMPRESSION: Airspace opacity right base anteriorly and to a lesser extent right mid lung. Appearance indicative of multifocal pneumonia. Advise check of COVID-19 status given this appearance. Lungs elsewhere clear. Heart size normal. No adenopathy. Aortic Atherosclerosis (ICD10-I70.0). These results will be called to the ordering clinician or representative by the Radiologist Assistant, and communication documented in the PACS or [employer]. Electronically Signed   By: Lowella Grip III M.D.   On: 07/08/2020 16:07      ____________________________________________   PROCEDURES  Procedure(s) performed:yes .1-3 Lead EKG Interpretation Performed by: Rudene Re, MD Authorized by: Rudene Re, MD     Interpretation: abnormal  ECG rate assessment: tachycardic     Rhythm comment:  Multifocal atrial tachycardia   Ectopy: PAC     Critical Care performed: yes  CRITICAL CARE Performed by: Rudene Re  ?  Total critical care time: 35 min  Critical care time was exclusive of separately billable procedures and treating other patients.  Critical care was necessary to treat or prevent imminent or life-threatening deterioration.  Critical care was time spent personally by me on the following activities: development of treatment plan with patient and/or surrogate as well as nursing, discussions with consultants, evaluation of patient's response to treatment, examination of patient, obtaining history from patient or surrogate, ordering and performing treatments and interventions, ordering and review of laboratory studies, ordering and review of radiographic studies, pulse oximetry and re-evaluation of patient's condition.  ____________________________________________   INITIAL IMPRESSION / ASSESSMENT AND PLAN / ED COURSE   74 y.o. female with a history of a AAA, COPD, GERD, hypertension, hyperlipidemia, CHF, CKD, dementia who presents for evaluation of shortness of breath, cough, fatigue, decreased PO intake, hypoxia sent from UC for PNA.  Medical records from urgent care visit this evening were reviewed showing negative Covid and flu, chest x-ray consistent with multifocal pneumonia and mild leukopenia which patient has had in the past.  She has normal work of breathing and normal sats however when ambulated she drops her sats to 86%.  Her sats recover without oxygen just by resting.  No signs of sepsis with no fever or tachycardia.  Will treat with duo nebs, Solu-Medrol, will cover  broadly from [facility] due to recent hospitalization with cefepime and Vanco and discuss with hospitalist for admission.  Patient placed on telemetry for close monitoring.  _________________________ 2:32 AM on 07/09/2020 -----------------------------------------  Patient's niece is now at bedside.  Patient lives with her.  According to the niece, patient is currently on azithromycin, doxycycline, and Augmentin at home.  She has failed outpatient therapy.     _____________________________________________ Please note:  Patient was evaluated in Emergency Department today for the symptoms described in the history of present illness. Patient was evaluated in the context of the global COVID-19 pandemic, which necessitated consideration that the patient might be at risk for infection with the SARS-CoV-2 virus that causes COVID-19. Institutional protocols and algorithms that pertain to the evaluation of patients at risk for COVID-19 are in a state of rapid change based on information released by regulatory bodies including the CDC and federal and state organizations. These policies and algorithms were followed during the patient's care in the ED.  Some ED evaluations and interventions may be delayed as a result of limited staffing during the pandemic.   [place] Controlled Substance Database was reviewed by me. ____________________________________________   FINAL CLINICAL IMPRESSION(S) / ED DIAGNOSES   Final diagnoses:  Acute respiratory failure with hypoxia ([place])  Healthcare-associated pneumonia      NEW MEDICATIONS STARTED DURING THIS VISIT:  ED Discharge Orders    None       Note:  This document was prepared using Dragon voice recognition software and may include unintentional dictation errors.    Alfred Levins, [place], MD 07/09/20 [place], [place], [place] 07/09/20 [phone]

## 2020-07-09 NOTE — H&P (Signed)
History and Physical    KYRSTAL BEBEE [id number] DOB: 11-06-1945 DOA: 07/09/2020  PCP: [name], MD   Patient coming from: Home   Chief Complaint: SOB, cough, sinus congestion   HPI: TENAY MALZ is a 74 y.o. female with medical history significant for psoriatic arthritis, COPD, nocturnal oxygen requirement, chronic diastolic CHF, CKD, memory loss, history of DVT on Eliquis, and recent treatment with steroids and antibiotics for COPD exacerbation, now presenting to the emergency department with progressive shortness of breath, hypoxia at home, cough, and sinus congestion.  Patient was treated 1 week ago with azithromycin and prednisone for COPD exacerbation, was also on doxycycline and Keflex recently for skin infection, has had worsening shortness of breath and cough over the past couple days, has also had sinus congestion, and was noted to have oxygen saturations in the low 80s at home despite using 3 L/min supplemental oxygen.  She denies any chest pain, leg swelling, or calf tenderness.  ED Course: Upon arrival to the ED, patient is found to be afebrile, saturating low 90s on room air while at rest, tachypneic, and with stable blood pressure.  EKG features multifocal atrial tachycardia with rate 122.  Chest x-ray notable for airspace opacities at the right base and right midlung concerning for multifocal pneumonia.  Chemistry panel is unremarkable.  CBC notable for WBC 2000, hemoglobin 10.2, and platelets 112,000.  Lactic acid is normal, procalcitonin undetectable, and Covid and influenza PCR negative.  Blood cultures were collected and the patient was treated with IV steroids, vancomycin, cefepime, and multiple nebs.  Review of Systems:  All other systems reviewed and apart from HPI, are negative.  Past Medical History:  Diagnosis Date  . Abdominal aortic aneurysm (AAA) ([place])   . Arthritis   . COPD (chronic obstructive pulmonary disease) ([place])   . GERD (gastroesophageal  reflux disease)   . Headache    sinus  . History of adenomatous polyp of colon   . Hyperlipidemia   . Hypertension   . Psoriasis   . Right lumbar radiculitis   . Vitamin D deficiency     Past Surgical History:  Procedure Laterality Date  . APPENDECTOMY  2000  . BREAST BIOPSY Right 09/2016   radial scar;COMPLEX SCLEROSING LESION  . BREAST LUMPECTOMY Right 11/24/2016   COMPLEX SCLEROSING LESION  . BREAST LUMPECTOMY WITH NEEDLE LOCALIZATION Right 11/24/2016   Procedure: BREAST LUMPECTOMY WITH NEEDLE LOCALIZATION;  Surgeon: Leonie Green, MD;  Location: [facility] ORS;  Service: General;  Laterality: Right;  . COLONOSCOPY WITH PROPOFOL N/A 08/10/2016   Procedure: COLONOSCOPY WITH PROPOFOL;  Surgeon: Manya Silvas, MD;  Location: [facility] ENDOSCOPY;  Service: Endoscopy;  Laterality: N/A;  . ESOPHAGOGASTRODUODENOSCOPY (EGD) WITH PROPOFOL N/A 08/10/2016   Procedure: ESOPHAGOGASTRODUODENOSCOPY (EGD) WITH PROPOFOL;  Surgeon: Manya Silvas, MD;  Location: [facility] ENDOSCOPY;  Service: Endoscopy;  Laterality: N/A;  . FOOT SURGERY Bilateral   . JOINT REPLACEMENT Right 01/27/2016   hip  . 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 TOTAL HIP ARTHROPLASTY Right 01/27/2016   Procedure: TOTAL HIP ARTHROPLASTY;  Surgeon: Dereck Leep, MD;  Location: [facility] ORS;  Service: Orthopedics;  Laterality: Right;    Social History:   reports that she has been smoking. She has a 60.00 pack-year smoking history. She has never used smokeless tobacco. She reports current alcohol use. She reports that she does not use drugs.  Allergies  Allergen Reactions  . Clindamycin/Lincomycin     diarrhea  . Amoxicillin  Diarrhea    Family History  Problem Relation Age of Onset  . Cancer Mother        kidney  . Arthritis Father   . Heart disease Father   . COPD Sister   . Fibromyalgia Sister   . Osteoarthritis Sister   . Osteoarthritis Maternal Aunt   . Colon cancer Paternal Aunt   . Colon cancer Paternal Uncle   . Colon  cancer Paternal Grandmother   . Breast cancer Neg Hx      Prior to Admission medications   Medication Sig Start Date End Date Taking? Authorizing Provider  acetaminophen (TYLENOL) 325 MG tablet Take 2 tablets (650 mg total) by mouth every 6 (six) hours as needed for mild pain (or Fever >/= 101). 06/06/20  Yes Nita Sells, MD  albuterol (VENTOLIN HFA) 108 (90 Base) MCG/ACT inhaler INHALE 1-2 PUFFS INTO THE LUNGS EVERY 6 HOURS AS NEEDED FOR WHEEZING OR SHORTNESS OF BREATH. 05/22/20  Yes Pollak, Adriana M, PA-C  apixaban (ELIQUIS) 5 MG TABS tablet Take 1 tablet (5 mg total) by mouth 2 (two) times daily. 06/09/20  Yes Nita Sells, MD  ascorbic acid (VITAMIN C) 250 MG tablet Take 1 tablet (250 mg total) by mouth 2 (two) times daily. 06/06/20  Yes Nita Sells, MD  aspirin EC 81 MG tablet Take 81 mg by mouth daily. Swallow whole.   Yes [provider]  betamethasone dipropionate (DIPROLENE) 0.05 % ointment Apply topically daily. 01/01/20  Yes Ralene Bathe, MD  calcipotriene (DOVONOX) 0.005 % cream APPLY TO SKIN ONCE A DAY APPLY DAILY ON HANDS, FEET AND WRIST X 2 WEEKS, THEN USE 5 DAYS A WEEK 01/01/20  Yes Ralene Bathe, MD  cephALEXin (KEFLEX) 500 MG capsule Take 1 capsule (500 mg total) by mouth 2 (two) times daily. 06/26/20  Yes Flinchum, Kelby Aline, FNP  Cholecalciferol (VITAMIN D3) 2000 units TABS Take 2,000 Units by mouth daily.   Yes [provider]  clobetasol (TEMOVATE) 0.05 % external solution APPLY TO AFFECTED AREA TWICE A DAY 12/03/19  Yes Bacigalupo, Dionne Bucy, MD  COSENTYX SENSOREADY, 300 MG, 150 MG/ML SOAJ Inject 2 Syringes into the skin every 28 (twenty-eight) days. 05/31/20  Yes [provider]  donepezil (ARICEPT) 10 MG tablet Take 10 mg by mouth daily. 07/01/20  Yes [provider]  doxycycline (VIBRA-TABS) 100 MG tablet Take 1 tablet (100 mg total) by mouth 2 (two) times daily. 06/26/20  Yes Flinchum, Kelby Aline, FNP   ferrous [id number] C-folic acid (TRINSICON / FOLTRIN) capsule Take 1 capsule by mouth 2 (two) times daily after a meal. 05/28/20  Yes Lorella Nimrod, MD  ferrous sulfate 325 (65 FE) MG tablet Take 325 mg by mouth 2 (two) times daily. 05/28/20  Yes [provider]  fluticasone furoate-vilanterol (BREO ELLIPTA) 100-25 MCG/INH AEPB Inhale 1 puff into the lungs daily. 05/22/20  Yes Trinna Post, PA-C  furosemide (LASIX) 40 MG tablet Take 1 tablet (40 mg total) by mouth daily. If weight 127 lb or less, take 20 mg by mouth daily. 07/01/20  Yes Gollan, Kathlene November, MD  gabapentin (NEURONTIN) 300 MG capsule TAKE 1 CAPSULE BY MOUTH TWICE A DAY 05/07/20  Yes Bacigalupo, Dionne Bucy, MD  guaiFENesin (MUCINEX) 600 MG 12 hr tablet Take 1 tablet (600 mg total) by mouth 2 (two) times daily. 06/06/20  Yes Nita Sells, MD  levocetirizine (XYZAL) 5 MG tablet TAKE 1 TABLET BY MOUTH EVERY DAY IN THE EVENING 12/03/19  Yes Bacigalupo, Levada Dy  M, MD  methotrexate (RHEUMATREX) 2.5 MG tablet Take 15 mg by mouth once a week.  05/23/20  Yes [provider]  metoprolol succinate (TOPROL-XL) 50 MG 24 hr tablet TAKE 1 TABLET (50 MG TOTAL) BY MOUTH DAILY. TAKE WITH OR IMMEDIATELY FOLLOWING A MEAL. 06/27/20  Yes Bacigalupo, Dionne Bucy, MD  nicotine (NICODERM CQ - DOSED IN MG/24 HOURS) 21 mg/24hr patch Place 1 patch (21 mg total) onto the skin daily. 06/24/20  Yes Gollan, Kathlene November, MD  pantoprazole (PROTONIX) 20 MG tablet TAKE 1 TABLET (20 MG TOTAL) BY MOUTH 2 (TWO) TIMES DAILY BEFORE A MEAL. 06/26/20  Yes Bacigalupo, Dionne Bucy, MD  potassium chloride SA (KLOR-CON) 20 MEQ tablet Take 2 tablets (40 mEq total) by mouth daily. 06/06/20  Yes Nita Sells, MD  predniSONE (DELTASONE) 20 MG tablet Take 1 tablet (20 mg total) by mouth daily. 07/03/20 07/03/21 Yes Nena Polio, MD  rOPINIRole (REQUIP) 1 MG tablet TAKE 1 TABLET (1 MG TOTAL) BY MOUTH AT BEDTIME. 06/26/20  Yes Bacigalupo, Dionne Bucy, MD   simvastatin (ZOCOR) 20 MG tablet TAKE 1 TABLET BY MOUTH EVERY DAY 10/01/19  Yes Bacigalupo, Dionne Bucy, MD  traMADol (ULTRAM) 50 MG tablet TAKE 1 TABLET BY MOUTH EVERY 6 HOURS AS NEEDED FOR MODERATE OR SEVERE PAIN 07/05/20  Yes [name], MD    Physical Exam: Vitals:   07/09/20 0430 07/09/20 0500 07/09/20 0530 07/09/20 0600  BP: (!) 136/58 118/74 104/64 133/66  Pulse: (!) 103 (!) 124 (!) 102 86  Resp: (!) 23 (!) 24 20 (!) 25  Temp:      TempSrc:      SpO2: 91% 94% 95% 95%    Constitutional: NAD, calm  Eyes: PERTLA, lids and conjunctivae normal ENMT: Mucous membranes are moist. Posterior pharynx clear of any exudate or lesions.   Neck: normal, supple, no masses, no thyromegaly Respiratory: Coarse rhonchi, prolonged expiratory phase, wheezes. No pallor or cyanosis.  Cardiovascular: S1 & S2 heard, regular rate and rhythm. No extremity edema.  Abdomen: No distension, no tenderness, soft. Bowel sounds active.  Musculoskeletal: no clubbing / cyanosis. No joint deformity upper and lower extremities.   Skin: no significant rashes, lesions, ulcers. Warm, dry, well-perfused. Neurologic: CN 2-12 grossly intact. Sensation intact. Moving all extremities.  Psychiatric: Alert and oriented to person, place, and situation. Very pleasant and cooperative.    Labs and Imaging on Admission: I have personally reviewed following labs and imaging studies  CBC: Recent Labs  Lab 07/03/20 1733 07/08/20 1626  WBC 3.2* 2.0*  NEUTROABS  --  1.4*  HGB 11.0* 10.2*  HCT 33.4* 29.5*  MCV 97.1 93.1  PLT 128* [id number]*   Basic Metabolic Panel: Recent Labs  Lab 07/03/20 1733 07/08/20 1626  NA 138 135  K 3.9 3.5  CL 97* 97*  CO2 30 29  GLUCOSE 96 116*  BUN 19 16  CREATININE 1.08* 0.91  CALCIUM 8.5* 8.5*   GFR: Estimated Creatinine Clearance: 43.8 mL/min (by C-G formula based on SCr of 0.91 mg/dL). Liver Function Tests: Recent Labs  Lab 07/08/20 1626  AST 18  ALT 14  ALKPHOS 95   BILITOT 0.7  PROT 6.2*  ALBUMIN 3.3*   No results for input(s): LIPASE, AMYLASE in the last 168 hours. No results for input(s): AMMONIA in the last 168 hours. Coagulation Profile: No results for input(s): INR, PROTIME in the last 168 hours. Cardiac Enzymes: No results for input(s): CKTOTAL, CKMB, CKMBINDEX, TROPONINI in the last 168 hours. BNP (  last 3 results) No results for input(s): PROBNP in the last 8760 hours. HbA1C: No results for input(s): HGBA1C in the last 72 hours. CBG: No results for input(s): GLUCAP in the last 168 hours. Lipid Profile: No results for input(s): CHOL, HDL, LDLCALC, TRIG, CHOLHDL, LDLDIRECT in the last 72 hours. Thyroid Function Tests: No results for input(s): TSH, T4TOTAL, FREET4, T3FREE, THYROIDAB in the last 72 hours. Anemia Panel: No results for input(s): VITAMINB12, FOLATE, FERRITIN, TIBC, IRON, RETICCTPCT in the last 72 hours. Urine analysis:    Component Value Date/Time   COLORURINE STRAW (A) 01/15/2016 1329   APPEARANCEUR CLEAR (A) 01/15/2016 1329   LABSPEC 1.002 (L) 01/15/2016 1329   PHURINE 7.0 01/15/2016 1329   GLUCOSEU NEGATIVE 01/15/2016 1329   HGBUR NEGATIVE 01/15/2016 1329   BILIRUBINUR Negative 03/04/2020 0931   KETONESUR NEGATIVE 01/15/2016 1329   PROTEINUR Negative 03/04/2020 0931   PROTEINUR NEGATIVE 01/15/2016 1329   UROBILINOGEN 0.2 03/04/2020 0931   NITRITE Negative 03/04/2020 0931   NITRITE NEGATIVE 01/15/2016 1329   LEUKOCYTESUR Negative 03/04/2020 0931   Sepsis Labs: @LABRCNTIP (procalcitonin:4,lacticidven:4) ) Recent Results (from the past 240 hour(s))  Resp Panel by RT-PCR (Flu A&B, Covid) Nasopharyngeal Swab     Status: None   Collection Time: 07/08/20  3:38 PM   Specimen: Nasopharyngeal Swab; Nasopharyngeal(NP) swabs in vial transport medium  Result Value Ref Range Status   SARS Coronavirus 2 by RT PCR NEGATIVE NEGATIVE Final    Comment: (NOTE) SARS-CoV-2 target nucleic acids are NOT DETECTED.  The SARS-CoV-2  RNA is generally detectable in upper respiratory specimens during the acute phase of infection. The lowest concentration of SARS-CoV-2 viral copies this assay can detect is 138 copies/mL. A negative result does not preclude SARS-Cov-2 infection and should not be used as the sole basis for treatment or other patient management decisions. A negative result may occur with  improper specimen collection/handling, submission of specimen other than nasopharyngeal swab, presence of viral mutation(s) within the areas targeted by this assay, and inadequate number of viral copies(<138 copies/mL). A negative result must be combined with clinical observations, patient history, and epidemiological information. The expected result is Negative.  Fact Sheet for Patients:  EntrepreneurPulse.com.au  Fact Sheet for Healthcare Providers:  IncredibleEmployment.be  This test is no t yet approved or cleared by the [place] FDA and  has been authorized for detection and/or diagnosis of SARS-CoV-2 by FDA under an Emergency Use Authorization (EUA). This EUA will remain  in effect (meaning this test can be used) for the duration of the COVID-19 declaration under Section 564(b)(1) of the Act, 21 U.S.C.section 360bbb-3(b)(1), unless the authorization is terminated  or revoked sooner.       Influenza A by PCR NEGATIVE NEGATIVE Final   Influenza B by PCR NEGATIVE NEGATIVE Final    Comment: (NOTE) The Xpert Xpress SARS-CoV-2/FLU/RSV plus assay is intended as an aid in the diagnosis of influenza from Nasopharyngeal swab specimens and should not be used as a sole basis for treatment. Nasal washings and aspirates are unacceptable for Xpert Xpress SARS-CoV-2/FLU/RSV testing.  Fact Sheet for Patients: EntrepreneurPulse.com.au  Fact Sheet for Healthcare Providers: IncredibleEmployment.be  This test is not yet approved or cleared by the  [place] FDA and has been authorized for detection and/or diagnosis of SARS-CoV-2 by FDA under an Emergency Use Authorization (EUA). This EUA will remain in effect (meaning this test can be used) for the duration of the COVID-19 declaration under Section 564(b)(1) of the Act, 21 U.S.C. section 360bbb-3(b)(1), unless  the authorization is terminated or revoked.  Performed at [facility], [address] [place], [place] [address]   Blood culture (routine x 2)     Status: None (Preliminary result)   Collection Time: 07/09/20  2:08 AM   Specimen: BLOOD  Result Value Ref Range Status   Specimen Description BLOOD LEFT ANTECUBITAL  Final   Special Requests   Final    BOTTLES DRAWN AEROBIC AND ANAEROBIC Blood Culture results may not be optimal due to an excessive volume of blood received in culture bottles   Culture   Final    NO GROWTH < 12 HOURS Performed at [facility], [address], [place], [place] [address]    Report Status PENDING  Incomplete  Blood culture (routine x 2)     Status: None (Preliminary result)   Collection Time: 07/09/20  2:08 AM   Specimen: BLOOD  Result Value Ref Range Status   Specimen Description BLOOD RIGHT ANTECUBITAL  Final   Special Requests   Final    BOTTLES DRAWN AEROBIC AND ANAEROBIC Blood Culture adequate volume   Culture   Final    NO GROWTH < 12 HOURS Performed at [facility], [address], [place], [place] [address]    Report Status PENDING  Incomplete  Resp Panel by RT-PCR (Flu A&B, Covid) Nasopharyngeal Swab     Status: None   Collection Time: 07/09/20  3:07 AM   Specimen: Nasopharyngeal Swab; Nasopharyngeal(NP) swabs in vial transport medium  Result Value Ref Range Status   SARS Coronavirus 2 by RT PCR NEGATIVE NEGATIVE Final    Comment: (NOTE) SARS-CoV-2 target nucleic acids are NOT DETECTED.  The SARS-CoV-2 RNA is generally detectable in upper respiratory specimens during the acute phase of  infection. The lowest concentration of SARS-CoV-2 viral copies this assay can detect is 138 copies/mL. A negative result does not preclude SARS-Cov-2 infection and should not be used as the sole basis for treatment or other patient management decisions. A negative result may occur with  improper specimen collection/handling, submission of specimen other than nasopharyngeal swab, presence of viral mutation(s) within the areas targeted by this assay, and inadequate number of viral copies(<138 copies/mL). A negative result must be combined with clinical observations, patient history, and epidemiological information. The expected result is Negative.  Fact Sheet for Patients:  EntrepreneurPulse.com.au  Fact Sheet for Healthcare Providers:  IncredibleEmployment.be  This test is no t yet approved or cleared by the [place] FDA and  has been authorized for detection and/or diagnosis of SARS-CoV-2 by FDA under an Emergency Use Authorization (EUA). This EUA will remain  in effect (meaning this test can be used) for the duration of the COVID-19 declaration under Section 564(b)(1) of the Act, 21 U.S.C.section 360bbb-3(b)(1), unless the authorization is terminated  or revoked sooner.       Influenza A by PCR NEGATIVE NEGATIVE Final   Influenza B by PCR NEGATIVE NEGATIVE Final    Comment: (NOTE) The Xpert Xpress SARS-CoV-2/FLU/RSV plus assay is intended as an aid in the diagnosis of influenza from Nasopharyngeal swab specimens and should not be used as a sole basis for treatment. Nasal washings and aspirates are unacceptable for Xpert Xpress SARS-CoV-2/FLU/RSV testing.  Fact Sheet for Patients: EntrepreneurPulse.com.au  Fact Sheet for Healthcare Providers: IncredibleEmployment.be  This test is not yet approved or cleared by the [place] FDA and has been authorized for detection and/or diagnosis of SARS-CoV-2  by FDA under an Emergency Use Authorization (EUA). This  EUA will remain in effect (meaning this test can be used) for the duration of the COVID-19 declaration under Section 564(b)(1) of the Act, 21 U.S.C. section 360bbb-3(b)(1), unless the authorization is terminated or revoked.  Performed at [facility], [address] [place], [place] [address]      Radiological Exams on Admission: DG Chest 2 View  Result Date: 07/08/2020 CLINICAL DATA:  Shortness of breath EXAM: CHEST - 2 VIEW COMPARISON:  July 03, 2020 FINDINGS: There is airspace opacity consistent with pneumonia in the right base and to a lesser extent in the right mid lung. Lungs elsewhere are clear. Heart size and pulmonary vascularity are normal. No adenopathy. There is aortic atherosclerosis. There is degenerative change in the thoracic spine. IMPRESSION: Airspace opacity right base anteriorly and to a lesser extent right mid lung. Appearance indicative of multifocal pneumonia. Advise check of COVID-19 status given this appearance. Lungs elsewhere clear. Heart size normal. No adenopathy. Aortic Atherosclerosis (ICD10-I70.0). These results will be called to the ordering clinician or representative by the Radiologist Assistant, and communication documented in the PACS or [employer]. Electronically Signed   By: Lowella Grip III M.D.   On: 07/08/2020 16:07    EKG: Independently reviewed. Multifocal atrial tachycardia.   Assessment/Plan   1. Pneumonia; COPD with acute exacerbation  - Presents with worsening SOB and hypoxia at home despite recent treatment with antibiotics and prednisone and is found to have CXR findings concerning for multifocal PNA, tachypnea at rest, and leukopenia  - Procalcitonin is undetectable and CXR findings and symptoms suggest viral pneumonia; she is negative for COVID and flu twice in 2 days  - Blood cultures were collected in ED and she was treated with 125 mg IV Solu-Medrol,  cefepime, vancomycin, and Duonebs  - Check sputum culture, check strep pneumo and legionella antigens, check respiratory virus panel, continue systemic steroids, continue antibiotics with Rocephin and azithromycin, continue ICS/LABA and prn SABA, and continue supplemental O2 as needed   2. Pancytopenia  - Hgb appears stable but WBC and platelets are decreased  - ANC is 1340, ALC 480 and platelets 112k on admission  - Likely due to infection and/or methotrexate  - Continue eval and treatment of pneumonia as above, hold methotrexate, monitor   3. Multifocal atrial tachycardia  - Patient developed tachycardia to 130s in ED, EKG consistent with multifocal atrial tachycardia  - Likely precipitated by her pulmonary disease  - Continue metoprolol, check mag level, supplement potassium    4. Chronic diastolic CHF  - Appears compensated  - EF was normal in November 2021  - Continue Lasix, monitor weight    5. Psoriais; psoriatic arthritis  - Hold methotrexate while monitoring blood counts, continue topicals   6. History of DVT  - No evidence for acute VTE, continue Eliquis    7. CKD stage II  - SCr is 0.91 on admission, consistent with her apparent baseline  - Renally-dose medications, monitor   8. Memory loss  - Continue Aricept    9. PAD  - Continue ASA and statin, encourage smoking cessation   10. Chronic pain - No pain complaints on admission  - Continue home medications    DVT prophylaxis: Eliquis  Code Status: Full  Family Communication: Discussed with patient  Disposition Plan:  Patient is from: home  Anticipated d/c is to: TBD Anticipated d/c date is: 07/12/20 Patient currently: Pending improvement in respiratory status, repeat CBC Consults called: None  Admission status: Inpatient  Vianne Bulls, MD [facility]  07/09/2020, 6:41 AM

## 2020-07-09 NOTE — ED Notes (Signed)
Pt assisted to the bathroom and provided meal tray

## 2020-07-10 LAB — CBC WITH DIFFERENTIAL/PLATELET
Abs Immature Granulocytes: 0.01 10*3/uL (ref 0.00–0.07)
Basophils Absolute: 0 10*3/uL (ref 0.0–0.1)
Basophils Relative: 0 %
Eosinophils Absolute: 0 10*3/uL (ref 0.0–0.5)
Eosinophils Relative: 0 %
HCT: 24.7 % — ABNORMAL LOW (ref 36.0–46.0)
Hemoglobin: 8.7 g/dL — ABNORMAL LOW (ref 12.0–15.0)
Immature Granulocytes: 0 %
Lymphocytes Relative: 9 %
Lymphs Abs: 0.3 10*3/uL — ABNORMAL LOW (ref 0.7–4.0)
MCH: 32.7 pg (ref 26.0–34.0)
MCHC: 35.2 g/dL (ref 30.0–36.0)
MCV: 92.9 fL (ref 80.0–100.0)
Monocytes Absolute: 0.2 10*3/uL (ref 0.1–1.0)
Monocytes Relative: 5 %
Neutro Abs: 3 10*3/uL (ref 1.7–7.7)
Neutrophils Relative %: 86 %
Platelets: 93 10*3/uL — ABNORMAL LOW (ref 150–400)
RBC: 2.66 MIL/uL — ABNORMAL LOW (ref 3.87–5.11)
RDW: 17.1 % — ABNORMAL HIGH (ref 11.5–15.5)
WBC: 3.5 10*3/uL — ABNORMAL LOW (ref 4.0–10.5)
nRBC: 0 % (ref 0.0–0.2)

## 2020-07-10 LAB — BASIC METABOLIC PANEL
Anion gap: 10 (ref 5–15)
BUN: 21 mg/dL (ref 8–23)
CO2: 25 mmol/L (ref 22–32)
Calcium: 8.9 mg/dL (ref 8.9–10.3)
Chloride: 102 mmol/L (ref 98–111)
Creatinine, Ser: 0.81 mg/dL (ref 0.44–1.00)
GFR, Estimated: >60 mL/min (ref >60)
Glucose, Bld: 143 mg/dL — ABNORMAL HIGH (ref 70–99)
Potassium: 3.9 mmol/L (ref 3.5–5.1)
Sodium: 137 mmol/L (ref 135–145)

## 2020-07-10 MED ORDER — AZITHROMYCIN 500 MG PO TABS: 500.0000 mg | ORAL_TABLET | Freq: Every day | ORAL | 0 refills | Status: AC

## 2020-07-10 NOTE — Progress Notes (Signed)
Discharge order received. Patient mental status is at baseline. Vital signs stable . No signs of acute distress. Discharge instructions given. Patient verbalized understanding. No other issues noted at this time.   

## 2020-07-10 NOTE — Clinical Social Work Note (Signed)
Patient has orders to discharge home today. [employer] representative is aware. MD will enter [facility] orders for RN, PT, OT. Her ride is here to take her home. No further concerns. CSW signing off.  Dayton Scrape, [place]

## 2020-07-10 NOTE — Discharge Instructions (Signed)
Community-Acquired Pneumonia, Adult Pneumonia is an infection of the lungs. It causes swelling in the airways of the lungs. Mucus and fluid may also build up inside the airways. One type of pneumonia can happen while a person is in a hospital. A different type can happen when a person is not in a hospital (community-acquired pneumonia).  What are the causes?  This condition is caused by germs (viruses, bacteria, or fungi). Some types of germs can be passed from one person to another. This can happen when you breathe in droplets from the cough or sneeze of an infected person. What increases the risk? You are more likely to develop this condition if you:  Have a long-term (chronic) disease, such as: ? Chronic obstructive pulmonary disease (COPD). ? Asthma. ? Cystic fibrosis. ? Congestive heart failure. ? Diabetes. ? Kidney disease.  Have HIV.  Have sickle cell disease.  Have had your spleen removed.  Do not take good care of your teeth and mouth (poor dental hygiene).  Have a medical condition that increases the risk of breathing in droplets from your own mouth and nose.  Have a weakened body defense system (immune system).  Are a smoker.  Travel to areas where the germs that cause this illness are common.  Are around certain animals or the places they live. What are the signs or symptoms?  A dry cough.  A wet (productive) cough.  Fever.  Sweating.  Chest pain. This often happens when breathing deeply or coughing.  Fast breathing or trouble breathing.  Shortness of breath.  Shaking chills.  Feeling tired (fatigue).  Muscle aches. How is this treated? Treatment for this condition depends on many things. Most adults can be treated at home. In some cases, treatment must happen in a hospital. Treatment may include:  Medicines given by mouth or through an IV tube.  Being given extra oxygen.  Respiratory therapy. In rare cases, treatment for very bad pneumonia  may include:  Using a machine to help you breathe.  Having a procedure to remove fluid from around your lungs. Follow these instructions at home: Medicines  Take over-the-counter and prescription medicines only as told by your doctor. ? Only take cough medicine if you are losing sleep.  If you were prescribed an antibiotic medicine, take it as told by your doctor. Do not stop taking the antibiotic even if you start to feel better. General instructions   Sleep with your head and neck raised (elevated). You can do this by sleeping in a recliner or by putting a few pillows under your head.  Rest as needed. Get at least 8 hours of sleep each night.  Drink enough water to keep your pee (urine) pale yellow.  Eat a healthy diet that includes plenty of vegetables, fruits, whole grains, low-fat dairy products, and lean protein.  Do not use any products that contain nicotine or tobacco. These include cigarettes, e-cigarettes, and chewing tobacco. If you need help quitting, ask your doctor.  Keep all follow-up visits as told by your doctor. This is important. How is this prevented? A shot (vaccine) can help prevent pneumonia. Shots are often suggested for:  People older than 75 years of age.  People older than 75 years of age who: ? Are having cancer treatment. ? Have long-term (chronic) lung disease. ? Have problems with their body's defense system. You may also prevent pneumonia if you take these actions:  Get the flu (influenza) shot every year.  Go to the dentist as   often as told.  Wash your hands often. If you cannot use soap and water, use hand sanitizer. Contact a doctor if:  You have a fever.  You lose sleep because your cough medicine does not help. Get help right away if:  You are short of breath and it gets worse.  You have more chest pain.  Your sickness gets worse. This is very serious if: ? You are an older adult. ? Your body's defense system is weak.  You  cough up blood. Summary  Pneumonia is an infection of the lungs.  Most adults can be treated at home. Some will need treatment in a hospital.  Drink enough water to keep your pee pale yellow.  Get at least 8 hours of sleep each night. This information is not intended to replace advice given to you by your health care provider. Make sure you discuss any questions you have with your health care provider. Document Revised: 10/26/2018 Document Reviewed: 03/03/2018 Elsevier Patient Education  2020 [employer]  

## 2020-07-11 ENCOUNTER — Telehealth: Payer: Self-pay

## 2020-07-11 NOTE — Telephone Encounter (Signed)
Transition Care Management Unsuccessful Follow-up Telephone Call  Date of discharge and from where:  07/10/20-[facility]  Attempts:  1st Attempt  Reason for unsuccessful TCM follow-up call:  No answer/busy

## 2020-07-12 ENCOUNTER — Other Ambulatory Visit: Payer: Self-pay | Admitting: Family Medicine

## 2020-07-12 DIAGNOSIS — J189 Pneumonia, unspecified organism: Secondary | ICD-10-CM

## 2020-07-12 LAB — LEGIONELLA PNEUMOPHILA SEROGP 1 UR AG: L. pneumophila Serogp 1 Ur Ag: POSITIVE

## 2020-07-12 MED ORDER — DOXYCYCLINE HYCLATE 100 MG PO TABS
100.0000 mg | ORAL_TABLET | Freq: Two times a day (BID) | ORAL | 0 refills | Status: DC
Start: 2020-07-12 — End: 2020-07-30

## 2020-07-12 NOTE — Progress Notes (Signed)
Legionella positive--Doxycycline sent in--pt informed by on call nurse.

## 2020-07-13 NOTE — Discharge Summary (Signed)
[facility] [place] NAME: Mariah [name]    MR#:  [id number]  DATE OF BIRTH:  [birth year]  DATE OF ADMISSION:  07/09/2020   ADMITTING PHYSICIAN: Vianne Bulls, MD  DATE OF DISCHARGE: 07/10/2020  3:44 PM  PRIMARY CARE PHYSICIAN: [name], MD   ADMISSION DIAGNOSIS:  Pneumonia [J18.9] Healthcare-associated pneumonia [J18.9] Acute respiratory failure with hypoxia ([place]) [J96.01] DISCHARGE DIAGNOSIS:  Principal Problem:   Pneumonia Active Problems:   COPD with acute exacerbation ([place])   Psoriatic arthritis ([place])   Peripheral arterial disease (HCC)   Pancytopenia (HCC)   Chronic diastolic CHF (congestive heart failure) (HCC)   Congestive heart failure (HCC)   History of DVT (deep vein thrombosis)   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 Abdominal aortic aneurysm (AAA) ([place])   . Arthritis   . COPD (chronic obstructive pulmonary disease) ([place])   . GERD (gastroesophageal reflux disease)   . Headache    sinus  . History of adenomatous polyp of colon   . Hyperlipidemia   . Hypertension   . Psoriasis   . Right lumbar radiculitis   . Vitamin D deficiency    HOSPITAL COURSE:  Mariah [name] a 74 y.o.femalewith medical history significant forpsoriatic arthritis, COPD, nocturnal oxygen requirement, chronic diastolic CHF, CKD, memory loss, history of DVT on Eliquis, and recent treatment with steroids and antibiotics for COPD exacerbation, admitted progressive shortness of breath, hypoxia at home, cough, and sinus congestion. Patient was treated 1 week ago with azithromycin and prednisone for COPD exacerbation, was also on doxycycline and Keflex recently for skin infection, has had worsening shortness of breath and cough over the past couple days.  Pneumonia; COPD with acute on chronic exacerbation -Presented with worsening SOB and hypoxia at home despite recent  treatment with antibiotics and prednisone and is found to have CXR findings concerning for multifocal PNA, tachypnea at rest, and leukopenia -Procalcitonin - undetectable  -- CXR findings right LL PNA -- she is negative for COVID and flu twice in 2 days -Blood cultures negative --Treated with Solu-Medrol, cefepime and zithromax--changed to oral azithromycin at discharge --Patient really wanted to leave.  She was back to her baseline.  She was discharged home safely. -Post discharge her urine Legionella came back positive.  I discussed this with Dr. Karma Ganja from pulmonary who was agreeable with treatment with azithromycin and had to see her as an outpatient in the office.  Please note patient has been afebrile, had no leukocytosis and negative procalcitonin.  She also recently completed course of doxycycline.  Her symptoms had resolved at discharge.  Pancytopenia -Hgb appears stable but WBC and platelets are decreased -ANC is 1340, ALC 480 and platelets 112k on admission -Likely due to infection and/or methotrexate -Counts remain stable  Multifocal atrial tachycardia -Patient developed tachycardia to 130s in ED, EKG consistent with multifocal atrial tachycardia -Likely precipitated by her pulmonary disease -Continue metoprolol  Chronic diastolic CHF -Appears compensated -EF was normal in November 2021 -Continue Lasix, monitor weight  Psoriais; psoriatic arthritis -Held methotrexate while monitoring blood counts, continue topicals -- follows with Dr. Precious Reel.  Can resume methotrexate at discharge  History of DVT -No evidence for acute VTE, continue Eliquis(home med)  CKD stage II -SCr is 0.91 on admission, consistent with her apparent baseline  Memory loss -Continue Aricept   PAD  - Continue ASA and statin, encourage  smoking cessation   Chronic pain - No pain complaints on admission  - Continue home medications    DISCHARGE CONDITIONS:  Stable CONSULTS OBTAINED:   DRUG ALLERGIES:   Allergies  Allergen Reactions  . Clindamycin/Lincomycin     diarrhea  . Amoxicillin Diarrhea   DISCHARGE MEDICATIONS:   Allergies as of 07/10/2020      Reactions   Clindamycin/lincomycin    diarrhea   Amoxicillin Diarrhea      Medication List    STOP taking these medications   cephALEXin 500 MG capsule Commonly known as: KEFLEX   doxycycline 100 MG tablet Commonly known as: VIBRA-TABS     TAKE these medications   acetaminophen 325 MG tablet Commonly known as: TYLENOL Take 2 tablets (650 mg total) by mouth every 6 (six) hours as needed for mild pain (or Fever >/= 101).   albuterol 108 (90 Base) MCG/ACT inhaler Commonly known as: VENTOLIN HFA INHALE 1-2 PUFFS INTO THE LUNGS EVERY 6 HOURS AS NEEDED FOR WHEEZING OR SHORTNESS OF BREATH.   apixaban 5 MG Tabs tablet Commonly known as: ELIQUIS Take 1 tablet (5 mg total) by mouth 2 (two) times daily.   ascorbic acid 250 MG tablet Commonly known as: VITAMIN C Take 1 tablet (250 mg total) by mouth 2 (two) times daily.   aspirin EC 81 MG tablet Take 81 mg by mouth daily. Swallow whole.   azithromycin 500 MG tablet Commonly known as: Zithromax Take 1 tablet (500 mg total) by mouth daily for 3 days. Take 1 tablet daily for 3 days. What changed:  medication strength how much to take how to take this when to take this additional instructions   betamethasone dipropionate 0.05 % ointment Commonly known as: DIPROLENE Apply topically daily.   Breo Ellipta 100-25 MCG/INH Aepb Generic drug: fluticasone furoate-vilanterol Inhale 1 puff into the lungs daily.   calcipotriene 0.005 % cream Commonly known as: DOVONOX APPLY TO SKIN ONCE A DAY APPLY DAILY ON HANDS, FEET AND WRIST X 2 WEEKS, THEN USE 5 DAYS A WEEK   clobetasol 0.05 % external solution Commonly known as: TEMOVATE APPLY TO AFFECTED AREA TWICE A DAY   Cosentyx Sensoready (300 MG) 150  MG/ML [place] Generic drug: Secukinumab (300 MG Dose) Inject 2 Syringes into the skin every 28 (twenty-eight) days.   donepezil 10 MG tablet Commonly known as: ARICEPT Take 10 mg by mouth daily.   ferrous [id number] C-folic acid capsule Commonly known as: TRINSICON / FOLTRIN Take 1 capsule by mouth 2 (two) times daily after a meal.   ferrous sulfate 325 (65 FE) MG tablet Take 325 mg by mouth 2 (two) times daily.   furosemide 40 MG tablet Commonly known as: LASIX Take 1 tablet (40 mg total) by mouth daily. If weight 127 lb or less, take 20 mg by mouth daily.   gabapentin 300 MG capsule Commonly known as: NEURONTIN TAKE 1 CAPSULE BY MOUTH TWICE A DAY   guaiFENesin 600 MG 12 hr tablet Commonly known as: MUCINEX Take 1 tablet (600 mg total) by mouth 2 (two) times daily.   levocetirizine 5 MG tablet Commonly known as: XYZAL TAKE 1 TABLET BY MOUTH EVERY DAY IN THE EVENING   methotrexate 2.5 MG tablet Commonly known as: RHEUMATREX Take 15 mg by mouth once a week.   metoprolol succinate 50 MG 24 hr tablet Commonly known as: TOPROL-XL TAKE 1 TABLET (50 MG TOTAL) BY MOUTH DAILY. TAKE WITH OR IMMEDIATELY FOLLOWING A MEAL.   nicotine 21 mg/24hr patch  Commonly known as: NICODERM CQ - dosed in mg/24 hours Place 1 patch (21 mg total) onto the skin daily.   pantoprazole 20 MG tablet Commonly known as: PROTONIX TAKE 1 TABLET (20 MG TOTAL) BY MOUTH 2 (TWO) TIMES DAILY BEFORE A MEAL.   potassium chloride SA 20 MEQ tablet Commonly known as: KLOR-CON Take 2 tablets (40 mEq total) by mouth daily.   predniSONE 20 MG tablet Commonly known as: Deltasone Take 1 tablet (20 mg total) by mouth daily.   rOPINIRole 1 MG tablet Commonly known as: REQUIP TAKE 1 TABLET (1 MG TOTAL) BY MOUTH AT BEDTIME.   simvastatin 20 MG tablet Commonly known as: ZOCOR TAKE 1 TABLET BY MOUTH EVERY DAY   traMADol 50 MG tablet Commonly known as: ULTRAM TAKE 1 TABLET BY MOUTH EVERY 6 HOURS AS  NEEDED FOR MODERATE OR SEVERE PAIN   Vitamin D3 50 MCG (2000 UT) Tabs Take 2,000 Units by mouth daily.      DISCHARGE INSTRUCTIONS:   DIET:  Cardiac diet DISCHARGE CONDITION:  Stable ACTIVITY:  Activity as tolerated OXYGEN:  Home Oxygen: Yes.    Oxygen Delivery: 2 liters/min via Patient connected to nasal cannula oxygen DISCHARGE LOCATION:  home with home health RN PT, OT  If you experience worsening of your admission symptoms, develop shortness of breath, life threatening emergency, suicidal or homicidal thoughts you must seek medical attention immediately by calling 911 or calling your MD immediately  if symptoms less severe.  You Must read complete instructions/literature along with all the possible adverse reactions/side effects for all the Medicines you take and that have been prescribed to you. Take any new Medicines after you have completely understood and accpet all the possible adverse reactions/side effects.   Please note  You were cared for by a hospitalist during your hospital stay. If you have any questions about your discharge medications or the care you received while you were in the hospital after you are discharged, you can call the unit and asked to speak with the hospitalist on call if the hospitalist that took care of you is not available. Once you are discharged, your primary care physician will handle any further medical issues. Please note that NO REFILLS for any discharge medications will be authorized once you are discharged, as it is imperative that you return to your primary care physician (or establish a relationship with a primary care physician if you do not have one) for your aftercare needs so that they can reassess your need for medications and monitor your lab values.    On the day of Discharge:  VITAL SIGNS:  Blood pressure (!) 133/55, pulse 79, temperature (!) 97.4 F (36.3 C), temperature source Oral, resp. rate 16, height 5\' 1"  (1.549 m), weight  57 kg, SpO2 98 %. PHYSICAL EXAMINATION:  GENERAL:  74 y.o.-year-old patient lying in the bed with no acute distress.  EYES: Pupils equal, round, reactive to light and accommodation. No scleral icterus. Extraocular muscles intact.  HEENT: Head atraumatic, normocephalic. Oropharynx and nasopharynx clear.  NECK:  Supple, no jugular venous distention. No thyroid enlargement, no tenderness.  LUNGS: Normal breath sounds bilaterally, no wheezing, rales,rhonchi or crepitation. No use of accessory muscles of respiration.  CARDIOVASCULAR: S1, S2 normal. No murmurs, rubs, or gallops.  ABDOMEN: Soft, non-tender, non-distended. Bowel sounds present. No organomegaly or mass.  EXTREMITIES: No pedal edema, cyanosis, or clubbing.  NEUROLOGIC: Cranial nerves II through XII are intact. Muscle strength 5/5 in all extremities. Sensation intact. Gait not  checked.  PSYCHIATRIC: The patient is alert and oriented x 3.  SKIN: No obvious rash, lesion, or ulcer.  DATA REVIEW:   CBC Recent Labs  Lab 07/10/20 0509  WBC 3.5*  HGB 8.7*  HCT 24.7*  PLT 93*    Chemistries  Recent Labs  Lab 07/08/20 1626 07/10/20 0509  NA 135 137  K 3.5 3.9  CL 97* 102  CO2 29 25  GLUCOSE 116* 143*  BUN 16 21  CREATININE 0.91 0.81  CALCIUM 8.5* 8.9  AST 18  --   ALT 14  --   ALKPHOS 95  --   BILITOT 0.7  --      Outpatient follow-up  Follow-up Information    [name], MD. Go on 07/25/2020.   Specialty: Family Medicine Why: 9:20am appointment  Contact information: [address] [place] [address] [phone]              Please note she remains at high risk for readmission.  She has had 3 readmissions in last 34-months.  I have requested her to follow-up with pulmonology Dr. Lanney Gins at [facility] who is also aware of her.  Management plans discussed with the patient, family and they are in agreement.  CODE STATUS: Prior   TOTAL TIME TAKING CARE OF THIS PATIENT: 45 minutes.     Max Sane M.D on 07/13/2020 at 6:15 AM  [facility]   CC: Primary care physician; [name], MD   Note: This dictation was prepared with Dragon dictation along with smaller phrase technology. Any transcriptional errors that result from this process are unintentional.

## 2020-07-14 LAB — CULTURE, BLOOD (ROUTINE X 2)
Culture: NO GROWTH
Culture: NO GROWTH
Special Requests: ADEQUATE

## 2020-07-15 ENCOUNTER — Telehealth: Payer: Self-pay | Admitting: Family Medicine

## 2020-07-15 DIAGNOSIS — F17210 Nicotine dependence, cigarettes, uncomplicated: Secondary | ICD-10-CM | POA: Diagnosis not present

## 2020-07-15 DIAGNOSIS — E785 Hyperlipidemia, unspecified: Secondary | ICD-10-CM | POA: Diagnosis not present

## 2020-07-15 DIAGNOSIS — K219 Gastro-esophageal reflux disease without esophagitis: Secondary | ICD-10-CM | POA: Diagnosis not present

## 2020-07-15 DIAGNOSIS — Z7982 Long term (current) use of aspirin: Secondary | ICD-10-CM | POA: Diagnosis not present

## 2020-07-15 DIAGNOSIS — I82442 Acute embolism and thrombosis of left tibial vein: Secondary | ICD-10-CM | POA: Diagnosis not present

## 2020-07-15 DIAGNOSIS — Z7901 Long term (current) use of anticoagulants: Secondary | ICD-10-CM | POA: Diagnosis not present

## 2020-07-15 DIAGNOSIS — J9611 Chronic respiratory failure with hypoxia: Secondary | ICD-10-CM | POA: Diagnosis not present

## 2020-07-15 DIAGNOSIS — F0150 Vascular dementia without behavioral disturbance: Secondary | ICD-10-CM | POA: Diagnosis not present

## 2020-07-15 DIAGNOSIS — M6281 Muscle weakness (generalized): Secondary | ICD-10-CM | POA: Diagnosis not present

## 2020-07-15 DIAGNOSIS — D631 Anemia in chronic kidney disease: Secondary | ICD-10-CM | POA: Diagnosis not present

## 2020-07-15 DIAGNOSIS — I714 Abdominal aortic aneurysm, without rupture: Secondary | ICD-10-CM | POA: Diagnosis not present

## 2020-07-15 DIAGNOSIS — I130 Hypertensive heart and chronic kidney disease with heart failure and stage 1 through stage 4 chronic kidney disease, or unspecified chronic kidney disease: Secondary | ICD-10-CM | POA: Diagnosis not present

## 2020-07-15 DIAGNOSIS — J449 Chronic obstructive pulmonary disease, unspecified: Secondary | ICD-10-CM | POA: Diagnosis not present

## 2020-07-15 DIAGNOSIS — M1990 Unspecified osteoarthritis, unspecified site: Secondary | ICD-10-CM | POA: Diagnosis not present

## 2020-07-15 DIAGNOSIS — S62636D Displaced fracture of distal phalanx of right little finger, subsequent encounter for fracture with routine healing: Secondary | ICD-10-CM | POA: Diagnosis not present

## 2020-07-15 DIAGNOSIS — Z7951 Long term (current) use of inhaled steroids: Secondary | ICD-10-CM | POA: Diagnosis not present

## 2020-07-15 DIAGNOSIS — L409 Psoriasis, unspecified: Secondary | ICD-10-CM | POA: Diagnosis not present

## 2020-07-15 DIAGNOSIS — E43 Unspecified severe protein-calorie malnutrition: Secondary | ICD-10-CM | POA: Diagnosis not present

## 2020-07-15 DIAGNOSIS — I5042 Chronic combined systolic (congestive) and diastolic (congestive) heart failure: Secondary | ICD-10-CM | POA: Diagnosis not present

## 2020-07-15 DIAGNOSIS — E559 Vitamin D deficiency, unspecified: Secondary | ICD-10-CM | POA: Diagnosis not present

## 2020-07-15 DIAGNOSIS — S7011XD Contusion of right thigh, subsequent encounter: Secondary | ICD-10-CM | POA: Diagnosis not present

## 2020-07-15 DIAGNOSIS — G2581 Restless legs syndrome: Secondary | ICD-10-CM | POA: Diagnosis not present

## 2020-07-15 DIAGNOSIS — Z7952 Long term (current) use of systemic steroids: Secondary | ICD-10-CM | POA: Diagnosis not present

## 2020-07-15 DIAGNOSIS — M5416 Radiculopathy, lumbar region: Secondary | ICD-10-CM | POA: Diagnosis not present

## 2020-07-15 DIAGNOSIS — N183 Chronic kidney disease, stage 3 unspecified: Secondary | ICD-10-CM | POA: Diagnosis not present

## 2020-07-15 NOTE — Telephone Encounter (Signed)
That's fine

## 2020-07-15 NOTE — Telephone Encounter (Signed)
Home Health Verbal Orders - Caller/Agency: [facility] w/ [facility]  Callback Number:[phone]  Requesting OT/PT/Skilled Nursing/Social Work/Speech Therapy: OT Eval and treatment    PT 2 x week for 3 weeks and 1 week for 1 time

## 2020-07-16 NOTE — Telephone Encounter (Signed)
Soni advised as below. °

## 2020-07-17 ENCOUNTER — Other Ambulatory Visit: Payer: Self-pay | Admitting: Adult Health

## 2020-07-17 DIAGNOSIS — A481 Legionnaires' disease: Secondary | ICD-10-CM

## 2020-07-17 DIAGNOSIS — J189 Pneumonia, unspecified organism: Secondary | ICD-10-CM

## 2020-07-17 HISTORY — DX: Legionnaires' disease: A48.1

## 2020-07-17 NOTE — Progress Notes (Signed)
Dr. Sullivan Lone sent in Doxycycline when he received Legionella positive. Please  be sure lab report was reported to health department. Patient  has a follow up with Dr. Leonard Schwartz on 07/26/2019. Has been referred to pulmonary.

## 2020-07-20 DIAGNOSIS — J449 Chronic obstructive pulmonary disease, unspecified: Secondary | ICD-10-CM | POA: Diagnosis not present

## 2020-07-22 DIAGNOSIS — N183 Chronic kidney disease, stage 3 unspecified: Secondary | ICD-10-CM | POA: Diagnosis not present

## 2020-07-22 DIAGNOSIS — Z7951 Long term (current) use of inhaled steroids: Secondary | ICD-10-CM | POA: Diagnosis not present

## 2020-07-22 DIAGNOSIS — K219 Gastro-esophageal reflux disease without esophagitis: Secondary | ICD-10-CM | POA: Diagnosis not present

## 2020-07-22 DIAGNOSIS — M5416 Radiculopathy, lumbar region: Secondary | ICD-10-CM | POA: Diagnosis not present

## 2020-07-22 DIAGNOSIS — F0150 Vascular dementia without behavioral disturbance: Secondary | ICD-10-CM | POA: Diagnosis not present

## 2020-07-22 DIAGNOSIS — E785 Hyperlipidemia, unspecified: Secondary | ICD-10-CM | POA: Diagnosis not present

## 2020-07-22 DIAGNOSIS — J9611 Chronic respiratory failure with hypoxia: Secondary | ICD-10-CM | POA: Diagnosis not present

## 2020-07-22 DIAGNOSIS — I5042 Chronic combined systolic (congestive) and diastolic (congestive) heart failure: Secondary | ICD-10-CM | POA: Diagnosis not present

## 2020-07-22 DIAGNOSIS — D631 Anemia in chronic kidney disease: Secondary | ICD-10-CM | POA: Diagnosis not present

## 2020-07-22 DIAGNOSIS — L409 Psoriasis, unspecified: Secondary | ICD-10-CM | POA: Diagnosis not present

## 2020-07-22 DIAGNOSIS — M6281 Muscle weakness (generalized): Secondary | ICD-10-CM | POA: Diagnosis not present

## 2020-07-22 DIAGNOSIS — Z7952 Long term (current) use of systemic steroids: Secondary | ICD-10-CM | POA: Diagnosis not present

## 2020-07-22 DIAGNOSIS — Z7982 Long term (current) use of aspirin: Secondary | ICD-10-CM | POA: Diagnosis not present

## 2020-07-22 DIAGNOSIS — S7011XD Contusion of right thigh, subsequent encounter: Secondary | ICD-10-CM | POA: Diagnosis not present

## 2020-07-22 DIAGNOSIS — M1990 Unspecified osteoarthritis, unspecified site: Secondary | ICD-10-CM | POA: Diagnosis not present

## 2020-07-22 DIAGNOSIS — I82442 Acute embolism and thrombosis of left tibial vein: Secondary | ICD-10-CM | POA: Diagnosis not present

## 2020-07-22 DIAGNOSIS — S62636D Displaced fracture of distal phalanx of right little finger, subsequent encounter for fracture with routine healing: Secondary | ICD-10-CM | POA: Diagnosis not present

## 2020-07-22 DIAGNOSIS — E43 Unspecified severe protein-calorie malnutrition: Secondary | ICD-10-CM | POA: Diagnosis not present

## 2020-07-22 DIAGNOSIS — Z7901 Long term (current) use of anticoagulants: Secondary | ICD-10-CM | POA: Diagnosis not present

## 2020-07-22 DIAGNOSIS — F17210 Nicotine dependence, cigarettes, uncomplicated: Secondary | ICD-10-CM | POA: Diagnosis not present

## 2020-07-22 DIAGNOSIS — G2581 Restless legs syndrome: Secondary | ICD-10-CM | POA: Diagnosis not present

## 2020-07-22 DIAGNOSIS — I130 Hypertensive heart and chronic kidney disease with heart failure and stage 1 through stage 4 chronic kidney disease, or unspecified chronic kidney disease: Secondary | ICD-10-CM | POA: Diagnosis not present

## 2020-07-22 DIAGNOSIS — E559 Vitamin D deficiency, unspecified: Secondary | ICD-10-CM | POA: Diagnosis not present

## 2020-07-22 DIAGNOSIS — I714 Abdominal aortic aneurysm, without rupture: Secondary | ICD-10-CM | POA: Diagnosis not present

## 2020-07-22 DIAGNOSIS — J449 Chronic obstructive pulmonary disease, unspecified: Secondary | ICD-10-CM | POA: Diagnosis not present

## 2020-07-25 ENCOUNTER — Inpatient Hospital Stay: Payer: PPO | Admitting: Family Medicine

## 2020-07-30 ENCOUNTER — Ambulatory Visit (INDEPENDENT_AMBULATORY_CARE_PROVIDER_SITE_OTHER): Payer: PPO | Admitting: Family Medicine

## 2020-07-30 ENCOUNTER — Other Ambulatory Visit: Payer: Self-pay

## 2020-07-30 ENCOUNTER — Encounter: Payer: Self-pay | Admitting: Family Medicine

## 2020-07-30 VITALS — BP 130/80 | HR 73 | Temp 98.7°F | Resp 16 | Wt 127.8 lb

## 2020-07-30 DIAGNOSIS — D61818 Other pancytopenia: Secondary | ICD-10-CM | POA: Diagnosis not present

## 2020-07-30 DIAGNOSIS — Z23 Encounter for immunization: Secondary | ICD-10-CM | POA: Diagnosis not present

## 2020-07-30 DIAGNOSIS — J9601 Acute respiratory failure with hypoxia: Secondary | ICD-10-CM | POA: Diagnosis not present

## 2020-07-30 DIAGNOSIS — J189 Pneumonia, unspecified organism: Secondary | ICD-10-CM

## 2020-07-30 DIAGNOSIS — J411 Mucopurulent chronic bronchitis: Secondary | ICD-10-CM | POA: Diagnosis not present

## 2020-07-30 NOTE — Patient Instructions (Signed)
Acute Respiratory Failure, Adult Acute respiratory failure is a condition that is a medical emergency. It can develop quickly, and it should be treated right away. There are two types of acute respiratory failure:  Type I respiratory failure is when the lungs are not able to get enough oxygen into the blood. This causes the blood oxygen level to drop.  Type II respiratory failure is when carbon dioxide is not passing from the lungs out of the body. This causes carbon dioxide to build up in the blood. A person may have one type of acute respiratory failure or have both types at the same time. What are the causes? Common causes of type I respiratory failure include:  Trauma to the lung, chest, ribs, or tissues around the lung.  Pneumonia.  Lung diseases, such as pulmonary fibrosis or asthma.  Smoke, chemical, or water inhalation.  A blood clot in the lungs (pulmonary embolism).  A blood infection (sepsis).  Heart attack. Common causes of type II respiratory failure include:  Stroke.  A spinal cord injury.  A drug or alcohol overdose.  A blood infection (sepsis).  Cardiac arrest. What increases the risk? This condition is more likely to develop in people who have:  Lung diseases such as asthma or chronic obstructive pulmonary disease (COPD).  A condition that damages or weakens the muscles, nerves, bones, or tissues that are involved in breathing, such as myasthenia gravis or Guillain-Barr syndrome.  A serious infection.  A health problem that blocks the unconscious reflex that is involved in breathing, such as hypothyroidism or sleep apnea. What are the signs or symptoms? Trouble breathing is the main symptom of acute respiratory failure. Symptoms may also include:  Fast breathing.  Restlessness or anxiety.  Breathing loudly (wheezing) and grunting.  Fast or irregular heartbeats (palpitations).  Confusion or changes in behavior.  Feeling tired (fatigue),  sleeping more than normal, or being hard to wake.  Skin, lips, or fingernails that appear blue (cyanosis). How is this diagnosed? This condition may be diagnosed based on:  Your medical history and a physical exam. Your health care provider will listen to your heart and lungs to check for abnormal sounds.  Tests to confirm the diagnosis and determine the cause of respiratory failure. These tests may include: ? Measuring the amount of oxygen in your blood (pulse oximetry). The measurement comes from a small device that is placed on your finger, earlobe, or toe. ? Blood tests to measure blood oxygen and carbon dioxide and to look for signs of infection. ? Tests on a sample of the fluid that surrounds the spinal cord (cerebrospinal fluid) or a sample of fluid that is drawn from the windpipe (trachea) to check for infections. ? Chest X-ray. ? Electrocardiogram (ECG) to look at the heart's electrical activity.   How is this treated? Treatment for this condition usually takes place in a hospital intensive care unit (ICU). Treatment depends on what is causing the condition. It may include one or more of these treatments:  Oxygen may be given through your nose or a face mask.  A device such as a continuous positive airway pressure (CPAP) machine or bi-level positive airway pressure (BPAP) machine may be used to help you breathe. The device gives you oxygen and pressure.  Breathing treatments, fluids, and other medicines may be given.  A ventilator may be used to help you breathe. The machine gives you oxygen and pressure. A tube is put into your mouth and trachea to connect the   ventilator. ? If this treatment is needed longer term, a tracheostomy may be placed. A tracheostomy is a breathing tube put through your neck into your trachea.  In extreme cases, extracorporeal life support (ECLS) may be used. This treatment temporarily takes over the function of the heart and lungs, supplying oxygen and  removing carbon dioxide. ECLS gives the lungs a chance to recover. Follow these instructions at home: Medicines  Take over-the-counter and prescription medicines only as told by your health care provider.  If you were prescribed an antibiotic medicine, take it as told by your health care provider. Do not stop using the antibiotic even if you start to feel better.  If you are taking blood thinners: ? Talk with your health care provider before you take any medicines that contain aspirin or NSAIDs, such as ibuprofen. These medicines increase your risk for dangerous bleeding. ? Take your medicine exactly as told, at the same time every day. ? Avoid activities that could cause injury or bruising, and follow instructions about how to prevent falls. ? Wear a medical alert bracelet or carry a card that lists what medicines you take. General instructions  Return to your normal activities as told by your health care provider. Ask your health care provider what activities are safe for you.  Do not use any products that contain nicotine or tobacco, such as cigarettes, e-cigarettes, and chewing tobacco. If you need help quitting, ask your health care provider.  Do not drink alcohol if: ? Your health care provider tells you not to drink. ? You are pregnant, may be pregnant, or are planning to become pregnant.  Wear compression stockings as told by your health care provider. These stockings help to prevent blood clots and reduce swelling in your legs.  Attend any physical therapy and pulmonary rehabilitation as told by your health care provider.  Keep all follow-up visits as told by your health care provider. This is important. How is this prevented?  If you have an infection or a medical condition that may lead to acute respiratory failure, make sure you get proper treatment. Contact a health care provider if:  You have a fever.  Your symptoms do not improve or they get worse. Get help right  away if:  You are having trouble breathing.  You lose consciousness.  You develop a fast heart rate.  Your fingers, lips, or other areas turn blue.  You are confused. These symptoms may represent a serious problem that is an emergency. Do not wait to see if the symptoms will go away. Get medical help right away. Call your local emergency services (911 in the [place] Do not drive yourself to the hospital. Summary  Acute respiratory failure is a medical emergency. It can develop quickly, and it should be treated right away.  Treatment for this condition usually takes place in a hospital intensive care unit (ICU). Treatment may include oxygen, fluids, and medicines. A device may be used to help you breathe, such as a ventilator.  Take over-the-counter and prescription medicines only as told by your health care provider.  Contact a health care provider if your symptoms do not improve or if they get worse. This information is not intended to replace advice given to you by your health care provider. Make sure you discuss any questions you have with your health care provider. Document Revised: 06/23/2019 Document Reviewed: 06/23/2019 Elsevier Patient Education  2021 Elsevier Inc.  

## 2020-07-30 NOTE — Progress Notes (Signed)
Established patient visit   Patient: Mariah [name]   DOB: 02/07/1946   75 y.o. Female  MRN: [id number] Visit Date: 07/30/2020  Today's healthcare provider: Lavon Paganini, MD   Chief Complaint  Patient presents with  . Hospitalization Follow-up   Subjective    HPI  Follow up Hospitalization  Patient was admitted to [facility] on 07/09/2020 and discharged on 07/10/2020. She was treated for pneumonia, acute respiratory failure with hypoxia. Treatment for this included Solu-Medrol, Cefepime and Zithromax changed to oral Azithromycin at discharge. Telephone follow up was done on 07/11/2020, unsuccessful She reports excellent compliance with treatment. She reports this condition is improved.   She is having trouble using home O2 as the tank is heavy to move around.  She is requesting a portable home O2 tank and pulse oximeter  She does not have follow-up with pulmonology scheduled  She needs CBC today to follow-up on pancytopenia  Upcoming follow-up with cardiology  She is not vaccinated against COVID-19 and will consider   -----------------------------------------------------------------------------------------   Patient Active Problem List   Diagnosis Date Noted  . Legionella infection ([place]) 07/17/2020  . Pneumonia of right lung due to infectious organism 07/09/2020  . History of DVT (deep vein thrombosis) 07/09/2020  . 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 Left arm cellulitis 06/26/2020  . Hematoma of right hip 06/18/2020  . Congestive heart failure ([place]) 06/18/2020  . Weakness   . Closed nondisplaced fracture of distal phalanx of right little finger 06/06/2020  . Closed nondisplaced fracture of distal phalanx of right thumb 06/06/2020  . Protein-calorie malnutrition, severe 06/05/2020  . CAP (community acquired pneumonia) 06/03/2020  . Hypoalbuminemia 06/03/2020  . Cutaneous horn 06/03/2020  . Pleural effusion 06/03/2020  . Chronic diastolic CHF  (congestive heart failure) ([place]) 06/03/2020  . Acute respiratory failure with hypoxia ([place]) 06/02/2020  . Irregular heartbeat 05/30/2020  . Head injury 05/30/2020  . Bilateral edema of lower extremity 05/30/2020  . Closed fracture of right hand 05/30/2020  . Blood transfusion reaction 05/30/2020  . Pancytopenia ([place]) 05/26/2020  . CKD (chronic kidney disease), stage III ([place]) 05/26/2020  . Hematoma of right thigh 05/26/2020  . Finger fracture, right 05/26/2020  . Dyspnea 05/26/2020  . Acute deep vein thrombosis (DVT) of tibial vein of left lower extremity ([place]) 04/05/2020  . Mental confusion 04/01/2020  . Unintentional weight loss 03/01/2020  . Fall (on)(from) sidewalk curb, initial encounter 03/01/2020  . Memory loss 03/01/2020  . Rash of back 05/15/2019  . Allergic rhinitis 01/10/2018  . Cholelithiasis 03/16/2016  . Peripheral arterial disease ([place]) 02/05/2016  . 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 Abdominal aortic aneurysm (AAA) ([place]) 01/13/2016  . 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 Nerve root inflammation 10/25/2015  . Psoriatic arthritis ([place]) 10/07/2015  . 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 Right hip pain 08/12/2015  . Arthralgia of multiple joints 08/12/2015  . Age related osteoporosis 08/12/2015  . COPD with acute exacerbation ([place]) 06/24/2015  . Senile purpura ([place]) 06/24/2015  . Abnormal serum level of alkaline phosphatase 06/18/2015  . Elevated hemoglobin ([place]) 06/18/2015  . Lethargy 06/18/2015  . Fistula 06/18/2015  . Blood glucose elevated 06/18/2015  . Gastro-esophageal reflux disease without esophagitis 06/18/2015  . Restless leg 06/18/2015  . Avitaminosis D 06/18/2015  . Mechanical and motor problems with internal organs 10/11/2009  . Hypercholesteremia 10/11/2009  . Arthritis, degenerative 09/25/2009  . Essential (primary) hypertension 09/25/2009  . Psoriasis 09/25/2009  . Compulsive tobacco user syndrome  09/25/2009   Social History    Tobacco Use  . Smoking status: Current Every Day Smoker    Packs/day: 1.50    Years: 40.00    Pack years: 60.00  . Smokeless tobacco: Never Used  . Tobacco comment: 0.5 PPD 06/19/2020  Vaping Use  . Vaping Use: Some days  Substance Use Topics  . Alcohol use: Yes    Comment: occasionally glass of wine  . Drug use: No   Allergies  Allergen Reactions  . Clindamycin/Lincomycin     diarrhea  . Amoxicillin Diarrhea       Medications: Outpatient Medications Prior to Visit  Medication Sig  . acetaminophen (TYLENOL) 325 MG tablet Take 2 tablets (650 mg total) by mouth every 6 (six) hours as needed for mild pain (or Fever >/= 101).  [name] albuterol (VENTOLIN HFA) 108 (90 Base) MCG/ACT inhaler INHALE 1-2 PUFFS INTO THE LUNGS EVERY 6 HOURS AS NEEDED FOR WHEEZING OR SHORTNESS OF BREATH.  [name] apixaban (ELIQUIS) 5 MG TABS tablet Take 1 tablet (5 mg total) by mouth 2 (two) times daily.  [name] ascorbic acid (VITAMIN C) 250 MG tablet Take 1 tablet (250 mg total) by mouth 2 (two) times daily.  [name] aspirin EC 81 MG tablet Take 81 mg by mouth daily. Swallow whole.  . betamethasone dipropionate (DIPROLENE) 0.05 % ointment Apply topically daily.  . calcipotriene (DOVONOX) 0.005 % cream APPLY TO SKIN ONCE A DAY APPLY DAILY ON HANDS, FEET AND WRIST X 2 WEEKS, THEN USE 5 DAYS A WEEK  . Cholecalciferol (VITAMIN D3) 2000 units TABS Take 2,000 Units by mouth daily.  . clobetasol (TEMOVATE) 0.05 % external solution APPLY TO AFFECTED AREA TWICE A DAY  . COSENTYX SENSOREADY, 300 MG, 150 MG/ML SOAJ Inject 2 Syringes into the skin every 28 (twenty-eight) days.  [name] donepezil (ARICEPT) 10 MG tablet Take 10 mg by mouth daily.  . ferrous [id number] C-folic acid (TRINSICON / FOLTRIN) capsule Take 1 capsule by mouth 2 (two) times daily after a meal.  . ferrous sulfate 325 (65 FE) MG tablet Take 325 mg by mouth 2 (two) times daily.  . fluticasone furoate-vilanterol (BREO ELLIPTA) 100-25 MCG/INH AEPB Inhale 1 puff into  the lungs daily.  . furosemide (LASIX) 40 MG tablet Take 1 tablet (40 mg total) by mouth daily. If weight 127 lb or less, take 20 mg by mouth daily.  [name] gabapentin (NEURONTIN) 300 MG capsule TAKE 1 CAPSULE BY MOUTH TWICE A DAY  . guaiFENesin (MUCINEX) 600 MG 12 hr tablet Take 1 tablet (600 mg total) by mouth 2 (two) times daily.  [name] levocetirizine (XYZAL) 5 MG tablet TAKE 1 TABLET BY MOUTH EVERY DAY IN THE EVENING  . methotrexate (RHEUMATREX) 2.5 MG tablet Take 15 mg by mouth once a week.   . metoprolol succinate (TOPROL-XL) 50 MG 24 hr tablet TAKE 1 TABLET (50 MG TOTAL) BY MOUTH DAILY. TAKE WITH OR IMMEDIATELY FOLLOWING A MEAL.  . pantoprazole (PROTONIX) 20 MG tablet TAKE 1 TABLET (20 MG TOTAL) BY MOUTH 2 (TWO) TIMES DAILY BEFORE A MEAL.  [name] potassium chloride SA (KLOR-CON) 20 MEQ tablet Take 2 tablets (40 mEq total) by mouth daily.  [name] rOPINIRole (REQUIP) 1 MG tablet TAKE 1 TABLET (1 MG TOTAL) BY MOUTH AT BEDTIME.  . simvastatin (ZOCOR) 20 MG tablet TAKE 1 TABLET BY MOUTH EVERY DAY  . traMADol (ULTRAM) 50 MG tablet TAKE 1 TABLET BY MOUTH EVERY 6 HOURS AS NEEDED FOR MODERATE OR SEVERE PAIN  . nicotine (NICODERM  CQ - DOSED IN MG/24 HOURS) 21 mg/24hr patch Place 1 patch (21 mg total) onto the skin daily. (Patient not taking: Reported on 07/30/2020)  . [DISCONTINUED] doxycycline (VIBRA-TABS) 100 MG tablet Take 1 tablet (100 mg total) by mouth 2 (two) times daily. (Patient not taking: Reported on 07/30/2020)  . [DISCONTINUED] predniSONE (DELTASONE) 20 MG tablet Take 1 tablet (20 mg total) by mouth daily. (Patient not taking: Reported on 07/30/2020)   No facility-administered medications prior to visit.    Review of Systems  Constitutional: Negative.   Respiratory: Positive for cough.   Cardiovascular: Negative.     Last CBC Lab Results  Component Value Date   WBC 3.5 (L) 07/10/2020   HGB 8.7 (L) 07/10/2020   HCT 24.7 (L) 07/10/2020   MCV 92.9 07/10/2020   MCH 32.7 07/10/2020   RDW 17.1 (H)  07/10/2020   PLT 93 (L) 07/10/2020      Objective    BP 130/80 (BP Location: Left Arm, Patient Position: Sitting, Cuff Size: Normal)   Pulse 73   Temp 98.7 F (37.1 C) (Oral)   Resp 16   Wt 127 lb 12.8 oz (58 kg)   SpO2 97%   BMI 24.15 kg/m  BP Readings from Last 3 Encounters:  07/30/20 130/80  07/10/20 (!) 133/55  07/08/20 118/73   Wt Readings from Last 3 Encounters:  07/30/20 127 lb 12.8 oz (58 kg)  07/09/20 125 lb 10.6 oz (57 kg)  07/08/20 128 lb 11.2 oz (58.4 kg)      Physical Exam Vitals reviewed.  Constitutional:      General: She is not in acute distress.    Appearance: Normal appearance. She is well-developed. She is not diaphoretic.  HENT:     Head: Normocephalic and atraumatic.  Eyes:     General: No scleral icterus.    Conjunctiva/sclera: Conjunctivae normal.  Neck:     Thyroid: No thyromegaly.  Cardiovascular:     Rate and Rhythm: Normal rate and regular rhythm.     Pulses: Normal pulses.     Heart sounds: Murmur heard.    Pulmonary:     Effort: Pulmonary effort is normal. No respiratory distress.     Breath sounds: Normal breath sounds. No wheezing, rhonchi or rales.  Musculoskeletal:     Cervical back: Neck supple.     Right lower leg: No edema.     Left lower leg: No edema.  Lymphadenopathy:     Cervical: No cervical adenopathy.  Skin:    General: Skin is warm and dry.     Findings: Rash present.  Neurological:     Mental Status: She is alert and oriented to person, place, and time. Mental status is at baseline.  Psychiatric:        Mood and Affect: Mood normal.        Behavior: Behavior normal.     No results found for any visits on 07/30/20.  Assessment & Plan     1. Acute respiratory failure with hypoxia ([place]) 2. Pneumonia due to infectious organism, unspecified laterality, unspecified part of lung 3. Mucopurulent chronic bronchitis ([place]) -Recent hospitalization for hospital-acquired pneumonia, COPD exacerbation, and acute  respiratory failure with hypoxemia - She was discharged with home O2, but she has not been able to use this at this time as the oxygen tank is too heavy for her to move around with - We will send prescription to home health company for portable oxygen tank and pulse oximeter - We were  unable to get a pulse ox on the patient today while in the office - Needs to follow-up with pulmonology-office number given today - No changes to medications at this time - Discussed the importance of COVID-19 vaccination and patient agrees today - We will follow-up with her in 3 to 4 weeks and give second vaccine at that time   4. Pancytopenia ([place]) -Noted to have low platelets and WBC during hospitalization - Thought to be related to infection or possibly biologic or autoimmune disease - Recheck CBC today - CBC w/Diff/Platelet  5. Encounter for immunization - [employer] Vaccine   Return in about 4 weeks (around 08/27/2020) for chronic disease f/u, covid vaccine #2.      I, Lavon Paganini, MD, have reviewed all documentation for this visit. The documentation on 07/30/20 for the exam, diagnosis, procedures, and orders are all accurate and complete.   Airen Stiehl, Dionne Bucy, MD, MPH [facility]

## 2020-07-31 ENCOUNTER — Telehealth: Payer: Self-pay

## 2020-07-31 LAB — CBC WITH DIFFERENTIAL/PLATELET
Basophils Absolute: 0 10*3/uL (ref 0.0–0.2)
Basos: 0 %
EOS (ABSOLUTE): 0.1 10*3/uL (ref 0.0–0.4)
Eos: 2 %
Hematocrit: 37.7 % (ref 34.0–46.6)
Hemoglobin: 12.5 g/dL (ref 11.1–15.9)
Immature Grans (Abs): 0 10*3/uL (ref 0.0–0.1)
Immature Granulocytes: 0 %
Lymphocytes Absolute: 1 10*3/uL (ref 0.7–3.1)
Lymphs: 17 %
MCH: 31.7 pg (ref 26.6–33.0)
MCHC: 33.2 g/dL (ref 31.5–35.7)
MCV: 96 fL (ref 79–97)
Monocytes Absolute: 0.5 10*3/uL (ref 0.1–0.9)
Monocytes: 9 %
Neutrophils Absolute: 4.3 10*3/uL (ref 1.4–7.0)
Neutrophils: 72 %
Platelets: 149 10*3/uL — ABNORMAL LOW (ref 150–450)
RBC: 3.94 x10E6/uL (ref 3.77–5.28)
RDW: 16.6 % — ABNORMAL HIGH (ref 11.7–15.4)
WBC: 6 10*3/uL (ref 3.4–10.8)

## 2020-07-31 NOTE — Telephone Encounter (Signed)
Tried calling; pt's mailbox is full.  PEC please advise pt of lab results if she calls back.   Thanks,   -[name]

## 2020-07-31 NOTE — Telephone Encounter (Signed)
-----   Message from [name], MD sent at 07/31/2020 10:08 AM EST ----- Blood counts improving

## 2020-08-01 DIAGNOSIS — Z7982 Long term (current) use of aspirin: Secondary | ICD-10-CM | POA: Diagnosis not present

## 2020-08-01 DIAGNOSIS — J449 Chronic obstructive pulmonary disease, unspecified: Secondary | ICD-10-CM | POA: Diagnosis not present

## 2020-08-01 DIAGNOSIS — L409 Psoriasis, unspecified: Secondary | ICD-10-CM | POA: Diagnosis not present

## 2020-08-01 DIAGNOSIS — E785 Hyperlipidemia, unspecified: Secondary | ICD-10-CM | POA: Diagnosis not present

## 2020-08-01 DIAGNOSIS — K219 Gastro-esophageal reflux disease without esophagitis: Secondary | ICD-10-CM | POA: Diagnosis not present

## 2020-08-01 DIAGNOSIS — F17210 Nicotine dependence, cigarettes, uncomplicated: Secondary | ICD-10-CM | POA: Diagnosis not present

## 2020-08-01 DIAGNOSIS — I130 Hypertensive heart and chronic kidney disease with heart failure and stage 1 through stage 4 chronic kidney disease, or unspecified chronic kidney disease: Secondary | ICD-10-CM | POA: Diagnosis not present

## 2020-08-01 DIAGNOSIS — I82442 Acute embolism and thrombosis of left tibial vein: Secondary | ICD-10-CM | POA: Diagnosis not present

## 2020-08-01 DIAGNOSIS — M6281 Muscle weakness (generalized): Secondary | ICD-10-CM | POA: Diagnosis not present

## 2020-08-01 DIAGNOSIS — I5042 Chronic combined systolic (congestive) and diastolic (congestive) heart failure: Secondary | ICD-10-CM | POA: Diagnosis not present

## 2020-08-01 DIAGNOSIS — Z7952 Long term (current) use of systemic steroids: Secondary | ICD-10-CM | POA: Diagnosis not present

## 2020-08-01 DIAGNOSIS — N183 Chronic kidney disease, stage 3 unspecified: Secondary | ICD-10-CM | POA: Diagnosis not present

## 2020-08-01 DIAGNOSIS — G2581 Restless legs syndrome: Secondary | ICD-10-CM | POA: Diagnosis not present

## 2020-08-01 DIAGNOSIS — S7011XD Contusion of right thigh, subsequent encounter: Secondary | ICD-10-CM | POA: Diagnosis not present

## 2020-08-01 DIAGNOSIS — D631 Anemia in chronic kidney disease: Secondary | ICD-10-CM | POA: Diagnosis not present

## 2020-08-01 DIAGNOSIS — I714 Abdominal aortic aneurysm, without rupture: Secondary | ICD-10-CM | POA: Diagnosis not present

## 2020-08-01 DIAGNOSIS — Z7951 Long term (current) use of inhaled steroids: Secondary | ICD-10-CM | POA: Diagnosis not present

## 2020-08-01 DIAGNOSIS — J9611 Chronic respiratory failure with hypoxia: Secondary | ICD-10-CM | POA: Diagnosis not present

## 2020-08-01 DIAGNOSIS — Z7901 Long term (current) use of anticoagulants: Secondary | ICD-10-CM | POA: Diagnosis not present

## 2020-08-01 DIAGNOSIS — E43 Unspecified severe protein-calorie malnutrition: Secondary | ICD-10-CM | POA: Diagnosis not present

## 2020-08-01 DIAGNOSIS — S62636D Displaced fracture of distal phalanx of right little finger, subsequent encounter for fracture with routine healing: Secondary | ICD-10-CM | POA: Diagnosis not present

## 2020-08-01 DIAGNOSIS — E559 Vitamin D deficiency, unspecified: Secondary | ICD-10-CM | POA: Diagnosis not present

## 2020-08-01 DIAGNOSIS — M5416 Radiculopathy, lumbar region: Secondary | ICD-10-CM | POA: Diagnosis not present

## 2020-08-01 DIAGNOSIS — M1990 Unspecified osteoarthritis, unspecified site: Secondary | ICD-10-CM | POA: Diagnosis not present

## 2020-08-01 DIAGNOSIS — F0150 Vascular dementia without behavioral disturbance: Secondary | ICD-10-CM | POA: Diagnosis not present

## 2020-08-01 NOTE — Progress Notes (Unsigned)
Cardiology Office Note  Date:  08/02/2020   ID:  Mariah [name], DOB 1946-06-27, MRN [id number]  PCP:  [name], MD   Chief Complaint  Patient presents with  . Other    1 month follow up. Meds reviewed verbally with patient.     HPI:  [name] is a 75 year old woman with past medical history of COPD, smoker Anemia DVT October 2021 on Eliquis Chronic kidney disease AAA Who presents for  chronic diastolic CHF, tricuspid valve regurgitation  Presents today with her daughter Last seen in clinic June 24, 2020 Seen in the emergency room July 03, 2020 for COPD exacerbation   admission to hospital November 2021 COPD exacerbation, right-sided pleural effusion Treated with Levaquin, also Lasix  Hospital admission May 26, 2020 for anemia hemoglobin 6.8, large right thigh hematoma after fall Eliquis aspirin held Also with left pleural effusion at the time receiving IV Lasix  Readmitted to the hospital June 02, 2020 worsening pleural effusion Underwent thoracentesis 600 cc out  Echocardiogram November 2021 ejection fraction 60 to 65% Normal RV function, moderately elevated right heart pressures, estimated 49 mmHg plus right atrial pressure, roughly 55 mmHg Question of pleural effusion Mild to moderate TR  Echocardiogram Normal ejection fraction 60%, moderately elevated right heart pressures  No edema on today's visit On lasix 40 daily, reports that she feels relatively stable Denies excessive fluid intake  Continues to smoke  EKG personally reviewed by myself on todays visit Shows normal sinus rhythm rate 85 bpm consider    PMH:   has a past medical history of Abdominal aortic aneurysm (AAA) ([place]), Arthritis, COPD (chronic obstructive pulmonary disease) ([place]), GERD (gastroesophageal reflux disease), Headache, History of adenomatous polyp of colon, Hyperlipidemia, Hypertension, Psoriasis, Right lumbar radiculitis, and Vitamin D  deficiency.  PSH:    Past Surgical History:  Procedure Laterality Date  . APPENDECTOMY  2000  . BREAST BIOPSY Right 09/2016   radial scar;COMPLEX SCLEROSING LESION  . BREAST LUMPECTOMY Right 11/24/2016   COMPLEX SCLEROSING LESION  . BREAST LUMPECTOMY WITH NEEDLE LOCALIZATION Right 11/24/2016   Procedure: BREAST LUMPECTOMY WITH NEEDLE LOCALIZATION;  Surgeon: Leonie Green, MD;  Location: [facility] ORS;  Service: General;  Laterality: Right;  . COLONOSCOPY WITH PROPOFOL N/A 08/10/2016   Procedure: COLONOSCOPY WITH PROPOFOL;  Surgeon: Manya Silvas, MD;  Location[facility] ENDOSCOPY;  Service: Endoscopy;  Laterality: N/A;  . ESOPHAGOGASTRODUODENOSCOPY (EGD) WITH PROPOFOL N/A 08/10/2016   Procedure: ESOPHAGOGASTRODUODENOSCOPY (EGD) WITH PROPOFOL;  Surgeon: Manya Silvas, MD;  Location: [facility] ENDOSCOPY;  Service: Endoscopy;  Laterality: N/A;  . FOOT SURGERY Bilateral   . JOINT REPLACEMENT Right 01/27/2016   hip  . 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 TOTAL HIP ARTHROPLASTY Right 01/27/2016   Procedure: TOTAL HIP ARTHROPLASTY;  Surgeon: Dereck Leep, MD;  Location: [facility] ORS;  Service: Orthopedics;  Laterality: Right;    Current Outpatient Medications  Medication Sig Dispense Refill  . acetaminophen (TYLENOL) 325 MG tablet Take 2 tablets (650 mg total) by mouth every 6 (six) hours as needed for mild pain (or Fever >/= 101).    [name] albuterol (VENTOLIN HFA) 108 (90 Base) MCG/ACT inhaler INHALE 1-2 PUFFS INTO THE LUNGS EVERY 6 HOURS AS NEEDED FOR WHEEZING OR SHORTNESS OF BREATH. 8 g 0  . apixaban (ELIQUIS) 5 MG TABS tablet Take 1 tablet (5 mg total) by mouth 2 (two) times daily. 60 tablet 3  . ascorbic acid (VITAMIN C) 250 MG tablet Take 1 tablet (  250 mg total) by mouth 2 (two) times daily.    [name] aspirin EC 81 MG tablet Take 81 mg by mouth daily. Swallow whole.    . betamethasone dipropionate (DIPROLENE) 0.05 % ointment Apply topically daily. 45 g 0  . calcipotriene (DOVONOX) 0.005 % cream APPLY TO  SKIN ONCE A DAY APPLY DAILY ON HANDS, FEET AND WRIST X 2 WEEKS, THEN USE 5 DAYS A WEEK 60 g 0  . Cholecalciferol (VITAMIN D3) 2000 units TABS Take 2,000 Units by mouth daily.    . clobetasol (TEMOVATE) 0.05 % external solution APPLY TO AFFECTED AREA TWICE A DAY 50 mL 2  . COSENTYX SENSOREADY, 300 MG, 150 MG/ML SOAJ Inject 2 Syringes into the skin every 28 (twenty-eight) days.    [name] donepezil (ARICEPT) 10 MG tablet Take 10 mg by mouth daily.    . ferrous [id number] C-folic acid (TRINSICON / FOLTRIN) capsule Take 1 capsule by mouth 2 (two) times daily after a meal. 60 capsule 3  . ferrous sulfate 325 (65 FE) MG tablet Take 325 mg by mouth 2 (two) times daily.    . fluticasone furoate-vilanterol (BREO ELLIPTA) 100-25 MCG/INH AEPB Inhale 1 puff into the lungs daily. 60 each 0  . furosemide (LASIX) 40 MG tablet Take 1 tablet (40 mg total) by mouth daily. If weight 127 lb or less, take 20 mg by mouth daily. 90 tablet 3  . gabapentin (NEURONTIN) 300 MG capsule TAKE 1 CAPSULE BY MOUTH TWICE A DAY 180 capsule 1  . guaiFENesin (MUCINEX) 600 MG 12 hr tablet Take 1 tablet (600 mg total) by mouth 2 (two) times daily.    [name] levocetirizine (XYZAL) 5 MG tablet TAKE 1 TABLET BY MOUTH EVERY DAY IN THE EVENING 90 tablet 4  . methotrexate (RHEUMATREX) 2.5 MG tablet Take 15 mg by mouth once a week.     . metoprolol succinate (TOPROL-XL) 50 MG 24 hr tablet TAKE 1 TABLET (50 MG TOTAL) BY MOUTH DAILY. TAKE WITH OR IMMEDIATELY FOLLOWING A MEAL. 90 tablet 0  . nicotine (NICODERM CQ - DOSED IN MG/24 HOURS) 21 mg/24hr patch Place 1 patch (21 mg total) onto the skin daily. 90 patch 3  . pantoprazole (PROTONIX) 20 MG tablet TAKE 1 TABLET (20 MG TOTAL) BY MOUTH 2 (TWO) TIMES DAILY BEFORE A MEAL. 180 tablet 1  . potassium chloride SA (KLOR-CON) 20 MEQ tablet Take 2 tablets (40 mEq total) by mouth daily. 5 tablet   . rOPINIRole (REQUIP) 1 MG tablet TAKE 1 TABLET (1 MG TOTAL) BY MOUTH AT BEDTIME. 90 tablet 1  . simvastatin  (ZOCOR) 20 MG tablet TAKE 1 TABLET BY MOUTH EVERY DAY 90 tablet 3  . traMADol (ULTRAM) 50 MG tablet TAKE 1 TABLET BY MOUTH EVERY 6 HOURS AS NEEDED FOR MODERATE OR SEVERE PAIN 100 tablet 2   No current facility-administered medications for this visit.     Allergies:   Clindamycin/lincomycin and Amoxicillin   Social History:  The patient  reports that she has been smoking. She has a 60.00 pack-year smoking history. She has never used smokeless tobacco. She reports current alcohol use. She reports that she does not use drugs.   Family History:   family history includes Arthritis in her father; COPD in her sister; Cancer in her mother; Colon cancer in her paternal aunt, paternal grandmother, and paternal uncle; Fibromyalgia in her sister; Heart disease in her father; Osteoarthritis in her maternal aunt and sister.    Review of Systems: Review of Systems  Constitutional: Negative.   HENT: Negative.   Respiratory: Negative.   Cardiovascular: Negative.   Gastrointestinal: Negative.   Musculoskeletal: Negative.   Neurological: Negative.   Psychiatric/Behavioral: Negative.   All other systems reviewed and are negative.   PHYSICAL EXAM: VS:  BP 106/72 (BP Location: Left Arm, Patient Position: Sitting, Cuff Size: Normal)   Pulse 85   Ht 5\' 1"  (1.549 m)   Wt 125 lb (56.7 kg)   BMI 23.62 kg/m  , BMI Body mass index is 23.62 kg/m. GEN: Well nourished, well developed, in no acute distress presents in a wheelchair HEENT: normal Neck: no JVD, carotid bruits, or masses Cardiac: RRR; no murmurs, rubs, or gallops,no edema  Respiratory:  clear to auscultation bilaterally, normal work of breathing GI: soft, nontender, nondistended, + BS MS: no deformity or atrophy Skin: warm and dry, no rash Neuro:  Strength and sensation are intact Psych: euthymic mood, full affect  Recent Labs: 06/03/2020: TSH 3.167 06/06/2020: Magnesium 2.0 07/03/2020: B Natriuretic Peptide 214.4 07/08/2020: ALT  14 07/10/2020: BUN 21; Creatinine, Ser 0.81; Potassium 3.9; Sodium 137 07/30/2020: Hemoglobin 12.5; Platelets 149    Lipid Panel Lab Results  Component Value Date   CHOL 163 08/08/2019   HDL 53 08/08/2019   LDLCALC 85 08/08/2019   TRIG 146 08/08/2019      Wt Readings from Last 3 Encounters:  08/02/20 125 lb (56.7 kg)  07/30/20 127 lb 12.8 oz (58 kg)  07/09/20 125 lb 10.6 oz (57 kg)     ASSESSMENT AND PLAN:  Problem List Items Addressed This Visit      Cardiology Problems   Peripheral arterial disease ([place]) - Primary   Congestive heart failure (HCC)   Essential (primary) hypertension   Hypercholesteremia   Abdominal aortic aneurysm (AAA) ([place])    Other Visit Diagnoses    Pulmonary hypertension, unspecified (HCC)         Chronic diastolic CHF/pulmonary hypertension Discussed recent and numerous hospitalizations for fluid overload, hypervolemia -Prior noncompliance with her Lasix -Now taking Lasix 40 daily Clinical exam does not suggest recurrent pleural effusion Daughter here for discussions today, recommended she stay on current dose of Lasix with extra Lasix after lunch for ANY ankle swelling concerning for fluid retention  Essential hypertension Blood pressure is well controlled on today's visit. No changes made to the medications.  COPD/chronic hypoxic respiratory failure Underlying COPD, pulmonary hypertension, Smoking cessation recommended  DVT Currently on Eliquis 5 twice daily   Total encounter time more than 45 minutes  Greater than 50% was spent in counseling and coordination of care with the patient    Signed, Esmond Plants, M.D., Ph.D. [place], [place]

## 2020-08-02 ENCOUNTER — Other Ambulatory Visit: Payer: Self-pay

## 2020-08-02 ENCOUNTER — Encounter: Payer: Self-pay | Admitting: Cardiovascular Disease

## 2020-08-02 ENCOUNTER — Ambulatory Visit (INDEPENDENT_AMBULATORY_CARE_PROVIDER_SITE_OTHER): Payer: PPO | Admitting: Cardiovascular Disease

## 2020-08-02 VITALS — BP 106/72 | HR 85 | Ht 61.0 in | Wt 125.0 lb

## 2020-08-02 DIAGNOSIS — E7800 Pure hypercholesterolemia, unspecified: Secondary | ICD-10-CM

## 2020-08-02 DIAGNOSIS — I714 Abdominal aortic aneurysm, without rupture, unspecified: Secondary | ICD-10-CM

## 2020-08-02 DIAGNOSIS — I10 Essential (primary) hypertension: Secondary | ICD-10-CM

## 2020-08-02 DIAGNOSIS — I5032 Chronic diastolic (congestive) heart failure: Secondary | ICD-10-CM | POA: Diagnosis not present

## 2020-08-02 DIAGNOSIS — I272 Pulmonary hypertension, unspecified: Secondary | ICD-10-CM

## 2020-08-02 DIAGNOSIS — I739 Peripheral vascular disease, unspecified: Secondary | ICD-10-CM | POA: Diagnosis not present

## 2020-08-02 NOTE — Patient Instructions (Addendum)
Medication Instructions:  Continue on Lasix 40 daily, extra Lasix after lunch for any ankle swelling  If you need a refill on your cardiac medications before your next appointment, please call your pharmacy.    Lab work: No new labs needed   If you have labs (blood work) drawn today and your tests are completely normal, you will receive your results only by: [name] MyChart Message (if you have MyChart) OR . A paper copy in the mail If you have any lab test that is abnormal or we need to change your treatment, we will call you to review the results.   Testing/Procedures: No new testing needed   Follow-Up: At [facility], you and your health needs are our priority.  As part of our continuing mission to provide you with exceptional heart care, we have created designated Provider Care Teams.  These Care Teams include your primary Cardiologist (physician) and Advanced Practice Providers (APPs -  Physician Assistants and Nurse Practitioners) who all work together to provide you with the care you need, when you need it.  . You will need a follow up appointment in 6 months  . Providers on your designated Care Team:   . Murray Hodgkins, NP . Christell Faith, PA-C . Marrianne Mood, PA-C  Any Other Special Instructions Will Be Listed Below (If Applicable).  COVID-19 Vaccine Information can be found at: ShippingScam.co.uk For questions related to vaccine distribution or appointments, please email vaccine@[email or web address] [email or web address] or call 9092711018.

## 2020-08-06 ENCOUNTER — Ambulatory Visit: Payer: PPO | Admitting: Family Medicine

## 2020-08-08 ENCOUNTER — Telehealth: Payer: Self-pay | Admitting: *Deleted

## 2020-08-08 NOTE — Chronic Care Management (AMB) (Signed)
  Chronic Care Management   Outreach Note  08/08/2020 Name: Mariah [name] MRN: [id number] DOB: 1945/09/24  Mariah [name] is a 75 y.o. year old female who is a primary care patient of Brita Romp, Dionne Bucy, MD. I reached out to Jackolyn Confer by phone today in response to a referral sent by [name]'s health plan.     An unsuccessful telephone outreach was attempted today. The patient was referred to the case management team for assistance with care management and care coordination.   Follow Up Plan: A HIPAA compliant phone message was left for the patient providing contact information and requesting a return call. The care management team will reach out to the patient again over the next 7 days. If patient returns call to provider office, please advise to call [place] at [phone].  [place] [facility]

## 2020-08-13 NOTE — Chronic Care Management (AMB) (Signed)
  Chronic Care Management   Note  08/13/2020 Name: ABBIGAL RADICH MRN: [id number] DOB: Apr 12, 1946  RUDY DOMEK is a 76 y.o. year old female who is a primary care patient of Brita Romp, Dionne Bucy, MD. I reached out to Jackolyn Confer by phone today in response to a referral sent by Ms. Audry Riles Nie's health plan.     Ms. Harvie was given information about Chronic Care Management services today including:  1. CCM service includes personalized support from designated clinical staff supervised by her physician, including individualized plan of care and coordination with other care providers 2. 24/7 contact phone numbers for assistance for urgent and routine care needs. 3. Service will only be billed when office clinical staff spend 20 minutes or more in a month to coordinate care. 4. Only one practitioner may furnish and bill the service in a calendar month. 5. The patient may stop CCM services at any time (effective at the end of the month) by phone call to the office staff. 6. The patient will be responsible for cost sharing (co-pay) of up to 20% of the service fee (after annual deductible is met).  Patient agreed to services and verbal consent obtained.   Follow up plan: Telephone appointment with care management team member scheduled for:09/05/2020  [place] [facility]

## 2020-08-14 ENCOUNTER — Telehealth: Payer: Self-pay | Admitting: Family Medicine

## 2020-08-14 NOTE — Telephone Encounter (Signed)
Copied from [place] [id number]. Topic: Medicare AWV >> Aug 14, 2020  3:01 PM Cher Nakai R wrote: Reason for CRM:   No answer unable to leave a message for patient to call back and schedule Medicare Annual Wellness Visit (AWV) in office.   If not able to come in office, please offer to do virtually or by telephone.   Last AWV 07/10/2019  Please schedule at anytime with [facility] Health Advisor.  If any questions, please contact me at [phone]

## 2020-08-20 ENCOUNTER — Encounter: Payer: Self-pay | Admitting: Family Medicine

## 2020-08-20 ENCOUNTER — Ambulatory Visit (INDEPENDENT_AMBULATORY_CARE_PROVIDER_SITE_OTHER): Payer: PPO | Admitting: Family Medicine

## 2020-08-20 ENCOUNTER — Other Ambulatory Visit: Payer: Self-pay

## 2020-08-20 VITALS — BP 136/86 | Temp 97.8°F | Resp 16 | Wt 126.7 lb

## 2020-08-20 DIAGNOSIS — D692 Other nonthrombocytopenic purpura: Secondary | ICD-10-CM | POA: Diagnosis not present

## 2020-08-20 DIAGNOSIS — I10 Essential (primary) hypertension: Secondary | ICD-10-CM | POA: Diagnosis not present

## 2020-08-20 DIAGNOSIS — I739 Peripheral vascular disease, unspecified: Secondary | ICD-10-CM | POA: Diagnosis not present

## 2020-08-20 DIAGNOSIS — E7800 Pure hypercholesterolemia, unspecified: Secondary | ICD-10-CM

## 2020-08-20 DIAGNOSIS — J9601 Acute respiratory failure with hypoxia: Secondary | ICD-10-CM | POA: Diagnosis not present

## 2020-08-20 DIAGNOSIS — L4050 Arthropathic psoriasis, unspecified: Secondary | ICD-10-CM

## 2020-08-20 DIAGNOSIS — Z23 Encounter for immunization: Secondary | ICD-10-CM | POA: Diagnosis not present

## 2020-08-20 NOTE — Progress Notes (Signed)
Established patient visit   Patient: Mariah [name]   DOB: 05/11/1946   75 y.o. Female  MRN: [id number] Visit Date: 08/20/2020  Today's healthcare provider: Lavon Paganini, MD   Chief Complaint  Patient presents with   Follow-up   Subjective    HPI  Follow up for acute respiratory failure with hypoxia  The patient was last seen for this 3 weeks ago. Changes made at last visit include no changes.  She reports excellent compliance with treatment. She feels that condition is Improved. She is not having side effects. No longer on home O2. Unable to get pulse ox.  -----------------------------------------------------------------------------------------    Social History   Tobacco Use   Smoking status: Current Every Day Smoker    Packs/day: 1.50    Years: 40.00    Pack years: 60.00   Smokeless tobacco: Never Used   Tobacco comment: 0.5 PPD 06/19/2020  Vaping Use   Vaping Use: Some days  Substance Use Topics   Alcohol use: Yes    Comment: occasionally glass of wine   Drug use: No       Medications: Outpatient Medications Prior to Visit  Medication Sig   albuterol (VENTOLIN HFA) 108 (90 Base) MCG/ACT inhaler INHALE 1-2 PUFFS INTO THE LUNGS EVERY 6 HOURS AS NEEDED FOR WHEEZING OR SHORTNESS OF BREATH.   apixaban (ELIQUIS) 5 MG TABS tablet Take 1 tablet (5 mg total) by mouth 2 (two) times daily.   aspirin EC 81 MG tablet Take 81 mg by mouth daily. Swallow whole.   betamethasone dipropionate (DIPROLENE) 0.05 % ointment Apply topically daily.   calcipotriene (DOVONOX) 0.005 % cream APPLY TO SKIN ONCE A DAY APPLY DAILY ON HANDS, FEET AND WRIST X 2 WEEKS, THEN USE 5 DAYS A WEEK   Cholecalciferol (VITAMIN D3) 2000 units TABS Take 2,000 Units by mouth daily.   clobetasol (TEMOVATE) 0.05 % external solution APPLY TO AFFECTED AREA TWICE A DAY   COSENTYX SENSOREADY, 300 MG, 150 MG/ML SOAJ Inject 2 Syringes into the skin every 28 (twenty-eight) days.    donepezil (ARICEPT) 10 MG tablet Take 10 mg by mouth daily.   ferrous [id number] C-folic acid (TRINSICON / FOLTRIN) capsule Take 1 capsule by mouth 2 (two) times daily after a meal.   ferrous sulfate 325 (65 FE) MG tablet Take 325 mg by mouth 2 (two) times daily.   fluticasone furoate-vilanterol (BREO ELLIPTA) 100-25 MCG/INH AEPB Inhale 1 puff into the lungs daily.   furosemide (LASIX) 40 MG tablet Take 1 tablet (40 mg total) by mouth daily. If weight 127 lb or less, take 20 mg by mouth daily.   gabapentin (NEURONTIN) 300 MG capsule TAKE 1 CAPSULE BY MOUTH TWICE A DAY   guaiFENesin (MUCINEX) 600 MG 12 hr tablet Take 1 tablet (600 mg total) by mouth 2 (two) times daily.   levocetirizine (XYZAL) 5 MG tablet TAKE 1 TABLET BY MOUTH EVERY DAY IN THE EVENING   methotrexate (RHEUMATREX) 2.5 MG tablet Take 15 mg by mouth once a week.    metoprolol succinate (TOPROL-XL) 50 MG 24 hr tablet TAKE 1 TABLET (50 MG TOTAL) BY MOUTH DAILY. TAKE WITH OR IMMEDIATELY FOLLOWING A MEAL.   pantoprazole (PROTONIX) 20 MG tablet TAKE 1 TABLET (20 MG TOTAL) BY MOUTH 2 (TWO) TIMES DAILY BEFORE A MEAL.   potassium chloride SA (KLOR-CON) 20 MEQ tablet Take 2 tablets (40 mEq total) by mouth daily.   rOPINIRole (REQUIP) 1 MG tablet TAKE 1 TABLET (1 MG TOTAL) BY  MOUTH AT BEDTIME.   simvastatin (ZOCOR) 20 MG tablet TAKE 1 TABLET BY MOUTH EVERY DAY   traMADol (ULTRAM) 50 MG tablet TAKE 1 TABLET BY MOUTH EVERY 6 HOURS AS NEEDED FOR MODERATE OR SEVERE PAIN   [DISCONTINUED] acetaminophen (TYLENOL) 325 MG tablet Take 2 tablets (650 mg total) by mouth every 6 (six) hours as needed for mild pain (or Fever >/= 101).   [DISCONTINUED] ascorbic acid (VITAMIN C) 250 MG tablet Take 1 tablet (250 mg total) by mouth 2 (two) times daily.   [DISCONTINUED] nicotine (NICODERM CQ - DOSED IN MG/24 HOURS) 21 mg/24hr patch Place 1 patch (21 mg total) onto the skin daily. (Patient not taking: Reported on 08/20/2020)   No  facility-administered medications prior to visit.    Review of Systems      Objective    BP 136/86 (BP Location: Left Arm, Patient Position: Sitting, Cuff Size: Large)    Temp 97.8 F (36.6 C) (Oral)    Resp 16    Wt 126 lb 11.2 oz (57.5 kg)    BMI 23.94 kg/m    Physical Exam Constitutional:      General: She is not in acute distress.    Appearance: Normal appearance.  HENT:     Head: Normocephalic.  Eyes:     Conjunctiva/sclera: Conjunctivae normal.  Cardiovascular:     Rate and Rhythm: Normal rate and regular rhythm.     Heart sounds: Normal heart sounds. No murmur heard.   Pulmonary:     Effort: Pulmonary effort is normal. No respiratory distress.     Breath sounds: Normal breath sounds. No wheezing.  Abdominal:     General: There is no distension.     Palpations: Abdomen is soft.     Tenderness: There is no abdominal tenderness.  Neurological:     Mental Status: She is alert. Mental status is at baseline.  Psychiatric:        Mood and Affect: Mood normal.       Results for orders placed or performed in visit on 08/20/20  Lipid panel  Result Value Ref Range   Cholesterol, Total 166 100 - 199 mg/dL   Triglycerides 141 0 - 149 mg/dL   HDL 56 >39 mg/dL   VLDL Cholesterol Cal 24 5 - 40 mg/dL   LDL Chol Calc (NIH) 86 0 - 99 mg/dL   Chol/HDL Ratio 3.0 0.0 - 4.4 ratio  Comprehensive metabolic panel  Result Value Ref Range   Glucose 90 65 - 99 mg/dL   BUN 10 8 - 27 mg/dL   Creatinine, Ser 1.15 (H) 0.57 - 1.00 mg/dL   GFR calc non Af Amer 47 (L) >59 mL/min/1.73   GFR calc Af Amer 54 (L) >59 mL/min/1.73   BUN/Creatinine Ratio 9 (L) 12 - 28   Sodium 139 134 - 144 mmol/L   Potassium 4.3 3.5 - 5.2 mmol/L   Chloride 96 96 - 106 mmol/L   CO2 29 20 - 29 mmol/L   Calcium 9.1 8.7 - 10.3 mg/dL   Total Protein 6.6 6.0 - 8.5 g/dL   Albumin 4.2 3.7 - 4.7 g/dL   Globulin, Total 2.4 1.5 - 4.5 g/dL   Albumin/Globulin Ratio 1.8 1.2 - 2.2   Bilirubin Total 0.7 0.0 - 1.2  mg/dL   Alkaline Phosphatase 135 (H) 44 - 121 IU/L   AST 21 0 - 40 IU/L   ALT 20 0 - 32 IU/L    Assessment & Plan  Problem List Items Addressed This Visit      Cardiovascular and Mediastinum   Essential (primary) hypertension - Primary    Well controlled Continue current medications Recheck metabolic panel F/u in 3-6 months       Relevant Orders   Comprehensive metabolic panel (Completed)   Senile purpura (HCC)    Chronic and stable Continue to monitor      Peripheral arterial disease (HCC)    No current limb threatening symptoms Followed by vascular surgery Important to stop smoking Important to manage blood pressure and other risk factors        Respiratory   Acute respiratory failure with hypoxia (HCC)    Improved and no longer on home O2 Related to recent hospitalization for COPD exacerbation and CAP Unable to get pulse ox in clinic to read at all DME prescription given for home pulse ox        Musculoskeletal and Integument   Psoriatic arthritis ([place])    Follow-up with rheumatology On cosentyx        Other   Hypercholesteremia    Continue simvastatin Recheck CMP and lipid panel      Relevant Orders   Lipid panel (Completed)   Comprehensive metabolic panel (Completed)    Other Visit Diagnoses    Encounter for immunization       Relevant Orders   Pfizer SARS-COV-2 Vaccine (Completed)       Return in about 3 months (around 11/17/2020) for CPE, AWV and 3rd COVID vaccine in 1 month (immunosuppressed).      I, Lavon Paganini, MD, have reviewed all documentation for this visit. The documentation on 08/21/20 for the exam, diagnosis, procedures, and orders are all accurate and complete.   Alden Bensinger, Dionne Bucy, MD, MPH [facility]

## 2020-08-21 LAB — COMPREHENSIVE METABOLIC PANEL
ALT: 20 IU/L (ref 0–32)
AST: 21 IU/L (ref 0–40)
Albumin/Globulin Ratio: 1.8 (ref 1.2–2.2)
Albumin: 4.2 g/dL (ref 3.7–4.7)
Alkaline Phosphatase: 135 IU/L — ABNORMAL HIGH (ref 44–121)
BUN/Creatinine Ratio: 9 — ABNORMAL LOW (ref 12–28)
BUN: 10 mg/dL (ref 8–27)
Bilirubin Total: 0.7 mg/dL (ref 0.0–1.2)
CO2: 29 mmol/L (ref 20–29)
Calcium: 9.1 mg/dL (ref 8.7–10.3)
Chloride: 96 mmol/L (ref 96–106)
Creatinine, Ser: 1.15 mg/dL — ABNORMAL HIGH (ref 0.57–1.00)
GFR calc Af Amer: 54 mL/min/{1.73_m2} — ABNORMAL LOW (ref >59)
GFR calc non Af Amer: 47 mL/min/{1.73_m2} — ABNORMAL LOW (ref >59)
Globulin, Total: 2.4 g/dL (ref 1.5–4.5)
Glucose: 90 mg/dL (ref 65–99)
Potassium: 4.3 mmol/L (ref 3.5–5.2)
Sodium: 139 mmol/L (ref 134–144)
Total Protein: 6.6 g/dL (ref 6.0–8.5)

## 2020-08-21 LAB — LIPID PANEL
Chol/HDL Ratio: 3 ratio (ref 0.0–4.4)
Cholesterol, Total: 166 mg/dL (ref 100–199)
HDL: 56 mg/dL (ref >39)
LDL Chol Calc (NIH): 86 mg/dL (ref 0–99)
Triglycerides: 141 mg/dL (ref 0–149)
VLDL Cholesterol Cal: 24 mg/dL (ref 5–40)

## 2020-08-21 NOTE — Assessment & Plan Note (Signed)
Well controlled Continue current medications Recheck metabolic panel F/u in 3-6 months  

## 2020-08-21 NOTE — Assessment & Plan Note (Addendum)
Follow-up with rheumatology On cosentyx

## 2020-08-21 NOTE — Assessment & Plan Note (Signed)
Chronic and stable Continue to monitor 

## 2020-08-21 NOTE — Assessment & Plan Note (Signed)
No current limb threatening symptoms Followed by vascular surgery Important to stop smoking Important to manage blood pressure and other risk factors

## 2020-08-21 NOTE — Assessment & Plan Note (Signed)
Improved and no longer on home O2 Related to recent hospitalization for COPD exacerbation and CAP Unable to get pulse ox in clinic to read at all DME prescription given for home pulse ox

## 2020-08-21 NOTE — Assessment & Plan Note (Signed)
Continue simvastatin Recheck CMP and lipid panel

## 2020-08-22 ENCOUNTER — Telehealth: Payer: Self-pay

## 2020-08-22 NOTE — Telephone Encounter (Signed)
-----   Message from [name], MD sent at 08/21/2020  4:07 PM EST ----- Stable labs.  Kidney function is still within her normal range, but slightly decreased from what we have seen recently.  Make sure she is not getting too dehydrated.  Recheck at next visit.

## 2020-08-22 NOTE — Telephone Encounter (Signed)
Copied from [place] [phone]. Topic: General - Other >> Aug 22, 2020  3:51 PM Hinda Lenis D wrote: PT need a callback from a nurse / personal / please advise

## 2020-08-22 NOTE — Telephone Encounter (Signed)
Pt called and verbalized understanding of information below. Pt said she was staying hydrated.

## 2020-08-23 NOTE — Telephone Encounter (Signed)
Spoke with Ms. Lavergne.  She states she does not need anything at the time.     Thanks,   -[name]

## 2020-09-01 ENCOUNTER — Other Ambulatory Visit: Payer: Self-pay | Admitting: Dermatology

## 2020-09-01 ENCOUNTER — Other Ambulatory Visit: Payer: Self-pay | Admitting: Physician Assistant

## 2020-09-01 ENCOUNTER — Other Ambulatory Visit: Payer: Self-pay | Admitting: Family Medicine

## 2020-09-01 DIAGNOSIS — J439 Emphysema, unspecified: Secondary | ICD-10-CM

## 2020-09-01 DIAGNOSIS — I82442 Acute embolism and thrombosis of left tibial vein: Secondary | ICD-10-CM

## 2020-09-03 ENCOUNTER — Other Ambulatory Visit: Payer: Self-pay | Admitting: Family Medicine

## 2020-09-04 ENCOUNTER — Telehealth: Payer: Self-pay

## 2020-09-04 ENCOUNTER — Ambulatory Visit: Payer: Self-pay | Admitting: *Deleted

## 2020-09-04 NOTE — Telephone Encounter (Signed)
Attempted x 2 by PEC NT, routing to office for resolution.   Summary: Dementia advice    PTs niece called in on her behalf because PT has dementia and was stating her legs do not work. PTs niece wanted some advice if this is normal or connected to having dementia. (Patient currently lives with her niece Mariah, [name] (Emergency Contact) [phone])

## 2020-09-04 NOTE — Telephone Encounter (Signed)
Copied from [place] [id number]. Topic: General - Other >> Sep 04, 2020  1:34 PM Pawlus, Brayton Layman A wrote: Reason for CRM: Pts niece called in with some questions regarding the PT, stated she really needs to talk to Dr B since the PT has dementia and is trying to get her drivers license back and needs Dr B to sign some documents.

## 2020-09-04 NOTE — Telephone Encounter (Signed)
Patient 's niece called to report patient is reporting her legs do not work. Niece , Jeanne Ivan is wondering if this could be from patient's dementia or if something else.  Attempted to call patient's niece. No answer, unable to leave voicemail. Mailbox full.

## 2020-09-05 ENCOUNTER — Ambulatory Visit: Payer: Self-pay | Admitting: *Deleted

## 2020-09-05 ENCOUNTER — Telehealth: Payer: Self-pay

## 2020-09-05 ENCOUNTER — Ambulatory Visit: Payer: Self-pay | Admitting: Adult Health

## 2020-09-05 ENCOUNTER — Telehealth: Payer: PPO

## 2020-09-05 DIAGNOSIS — N189 Chronic kidney disease, unspecified: Secondary | ICD-10-CM | POA: Diagnosis not present

## 2020-09-05 DIAGNOSIS — R29898 Other symptoms and signs involving the musculoskeletal system: Secondary | ICD-10-CM | POA: Diagnosis not present

## 2020-09-05 DIAGNOSIS — F0390 Unspecified dementia without behavioral disturbance: Secondary | ICD-10-CM | POA: Diagnosis not present

## 2020-09-05 DIAGNOSIS — M6281 Muscle weakness (generalized): Secondary | ICD-10-CM | POA: Diagnosis not present

## 2020-09-05 DIAGNOSIS — M25551 Pain in right hip: Secondary | ICD-10-CM | POA: Diagnosis not present

## 2020-09-05 DIAGNOSIS — J439 Emphysema, unspecified: Secondary | ICD-10-CM | POA: Diagnosis not present

## 2020-09-05 DIAGNOSIS — Z86718 Personal history of other venous thrombosis and embolism: Secondary | ICD-10-CM | POA: Diagnosis not present

## 2020-09-05 DIAGNOSIS — Z7901 Long term (current) use of anticoagulants: Secondary | ICD-10-CM | POA: Diagnosis not present

## 2020-09-05 DIAGNOSIS — F17210 Nicotine dependence, cigarettes, uncomplicated: Secondary | ICD-10-CM | POA: Diagnosis not present

## 2020-09-05 DIAGNOSIS — M549 Dorsalgia, unspecified: Secondary | ICD-10-CM | POA: Diagnosis not present

## 2020-09-05 NOTE — Telephone Encounter (Signed)
We can schedule a virtual visit, but would need to see the patient too.  Driver's license was taken due to dementia and her not being safe to drive independently

## 2020-09-05 NOTE — Telephone Encounter (Signed)
Dr B As an FYI please see other messages from today regarding this patient.  They have been closed out.

## 2020-09-05 NOTE — Telephone Encounter (Signed)
Patient's family was taking patient to the ER due to possible stroke

## 2020-09-05 NOTE — Telephone Encounter (Signed)
FYI

## 2020-09-05 NOTE — Telephone Encounter (Signed)
Please call patient's daughter and have her take Mrs. Didio to the emergency room now, triage note says # 4 What do you think is causing the weakness " think she had a stroke". She needs evaluation in the emergency room now.

## 2020-09-05 NOTE — Telephone Encounter (Signed)
Spoke with patients niece Robby Sermon and instructed her to take patient to the emergency room. KW

## 2020-09-05 NOTE — Telephone Encounter (Signed)
  Chronic Care Management   Outreach Note  09/05/2020 Name: Mariah [name] MRN: [id number] DOB: 1946-01-17  Primary Care Provider: [name], MD Reason for referral : Chronic Care Management  An unsuccessful telephone outreach was attempted today. [name] was referred to the case management team for assistance with care management and care coordination.     Follow Up Plan:  Unable to leave a voice message today due to [name]'s voice mailbox being full. A member of the care management team will reach out again within the next week.    [name] Care Management [facility] [phone]

## 2020-09-05 NOTE — Telephone Encounter (Signed)
Pt's niece, Maelie, called because the pt fell around midnight 09/03/20; the pt went to the bathroom and said her legs gave way; she crawled to the bed and called her niece; her BP was 95/78 at that time; the pt has to use her walker instead of her cane; recommendations made per nurse triage protocol; decision tree completed; the pt sees Dr Brita Romp, [facility], but she has no availability; pt's niece declined 1100 appt with Laverna Peace; she would like to come later; she was offered and accepted appt with Sharyn Lull flinchum at 81; will route to office for notification.  Reason for Disposition . [1] MODERATE weakness (i.e., interferes with work, school, normal activities) AND [2] cause unknown  (Exceptions: weakness with acute minor illness, or weakness from poor fluid intake)  Answer Assessment - Initial Assessment Questions 1. DESCRIPTION: "Describe how you are feeling."     Legs gave way 2. SEVERITY: "How bad is it?"  "Can you stand and walk?"   - MILD - Feels weak or tired, but does not interfere with work, school or normal activities   - [place] to stand and walk; weakness interferes with work, school, or normal activities   - SEVERE - Unable to stand or walk    moderate 3. ONSET:  "When did the weakness begin?"     09/03/20 4. CAUSE: "What do you think is causing the weakness?"     "think she had a stroke" 5. MEDICINES: "Have you recently started a new medicine or had a change in the amount of a medicine?"     no 6. OTHER SYMPTOMS: "Do you have any other symptoms?" (e.g., chest pain, fever, cough, SOB, vomiting, diarrhea, bleeding, other areas of pain)     no 7. PREGNANCY: "Is there any chance you are pregnant?" "When was your last menstrual period?"  Protocols used: WEAKNESS (GENERALIZED) AND FATIGUE-A-AH

## 2020-09-16 ENCOUNTER — Telehealth: Payer: Self-pay

## 2020-09-16 NOTE — Telephone Encounter (Signed)
  Chronic Care Management   Outreach Note  09/16/2020 Name: SAVILLA TURBYFILL MRN: [id number] DOB: 1946/01/04  Primary Care Provider: [name], MD Reason for referral : Chronic Care Management   An unsuccessful telephone outreach was attempted today. Ms. Calkin was referred to the case management team for assistance with care management and care coordination.    Follow Up Plan:  Unable to leave a voice message today due to Ms. Oddo's voice mailbox being full.  A member of the care management team will attempt to reach Ms. Barrientes again within the next two weeks.    [name] Care Management [facility] [phone]

## 2020-09-17 ENCOUNTER — Emergency Department: Payer: PPO

## 2020-09-17 ENCOUNTER — Observation Stay: Payer: PPO

## 2020-09-17 ENCOUNTER — Inpatient Hospital Stay
Admission: EM | Admit: 2020-09-17 | Discharge: 2020-09-20 | DRG: 312 | Disposition: A | Discharge status: Home Health | Payer: PPO | Attending: Internal Medicine | Admitting: Internal Medicine

## 2020-09-17 ENCOUNTER — Other Ambulatory Visit: Payer: Self-pay

## 2020-09-17 ENCOUNTER — Ambulatory Visit: Payer: Self-pay | Admitting: Family Medicine

## 2020-09-17 ENCOUNTER — Telehealth: Payer: Self-pay

## 2020-09-17 DIAGNOSIS — I5032 Chronic diastolic (congestive) heart failure: Secondary | ICD-10-CM | POA: Diagnosis present

## 2020-09-17 DIAGNOSIS — L4050 Arthropathic psoriasis, unspecified: Secondary | ICD-10-CM | POA: Diagnosis present

## 2020-09-17 DIAGNOSIS — I10 Essential (primary) hypertension: Secondary | ICD-10-CM | POA: Diagnosis not present

## 2020-09-17 DIAGNOSIS — I714 Abdominal aortic aneurysm, without rupture: Secondary | ICD-10-CM | POA: Diagnosis present

## 2020-09-17 DIAGNOSIS — Z20822 Contact with and (suspected) exposure to covid-19: Secondary | ICD-10-CM | POA: Diagnosis not present

## 2020-09-17 DIAGNOSIS — E785 Hyperlipidemia, unspecified: Secondary | ICD-10-CM | POA: Diagnosis not present

## 2020-09-17 DIAGNOSIS — I110 Hypertensive heart disease with heart failure: Secondary | ICD-10-CM | POA: Diagnosis present

## 2020-09-17 DIAGNOSIS — J189 Pneumonia, unspecified organism: Secondary | ICD-10-CM | POA: Diagnosis not present

## 2020-09-17 DIAGNOSIS — T451X5A Adverse effect of antineoplastic and immunosuppressive drugs, initial encounter: Secondary | ICD-10-CM | POA: Diagnosis not present

## 2020-09-17 DIAGNOSIS — Z7982 Long term (current) use of aspirin: Secondary | ICD-10-CM

## 2020-09-17 DIAGNOSIS — Z96641 Presence of right artificial hip joint: Secondary | ICD-10-CM | POA: Diagnosis not present

## 2020-09-17 DIAGNOSIS — J449 Chronic obstructive pulmonary disease, unspecified: Secondary | ICD-10-CM | POA: Diagnosis present

## 2020-09-17 DIAGNOSIS — E876 Hypokalemia: Secondary | ICD-10-CM | POA: Diagnosis present

## 2020-09-17 DIAGNOSIS — R55 Syncope and collapse: Secondary | ICD-10-CM | POA: Diagnosis present

## 2020-09-17 DIAGNOSIS — K121 Other forms of stomatitis: Secondary | ICD-10-CM | POA: Diagnosis present

## 2020-09-17 DIAGNOSIS — I6523 Occlusion and stenosis of bilateral carotid arteries: Secondary | ICD-10-CM | POA: Diagnosis not present

## 2020-09-17 DIAGNOSIS — D61811 Other drug-induced pancytopenia: Secondary | ICD-10-CM | POA: Diagnosis present

## 2020-09-17 DIAGNOSIS — S0993XA Unspecified injury of face, initial encounter: Secondary | ICD-10-CM | POA: Diagnosis not present

## 2020-09-17 DIAGNOSIS — R2681 Unsteadiness on feet: Secondary | ICD-10-CM

## 2020-09-17 DIAGNOSIS — Z880 Allergy status to penicillin: Secondary | ICD-10-CM

## 2020-09-17 DIAGNOSIS — I959 Hypotension, unspecified: Secondary | ICD-10-CM | POA: Diagnosis not present

## 2020-09-17 DIAGNOSIS — Y92009 Unspecified place in unspecified non-institutional (private) residence as the place of occurrence of the external cause: Secondary | ICD-10-CM | POA: Diagnosis not present

## 2020-09-17 DIAGNOSIS — K219 Gastro-esophageal reflux disease without esophagitis: Secondary | ICD-10-CM | POA: Diagnosis not present

## 2020-09-17 DIAGNOSIS — Z881 Allergy status to other antibiotic agents status: Secondary | ICD-10-CM

## 2020-09-17 DIAGNOSIS — F17210 Nicotine dependence, cigarettes, uncomplicated: Secondary | ICD-10-CM | POA: Diagnosis present

## 2020-09-17 DIAGNOSIS — Z8249 Family history of ischemic heart disease and other diseases of the circulatory system: Secondary | ICD-10-CM

## 2020-09-17 DIAGNOSIS — Z7951 Long term (current) use of inhaled steroids: Secondary | ICD-10-CM

## 2020-09-17 DIAGNOSIS — Z86718 Personal history of other venous thrombosis and embolism: Secondary | ICD-10-CM

## 2020-09-17 DIAGNOSIS — E878 Other disorders of electrolyte and fluid balance, not elsewhere classified: Secondary | ICD-10-CM | POA: Diagnosis not present

## 2020-09-17 DIAGNOSIS — G629 Polyneuropathy, unspecified: Secondary | ICD-10-CM | POA: Diagnosis present

## 2020-09-17 DIAGNOSIS — Z7901 Long term (current) use of anticoagulants: Secondary | ICD-10-CM

## 2020-09-17 DIAGNOSIS — Z720 Tobacco use: Secondary | ICD-10-CM | POA: Diagnosis not present

## 2020-09-17 DIAGNOSIS — E871 Hypo-osmolality and hyponatremia: Secondary | ICD-10-CM | POA: Diagnosis not present

## 2020-09-17 DIAGNOSIS — Z800 Family history of malignant neoplasm of digestive organs: Secondary | ICD-10-CM

## 2020-09-17 DIAGNOSIS — R0902 Hypoxemia: Secondary | ICD-10-CM | POA: Diagnosis not present

## 2020-09-17 DIAGNOSIS — W19XXXA Unspecified fall, initial encounter: Secondary | ICD-10-CM | POA: Diagnosis not present

## 2020-09-17 DIAGNOSIS — Z8261 Family history of arthritis: Secondary | ICD-10-CM

## 2020-09-17 DIAGNOSIS — D696 Thrombocytopenia, unspecified: Secondary | ICD-10-CM | POA: Diagnosis not present

## 2020-09-17 DIAGNOSIS — Z825 Family history of asthma and other chronic lower respiratory diseases: Secondary | ICD-10-CM

## 2020-09-17 DIAGNOSIS — I951 Orthostatic hypotension: Principal | ICD-10-CM | POA: Diagnosis present

## 2020-09-17 DIAGNOSIS — Z79899 Other long term (current) drug therapy: Secondary | ICD-10-CM

## 2020-09-17 DIAGNOSIS — S0990XA Unspecified injury of head, initial encounter: Secondary | ICD-10-CM | POA: Diagnosis not present

## 2020-09-17 DIAGNOSIS — F0390 Unspecified dementia without behavioral disturbance: Secondary | ICD-10-CM | POA: Diagnosis present

## 2020-09-17 DIAGNOSIS — D72819 Decreased white blood cell count, unspecified: Secondary | ICD-10-CM

## 2020-09-17 DIAGNOSIS — Z86010 Personal history of colonic polyps: Secondary | ICD-10-CM | POA: Diagnosis not present

## 2020-09-17 DIAGNOSIS — D61818 Other pancytopenia: Secondary | ICD-10-CM

## 2020-09-17 LAB — URINALYSIS, COMPLETE (UACMP) WITH MICROSCOPIC
Bacteria, UA: NONE SEEN
Bilirubin Urine: NEGATIVE
Glucose, UA: NEGATIVE mg/dL
Ketones, ur: NEGATIVE mg/dL
Leukocytes,Ua: NEGATIVE
Nitrite: NEGATIVE
Protein, ur: NEGATIVE mg/dL
Specific Gravity, Urine: 1.004 — ABNORMAL LOW (ref 1.005–1.030)
Squamous Epithelial / HPF: NONE SEEN (ref 0–5)
pH: 7 (ref 5.0–8.0)

## 2020-09-17 LAB — CBC
HCT: 29.6 % — ABNORMAL LOW (ref 36.0–46.0)
Hemoglobin: 10.7 g/dL — ABNORMAL LOW (ref 12.0–15.0)
MCH: 34.1 pg — ABNORMAL HIGH (ref 26.0–34.0)
MCHC: 36.1 g/dL — ABNORMAL HIGH (ref 30.0–36.0)
MCV: 94.3 fL (ref 80.0–100.0)
Platelets: 42 10*3/uL — ABNORMAL LOW (ref 150–400)
RBC: 3.14 MIL/uL — ABNORMAL LOW (ref 3.87–5.11)
RDW: 14.7 % (ref 11.5–15.5)
WBC: 5 10*3/uL (ref 4.0–10.5)
nRBC: 0 % (ref 0.0–0.2)

## 2020-09-17 LAB — BASIC METABOLIC PANEL
Anion gap: 10 (ref 5–15)
BUN: 10 mg/dL (ref 8–23)
CO2: 27 mmol/L (ref 22–32)
Calcium: 9.1 mg/dL (ref 8.9–10.3)
Chloride: 97 mmol/L — ABNORMAL LOW (ref 98–111)
Creatinine, Ser: 1.03 mg/dL — ABNORMAL HIGH (ref 0.44–1.00)
GFR, Estimated: 56 mL/min — ABNORMAL LOW (ref >60)
Glucose, Bld: 116 mg/dL — ABNORMAL HIGH (ref 70–99)
Potassium: 3 mmol/L — ABNORMAL LOW (ref 3.5–5.1)
Sodium: 134 mmol/L — ABNORMAL LOW (ref 135–145)

## 2020-09-17 LAB — MAGNESIUM: Magnesium: 2.2 mg/dL (ref 1.7–2.4)

## 2020-09-17 MED ORDER — METOPROLOL SUCCINATE ER 50 MG PO TB24
50.0000 mg | ORAL_TABLET | Freq: Every day | ORAL | Status: DC
  Administered 2020-09-18: 50 mg via ORAL
  Filled 2020-09-17: qty 1

## 2020-09-17 MED ORDER — METHOTREXATE 2.5 MG PO TABS: 15.0000 mg | ORAL_TABLET | ORAL | Status: DC

## 2020-09-17 MED ORDER — ACETAMINOPHEN 650 MG RE SUPP: 650.0000 mg | Freq: Four times a day (QID) | RECTAL | Status: DC | PRN

## 2020-09-17 MED ORDER — ONDANSETRON HCL 4 MG PO TABS: 4.0000 mg | ORAL_TABLET | Freq: Four times a day (QID) | ORAL | Status: DC | PRN

## 2020-09-17 MED ORDER — POTASSIUM CHLORIDE CRYS ER 20 MEQ PO TBCR
40.0000 meq | EXTENDED_RELEASE_TABLET | Freq: Every day | ORAL | Status: DC
  Administered 2020-09-17 – 2020-09-18 (×2): 40 meq via ORAL
  Filled 2020-09-17 (×2): qty 2

## 2020-09-17 MED ORDER — FE FUMARATE-B12-VIT C-FA-IFC PO CAPS
1.0000 | ORAL_CAPSULE | Freq: Two times a day (BID) | ORAL | Status: DC
  Administered 2020-09-18 – 2020-09-20 (×5): 1 via ORAL
  Filled 2020-09-17 (×7): qty 1

## 2020-09-17 MED ORDER — FERROUS SULFATE 325 (65 FE) MG PO TABS
325.0000 mg | ORAL_TABLET | Freq: Two times a day (BID) | ORAL | Status: DC
  Administered 2020-09-18 – 2020-09-20 (×5): 325 mg via ORAL
  Filled 2020-09-17 (×5): qty 1

## 2020-09-17 MED ORDER — SODIUM CHLORIDE 0.9 % IV SOLN: INTRAVENOUS | Status: AC

## 2020-09-17 MED ORDER — LEVOCETIRIZINE DIHYDROCHLORIDE 5 MG PO TABS: 5.0000 mg | ORAL_TABLET | Freq: Every evening | ORAL | Status: DC

## 2020-09-17 MED ORDER — VITAMIN D 25 MCG (1000 UNIT) PO TABS
2000.0000 [IU] | ORAL_TABLET | Freq: Every day | ORAL | Status: DC
  Administered 2020-09-18 – 2020-09-20 (×3): 2000 [IU] via ORAL
  Filled 2020-09-17 (×3): qty 2

## 2020-09-17 MED ORDER — ALBUTEROL SULFATE HFA 108 (90 BASE) MCG/ACT IN AERS
2.0000 | INHALATION_SPRAY | RESPIRATORY_TRACT | Status: DC | PRN
  Filled 2020-09-17: qty 6.7

## 2020-09-17 MED ORDER — FUROSEMIDE 40 MG PO TABS
40.0000 mg | ORAL_TABLET | Freq: Every day | ORAL | Status: DC
  Administered 2020-09-17 – 2020-09-18 (×2): 40 mg via ORAL
  Filled 2020-09-17 (×2): qty 1

## 2020-09-17 MED ORDER — ROPINIROLE HCL 1 MG PO TABS
1.0000 mg | ORAL_TABLET | Freq: Every day | ORAL | Status: DC
  Administered 2020-09-17 – 2020-09-19 (×3): 1 mg via ORAL
  Filled 2020-09-17 (×3): qty 1

## 2020-09-17 MED ORDER — SIMVASTATIN 20 MG PO TABS
20.0000 mg | ORAL_TABLET | Freq: Every day | ORAL | Status: DC
  Administered 2020-09-18 – 2020-09-19 (×2): 20 mg via ORAL
  Filled 2020-09-17 (×2): qty 2
  Filled 2020-09-17: qty 1

## 2020-09-17 MED ORDER — GUAIFENESIN ER 600 MG PO TB12
600.0000 mg | ORAL_TABLET | Freq: Two times a day (BID) | ORAL | Status: DC
  Administered 2020-09-18 – 2020-09-20 (×5): 600 mg via ORAL
  Filled 2020-09-17 (×5): qty 1

## 2020-09-17 MED ORDER — ALUM & MAG HYDROXIDE-SIMETH 200-200-20 MG/5ML PO SUSP: 30.0000 mL | Freq: Four times a day (QID) | ORAL | Status: DC | PRN

## 2020-09-17 MED ORDER — ACETAMINOPHEN 325 MG PO TABS: 650.0000 mg | ORAL_TABLET | Freq: Four times a day (QID) | ORAL | Status: DC | PRN

## 2020-09-17 MED ORDER — PANTOPRAZOLE SODIUM 40 MG PO TBEC
40.0000 mg | DELAYED_RELEASE_TABLET | Freq: Two times a day (BID) | ORAL | Status: DC
Start: 2020-09-18 — End: 2020-09-20
  Administered 2020-09-18 – 2020-09-20 (×5): 40 mg via ORAL
  Filled 2020-09-17 (×5): qty 1

## 2020-09-17 MED ORDER — TRAZODONE HCL 50 MG PO TABS: 25.0000 mg | ORAL_TABLET | Freq: Every evening | ORAL | Status: DC | PRN

## 2020-09-17 MED ORDER — IPRATROPIUM-ALBUTEROL 0.5-2.5 (3) MG/3ML IN SOLN
3.0000 mL | Freq: Once | RESPIRATORY_TRACT | Status: AC
  Administered 2020-09-17: 3 mL via RESPIRATORY_TRACT
  Filled 2020-09-17: qty 3

## 2020-09-17 MED ORDER — FLUTICASONE FUROATE-VILANTEROL 100-25 MCG/INH IN AEPB
1.0000 | INHALATION_SPRAY | Freq: Every day | RESPIRATORY_TRACT | Status: DC
  Administered 2020-09-18 – 2020-09-20 (×3): 1 via RESPIRATORY_TRACT
  Filled 2020-09-17: qty 28

## 2020-09-17 MED ORDER — TRAMADOL HCL 50 MG PO TABS
50.0000 mg | ORAL_TABLET | Freq: Four times a day (QID) | ORAL | Status: DC | PRN
  Administered 2020-09-18: 50 mg via ORAL
  Filled 2020-09-17: qty 1

## 2020-09-17 MED ORDER — TRIAMCINOLONE ACETONIDE 0.5 % EX CREA
TOPICAL_CREAM | Freq: Two times a day (BID) | CUTANEOUS | Status: DC
  Administered 2020-09-19 – 2020-09-20 (×2): 1 via TOPICAL
  Filled 2020-09-17: qty 15

## 2020-09-17 MED ORDER — GABAPENTIN 300 MG PO CAPS
300.0000 mg | ORAL_CAPSULE | Freq: Two times a day (BID) | ORAL | Status: DC
  Administered 2020-09-17 – 2020-09-20 (×6): 300 mg via ORAL
  Filled 2020-09-17: qty 1
  Filled 2020-09-17: qty 3
  Filled 2020-09-17 (×4): qty 1

## 2020-09-17 MED ORDER — CALCIPOTRIENE 0.005 % EX CREA: TOPICAL_CREAM | Freq: Two times a day (BID) | CUTANEOUS | Status: DC

## 2020-09-17 MED ORDER — DONEPEZIL HCL 5 MG PO TABS
10.0000 mg | ORAL_TABLET | Freq: Every day | ORAL | Status: DC
Start: 2020-09-18 — End: 2020-09-20
  Administered 2020-09-18 – 2020-09-20 (×3): 10 mg via ORAL
  Filled 2020-09-17 (×3): qty 2

## 2020-09-17 MED ORDER — ONDANSETRON HCL 4 MG/2ML IJ SOLN: 4.0000 mg | Freq: Four times a day (QID) | INTRAMUSCULAR | Status: DC | PRN

## 2020-09-17 MED ORDER — POTASSIUM CHLORIDE 10 MEQ/100ML IV SOLN
10.0000 meq | Freq: Once | INTRAVENOUS | Status: AC
  Administered 2020-09-17: 10 meq via INTRAVENOUS
  Filled 2020-09-17: qty 100

## 2020-09-17 MED ORDER — SODIUM CHLORIDE 0.9 % IV BOLUS
500.0000 mL | Freq: Once | INTRAVENOUS | Status: AC
  Administered 2020-09-17: 500 mL via INTRAVENOUS

## 2020-09-17 MED ORDER — POTASSIUM CHLORIDE CRYS ER 20 MEQ PO TBCR
40.0000 meq | EXTENDED_RELEASE_TABLET | Freq: Once | ORAL | Status: AC
  Administered 2020-09-17: 40 meq via ORAL
  Filled 2020-09-17: qty 2

## 2020-09-17 MED ORDER — SODIUM CHLORIDE 0.9% FLUSH
3.0000 mL | Freq: Two times a day (BID) | INTRAVENOUS | Status: DC
  Administered 2020-09-17 – 2020-09-20 (×5): 3 mL via INTRAVENOUS

## 2020-09-17 MED ORDER — MAGNESIUM HYDROXIDE 400 MG/5ML PO SUSP: 30.0000 mL | Freq: Every day | ORAL | Status: DC | PRN

## 2020-09-17 NOTE — ED Provider Notes (Signed)
[facility] Emergency Department Provider Note   ____________________________________________   Event Date/Time   First MD Initiated Contact with Patient 09/17/20 1812     (approximate)  I have reviewed the triage vital signs and the nursing notes.   HISTORY  Chief Complaint    HPI Mariah [name] is a 75 y.o. female history of AAA, DVT, pneumonia, hyperlipidemia hypertension  Patient comes for evaluation after she passed out today.  She reports she was in the kitchen was standing started feeling very lightheaded and then awoke on the floor.  Family reports she sort of slid down but did strike her head on the way down.  Denies pain or discomfort no headache.  She feels okay now just feels tired.  She been in her normal state of health, always feeling a little tired and fatigued lately.  She has been moving between home and currently living with her daughter  No chest pain or trouble breathing she did not have any palpitations.  She feels okay otherwise.  She has however noticed that her gums seem a little bit sore and she thinks she may have developed thrush   Past Medical History:  Diagnosis Date  . Abdominal aortic aneurysm (AAA) ([place])   . Arthritis   . CAP (community acquired pneumonia) 06/03/2020  . COPD (chronic obstructive pulmonary disease) ([place])   . GERD (gastroesophageal reflux disease)   . Headache    sinus  . History of adenomatous polyp of colon   . Hyperlipidemia   . Hypertension   . Legionella infection ([place]) 07/17/2020  . Psoriasis   . Right lumbar radiculitis   . Vitamin D deficiency     Patient Active Problem List   Diagnosis Date Noted  . Syncope 09/17/2020  . History of DVT (deep vein thrombosis) 07/09/2020  . 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 Hematoma of right hip 06/18/2020  . Congestive heart failure ([place]) 06/18/2020  . Weakness   . Closed nondisplaced fracture of distal phalanx of right little finger  06/06/2020  . Closed nondisplaced fracture of distal phalanx of right thumb 06/06/2020  . Protein-calorie malnutrition, severe 06/05/2020  . Hypoalbuminemia 06/03/2020  . Cutaneous horn 06/03/2020  . Pleural effusion 06/03/2020  . Chronic diastolic CHF (congestive heart failure) ([place]) 06/03/2020  . Acute respiratory failure with hypoxia ([place]) 06/02/2020  . Irregular heartbeat 05/30/2020  . Head injury 05/30/2020  . Bilateral edema of lower extremity 05/30/2020  . Closed fracture of right hand 05/30/2020  . Blood transfusion reaction 05/30/2020  . CKD (chronic kidney disease), stage III ([place]) 05/26/2020  . Hematoma of right thigh 05/26/2020  . Finger fracture, right 05/26/2020  . Dyspnea 05/26/2020  . Acute deep vein thrombosis (DVT) of tibial vein of left lower extremity ([place]) 04/05/2020  . Mental confusion 04/01/2020  . Unintentional weight loss 03/01/2020  . Fall (on)(from) sidewalk curb, initial encounter 03/01/2020  . Memory loss 03/01/2020  . Rash of back 05/15/2019  . Allergic rhinitis 01/10/2018  . Cholelithiasis 03/16/2016  . Peripheral arterial disease ([place]) 02/05/2016  . 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 Abdominal aortic aneurysm (AAA) ([place]) 01/13/2016  . 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 Nerve root inflammation 10/25/2015  . Psoriatic arthritis ([place]) 10/07/2015  . 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 Right hip pain 08/12/2015  . Arthralgia of multiple joints 08/12/2015  . Age related osteoporosis 08/12/2015  . COPD with acute exacerbation ([place]) 06/24/2015  . Senile purpura ([place]) 06/24/2015  . Abnormal  serum level of alkaline phosphatase 06/18/2015  . Elevated hemoglobin ([place]) 06/18/2015  . Lethargy 06/18/2015  . Fistula 06/18/2015  . Gastro-esophageal reflux disease without esophagitis 06/18/2015  . Restless leg 06/18/2015  . Avitaminosis D 06/18/2015  . Mechanical and motor problems with internal organs 10/11/2009  . Hypercholesteremia 10/11/2009   . Arthritis, degenerative 09/25/2009  . Essential (primary) hypertension 09/25/2009  . Psoriasis 09/25/2009  . Compulsive tobacco user syndrome 09/25/2009    Past Surgical History:  Procedure Laterality Date  . APPENDECTOMY  2000  . BREAST BIOPSY Right 09/2016   radial scar;COMPLEX SCLEROSING LESION  . BREAST LUMPECTOMY Right 11/24/2016   COMPLEX SCLEROSING LESION  . BREAST LUMPECTOMY WITH NEEDLE LOCALIZATION Right 11/24/2016   Procedure: BREAST LUMPECTOMY WITH NEEDLE LOCALIZATION;  Surgeon: Leonie Green, MD;  Location: [facility] ORS;  Service: General;  Laterality: Right;  . COLONOSCOPY WITH PROPOFOL N/A 08/10/2016   Procedure: COLONOSCOPY WITH PROPOFOL;  Surgeon: Manya Silvas, MD;  [facility] ENDOSCOPY;  Service: Endoscopy;  Laterality: N/A;  . ESOPHAGOGASTRODUODENOSCOPY (EGD) WITH PROPOFOL N/A 08/10/2016   Procedure: ESOPHAGOGASTRODUODENOSCOPY (EGD) WITH PROPOFOL;  Surgeon: Manya Silvas, MD;  Location: [facility] ENDOSCOPY;  Service: Endoscopy;  Laterality: N/A;  . FOOT SURGERY Bilateral   . JOINT REPLACEMENT Right 01/27/2016   hip  . 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 TOTAL HIP ARTHROPLASTY Right 01/27/2016   Procedure: TOTAL HIP ARTHROPLASTY;  Surgeon: Dereck Leep, MD;  Location: [facility] ORS;  Service: Orthopedics;  Laterality: Right;    Prior to Admission medications   Medication Sig Start Date End Date Taking? Authorizing Provider  albuterol (VENTOLIN HFA) 108 (90 Base) MCG/ACT inhaler INHALE 1-2 PUFFS INTO THE LUNGS EVERY 6 HOURS AS NEEDED FOR WHEEZING OR SHORTNESS OF BREATH. 05/22/20   Carles Collet M, PA-C  apixaban (ELIQUIS) 5 MG TABS tablet Take 1 tablet (5 mg total) by mouth 2 (two) times daily. 06/09/20   Nita Sells, MD  aspirin EC 81 MG tablet Take 81 mg by mouth daily. Swallow whole.    [provider]  betamethasone dipropionate (DIPROLENE) 0.05 % ointment Apply topically daily. 01/01/20   Ralene Bathe, MD  BREO ELLIPTA 100-25 MCG/INH AEPB  TAKE 1 PUFF BY MOUTH EVERY DAY 09/01/20   [name], MD  calcipotriene (DOVONOX) 0.005 % cream APPLY TO SKIN ONCE A DAY APPLY DAILY ON HANDS, FEET AND WRIST X 2 WEEKS, THEN USE 5 DAYS A WEEK 01/01/20   Ralene Bathe, MD  Calcipotriene 0.005 % solution APPLY 1 A SMALL AMOUNT TO SCALP ONCE A DAY APPLY TO SCALP DAILY FOR 2 WEEKS, THEN USE ON SCALP 5 DAYS A WEEK 09/02/20   Ralene Bathe, MD  Cholecalciferol (VITAMIN D3) 2000 units TABS Take 2,000 Units by mouth daily.    [provider]  clobetasol (TEMOVATE) 0.05 % external solution APPLY TO AFFECTED AREA TWICE A DAY 09/01/20   Bacigalupo, Dionne Bucy, MD  COSENTYX SENSOREADY, 300 MG, 150 MG/ML SOAJ Inject 2 Syringes into the skin every 28 (twenty-eight) days. 05/31/20   [provider]  donepezil (ARICEPT) 10 MG tablet Take 10 mg by mouth daily. 07/01/20   [provider]  ferrous [id number] C-folic acid (TRINSICON / FOLTRIN) capsule Take 1 capsule by mouth 2 (two) times daily after a meal. 05/28/20   Lorella Nimrod, MD  ferrous sulfate 325 (65 FE) MG tablet Take 325 mg by mouth 2 (two) times daily. 05/28/20   [provider]  furosemide (LASIX)  40 MG tablet Take 1 tablet (40 mg total) by mouth daily. If weight 127 lb or less, take 20 mg by mouth daily. 07/01/20   Minna Merritts, MD  gabapentin (NEURONTIN) 300 MG capsule TAKE 1 CAPSULE BY MOUTH TWICE A DAY 09/01/20   Bacigalupo, Dionne Bucy, MD  guaiFENesin (MUCINEX) 600 MG 12 hr tablet Take 1 tablet (600 mg total) by mouth 2 (two) times daily. 06/06/20   Nita Sells, MD  levocetirizine (XYZAL) 5 MG tablet TAKE 1 TABLET BY MOUTH EVERY DAY IN THE EVENING 12/03/19   Bacigalupo, Dionne Bucy, MD  methotrexate (RHEUMATREX) 2.5 MG tablet Take 15 mg by mouth once a week.  05/23/20   [provider]  metoprolol succinate (TOPROL-XL) 50 MG 24 hr tablet TAKE 1 TABLET BY MOUTH DAILY. TAKE WITH OR IMMEDIATELY FOLLOWING A MEAL. 09/01/20   [name], MD  pantoprazole (PROTONIX) 20 MG tablet TAKE 1 TABLET (20 MG TOTAL) BY MOUTH 2 (TWO) TIMES DAILY BEFORE A MEAL. 06/26/20   Bacigalupo, Dionne Bucy, MD  potassium chloride SA (KLOR-CON) 20 MEQ tablet Take 2 tablets (40 mEq total) by mouth daily. 06/06/20   Nita Sells, MD  rOPINIRole (REQUIP) 1 MG tablet TAKE 1 TABLET (1 MG TOTAL) BY MOUTH AT BEDTIME. 06/26/20   Bacigalupo, Dionne Bucy, MD  simvastatin (ZOCOR) 20 MG tablet TAKE 1 TABLET BY MOUTH EVERY DAY 09/04/20   Bacigalupo, Dionne Bucy, MD  traMADol (ULTRAM) 50 MG tablet TAKE 1 TABLET BY MOUTH EVERY 6 HOURS AS NEEDED FOR MODERATE OR SEVERE PAIN 07/05/20   [name], MD    Allergies Clindamycin/lincomycin and Amoxicillin  Family History  Problem Relation Age of Onset  . Cancer Mother        kidney  . Arthritis Father   . Heart disease Father   . COPD Sister   . Fibromyalgia Sister   . Osteoarthritis Sister   . Osteoarthritis Maternal Aunt   . Colon cancer Paternal Aunt   . Colon cancer Paternal Uncle   . Colon cancer Paternal Grandmother   . Breast cancer Neg Hx     Social History Social History   Tobacco Use  . Smoking status: Current Every Day Smoker    Packs/day: 1.50    Years: 40.00    Pack years: 60.00  . Smokeless tobacco: Never Used  . Tobacco comment: 0.5 PPD 06/19/2020  Vaping Use  . Vaping Use: Some days  Substance Use Topics  . Alcohol use: Yes    Comment: occasionally glass of wine  . Drug use: No    Review of Systems Constitutional: No fever/chills Eyes: No visual changes. ENT: No sore throat.  Reports her teeth in front feel little sore and her tongue feels sore with white stuff on it that looks like "thrush" Cardiovascular: Denies chest pain. Respiratory: Denies shortness of breath. Gastrointestinal: No abdominal pain.   Genitourinary: Negative for dysuria. Musculoskeletal: Negative for back pain. Skin: Negative for rash. Neurological: Negative for headaches, areas of focal  weakness or numbness.    ____________________________________________   PHYSICAL EXAM:  VITAL SIGNS: ED Triage Vitals  Enc Vitals Group     BP 09/17/20 1553 (!) 76/57     Pulse Rate 09/17/20 1553 70     Resp 09/17/20 1553 18     Temp 09/17/20 1553 (!) 97.5 F (36.4 C)     Temp Source 09/17/20 1553 Oral     SpO2 09/17/20 1553 95 %     Weight --  Height --      Head Circumference --      Peak Flow --      Pain Score 09/17/20 1528 0     Pain Loc --      Pain Edu? --      Excl. in [place]? --     Constitutional: Alert and oriented. Well appearing and in no acute distress.  She is very pleasant.  She is well oriented Eyes: Conjunctivae are normal. Head: Atraumatic. Nose: No congestion/rhinnorhea. Mouth/Throat: Mucous membranes are moist. Neck: No stridor.  Full range of motion without neck pain.  No cervical thoracic or lumbar tenderness. Cardiovascular: Normal rate, regular rhythm. Grossly normal heart sounds.  Good peripheral circulation. Respiratory: Normal respiratory effort.  No retractions. Lungs CTAB. Gastrointestinal: Soft and nontender. No distention. Musculoskeletal: No lower extremity tenderness nor edema. Neurologic:  Normal speech and language. No gross focal neurologic deficits are appreciated.  Skin:  Skin is warm, dry and intact. No rash noted. Psychiatric: Mood and affect are normal. Speech and behavior are normal.  ____________________________________________   LABS (all labs ordered are listed, but only abnormal results are displayed)  Labs Reviewed  BASIC METABOLIC PANEL - Abnormal; Notable for the following components:      Result Value   Sodium 134 (*)    Potassium 3.0 (*)    Chloride 97 (*)    Glucose, Bld 116 (*)    Creatinine, Ser 1.03 (*)    GFR, Estimated 56 (*)    All other components within normal limits  CBC - Abnormal; Notable for the following components:   RBC 3.14 (*)    Hemoglobin 10.7 (*)    HCT 29.6 (*)    MCH 34.1 (*)     MCHC 36.1 (*)    Platelets 42 (*)    All other components within normal limits  URINALYSIS, COMPLETE (UACMP) WITH MICROSCOPIC - Abnormal; Notable for the following components:   Color, Urine YELLOW (*)    APPearance CLEAR (*)    Specific Gravity, Urine 1.004 (*)    Hgb urine dipstick SMALL (*)    All other components within normal limits  SARS CORONAVIRUS 2 (TAT 6-24 HRS)  MAGNESIUM   ____________________________________________  EKG  Reviewed entered by me at 1550 Heart rate 75 QRS 79 QTc 490, Normal sinus rhythm, mild prolongation of the QT interval ____________________________________________  RADIOLOGY  DG Chest 2 View  Result Date: 09/17/2020 CLINICAL DATA:  Syncope, hit back of head, dimension EXAM: CHEST - 2 VIEW COMPARISON:  07/08/2020 FINDINGS: Frontal and lateral views of the chest demonstrate an unremarkable cardiac silhouette. Stable atherosclerosis of the aortic arch. Background parenchymal lung scarring without airspace disease, effusion, or pneumothorax. Interval clearing of the right basilar pneumonia seen previously. IMPRESSION: 1. No acute intrathoracic process. Electronically Signed   By: Randa Ngo M.D.   On: 09/17/2020 17:12   CT Head Wo Contrast  Result Date: 09/17/2020 CLINICAL DATA:  Facial trauma EXAM: CT HEAD WITHOUT CONTRAST TECHNIQUE: Contiguous axial images were obtained from the base of the skull through the vertex without intravenous contrast. COMPARISON:  May 19, 2020 FINDINGS: Brain: No evidence of acute territorial infarction, hemorrhage, hydrocephalus,extra-axial collection or mass lesion/mass effect. There is dilatation the ventricles and sulci consistent with age-related atrophy. Low-attenuation changes in the deep white matter consistent with small vessel ischemia. Vascular: No hyperdense vessel or unexpected calcification. Skull: The skull is intact. No fracture or focal lesion identified. Sinuses/Orbits: The visualized paranasal sinuses and  mastoid air  cells are clear. The orbits and globes intact. Other: None IMPRESSION: No acute intracranial abnormality. Findings consistent with age related atrophy and chronic small vessel ischemia Electronically Signed   By: Prudencio Pair M.D.   On: 09/17/2020 19:14     CT head reviewed negative for acute intracranial finding.  Chest x-ray negative for acute finding. ____________________________________________   PROCEDURES  Procedure(s) performed: None  Procedures  Critical Care performed: No  ____________________________________________   INITIAL IMPRESSION / ASSESSMENT AND PLAN / ED COURSE  Pertinent labs & imaging results that were available during my care of the patient were reviewed by me and considered in my medical decision making (see chart for details).   Patient presents after syncopal episode, seemingly no preceding symptoms.  She does have evidence of what appears to be thrush and some apparent gingivitis as well.  She is resting comfortably calm conversant well oriented this time.  Of note she did present with hypotension which is resolved, additionally she is noted to have a hematocrit less than 30% and mild prolongation of her QT interval with hypokalemia.  For these reasons I will admit her to the hospital for observation replete potassium and telemetry monitoring anticipated.  Patient comfortable agreeable with this plan  She is not complaining of any obvious acute chest pain or cardiac complaint, no palpitations, no chest pain, no neurologic symptoms, no acute vascular symptoms, or other symptoms of identified major concern at this time.  Further work-up on the hospital service.  ----------------------------------------- 7:07 PM on 09/17/2020 -----------------------------------------  Reassuring imaging.  Patient resting comfortably with normal hemodynamics repleted potassium.  Will admit for further observation after syncopal episode.  Discussed with Dr. Normand Sloop the  hospitalist.  We did discuss some concerns around hypokalemia, new somewhat unexplained thrombocytopenia, patient's history of having a new DVT that seemingly was unprovoked a few months ago.  Multiple things that point towards some underlying concern, will require further work-up and observation.  Patient understanding and agreeable with observation  Mariah [name] was evaluated in Emergency Department on 09/17/2020 for the symptoms described in the history of present illness. She was evaluated in the context of the global COVID-19 pandemic, which necessitated consideration that the patient might be at risk for infection with the SARS-CoV-2 virus that causes COVID-19. Institutional protocols and algorithms that pertain to the evaluation of patients at risk for COVID-19 are in a state of rapid change based on information released by regulatory bodies including the CDC and federal and state organizations. These policies and algorithms were followed during the patient's care in the ED.       ____________________________________________   FINAL CLINICAL IMPRESSION(S) / ED DIAGNOSES  Final diagnoses:  Syncope and collapse  Hypokalemia  Thrombocytopenia ([place])        Note:  This document was prepared using Dragon voice recognition software and may include unintentional dictation errors       Delman Kitten, MD 09/17/20 1927

## 2020-09-17 NOTE — Telephone Encounter (Signed)
Noted  

## 2020-09-17 NOTE — ED Notes (Signed)
Sent rainbow to the lab. 

## 2020-09-17 NOTE — ED Notes (Signed)
Patient up to [facility] standby assist, tolerated well.   Now off unit to CT.

## 2020-09-17 NOTE — H&P (Signed)
[place]   PATIENT NAME: Mariah [name]    MR#:  [id number]  DATE OF BIRTH:  May 16, 1946  DATE OF ADMISSION:  09/17/2020  PRIMARY CARE PHYSICIAN: [name], MD   Patient is coming from: Home  REQUESTING/REFERRING PHYSICIAN: Delman Kitten, MD CHIEF COMPLAINT:   I passed out  HISTORY OF PRESENT ILLNESS:  Mariah [name] is a 75 y.o. Caucasian female with medical history significant for COPD, GERD, hypertension, dyslipidemia, abdominal aortic aneurysm, history of DVT on Eliquis, who presented to the emergency room with acute onset of syncope.  The patient was in her kitchen ink staining and started feeling lightheaded and passed out hitting the back of her head.  She denied any headache after her fall.  No chest pain or palpitations.  No paresthesias or focal muscle weakness.  No nausea or vomiting.  No recent cough or wheezing or dyspnea.  No fever or chills.  No dysuria, oliguria or hematuria or flank pain.   ED Course: Upon presentation to the emergency room blood pressure was 76/57 with temperature 97.5 with otherwise normal vital signs.  Blood pressure after hydration came up to 135/75.  Labs revealed hypokalemia with normal magnesium, mild hyponatremia and hypochloremia, anemia with hemoglobin of 10.7 hematocrit 29.6 and significant thrombo-cytopenia with platelets 42 worse than previous levels in January of this year at which time hemoglobin was 12.5 and hematocrit 37.7 with platelets of 149. EKG as reviewed by me : Showed normal sinus rhythm with rate of 76 with poor R wave progression. Imaging: Two-view chest x-ray showed no acute cardiopulmonary disease.  Noncontrasted head CT scan revealed acute abnormalities.  It showed findings consistent with age-related atrophy and chronic small vessel ischemia.  The patient was given 10 mEq of IV potassium chloride and 40 mEq p.o. as well as 500 mL of IV normal saline bolus.  She will be admitted to an observation medical monitored  bed for further evaluation and management. PAST MEDICAL HISTORY:   Past Medical History:  Diagnosis Date  . Abdominal aortic aneurysm (AAA) ([place])   . Arthritis   . CAP (community acquired pneumonia) 06/03/2020  . COPD (chronic obstructive pulmonary disease) ([place])   . GERD (gastroesophageal reflux disease)   . Headache    sinus  . History of adenomatous polyp of colon   . Hyperlipidemia   . Hypertension   . Legionella infection ([place]) 07/17/2020  . Psoriasis   . Right lumbar radiculitis   . Vitamin D deficiency     PAST SURGICAL HISTORY:   Past Surgical History:  Procedure Laterality Date  . APPENDECTOMY  2000  . BREAST BIOPSY Right 09/2016   radial scar;COMPLEX SCLEROSING LESION  . BREAST LUMPECTOMY Right 11/24/2016   COMPLEX SCLEROSING LESION  . BREAST LUMPECTOMY WITH NEEDLE LOCALIZATION Right 11/24/2016   Procedure: BREAST LUMPECTOMY WITH NEEDLE LOCALIZATION;  Surgeon: Leonie Green, MD;  Location: [facility] ORS;  Service: General;  Laterality: Right;  . COLONOSCOPY WITH PROPOFOL N/A 08/10/2016   Procedure: COLONOSCOPY WITH PROPOFOL;  Surgeon: Manya Silvas, MD;  Location: [facility] ENDOSCOPY;  Service: Endoscopy;  Laterality: N/A;  . ESOPHAGOGASTRODUODENOSCOPY (EGD) WITH PROPOFOL N/A 08/10/2016   Procedure: ESOPHAGOGASTRODUODENOSCOPY (EGD) WITH PROPOFOL;  Surgeon: Manya Silvas, MD;  Location: [facility] ENDOSCOPY;  Service: Endoscopy;  Laterality: N/A;  . FOOT SURGERY Bilateral   . JOINT REPLACEMENT Right 01/27/2016   hip  . 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 TOTAL HIP ARTHROPLASTY Right 01/27/2016  Procedure: TOTAL HIP ARTHROPLASTY;  Surgeon: Dereck Leep, MD;  Location: [facility] ORS;  Service: Orthopedics;  Laterality: Right;    SOCIAL HISTORY:   Social History   Tobacco Use  . Smoking status: Current Every Day Smoker    Packs/day: 1.50    Years: 40.00    Pack years: 60.00  . Smokeless tobacco: Never Used  . Tobacco comment: 0.5 PPD 06/19/2020  Substance Use Topics   . Alcohol use: Yes    Comment: occasionally glass of wine    FAMILY HISTORY:   Family History  Problem Relation Age of Onset  . Cancer Mother        kidney  . Arthritis Father   . Heart disease Father   . COPD Sister   . Fibromyalgia Sister   . Osteoarthritis Sister   . Osteoarthritis Maternal Aunt   . Colon cancer Paternal Aunt   . Colon cancer Paternal Uncle   . Colon cancer Paternal Grandmother   . Breast cancer Neg Hx     DRUG ALLERGIES:   Allergies  Allergen Reactions  . Clindamycin/Lincomycin     diarrhea  . Amoxicillin Diarrhea    REVIEW OF SYSTEMS:   ROS As per history of present illness. All pertinent systems were reviewed above. Constitutional, HEENT, cardiovascular, respiratory, GI, GU, musculoskeletal, neuro, psychiatric, endocrine, integumentary and hematologic systems were reviewed and are otherwise negative/unremarkable except for positive findings mentioned above in the HPI.   MEDICATIONS AT HOME:   Prior to Admission medications   Medication Sig Start Date End Date Taking? Authorizing Provider  albuterol (VENTOLIN HFA) 108 (90 Base) MCG/ACT inhaler INHALE 1-2 PUFFS INTO THE LUNGS EVERY 6 HOURS AS NEEDED FOR WHEEZING OR SHORTNESS OF BREATH. 05/22/20   Carles Collet M, PA-C  apixaban (ELIQUIS) 5 MG TABS tablet Take 1 tablet (5 mg total) by mouth 2 (two) times daily. 06/09/20   Nita Sells, MD  aspirin EC 81 MG tablet Take 81 mg by mouth daily. Swallow whole.    [provider]  betamethasone dipropionate (DIPROLENE) 0.05 % ointment Apply topically daily. 01/01/20   Ralene Bathe, MD  BREO ELLIPTA 100-25 MCG/INH AEPB TAKE 1 PUFF BY MOUTH EVERY DAY 09/01/20   [name], MD  calcipotriene (DOVONOX) 0.005 % cream APPLY TO SKIN ONCE A DAY APPLY DAILY ON HANDS, FEET AND WRIST X 2 WEEKS, THEN USE 5 DAYS A WEEK 01/01/20   Ralene Bathe, MD  Calcipotriene 0.005 % solution APPLY 1 A SMALL AMOUNT TO SCALP ONCE A DAY APPLY TO  SCALP DAILY FOR 2 WEEKS, THEN USE ON SCALP 5 DAYS A WEEK 09/02/20   Ralene Bathe, MD  Cholecalciferol (VITAMIN D3) 2000 units TABS Take 2,000 Units by mouth daily.    [provider]  clobetasol (TEMOVATE) 0.05 % external solution APPLY TO AFFECTED AREA TWICE A DAY 09/01/20   Bacigalupo, Dionne Bucy, MD  COSENTYX SENSOREADY, 300 MG, 150 MG/ML SOAJ Inject 2 Syringes into the skin every 28 (twenty-eight) days. 05/31/20   [provider]  donepezil (ARICEPT) 10 MG tablet Take 10 mg by mouth daily. 07/01/20   [provider]  ferrous [id number] C-folic acid (TRINSICON / FOLTRIN) capsule Take 1 capsule by mouth 2 (two) times daily after a meal. 05/28/20   Lorella Nimrod, MD  ferrous sulfate 325 (65 FE) MG tablet Take 325 mg by mouth 2 (two) times daily. 05/28/20   [provider]  furosemide (LASIX) 40 MG tablet Take 1 tablet (40  mg total) by mouth daily. If weight 127 lb or less, take 20 mg by mouth daily. 07/01/20   Minna Merritts, MD  gabapentin (NEURONTIN) 300 MG capsule TAKE 1 CAPSULE BY MOUTH TWICE A DAY 09/01/20   Bacigalupo, Dionne Bucy, MD  guaiFENesin (MUCINEX) 600 MG 12 hr tablet Take 1 tablet (600 mg total) by mouth 2 (two) times daily. 06/06/20   Nita Sells, MD  levocetirizine (XYZAL) 5 MG tablet TAKE 1 TABLET BY MOUTH EVERY DAY IN THE EVENING 12/03/19   Bacigalupo, Dionne Bucy, MD  methotrexate (RHEUMATREX) 2.5 MG tablet Take 15 mg by mouth once a week.  05/23/20   [provider]  metoprolol succinate (TOPROL-XL) 50 MG 24 hr tablet TAKE 1 TABLET BY MOUTH DAILY. TAKE WITH OR IMMEDIATELY FOLLOWING A MEAL. 09/01/20   [name], MD  pantoprazole (PROTONIX) 20 MG tablet TAKE 1 TABLET (20 MG TOTAL) BY MOUTH 2 (TWO) TIMES DAILY BEFORE A MEAL. 06/26/20   Bacigalupo, Dionne Bucy, MD  potassium chloride SA (KLOR-CON) 20 MEQ tablet Take 2 tablets (40 mEq total) by mouth daily. 06/06/20   Nita Sells, MD  rOPINIRole (REQUIP) 1 MG  tablet TAKE 1 TABLET (1 MG TOTAL) BY MOUTH AT BEDTIME. 06/26/20   Bacigalupo, Dionne Bucy, MD  simvastatin (ZOCOR) 20 MG tablet TAKE 1 TABLET BY MOUTH EVERY DAY 09/04/20   Bacigalupo, Dionne Bucy, MD  traMADol (ULTRAM) 50 MG tablet TAKE 1 TABLET BY MOUTH EVERY 6 HOURS AS NEEDED FOR MODERATE OR SEVERE PAIN 07/05/20   [name], MD      VITAL SIGNS:  Blood pressure 135/75, pulse 80, temperature (!) 97.5 F (36.4 C), temperature source Oral, resp. rate 18, SpO2 97 %.  PHYSICAL EXAMINATION:  Physical Exam  GENERAL:  75 y.o.-year-old Caucasian female patient lying in the bed with no acute distress.  EYES: Pupils equal, round, reactive to light and accommodation. No scleral icterus. Extraocular muscles intact.  HEENT: Head atraumatic, normocephalic. Oropharynx and nasopharynx clear.  NECK:  Supple, no jugular venous distention. No thyroid enlargement, no tenderness.  LUNGS: Normal breath sounds bilaterally, no wheezing, rales,rhonchi or crepitation. No use of accessory muscles of respiration.  CARDIOVASCULAR: Regular rate and rhythm, S1, S2 normal. No murmurs, rubs, or gallops.  ABDOMEN: Soft, nondistended, nontender. Bowel sounds present. No organomegaly or mass.  EXTREMITIES: No pedal edema, cyanosis, or clubbing.  NEUROLOGIC: Cranial nerves II through XII are intact. Muscle strength 5/5 in all extremities. Sensation intact. Gait not checked.  PSYCHIATRIC: The patient is alert and oriented x 3.  Normal affect and good eye contact. SKIN: No obvious rash, lesion, or ulcer.   LABORATORY PANEL:   CBC Recent Labs  Lab 09/17/20 1600  WBC 5.0  HGB 10.7*  HCT 29.6*  PLT 42*   ------------------------------------------------------------------------------------------------------------------  Chemistries  Recent Labs  Lab 09/17/20 1600  NA 134*  K 3.0*  CL 97*  CO2 27  GLUCOSE 116*  BUN 10  CREATININE 1.03*  CALCIUM 9.1  MG 2.2    ------------------------------------------------------------------------------------------------------------------  Cardiac Enzymes No results for input(s): TROPONINI in the last 168 hours. ------------------------------------------------------------------------------------------------------------------  RADIOLOGY:  DG Chest 2 View  Result Date: 09/17/2020 CLINICAL DATA:  Syncope, hit back of head, dimension EXAM: CHEST - 2 VIEW COMPARISON:  07/08/2020 FINDINGS: Frontal and lateral views of the chest demonstrate an unremarkable cardiac silhouette. Stable atherosclerosis of the aortic arch. Background parenchymal lung scarring without airspace disease, effusion, or pneumothorax. Interval clearing of the right basilar pneumonia seen previously. IMPRESSION:  1. No acute intrathoracic process. Electronically Signed   By: Randa Ngo M.D.   On: 09/17/2020 17:12   CT Head Wo Contrast  Result Date: 09/17/2020 CLINICAL DATA:  Facial trauma EXAM: CT HEAD WITHOUT CONTRAST TECHNIQUE: Contiguous axial images were obtained from the base of the skull through the vertex without intravenous contrast. COMPARISON:  May 19, 2020 FINDINGS: Brain: No evidence of acute territorial infarction, hemorrhage, hydrocephalus,extra-axial collection or mass lesion/mass effect. There is dilatation the ventricles and sulci consistent with age-related atrophy. Low-attenuation changes in the deep white matter consistent with small vessel ischemia. Vascular: No hyperdense vessel or unexpected calcification. Skull: The skull is intact. No fracture or focal lesion identified. Sinuses/Orbits: The visualized paranasal sinuses and mastoid air cells are clear. The orbits and globes intact. Other: None IMPRESSION: No acute intracranial abnormality. Findings consistent with age related atrophy and chronic small vessel ischemia Electronically Signed   By: Prudencio Pair M.D.   On: 09/17/2020 19:14    IMPRESSION AND PLAN:  Active  Problems:   Syncope  1.  Syncope secondary to orthostatic hypotension. -The patient will be admitted to an observation medically monitored bed.   -Will check orthostatics q 12 hours.   -Will obtain a bilateral carotid Doppler and 2D echo.   -The patient will be gently hydrated with IV normal saline and monitored for arrhythmias. Differential diagnosis would include neurally mediated syncope, cardiogenic, arrhythmias related and less likely hypoglycemia.  2.  Hypotension likely contributing to #1. -The patient will be continued on hydration with IV normal saline.  3.  Thrombocytopenia. -I will hold off Eliquis and aspirin as the patient is high risk for bleeding.  4.  Hypokalemia. -Potassium will be replaced and magnesium level came back normal.  5.  Dyslipidemia. -We will continue statin therapy.  6.  GERD. -We will continue PPI therapy.  7.  History of hypertension. -Toprol-XL be resumed with holding parameters.  8.  Peripheral neuropathy. -We will continue Neurontin.  9.  Dementia. -We will continue Aricept.   DVT prophylaxis: SCDs.  Medical prophylaxis currently contraindicated due to significant thrombocytopenia.. Code Status: full code. Family Communication:  The plan of care was discussed in details with the patient (and family). I answered all questions. The patient agreed to proceed with the above mentioned plan. Further management will depend upon hospital course. Disposition Plan: Back to previous home environment Consults called: none.  All the records are reviewed and case discussed with ED provider.  Status is: Observation  The patient remains OBS appropriate and will d/c before 2 midnights.  Dispo: The patient is from: Home              Anticipated d/c is to: Home              Patient currently is not medically stable to d/c.   Difficult to place patient No   TOTAL TIME TAKING CARE OF THIS PATIENT: 55 minutes.    Christel Mormon M.D on 09/17/2020 at 7:39  PM  [facility]   From 7 PM-7 AM, contact night-coverage [email or web address]  CC: Primary care physician; [name], MD

## 2020-09-17 NOTE — ED Triage Notes (Signed)
Pt comes via EMS from home with c/o syncopal episode at home. Pt states she hit back of head. No LOC. Pt has hx of dementia. Pt A*O with EMS.  Pt denies any pain. Pt takes aspirin daily.  81/42 at first check for BP BP-112/69 CBG-128 O2-100 RA HR-67

## 2020-09-17 NOTE — Telephone Encounter (Signed)
Copied from [place] [phone]. Topic: General - Other >> Sep 17, 2020  2:27 PM Leward Quan A wrote: Reason for CRM: Patient niece called in to cancel her appointment patient fell and will be going to the [facility] by ambulance.  FYI.

## 2020-09-17 NOTE — ED Notes (Signed)
Gave pt urine cup. She could not get a sample at this time.

## 2020-09-18 ENCOUNTER — Encounter: Payer: Self-pay | Admitting: Family Medicine

## 2020-09-18 ENCOUNTER — Observation Stay (HOSPITAL_COMMUNITY)
Admit: 2020-09-18 | Discharge: 2020-09-18 | Disposition: A | Discharge status: Home / Self Care | Payer: PPO | Attending: Family Medicine | Admitting: Family Medicine

## 2020-09-18 DIAGNOSIS — Z86010 Personal history of colonic polyps: Secondary | ICD-10-CM | POA: Diagnosis not present

## 2020-09-18 DIAGNOSIS — L4050 Arthropathic psoriasis, unspecified: Secondary | ICD-10-CM | POA: Diagnosis present

## 2020-09-18 DIAGNOSIS — I714 Abdominal aortic aneurysm, without rupture: Secondary | ICD-10-CM | POA: Diagnosis present

## 2020-09-18 DIAGNOSIS — K219 Gastro-esophageal reflux disease without esophagitis: Secondary | ICD-10-CM | POA: Diagnosis present

## 2020-09-18 DIAGNOSIS — Y92009 Unspecified place in unspecified non-institutional (private) residence as the place of occurrence of the external cause: Secondary | ICD-10-CM | POA: Diagnosis not present

## 2020-09-18 DIAGNOSIS — J449 Chronic obstructive pulmonary disease, unspecified: Secondary | ICD-10-CM | POA: Diagnosis present

## 2020-09-18 DIAGNOSIS — E871 Hypo-osmolality and hyponatremia: Secondary | ICD-10-CM | POA: Diagnosis present

## 2020-09-18 DIAGNOSIS — D696 Thrombocytopenia, unspecified: Secondary | ICD-10-CM | POA: Diagnosis not present

## 2020-09-18 DIAGNOSIS — K121 Other forms of stomatitis: Secondary | ICD-10-CM | POA: Diagnosis present

## 2020-09-18 DIAGNOSIS — I951 Orthostatic hypotension: Secondary | ICD-10-CM | POA: Diagnosis present

## 2020-09-18 DIAGNOSIS — R55 Syncope and collapse: Secondary | ICD-10-CM | POA: Diagnosis present

## 2020-09-18 DIAGNOSIS — Z96641 Presence of right artificial hip joint: Secondary | ICD-10-CM | POA: Diagnosis present

## 2020-09-18 DIAGNOSIS — I5032 Chronic diastolic (congestive) heart failure: Secondary | ICD-10-CM | POA: Diagnosis present

## 2020-09-18 DIAGNOSIS — E876 Hypokalemia: Secondary | ICD-10-CM | POA: Diagnosis present

## 2020-09-18 DIAGNOSIS — E878 Other disorders of electrolyte and fluid balance, not elsewhere classified: Secondary | ICD-10-CM | POA: Diagnosis present

## 2020-09-18 DIAGNOSIS — Z881 Allergy status to other antibiotic agents status: Secondary | ICD-10-CM | POA: Diagnosis not present

## 2020-09-18 DIAGNOSIS — F17210 Nicotine dependence, cigarettes, uncomplicated: Secondary | ICD-10-CM | POA: Diagnosis present

## 2020-09-18 DIAGNOSIS — Z7901 Long term (current) use of anticoagulants: Secondary | ICD-10-CM | POA: Diagnosis not present

## 2020-09-18 DIAGNOSIS — Z86718 Personal history of other venous thrombosis and embolism: Secondary | ICD-10-CM | POA: Diagnosis not present

## 2020-09-18 DIAGNOSIS — Z880 Allergy status to penicillin: Secondary | ICD-10-CM | POA: Diagnosis not present

## 2020-09-18 DIAGNOSIS — Z8249 Family history of ischemic heart disease and other diseases of the circulatory system: Secondary | ICD-10-CM | POA: Diagnosis not present

## 2020-09-18 DIAGNOSIS — T451X5A Adverse effect of antineoplastic and immunosuppressive drugs, initial encounter: Secondary | ICD-10-CM | POA: Diagnosis present

## 2020-09-18 DIAGNOSIS — I110 Hypertensive heart disease with heart failure: Secondary | ICD-10-CM | POA: Diagnosis present

## 2020-09-18 DIAGNOSIS — Z8261 Family history of arthritis: Secondary | ICD-10-CM | POA: Diagnosis not present

## 2020-09-18 DIAGNOSIS — Z20822 Contact with and (suspected) exposure to covid-19: Secondary | ICD-10-CM | POA: Diagnosis present

## 2020-09-18 DIAGNOSIS — D61811 Other drug-induced pancytopenia: Secondary | ICD-10-CM | POA: Diagnosis present

## 2020-09-18 DIAGNOSIS — E785 Hyperlipidemia, unspecified: Secondary | ICD-10-CM | POA: Diagnosis present

## 2020-09-18 LAB — BASIC METABOLIC PANEL
Anion gap: 10 (ref 5–15)
BUN: 9 mg/dL (ref 8–23)
CO2: 27 mmol/L (ref 22–32)
Calcium: 8.8 mg/dL — ABNORMAL LOW (ref 8.9–10.3)
Chloride: 101 mmol/L (ref 98–111)
Creatinine, Ser: 0.91 mg/dL (ref 0.44–1.00)
GFR, Estimated: >60 mL/min (ref >60)
Glucose, Bld: 86 mg/dL (ref 70–99)
Potassium: 3.9 mmol/L (ref 3.5–5.1)
Sodium: 138 mmol/L (ref 135–145)

## 2020-09-18 LAB — ECHOCARDIOGRAM COMPLETE
AR max vel: 1.59 cm2
AV Area VTI: 1.57 cm2
AV Area mean vel: 1.68 cm2
AV Mean grad: 11 mmHg
AV Peak grad: 19.7 mmHg
Ao pk vel: 2.22 m/s
Area-P 1/2: 4.33 cm2
Height: 61 in
MV VTI: 2.65 cm2
S' Lateral: 2.7 cm
Weight: 1894.4 oz

## 2020-09-18 LAB — GLUCOSE, CAPILLARY: Glucose-Capillary: 79 mg/dL (ref 70–99)

## 2020-09-18 LAB — CBC
HCT: 28.6 % — ABNORMAL LOW (ref 36.0–46.0)
Hemoglobin: 10.3 g/dL — ABNORMAL LOW (ref 12.0–15.0)
MCH: 34.3 pg — ABNORMAL HIGH (ref 26.0–34.0)
MCHC: 36 g/dL (ref 30.0–36.0)
MCV: 95.3 fL (ref 80.0–100.0)
Platelets: 35 10*3/uL — ABNORMAL LOW (ref 150–400)
RBC: 3 MIL/uL — ABNORMAL LOW (ref 3.87–5.11)
RDW: 14.9 % (ref 11.5–15.5)
WBC: 3 10*3/uL — ABNORMAL LOW (ref 4.0–10.5)
nRBC: 0 % (ref 0.0–0.2)

## 2020-09-18 LAB — SARS CORONAVIRUS 2 (TAT 6-24 HRS): SARS Coronavirus 2: NEGATIVE

## 2020-09-18 MED ORDER — MAGIC MOUTHWASH W/LIDOCAINE
10.0000 mL | Freq: Three times a day (TID) | ORAL | Status: DC
  Administered 2020-09-18 – 2020-09-20 (×6): 10 mL via ORAL
  Filled 2020-09-18 (×9): qty 10

## 2020-09-18 MED ORDER — LORATADINE 10 MG PO TABS
10.0000 mg | ORAL_TABLET | Freq: Every day | ORAL | Status: DC
  Administered 2020-09-18 – 2020-09-20 (×3): 10 mg via ORAL
  Filled 2020-09-18 (×3): qty 1

## 2020-09-18 NOTE — Progress Notes (Signed)
Progress Note    Mariah [name]  JSH:[id number] DOB: 1946/05/03  DOA: 09/17/2020 PCP: [name], MD      Brief Narrative:    Medical records reviewed and are as summarized below:  Mariah [name] is a 75 y.o. female  with medical history significant for COPD, GERD, hypertension, dyslipidemia, abdominal aortic aneurysm, history of DVT on Eliquis, chronic diastolic CHF on Lasix, who presented to the emergency room with acute onset of syncope.      Assessment/Plan:   Principal Problem:   Syncope Active Problems:   Orthostatic hypotension   Body mass index is 22.37 kg/m.   S/p syncope, orthostatic hypotension: Syncope was likely due to orthostatic hypotension.  Continue IV fluids.  Lasix has been held.  Repeat orthostatic vital signs tomorrow.  Leukopenia, worsening thrombocytopenia: Hold methotrexate, Eliquis and aspirin.  Monitor CBC closely.  Oral ulcers: Differentials include oral thrush or mucositis from methotrexate.  Trial of Magic mouthwash.  Hold methotrexate for now.  Hypokalemia: Improved  Chronic diastolic CHF: Hold Lasix.  2D echo showed EF estimated at 55 to 85%, grade 1 diastolic dysfunction.  History of psoriasis and psoriatic arthritis: She takes methotrexate on Mondays.  Hold methotrexate for now.  It appears methotrexate was started on 08/29/2019 by rheumatologist.  Monitor CBC   Other comorbidities include hypertension, dyslipidemia, dementia, COPD, peripheral neuropathy, GERD  Diet Order            Diet regular Room service appropriate? Yes; Fluid consistency: Thin  Diet effective now                    Consultants:  None  Procedures:  None    Medications:   . calcipotriene   Topical BID  . cholecalciferol  2,000 Units Oral Daily  . donepezil  10 mg Oral Daily  . ferrous [id number] C-folic acid  1 capsule Oral BID PC  . ferrous sulfate  325 mg Oral BID PC  . fluticasone furoate-vilanterol  1 puff Inhalation  Daily  . gabapentin  300 mg Oral BID  . guaiFENesin  600 mg Oral BID  . loratadine  10 mg Oral Daily  . magic mouthwash w/lidocaine  10 mL Oral TID  . [START ON 09/23/2020] methotrexate  15 mg Oral Weekly  . metoprolol succinate  50 mg Oral Daily  . pantoprazole  40 mg Oral BID AC  . potassium chloride SA  40 mEq Oral Daily  . rOPINIRole  1 mg Oral QHS  . simvastatin  20 mg Oral q1800  . sodium chloride flush  3 mL Intravenous Q12H  . triamcinolone cream   Topical BID   Continuous Infusions: . sodium chloride 100 mL/hr at 09/18/20 1513     Anti-infectives (From admission, onward)   None             Family Communication/Anticipated D/C date and plan/Code Status   DVT prophylaxis: SCDs Start: 09/17/20 1928     Code Status: Full Code  Family Communication: None Disposition Plan:    Status is: Inpatient  Remains inpatient appropriate because:IV treatments appropriate due to intensity of illness or inability to take PO   Dispo: The patient is from: Home              Anticipated d/c is to: Home              Patient currently is not medically stable to d/c.   Difficult to place patient No  Subjective:   C/o oral pain.  She said she noticed that she had pain in the mouth yesterday.  This is the first time she has had it.  No prior history of oral thrush or vaginal thrush  Objective:    Vitals:   09/18/20 0440 09/18/20 0738 09/18/20 1128 09/18/20 1557  BP: 128/67 122/66 132/74 106/86  Pulse: 74 77 75 91  Resp: 16 16 18 18   Temp: 98.1 F (36.7 C) 98.5 F (36.9 C) 98.4 F (36.9 C) 97.6 F (36.4 C)  TempSrc:  Oral  Oral  SpO2: 95% 93% 100% 99%  Weight:      Height:       Orthostatic VS for the past 24 hrs:  BP- Lying Pulse- Lying BP- Sitting Pulse- Sitting BP- Standing at 0 minutes Pulse- Standing at 0 minutes  09/18/20 0945 137/80 75 131/72 86 (!) 80/60 108     Intake/Output Summary (Last 24 hours) at 09/18/2020 1701 Last data filed  at 09/18/2020 1513 Gross per 24 hour  Intake 1944.41 ml  Output 900 ml  Net 1044.41 ml   Filed Weights   09/18/20 0016  Weight: 53.7 kg    Exam:  GEN: NAD SKIN: No rash EYES: EOMI ENT: MMM.  Erosion/superficial ulcers on the lower lip.  Erythematous area noted in the right buccal mucosa CV: RRR PULM: CTA B ABD: soft, ND, NT, +BS CNS: AAO x 3, non focal EXT: No edema or tenderness        Data Reviewed:   I have personally reviewed following labs and imaging studies:  Labs: Labs show the following:   Basic Metabolic Panel: Recent Labs  Lab 09/17/20 1600 09/18/20 0603  NA 134* 138  K 3.0* 3.9  CL 97* 101  CO2 27 27  GLUCOSE 116* 86  BUN 10 9  CREATININE 1.03* 0.91  CALCIUM 9.1 8.8*  MG 2.2  --    GFR Estimated Creatinine Clearance: 39.7 mL/min (by C-G formula based on SCr of 0.91 mg/dL). Liver Function Tests: No results for input(s): AST, ALT, ALKPHOS, BILITOT, PROT, ALBUMIN in the last 168 hours. No results for input(s): LIPASE, AMYLASE in the last 168 hours. No results for input(s): AMMONIA in the last 168 hours. Coagulation profile No results for input(s): INR, PROTIME in the last 168 hours.  CBC: Recent Labs  Lab 09/17/20 1600 09/18/20 0603  WBC 5.0 3.0*  HGB 10.7* 10.3*  HCT 29.6* 28.6*  MCV 94.3 95.3  PLT 42* 35*   Cardiac Enzymes: No results for input(s): CKTOTAL, CKMB, CKMBINDEX, TROPONINI in the last 168 hours. BNP (last 3 results) No results for input(s): PROBNP in the last 8760 hours. CBG: Recent Labs  Lab 09/18/20 0739  GLUCAP 79   D-Dimer: No results for input(s): DDIMER in the last 72 hours. Hgb A1c: No results for input(s): HGBA1C in the last 72 hours. Lipid Profile: No results for input(s): CHOL, HDL, LDLCALC, TRIG, CHOLHDL, LDLDIRECT in the last 72 hours. Thyroid function studies: No results for input(s): TSH, T4TOTAL, T3FREE, THYROIDAB in the last 72 hours.  Invalid input(s): FREET3 Anemia work up: No results for  input(s): VITAMINB12, FOLATE, FERRITIN, TIBC, IRON, RETICCTPCT in the last 72 hours. Sepsis Labs: Recent Labs  Lab 09/17/20 1600 09/18/20 0603  WBC 5.0 3.0*    Microbiology Recent Results (from the past 240 hour(s))  SARS CORONAVIRUS 2 (TAT 6-24 HRS) Nasopharyngeal Nasopharyngeal Swab     Status: None   Collection Time: 09/17/20  6:42 PM  Specimen: Nasopharyngeal Swab  Result Value Ref Range Status   SARS Coronavirus 2 NEGATIVE NEGATIVE Final    Comment: (NOTE) SARS-CoV-2 target nucleic acids are NOT DETECTED.  The SARS-CoV-2 RNA is generally detectable in upper and lower respiratory specimens during the acute phase of infection. Negative results do not preclude SARS-CoV-2 infection, do not rule out co-infections with other pathogens, and should not be used as the sole basis for treatment or other patient management decisions. Negative results must be combined with clinical observations, patient history, and epidemiological information. The expected result is Negative.  Fact Sheet for Patients: SugarRoll.be  Fact Sheet for Healthcare Providers: https://www.woods-mathews.com/  This test is not yet approved or cleared by the [place] FDA and  has been authorized for detection and/or diagnosis of SARS-CoV-2 by FDA under an Emergency Use Authorization (EUA). This EUA will remain  in effect (meaning this test can be used) for the duration of the COVID-19 declaration under Se ction 564(b)(1) of the Act, 21 U.S.C. section 360bbb-3(b)(1), unless the authorization is terminated or revoked sooner.  Performed at [facility], [place] [address], [place], [place] [address]     Procedures and diagnostic studies:  DG Chest 2 View  Result Date: 09/17/2020 CLINICAL DATA:  Syncope, hit back of head, dimension EXAM: CHEST - 2 VIEW COMPARISON:  07/08/2020 FINDINGS: Frontal and lateral views of the chest demonstrate an unremarkable cardiac  silhouette. Stable atherosclerosis of the aortic arch. Background parenchymal lung scarring without airspace disease, effusion, or pneumothorax. Interval clearing of the right basilar pneumonia seen previously. IMPRESSION: 1. No acute intrathoracic process. Electronically Signed   By: Randa Ngo M.D.   On: 09/17/2020 17:12   CT Head Wo Contrast  Result Date: 09/17/2020 CLINICAL DATA:  Facial trauma EXAM: CT HEAD WITHOUT CONTRAST TECHNIQUE: Contiguous axial images were obtained from the base of the skull through the vertex without intravenous contrast. COMPARISON:  May 19, 2020 FINDINGS: Brain: No evidence of acute territorial infarction, hemorrhage, hydrocephalus,extra-axial collection or mass lesion/mass effect. There is dilatation the ventricles and sulci consistent with age-related atrophy. Low-attenuation changes in the deep white matter consistent with small vessel ischemia. Vascular: No hyperdense vessel or unexpected calcification. Skull: The skull is intact. No fracture or focal lesion identified. Sinuses/Orbits: The visualized paranasal sinuses and mastoid air cells are clear. The orbits and globes intact. Other: None IMPRESSION: No acute intracranial abnormality. Findings consistent with age related atrophy and chronic small vessel ischemia Electronically Signed   By: Prudencio Pair M.D.   On: 09/17/2020 19:14   US Carotid Bilateral  Result Date: 09/17/2020 CLINICAL DATA:  Hypertension, syncope, tobacco abuse EXAM: BILATERAL CAROTID DUPLEX ULTRASOUND TECHNIQUE: Pearline Cables scale imaging, color Doppler and duplex ultrasound were performed of bilateral carotid and vertebral arteries in the neck. COMPARISON:  None. FINDINGS: Criteria: Quantification of carotid stenosis is based on velocity parameters that correlate the residual internal carotid diameter with NASCET-based stenosis levels, using the diameter of the distal internal carotid lumen as the denominator for stenosis measurement. The following  velocity measurements were obtained: RIGHT ICA: 116/25 cm/sec CCA: 92/11 cm/sec SYSTOLIC ICA/CCA RATIO:  1.6 ECA: 94 cm/sec LEFT ICA: 116/45 cm/sec CCA: 94/17 cm/sec SYSTOLIC ICA/CCA RATIO:  1.8 ECA: 76 cm/sec RIGHT VERTEBRAL ARTERY:  Antegrade LEFT VERTEBRAL ARTERY:  Antegrade IMPRESSION: 1. No significant flow limiting stenosis within either carotid artery. Estimated 0-49% stenosis bilaterally. Electronically Signed   By: Randa Ngo M.D.   On: 09/17/2020 21:59   ECHOCARDIOGRAM COMPLETE  Result Date:  09/18/2020    ECHOCARDIOGRAM REPORT   Patient Name:   Mariah [name] Date of Exam: 09/18/2020 Medical Rec #:  [id number]     Height:       61.0 in Accession #:    [id number]    Weight:       118.4 lb Date of Birth:  22-Oct-1945      BSA:          1.512 m Patient Age:    30 years      BP:           122/66 mmHg Patient Gender: F             HR:           72 bpm. Exam Location:  [facility] Procedure: 2D Echo, Color Doppler, Cardiac Doppler and Strain Analysis Indications:     R55 Syncope  History:         Patient has prior history of Echocardiogram examinations, most                  recent 05/27/2020. CHF, COPD; Risk Factors:Hypertension and                  Dyslipidemia.  Sonographer:     Charmayne Sheer RDCS (AE) Referring Phys:  [id number] Arvella Merles MANSY Diagnosing Phys: Kate Sable MD  Sonographer Comments: Image acquisition challenging due to COPD. Global longitudinal strain was attempted. IMPRESSIONS  1. Left ventricular ejection fraction, by estimation, is 55 to 60%. The left ventricle has normal function. The left ventricle has no regional wall motion abnormalities. Left ventricular diastolic parameters are consistent with Grade I diastolic dysfunction (impaired relaxation). The average left ventricular global longitudinal strain is -14.8 %. The global longitudinal strain is abnormal.  2. Right ventricular systolic function is normal. The right ventricular size is normal.  3. The mitral valve is normal in structure. No  evidence of mitral valve regurgitation.  4. The aortic valve was not well visualized. Aortic valve regurgitation is not visualized. Mild aortic valve sclerosis is present, with no evidence of aortic valve stenosis. FINDINGS  Left Ventricle: Left ventricular ejection fraction, by estimation, is 55 to 60%. The left ventricle has normal function. The left ventricle has no regional wall motion abnormalities. The average left ventricular global longitudinal strain is -14.8 %. The global longitudinal strain is abnormal. The left ventricular internal cavity size was normal in size. There is no left ventricular hypertrophy. Left ventricular diastolic parameters are consistent with Grade I diastolic dysfunction (impaired relaxation). Right Ventricle: The right ventricular size is normal. No increase in right ventricular wall thickness. Right ventricular systolic function is normal. Left Atrium: Left atrial size was normal in size. Right Atrium: Right atrial size was normal in size. Pericardium: There is no evidence of pericardial effusion. Mitral Valve: The mitral valve is normal in structure. No evidence of mitral valve regurgitation. MV peak gradient, 5.4 mmHg. The mean mitral valve gradient is 3.0 mmHg. Tricuspid Valve: The tricuspid valve is not well visualized. Tricuspid valve regurgitation is not demonstrated. Aortic Valve: The aortic valve was not well visualized. Aortic valve regurgitation is not visualized. Mild aortic valve sclerosis is present, with no evidence of aortic valve stenosis. Aortic valve mean gradient measures 11.0 mmHg. Aortic valve peak gradient measures 19.7 mmHg. Aortic valve area, by VTI measures 1.57 cm. Pulmonic Valve: The pulmonic valve was not well visualized. Pulmonic valve regurgitation is not visualized. Aorta: The aortic root is normal in size and  structure. Venous: The inferior vena cava was not well visualized. IAS/Shunts: No atrial level shunt detected by color flow Doppler.  LEFT  VENTRICLE PLAX 2D LVIDd:         3.70 cm  Diastology LVIDs:         2.70 cm  LV e' medial:    4.57 cm/s LV PW:         0.90 cm  LV E/e' medial:  15.1 LV IVS:        0.80 cm  LV e' lateral:   6.96 cm/s LVOT diam:     2.10 cm  LV E/e' lateral: 9.9 LV SV:         72 LV SV Index:   48       2D Longitudinal Strain LVOT Area:     3.46 cm 2D Strain GLS Avg:     -14.8 %  RIGHT VENTRICLE RV Basal diam:  3.20 cm TAPSE (M-mode): 2.5 cm LEFT ATRIUM             Index LA diam:        3.30 cm 2.18 cm/m LA Vol (A2C):   31.2 ml 20.64 ml/m LA Vol (A4C):   32.6 ml 21.57 ml/m LA Biplane Vol: 32.3 ml 21.37 ml/m  AORTIC VALVE                    PULMONIC VALVE AV Area (Vmax):    1.59 cm     PV Vmax:       1.12 m/s AV Area (Vmean):   1.68 cm     PV Vmean:      74.000 cm/s AV Area (VTI):     1.57 cm     PV VTI:        0.231 m AV Vmax:           222.00 cm/s  PV Peak grad:  5.0 mmHg AV Vmean:          155.333 cm/s PV Mean grad:  2.0 mmHg AV VTI:            0.462 m AV Peak Grad:      19.7 mmHg AV Mean Grad:      11.0 mmHg LVOT Vmax:         102.00 cm/s LVOT Vmean:        75.500 cm/s LVOT VTI:          0.209 m LVOT/AV VTI ratio: 0.45  AORTA Ao Root diam: 3.20 cm MITRAL VALVE MV Area (PHT): 4.33 cm    SHUNTS MV Area VTI:   2.65 cm    Systemic VTI:  0.21 m MV Peak grad:  5.4 mmHg    Systemic Diam: 2.10 cm MV Mean grad:  3.0 mmHg MV Vmax:       1.16 m/s MV Vmean:      77.0 cm/s MV Decel Time: 175 msec MV E velocity: 69.00 cm/s MV A velocity: 85.30 cm/s MV E/A ratio:  0.81 Kate Sable MD Electronically signed by Kate Sable MD Signature Date/Time: 09/18/2020/2:35:58 PM    Final                LOS: 0 days   Magic Mohler  [facility]   Pager on [email or web address]. If 7PM-7AM, please contact night-coverage at [email or web address]     09/18/2020, 5:01 PM

## 2020-09-18 NOTE — Progress Notes (Signed)
*  PRELIMINARY RESULTS* Echocardiogram 2D Echocardiogram has been performed.  Mariah [name] 09/18/2020, 12:22 PM

## 2020-09-18 NOTE — Plan of Care (Signed)
Alert and oriented x 4. Calm and pleasant to talk to. Denies pain or discomfort. Admitted from ED. Oriented to room and call bell. Bed alarm in use. IV fluids started. Will continue to monitor. Problem: Education: Goal: Knowledge of General Education information will improve Description: Including pain rating scale, medication(s)/side effects and non-pharmacologic comfort measures Outcome: Progressing   Problem: Health Behavior/Discharge Planning: Goal: Ability to manage health-related needs will improve Outcome: Progressing   Problem: Clinical Measurements: Goal: Ability to maintain clinical measurements within normal limits will improve Outcome: Progressing Goal: Will remain free from infection Outcome: Progressing Goal: Diagnostic test results will improve Outcome: Progressing Goal: Respiratory complications will improve Outcome: Progressing Goal: Cardiovascular complication will be avoided Outcome: Progressing   Problem: Activity: Goal: Risk for activity intolerance will decrease Outcome: Progressing   Problem: Nutrition: Goal: Adequate nutrition will be maintained Outcome: Progressing   Problem: Coping: Goal: Level of anxiety will decrease Outcome: Progressing   Problem: Elimination: Goal: Will not experience complications related to bowel motility Outcome: Progressing Goal: Will not experience complications related to urinary retention Outcome: Progressing   Problem: Pain Managment: Goal: General experience of comfort will improve Outcome: Progressing   Problem: Safety: Goal: Ability to remain free from injury will improve Outcome: Progressing   Problem: Skin Integrity: Goal: Risk for impaired skin integrity will decrease Outcome: Progressing

## 2020-09-19 DIAGNOSIS — D696 Thrombocytopenia, unspecified: Secondary | ICD-10-CM | POA: Diagnosis present

## 2020-09-19 DIAGNOSIS — E876 Hypokalemia: Secondary | ICD-10-CM | POA: Diagnosis present

## 2020-09-19 LAB — GLUCOSE, CAPILLARY
Glucose-Capillary: 80 mg/dL (ref 70–99)
Glucose-Capillary: 106 mg/dL — ABNORMAL HIGH (ref 70–99)
Glucose-Capillary: 101 mg/dL — ABNORMAL HIGH (ref 70–99)

## 2020-09-19 LAB — CBC WITH DIFFERENTIAL/PLATELET
Abs Immature Granulocytes: 0.01 10*3/uL (ref 0.00–0.07)
Basophils Absolute: 0 10*3/uL (ref 0.0–0.1)
Basophils Relative: 0 %
Eosinophils Absolute: 0.1 10*3/uL (ref 0.0–0.5)
Eosinophils Relative: 4 %
HCT: 29.2 % — ABNORMAL LOW (ref 36.0–46.0)
Hemoglobin: 10.1 g/dL — ABNORMAL LOW (ref 12.0–15.0)
Immature Granulocytes: 0 %
Lymphocytes Relative: 27 %
Lymphs Abs: 0.9 10*3/uL (ref 0.7–4.0)
MCH: 33.7 pg (ref 26.0–34.0)
MCHC: 34.6 g/dL (ref 30.0–36.0)
MCV: 97.3 fL (ref 80.0–100.0)
Monocytes Absolute: 0.4 10*3/uL (ref 0.1–1.0)
Monocytes Relative: 11 %
Neutro Abs: 1.9 10*3/uL (ref 1.7–7.7)
Neutrophils Relative %: 58 %
Platelets: 57 10*3/uL — ABNORMAL LOW (ref 150–400)
RBC: 3 MIL/uL — ABNORMAL LOW (ref 3.87–5.11)
RDW: 15.1 % (ref 11.5–15.5)
WBC: 3.3 10*3/uL — ABNORMAL LOW (ref 4.0–10.5)
nRBC: 0 % (ref 0.0–0.2)

## 2020-09-19 LAB — BASIC METABOLIC PANEL
Anion gap: 7 (ref 5–15)
BUN: 7 mg/dL — ABNORMAL LOW (ref 8–23)
CO2: 27 mmol/L (ref 22–32)
Calcium: 8.8 mg/dL — ABNORMAL LOW (ref 8.9–10.3)
Chloride: 102 mmol/L (ref 98–111)
Creatinine, Ser: 0.99 mg/dL (ref 0.44–1.00)
GFR, Estimated: 59 mL/min — ABNORMAL LOW (ref >60)
Glucose, Bld: 92 mg/dL (ref 70–99)
Potassium: 3.1 mmol/L — ABNORMAL LOW (ref 3.5–5.1)
Sodium: 136 mmol/L (ref 135–145)

## 2020-09-19 LAB — MAGNESIUM: Magnesium: 1.8 mg/dL (ref 1.7–2.4)

## 2020-09-19 MED ORDER — METOPROLOL SUCCINATE ER 25 MG PO TB24
25.0000 mg | ORAL_TABLET | Freq: Every day | ORAL | Status: DC
  Administered 2020-09-19 – 2020-09-20 (×2): 25 mg via ORAL
  Filled 2020-09-19 (×2): qty 1

## 2020-09-19 MED ORDER — POTASSIUM CHLORIDE CRYS ER 20 MEQ PO TBCR
40.0000 meq | EXTENDED_RELEASE_TABLET | ORAL | Status: AC
  Administered 2020-09-19 (×2): 40 meq via ORAL
  Filled 2020-09-19 (×2): qty 2

## 2020-09-19 NOTE — Plan of Care (Signed)

## 2020-09-19 NOTE — Progress Notes (Addendum)
Progress Note    Mariah [name]  JQB:[id number] DOB: 02-24-1946  DOA: 09/17/2020 PCP: [name], MD      Brief Narrative:    Medical records reviewed and are as summarized below:  Mariah [name] is a 75 y.o. female  with medical history significant for COPD, GERD, hypertension, dyslipidemia, abdominal aortic aneurysm, history of DVT on Eliquis, chronic diastolic CHF on Lasix, who presented to the emergency room with acute onset of syncope.      Assessment/Plan:   Principal Problem:   Syncope Active Problems:   Leukopenia   Orthostatic hypotension   Thrombocytopenia (HCC)   Hypokalemia   Body mass index is 22.75 kg/m.   S/p syncope, orthostatic hypotension: Syncope was likely due to orthostatic hypotension.  She still orthostatic.  Continue IV fluids but discontinue fluids later today.  Lasix is still on hold.  Metoprolol has been decreased from 50 mg to 25 mg daily.  Leukopenia, thrombocytopenia: Improving.  Eliquis, aspirin and methotrexate on hold.  Monitor CBC closely.  Platelet count was one 149,000 about a month ago.  Oral ulcers: Differentials include oral thrush or mucositis from methotrexate.  Continue Magic mouthwash.  Hypokalemia: Replete potassium and monitor levels.  Chronic diastolic CHF: Hold Lasix.  2D echo showed EF estimated at 55 to 40%, grade 1 diastolic dysfunction.  History of psoriasis and psoriatic arthritis: She takes methotrexate on Mondays.  Hold methotrexate for now.  It appears methotrexate was started on 08/29/2019 by rheumatologist.  Monitor CBC   Other comorbidities include hypertension, dyslipidemia, dementia, COPD, peripheral neuropathy, GERD  Diet Order            Diet regular Room service appropriate? Yes; Fluid consistency: Thin  Diet effective now                    Consultants:  None  Procedures:  None    Medications:   . calcipotriene   Topical BID  . cholecalciferol  2,000 Units Oral Daily  .  donepezil  10 mg Oral Daily  . ferrous [id number] C-folic acid  1 capsule Oral BID PC  . ferrous sulfate  325 mg Oral BID PC  . fluticasone furoate-vilanterol  1 puff Inhalation Daily  . gabapentin  300 mg Oral BID  . guaiFENesin  600 mg Oral BID  . loratadine  10 mg Oral Daily  . magic mouthwash w/lidocaine  10 mL Oral TID  . metoprolol succinate  25 mg Oral Daily  . pantoprazole  40 mg Oral BID AC  . rOPINIRole  1 mg Oral QHS  . simvastatin  20 mg Oral q1800  . sodium chloride flush  3 mL Intravenous Q12H  . triamcinolone cream   Topical BID   Continuous Infusions: . sodium chloride 100 mL/hr at 09/19/20 2229     Anti-infectives (From admission, onward)   None             Family Communication/Anticipated D/C date and plan/Code Status   DVT prophylaxis: SCDs Start: 09/17/20 1928     Code Status: Full Code  Family Communication: None Disposition Plan:    Status is: Inpatient  Remains inpatient appropriate because:IV treatments appropriate due to intensity of illness or inability to take PO   Dispo: The patient is from: Home              Anticipated d/c is to: Home              Patient  currently is not medically stable to d/c.   Difficult to place patient No           Subjective:   Interval events noted. C/o oral pain.  She thinks it's slightly better with Magic mouthwash.  Objective:    Vitals:   09/19/20 0505 09/19/20 0801 09/19/20 1157 09/19/20 1527  BP: 125/72 126/70 (!) 112/94 137/66  Pulse: 72 72 70 72  Resp: 18 16 18 16   Temp: 98.2 F (36.8 C) (!) 97.4 F (36.3 C) 98.4 F (36.9 C) 98.2 F (36.8 C)  TempSrc: Oral Oral Oral Oral  SpO2: 96% 97% 95% 98%  Weight:      Height:       Orthostatic VS for the past 24 hrs:  BP- Lying Pulse- Lying BP- Sitting Pulse- Sitting BP- Standing at 0 minutes Pulse- Standing at 0 minutes  09/19/20 0338 128/68 74 128/73 77 94/68 77     Intake/Output Summary (Last 24 hours) at 09/19/2020  1603 Last data filed at 09/19/2020 1404 Gross per 24 hour  Intake 243 ml  Output 600 ml  Net -357 ml   Filed Weights   09/18/20 0016 09/19/20 0357  Weight: 53.7 kg 54.6 kg    Exam:  GEN: NAD SKIN: Warm and dry EYES: EOMI ENT: MMM.  Erosions/superficial ulcers on the lower lip. CV: RRR PULM: CTA B ABD: soft, ND, NT, +BS CNS: AAO x 3, non focal EXT: No edema or tenderness         Data Reviewed:   I have personally reviewed following labs and imaging studies:  Labs: Labs show the following:   Basic Metabolic Panel: Recent Labs  Lab 09/17/20 1600 09/18/20 0603 09/19/20 0651  NA 134* 138 136  K 3.0* 3.9 3.1*  CL 97* 101 102  CO2 27 27 27   GLUCOSE 116* 86 92  BUN 10 9 7*  CREATININE 1.03* 0.91 0.99  CALCIUM 9.1 8.8* 8.8*  MG 2.2  --  1.8   GFR Estimated Creatinine Clearance: 36.5 mL/min (by C-G formula based on SCr of 0.99 mg/dL). Liver Function Tests: No results for input(s): AST, ALT, ALKPHOS, BILITOT, PROT, ALBUMIN in the last 168 hours. No results for input(s): LIPASE, AMYLASE in the last 168 hours. No results for input(s): AMMONIA in the last 168 hours. Coagulation profile No results for input(s): INR, PROTIME in the last 168 hours.  CBC: Recent Labs  Lab 09/17/20 1600 09/18/20 0603 09/19/20 0651  WBC 5.0 3.0* 3.3*  NEUTROABS  --   --  1.9  HGB 10.7* 10.3* 10.1*  HCT 29.6* 28.6* 29.2*  MCV 94.3 95.3 97.3  PLT 42* 35* 57*   Cardiac Enzymes: No results for input(s): CKTOTAL, CKMB, CKMBINDEX, TROPONINI in the last 168 hours. BNP (last 3 results) No results for input(s): PROBNP in the last 8760 hours. CBG: Recent Labs  Lab 09/18/20 0739 09/19/20 0535  GLUCAP 79 80   D-Dimer: No results for input(s): DDIMER in the last 72 hours. Hgb A1c: No results for input(s): HGBA1C in the last 72 hours. Lipid Profile: No results for input(s): CHOL, HDL, LDLCALC, TRIG, CHOLHDL, LDLDIRECT in the last 72 hours. Thyroid function studies: No results  for input(s): TSH, T4TOTAL, T3FREE, THYROIDAB in the last 72 hours.  Invalid input(s): FREET3 Anemia work up: No results for input(s): VITAMINB12, FOLATE, FERRITIN, TIBC, IRON, RETICCTPCT in the last 72 hours. Sepsis Labs: Recent Labs  Lab 09/17/20 1600 09/18/20 0603 09/19/20 0651  WBC 5.0 3.0* 3.3*  Microbiology Recent Results (from the past 240 hour(s))  SARS CORONAVIRUS 2 (TAT 6-24 HRS) Nasopharyngeal Nasopharyngeal Swab     Status: None   Collection Time: 09/17/20  6:42 PM   Specimen: Nasopharyngeal Swab  Result Value Ref Range Status   SARS Coronavirus 2 NEGATIVE NEGATIVE Final    Comment: (NOTE) SARS-CoV-2 target nucleic acids are NOT DETECTED.  The SARS-CoV-2 RNA is generally detectable in upper and lower respiratory specimens during the acute phase of infection. Negative results do not preclude SARS-CoV-2 infection, do not rule out co-infections with other pathogens, and should not be used as the sole basis for treatment or other patient management decisions. Negative results must be combined with clinical observations, patient history, and epidemiological information. The expected result is Negative.  Fact Sheet for Patients: SugarRoll.be  Fact Sheet for Healthcare Providers: https://www.woods-mathews.com/  This test is not yet approved or cleared by the [place] FDA and  has been authorized for detection and/or diagnosis of SARS-CoV-2 by FDA under an Emergency Use Authorization (EUA). This EUA will remain  in effect (meaning this test can be used) for the duration of the COVID-19 declaration under Se ction 564(b)(1) of the Act, 21 U.S.C. section 360bbb-3(b)(1), unless the authorization is terminated or revoked sooner.  Performed at [facility], [place] [address] [place], [place] [address]     Procedures and diagnostic studies:  DG Chest 2 View  Result Date: 09/17/2020 CLINICAL DATA:  Syncope, hit back  of head, dimension EXAM: CHEST - 2 VIEW COMPARISON:  07/08/2020 FINDINGS: Frontal and lateral views of the chest demonstrate an unremarkable cardiac silhouette. Stable atherosclerosis of the aortic arch. Background parenchymal lung scarring without airspace disease, effusion, or pneumothorax. Interval clearing of the right basilar pneumonia seen previously. IMPRESSION: 1. No acute intrathoracic process. Electronically Signed   By: Randa Ngo M.D.   On: 09/17/2020 17:12   CT Head Wo Contrast  Result Date: 09/17/2020 CLINICAL DATA:  Facial trauma EXAM: CT HEAD WITHOUT CONTRAST TECHNIQUE: Contiguous axial images were obtained from the base of the skull through the vertex without intravenous contrast. COMPARISON:  May 19, 2020 FINDINGS: Brain: No evidence of acute territorial infarction, hemorrhage, hydrocephalus,extra-axial collection or mass lesion/mass effect. There is dilatation the ventricles and sulci consistent with age-related atrophy. Low-attenuation changes in the deep white matter consistent with small vessel ischemia. Vascular: No hyperdense vessel or unexpected calcification. Skull: The skull is intact. No fracture or focal lesion identified. Sinuses/Orbits: The visualized paranasal sinuses and mastoid air cells are clear. The orbits and globes intact. Other: None IMPRESSION: No acute intracranial abnormality. Findings consistent with age related atrophy and chronic small vessel ischemia Electronically Signed   By: Prudencio Pair M.D.   On: 09/17/2020 19:14   US Carotid Bilateral  Result Date: 09/17/2020 CLINICAL DATA:  Hypertension, syncope, tobacco abuse EXAM: BILATERAL CAROTID DUPLEX ULTRASOUND TECHNIQUE: Pearline Cables scale imaging, color Doppler and duplex ultrasound were performed of bilateral carotid and vertebral arteries in the neck. COMPARISON:  None. FINDINGS: Criteria: Quantification of carotid stenosis is based on velocity parameters that correlate the residual internal carotid diameter with  NASCET-based stenosis levels, using the diameter of the distal internal carotid lumen as the denominator for stenosis measurement. The following velocity measurements were obtained: RIGHT ICA: 116/25 cm/sec CCA: 00/17 cm/sec SYSTOLIC ICA/CCA RATIO:  1.6 ECA: 94 cm/sec LEFT ICA: 116/45 cm/sec CCA: 49/44 cm/sec SYSTOLIC ICA/CCA RATIO:  1.8 ECA: 76 cm/sec RIGHT VERTEBRAL ARTERY:  Antegrade LEFT VERTEBRAL ARTERY:  Antegrade IMPRESSION: 1.  No significant flow limiting stenosis within either carotid artery. Estimated 0-49% stenosis bilaterally. Electronically Signed   By: Randa Ngo M.D.   On: 09/17/2020 21:59   ECHOCARDIOGRAM COMPLETE  Result Date: 09/18/2020    ECHOCARDIOGRAM REPORT   Patient Name:   Mariah [name] Date of Exam: 09/18/2020 Medical Rec #:  [id number]     Height:       61.0 in Accession #:    [id number]    Weight:       118.4 lb Date of Birth:  April 30, 1946      BSA:          1.512 m Patient Age:    51 years      BP:           122/66 mmHg Patient Gender: F             HR:           72 bpm. Exam Location:  [facility] Procedure: 2D Echo, Color Doppler, Cardiac Doppler and Strain Analysis Indications:     R55 Syncope  History:         Patient has prior history of Echocardiogram examinations, most                  recent 05/27/2020. CHF, COPD; Risk Factors:Hypertension and                  Dyslipidemia.  Sonographer:     Charmayne Sheer RDCS (AE) Referring Phys:  [id number] Arvella Merles MANSY Diagnosing Phys: Kate Sable MD  Sonographer Comments: Image acquisition challenging due to COPD. Global longitudinal strain was attempted. IMPRESSIONS  1. Left ventricular ejection fraction, by estimation, is 55 to 60%. The left ventricle has normal function. The left ventricle has no regional wall motion abnormalities. Left ventricular diastolic parameters are consistent with Grade I diastolic dysfunction (impaired relaxation). The average left ventricular global longitudinal strain is -14.8 %. The global longitudinal strain is  abnormal.  2. Right ventricular systolic function is normal. The right ventricular size is normal.  3. The mitral valve is normal in structure. No evidence of mitral valve regurgitation.  4. The aortic valve was not well visualized. Aortic valve regurgitation is not visualized. Mild aortic valve sclerosis is present, with no evidence of aortic valve stenosis. FINDINGS  Left Ventricle: Left ventricular ejection fraction, by estimation, is 55 to 60%. The left ventricle has normal function. The left ventricle has no regional wall motion abnormalities. The average left ventricular global longitudinal strain is -14.8 %. The global longitudinal strain is abnormal. The left ventricular internal cavity size was normal in size. There is no left ventricular hypertrophy. Left ventricular diastolic parameters are consistent with Grade I diastolic dysfunction (impaired relaxation). Right Ventricle: The right ventricular size is normal. No increase in right ventricular wall thickness. Right ventricular systolic function is normal. Left Atrium: Left atrial size was normal in size. Right Atrium: Right atrial size was normal in size. Pericardium: There is no evidence of pericardial effusion. Mitral Valve: The mitral valve is normal in structure. No evidence of mitral valve regurgitation. MV peak gradient, 5.4 mmHg. The mean mitral valve gradient is 3.0 mmHg. Tricuspid Valve: The tricuspid valve is not well visualized. Tricuspid valve regurgitation is not demonstrated. Aortic Valve: The aortic valve was not well visualized. Aortic valve regurgitation is not visualized. Mild aortic valve sclerosis is present, with no evidence of aortic valve stenosis. Aortic valve mean gradient measures 11.0 mmHg. Aortic valve peak gradient measures  19.7 mmHg. Aortic valve area, by VTI measures 1.57 cm. Pulmonic Valve: The pulmonic valve was not well visualized. Pulmonic valve regurgitation is not visualized. Aorta: The aortic root is normal in size  and structure. Venous: The inferior vena cava was not well visualized. IAS/Shunts: No atrial level shunt detected by color flow Doppler.  LEFT VENTRICLE PLAX 2D LVIDd:         3.70 cm  Diastology LVIDs:         2.70 cm  LV e' medial:    4.57 cm/s LV PW:         0.90 cm  LV E/e' medial:  15.1 LV IVS:        0.80 cm  LV e' lateral:   6.96 cm/s LVOT diam:     2.10 cm  LV E/e' lateral: 9.9 LV SV:         72 LV SV Index:   48       2D Longitudinal Strain LVOT Area:     3.46 cm 2D Strain GLS Avg:     -14.8 %  RIGHT VENTRICLE RV Basal diam:  3.20 cm TAPSE (M-mode): 2.5 cm LEFT ATRIUM             Index LA diam:        3.30 cm 2.18 cm/m LA Vol (A2C):   31.2 ml 20.64 ml/m LA Vol (A4C):   32.6 ml 21.57 ml/m LA Biplane Vol: 32.3 ml 21.37 ml/m  AORTIC VALVE                    PULMONIC VALVE AV Area (Vmax):    1.59 cm     PV Vmax:       1.12 m/s AV Area (Vmean):   1.68 cm     PV Vmean:      74.000 cm/s AV Area (VTI):     1.57 cm     PV VTI:        0.231 m AV Vmax:           222.00 cm/s  PV Peak grad:  5.0 mmHg AV Vmean:          155.333 cm/s PV Mean grad:  2.0 mmHg AV VTI:            0.462 m AV Peak Grad:      19.7 mmHg AV Mean Grad:      11.0 mmHg LVOT Vmax:         102.00 cm/s LVOT Vmean:        75.500 cm/s LVOT VTI:          0.209 m LVOT/AV VTI ratio: 0.45  AORTA Ao Root diam: 3.20 cm MITRAL VALVE MV Area (PHT): 4.33 cm    SHUNTS MV Area VTI:   2.65 cm    Systemic VTI:  0.21 m MV Peak grad:  5.4 mmHg    Systemic Diam: 2.10 cm MV Mean grad:  3.0 mmHg MV Vmax:       1.16 m/s MV Vmean:      77.0 cm/s MV Decel Time: 175 msec MV E velocity: 69.00 cm/s MV A velocity: 85.30 cm/s MV E/A ratio:  0.81 Kate Sable MD Electronically signed by Kate Sable MD Signature Date/Time: 09/18/2020/2:35:58 PM    Final                LOS: 1 day   Djibril Glogowski  [facility]   Pager on [email or web address]. If 7PM-7AM, please contact  night-coverage at [email or web address]     09/19/2020, 4:03 PM

## 2020-09-19 NOTE — Care Management Important Message (Signed)
Important Message  Patient Details  Name: MAIREN WALLENSTEIN MRN: [id number] Date of Birth: September 20, 1945   Medicare Important Message Given:  N/A - LOS <3 / Initial given by admissions  Initial Medicare IM reviewed with patient by Meredith Mody, Patient Access Associate on 09/18/2020 at 12:19pm.    Dannette Barbara 09/19/2020, 7:27 PM

## 2020-09-20 ENCOUNTER — Telehealth: Payer: Self-pay

## 2020-09-20 LAB — CBC WITH DIFFERENTIAL/PLATELET
Abs Immature Granulocytes: 0.02 10*3/uL (ref 0.00–0.07)
Basophils Absolute: 0 10*3/uL (ref 0.0–0.1)
Basophils Relative: 0 %
Eosinophils Absolute: 0.1 10*3/uL (ref 0.0–0.5)
Eosinophils Relative: 4 %
HCT: 26.5 % — ABNORMAL LOW (ref 36.0–46.0)
Hemoglobin: 9.4 g/dL — ABNORMAL LOW (ref 12.0–15.0)
Immature Granulocytes: 1 %
Lymphocytes Relative: 23 %
Lymphs Abs: 0.7 10*3/uL (ref 0.7–4.0)
MCH: 34.2 pg — ABNORMAL HIGH (ref 26.0–34.0)
MCHC: 35.5 g/dL (ref 30.0–36.0)
MCV: 96.4 fL (ref 80.0–100.0)
Monocytes Absolute: 0.4 10*3/uL (ref 0.1–1.0)
Monocytes Relative: 14 %
Neutro Abs: 1.8 10*3/uL (ref 1.7–7.7)
Neutrophils Relative %: 58 %
Platelets: 104 10*3/uL — ABNORMAL LOW (ref 150–400)
RBC: 2.75 MIL/uL — ABNORMAL LOW (ref 3.87–5.11)
RDW: 15.1 % (ref 11.5–15.5)
WBC: 3 10*3/uL — ABNORMAL LOW (ref 4.0–10.5)
nRBC: 0 % (ref 0.0–0.2)

## 2020-09-20 LAB — GLUCOSE, CAPILLARY: Glucose-Capillary: 88 mg/dL (ref 70–99)

## 2020-09-20 LAB — BASIC METABOLIC PANEL
Anion gap: 7 (ref 5–15)
BUN: 8 mg/dL (ref 8–23)
CO2: 24 mmol/L (ref 22–32)
Calcium: 8.4 mg/dL — ABNORMAL LOW (ref 8.9–10.3)
Chloride: 108 mmol/L (ref 98–111)
Creatinine, Ser: 0.86 mg/dL (ref 0.44–1.00)
GFR, Estimated: >60 mL/min (ref >60)
Glucose, Bld: 100 mg/dL — ABNORMAL HIGH (ref 70–99)
Potassium: 3.3 mmol/L — ABNORMAL LOW (ref 3.5–5.1)
Sodium: 139 mmol/L (ref 135–145)

## 2020-09-20 MED ORDER — METOPROLOL SUCCINATE ER 50 MG PO TB24: 25.0000 mg | ORAL_TABLET | Freq: Every day | ORAL | 0 refills | Status: DC

## 2020-09-20 MED ORDER — MAGIC MOUTHWASH W/LIDOCAINE: 10.0000 mL | Freq: Three times a day (TID) | ORAL | 0 refills | Status: AC

## 2020-09-20 NOTE — Discharge Summary (Addendum)
Physician Discharge Summary  Mariah [name] MGQ:[id number] DOB: 1946-03-17 DOA: 09/17/2020  PCP: [name], MD  Admit date: 09/17/2020 Discharge date: 09/20/2020  Discharge disposition: Home with home health PT   Recommendations for Outpatient Follow-Up:   Follow-up with PCP in 1 week   Discharge Diagnosis:   Principal Problem:   Syncope Active Problems:   Leukopenia   Orthostatic hypotension   Thrombocytopenia (HCC)   Hypokalemia    Discharge Condition: Stable.  Diet recommendation:  Diet Order            Diet - low sodium heart healthy           Diet regular Room service appropriate? Yes; Fluid consistency: Thin  Diet effective now                   Code Status: Full Code     Hospital Course:   Mariah [name] is a 75 y.o. 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  She had orthostatic hypotension which was likely the cause of her syncope.  She was taking Lasix prior to admission and this was held.  She was given IV fluids for hydration.  She was able to ambulate with physical therapist without any dizziness or syncopal episode.  Orthostatic hypotension has improved.    She was also found to have pancytopenia with significant thrombocytopenia.  She said she takes methotrexate on Mondays.  She was on Eliquis and this was held.  Etiology of pancytopenia is not clear but it could possibly be due to methotrexate.  She complained of oral pain.  Exam revealed erosions/superficial ulcers mainly in the lower lip.  She was treated with Magic mouthwash and oral pain has improved. She has been advised to follow-up with her PCP so that her CBC can be monitored closely.  She was also advised to discuss methotrexate with her  rheumatologist/PCP.  She had hypokalemia was treated and she is already on potassium supplement at home.  Her condition has improved and she is deemed stable for discharge to home today.  Discharge plan was discussed with the patient and her niece Getchell) at the bedside.    Discharge Exam:    Vitals:   09/19/20 1957 09/19/20 2332 09/20/20 0418 09/20/20 0811  BP: 129/74 127/79 126/67 128/78  Pulse: 79 79 72 69  Resp:  20 20 17   Temp:  98.1 F (36.7 C) (!) 97.5 F (36.4 C) 98.5 F (36.9 C)  TempSrc:  Oral  Oral  SpO2: 95% 100% 94% 97%  Weight:   56.2 kg   Height:         GEN: NAD SKIN: Psoriatic rash on her back EYES: EOMI ENT: MMM.  Erosions noted on the left side of the lower lip CV: RRR PULM: CTA B ABD: soft, ND, NT, +BS CNS: AAO x 3, non focal EXT: No edema or tenderness   The results of significant diagnostics from this hospitalization (including imaging, microbiology, ancillary and laboratory) are listed below for reference.     Procedures and Diagnostic Studies:   ECHOCARDIOGRAM COMPLETE  Result Date: 09/18/2020    ECHOCARDIOGRAM REPORT   Patient Name:   Mariah [name] Date of Exam: 09/18/2020 Medical Rec #:  [id number]     Height:  61.0 in Accession #:    [id number]    Weight:       118.4 lb Date of Birth:  10-03-1945      BSA:          1.512 m Patient Age:    75 years      BP:           122/66 mmHg Patient Gender: F             HR:           72 bpm. Exam Location:  [facility] Procedure: 2D Echo, Color Doppler, Cardiac Doppler and Strain Analysis Indications:     R55 Syncope  History:         Patient has prior history of Echocardiogram examinations, most                  recent 05/27/2020. CHF, COPD; Risk Factors:Hypertension and                  Dyslipidemia.  Sonographer:     Charmayne Sheer RDCS (AE) Referring Phys:  [id number] Arvella Merles MANSY Diagnosing Phys: Kate Sable MD  Sonographer Comments: Image acquisition challenging due to COPD. Global longitudinal strain was  attempted. IMPRESSIONS  1. Left ventricular ejection fraction, by estimation, is 55 to 60%. The left ventricle has normal function. The left ventricle has no regional wall motion abnormalities. Left ventricular diastolic parameters are consistent with Grade I diastolic dysfunction (impaired relaxation). The average left ventricular global longitudinal strain is -14.8 %. The global longitudinal strain is abnormal.  2. Right ventricular systolic function is normal. The right ventricular size is normal.  3. The mitral valve is normal in structure. No evidence of mitral valve regurgitation.  4. The aortic valve was not well visualized. Aortic valve regurgitation is not visualized. Mild aortic valve sclerosis is present, with no evidence of aortic valve stenosis. FINDINGS  Left Ventricle: Left ventricular ejection fraction, by estimation, is 55 to 60%. The left ventricle has normal function. The left ventricle has no regional wall motion abnormalities. The average left ventricular global longitudinal strain is -14.8 %. The global longitudinal strain is abnormal. The left ventricular internal cavity size was normal in size. There is no left ventricular hypertrophy. Left ventricular diastolic parameters are consistent with Grade I diastolic dysfunction (impaired relaxation). Right Ventricle: The right ventricular size is normal. No increase in right ventricular wall thickness. Right ventricular systolic function is normal. Left Atrium: Left atrial size was normal in size. Right Atrium: Right atrial size was normal in size. Pericardium: There is no evidence of pericardial effusion. Mitral Valve: The mitral valve is normal in structure. No evidence of mitral valve regurgitation. MV peak gradient, 5.4 mmHg. The mean mitral valve gradient is 3.0 mmHg. Tricuspid Valve: The tricuspid valve is not well visualized. Tricuspid valve regurgitation is not demonstrated. Aortic Valve: The aortic valve was not well visualized. Aortic  valve regurgitation is not visualized. Mild aortic valve sclerosis is present, with no evidence of aortic valve stenosis. Aortic valve mean gradient measures 11.0 mmHg. Aortic valve peak gradient measures 19.7 mmHg. Aortic valve area, by VTI measures 1.57 cm. Pulmonic Valve: The pulmonic valve was not well visualized. Pulmonic valve regurgitation is not visualized. Aorta: The aortic root is normal in size and structure. Venous: The inferior vena cava was not well visualized. IAS/Shunts: No atrial level shunt detected by color flow Doppler.  LEFT VENTRICLE PLAX 2D LVIDd:  3.70 cm  Diastology LVIDs:         2.70 cm  LV e' medial:    4.57 cm/s LV PW:         0.90 cm  LV E/e' medial:  15.1 LV IVS:        0.80 cm  LV e' lateral:   6.96 cm/s LVOT diam:     2.10 cm  LV E/e' lateral: 9.9 LV SV:         72 LV SV Index:   48       2D Longitudinal Strain LVOT Area:     3.46 cm 2D Strain GLS Avg:     -14.8 %  RIGHT VENTRICLE RV Basal diam:  3.20 cm TAPSE (M-mode): 2.5 cm LEFT ATRIUM             Index LA diam:        3.30 cm 2.18 cm/m LA Vol (A2C):   31.2 ml 20.64 ml/m LA Vol (A4C):   32.6 ml 21.57 ml/m LA Biplane Vol: 32.3 ml 21.37 ml/m  AORTIC VALVE                    PULMONIC VALVE AV Area (Vmax):    1.59 cm     PV Vmax:       1.12 m/s AV Area (Vmean):   1.68 cm     PV Vmean:      74.000 cm/s AV Area (VTI):     1.57 cm     PV VTI:        0.231 m AV Vmax:           222.00 cm/s  PV Peak grad:  5.0 mmHg AV Vmean:          155.333 cm/s PV Mean grad:  2.0 mmHg AV VTI:            0.462 m AV Peak Grad:      19.7 mmHg AV Mean Grad:      11.0 mmHg LVOT Vmax:         102.00 cm/s LVOT Vmean:        75.500 cm/s LVOT VTI:          0.209 m LVOT/AV VTI ratio: 0.45  AORTA Ao Root diam: 3.20 cm MITRAL VALVE MV Area (PHT): 4.33 cm    SHUNTS MV Area VTI:   2.65 cm    Systemic VTI:  0.21 m MV Peak grad:  5.4 mmHg    Systemic Diam: 2.10 cm MV Mean grad:  3.0 mmHg MV Vmax:       1.16 m/s MV Vmean:      77.0 cm/s MV Decel Time:  175 msec MV E velocity: 69.00 cm/s MV A velocity: 85.30 cm/s MV E/A ratio:  0.81 Kate Sable MD Electronically signed by Kate Sable MD Signature Date/Time: 09/18/2020/2:35:58 PM    Final      Labs:   Basic Metabolic Panel: Recent Labs  Lab 09/17/20 1600 09/18/20 0603 09/19/20 0651 09/20/20 0510  NA 134* 138 136 139  K 3.0* 3.9 3.1* 3.3*  CL 97* 101 102 108  CO2 27 27 27 24   GLUCOSE 116* 86 92 100*  BUN 10 9 7* 8  CREATININE 1.03* 0.91 0.99 0.86  CALCIUM 9.1 8.8* 8.8* 8.4*  MG 2.2  --  1.8  --    GFR Estimated Creatinine Clearance: 42 mL/min (by C-G formula based on SCr of 0.86 mg/dL). Liver Function Tests: No  results for input(s): AST, ALT, ALKPHOS, BILITOT, PROT, ALBUMIN in the last 168 hours. No results for input(s): LIPASE, AMYLASE in the last 168 hours. No results for input(s): AMMONIA in the last 168 hours. Coagulation profile No results for input(s): INR, PROTIME in the last 168 hours.  CBC: Recent Labs  Lab 09/17/20 1600 09/18/20 0603 09/19/20 0651 09/20/20 0510  WBC 5.0 3.0* 3.3* 3.0*  NEUTROABS  --   --  1.9 1.8  HGB 10.7* 10.3* 10.1* 9.4*  HCT 29.6* 28.6* 29.2* 26.5*  MCV 94.3 95.3 97.3 96.4  PLT 42* 35* 57* 104*   Cardiac Enzymes: No results for input(s): CKTOTAL, CKMB, CKMBINDEX, TROPONINI in the last 168 hours. BNP: Invalid input(s): POCBNP CBG: Recent Labs  Lab 09/18/20 0739 09/19/20 0535 09/19/20 2014 09/19/20 2325 09/20/20 0420  GLUCAP 79 80 106* 101* 88   D-Dimer No results for input(s): DDIMER in the last 72 hours. Hgb A1c No results for input(s): HGBA1C in the last 72 hours. Lipid Profile No results for input(s): CHOL, HDL, LDLCALC, TRIG, CHOLHDL, LDLDIRECT in the last 72 hours. Thyroid function studies No results for input(s): TSH, T4TOTAL, T3FREE, THYROIDAB in the last 72 hours.  Invalid input(s): FREET3 Anemia work up No results for input(s): VITAMINB12, FOLATE, FERRITIN, TIBC, IRON, RETICCTPCT in the last 72  hours. Microbiology Recent Results (from the past 240 hour(s))  SARS CORONAVIRUS 2 (TAT 6-24 HRS) Nasopharyngeal Nasopharyngeal Swab     Status: None   Collection Time: 09/17/20  6:42 PM   Specimen: Nasopharyngeal Swab  Result Value Ref Range Status   SARS Coronavirus 2 NEGATIVE NEGATIVE Final    Comment: (NOTE) SARS-CoV-2 target nucleic acids are NOT DETECTED.  The SARS-CoV-2 RNA is generally detectable in upper and lower respiratory specimens during the acute phase of infection. Negative results do not preclude SARS-CoV-2 infection, do not rule out co-infections with other pathogens, and should not be used as the sole basis for treatment or other patient management decisions. Negative results must be combined with clinical observations, patient history, and epidemiological information. The expected result is Negative.  Fact Sheet for Patients: SugarRoll.be  Fact Sheet for Healthcare Providers: https://www.woods-mathews.com/  This test is not yet approved or cleared by the [place] FDA and  has been authorized for detection and/or diagnosis of SARS-CoV-2 by FDA under an Emergency Use Authorization (EUA). This EUA will remain  in effect (meaning this test can be used) for the duration of the COVID-19 declaration under Se ction 564(b)(1) of the Act, 21 U.S.C. section 360bbb-3(b)(1), unless the authorization is terminated or revoked sooner.  Performed at [facility], [place] [address], [place], [place] [address]      Discharge Instructions:   Discharge Instructions    Diet - low sodium heart healthy   Complete by: As directed    Discharge instructions   Complete by: As directed    Monitor CBC on methotrexate   Face-to-face encounter (required for Medicare/Medicaid patients)   Complete by: As directed    I Ayman Brull certify that this patient is under my care and that I, or a nurse practitioner or physician's assistant  working with me, had a face-to-face encounter that meets the physician face-to-face encounter requirements with this patient on 09/20/2020. The encounter with the patient was in whole, or in part for the following medical condition(s) which is the primary reason for home health care (List medical condition): unsteady gait, orthostatic hypotension   The encounter with the patient was in whole,  or in part, for the following medical condition, which is the primary reason for home health care: unsteady gait, orthostatic hypotension   I certify that, based on my findings, the following services are medically necessary home health services: Physical therapy   Reason for Medically Villalba: Therapy- Personnel officer, Training and development officer and Stair Training   My clinical findings support the need for the above services: Unsafe ambulation due to balance issues   Further, I certify that my clinical findings support that this patient is homebound due to: Unsafe ambulation due to balance issues   Home Health   Complete by: As directed    To provide the following care/treatments: PT   Increase activity slowly   Complete by: As directed      Allergies as of 09/20/2020      Reactions   Amoxicillin Diarrhea   Clindamycin/lincomycin    diarrhea      Medication List    STOP taking these medications   aspirin EC 81 MG tablet   ferrous [id number] C-folic acid capsule Commonly known as: TRINSICON / FOLTRIN   guaiFENesin 600 MG 12 hr tablet Commonly known as: MUCINEX     TAKE these medications   albuterol 108 (90 Base) MCG/ACT inhaler Commonly known as: VENTOLIN HFA INHALE 1-2 PUFFS INTO THE LUNGS EVERY 6 HOURS AS NEEDED FOR WHEEZING OR SHORTNESS OF BREATH.   apixaban 5 MG Tabs tablet Commonly known as: ELIQUIS Take 1 tablet (5 mg total) by mouth 2 (two) times daily.   betamethasone dipropionate 0.05 % ointment Commonly known as: DIPROLENE Apply topically daily.   Breo  Ellipta 100-25 MCG/INH Aepb Generic drug: fluticasone furoate-vilanterol TAKE 1 PUFF BY MOUTH EVERY DAY   calcipotriene 0.005 % cream Commonly known as: DOVONOX APPLY TO SKIN ONCE A DAY APPLY DAILY ON HANDS, FEET AND WRIST X 2 WEEKS, THEN USE 5 DAYS A WEEK   Calcipotriene 0.005 % solution APPLY 1 A SMALL AMOUNT TO SCALP ONCE A DAY APPLY TO SCALP DAILY FOR 2 WEEKS, THEN USE ON SCALP 5 DAYS A WEEK   clobetasol 0.05 % external solution Commonly known as: TEMOVATE APPLY TO AFFECTED AREA TWICE A DAY   Cosentyx Sensoready (300 MG) 150 MG/ML Soaj Generic drug: Secukinumab (300 MG Dose) Inject 2 Syringes into the skin every 28 (twenty-eight) days.   donepezil 10 MG tablet Commonly known as: ARICEPT Take 10 mg by mouth daily.   ferrous sulfate 325 (65 FE) MG tablet Take 325 mg by mouth 2 (two) times daily.   folic acid 1 MG tablet Commonly known as: FOLVITE Take 1 mg by mouth daily.   furosemide 40 MG tablet Commonly known as: LASIX Take 1 tablet (40 mg total) by mouth daily. If weight 127 lb or less, take 20 mg by mouth daily.   gabapentin 300 MG capsule Commonly known as: NEURONTIN TAKE 1 CAPSULE BY MOUTH TWICE A DAY What changed: additional instructions   levocetirizine 5 MG tablet Commonly known as: XYZAL TAKE 1 TABLET BY MOUTH EVERY DAY IN THE EVENING What changed:   how much to take  how to take this   magic mouthwash w/lidocaine Soln Take 10 mLs by mouth 3 (three) times daily for 4 days.   methotrexate 2.5 MG tablet Commonly known as: RHEUMATREX Take 15 mg by mouth once a week.   metoprolol succinate 50 MG 24 hr tablet Commonly known as: TOPROL-XL Take 0.5 tablets (25 mg total) by mouth daily. TAKE WITH OR IMMEDIATELY FOLLOWING  A MEAL. What changed: how much to take   pantoprazole 20 MG tablet Commonly known as: PROTONIX TAKE 1 TABLET (20 MG TOTAL) BY MOUTH 2 (TWO) TIMES DAILY BEFORE A MEAL.   potassium chloride SA 20 MEQ tablet Commonly known as:  KLOR-CON Take 2 tablets (40 mEq total) by mouth daily.   rOPINIRole 1 MG tablet Commonly known as: REQUIP TAKE 1 TABLET (1 MG TOTAL) BY MOUTH AT BEDTIME.   simvastatin 20 MG tablet Commonly known as: ZOCOR TAKE 1 TABLET BY MOUTH EVERY DAY What changed: when to take this   traMADol 50 MG tablet Commonly known as: ULTRAM TAKE 1 TABLET BY MOUTH EVERY 6 HOURS AS NEEDED FOR MODERATE OR SEVERE PAIN What changed: See the new instructions.   Vitamin D3 50 MCG (2000 UT) Tabs Take 2,000 Units by mouth daily.         Time coordinating discharge: 31 minutes  Signed:  Lochlann Mastrangelo  [facility] 09/20/2020, 10:58 AM   Pager on [email or web address]. If 7PM-7AM, please contact night-coverage at [email or web address]

## 2020-09-20 NOTE — Evaluation (Signed)
Physical Therapy Evaluation Patient Details Name: Mariah [name] MRN: [id number] DOB: [birth year] Today's Date: 09/20/2020   History of Present Illness  Pt is a 75 y/o F admitted from home on 09/17/20 with c/c of passing out & hitting the back of her head. PMH: COPD, GERD, HTN, dyslipidemia, AAA, hx of DVT on eliquis, arthritis, CAP, legionella infection, R lumbar radiculitis, vitamin D deficiency  Clinical Impression  Pt seen for PT evaluation with niece present throughout. Pt is able to ambulate household distances with Hurry cane & close supervision on this date. Pt endorses furniture walking in the home with PT educating pt on need for AD to increase safety. Pt without c/o adverse symptoms during session & vital signs WNL. Will recommend HHPT f/u to maximize independence with mobility & reduce fall risk.  Sitting at end of session: BP 120/76 mmHg MAP 88 in LUE Standing at 0: BP 111/79 mmHg MAP 88 in LUE    Follow Up Recommendations Home health PT;Supervision for mobility/OOB    Equipment Recommendations  None recommended by PT    Recommendations for Other Services       Precautions / Restrictions Precautions Precautions: Fall Restrictions Weight Bearing Restrictions: No      Mobility  Bed Mobility Overal bed mobility: Modified Independent             General bed mobility comments: supine>sit with HOB elevated    Transfers Overall transfer level: Needs assistance   Transfers: Sit to/from Stand Sit to Stand: Supervision            Ambulation/Gait Ambulation/Gait assistance: Supervision Gait Distance (Feet): 100 Feet Assistive device:  (her personal hurry cane) Gait Pattern/deviations: Decreased stride length;Decreased step length - left;Decreased step length - right Gait velocity: decreased      Stairs            Wheelchair Mobility    Modified Rankin (Stroke Patients Only)       Balance Overall balance assessment: Needs  assistance Sitting-balance support: No upper extremity supported;Feet supported Sitting balance-Leahy Scale: Good     Standing balance support: Single extremity supported;During functional activity Standing balance-Leahy Scale: Good                               Pertinent Vitals/Pain Pain Assessment: Faces Faces Pain Scale: Hurts a little bit Pain Location: "my usual arthritic pain" - pt doesn't elaborate nor rate pain Pain Descriptors / Indicators: Aching Pain Intervention(s): Monitored during session    Home Living Family/patient expects to be discharged to:: Private residence Living Arrangements: Other relatives Available Help at Discharge: Family;Friend(s);Available 24 hours/day (lives with neice who can provide assistance except when she has her own medical appointments) Type of Home: House Home Access: Stairs to enter Entrance Stairs-Rails: Psychiatric nurse of Steps: 4-5 Home Layout: One level Home Equipment: Environmental consultant - 2 wheels;Cane - quad;Cane - single point;Bedside commode Additional Comments: Pt has been living with neice. Furniture walks in the home, uses hurry cane in community. 2 falls in past 6 months.    Prior Function Level of Independence: Independent with assistive device(s)         Comments: Furniture walks in the home, uses hurry cane in community.     Hand Dominance   Dominant Hand: Right    Extremity/Trunk Assessment   Upper Extremity Assessment Upper Extremity Assessment: Generalized weakness;Overall [facility] for tasks assessed    Lower Extremity Assessment Lower Extremity Assessment:  Overall WFL for tasks assessed;Generalized weakness       Communication   Communication: HOH  Cognition Arousal/Alertness: Awake/alert Behavior During Therapy: WFL for tasks assessed/performed Overall Cognitive Status: Within Functional Limits for tasks assessed (per chart pt with hx of dementia)                                         General Comments General comments (skin integrity, edema, etc.): SpO2 86-99% during session on room air, no c/o SOB when lowest reading of 86% during gait    Exercises     Assessment/Plan    PT Assessment Patient needs continued PT services  PT Problem List Decreased strength;Decreased balance;Decreased activity tolerance;Decreased mobility       PT Treatment Interventions DME instruction;Functional mobility training;Balance training;Patient/family education;Neuromuscular re-education;Therapeutic activities;Gait training;Stair training;Therapeutic exercise    PT Goals (Current goals can be found in the Care Plan section)  Acute Rehab PT Goals Patient Stated Goal: get better PT Goal Formulation: With patient Time For Goal Achievement: 10/04/20 Potential to Achieve Goals: Good    Frequency Min 2X/week   Barriers to discharge        Co-evaluation               AM-PAC PT "6 Clicks" Mobility  Outcome Measure Help needed turning from your back to your side while in a flat bed without using bedrails?: None Help needed moving from lying on your back to sitting on the side of a flat bed without using bedrails?: None Help needed moving to and from a bed to a chair (including a wheelchair)?: A Little Help needed standing up from a chair using your arms (e.g., wheelchair or bedside chair)?: A Little Help needed to walk in hospital room?: A Little Help needed climbing 3-5 steps with a railing? : A Little 6 Click Score: 20    End of Session Equipment Utilized During Treatment: Gait belt Activity Tolerance: Patient tolerated treatment well Patient left: in chair;with call bell/phone within reach;with chair alarm set;with family/visitor present   PT Visit Diagnosis: Muscle weakness (generalized) (M62.81);Unsteadiness on feet (R26.81)    Time: 5809-9833 PT Time Calculation (min) (ACUTE ONLY): 29 min   Charges:   PT Evaluation $PT Eval Low Complexity: 1  Low PT Treatments $Therapeutic Activity: 8-22 mins        Mariah [name], PT, DPT 09/20/20, 10:44 AM   Mariah [name] 09/20/2020, 10:43 AM

## 2020-09-20 NOTE — Plan of Care (Signed)

## 2020-09-20 NOTE — Telephone Encounter (Signed)
Dr. Jacinto Reap, I put Mariah [name] on 3/10 @ 1pm for a HFU. Please let us know if that works or if you have a better place to put her.

## 2020-09-20 NOTE — TOC Initial Note (Signed)
Transition of Care [facility]) - Initial/Assessment Note    Patient Details  Name: Mariah [name] MRN: [id number] Date of Birth: 01/16/1946  Transition of Care [facility]) CM/SW Contact:    Shelbie Ammons, RN Phone Number: 09/20/2020, 10:58 AM  Clinical Narrative:  RNCM called to room by patient's niece who had questions. Patient is up working with PT walking in hall. Niece has questions about having someone coming and assisting with sitting with patient when she the niece has appointments. Explained to niece how to look into personal care assistance. Towards end of visit PT is recommending home heath that patient is agreeable to and she requests [facility]. RNCM reached out to [place] with [facility] but she can't accept referral at this time. RNCM will reach out to [facility] with Alvis Lemmings to see if he can accept referral.               Expected Discharge Plan: [place] Barriers to Discharge: No Barriers Identified   Patient Goals and CMS Choice        Expected Discharge Plan and Services Expected Discharge Plan: [place] Choice: [place] arrangements for the past 2 months: Single Family Home Expected Discharge Date: 09/20/20                         HH Arranged: PT HH Agency: [facility] Date [place] Agency Contacted: 09/20/20 Time [place]: 1054 Representative spoke with at [place]: [place] Arrangements/Services Living arrangements for the past 2 months: [place] with:: Relatives Patient language and need for interpreter reviewed:: Yes Do you feel safe going back to the place where you live?: Yes      Need for Family Participation in Patient Care: Yes (Comment) Care giver support system in place?: Yes (comment)   Criminal Activity/Legal Involvement Pertinent to Current Situation/Hospitalization: No - Comment as needed  Activities of Daily Living Home Assistive  Devices/Equipment: (P) Contact lenses,Cane (specify quad or straight) ADL Screening (condition at time of admission) Patient's cognitive ability adequate to safely complete daily activities?: Yes Is the patient deaf or have difficulty hearing?: No Does the patient have difficulty seeing, even when wearing glasses/contacts?: No Does the patient have difficulty concentrating, remembering, or making decisions?: No Patient able to express need for assistance with ADLs?: Yes Does the patient have difficulty dressing or bathing?: No Independently performs ADLs?: Yes (appropriate for developmental age) Does the patient have difficulty walking or climbing stairs?: No Weakness of Legs: None Weakness of Arms/Hands: None  Permission Sought/Granted                  Emotional Assessment Appearance:: Appears stated age Attitude/Demeanor/Rapport: Engaged Affect (typically observed): Appropriate,Calm Orientation: : Oriented to Self,Oriented to Place,Oriented to  Time,Oriented to Situation Alcohol / Substance Use: Not Applicable Psych Involvement: No (comment)  Admission diagnosis:  Syncope and collapse [R55] Hypokalemia [E87.6] Syncope [R55] Thrombocytopenia ([place]) [D69.6] Patient Active Problem List   Diagnosis Date Noted  . Thrombocytopenia ([place]) 09/19/2020  . Hypokalemia 09/19/2020  . Orthostatic hypotension 09/18/2020  . Syncope 09/17/2020  . History of DVT (deep vein thrombosis) 07/09/2020  . 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 Hematoma of right hip 06/18/2020  . Congestive heart failure ([place]) 06/18/2020  . Weakness   . Closed nondisplaced fracture of distal phalanx of right little finger 06/06/2020  . Closed  nondisplaced fracture of distal phalanx of right thumb 06/06/2020  . Protein-calorie malnutrition, severe 06/05/2020  . Hypoalbuminemia 06/03/2020  . Cutaneous horn 06/03/2020  . Pleural effusion 06/03/2020  . Chronic diastolic CHF (congestive heart failure)  ([place]) 06/03/2020  . Acute respiratory failure with hypoxia ([place]) 06/02/2020  . Irregular heartbeat 05/30/2020  . Head injury 05/30/2020  . Bilateral edema of lower extremity 05/30/2020  . Closed fracture of right hand 05/30/2020  . Blood transfusion reaction 05/30/2020  . Leukopenia 05/26/2020  . CKD (chronic kidney disease), stage III ([place]) 05/26/2020  . Hematoma of right thigh 05/26/2020  . Finger fracture, right 05/26/2020  . Dyspnea 05/26/2020  . Acute deep vein thrombosis (DVT) of tibial vein of left lower extremity ([place]) 04/05/2020  . Mental confusion 04/01/2020  . Unintentional weight loss 03/01/2020  . Fall (on)(from) sidewalk curb, initial encounter 03/01/2020  . Memory loss 03/01/2020  . Rash of back 05/15/2019  . Allergic rhinitis 01/10/2018  . Cholelithiasis 03/16/2016  . Peripheral arterial disease ([place]) 02/05/2016  . 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 Abdominal aortic aneurysm (AAA) ([place]) 01/13/2016  . 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 Nerve root inflammation 10/25/2015  . Psoriatic arthritis ([place]) 10/07/2015  . 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 Right hip pain 08/12/2015  . Arthralgia of multiple joints 08/12/2015  . Age related osteoporosis 08/12/2015  . COPD with acute exacerbation ([place]) 06/24/2015  . Senile purpura ([place]) 06/24/2015  . Abnormal serum level of alkaline phosphatase 06/18/2015  . Elevated hemoglobin ([place]) 06/18/2015  . Lethargy 06/18/2015  . Fistula 06/18/2015  . Gastro-esophageal reflux disease without esophagitis 06/18/2015  . Restless leg 06/18/2015  . Avitaminosis D 06/18/2015  . Mechanical and motor problems with internal organs 10/11/2009  . Hypercholesteremia 10/11/2009  . Arthritis, degenerative 09/25/2009  . Essential (primary) hypertension 09/25/2009  . Psoriasis 09/25/2009  . Compulsive tobacco user syndrome 09/25/2009   PCP:  [name], MD Pharmacy:   [id number] [address], [place] [address] Phone: [phone] Fax: [phone]     Social Determinants of Health (SDOH) Interventions    Readmission Risk Interventions No flowsheet data found.

## 2020-09-23 ENCOUNTER — Telehealth: Payer: Self-pay

## 2020-09-23 NOTE — Telephone Encounter (Signed)
HFU scheduled for 09/26/20 @ 1:00 PM.

## 2020-09-23 NOTE — Telephone Encounter (Signed)
I have made the 1st attempt to contact the patient or family member in charge, in order to follow up from recently being discharged from the hospital. I left a message on voicemail requesting a CB. -MM 

## 2020-09-23 NOTE — Telephone Encounter (Signed)
Noted  

## 2020-09-24 ENCOUNTER — Telehealth: Payer: Self-pay

## 2020-09-24 NOTE — Telephone Encounter (Signed)
  Chronic Care Management   Outreach Note  09/24/2020 Name: Mariah [name] MRN: [id number] DOB: [birth year]  Primary Care Provider: [name], MD Reason for referral : Chronic Care Management   An unsuccessful telephone outreach was attempted today. [name] was referred to the case management team for assistance with care management and care coordination.     Follow Up Plan:  A HIPAA compliant voice message was left today requesting return call.    [name] Care Management [facility] [phone]

## 2020-09-24 NOTE — Telephone Encounter (Signed)
I have made the 2nd attempt to contact the patient or family member in charge, in order to follow up from recently being discharged from the hospital. I left a message on voicemail requesting a CB. -MM  

## 2020-09-25 NOTE — Telephone Encounter (Signed)
I have tried to contact this pt on two separate occassions and left a VM requesting a CB. Pt has not returned my calls or VM. HFU scheduled for 09/26/20 @ 1:00 PM. FYI to PCP. -MM

## 2020-09-26 ENCOUNTER — Inpatient Hospital Stay: Payer: Self-pay | Admitting: Family Medicine

## 2020-09-26 NOTE — Progress Notes (Deleted)
Established patient visit   Patient: Mariah [name]   DOB: March 18, 1946   75 y.o. Female  MRN: [id number] Visit Date: 09/26/2020  Today's healthcare provider: Lavon Paganini, MD   No chief complaint on file.  Subjective    HPI  Follow up Hospitalization  Patient was admitted to [facility] on 09/17/2020 and discharged on 09/20/2020. She was treated for syncope and collapse, hypokalemia, thrombocytopenia and orthostatic hypotension. Treatment for this included ***. Telephone follow up was done on *** She reports {excellent/good/fair:19992} compliance with treatment. She reports this condition is {resolved/improved/worsened:23923}.  ----------------------------------------------------------------------------------------- -   {Show patient history (optional):23778::" "}   Medications: Outpatient Medications Prior to Visit  Medication Sig  . albuterol (VENTOLIN HFA) 108 (90 Base) MCG/ACT inhaler INHALE 1-2 PUFFS INTO THE LUNGS EVERY 6 HOURS AS NEEDED FOR WHEEZING OR SHORTNESS OF BREATH.  [name] apixaban (ELIQUIS) 5 MG TABS tablet Take 1 tablet (5 mg total) by mouth 2 (two) times daily.  . betamethasone dipropionate (DIPROLENE) 0.05 % ointment Apply topically daily.  [name] BREO ELLIPTA 100-25 MCG/INH AEPB TAKE 1 PUFF BY MOUTH EVERY DAY  . calcipotriene (DOVONOX) 0.005 % cream APPLY TO SKIN ONCE A DAY APPLY DAILY ON HANDS, FEET AND WRIST X 2 WEEKS, THEN USE 5 DAYS A WEEK  . Calcipotriene 0.005 % solution APPLY 1 A SMALL AMOUNT TO SCALP ONCE A DAY APPLY TO SCALP DAILY FOR 2 WEEKS, THEN USE ON SCALP 5 DAYS A WEEK  . Cholecalciferol (VITAMIN D3) 2000 units TABS Take 2,000 Units by mouth daily.  . clobetasol (TEMOVATE) 0.05 % external solution APPLY TO AFFECTED AREA TWICE A DAY  . COSENTYX SENSOREADY, 300 MG, 150 MG/ML SOAJ Inject 2 Syringes into the skin every 28 (twenty-eight) days.  [name] donepezil (ARICEPT) 10 MG tablet Take 10 mg by mouth daily.  . ferrous sulfate 325 (65 FE) MG tablet Take 325  mg by mouth 2 (two) times daily.  . folic acid (FOLVITE) 1 MG tablet Take 1 mg by mouth daily.  . furosemide (LASIX) 40 MG tablet Take 1 tablet (40 mg total) by mouth daily. If weight 127 lb or less, take 20 mg by mouth daily.  [name] gabapentin (NEURONTIN) 300 MG capsule TAKE 1 CAPSULE BY MOUTH TWICE A DAY (Patient taking differently: Take 300 mg by mouth 2 (two) times daily. TAKE 1 CAPSULE BY MOUTH TWICE A DAY)  . levocetirizine (XYZAL) 5 MG tablet TAKE 1 TABLET BY MOUTH EVERY DAY IN THE EVENING (Patient taking differently: Take 5 mg by mouth every evening.)  . methotrexate (RHEUMATREX) 2.5 MG tablet Take 15 mg by mouth once a week.   . metoprolol succinate (TOPROL-XL) 50 MG 24 hr tablet Take 0.5 tablets (25 mg total) by mouth daily. TAKE WITH OR IMMEDIATELY FOLLOWING A MEAL.  . pantoprazole (PROTONIX) 20 MG tablet TAKE 1 TABLET (20 MG TOTAL) BY MOUTH 2 (TWO) TIMES DAILY BEFORE A MEAL.  [name] potassium chloride SA (KLOR-CON) 20 MEQ tablet Take 2 tablets (40 mEq total) by mouth daily.  [name] rOPINIRole (REQUIP) 1 MG tablet TAKE 1 TABLET (1 MG TOTAL) BY MOUTH AT BEDTIME.  . simvastatin (ZOCOR) 20 MG tablet TAKE 1 TABLET BY MOUTH EVERY DAY (Patient taking differently: Take 20 mg by mouth daily at 6 PM.)  . traMADol (ULTRAM) 50 MG tablet TAKE 1 TABLET BY MOUTH EVERY 6 HOURS AS NEEDED FOR MODERATE OR SEVERE PAIN (Patient taking differently: Take 50 mg by mouth 2 (two) times daily. TAKE 1 TABLET BY  MOUTH EVERY 6 HOURS AS NEEDED FOR MODERATE OR SEVERE PAIN)   No facility-administered medications prior to visit.    Review of Systems  {Labs  Heme  Chem  Endocrine  Serology  Results Review (optional):23779::" "}   Objective    There were no vitals taken for this visit. {Show previous vital signs (optional):23777::" "}   Physical Exam  ***  No results found for any visits on 09/26/20.  Assessment & Plan     ***  No follow-ups on file.      {provider attestation***:1}   Lavon Paganini, MD   [facility] [phone] (phone) [phone] (fax)  [place]

## 2020-09-27 ENCOUNTER — Telehealth: Payer: Self-pay | Admitting: Family Medicine

## 2020-09-27 NOTE — Telephone Encounter (Signed)
Pt niece Roman, Sandall is calling to schedule the pt a HFU appt. Next available appt is not until mid April. Would like to know could Dr. B see the pt in the afternoon bc it is hard to get the patient moving in the mornings. Please advise [phone]

## 2020-09-27 NOTE — Telephone Encounter (Signed)
Please advise 

## 2020-10-01 ENCOUNTER — Ambulatory Visit: Payer: Self-pay

## 2020-10-01 NOTE — Telephone Encounter (Signed)
My schedule for next 2 weeks is labeled with what appts can be changed to 20 min length. This should open some options for rescheduling. Thanks!

## 2020-10-01 NOTE — Chronic Care Management (AMB) (Signed)
°  Chronic Care Management   Outreach Note  10/01/2020 Name: Mariah [name] MRN: [id number] DOB: 06-Dec-1945  Primary Care Provider: [name], MD Reason for referral : Chronic Care Management    Mariah [name] was referred to the care management team for assistance with chronic care management and care coordination. Her primary care provider will be notified of our unsuccessful attempts to maintain contact. The care management team will gladly outreach at any time in the future if she is interested in receiving assistance.   Follow Up Plan:  The care management team will gladly follow up with Mariah [name] after the primary care provider has a conversation with her regarding recommendation for care management engagement and subsequent re-referral for care management services.    [name] [facility] Care Management [facility] [phone]

## 2020-10-08 ENCOUNTER — Other Ambulatory Visit: Payer: Self-pay | Admitting: Family Medicine

## 2020-10-08 MED ORDER — GABAPENTIN 300 MG PO CAPS: 300.0000 mg | ORAL_CAPSULE | Freq: Two times a day (BID) | ORAL | 0 refills | Status: DC

## 2020-10-08 NOTE — Telephone Encounter (Signed)
Requested medication (s) are due for refill today: no  Requested medication (s) are on the active medication list: yes  Last refill:  09/01/20 #180 0 refills   Future visit scheduled: yes in 2 days   Notes to clinic:  too soon for refill per pharmacy. Last filled on 08/04/20. Patient requesting today and reports today runs out of supply. Do you want to refill today?     Requested Prescriptions  Pending Prescriptions Disp Refills   gabapentin (NEURONTIN) 300 MG capsule 180 capsule 0    Sig: Take 1 capsule (300 mg total) by mouth 2 (two) times daily.      Neurology: Anticonvulsants - gabapentin Passed - 10/08/2020  1:37 PM      Passed - Valid encounter within last 12 months    Recent Outpatient Visits           1 month ago Essential (primary) hypertension   [facility] [place], Dionne Bucy, MD   2 months ago Acute respiratory failure with hypoxia [facility])   [facility], Dionne Bucy, MD   3 months ago Chronic obstructive pulmonary disease, unspecified COPD type [facility])   [place], Kelby Aline, FNP   3 months ago Chronic obstructive pulmonary disease, unspecified COPD type [facility])   [place], Kelby Aline, FNP   4 months ago [place] Fl[place], Kelby Aline, FNP       Future Appointments             In 2 days Bacigalupo, Dionne Bucy, MD [facility], [place]   In 1 month Bacigalupo, Dionne Bucy, MD [facility], [place]

## 2020-10-08 NOTE — Telephone Encounter (Signed)
Medication Refill - Medication: gabapentin (NEURONTIN) 300 MG capsule  Pt runs out of supply today  Has the patient contacted their pharmacy? Yes.   (Agent: If no, request that the patient contact the pharmacy for the refill.) (Agent: If yes, when and what did the pharmacy advise?)  Preferred Pharmacy (with phone number or street name):  [id number] [address], [place]  [place] [address]  Phone: [phone] Fax: [phone]     Agent: Please be advised that RX refills may take up to 3 business days. We ask that you follow-up with your pharmacy.

## 2020-10-10 ENCOUNTER — Other Ambulatory Visit: Payer: Self-pay

## 2020-10-10 ENCOUNTER — Ambulatory Visit (INDEPENDENT_AMBULATORY_CARE_PROVIDER_SITE_OTHER): Payer: PPO | Admitting: Family Medicine

## 2020-10-10 ENCOUNTER — Encounter: Payer: Self-pay | Admitting: Family Medicine

## 2020-10-10 VITALS — BP 112/67 | HR 69 | Temp 98.0°F | Resp 16 | Wt 126.0 lb

## 2020-10-10 DIAGNOSIS — E876 Hypokalemia: Secondary | ICD-10-CM

## 2020-10-10 DIAGNOSIS — R55 Syncope and collapse: Secondary | ICD-10-CM

## 2020-10-10 DIAGNOSIS — I951 Orthostatic hypotension: Secondary | ICD-10-CM | POA: Diagnosis not present

## 2020-10-10 DIAGNOSIS — D61818 Other pancytopenia: Secondary | ICD-10-CM

## 2020-10-10 DIAGNOSIS — L4050 Arthropathic psoriasis, unspecified: Secondary | ICD-10-CM

## 2020-10-10 NOTE — Progress Notes (Addendum)
Established patient visit   Patient: Mariah [name]   DOB: [birth year]   75 y.o. Female  MRN: [id number] Visit Date: 10/10/2020  Today's healthcare provider: Lavon Paganini, MD   Chief Complaint  Patient presents with  . Hospitalization Follow-up   Subjective    HPI  Follow up Hospitalization  Patient was admitted to [facility] on 09/17/2020 and discharged on 09/20/2020. She was treated for syncope. Echocardiogram reassuring-reviewed this and other results Telephone follow up was done on 09/20/2020. She reports good compliance with treatment. She reports this condition is resolved.  Stopped lasix, Metop in half - stil doing that.  Thought that syncope was related to hypotension Not checking blood pressure at home  Also noted to be pancytopenic.  Patient is already seeing heme-onc.  She is still taking her methotrexate and has not talked to rheumatology about stopping this yet  Compared discharge med list with home med list. Reconciled.    Social History   Tobacco Use  . Smoking status: Current Every Day Smoker    Packs/day: 1.50    Years: 40.00    Pack years: 60.00  . Smokeless tobacco: Never Used  . Tobacco comment: 0.5 PPD 06/19/2020  Vaping Use  . Vaping Use: Some days  Substance Use Topics  . Alcohol use: Yes    Comment: occasionally glass of wine  . Drug use: No       Medications: Outpatient Medications Prior to Visit  Medication Sig  . albuterol (VENTOLIN HFA) 108 (90 Base) MCG/ACT inhaler INHALE 1-2 PUFFS INTO THE LUNGS EVERY 6 HOURS AS NEEDED FOR WHEEZING OR SHORTNESS OF BREATH.  [name] apixaban (ELIQUIS) 5 MG TABS tablet Take 1 tablet (5 mg total) by mouth 2 (two) times daily.  . betamethasone dipropionate (DIPROLENE) 0.05 % ointment Apply topically daily.  [name] BREO ELLIPTA 100-25 MCG/INH AEPB TAKE 1 PUFF BY MOUTH EVERY DAY  . calcipotriene (DOVONOX) 0.005 % cream APPLY TO SKIN ONCE A DAY APPLY DAILY ON HANDS, FEET AND WRIST X 2 WEEKS, THEN USE 5 DAYS A WEEK  .  Calcipotriene 0.005 % solution APPLY 1 A SMALL AMOUNT TO SCALP ONCE A DAY APPLY TO SCALP DAILY FOR 2 WEEKS, THEN USE ON SCALP 5 DAYS A WEEK  . Cholecalciferol (VITAMIN D3) 2000 units TABS Take 2,000 Units by mouth daily.  . clobetasol (TEMOVATE) 0.05 % external solution APPLY TO AFFECTED AREA TWICE A DAY  . COSENTYX SENSOREADY, 300 MG, 150 MG/ML SOAJ Inject 2 Syringes into the skin every 28 (twenty-eight) days.  [name] donepezil (ARICEPT) 10 MG tablet Take 10 mg by mouth daily.  . ferrous sulfate 325 (65 FE) MG tablet Take 325 mg by mouth 2 (two) times daily.  . folic acid (FOLVITE) 1 MG tablet Take 1 mg by mouth daily.  . furosemide (LASIX) 40 MG tablet Take 1 tablet (40 mg total) by mouth daily. If weight 127 lb or less, take 20 mg by mouth daily.  [name] gabapentin (NEURONTIN) 300 MG capsule Take 1 capsule (300 mg total) by mouth 2 (two) times daily.  [name] levocetirizine (XYZAL) 5 MG tablet TAKE 1 TABLET BY MOUTH EVERY DAY IN THE EVENING (Patient taking differently: Take 5 mg by mouth every evening.)  . methotrexate (RHEUMATREX) 2.5 MG tablet Take 15 mg by mouth once a week.   . metoprolol succinate (TOPROL-XL) 50 MG 24 hr tablet Take 0.5 tablets (25 mg total) by mouth daily. TAKE WITH OR IMMEDIATELY FOLLOWING A MEAL.  . pantoprazole (  PROTONIX) 20 MG tablet TAKE 1 TABLET (20 MG TOTAL) BY MOUTH 2 (TWO) TIMES DAILY BEFORE A MEAL.  [name] potassium chloride SA (KLOR-CON) 20 MEQ tablet Take 2 tablets (40 mEq total) by mouth daily.  [name] rOPINIRole (REQUIP) 1 MG tablet TAKE 1 TABLET (1 MG TOTAL) BY MOUTH AT BEDTIME.  . simvastatin (ZOCOR) 20 MG tablet TAKE 1 TABLET BY MOUTH EVERY DAY (Patient taking differently: Take 20 mg by mouth daily at 6 PM.)  . traMADol (ULTRAM) 50 MG tablet TAKE 1 TABLET BY MOUTH EVERY 6 HOURS AS NEEDED FOR MODERATE OR SEVERE PAIN (Patient taking differently: Take 50 mg by mouth 2 (two) times daily. TAKE 1 TABLET BY MOUTH EVERY 6 HOURS AS NEEDED FOR MODERATE OR SEVERE PAIN)   No  facility-administered medications prior to visit.    Review of Systems  Constitutional: Negative for activity change and fatigue.  Respiratory: Negative for cough and shortness of breath.   Cardiovascular: Negative for chest pain, palpitations and leg swelling.  Neurological: Negative for dizziness, weakness, light-headedness and headaches.  Psychiatric/Behavioral: Negative for confusion, decreased concentration, self-injury, sleep disturbance and suicidal ideas.       Objective    BP 112/67   Pulse 69   Temp 98 F (36.7 C)   Resp 16   Wt 126 lb (57.2 kg)   BMI 23.81 kg/m     Physical Exam Vitals reviewed.  Constitutional:      General: She is not in acute distress.    Appearance: Normal appearance. She is well-developed. She is not diaphoretic.  HENT:     Head: Normocephalic and atraumatic.  Eyes:     General: No scleral icterus.    Conjunctiva/sclera: Conjunctivae normal.  Neck:     Thyroid: No thyromegaly.  Cardiovascular:     Rate and Rhythm: Normal rate and regular rhythm.     Pulses: Normal pulses.  Pulmonary:     Effort: Pulmonary effort is normal. No respiratory distress.     Breath sounds: Normal breath sounds. No wheezing, rhonchi or rales.  Musculoskeletal:     Cervical back: Neck supple.     Right lower leg: No edema.     Left lower leg: No edema.  Lymphadenopathy:     Cervical: No cervical adenopathy.  Skin:    General: Skin is warm and dry.     Findings: No rash.  Neurological:     Mental Status: She is alert. Mental status is at baseline.  Psychiatric:        Mood and Affect: Mood normal.        Behavior: Behavior normal.       Results for orders placed or performed in visit on 10/10/20  CBC w/Diff/Platelet  Result Value Ref Range   WBC 7.8 3.4 - 10.8 x10E3/uL   RBC 3.78 3.77 - 5.28 x10E6/uL   Hemoglobin 12.8 11.1 - 15.9 g/dL   Hematocrit 36.5 34.0 - 46.6 %   MCV 97 79 - 97 fL   MCH 33.9 (H) 26.6 - 33.0 pg   MCHC 35.1 31.5 - 35.7  g/dL   RDW 13.8 11.7 - 15.4 %   Platelets 179 150 - 450 x10E3/uL   Neutrophils 75 Not Estab. %   Lymphs 15 Not Estab. %   Monocytes 8 Not Estab. %   Eos 2 Not Estab. %   Basos 0 Not Estab. %   Neutrophils Absolute 5.8 1.4 - 7.0 x10E3/uL   Lymphocytes Absolute 1.2 0.7 - 3.1 x10E3/uL  Monocytes Absolute 0.6 0.1 - 0.9 x10E3/uL   EOS (ABSOLUTE) 0.2 0.0 - 0.4 x10E3/uL   Basophils Absolute 0.0 0.0 - 0.2 x10E3/uL   Immature Granulocytes 0 Not Estab. %   Immature Grans (Abs) 0.0 0.0 - 0.1 [id number]  Basic Metabolic Panel (BMET)  Result Value Ref Range   Glucose 91 65 - 99 mg/dL   BUN 11 8 - 27 mg/dL   Creatinine, Ser 0.93 0.57 - 1.00 mg/dL   eGFR 64 >59 mL/min/1.73   BUN/Creatinine Ratio 12 12 - 28   Sodium 139 134 - 144 mmol/L   Potassium 3.6 3.5 - 5.2 mmol/L   Chloride 97 96 - 106 mmol/L   CO2 26 20 - 29 mmol/L   Calcium 9.1 8.7 - 10.3 mg/dL    Assessment & Plan     Problem List Items Addressed This Visit      Cardiovascular and Mediastinum   Orthostatic hypotension    Seems to be resolved Blood pressure is good today No further symptoms or syncope Continue to hold Lasix and use half dose metoprolol      Relevant Orders   Ambulatory referral to [employer] Referral to [facility] Care Coordinaton     Musculoskeletal and Integument   Psoriatic arthritis ([place])    Followed by rheumatology Continue Cosentyx and methotrexate Discussed with patient that pancytopenia could be related to methotrexate and she should discuss this with rheumatology      Relevant Orders   AMB Referral to [facility] Care Coordinaton     Hematopoietic and Hemostatic   Pancytopenia ([place])    Discussed her pancytopenia that was noted during hospitalization Do wonder if she had some volume dilution from her IV fluids Could also be related to methotrexate use, which I advised her to discuss with her rheumatologist Continue to hold Eliquis and make follow-up appoint with cardiology Upcoming  follow-up with hematology Recheck CBC      Relevant Orders   CBC w/Diff/Platelet (Completed)   AMB Referral to [place]     Other   Syncope - Primary    No further episodes of syncope Related to orthostatic hypotension Echo reassuring Return precautions discussed      Relevant Orders   Ambulatory referral to [place] Referral to [facility] Care Coordinaton   Hypokalemia    Continue potassium supplement Recheck labs to ensure that it has resolved      Relevant Orders   Basic Metabolic Panel (BMET) (Completed)   AMB Referral to [place]       Return for as scheduled.      I, Lavon Paganini, MD, have reviewed all documentation for this visit. The documentation on 10/11/20 for the exam, diagnosis, procedures, and orders are all accurate and complete.   Bacigalupo, Dionne Bucy, MD, MPH [facility]

## 2020-10-10 NOTE — Patient Instructions (Signed)
Call Dr Donivan Scull office for a follow-up appointment  [phone]

## 2020-10-11 ENCOUNTER — Encounter: Payer: Self-pay | Admitting: Family Medicine

## 2020-10-11 ENCOUNTER — Telehealth: Payer: Self-pay | Admitting: *Deleted

## 2020-10-11 LAB — CBC WITH DIFFERENTIAL/PLATELET
Basophils Absolute: 0 10*3/uL (ref 0.0–0.2)
Basos: 0 %
EOS (ABSOLUTE): 0.2 10*3/uL (ref 0.0–0.4)
Eos: 2 %
Hematocrit: 36.5 % (ref 34.0–46.6)
Hemoglobin: 12.8 g/dL (ref 11.1–15.9)
Immature Grans (Abs): 0 10*3/uL (ref 0.0–0.1)
Immature Granulocytes: 0 %
Lymphocytes Absolute: 1.2 10*3/uL (ref 0.7–3.1)
Lymphs: 15 %
MCH: 33.9 pg — ABNORMAL HIGH (ref 26.6–33.0)
MCHC: 35.1 g/dL (ref 31.5–35.7)
MCV: 97 fL (ref 79–97)
Monocytes Absolute: 0.6 10*3/uL (ref 0.1–0.9)
Monocytes: 8 %
Neutrophils Absolute: 5.8 10*3/uL (ref 1.4–7.0)
Neutrophils: 75 %
Platelets: 179 10*3/uL (ref 150–450)
RBC: 3.78 x10E6/uL (ref 3.77–5.28)
RDW: 13.8 % (ref 11.7–15.4)
WBC: 7.8 10*3/uL (ref 3.4–10.8)

## 2020-10-11 LAB — BASIC METABOLIC PANEL
BUN/Creatinine Ratio: 12 (ref 12–28)
BUN: 11 mg/dL (ref 8–27)
CO2: 26 mmol/L (ref 20–29)
Calcium: 9.1 mg/dL (ref 8.7–10.3)
Chloride: 97 mmol/L (ref 96–106)
Creatinine, Ser: 0.93 mg/dL (ref 0.57–1.00)
Glucose: 91 mg/dL (ref 65–99)
Potassium: 3.6 mmol/L (ref 3.5–5.2)
Sodium: 139 mmol/L (ref 134–144)
eGFR: 64 mL/min/{1.73_m2} (ref >59)

## 2020-10-11 NOTE — Assessment & Plan Note (Signed)
Discussed her pancytopenia that was noted during hospitalization Do wonder if she had some volume dilution from her IV fluids Could also be related to methotrexate use, which I advised her to discuss with her rheumatologist Continue to hold Eliquis and make follow-up appoint with cardiology Upcoming follow-up with hematology Recheck CBC

## 2020-10-11 NOTE — Assessment & Plan Note (Signed)
Continue potassium supplement Recheck labs to ensure that it has resolved

## 2020-10-11 NOTE — Assessment & Plan Note (Signed)
Followed by rheumatology Continue Cosentyx and methotrexate Discussed with patient that pancytopenia could be related to methotrexate and she should discuss this with rheumatology

## 2020-10-11 NOTE — Assessment & Plan Note (Signed)
Seems to be resolved Blood pressure is good today No further symptoms or syncope Continue to hold Lasix and use half dose metoprolol

## 2020-10-11 NOTE — Assessment & Plan Note (Signed)
No further episodes of syncope Related to orthostatic hypotension Echo reassuring Return precautions discussed

## 2020-10-11 NOTE — Chronic Care Management (AMB) (Signed)
  Chronic Care Management   Outreach Note  10/11/2020 Name: Mariah [name] MRN: [id number] DOB: 06/14/1946  Mariah [name] is a 75 y.o. year old female who is a primary care patient of Brita Romp, Dionne Bucy, MD. I reached out to Jackolyn Confer by phone today in response to a referral sent by [name]'s PCP, [name], MD.     An unsuccessful telephone outreach was attempted today. The patient was referred to the case management team for assistance with care management and care coordination.   Follow Up Plan: The care management team will reach out to the patient again over the next 7 days.  If patient returns call to provider office, please advise to call [place] at [phone].  [place] Management

## 2020-10-15 ENCOUNTER — Telehealth: Payer: Self-pay

## 2020-10-15 NOTE — Telephone Encounter (Signed)
-----   Message from [name], MD sent at 10/11/2020  8:17 AM EDT ----- Labs are back to normal. No change to medications.

## 2020-10-15 NOTE — Telephone Encounter (Signed)
Patient was advised and verbalized understanding. 

## 2020-10-17 ENCOUNTER — Telehealth: Payer: Self-pay

## 2020-10-17 NOTE — Chronic Care Management (AMB) (Signed)
  Chronic Care Management   Note  10/17/2020 Name: TILLEY FAETH MRN: [id number] DOB: [birth year]  [name] is a 75 y.o. year old female who is a primary care patient of Brita Romp, Dionne Bucy, MD. I reached out to Jackolyn Confer by phone today in response to a referral sent by Ms. Audry Riles Coupland's PCP, Brita Romp Dionne Bucy, MD.     Ms. Uyeno was given information about Chronic Care Management services today including:  1. CCM service includes personalized support from designated clinical staff supervised by her physician, including individualized plan of care and coordination with other care providers 2. 24/7 contact phone numbers for assistance for urgent and routine care needs. 3. Service will only be billed when office clinical staff spend 20 minutes or more in a month to coordinate care. 4. Only one practitioner may furnish and bill the service in a calendar month. 5. The patient may stop CCM services at any time (effective at the end of the month) by phone call to the office staff. 6. The patient will be responsible for cost sharing (co-pay) of up to 20% of the service fee (after annual deductible is met).  Patient agreed to services and verbal consent obtained.   Follow up plan: Telephone appointment with care management team member scheduled NID:[id number]/23/5359, Licensed Clinical SW 10/31/2020, and PharmD  [id number]/25/[id number]  [place]

## 2020-10-17 NOTE — Telephone Encounter (Signed)
Copied from [place] [phone]. Topic: Appointment Scheduling - Scheduling Inquiry for Clinic >> Oct 16, 2020  4:39 PM Yvette Rack wrote: Reason for CRM: Pt would like to be contacted to schedule an appt for Covid vaccine.

## 2020-10-21 NOTE — Telephone Encounter (Signed)
Called to schedule covid vaccine no answer and VM was full. Also, Medicaid program forms have been completed. Thanks TNP

## 2020-10-23 ENCOUNTER — Other Ambulatory Visit: Payer: Self-pay

## 2020-10-23 ENCOUNTER — Ambulatory Visit (INDEPENDENT_AMBULATORY_CARE_PROVIDER_SITE_OTHER): Payer: PPO

## 2020-10-23 DIAGNOSIS — Z23 Encounter for immunization: Secondary | ICD-10-CM | POA: Diagnosis not present

## 2020-10-24 ENCOUNTER — Ambulatory Visit: Payer: PPO | Admitting: Dermatology

## 2020-10-25 ENCOUNTER — Telehealth: Payer: Self-pay

## 2020-10-25 ENCOUNTER — Telehealth: Payer: PPO

## 2020-10-25 NOTE — Telephone Encounter (Signed)
  Chronic Care Management   Outreach Note  10/25/2020 Name: Mariah [name] MRN: [id number] DOB: 1946-06-09  Primary Care Provider: [name], MD Reason for referral : Chronic Care Management   An unsuccessful telephone outreach was attempted today. [name] was referred to the case management team for assistance with care management and care coordination.    Follow Up Plan:  Unable to leave a voice message due to voice mailbox being full. Will notify the Care Guide team to assist with rescheduling.    [name] [phone]

## 2020-10-30 ENCOUNTER — Ambulatory Visit: Payer: PPO

## 2020-10-31 ENCOUNTER — Telehealth: Payer: Self-pay | Admitting: *Deleted

## 2020-10-31 ENCOUNTER — Ambulatory Visit (INDEPENDENT_AMBULATORY_CARE_PROVIDER_SITE_OTHER): Payer: PPO | Admitting: *Deleted

## 2020-10-31 DIAGNOSIS — I10 Essential (primary) hypertension: Secondary | ICD-10-CM

## 2020-10-31 DIAGNOSIS — R413 Other amnesia: Secondary | ICD-10-CM

## 2020-10-31 NOTE — Telephone Encounter (Signed)
  Chronic Care Management   Outreach Note  10/31/2020 Name: Mariah [name] MRN: [id number] DOB: [birth year]  Referred by: [name], MD Reason for referral : Chronic Care Management   An unsuccessful telephone outreach was attempted today. The patient was referred to the case management team for assistance with care management and care coordination. -mailbox was full, could not leave a message for patient or her niece.  Follow Up Plan: care guide to reach out to patient and her niece to reschedule initial appointment   Elliot Gurney, [place] Worker  [facility] [phone]

## 2020-10-31 NOTE — Telephone Encounter (Signed)
This encounter was created in error - please disregard.

## 2020-10-31 NOTE — Addendum Note (Signed)
Addended by: Vern Claude on: 10/31/2020 02:44 PM   Modules accepted: Level of Service, SmartSet

## 2020-11-01 ENCOUNTER — Ambulatory Visit: Payer: PPO | Admitting: Internal Medicine

## 2020-11-01 ENCOUNTER — Other Ambulatory Visit: Payer: PPO

## 2020-11-01 NOTE — Chronic Care Management (AMB) (Signed)
Chronic Care Management    Clinical Social Work Note  11/01/2020 Name: Mariah [name] MRN: [id number] DOB: [birth year]  Mariah [name] is a 75 y.o. year old female who is a primary care patient of Bacigalupo, Dionne Bucy, MD. The CCM team was consulted to assist the patient with chronic disease management and/or care coordination needs related to: [employer]  and [employer].   Engaged with patient by telephone for initial visit in response to provider referral for social work chronic care management and care coordination services.   Consent to Services:  The patient was given the following information about Chronic Care Management services today, agreed to services, and gave verbal consent: 1. CCM service includes personalized support from designated clinical staff supervised by the primary care provider, including individualized plan of care and coordination with other care providers 2. 24/7 contact phone numbers for assistance for urgent and routine care needs. 3. Service will only be billed when office clinical staff spend 20 minutes or more in a month to coordinate care. 4. Only one practitioner may furnish and bill the service in a calendar month. 5.The patient may stop CCM services at any time (effective at the end of the month) by phone call to the office staff. 6. The patient will be responsible for cost sharing (co-pay) of up to 20% of the service fee (after annual deductible is met). Patient agreed to services and consent obtained.  Patient agreed to services and consent obtained.   Assessment: Review of patient past medical history, allergies, medications, and health status, including review of relevant consultants reports was performed today as part of a comprehensive evaluation and provision of chronic care management and care coordination services.     SDOH (Social Determinants of Health) assessments and interventions performed:    Advanced Directives  Status: Not addressed in this encounter.  CCM Care Plan  Allergies  Allergen Reactions  . Amoxicillin Diarrhea  . Clindamycin/Lincomycin     diarrhea    Outpatient Encounter Medications as of 10/31/2020  Medication Sig Note  . albuterol (VENTOLIN HFA) 108 (90 Base) MCG/ACT inhaler INHALE 1-2 PUFFS INTO THE LUNGS EVERY 6 HOURS AS NEEDED FOR WHEEZING OR SHORTNESS OF BREATH.   [name] apixaban (ELIQUIS) 5 MG TABS tablet Take 1 tablet (5 mg total) by mouth 2 (two) times daily.   . betamethasone dipropionate (DIPROLENE) 0.05 % ointment Apply topically daily.   [name] BREO ELLIPTA 100-25 MCG/INH AEPB TAKE 1 PUFF BY MOUTH EVERY DAY   . calcipotriene (DOVONOX) 0.005 % cream APPLY TO SKIN ONCE A DAY APPLY DAILY ON HANDS, FEET AND WRIST X 2 WEEKS, THEN USE 5 DAYS A WEEK   . Calcipotriene 0.005 % solution APPLY 1 A SMALL AMOUNT TO SCALP ONCE A DAY APPLY TO SCALP DAILY FOR 2 WEEKS, THEN USE ON SCALP 5 DAYS A WEEK   . Cholecalciferol (VITAMIN D3) 2000 units TABS Take 2,000 Units by mouth daily.   . clobetasol (TEMOVATE) 0.05 % external solution APPLY TO AFFECTED AREA TWICE A DAY   . COSENTYX SENSOREADY, 300 MG, 150 MG/ML SOAJ Inject 2 Syringes into the skin every 28 (twenty-eight) days.   [name] donepezil (ARICEPT) 10 MG tablet Take 10 mg by mouth daily.   . ferrous sulfate 325 (65 FE) MG tablet Take 325 mg by mouth 2 (two) times daily.   . folic acid (FOLVITE) 1 MG tablet Take 1 mg by mouth daily.   . furosemide (LASIX) 40 MG tablet Take  1 tablet (40 mg total) by mouth daily. If weight 127 lb or less, take 20 mg by mouth daily.   [name] gabapentin (NEURONTIN) 300 MG capsule Take 1 capsule (300 mg total) by mouth 2 (two) times daily.   [name] levocetirizine (XYZAL) 5 MG tablet TAKE 1 TABLET BY MOUTH EVERY DAY IN THE EVENING (Patient taking differently: Take 5 mg by mouth every evening.)   . methotrexate (RHEUMATREX) 2.5 MG tablet Take 15 mg by mouth once a week.  09/17/2020: Monday   . metoprolol succinate (TOPROL-XL) 50 MG 24  hr tablet Take 0.5 tablets (25 mg total) by mouth daily. TAKE WITH OR IMMEDIATELY FOLLOWING A MEAL.   . pantoprazole (PROTONIX) 20 MG tablet TAKE 1 TABLET (20 MG TOTAL) BY MOUTH 2 (TWO) TIMES DAILY BEFORE A MEAL.   [name] potassium chloride SA (KLOR-CON) 20 MEQ tablet Take 2 tablets (40 mEq total) by mouth daily.   [name] rOPINIRole (REQUIP) 1 MG tablet TAKE 1 TABLET (1 MG TOTAL) BY MOUTH AT BEDTIME.   . simvastatin (ZOCOR) 20 MG tablet TAKE 1 TABLET BY MOUTH EVERY DAY (Patient taking differently: Take 20 mg by mouth daily at 6 PM.)   . traMADol (ULTRAM) 50 MG tablet TAKE 1 TABLET BY MOUTH EVERY 6 HOURS AS NEEDED FOR MODERATE OR SEVERE PAIN (Patient taking differently: Take 50 mg by mouth 2 (two) times daily. TAKE 1 TABLET BY MOUTH EVERY 6 HOURS AS NEEDED FOR MODERATE OR SEVERE PAIN)    No facility-administered encounter medications on file as of 10/31/2020.    Patient Active Problem List   Diagnosis Date Noted  . Thrombocytopenia ([place]) 09/19/2020  . Hypokalemia 09/19/2020  . Orthostatic hypotension 09/18/2020  . Syncope 09/17/2020  . History of DVT (deep vein thrombosis) 07/09/2020  . 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 Hematoma of right hip 06/18/2020  . Congestive heart failure ([place]) 06/18/2020  . Weakness   . Closed nondisplaced fracture of distal phalanx of right little finger 06/06/2020  . Closed nondisplaced fracture of distal phalanx of right thumb 06/06/2020  . Protein-calorie malnutrition, severe 06/05/2020  . Hypoalbuminemia 06/03/2020  . Cutaneous horn 06/03/2020  . Pleural effusion 06/03/2020  . Chronic diastolic CHF (congestive heart failure) ([place]) 06/03/2020  . Acute respiratory failure with hypoxia ([place]) 06/02/2020  . Irregular heartbeat 05/30/2020  . Head injury 05/30/2020  . Bilateral edema of lower extremity 05/30/2020  . Closed fracture of right hand 05/30/2020  . Blood transfusion reaction 05/30/2020  . Pancytopenia ([place]) 05/26/2020  . CKD (chronic kidney  disease), stage III ([place]) 05/26/2020  . Hematoma of right thigh 05/26/2020  . Finger fracture, right 05/26/2020  . Dyspnea 05/26/2020  . Acute deep vein thrombosis (DVT) of tibial vein of left lower extremity ([place]) 04/05/2020  . Mental confusion 04/01/2020  . Unintentional weight loss 03/01/2020  . Fall (on)(from) sidewalk curb, initial encounter 03/01/2020  . Memory loss 03/01/2020  . Rash of back 05/15/2019  . Allergic rhinitis 01/10/2018  . Cholelithiasis 03/16/2016  . Peripheral arterial disease ([place]) 02/05/2016  . 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 Abdominal aortic aneurysm (AAA) ([place]) 01/13/2016  . 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 Nerve root inflammation 10/25/2015  . Psoriatic arthritis ([place]) 10/07/2015  . 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 Right hip pain 08/12/2015  . Arthralgia of multiple joints 08/12/2015  . Age related osteoporosis 08/12/2015  . COPD with acute exacerbation ([place]) 06/24/2015  . Senile purpura ([place]) 06/24/2015  . Abnormal serum level of alkaline phosphatase  06/18/2015  . Elevated hemoglobin ([place]) 06/18/2015  . Lethargy 06/18/2015  . Fistula 06/18/2015  . Gastro-esophageal reflux disease without esophagitis 06/18/2015  . Restless leg 06/18/2015  . Avitaminosis D 06/18/2015  . Mechanical and motor problems with internal organs 10/11/2009  . Hypercholesteremia 10/11/2009  . Arthritis, degenerative 09/25/2009  . Essential (primary) hypertension 09/25/2009  . Psoriasis 09/25/2009  . 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 Chronic Mental Health needs related to adjustment to  memory challenges and loss of independence . Memory Deficits . Suicidal Ideation/Homicidal Ideation: No  Clinical Social Work Goal(s):  . patient will work with SW bi-weekly by telephone or in person  . patient will work with SW to address concerns related to loss if independence  Interventions: . Patient interviewed and appropriate assessments performed: PHQ 2 . PHQ 9 . 1:1 collaboration with Brita Romp Dionne Bucy, MD regarding development and update of comprehensive plan of care as evidenced by provider attestation and co-signature . Patient denies mental confusion, depression or anxiety however verbalized difficulties coping with the loss of her independence after moving in with her niece . Patient able to verbalize having a good supportive network, however would eventually like to live on her own again . Patient confirmed that her niece transports her to all appointments and her brother manages her finances . Patient provided with emotional support and was allowed to vent her frustrations. . Encouraged patient  to express her feeling and concerns as she adjusts to her new norm . Benefits of residing with family discussed-patient's safety emphasized throughout session . Patient remained on topic throughout session and was able to verbalize safety plan in the event of an emergency . Collaboration with patient's niece attempted, however her voicemail was full and could not leave a message  Patient Self Care Activities:  . Performs ADL's independently . Strong family or social support  Patient Coping Strengths:  . Supportive Relationships . Family . Friends . Able to Communicate Effectively  Patient Self Care Deficits:  . Unable to perform IADLs independently  Patient Goals:  - talk about feelings with a friend, family or spiritual advisor - practice positive thinking and self-talk - keep a calendar with appointment dates      Follow Up Plan: SW will follow up with patient by  phone over the next 14 business days       [place], [place] Worker  [facility] Management [phone]

## 2020-11-01 NOTE — Patient Instructions (Signed)
Visit Information  Goals Addressed            This Visit's Progress   . Manage My Emotions       Timeframe:  Long-Range Goal Priority:  Medium Start Date:   10/31/20                          Expected End Date:   05/02/21                   Follow Up Date: 11/14/20   - talk about feelings with a friend, family or spiritual advisor - practice positive thinking and self-talk    Why is this important?    When you are stressed, down or upset, your body reacts too.   For example, your blood pressure may get higher; you may have a headache or stomachache.   When your emotions get the best of you, your body's ability to fight off cold and flu gets weak.   These steps will help you manage your emotions.     Notes:        The patient verbalized understanding of instructions, educational materials, and care plan provided today and declined offer to receive copy of patient instructions, educational materials, and care plan.   Telephone follow up appointment with care management team member scheduled for:11/14/20  Elliot Gurney, [place] Worker  [facility] [phone]

## 2020-11-04 ENCOUNTER — Telehealth: Payer: Self-pay | Admitting: *Deleted

## 2020-11-04 ENCOUNTER — Telehealth: Payer: Self-pay

## 2020-11-04 NOTE — Chronic Care Management (AMB) (Signed)
  Care Management   Note  11/04/2020 Name: Mariah [name] MRN: [id number] DOB: 1946-03-18  Mariah [name] is a 75 y.o. year old female who is a primary care patient of Brita Romp, Dionne Bucy, MD and is actively engaged with the care management team. I reached out to Jackolyn Confer by phone today to assist with re-scheduling an initial visit with the RN Case Manager  Follow up plan: Telephone appointment with care management team member scheduled for:  11/26/2020  [place] Management

## 2020-11-04 NOTE — Progress Notes (Signed)
Chronic Care Management Pharmacy Assistant   Name: Mariah [name]  MRN: [id number] DOB: 29-Aug-1945  Reason for Encounter:Chart review for CPP on 11/11/2020.   Conditions to be addressed/monitored: CHF, HTN, COPD and Orthostatic hypotension, Arthritis, Pancytopenia,Hypokalemia,GERD ,Allergic rhinitis, CKD,  Primary concerns for visit include:N/A   Recent office visits:  10/31/2020 Chrystal Land CCM- No Medication Change Indicated. 10/10/2020 Dr. Lavon Paganini PCP- hold Lasix and use half dose metoprolol,Referral to [facility] Coordination,Ambulatory referral to [place] 08/20/2020 Dr. Lavon Paganini PCP- No Medication Change Indicated. 07/30/2020 Dr. Rockwell Germany PCP - No Medication change indicated. 06/26/2020 Laverna Peace FNP - PCP Office- Ambulatory referral to [place] placed, Start doxycycline 100 mg tablet ,Start Keflex 500 mg Capsule. 06/18/2020 Laverna Peace FNP - PCP Office - CHF referred to cardiology, Ambulatory referral to Pulmonology.  05/30/2020 Laverna Peace FNP- PCP Office --No Medication change indicated. 05/22/2020 - Carles Collet PCP Office - Start Breo 100-25 MCG/INH, Start albuterol 108 MCG/ACT inhaler   Recent consult visits:  08/02/2020 Dr.Timothy MD (Cardiology)-No medication Change Indicated 07/01/2020 Gur[facility] Maxin Neu[facility] -Increase Aricept to 10 mg nightly for memory loss prevention, 06/28/2020 Skip Estimable (orthopedic Surgery) - No Medication Changes indicated. 06/24/2020 Dr. Christia Reading MD (Cardiology) - Referral placed to wound clinic, restart lasix 40 mg daily 06/19/2020 Dr.Kurian MD (Pulmonology)-  No medication Change Indicated.   Hospital visits:  Medication Reconciliation was completed by comparing discharge summary, patient's EMR and Pharmacy list, and upon discussion with patient.  Admitted to the hospital on 09/17/2020 due to Syncope and Collapse. Discharge date was 09/20/2020. Discharged from [place]?Medications Started at [facility] Discharge:?? -started none  Medication Changes at Hospital Discharge: -Changed none  Medications Discontinued at Hospital Discharge: -Stopped  Aspirin 81 mg, ferrous [id number] C-folic acid,guaifenesin 600 mg.  Medications that remain the same after Hospital Discharge:??  -All other medications will remain the same.  Admitted to the hospital on 09/05/2020 due to weakness of both legs. Discharge date was 09/05/2020. Discharged from [facility].    New?Medications Started at [facility] Discharge:?? -started none  Medication Changes at Hospital Discharge: -Changed none  Medications Discontinued at Hospital Discharge: -Stopped none   Medications that remain the same after Hospital Discharge:??  -All other medications will remain the same.   Admitted to the hospital on 07/09/2020 due to Acute respiratory failure. Discharge date was 07/10/2020. Discharged from [place]?Medications Started at [facility] Discharge:?? -started None   Medication Changes at Hospital Discharge: -Changed None  Medications Discontinued at Hospital Discharge: -Stopped Keflex 500 mg,stopped doxycycline 100 mg   Medications that remain the same after Hospital Discharge:??  -All other medications will remain the same.   Admitted to the hospital on 07/08/2020 due to Pneumonia of right lung. Discharge date was 07/08/2020. Discharged from [facility]?Medications Started at [facility] Discharge:?? -started None  Medication Changes at Hospital Discharge: -Changed None  Medications Discontinued at Hospital Discharge: -Stopped None   Medications that remain the same after Hospital Discharge:??  -All other medications will remain the same.    Admitted to the hospital on 07/03/2020 due to COPD exacerbation. Discharge date was 07/03/2020. Discharged from [place]?Medications Started at [facility] Discharge:?? -started azithromycin 250 mg,albuterol 108 MCG/ACT, Prednisone 20 mg   Medication Changes at Hospital Discharge: -Changed None  Medications Discontinued at Hospital Discharge: -Stopped none  Medications that remain the  same after Hospital Discharge:??  -All other medications will remain the same.    Admitted to the hospital on 06/02/2020 due to Weakness. Discharge date was 06/07/2020. Discharged from [place]?Medications Started at [facility] Discharge:?? -started none  Medication Changes at Hospital Discharge: -Changed none  Medications Discontinued at Hospital Discharge: -Stopped Solu-Medrol due to no significant wheezing   Medications that remain the same after Hospital Discharge:??  -All other medications will remain the same.    Admitted to the hospital on 05/30/2020 due to Dyspnea. Discharge date was 05/30/2020. Discharged from [facility] in [place]?Medications Started at [facility] Discharge:?? -started none  Medication Changes at Hospital Discharge: -Changed None  Medications Discontinued at Hospital Discharge: -Stopped None    Medications that remain the same after Hospital Discharge:??  -All other medications will remain the same.    Admitted to the hospital on 05/26/2020 due to Blood loss anemia. Discharge date was 05/28/2020. Discharged from [place]?Medications Started at [facility] Discharge:?? -started none  Medication Changes at Hospital Discharge: -Changed none  Medications Discontinued at Hospital Discharge: -Stopped none   Medications that remain the same after Hospital Discharge:??  -All other medications will remain the same.    Admitted to the hospital on 05/19/2020 due to Fall. Discharge date was 05/19/2020. Discharged from [place]?Medications  Started at [facility] Discharge:?? -started None   Medication Changes at Hospital Discharge: -Changed None  Medications Discontinued at Hospital Discharge: -Stopped None  Medications that remain the same after Hospital Discharge:??  -All other medications will remain the same.     Medications: Outpatient Encounter Medications as of 11/04/2020  Medication Sig Note  . albuterol (VENTOLIN HFA) 108 (90 Base) MCG/ACT inhaler INHALE 1-2 PUFFS INTO THE LUNGS EVERY 6 HOURS AS NEEDED FOR WHEEZING OR SHORTNESS OF BREATH.   [name] apixaban (ELIQUIS) 5 MG TABS tablet Take 1 tablet (5 mg total) by mouth 2 (two) times daily.   . betamethasone dipropionate (DIPROLENE) 0.05 % ointment Apply topically daily.   [name] BREO ELLIPTA 100-25 MCG/INH AEPB TAKE 1 PUFF BY MOUTH EVERY DAY   . calcipotriene (DOVONOX) 0.005 % cream APPLY TO SKIN ONCE A DAY APPLY DAILY ON HANDS, FEET AND WRIST X 2 WEEKS, THEN USE 5 DAYS A WEEK   . Calcipotriene 0.005 % solution APPLY 1 A SMALL AMOUNT TO SCALP ONCE A DAY APPLY TO SCALP DAILY FOR 2 WEEKS, THEN USE ON SCALP 5 DAYS A WEEK   . Cholecalciferol (VITAMIN D3) 2000 units TABS Take 2,000 Units by mouth daily.   . clobetasol (TEMOVATE) 0.05 % external solution APPLY TO AFFECTED AREA TWICE A DAY   . COSENTYX SENSOREADY, 300 MG, 150 MG/ML SOAJ Inject 2 Syringes into the skin every 28 (twenty-eight) days.   [name] donepezil (ARICEPT) 10 MG tablet Take 10 mg by mouth daily.   . ferrous sulfate 325 (65 FE) MG tablet Take 325 mg by mouth 2 (two) times daily.   . folic acid (FOLVITE) 1 MG tablet Take 1 mg by mouth daily.   . furosemide (LASIX) 40 MG tablet Take 1 tablet (40 mg total) by mouth daily. If weight 127 lb or less, take 20 mg by mouth daily.   [name] gabapentin (NEURONTIN) 300 MG capsule Take 1 capsule (300 mg total) by mouth 2 (two) times daily.   [name] levocetirizine (XYZAL) 5 MG tablet TAKE 1 TABLET BY MOUTH EVERY  DAY IN THE EVENING (Patient taking differently: Take 5 mg by mouth every evening.)    . methotrexate (RHEUMATREX) 2.5 MG tablet Take 15 mg by mouth once a week.  09/17/2020: Monday   . metoprolol succinate (TOPROL-XL) 50 MG 24 hr tablet Take 0.5 tablets (25 mg total) by mouth daily. TAKE WITH OR IMMEDIATELY FOLLOWING A MEAL.   . pantoprazole (PROTONIX) 20 MG tablet TAKE 1 TABLET (20 MG TOTAL) BY MOUTH 2 (TWO) TIMES DAILY BEFORE A MEAL.   [name] potassium chloride SA (KLOR-CON) 20 MEQ tablet Take 2 tablets (40 mEq total) by mouth daily.   [name] rOPINIRole (REQUIP) 1 MG tablet TAKE 1 TABLET (1 MG TOTAL) BY MOUTH AT BEDTIME.   . simvastatin (ZOCOR) 20 MG tablet TAKE 1 TABLET BY MOUTH EVERY DAY (Patient taking differently: Take 20 mg by mouth daily at 6 PM.)   . traMADol (ULTRAM) 50 MG tablet TAKE 1 TABLET BY MOUTH EVERY 6 HOURS AS NEEDED FOR MODERATE OR SEVERE PAIN (Patient taking differently: Take 50 mg by mouth 2 (two) times daily. TAKE 1 TABLET BY MOUTH EVERY 6 HOURS AS NEEDED FOR MODERATE OR SEVERE PAIN)    No facility-administered encounter medications on file as of 11/04/2020.   Star Rating Drugs: Simvastatin 20 mg last filled on 09/04/2020 for [facility] Day supply at [facility].  Left message on  04/18,04/19,mailbox full 04/20,04/21  [place] Pharmacist Assistant [phone]

## 2020-11-05 ENCOUNTER — Inpatient Hospital Stay: Payer: PPO | Admitting: Internal Medicine

## 2020-11-05 ENCOUNTER — Inpatient Hospital Stay: Payer: PPO

## 2020-11-11 ENCOUNTER — Ambulatory Visit: Payer: PPO | Attending: Internal Medicine

## 2020-11-11 ENCOUNTER — Telehealth: Payer: PPO

## 2020-11-11 NOTE — Progress Notes (Deleted)
Chronic Care Management Pharmacy Note  11/11/2020 Name:  Mariah [name] MRN:  [id number] DOB:  09-Jan-1946  Subjective: Mariah [name] is an 75 y.o. year old female who is a primary patient of Bacigalupo, Dionne Bucy, MD.  The CCM team was consulted for assistance with disease management and care coordination needs.    {CCMTELEPHONEFACETOFACE:21091510} for {CCMINITIALFOLLOWUPCHOICE:21091511} in response to provider referral for pharmacy case management and/or care coordination services.   Consent to Services:  {CCMCONSENTOPTIONS:25074}  Patient Care Team: [name], MD as PCP - General (Family Medicine) Marry Guan, Laurice Record, MD as Consulting Physician (Orthopedic Surgery) Dew, Erskine Squibb, MD as Referring Physician (Vascular Surgery) Marlowe Sax, MD as Referring Physician (Internal Medicine) Neldon Labella, RN as Registered Nurse Vern Claude, LCSW as Social Worker Germaine Pomfret, [facility] (Pharmacist)  Recent office visits: 10/31/2020 Chrystal Land CCM- No Medication Change Indicated. 10/10/2020 Dr. Lavon Paganini PCP- hold Lasix and use half dose metoprolol,Referral to [facility] Coordination,Ambulatory referral to [facility] 08/20/2020 Dr. Lavon Paganini PCP- No Medication Change Indicated. 07/30/2020 Dr. Rockwell Germany PCP - No Medication change indicated. 06/26/2020 Laverna Peace FNP - PCP Office- Ambulatory referral to [place] placed, Start doxycycline 100 mg tablet ,Start Keflex 500 mg Capsule. 06/18/2020 Laverna Peace FNP - PCP Office - CHF referred to cardiology, Ambulatory referral to Pulmonology. 05/30/2020 Laverna Peace FNP- PCP Office --No Medication change indicated. 05/22/2020 - Carles Collet PCP Office - Start Breo 100-25 MCG/INH, Start albuterol 108 MCG/ACT inhaler   Recent consult visits: 08/02/2020 Dr.Timothy MD (Cardiology)-No medication Change Indicated 07/01/2020 Gurney Maxin Neurology -Increase Aricept to 10 mg  nightly for memory loss prevention, 06/28/2020 Skip Estimable (orthopedic Surgery) - No Medication Changes indicated. 06/24/2020 Dr. Christia Reading MD (Cardiology) - Referral placed to wound clinic, restart lasix 40 mg daily 06/19/2020 Dr.Kurian MD (Pulmonology)-  No medication Change Indicated.  Hospital visits: Medication Reconciliation was completed by comparing discharge summary, patient's EMR and Pharmacy list, and upon discussion with patient.  Admitted to the hospital on 09/17/2020 due to Syncope and Collapse. Discharge date was 09/20/2020. Discharged from [place]?Medications Started at [facility] Discharge:?? -started none  Medication Changes at Hospital Discharge: -Changed none  Medications Discontinued at Hospital Discharge: -Stopped  Aspirin 81 mg, ferrous [id number] C-folic acid,guaifenesin 600 mg.  Medications that remain the same after Hospital Discharge:??  -All other medications will remain the same.  Admitted to the hospital on 09/05/2020 due to weakness of both legs. Discharge date was 09/05/2020. Discharged from [facility].    New?Medications Started at [facility] Discharge:?? -started none  Medication Changes at Hospital Discharge: -Changed none  Medications Discontinued at Hospital Discharge: -Stopped none   Medications that remain the same after Hospital Discharge:??  -All other medications will remain the same.   Admitted to the hospital on 07/09/2020 due to Acute respiratory failure. Discharge date was 07/10/2020. Discharged from [place]?Medications Started at [facility] Discharge:?? -started None   Medication Changes at Hospital Discharge: -Changed None  Medications Discontinued at Hospital Discharge: -Stopped Keflex 500 mg,stopped doxycycline 100 mg   Medications that remain the same after Hospital Discharge:??  -All other medications will remain  the same.   Admitted to the hospital on 07/08/2020 due to Pneumonia of right lung. Discharge date was 07/08/2020. Discharged from [facility] [place]?Medications Started at [facility] Discharge:?? -started None  Medication Changes at Hospital Discharge: -Changed None  Medications Discontinued at  Hospital Discharge: -Stopped None   Medications that remain the same after Hospital Discharge:??  -All other medications will remain the same.    Admitted to the hospital on 07/03/2020 due to COPD exacerbation. Discharge date was 07/03/2020. Discharged from [place]?Medications Started at [facility] Discharge:?? -started azithromycin 250 mg,albuterol 108 MCG/ACT, Prednisone 20 mg   Medication Changes at Hospital Discharge: -Changed None  Medications Discontinued at Hospital Discharge: -Stopped none  Medications that remain the same after Hospital Discharge:??  -All other medications will remain the same.    Admitted to the hospital on 06/02/2020 due to Weakness. Discharge date was 06/07/2020. Discharged from [place]?Medications Started at [facility] Discharge:?? -started none  Medication Changes at Hospital Discharge: -Changed none  Medications Discontinued at Hospital Discharge: -Stopped Solu-Medrol due to no significant wheezing   Medications that remain the same after Hospital Discharge:??  -All other medications will remain the same.    Admitted to the hospital on 05/30/2020 due to Dyspnea. Discharge date was 05/30/2020. Discharged from [facility] [place]?Medications Started at [facility] Discharge:?? -started none  Medication Changes at Hospital Discharge: -Changed None  Medications Discontinued at Hospital Discharge: -Stopped None    Medications that remain the same after Hospital Discharge:??  -All other medications will remain the same.     Admitted to the hospital on 05/26/2020 due to Blood loss anemia. Discharge date was 05/28/2020. Discharged from [place]?Medications Started at [facility] Discharge:?? -started none  Medication Changes at Hospital Discharge: -Changed none  Medications Discontinued at Hospital Discharge: -Stopped none   Medications that remain the same after Hospital Discharge:??  -All other medications will remain the same.    Admitted to the hospital on 05/19/2020 due to Fall. Discharge date was 05/19/2020. Discharged from [place]?Medications Started at [facility] Discharge:?? -started None   Medication Changes at Hospital Discharge: -Changed None  Medications Discontinued at Hospital Discharge: -Stopped None  Medications that remain the same after Hospital Discharge:??  -All other medications will remain the same.    Objective:  Lab Results  Component Value Date   CREATININE 0.93 10/10/2020   BUN 11 10/10/2020   GFRNONAA >60 09/20/2020   GFRAA 54 (L) 08/20/2020   NA 139 10/10/2020   K 3.6 10/10/2020   CALCIUM 9.1 10/10/2020   CO2 26 10/10/2020   GLUCOSE 91 10/10/2020    Lab Results  Component Value Date/Time   HGBA1C 5.5 08/08/2019 11:39 AM   HGBA1C 5.4 07/26/2018 10:54 AM    Last diabetic Eye exam: No results found for: HMDIABEYEEXA  Last diabetic Foot exam: No results found for: HMDIABFOOTEX   Lab Results  Component Value Date   CHOL 166 08/20/2020   HDL 56 08/20/2020   LDLCALC 86 08/20/2020   TRIG 141 08/20/2020   CHOLHDL 3.0 08/20/2020    Hepatic Function Latest Ref Rng & Units 08/20/2020 07/08/2020 06/18/2020  Total Protein 6.0 - 8.5 g/dL 6.6 6.2(L) 6.2  Albumin 3.7 - 4.7 g/dL 4.2 3.3(L) 4.2  AST 0 - 40 IU/L 21 18 17   ALT 0 - 32 IU/L 20 14 30   Alk Phosphatase 44 - 121 IU/L 135(H) 95 144(H)  Total Bilirubin 0.0 - 1.2 mg/dL 0.7 0.7 0.4  Bilirubin, Direct 0.0 - 0.2 mg/dL - - -    Lab  Results  Component Value Date/Time   TSH 3.167 06/03/2020  07:05 PM   TSH 4.580 (H) 04/04/2020 02:14 PM   TSH 2.640 07/02/2015 11:01 AM   FREET4 0.97 04/04/2020 02:14 PM    CBC Latest Ref Rng & Units 10/10/2020 09/20/2020 09/19/2020  WBC 3.4 - 10.8 x10E3/uL 7.8 3.0(L) 3.3(L)  Hemoglobin 11.1 - 15.9 g/dL 12.8 9.4(L) 10.1(L)  Hematocrit 34.0 - 46.6 % 36.5 26.5(L) 29.2(L)  Platelets 150 - 450 x10E3/uL 179 104(L) 57(L)    No results found for: VD25OH  Clinical ASCVD: {YES/NO:21197} The 10-year ASCVD risk score Mikey Bussing DC Jr., et al., 2013) is: 25%   Values used to calculate the score:     Age: 63 years     Sex: Female     Is Non-Hispanic African American: No     Diabetic: No     Tobacco smoker: Yes     Systolic Blood Pressure: 785 mmHg     Is BP treated: Yes     HDL Cholesterol: 56 mg/dL     Total Cholesterol: 166 mg/dL    Depression screen [facility] 2/9 10/31/2020 08/20/2020 07/30/2020  Decreased Interest 0 0 0  Down, Depressed, Hopeless 0 0 0  PHQ - 2 Score 0 0 0  Altered sleeping - 0 0  Tired, decreased energy - 3 3  Change in appetite - 0 0  Feeling bad or failure about yourself  - 0 0  Trouble concentrating - 0 0  Moving slowly or fidgety/restless - 0 0  Suicidal thoughts - 0 0  PHQ-9 Score - 3 3  Difficult doing work/chores - Not difficult at all Not difficult at all     ***Other: (CHADS2VASc if Afib, MMRC or CAT for COPD, ACT, DEXA)  Social History   Tobacco Use  Smoking Status Current Every Day Smoker  . Packs/day: 1.50  . Years: 40.00  . Pack years: 60.00  Smokeless Tobacco Never Used  Tobacco Comment   0.5 PPD 06/19/2020   BP Readings from Last 3 Encounters:  10/10/20 112/67  09/20/20 127/68  08/20/20 136/86   Pulse Readings from Last 3 Encounters:  10/10/20 69  09/20/20 72  08/02/20 85   Wt Readings from Last 3 Encounters:  10/10/20 126 lb (57.2 kg)  09/20/20 124 lb (56.2 kg)  08/20/20 126 lb 11.2 oz (57.5 kg)   BMI Readings from Last 3 Encounters:   10/10/20 23.81 kg/m  09/20/20 23.43 kg/m  08/20/20 23.94 kg/m    Assessment/Interventions: Review of patient past medical history, allergies, medications, health status, including review of consultants reports, laboratory and other test data, was performed as part of comprehensive evaluation and provision of chronic care management services.   SDOH:  (Social Determinants of Health) assessments and interventions performed: {yes/no:20286}  SDOH Screenings   Alcohol Screen: Low Risk   . Last Alcohol Screening Score (AUDIT): 2  Depression (PHQ2-9): Low Risk   . PHQ-2 Score: 0  Financial Resource Strain: Not on file  Food Insecurity: No Food Insecurity  . Worried About Charity fundraiser in the Last Year: Never true  . Ran Out of Food in the Last Year: Never true  Housing: Low Risk   . Last Housing Risk Score: 0  Physical Activity: Not on file  Social Connections: Not on file  Stress: No Stress Concern Present  . Feeling of Stress : Not at all  Tobacco Use: High Risk  . Smoking Tobacco Use: Current Every Day Smoker  . Smokeless Tobacco Use: Never Used  Transportation Needs: No Transportation Needs  . Lack  of Transportation (Medical): No  . Lack of Transportation (Non-Medical): No    CCM Care Plan  Allergies  Allergen Reactions  . Amoxicillin Diarrhea  . Clindamycin/Lincomycin     diarrhea    Medications Reviewed Today    Reviewed by [name], MD (Physician) on 10/11/20 at 1142  Med List Status: <None>  Medication Order Taking? Sig Documenting Provider Last Dose Status Informant  albuterol (VENTOLIN HFA) 108 (90 Base) MCG/ACT inhaler [id number] No INHALE 1-2 PUFFS INTO THE LUNGS EVERY 6 HOURS AS NEEDED FOR WHEEZING OR SHORTNESS OF BREATH. Trinna Post, PA-C unknown prn Active Family Member  apixaban (ELIQUIS) 5 MG TABS tablet [id number] No Take 1 tablet (5 mg total) by mouth 2 (two) times daily. Nita Sells, MD 09/17/2020 Unknown time Active Family  Member  betamethasone dipropionate (DIPROLENE) 0.05 % ointment [id number] No Apply topically daily. Ralene Bathe, MD Past Month Unknown time Active Family Member  [place]air Patter 100-25 MCG/INH AEPB [id number] No TAKE 1 PUFF BY MOUTH EVERY DAY [name], MD 09/16/2020 Unknown time Active Family Member  calcipotriene (DOVONOX) 0.005 % cream [id number] No APPLY TO SKIN ONCE A DAY APPLY DAILY ON HANDS, FEET AND WRIST X 2 WEEKS, THEN USE 5 DAYS A WEEK Ralene Bathe, MD Past Month Unknown time Active Family Member  Calcipotriene 0.005 % solution [id number] No APPLY 1 A SMALL AMOUNT TO SCALP ONCE A DAY APPLY TO SCALP DAILY FOR 2 WEEKS, THEN USE ON SCALP 5 DAYS A WEEK Ralene Bathe, MD Past Month Unknown time Active Family Member  Cholecalciferol (VITAMIN D3) 2000 units TABS [id number] No Take 2,000 Units by mouth daily. [provider] 09/17/2020 Unknown time Active Family Member  clobetasol (TEMOVATE) 0.05 % external solution [id number] No APPLY TO AFFECTED AREA TWICE A DAY Bacigalupo, Dionne Bucy, MD Past Month Unknown time Active Family Member  COSENTYX SENSOREADY, 300 MG, 150 MG/ML SOAJ [id number] No Inject 2 Syringes into the skin every 28 (twenty-eight) days. [provider] Past Month Unknown time Active Family Member           Med Note Janan Ridge   Fri Aug 02, 2020  3:22 PM)    donepezil (ARICEPT) 10 MG tablet [id number] No Take 10 mg by mouth daily. [provider] 09/17/2020 Unknown time Active Family Member  ferrous sulfate 325 (65 FE) MG tablet [id number] No Take 325 mg by mouth 2 (two) times daily. [provider] 09/17/2020 Unknown time Active Family Member  folic acid (FOLVITE) 1 MG tablet [id number] No Take 1 mg by mouth daily. [provider] 09/17/2020 Unknown time Active Family Member  furosemide (LASIX) 40 MG tablet [id number] No Take 1 tablet (40 mg total) by mouth daily. If weight 127 lb or less, take 20 mg by mouth daily. Minna Merritts, MD 09/17/2020 Unknown time Active Family Member  gabapentin (NEURONTIN) 300 MG capsule [id number]  Take 1 capsule (300 mg total) by mouth 2 (two) times daily. [name], MD  Active   levocetirizine (XYZAL) 5 MG tablet [id number] No TAKE 1 TABLET BY MOUTH EVERY DAY IN THE EVENING  Patient taking differently: Take 5 mg by mouth every evening.   [name], MD 09/16/2020 Unknown time Active Family Member  methotrexate (RHEUMATREX) 2.5 MG tablet [id number] No Take 15 mg by mouth once a week.  [provider] 09/16/2020 Unknown time Active Family Member           Med Note [place], [facility]  H   Tue Sep 17, 2020  8:31 PM) Monday   metoprolol succinate (TOPROL-XL) 50 MG 24 hr tablet [id number]  Take 0.5 tablets (25 mg total) by mouth daily. TAKE WITH OR IMMEDIATELY FOLLOWING A MEAL. Jennye Boroughs, MD  Active   pantoprazole (PROTONIX) 20 MG tablet [id number] No TAKE 1 TABLET (20 MG TOTAL) BY MOUTH 2 (TWO) TIMES DAILY BEFORE A MEAL. [name], MD 09/17/2020 Unknown time Active Family Member  potassium chloride SA (KLOR-CON) 20 MEQ tablet [id number] No Take 2 tablets (40 mEq total) by mouth daily. Nita Sells, MD 09/17/2020 Unknown time Active Family Member  rOPINIRole (REQUIP) 1 MG tablet [id number] No TAKE 1 TABLET (1 MG TOTAL) BY MOUTH AT BEDTIME. [name], MD 09/16/2020 Unknown time Active Family Member  simvastatin (ZOCOR) 20 MG tablet [id number] No TAKE 1 TABLET BY MOUTH EVERY DAY  Patient taking differently: Take 20 mg by mouth daily at 6 PM.   [name], MD 09/16/2020 Unknown time Active Family Member  traMADol (ULTRAM) 50 MG tablet [id number] No TAKE 1 TABLET BY MOUTH EVERY 6 HOURS AS NEEDED FOR MODERATE OR SEVERE PAIN  Patient taking differently: Take 50 mg by mouth 2 (two) times daily. TAKE 1 TABLET BY MOUTH EVERY 6 HOURS AS NEEDED FOR MODERATE OR SEVERE PAIN   [name], MD 09/17/2020 Unknown time Active Family Member           Patient Active Problem List   Diagnosis Date Noted  . Thrombocytopenia ([place]) 09/19/2020  . Hypokalemia 09/19/2020  . Orthostatic hypotension 09/18/2020  . Syncope 09/17/2020  . History of DVT (deep vein thrombosis) 07/09/2020  . 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 Hematoma of right hip 06/18/2020  . Congestive heart failure ([place]) 06/18/2020  . Weakness   . Closed nondisplaced fracture of distal phalanx of right little finger 06/06/2020  . Closed nondisplaced fracture of distal phalanx of right thumb 06/06/2020  . Protein-calorie malnutrition, severe 06/05/2020  . Hypoalbuminemia 06/03/2020  . Cutaneous horn 06/03/2020  . Pleural effusion 06/03/2020  . Chronic diastolic CHF (congestive heart failure) ([place]) 06/03/2020  . Acute respiratory failure with hypoxia ([place]) 06/02/2020  . Irregular heartbeat 05/30/2020  . Head injury 05/30/2020  . Bilateral edema of lower extremity 05/30/2020  . Closed fracture of right hand 05/30/2020  . Blood transfusion reaction 05/30/2020  . Pancytopenia ([place]) 05/26/2020  . CKD (chronic kidney disease), stage III ([place]) 05/26/2020  . Hematoma of right thigh 05/26/2020  . Finger fracture, right 05/26/2020  . Dyspnea 05/26/2020  . Acute deep vein thrombosis (DVT) of tibial vein of left lower extremity ([place]) 04/05/2020  . Mental confusion 04/01/2020  . Unintentional weight loss 03/01/2020  . Fall (on)(from) sidewalk curb, initial encounter 03/01/2020  . Memory loss 03/01/2020  . Rash of back 05/15/2019  . Allergic rhinitis 01/10/2018  . Cholelithiasis 03/16/2016  . Peripheral arterial disease ([place]) 02/05/2016  . 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 Abdominal aortic aneurysm (AAA) ([place]) 01/13/2016  . 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 Nerve root inflammation 10/25/2015  . Psoriatic arthritis ([place]) 10/07/2015  . 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 Right hip pain 08/12/2015  . Arthralgia of multiple joints 08/12/2015  .  Age related osteoporosis 08/12/2015  . COPD with acute exacerbation ([place]) 06/24/2015  . Senile purpura ([place]) 06/24/2015  . Abnormal serum level of alkaline phosphatase 06/18/2015  . Elevated hemoglobin ([place]) 06/18/2015  . Lethargy 06/18/2015  . Fistula 06/18/2015  . Gastro-esophageal reflux disease  without esophagitis 06/18/2015  . Restless leg 06/18/2015  . Avitaminosis D 06/18/2015  . Mechanical and motor problems with internal organs 10/11/2009  . Hypercholesteremia 10/11/2009  . Arthritis, degenerative 09/25/2009  . Essential (primary) hypertension 09/25/2009  . Psoriasis 09/25/2009  . Compulsive tobacco user syndrome 09/25/2009    Immunization History  Administered Date(s) Administered  . Fluad Quad(high Dose 65+) 04/04/2020  . Influenza, High Dose Seasonal PF 06/24/2015, 03/16/2016, 07/15/2017, 06/05/2018, 07/12/2019  . Influenza-Unspecified 06/01/2018  . PFIZER Comirnaty(Gray Top)Covid-19 Tri-Sucrose Vaccine 10/23/2020  . PFIZER(Purple Top)SARS-COV-2 Vaccination 07/30/2020, 08/20/2020  . Pneumococcal Conjugate-13 06/24/2015  . Pneumococcal Polysaccharide-23 03/26/2011  . Tdap 03/26/2011    Conditions to be addressed/monitored:  Hypertension, Hyperlipidemia, Heart Failure, GERD, COPD, Chronic Kidney Disease, Osteoporosis, Tobacco use and Chronic Pain, Memory loss and peripheral artery disease  There are no care plans that you recently modified to display for this patient.    Medication Assistance: {MEDASSISTANCEINFO:25044}  Patient's preferred pharmacy is:  [id number] [id number]: [phone]: [phone] Uses pill box? {Yes or If no, why not?:20788} Pt endorses ***% compliance  We discussed: {Pharmacy options:24294} Patient decided to: {US Pharmacy Plan:23885}  Care Plan and Follow Up Patient Decision:  {FOLLOWUP:24991}  Plan: {CM FOLLOW UP PTZGY:17494} ***   Current  Barriers:  . {pharmacybarriers:24917}  Pharmacist Clinical Goal(s):  .Marland KitchenPatient will {PHARMACYGOALCHOICES:24921} through collaboration with PharmD and provider.   Interventions: . 1:1 collaboration with BBrita RompADionne Bucy MD regarding development and update of comprehensive plan of care as evidenced by provider attestation and co-signature . Inter-disciplinary care team collaboration (see longitudinal plan of care) . Comprehensive medication review performed; medication list updated in electronic medical record  Heart Failure (Goal: manage symptoms and prevent exacerbations) -{US controlled/uncontrolled:25276} -Last ejection fraction: *** (Date: ***) -HF type: Diastolic -NYHA Class: I (no actitivty limitation) -AHA HF Stage: B (Heart disease present - no symptoms present) -Current treatment: . Furseomide 40 mg daily  . Metoprolol XL 50 mg 1/2 tablet daily  -Medications previously tried: ***  -Current home BP/HR readings: *** -Current dietary habits: *** -Current exercise habits: *** -Educated on {CCM HF Counseling:25125} -{CCMPHARMDINTERVENTION:25122}  Hyperlipidemia: (LDL goal < 70) -Uncontrolled -Current treatment: . Simvastatin 20 mg daily  -Medications previously tried: ***  -Current dietary patterns: *** -Current exercise habits: *** -Educated on {CCM HLD Counseling:25126} -{CCMPHARMDINTERVENTION:25122}  COPD (Goal: control symptoms and prevent exacerbations) -{US controlled/uncontrolled:25276} -Current treatment  . Ventolin HFA 1-2 puff every 6 hours as needed . Breo 1 puff daily  -Medications previously tried: ***  -Gold Grade: {CHL HP Upstream Pharm COPD Gold GWHQPR:[id number]}-Current COPD Classification:  {CHL HP Upstream Pharm COPD [phone]} -MMRC/CAT score: *** -Pulmonary function testing: NA -Exacerbations requiring treatment in last 6 months: *** -Patient {Actions; denies-reports:120008} consistent use of maintenance inhaler -Frequency  of rescue inhaler use: *** -Counseled on {CCMINHALERCOUNSELING:25121} -{CCMPHARMDINTERVENTION:25122}  Tobacco use (Goal ***) -{US controlled/uncontrolled:25276} -Previous quit attempts: *** -Current treatment  . *** -Patient smokes {Time to first cigarette:23873} -Patient triggers include: {Smoking Triggers:23882} -On a scale of 1-10, reports MOTIVATION to quit is *** -On a scale of 1-10, reports CONFIDENCE in quitting is *** -{Smoking Cessation Counseling:23883} -{CCMPHARMDINTERVENTION:25122}  Osteoporosis / Osteopenia (Goal ***) -{US controlled/uncontrolled:25276} -Last DEXA Scan: ***   T-Score femoral neck: ***  T-Score total hip: ***  T-Score lumbar spine: ***  T-Score forearm radius: ***  10-year probability of major osteoporotic fracture: ***  10-year probability of hip  fracture: *** -Patient {is;is not an osteoporosis candidate:23886} -Current treatment  . *** -Medications previously tried: ***  -{Osteoporosis Counseling:23892} -{CCMPHARMDINTERVENTION:25122}  History of DVT (Goal: ***) -{US controlled/uncontrolled:25276} -Current treatment  . Eliquis  -Medications previously tried: ***  -{CCMPHARMDINTERVENTION:25122}  *** (Goal: ***) -{US controlled/uncontrolled:25276} -Current treatment  . Cosentyx 300 mg 2 syringes every 28 days . Folic acid 1 mg daily  . Methotrexate 2.5 mg 15 mg weekly  -Medications previously tried: ***  -{CCMPHARMDINTERVENTION:25122}  *** (Goal: ***) -{US controlled/uncontrolled:25276} -Current treatment  . Donepezil 10 mg daily  -Medications previously tried: ***  -{CCMPHARMDINTERVENTION:25122}    Patient Goals/Self-Care Activities . Patient will:  - {pharmacypatientgoals:24919}  Follow Up Plan: {CM FOLLOW UP TYYP:49611}

## 2020-11-12 ENCOUNTER — Other Ambulatory Visit: Payer: Self-pay

## 2020-11-14 ENCOUNTER — Telehealth: Payer: Self-pay | Admitting: *Deleted

## 2020-11-14 ENCOUNTER — Other Ambulatory Visit: Payer: Self-pay

## 2020-11-14 DIAGNOSIS — R55 Syncope and collapse: Secondary | ICD-10-CM

## 2020-11-14 NOTE — Telephone Encounter (Signed)
  Chronic Care Management   Outreach Note  11/14/2020 Name: Mariah [name] MRN: [id number] DOB: 08/04/1945  Referred by: [name], MD Reason for referral : No chief complaint on file.   An unsuccessful telephone outreach was attempted today. The patient was referred to the case management team for assistance with care management and care coordination.   Follow Up Plan: Telephone follow up appointment with care management team member scheduled for: 11/21/20   Elliot Gurney, [place] Worker  [facility] Care Management [phone]

## 2020-11-15 ENCOUNTER — Telehealth: Payer: Self-pay

## 2020-11-15 NOTE — Progress Notes (Signed)
    Chronic Care Management Pharmacy Assistant   Name: Mariah [name]  MRN: [id number] DOB: [birth year]  Reason for Encounter: Medication [facility] visits:  None in previous 6 months  Medications: Outpatient Encounter Medications as of 11/15/2020  Medication Sig Note  . albuterol (VENTOLIN HFA) 108 (90 Base) MCG/ACT inhaler INHALE 1-2 PUFFS INTO THE LUNGS EVERY 6 HOURS AS NEEDED FOR WHEEZING OR SHORTNESS OF BREATH.   [name] apixaban (ELIQUIS) 5 MG TABS tablet Take 1 tablet (5 mg total) by mouth 2 (two) times daily.   . betamethasone dipropionate (DIPROLENE) 0.05 % ointment Apply topically daily.   [name] BREO ELLIPTA 100-25 MCG/INH AEPB TAKE 1 PUFF BY MOUTH EVERY DAY   . calcipotriene (DOVONOX) 0.005 % cream APPLY TO SKIN ONCE A DAY APPLY DAILY ON HANDS, FEET AND WRIST X 2 WEEKS, THEN USE 5 DAYS A WEEK   . Calcipotriene 0.005 % solution APPLY 1 A SMALL AMOUNT TO SCALP ONCE A DAY APPLY TO SCALP DAILY FOR 2 WEEKS, THEN USE ON SCALP 5 DAYS A WEEK   . Cholecalciferol (VITAMIN D3) 2000 units TABS Take 2,000 Units by mouth daily.   . clobetasol (TEMOVATE) 0.05 % external solution APPLY TO AFFECTED AREA TWICE A DAY   . COSENTYX SENSOREADY, 300 MG, 150 MG/ML SOAJ Inject 2 Syringes into the skin every 28 (twenty-eight) days.   [name] donepezil (ARICEPT) 10 MG tablet Take 10 mg by mouth daily.   . ferrous sulfate 325 (65 FE) MG tablet Take 325 mg by mouth 2 (two) times daily.   . folic acid (FOLVITE) 1 MG tablet Take 1 mg by mouth daily.   . furosemide (LASIX) 40 MG tablet Take 1 tablet (40 mg total) by mouth daily. If weight 127 lb or less, take 20 mg by mouth daily.   [name] gabapentin (NEURONTIN) 300 MG capsule Take 1 capsule (300 mg total) by mouth 2 (two) times daily.   [name] levocetirizine (XYZAL) 5 MG tablet TAKE 1 TABLET BY MOUTH EVERY DAY IN THE EVENING (Patient taking differently: Take 5 mg by mouth every evening.)   . methotrexate (RHEUMATREX) 2.5 MG tablet Take 15 mg by mouth once a week.  09/17/2020: Monday    . metoprolol succinate (TOPROL-XL) 50 MG 24 hr tablet Take 0.5 tablets (25 mg total) by mouth daily. TAKE WITH OR IMMEDIATELY FOLLOWING A MEAL.   . pantoprazole (PROTONIX) 20 MG tablet TAKE 1 TABLET (20 MG TOTAL) BY MOUTH 2 (TWO) TIMES DAILY BEFORE A MEAL.   [name] potassium chloride SA (KLOR-CON) 20 MEQ tablet Take 2 tablets (40 mEq total) by mouth daily.   [name] rOPINIRole (REQUIP) 1 MG tablet TAKE 1 TABLET (1 MG TOTAL) BY MOUTH AT BEDTIME.   . simvastatin (ZOCOR) 20 MG tablet TAKE 1 TABLET BY MOUTH EVERY DAY (Patient taking differently: Take 20 mg by mouth daily at 6 PM.)   . traMADol (ULTRAM) 50 MG tablet TAKE 1 TABLET BY MOUTH EVERY 6 HOURS AS NEEDED FOR MODERATE OR SEVERE PAIN (Patient taking differently: Take 50 mg by mouth 2 (two) times daily. TAKE 1 TABLET BY MOUTH EVERY 6 HOURS AS NEEDED FOR MODERATE OR SEVERE PAIN)    No facility-administered encounter medications on file as of 11/15/2020.   Performed cost analysis for patient, estimated yearly medication cost around $316.00.  [place] Pharmacist Assistant [phone]

## 2020-11-18 ENCOUNTER — Encounter: Payer: Self-pay | Admitting: Family Medicine

## 2020-11-18 ENCOUNTER — Ambulatory Visit (INDEPENDENT_AMBULATORY_CARE_PROVIDER_SITE_OTHER): Payer: PPO | Admitting: Family Medicine

## 2020-11-18 ENCOUNTER — Other Ambulatory Visit: Payer: Self-pay

## 2020-11-18 VITALS — BP 96/65 | HR 84 | Temp 98.1°F | Resp 16 | Ht 60.0 in | Wt 127.2 lb

## 2020-11-18 DIAGNOSIS — Z0000 Encounter for general adult medical examination without abnormal findings: Secondary | ICD-10-CM | POA: Diagnosis not present

## 2020-11-18 DIAGNOSIS — M810 Age-related osteoporosis without current pathological fracture: Secondary | ICD-10-CM

## 2020-11-18 DIAGNOSIS — Z1231 Encounter for screening mammogram for malignant neoplasm of breast: Secondary | ICD-10-CM | POA: Diagnosis not present

## 2020-11-18 NOTE — Patient Instructions (Signed)
Preventive Care 76 Years and Older, Female Preventive care refers to lifestyle choices and visits with your health care provider that can promote health and wellness. This includes:  A yearly physical exam. This is also called an annual wellness visit.  Regular dental and eye exams.  Immunizations.  Screening for certain conditions.  Healthy lifestyle choices, such as: ? Eating a healthy diet. ? Getting regular exercise. ? Not using drugs or products that contain nicotine and tobacco. ? Limiting alcohol use. What can I expect for my preventive care visit? Physical exam Your health care provider will check your:  Height and weight. These may be used to calculate your BMI (body mass index). BMI is a measurement that tells if you are at a healthy weight.  Heart rate and blood pressure.  Body temperature.  Skin for abnormal spots. Counseling Your health care provider may ask you questions about your:  Past medical problems.  Family's medical history.  Alcohol, tobacco, and drug use.  Emotional well-being.  Home life and relationship well-being.  Sexual activity.  Diet, exercise, and sleep habits.  History of falls.  Memory and ability to understand (cognition).  Work and work Statistician.  Pregnancy and menstrual history.  Access to firearms. What immunizations do I need? Vaccines are usually given at various ages, according to a schedule. Your health care provider will recommend vaccines for you based on your age, medical history, and lifestyle or other factors, such as travel or where you work.   What tests do I need? Blood tests  Lipid and cholesterol levels. These may be checked every 5 years, or more often depending on your overall health.  Hepatitis C test.  Hepatitis B test. Screening  Lung cancer screening. You may have this screening every year starting at age 76 if you have a 30-pack-year history of smoking and currently smoke or have quit within  the past 15 years.  Colorectal cancer screening. ? All adults should have this screening starting at age 44 and continuing until age 58. ? Your health care provider may recommend screening at age 2 if you are at increased risk. ? You will have tests every 1-10 years, depending on your results and the type of screening test.  Diabetes screening. ? This is done by checking your blood sugar (glucose) after you have not eaten for a while (fasting). ? You may have this done every 1-3 years.  Mammogram. ? This may be done every 1-2 years. ? Talk with your health care provider about how often you should have regular mammograms.  Abdominal aortic aneurysm (AAA) screening. You may need this if you are a current or former smoker.  BRCA-related cancer screening. This may be done if you have a family history of breast, ovarian, tubal, or peritoneal cancers. Other tests  STD (sexually transmitted disease) testing, if you are at risk.  Bone density scan. This is done to screen for osteoporosis. You may have this done starting at age 76. Talk with your health care provider about your test results, treatment options, and if necessary, the need for more tests. Follow these instructions at home: Eating and drinking  Eat a diet that includes fresh fruits and vegetables, whole grains, lean protein, and low-fat dairy products. Limit your intake of foods with high amounts of sugar, saturated fats, and salt.  Take vitamin and mineral supplements as recommended by your health care provider.  Do not drink alcohol if your health care provider tells you not to drink.  If you drink alcohol: ? Limit how much you have to 0-1 drink a day. ? Be aware of how much alcohol is in your drink. In the [place] one drink equals one 12 oz bottle of beer (355 mL), one 5 oz glass of wine (148 mL), or one 1 oz glass of hard liquor (44 mL).   Lifestyle  Take daily care of your teeth and gums. Brush your teeth every morning  and night with fluoride toothpaste. Floss one time each day.  Stay active. Exercise for at least 30 minutes 5 or more days each week.  Do not use any products that contain nicotine or tobacco, such as cigarettes, e-cigarettes, and chewing tobacco. If you need help quitting, ask your health care provider.  Do not use drugs.  If you are sexually active, practice safe sex. Use a condom or other form of protection in order to prevent STIs (sexually transmitted infections).  Talk with your health care provider about taking a low-dose aspirin or statin.  Find healthy ways to cope with stress, such as: ? Meditation, yoga, or listening to music. ? Journaling. ? Talking to a trusted person. ? Spending time with friends and family. Safety  Always wear your seat belt while driving or riding in a vehicle.  Do not drive: ? If you have been drinking alcohol. Do not ride with someone who has been drinking. ? When you are tired or distracted. ? While texting.  Wear a helmet and other protective equipment during sports activities.  If you have firearms in your house, make sure you follow all gun safety procedures. What's next?  Visit your health care provider once a year for an annual wellness visit.  Ask your health care provider how often you should have your eyes and teeth checked.  Stay up to date on all vaccines. This information is not intended to replace advice given to you by your health care provider. Make sure you discuss any questions you have with your health care provider. Document Revised: 06/26/2020 Document Reviewed: 06/30/2018 [employer] Patient Education  2021 [employer]  

## 2020-11-18 NOTE — Progress Notes (Signed)
Annual Wellness Visit     Patient: Mariah [name], Female    DOB: 10/22/1945, 75 y.o.   MRN: [id number] Visit Date: 11/18/2020  Today's Provider: Shirlee LatchAngela Tynetta Bachmann, MD   Chief Complaint  Patient presents with  . Medicare Wellness   Subjective    Mariah Napoleonnna R Lehane is a 75 y.o. female who presents today for her Annual Wellness Visit. She reports consuming a general diet. The patient does not participate in regular exercise at present. She generally feels fairly well. She reports sleeping well. She does not have additional problems to discuss today.    Falls Since her last visit, she denies any falls.  Psoriasis She states that her Psoriasis is "awful". It is worse her arms. She attributes the recent exacerbations to allergy season.    HPI 10/23/19 Mammogram-BI-RADS 1 08/08/18 BMD-Osteoporosis 08/10/16 Colonoscopy  Patient Active Problem List   Diagnosis Date Noted  . Hypokalemia 09/19/2020  . Orthostatic hypotension 09/18/2020  . Syncope 09/17/2020  . History of DVT (deep vein thrombosis) 07/09/2020  . 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 Congestive heart failure (HCC) 06/18/2020  . Weakness   . Protein-calorie malnutrition, severe 06/05/2020  . Hypoalbuminemia 06/03/2020  . Cutaneous horn 06/03/2020  . Pleural effusion 06/03/2020  . Chronic diastolic CHF (congestive heart failure) (HCC) 06/03/2020  . Acute respiratory failure with hypoxia (HCC) 06/02/2020  . Irregular heartbeat 05/30/2020  . Bilateral edema of lower extremity 05/30/2020  . Blood transfusion reaction 05/30/2020  . CKD (chronic kidney disease), stage III (HCC) 05/26/2020  . Acute deep vein thrombosis (DVT) of tibial vein of left lower extremity (HCC) 04/05/2020  . Mental confusion 04/01/2020  . Unintentional weight loss 03/01/2020  . Memory loss 03/01/2020  . Allergic rhinitis 01/10/2018  . Cholelithiasis 03/16/2016  . Peripheral arterial disease (HCC) 02/05/2016  . 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 Abdominal aortic aneurysm (AAA) (HCC) 01/13/2016  . 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 Nerve root inflammation 10/25/2015  . Psoriatic arthritis (HCC) 10/07/2015  . 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 Right hip pain 08/12/2015  . Arthralgia of multiple joints 08/12/2015  . Age related osteoporosis 08/12/2015  . COPD with acute exacerbation (HCC) 06/24/2015  . Senile purpura (HCC) 06/24/2015  . Abnormal serum level of alkaline phosphatase 06/18/2015  . Lethargy 06/18/2015  . Gastro-esophageal reflux disease without esophagitis 06/18/2015  . Restless leg 06/18/2015  . Avitaminosis D 06/18/2015  . Mechanical and motor problems with internal organs 10/11/2009  . Hypercholesteremia 10/11/2009  . Arthritis, degenerative 09/25/2009  . Essential (primary) hypertension 09/25/2009  . Psoriasis 09/25/2009  . Compulsive tobacco user syndrome 09/25/2009   Past Surgical History:  Procedure Laterality Date  . APPENDECTOMY  2000  . BREAST BIOPSY Right 09/2016   radial scar;COMPLEX SCLEROSING LESION  . BREAST LUMPECTOMY Right 11/24/2016   COMPLEX SCLEROSING LESION  . BREAST LUMPECTOMY WITH NEEDLE LOCALIZATION Right 11/24/2016   Procedure: BREAST LUMPECTOMY WITH NEEDLE LOCALIZATION;  Surgeon: Nadeen LandauSmith, Jarvis Wilton, MD;  Location: [facility] ORS;  Service: General;  Laterality: Right;  . COLONOSCOPY WITH PROPOFOL N/A 08/10/2016   Procedure: COLONOSCOPY WITH PROPOFOL;  Surgeon: Scot Junobert T Elliott, MD;  [facility];  Service: Endoscopy;  Laterality: N/A;  . ESOPHAGOGASTRODUODENOSCOPY (EGD) WITH PROPOFOL N/A 08/10/2016   Procedure: ESOPHAGOGASTRODUODENOSCOPY (EGD) WITH PROPOFOL;  Surgeon: Scot Junobert T Elliott, MD;  [facility];  Service: Endoscopy;  Laterality: N/A;  . FOOT SURGERY Bilateral   . JOINT REPLACEMENT Right 01/27/2016   hip  .  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 TOTAL HIP ARTHROPLASTY Right 01/27/2016   Procedure: TOTAL HIP ARTHROPLASTY;  Surgeon:  Dereck Leep, MD;  Location: [facility] ORS;  Service: Orthopedics;  Laterality: Right;   Social History   Socioeconomic History  . Marital status: Divorced    Spouse name: Not on file  . Number of children: 0  . Years of education: 85  . Highest education level: Associate degree: occupational, Hotel manager, or vocational program  Occupational History    Comment: retired  Tobacco Use  . Smoking status: Current Every Day Smoker    Packs/day: 1.50    Years: 40.00    Pack years: 60.00  . Smokeless tobacco: Never Used  . Tobacco comment: 0.5 PPD 06/19/2020  Vaping Use  . Vaping Use: Some days  Substance and Sexual Activity  . Alcohol use: Yes    Comment: occasionally glass of wine  . Drug use: No  . Sexual activity: Not Currently  Other Topics Concern  . Not on file  Social History Narrative   Lives self; in [place]; smoke 1ppd; no alcohol. 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    Social Determinants of Health   Financial Resource Strain: Not on file  Food Insecurity: No Food Insecurity  . Worried About Charity fundraiser in the Last Year: Never true  . Ran Out of Food in the Last Year: Never true  Transportation Needs: No Transportation Needs  . Lack of Transportation (Medical): No  . Lack of Transportation (Non-Medical): No  Physical Activity: Not on file  Stress: No Stress Concern Present  . Feeling of Stress : Not at all  Social Connections: Not on file  Intimate Partner Violence: Not on file   Family History  Problem Relation Age of Onset  . Cancer Mother        kidney  . Arthritis Father   . Heart disease Father   . COPD Sister   . Fibromyalgia Sister   . Osteoarthritis Sister   . Osteoarthritis Maternal Aunt   . Colon cancer Paternal Aunt   . Colon cancer Paternal Uncle   . Colon cancer Paternal Grandmother   . Breast cancer Neg Hx    Allergies  Allergen Reactions  . Amoxicillin Diarrhea  . Clindamycin/Lincomycin     diarrhea        Medications: Outpatient Medications Prior to Visit  Medication Sig  . albuterol (VENTOLIN HFA) 108 (90 Base) MCG/ACT inhaler INHALE 1-2 PUFFS INTO THE LUNGS EVERY 6 HOURS AS NEEDED FOR WHEEZING OR SHORTNESS OF BREATH.  . betamethasone dipropionate (DIPROLENE) 0.05 % ointment Apply topically daily.  [name] BREO ELLIPTA 100-25 MCG/INH AEPB TAKE 1 PUFF BY MOUTH EVERY DAY  . calcipotriene (DOVONOX) 0.005 % cream APPLY TO SKIN ONCE A DAY APPLY DAILY ON HANDS, FEET AND WRIST X 2 WEEKS, THEN USE 5 DAYS A WEEK  . Calcipotriene 0.005 % solution APPLY 1 A SMALL AMOUNT TO SCALP ONCE A DAY APPLY TO SCALP DAILY FOR 2 WEEKS, THEN USE ON SCALP 5 DAYS A WEEK  . Cholecalciferol (VITAMIN D3) 2000 units TABS Take 2,000 Units by mouth daily.  . clobetasol (TEMOVATE) 0.05 % external solution APPLY TO AFFECTED AREA TWICE A DAY  . COSENTYX SENSOREADY, 300 MG, 150 MG/ML SOAJ Inject 2 Syringes into the skin every 28 (twenty-eight) days.  [name] donepezil (ARICEPT) 10 MG tablet Take 10 mg by mouth daily.  . ferrous sulfate 325 (65 FE) MG tablet  Take 325 mg by mouth 2 (two) times daily.  . folic acid (FOLVITE) 1 MG tablet Take 1 mg by mouth daily.  . furosemide (LASIX) 40 MG tablet Take 1 tablet (40 mg total) by mouth daily. If weight 127 lb or less, take 20 mg by mouth daily.  [name] gabapentin (NEURONTIN) 300 MG capsule Take 1 capsule (300 mg total) by mouth 2 (two) times daily.  [name] levocetirizine (XYZAL) 5 MG tablet TAKE 1 TABLET BY MOUTH EVERY DAY IN THE EVENING (Patient taking differently: Take 5 mg by mouth every evening.)  . methotrexate (RHEUMATREX) 2.5 MG tablet Take 15 mg by mouth once a week.   . metoprolol succinate (TOPROL-XL) 50 MG 24 hr tablet Take 0.5 tablets (25 mg total) by mouth daily. TAKE WITH OR IMMEDIATELY FOLLOWING A MEAL.  . pantoprazole (PROTONIX) 20 MG tablet TAKE 1 TABLET (20 MG TOTAL) BY MOUTH 2 (TWO) TIMES DAILY BEFORE A MEAL.  [name] potassium chloride SA (KLOR-CON) 20 MEQ tablet Take 2 tablets (40 mEq total)  by mouth daily.  [name] rOPINIRole (REQUIP) 1 MG tablet TAKE 1 TABLET (1 MG TOTAL) BY MOUTH AT BEDTIME.  . simvastatin (ZOCOR) 20 MG tablet TAKE 1 TABLET BY MOUTH EVERY DAY (Patient taking differently: Take 20 mg by mouth daily at 6 PM.)  . traMADol (ULTRAM) 50 MG tablet TAKE 1 TABLET BY MOUTH EVERY 6 HOURS AS NEEDED FOR MODERATE OR SEVERE PAIN (Patient taking differently: Take 50 mg by mouth 2 (two) times daily. TAKE 1 TABLET BY MOUTH EVERY 6 HOURS AS NEEDED FOR MODERATE OR SEVERE PAIN)  . apixaban (ELIQUIS) 5 MG TABS tablet Take 1 tablet (5 mg total) by mouth 2 (two) times daily. (Patient not taking: Reported on 11/18/2020)   No facility-administered medications prior to visit.    Allergies  Allergen Reactions  . Amoxicillin Diarrhea  . Clindamycin/Lincomycin     diarrhea    Patient Care Team: [name], MD as PCP - General (Family Medicine) Marry Guan, Laurice Record, MD as Consulting Physician (Orthopedic Surgery) Lucky Cowboy Erskine Squibb, MD as Referring Physician (Vascular Surgery) Marlowe Sax, MD as Referring Physician (Internal Medicine) Neldon Labella, RN as Registered Nurse Jenita Seashore, Darla Lesches, LCSW as Social Worker Germaine Pomfret, [facility] (Pharmacist)  Review of Systems  Constitutional: Negative for chills, fatigue and fever.  HENT: Negative for congestion, ear pain, rhinorrhea, sinus pain and sore throat.   Respiratory: Negative for cough, shortness of breath and wheezing.   Cardiovascular: Negative for chest pain and leg swelling.  Gastrointestinal: Negative for abdominal pain, blood in stool, diarrhea, nausea and vomiting.  Genitourinary: Negative for dysuria, flank pain, frequency and urgency.  Neurological: Negative for dizziness and headaches.    Last CBC Lab Results  Component Value Date   WBC 7.8 10/10/2020   HGB 12.8 10/10/2020   HCT 36.5 10/10/2020   MCV 97 10/10/2020   MCH 33.9 (H) 10/10/2020   RDW 13.8 10/10/2020   PLT 179 32/95/1884   Last metabolic  panel Lab Results  Component Value Date   GLUCOSE 91 10/10/2020   NA 139 10/10/2020   K 3.6 10/10/2020   CL 97 10/10/2020   CO2 26 10/10/2020   BUN 11 10/10/2020   CREATININE 0.93 10/10/2020   GFRNONAA >60 09/20/2020   GFRAA 54 (L) 08/20/2020   CALCIUM 9.1 10/10/2020   PHOS 4.2 06/03/2020   PROT 6.6 08/20/2020   ALBUMIN 4.2 08/20/2020   LABGLOB 2.4 08/20/2020   AGRATIO 1.8 08/20/2020   BILITOT 0.7  08/20/2020   ALKPHOS 135 (H) 08/20/2020   AST 21 08/20/2020   ALT 20 08/20/2020   ANIONGAP 7 09/20/2020   Last lipids Lab Results  Component Value Date   CHOL 166 08/20/2020   HDL 56 08/20/2020   LDLCALC 86 08/20/2020   TRIG 141 08/20/2020   CHOLHDL 3.0 08/20/2020   Last hemoglobin A1c Lab Results  Component Value Date   HGBA1C 5.5 08/08/2019   Last thyroid functions Lab Results  Component Value Date   TSH 3.167 06/03/2020   Last vitamin B12 and Folate Lab Results  Component Value Date   VITAMINB12 186 06/03/2020   FOLATE 5.8 (L) 06/03/2020        Objective    Vitals: BP 96/65 (BP Location: Left Arm, Patient Position: Sitting, Cuff Size: Normal)   Pulse 84   Temp 98.1 F (36.7 C) (Oral)   Resp 16   Ht 5' (1.524 m)   Wt 127 lb 3.2 oz (57.7 kg)   SpO2 98%   BMI 24.84 kg/m  BP Readings from Last 3 Encounters:  11/18/20 96/65  10/10/20 112/67  09/20/20 127/68   Wt Readings from Last 3 Encounters:  11/18/20 127 lb 3.2 oz (57.7 kg)  10/10/20 126 lb (57.2 kg)  09/20/20 124 lb (56.2 kg)      Physical Exam Vitals reviewed.  Constitutional:      General: She is not in acute distress.    Appearance: Normal appearance. She is well-developed. She is not diaphoretic.  HENT:     Head: Normocephalic and atraumatic.     Right Ear: Tympanic membrane, ear canal and external ear normal.     Left Ear: Tympanic membrane, ear canal and external ear normal.     Nose: Nose normal.     Mouth/Throat:     Mouth: Mucous membranes are moist.     Pharynx: Oropharynx  is clear. No oropharyngeal exudate.  Eyes:     General: No scleral icterus.    Conjunctiva/sclera: Conjunctivae normal.     Pupils: Pupils are equal, round, and reactive to light.  Neck:     Thyroid: No thyromegaly.  Cardiovascular:     Rate and Rhythm: Normal rate and regular rhythm.     Pulses: Normal pulses.     Heart sounds: Normal heart sounds. No murmur heard.   Pulmonary:     Effort: Pulmonary effort is normal. No respiratory distress.     Breath sounds: Normal breath sounds. No wheezing or rales.  Abdominal:     General: There is no distension.     Palpations: Abdomen is soft.     Tenderness: There is no abdominal tenderness.  Musculoskeletal:        General: No deformity.     Cervical back: Neck supple.     Right lower leg: No edema.     Left lower leg: No edema.  Lymphadenopathy:     Cervical: No cervical adenopathy.  Skin:    General: Skin is warm and dry.     Findings: Rash (Psoriasis ) present.  Neurological:     Mental Status: She is alert and oriented to person, place, and time. Mental status is at baseline.     Sensory: No sensory deficit.     Motor: No weakness.     Gait: Gait normal.  Psychiatric:        Mood and Affect: Mood normal.        Behavior: Behavior normal.  Thought Content: Thought content normal.      Most recent functional status assessment: In your present state of health, do you have any difficulty performing the following activities: 11/18/2020  Hearing? Y  Vision? N  Difficulty concentrating or making decisions? N  Walking or climbing stairs? Y  Dressing or bathing? N  Doing errands, shopping? N  Some recent data might be hidden   Most recent fall risk assessment: Fall Risk  11/18/2020  Falls in the past year? 0  Comment -  Number falls in past yr: 0  Injury with Fall? 0  Risk for fall due to : No Fall Risks  Follow up Falls evaluation completed    Most recent depression screenings: PHQ 2/9 Scores 11/18/2020 10/31/2020   PHQ - 2 Score 0 0  PHQ- 9 Score 6 -   Most recent cognitive screening: 6CIT Screen 11/18/2020  What Year? 0 points  What month? 3 points  What time? 0 points  Count back from 20 0 points  Months in reverse 0 points  Repeat phrase 2 points  Total Score 5   Most recent Audit-C alcohol use screening Alcohol Use Disorder Test (AUDIT) 11/18/2020  1. How often do you have a drink containing alcohol? 2  2. How many drinks containing alcohol do you have on a typical day when you are drinking? 0  3. How often do you have six or more drinks on one occasion? 0  AUDIT-C Score 2  Alcohol Brief Interventions/Follow-up -   A score of 3 or more in women, and 4 or more in men indicates increased risk for alcohol abuse, EXCEPT if all of the points are from question 1   No results found for any visits on 11/18/20.  Assessment & Plan     Annual wellness visit done today including the all of the following: Reviewed patient's Family Medical History Reviewed and updated list of patient's medical providers Assessment of cognitive impairment was done Assessed patient's functional ability Established a written schedule for health screening [place] Completed and Reviewed  Exercise Activities and Dietary recommendations Goals    . Manage My Emotions     Timeframe:  Long-Range Goal Priority:  Medium Start Date:   10/31/20                          Expected End Date:   05/02/21                   Follow Up Date: 11/14/20   - talk about feelings with a friend, family or spiritual advisor - practice positive thinking and self-talk    Why is this important?    When you are stressed, down or upset, your body reacts too.   For example, your blood pressure may get higher; you may have a headache or stomachache.   When your emotions get the best of you, your body's ability to fight off cold and flu gets weak.   These steps will help you manage your emotions.     Notes:     .  Quit smoking / using tobacco     Recommend to quit smoking. Start by tapering down until completely quit.        Immunization History  Administered Date(s) Administered  . Fluad Quad(high Dose 65+) 04/04/2020  . Influenza, High Dose Seasonal PF 06/24/2015, 03/16/2016, 07/15/2017, 06/05/2018, 07/12/2019  . Influenza-Unspecified 06/01/2018  . PFIZER Comirnaty(Gray Top)Covid-19 Tri-Sucrose Vaccine  10/23/2020  . PFIZER(Purple Top)SARS-COV-2 Vaccination 07/30/2020, 08/20/2020  . Pneumococcal Conjugate-13 06/24/2015  . Pneumococcal Polysaccharide-23 03/26/2011  . Tdap 03/26/2011    Health Maintenance  Topic Date Due  . DEXA SCAN  08/08/2020  . INFLUENZA VACCINE  02/17/2021  . TETANUS/TDAP  03/25/2021  . COVID-19 Vaccine (3 - Booster for Pfizer series) 04/24/2021  . COLONOSCOPY (Pts 45-70yrs Insurance coverage will need to be confirmed)  08/10/2021  . Hepatitis C Screening  Completed  . PNA vac Low Risk Adult  Completed  . HPV VACCINES  Aged Out     Discussed health benefits of physical activity, and encouraged her to engage in regular exercise appropriate for her age and condition.    Problem List Items Addressed This Visit      Musculoskeletal and Integument   Age related osteoporosis   Relevant Orders   DG Bone Density    Other Visit Diagnoses    Encounter for annual wellness visit (AWV) in Medicare patient    -  Primary   Encounter for annual physical exam       Encounter for screening mammogram for malignant neoplasm of breast       Relevant Orders   MM 3D SCREEN BREAST BILATERAL       Return in about 6 months (around 05/21/2021) for chronic disease f/u.     Frederic Jericho Moorehead,acting as a Education administrator for Lavon Paganini, MD.,have documented all relevant documentation on the behalf of Lavon Paganini, MD,as directed by  Lavon Paganini, MD while in the presence of Lavon Paganini, MD.  I, Lavon Paganini, MD, have reviewed all documentation for this visit. The  documentation on 11/18/20 for the exam, diagnosis, procedures, and orders are all accurate and complete.   Najae Filsaime, Dionne Bucy, MD, MPH [facility]

## 2020-11-19 DIAGNOSIS — R410 Disorientation, unspecified: Secondary | ICD-10-CM | POA: Diagnosis not present

## 2020-11-19 DIAGNOSIS — Z86718 Personal history of other venous thrombosis and embolism: Secondary | ICD-10-CM | POA: Diagnosis not present

## 2020-11-19 DIAGNOSIS — R413 Other amnesia: Secondary | ICD-10-CM | POA: Diagnosis not present

## 2020-11-21 ENCOUNTER — Telehealth: Payer: Self-pay | Admitting: *Deleted

## 2020-11-21 NOTE — Telephone Encounter (Signed)
  Chronic Care Management   Outreach Note  11/21/2020 Name: Mariah [name] MRN: [id number] DOB: Feb 21, 1946  Referred by: [name], MD Reason for referral : No chief complaint on file.   A second unsuccessful telephone outreach was attempted today. The patient was referred to the case management team for assistance with care management and care coordination.   Follow Up Plan: Telephone follow up appointment with care management team member will be scheduled by care guide.    Elliot Gurney, [place] Worker  [facility] Care Management [phone]

## 2020-11-25 ENCOUNTER — Other Ambulatory Visit: Payer: Self-pay | Admitting: Family Medicine

## 2020-11-25 ENCOUNTER — Telehealth: Payer: Self-pay

## 2020-11-25 NOTE — Telephone Encounter (Signed)
   Notes to clinic:  medication was filled by a different provider  Review for continued use and refill    Requested Prescriptions  Pending Prescriptions Disp Refills   metoprolol succinate (TOPROL-XL) 50 MG 24 hr tablet [Pharmacy Med Name: METOPROLOL SUCC ER 50 MG TAB] 90 tablet 0    Sig: TAKE 1 TABLET BY MOUTH DAILY. TAKE WITH OR IMMEDIATELY FOLLOWING A MEAL.      Cardiovascular:  Beta Blockers Passed - 11/25/2020  9:30 AM      Passed - Last BP in normal range    BP Readings from Last 1 Encounters:  11/18/20 96/65          Passed - Last Heart Rate in normal range    Pulse Readings from Last 1 Encounters:  11/18/20 84          Passed - Valid encounter within last 6 months    Recent Outpatient Visits           1 week ago Encounter for annual wellness visit (AWV) in Medicare patient   [place], Dionne Bucy, MD   1 month ago Syncope, unspecified syncope type   [facility], Dionne Bucy, MD   3 months ago Essential (primary) hypertension   [facility], Dionne Bucy, MD   3 months ago Acute respiratory failure with hypoxia [facility])   [facility], Dionne Bucy, MD   5 months ago Chronic obstructive pulmonary disease, unspecified COPD type [facility])   Gamaliel Flinchum, Kelby Aline, FNP       Future Appointments             In 5 months Bacigalupo, Dionne Bucy, MD [facility], [place]

## 2020-11-25 NOTE — Telephone Encounter (Signed)
Copied from [place] [id number]. Topic: General - Other >> Nov 25, 2020  9:32 AM Leward Quan A wrote: Reason for CRM: Dorian Heckle from [facility] called in to inform Dr B that they will be out on 11/27/20 to begin patient PT. Any questions please call Ph# [phone] [phone]

## 2020-11-26 ENCOUNTER — Ambulatory Visit: Payer: PPO

## 2020-11-26 DIAGNOSIS — J449 Chronic obstructive pulmonary disease, unspecified: Secondary | ICD-10-CM

## 2020-11-26 NOTE — Telephone Encounter (Signed)
Noted  

## 2020-11-27 DIAGNOSIS — M1610 Unilateral primary osteoarthritis, unspecified hip: Secondary | ICD-10-CM | POA: Diagnosis not present

## 2020-11-27 DIAGNOSIS — N183 Chronic kidney disease, stage 3 unspecified: Secondary | ICD-10-CM | POA: Diagnosis not present

## 2020-11-27 DIAGNOSIS — Z96641 Presence of right artificial hip joint: Secondary | ICD-10-CM | POA: Diagnosis not present

## 2020-11-27 DIAGNOSIS — L4052 Psoriatic arthritis mutilans: Secondary | ICD-10-CM | POA: Diagnosis not present

## 2020-11-27 DIAGNOSIS — Z8701 Personal history of pneumonia (recurrent): Secondary | ICD-10-CM | POA: Diagnosis not present

## 2020-11-27 DIAGNOSIS — E7800 Pure hypercholesterolemia, unspecified: Secondary | ICD-10-CM | POA: Diagnosis not present

## 2020-11-27 DIAGNOSIS — Z86718 Personal history of other venous thrombosis and embolism: Secondary | ICD-10-CM | POA: Diagnosis not present

## 2020-11-27 DIAGNOSIS — J309 Allergic rhinitis, unspecified: Secondary | ICD-10-CM | POA: Diagnosis not present

## 2020-11-27 DIAGNOSIS — I130 Hypertensive heart and chronic kidney disease with heart failure and stage 1 through stage 4 chronic kidney disease, or unspecified chronic kidney disease: Secondary | ICD-10-CM | POA: Diagnosis not present

## 2020-11-27 DIAGNOSIS — J9611 Chronic respiratory failure with hypoxia: Secondary | ICD-10-CM | POA: Diagnosis not present

## 2020-11-27 DIAGNOSIS — M5116 Intervertebral disc disorders with radiculopathy, lumbar region: Secondary | ICD-10-CM | POA: Diagnosis not present

## 2020-11-27 DIAGNOSIS — K219 Gastro-esophageal reflux disease without esophagitis: Secondary | ICD-10-CM | POA: Diagnosis not present

## 2020-11-27 DIAGNOSIS — J441 Chronic obstructive pulmonary disease with (acute) exacerbation: Secondary | ICD-10-CM | POA: Diagnosis not present

## 2020-11-27 DIAGNOSIS — I714 Abdominal aortic aneurysm, without rupture: Secondary | ICD-10-CM | POA: Diagnosis not present

## 2020-11-27 DIAGNOSIS — F0150 Vascular dementia without behavioral disturbance: Secondary | ICD-10-CM | POA: Diagnosis not present

## 2020-11-27 DIAGNOSIS — G8929 Other chronic pain: Secondary | ICD-10-CM | POA: Diagnosis not present

## 2020-11-27 DIAGNOSIS — A09 Infectious gastroenteritis and colitis, unspecified: Secondary | ICD-10-CM | POA: Diagnosis not present

## 2020-11-27 DIAGNOSIS — E876 Hypokalemia: Secondary | ICD-10-CM | POA: Diagnosis not present

## 2020-11-27 DIAGNOSIS — I951 Orthostatic hypotension: Secondary | ICD-10-CM | POA: Diagnosis not present

## 2020-11-27 DIAGNOSIS — M810 Age-related osteoporosis without current pathological fracture: Secondary | ICD-10-CM | POA: Diagnosis not present

## 2020-11-27 DIAGNOSIS — I739 Peripheral vascular disease, unspecified: Secondary | ICD-10-CM | POA: Diagnosis not present

## 2020-11-27 DIAGNOSIS — F17210 Nicotine dependence, cigarettes, uncomplicated: Secondary | ICD-10-CM | POA: Diagnosis not present

## 2020-11-27 DIAGNOSIS — E440 Moderate protein-calorie malnutrition: Secondary | ICD-10-CM | POA: Diagnosis not present

## 2020-11-27 DIAGNOSIS — G2581 Restless legs syndrome: Secondary | ICD-10-CM | POA: Diagnosis not present

## 2020-11-27 DIAGNOSIS — Z7951 Long term (current) use of inhaled steroids: Secondary | ICD-10-CM | POA: Diagnosis not present

## 2020-11-27 DIAGNOSIS — I5042 Chronic combined systolic (congestive) and diastolic (congestive) heart failure: Secondary | ICD-10-CM | POA: Diagnosis not present

## 2020-11-27 DIAGNOSIS — D631 Anemia in chronic kidney disease: Secondary | ICD-10-CM | POA: Diagnosis not present

## 2020-11-28 ENCOUNTER — Other Ambulatory Visit: Payer: PPO

## 2020-11-28 ENCOUNTER — Telehealth: Payer: Self-pay | Admitting: Family Medicine

## 2020-11-28 NOTE — Telephone Encounter (Signed)
Verbal order has been given. KW °

## 2020-11-28 NOTE — Telephone Encounter (Signed)
Copied from [place] [id number]. Topic: Quick Communication - Home Health Verbal Orders >> Nov 28, 2020 11:48 AM Leward Quan A wrote: Caller/Agency: [place] Number: [phone] [place] [facility] Requesting OT/PT/Skilled Nursing/Social Work/Speech Therapy: PT  Frequency: 1 week  6

## 2020-11-28 NOTE — Telephone Encounter (Signed)
OK for verbals 

## 2020-12-02 NOTE — Chronic Care Management (AMB) (Signed)
  Chronic Care Management   Note   Name: MADHAVI HAMBLEN MRN: [id number] DOB: 04-Mar-1946    Brief outreach with Mrs. Ibsen's caregiver/niece Vicente Males. Tarrah is interested in the Chronic Care Management services for Mrs. Lenda Kelp but was busy at the time of the call. She is agreeable to outreach within the next two weeks. Agreed to call with urgent concerns if needed.   Follow up plan: Anticipate outreach within the next two weeks.   [name] Care Management [facility] [phone]

## 2020-12-03 ENCOUNTER — Ambulatory Visit (INDEPENDENT_AMBULATORY_CARE_PROVIDER_SITE_OTHER): Payer: PPO | Admitting: *Deleted

## 2020-12-03 DIAGNOSIS — J449 Chronic obstructive pulmonary disease, unspecified: Secondary | ICD-10-CM

## 2020-12-03 DIAGNOSIS — I10 Essential (primary) hypertension: Secondary | ICD-10-CM

## 2020-12-03 DIAGNOSIS — R413 Other amnesia: Secondary | ICD-10-CM

## 2020-12-04 ENCOUNTER — Telehealth: Payer: Self-pay

## 2020-12-04 DIAGNOSIS — I739 Peripheral vascular disease, unspecified: Secondary | ICD-10-CM | POA: Diagnosis not present

## 2020-12-04 DIAGNOSIS — F17210 Nicotine dependence, cigarettes, uncomplicated: Secondary | ICD-10-CM | POA: Diagnosis not present

## 2020-12-04 DIAGNOSIS — M5116 Intervertebral disc disorders with radiculopathy, lumbar region: Secondary | ICD-10-CM | POA: Diagnosis not present

## 2020-12-04 DIAGNOSIS — M1610 Unilateral primary osteoarthritis, unspecified hip: Secondary | ICD-10-CM | POA: Diagnosis not present

## 2020-12-04 DIAGNOSIS — Z86718 Personal history of other venous thrombosis and embolism: Secondary | ICD-10-CM | POA: Diagnosis not present

## 2020-12-04 DIAGNOSIS — Z8701 Personal history of pneumonia (recurrent): Secondary | ICD-10-CM | POA: Diagnosis not present

## 2020-12-04 DIAGNOSIS — I714 Abdominal aortic aneurysm, without rupture: Secondary | ICD-10-CM | POA: Diagnosis not present

## 2020-12-04 DIAGNOSIS — G8929 Other chronic pain: Secondary | ICD-10-CM | POA: Diagnosis not present

## 2020-12-04 DIAGNOSIS — L4052 Psoriatic arthritis mutilans: Secondary | ICD-10-CM | POA: Diagnosis not present

## 2020-12-04 DIAGNOSIS — J441 Chronic obstructive pulmonary disease with (acute) exacerbation: Secondary | ICD-10-CM | POA: Diagnosis not present

## 2020-12-04 DIAGNOSIS — I130 Hypertensive heart and chronic kidney disease with heart failure and stage 1 through stage 4 chronic kidney disease, or unspecified chronic kidney disease: Secondary | ICD-10-CM | POA: Diagnosis not present

## 2020-12-04 DIAGNOSIS — D631 Anemia in chronic kidney disease: Secondary | ICD-10-CM | POA: Diagnosis not present

## 2020-12-04 DIAGNOSIS — N183 Chronic kidney disease, stage 3 unspecified: Secondary | ICD-10-CM | POA: Diagnosis not present

## 2020-12-04 DIAGNOSIS — E440 Moderate protein-calorie malnutrition: Secondary | ICD-10-CM | POA: Diagnosis not present

## 2020-12-04 DIAGNOSIS — Z7951 Long term (current) use of inhaled steroids: Secondary | ICD-10-CM | POA: Diagnosis not present

## 2020-12-04 DIAGNOSIS — I5042 Chronic combined systolic (congestive) and diastolic (congestive) heart failure: Secondary | ICD-10-CM | POA: Diagnosis not present

## 2020-12-04 DIAGNOSIS — K219 Gastro-esophageal reflux disease without esophagitis: Secondary | ICD-10-CM | POA: Diagnosis not present

## 2020-12-04 DIAGNOSIS — Z96641 Presence of right artificial hip joint: Secondary | ICD-10-CM | POA: Diagnosis not present

## 2020-12-04 DIAGNOSIS — J309 Allergic rhinitis, unspecified: Secondary | ICD-10-CM | POA: Diagnosis not present

## 2020-12-04 DIAGNOSIS — G2581 Restless legs syndrome: Secondary | ICD-10-CM | POA: Diagnosis not present

## 2020-12-04 DIAGNOSIS — J9611 Chronic respiratory failure with hypoxia: Secondary | ICD-10-CM | POA: Diagnosis not present

## 2020-12-04 DIAGNOSIS — M810 Age-related osteoporosis without current pathological fracture: Secondary | ICD-10-CM | POA: Diagnosis not present

## 2020-12-04 DIAGNOSIS — F0150 Vascular dementia without behavioral disturbance: Secondary | ICD-10-CM | POA: Diagnosis not present

## 2020-12-04 DIAGNOSIS — I951 Orthostatic hypotension: Secondary | ICD-10-CM | POA: Diagnosis not present

## 2020-12-04 DIAGNOSIS — E7800 Pure hypercholesterolemia, unspecified: Secondary | ICD-10-CM | POA: Diagnosis not present

## 2020-12-04 NOTE — Patient Instructions (Signed)
Visit Information  Goals Addressed            This Visit's Progress   . Manage My Emotions       Timeframe:  Long-Range Goal Priority:  Medium Start Date:   10/31/20                          Expected End Date:   05/02/21                   Follow Up Date: 12/17/20    -follow up on referral to [facility]   Why is this important?    When you are stressed, down or upset, your body reacts too.   For example, your blood pressure may get higher; you may have a headache or stomachache.   When your emotions get the best of you, your body's ability to fight off cold and flu gets weak.   These steps will help you manage your emotions.     Notes:        The patient verbalized understanding of instructions, educational materials, and care plan provided today and declined offer to receive copy of patient instructions, educational materials, and care plan.   Telephone follow up appointment with care management team member scheduled for: 12/17/20    Elliot Gurney, [place] Worker  [facility] Care Management [phone]

## 2020-12-04 NOTE — Chronic Care Management (AMB) (Signed)
Chronic Care Management    Clinical Social Work Note  12/04/2020 Name: Mariah [name] MRN: [id number] DOB: 15-Feb-1946  Mariah [name] is a 75 y.o. year old female who is a primary care patient of Bacigalupo, Dionne Bucy, MD. The CCM team was consulted to assist the patient with chronic disease management and/or care coordination needs related to: [employer] .   Collaboration with patient's niece for follow up visit in response to provider referral for social work chronic care management and care coordination services.   Consent to Services:  The patient was given information about Chronic Care Management services, agreed to services, and gave verbal consent prior to initiation of services.  Please see initial visit note for detailed documentation.   Patient agreed to services and consent obtained.   Assessment: Review of patient past medical history, allergies, medications, and health status, including review of relevant consultants reports was performed today as part of a comprehensive evaluation and provision of chronic care management and care coordination services.     SDOH (Social Determinants of Health) assessments and interventions performed:    Advanced Directives Status: Not addressed in this encounter.  CCM Care Plan  Allergies  Allergen Reactions  . Amoxicillin Diarrhea  . Clindamycin/Lincomycin     diarrhea    Outpatient Encounter Medications as of 12/03/2020  Medication Sig Note  . albuterol (VENTOLIN HFA) 108 (90 Base) MCG/ACT inhaler INHALE 1-2 PUFFS INTO THE LUNGS EVERY 6 HOURS AS NEEDED FOR WHEEZING OR SHORTNESS OF BREATH.   [name] apixaban (ELIQUIS) 5 MG TABS tablet Take 1 tablet (5 mg total) by mouth 2 (two) times daily. (Patient not taking: Reported on 11/18/2020)   . betamethasone dipropionate (DIPROLENE) 0.05 % ointment Apply topically daily.   [name] BREO ELLIPTA 100-25 MCG/INH AEPB TAKE 1 PUFF BY MOUTH EVERY DAY   . calcipotriene (DOVONOX) 0.005 % cream APPLY TO SKIN  ONCE A DAY APPLY DAILY ON HANDS, FEET AND WRIST X 2 WEEKS, THEN USE 5 DAYS A WEEK   . Calcipotriene 0.005 % solution APPLY 1 A SMALL AMOUNT TO SCALP ONCE A DAY APPLY TO SCALP DAILY FOR 2 WEEKS, THEN USE ON SCALP 5 DAYS A WEEK   . Cholecalciferol (VITAMIN D3) 2000 units TABS Take 2,000 Units by mouth daily.   . clobetasol (TEMOVATE) 0.05 % external solution APPLY TO AFFECTED AREA TWICE A DAY   . COSENTYX SENSOREADY, 300 MG, 150 MG/ML SOAJ Inject 2 Syringes into the skin every 28 (twenty-eight) days.   [name] donepezil (ARICEPT) 10 MG tablet Take 10 mg by mouth daily.   . ferrous sulfate 325 (65 FE) MG tablet Take 325 mg by mouth 2 (two) times daily.   . folic acid (FOLVITE) 1 MG tablet Take 1 mg by mouth daily.   . furosemide (LASIX) 40 MG tablet Take 1 tablet (40 mg total) by mouth daily. If weight 127 lb or less, take 20 mg by mouth daily.   [name] gabapentin (NEURONTIN) 300 MG capsule Take 1 capsule (300 mg total) by mouth 2 (two) times daily.   [name] levocetirizine (XYZAL) 5 MG tablet TAKE 1 TABLET BY MOUTH EVERY DAY IN THE EVENING (Patient taking differently: Take 5 mg by mouth every evening.)   . methotrexate (RHEUMATREX) 2.5 MG tablet Take 15 mg by mouth once a week.  09/17/2020: Monday   . metoprolol succinate (TOPROL-XL) 50 MG 24 hr tablet TAKE 1 TABLET BY MOUTH DAILY. TAKE WITH OR IMMEDIATELY FOLLOWING A MEAL.   . pantoprazole (PROTONIX)  20 MG tablet TAKE 1 TABLET (20 MG TOTAL) BY MOUTH 2 (TWO) TIMES DAILY BEFORE A MEAL.   [name] potassium chloride SA (KLOR-CON) 20 MEQ tablet Take 2 tablets (40 mEq total) by mouth daily.   [name] rOPINIRole (REQUIP) 1 MG tablet TAKE 1 TABLET (1 MG TOTAL) BY MOUTH AT BEDTIME.   . simvastatin (ZOCOR) 20 MG tablet TAKE 1 TABLET BY MOUTH EVERY DAY (Patient taking differently: Take 20 mg by mouth daily at 6 PM.)   . traMADol (ULTRAM) 50 MG tablet TAKE 1 TABLET BY MOUTH EVERY 6 HOURS AS NEEDED FOR MODERATE OR SEVERE PAIN (Patient taking differently: Take 50 mg by mouth 2 (two) times  daily. TAKE 1 TABLET BY MOUTH EVERY 6 HOURS AS NEEDED FOR MODERATE OR SEVERE PAIN)    No facility-administered encounter medications on file as of 12/03/2020.    Patient Active Problem List   Diagnosis Date Noted  . Hypokalemia 09/19/2020  . Orthostatic hypotension 09/18/2020  . Syncope 09/17/2020  . History of DVT (deep vein thrombosis) 07/09/2020  . Multifocal atrial tachycardia ([address]) 07/09/2020  . Congestive heart failure ([place]) 06/18/2020  . Weakness   . Protein-calorie malnutrition, severe 06/05/2020  . Hypoalbuminemia 06/03/2020  . Cutaneous horn 06/03/2020  . Pleural effusion 06/03/2020  . Chronic diastolic CHF (congestive heart failure) ([place]) 06/03/2020  . Acute respiratory failure with hypoxia ([place]) 06/02/2020  . Irregular heartbeat 05/30/2020  . Bilateral edema of lower extremity 05/30/2020  . Blood transfusion reaction 05/30/2020  . CKD (chronic kidney disease), stage III ([place]) 05/26/2020  . Acute deep vein thrombosis (DVT) of tibial vein of left lower extremity ([place]) 04/05/2020  . Mental confusion 04/01/2020  . Unintentional weight loss 03/01/2020  . Memory loss 03/01/2020  . Allergic rhinitis 01/10/2018  . Cholelithiasis 03/16/2016  . Peripheral arterial disease ([place]) 02/05/2016  . 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 Abdominal aortic aneurysm (AAA) ([place]) 01/13/2016  . 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 Nerve root inflammation 10/25/2015  . Psoriatic arthritis ([place]) 10/07/2015  . 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 Right hip pain 08/12/2015  . Arthralgia of multiple joints 08/12/2015  . Age related osteoporosis 08/12/2015  . COPD with acute exacerbation ([place]) 06/24/2015  . Senile purpura ([place]) 06/24/2015  . Abnormal serum level of alkaline phosphatase 06/18/2015  . Lethargy 06/18/2015  . Gastro-esophageal reflux disease without esophagitis 06/18/2015  . Restless leg 06/18/2015  . Avitaminosis D 06/18/2015  . Mechanical and motor problems with internal  organs 10/11/2009  . Hypercholesteremia 10/11/2009  . Arthritis, degenerative 09/25/2009  . Essential (primary) hypertension 09/25/2009  . Psoriasis 09/25/2009  . Compulsive tobacco user syndrome 09/25/2009    Conditions to be addressed/monitored: Dementia; Lacks knowledge of community resource: social day programs  Care Plan : Dementia (Adult)  Updates made by Vern Claude, LCSW since 12/04/2020 12:00 AM    Problem: Psychosocial Adjustment to Dementia     Goal: Optimal Coping   Start Date: 10/31/2020  Recent Progress: On track  Priority: Medium  Note:   Current Barriers:  . Chronic Mental Health needs related to adjustment to memory challenges and loss of independence . Memory Deficits . Suicidal Ideation/Homicidal Ideation: No  Clinical Social Work Goal(s):  . patient will work with SW bi-weekly by telephone or in person  . patient will work with SW to address concerns related to loss if independence  Interventions: . 1:1 collaboration with Brita Romp, Dionne Bucy, MD regarding development and update of comprehensive plan of care as evidenced by  provider attestation and co-signature . Attempt made to reach patient today, however no answer-was able to reach patient's niece  . Patient's niece confirmed that patient now resides with her due to memory concerns and falls- she handles patient's medications, meals and transportation to appointments- patient's brother in law is her POA and handles patient's finances and medical decisions when needed . Patient's niece discussed concerned regarding patient's socialization and requested resources-[facility] discussed as an option . Patient continues to be active with PT/OT  . Collaboration with [facility]-Director agrees to contact patient's niece to discuss the program further-contact to the program provided to patient's niece . Positive reinforcement and reinforcement provided  Patient Self Care  Activities:  . Performs ADL's independently . Strong family or social support  Patient Coping Strengths:  . Supportive Relationships . Family . Friends . Able to Communicate Effectively  Patient Self Care Deficits:  . Unable to perform IADLs independently  Patient Goals:  - talk about feelings with a friend, family or spiritual advisor - practice positive thinking and self-talk - keep a calendar with appointment dates      Follow Up Plan: SW will follow up with patient by phone over the next 14 business days       [place], [place] Worker  [facility] [phone]

## 2020-12-04 NOTE — Telephone Encounter (Signed)
Copied from [place] [id number]. Topic: Quick Communication - Home Health Verbal Orders >> Dec 04, 2020  4:27 PM Pawl[place], [place] wrote: Caller/Agency: Katina Degree Number: [phone] Requesting: OT Frequency: 1x6

## 2020-12-05 NOTE — Telephone Encounter (Signed)
OK for verbals 

## 2020-12-05 NOTE — Telephone Encounter (Signed)
lmtcb okay for [facility] triage to advise if message below. KW

## 2020-12-11 ENCOUNTER — Telehealth: Payer: Self-pay | Admitting: Family Medicine

## 2020-12-11 ENCOUNTER — Telehealth: Payer: Self-pay

## 2020-12-11 NOTE — Telephone Encounter (Signed)
Copied from [place] [id number]. Topic: General - Other >> Dec 11, 2020  2:40 PM Loma [place] wrote: Reason for FFM:[id number] Home/ Therapist calling, pt missed appt today due to being out of town.[phone]

## 2020-12-11 NOTE — Telephone Encounter (Signed)
Called to inform the doctor of missed visit on today  Would also like to request another visit next week for OT.  Please advise and call to confirm at [phone]

## 2020-12-12 DIAGNOSIS — Z973 Presence of spectacles and contact lenses: Secondary | ICD-10-CM | POA: Diagnosis not present

## 2020-12-12 DIAGNOSIS — H16012 Central corneal ulcer, left eye: Secondary | ICD-10-CM | POA: Diagnosis not present

## 2020-12-12 DIAGNOSIS — H20052 Hypopyon, left eye: Secondary | ICD-10-CM | POA: Diagnosis not present

## 2020-12-12 DIAGNOSIS — H168 Other keratitis: Secondary | ICD-10-CM | POA: Diagnosis not present

## 2020-12-12 DIAGNOSIS — F17210 Nicotine dependence, cigarettes, uncomplicated: Secondary | ICD-10-CM | POA: Diagnosis not present

## 2020-12-12 DIAGNOSIS — H109 Unspecified conjunctivitis: Secondary | ICD-10-CM | POA: Diagnosis not present

## 2020-12-12 DIAGNOSIS — H16002 Unspecified corneal ulcer, left eye: Secondary | ICD-10-CM | POA: Diagnosis not present

## 2020-12-12 DIAGNOSIS — H1820 Unspecified corneal edema: Secondary | ICD-10-CM | POA: Diagnosis not present

## 2020-12-12 DIAGNOSIS — H16223 Keratoconjunctivitis sicca, not specified as Sjogren's, bilateral: Secondary | ICD-10-CM | POA: Diagnosis not present

## 2020-12-12 NOTE — Telephone Encounter (Signed)
Left detailed message with verbal order. KW

## 2020-12-12 NOTE — Telephone Encounter (Signed)
OK for verbals 

## 2020-12-12 NOTE — Telephone Encounter (Signed)
Noted  

## 2020-12-13 DIAGNOSIS — H2513 Age-related nuclear cataract, bilateral: Secondary | ICD-10-CM | POA: Diagnosis not present

## 2020-12-13 DIAGNOSIS — H16032 Corneal ulcer with hypopyon, left eye: Secondary | ICD-10-CM | POA: Diagnosis not present

## 2020-12-17 ENCOUNTER — Other Ambulatory Visit: Payer: Self-pay | Admitting: Family Medicine

## 2020-12-17 ENCOUNTER — Ambulatory Visit: Payer: Self-pay | Admitting: *Deleted

## 2020-12-17 DIAGNOSIS — I10 Essential (primary) hypertension: Secondary | ICD-10-CM | POA: Diagnosis not present

## 2020-12-17 DIAGNOSIS — M1990 Unspecified osteoarthritis, unspecified site: Secondary | ICD-10-CM

## 2020-12-17 DIAGNOSIS — J449 Chronic obstructive pulmonary disease, unspecified: Secondary | ICD-10-CM | POA: Diagnosis not present

## 2020-12-17 DIAGNOSIS — R413 Other amnesia: Secondary | ICD-10-CM

## 2020-12-17 NOTE — Telephone Encounter (Signed)
   Notes to clinic:  Patient has appointment today    Requested Prescriptions  Pending Prescriptions Disp Refills   traMADol (ULTRAM) 50 MG tablet [Pharmacy Med Name: TRAMADOL HCL 50 MG TABLET] 100 tablet 2    Sig: TAKE 1 TABLET BY MOUTH EVERY 6 HOURS AS NEEDED FOR MODERATE OR SEVERE PAIN      Not Delegated - Analgesics:  Opioid Agonists Failed - 12/17/2020  9:06 AM      Failed - This refill cannot be delegated      Failed - Urine Drug Screen completed in last 360 days      Passed - Valid encounter within last 6 months    Recent Outpatient Visits           4 weeks ago Encounter for annual wellness visit (AWV) in Medicare patient   [facility], Dionne Bucy, MD   2 months ago Syncope, unspecified syncope type   [facility], Dionne Bucy, MD   3 months ago Essential (primary) hypertension   [facility], Dionne Bucy, MD   4 months ago Acute respiratory failure with hypoxia [facility])   [facility], Dionne Bucy, MD   5 months ago Chronic obstructive pulmonary disease, unspecified COPD type [facility])   [place], Kelby Aline, FNP       Future Appointments             In 5 months Bacigalupo, Dionne Bucy, MD [facility]               levocetirizine (XYZAL) 5 MG tablet [Pharmacy Med Name: LEVOCETIRIZINE 5 MG TABLET] 90 tablet 4    Sig: TAKE 1 TABLET BY MOUTH EVERY DAY IN THE EVENING      Ear, Nose, and Throat:  Antihistamines Passed - 12/17/2020  9:06 AM      Passed - Valid encounter within last 12 months    Recent Outpatient Visits           4 weeks ago Encounter for annual wellness visit (AWV) in Medicare patient   [facility] [place], Dionne Bucy, MD   2 months ago Syncope, unspecified syncope type   [facility], Dionne Bucy, MD   3 months ago Essential (primary) hypertension   [facility]  [place], Dionne Bucy, MD   4 months ago Acute respiratory failure with hypoxia [facility])   [facility], Dionne Bucy, MD   5 months ago Chronic obstructive pulmonary disease, unspecified COPD type [facility])   [place], Kelby Aline, FNP       Future Appointments             In 5 months Bacigalupo, Dionne Bucy, MD [facility] [place]

## 2020-12-18 DIAGNOSIS — H16032 Corneal ulcer with hypopyon, left eye: Secondary | ICD-10-CM | POA: Diagnosis not present

## 2020-12-18 NOTE — Patient Instructions (Signed)
Visit Information  Goals Addressed            This Visit's Progress   . Manage My Emotions       Timeframe:  Long-Range Goal Priority:  Medium Start Date:   10/31/20                          Expected End Date:   05/02/21                   Follow Up Date: 12/31/20    -follow up on referral to [facility]   Why is this important?    When you are stressed, down or upset, your body reacts too.   For example, your blood pressure may get higher; you may have a headache or stomachache.   When your emotions get the best of you, your body's ability to fight off cold and flu gets weak.   These steps will help you manage your emotions.     Notes:        The patient verbalized understanding of instructions, educational materials, and care plan provided today and declined offer to receive copy of patient instructions, educational materials, and care plan.   Telephone follow up appointment with care management team member scheduled for: 12/24/20    Elliot Gurney, [place] Worker  [facility] [phone]

## 2020-12-18 NOTE — Chronic Care Management (AMB) (Signed)
Chronic Care Management    Clinical Social Work Note  12/18/2020 Name: Mariah [name] MRN: [id number] DOB: 10-29-1945  Mariah [name] is a 75 y.o. year old female who is a primary care patient of Bacigalupo, Dionne Bucy, MD. The CCM team was consulted to assist the patient with chronic disease management and/or care coordination needs related to: [employer] .   Engaged with patient's niece by telephone for follow up visit in response to provider referral for social work chronic care management and care coordination services.   Consent to Services:  The patient was given information about Chronic Care Management services, agreed to services, and gave verbal consent prior to initiation of services.  Please see initial visit note for detailed documentation.   Patient agreed to services and consent obtained.   Assessment: Review of patient past medical history, allergies, medications, and health status, including review of relevant consultants reports was performed today as part of a comprehensive evaluation and provision of chronic care management and care coordination services.     SDOH (Social Determinants of Health) assessments and interventions performed:    Advanced Directives Status: Not addressed in this encounter.  CCM Care Plan  Allergies  Allergen Reactions  . Amoxicillin Diarrhea  . Clindamycin/Lincomycin     diarrhea    Outpatient Encounter Medications as of 12/17/2020  Medication Sig Note  . albuterol (VENTOLIN HFA) 108 (90 Base) MCG/ACT inhaler INHALE 1-2 PUFFS INTO THE LUNGS EVERY 6 HOURS AS NEEDED FOR WHEEZING OR SHORTNESS OF BREATH.   [name] apixaban (ELIQUIS) 5 MG TABS tablet Take 1 tablet (5 mg total) by mouth 2 (two) times daily. (Patient not taking: Reported on 11/18/2020)   . betamethasone dipropionate (DIPROLENE) 0.05 % ointment Apply topically daily.   [name] BREO ELLIPTA 100-25 MCG/INH AEPB TAKE 1 PUFF BY MOUTH EVERY DAY   . calcipotriene (DOVONOX) 0.005 % cream APPLY  TO SKIN ONCE A DAY APPLY DAILY ON HANDS, FEET AND WRIST X 2 WEEKS, THEN USE 5 DAYS A WEEK   . Calcipotriene 0.005 % solution APPLY 1 A SMALL AMOUNT TO SCALP ONCE A DAY APPLY TO SCALP DAILY FOR 2 WEEKS, THEN USE ON SCALP 5 DAYS A WEEK   . Cholecalciferol (VITAMIN D3) 2000 units TABS Take 2,000 Units by mouth daily.   . clobetasol (TEMOVATE) 0.05 % external solution APPLY TO AFFECTED AREA TWICE A DAY   . COSENTYX SENSOREADY, 300 MG, 150 MG/ML SOAJ Inject 2 Syringes into the skin every 28 (twenty-eight) days.   [name] donepezil (ARICEPT) 10 MG tablet Take 10 mg by mouth daily.   . ferrous sulfate 325 (65 FE) MG tablet Take 325 mg by mouth 2 (two) times daily.   . folic acid (FOLVITE) 1 MG tablet Take 1 mg by mouth daily.   . furosemide (LASIX) 40 MG tablet Take 1 tablet (40 mg total) by mouth daily. If weight 127 lb or less, take 20 mg by mouth daily.   [name] gabapentin (NEURONTIN) 300 MG capsule Take 1 capsule (300 mg total) by mouth 2 (two) times daily.   [name] levocetirizine (XYZAL) 5 MG tablet TAKE 1 TABLET BY MOUTH EVERY DAY IN THE EVENING (Patient taking differently: Take 5 mg by mouth every evening.)   . methotrexate (RHEUMATREX) 2.5 MG tablet Take 15 mg by mouth once a week.  09/17/2020: Monday   . metoprolol succinate (TOPROL-XL) 50 MG 24 hr tablet TAKE 1 TABLET BY MOUTH DAILY. TAKE WITH OR IMMEDIATELY FOLLOWING A MEAL.   [name]  pantoprazole (PROTONIX) 20 MG tablet TAKE 1 TABLET (20 MG TOTAL) BY MOUTH 2 (TWO) TIMES DAILY BEFORE A MEAL.   [name] potassium chloride SA (KLOR-CON) 20 MEQ tablet Take 2 tablets (40 mEq total) by mouth daily.   [name] rOPINIRole (REQUIP) 1 MG tablet TAKE 1 TABLET (1 MG TOTAL) BY MOUTH AT BEDTIME.   . simvastatin (ZOCOR) 20 MG tablet TAKE 1 TABLET BY MOUTH EVERY DAY (Patient taking differently: Take 20 mg by mouth daily at 6 PM.)   . traMADol (ULTRAM) 50 MG tablet TAKE 1 TABLET BY MOUTH EVERY 6 HOURS AS NEEDED FOR MODERATE OR SEVERE PAIN (Patient taking differently: Take 50 mg by mouth 2 (two)  times daily. TAKE 1 TABLET BY MOUTH EVERY 6 HOURS AS NEEDED FOR MODERATE OR SEVERE PAIN)    No facility-administered encounter medications on file as of 12/17/2020.    Patient Active Problem List   Diagnosis Date Noted  . Hypokalemia 09/19/2020  . Orthostatic hypotension 09/18/2020  . Syncope 09/17/2020  . History of DVT (deep vein thrombosis) 07/09/2020  . Multifocal atrial tachycardia ([place]) 07/09/2020  . Congestive heart failure ([place]) 06/18/2020  . Weakness   . Protein-calorie malnutrition, severe 06/05/2020  . Hypoalbuminemia 06/03/2020  . Cutaneous horn 06/03/2020  . Pleural effusion 06/03/2020  . Chronic diastolic CHF (congestive heart failure) ([place]) 06/03/2020  . Acute respiratory failure with hypoxia ([place]) 06/02/2020  . Irregular heartbeat 05/30/2020  . Bilateral edema of lower extremity 05/30/2020  . Blood transfusion reaction 05/30/2020  . CKD (chronic kidney disease), stage III ([place]) 05/26/2020  . Acute deep vein thrombosis (DVT) of tibial vein of left lower extremity ([place]) 04/05/2020  . Mental confusion 04/01/2020  . Unintentional weight loss 03/01/2020  . Memory loss 03/01/2020  . Allergic rhinitis 01/10/2018  . Cholelithiasis 03/16/2016  . Peripheral arterial disease ([place]) 02/05/2016  . 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 Abdominal aortic aneurysm (AAA) ([place]) 01/13/2016  . 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 Nerve root inflammation 10/25/2015  . Psoriatic arthritis ([place]) 10/07/2015  . 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 Right hip pain 08/12/2015  . Arthralgia of multiple joints 08/12/2015  . Age related osteoporosis 08/12/2015  . COPD with acute exacerbation ([place]) 06/24/2015  . Senile purpura ([place]) 06/24/2015  . Abnormal serum level of alkaline phosphatase 06/18/2015  . Lethargy 06/18/2015  . Gastro-esophageal reflux disease without esophagitis 06/18/2015  . Restless leg 06/18/2015  . Avitaminosis D 06/18/2015  . Mechanical and motor problems with  internal organs 10/11/2009  . Hypercholesteremia 10/11/2009  . Arthritis, degenerative 09/25/2009  . Essential (primary) hypertension 09/25/2009  . Psoriasis 09/25/2009  . Compulsive tobacco user syndrome 09/25/2009    Conditions to be addressed/monitored: Dementia; Memory Deficits  Care Plan : Dementia (Adult)  Updates made by Vern Claude, LCSW since 12/18/2020 12:00 AM    Problem: Psychosocial Adjustment to Dementia     Goal: Optimal Coping   Start Date: 10/31/2020  Recent Progress: On track  Priority: Medium  Note:   Current Barriers:  . Chronic Mental Health needs related to adjustment to memory challenges and loss of independence . Memory Deficits . Suicidal Ideation/Homicidal Ideation: No  Clinical Social Work Goal(s):  . patient will work with SW bi-weekly by telephone or in person  . patient will work with SW to address concerns related to loss if independence  Interventions: . 1:1 collaboration with Brita Romp, Dionne Bucy, MD regarding development and update of comprehensive plan of care as evidenced by provider attestation and co-signature .  Attempt made to reach patient today, however no answer-was able to reach patient's niece  . 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 Patient's niece confirmed follow up with the [facility] however patient has refused participation-cannot smoke on grounds . Patient continues to be active with PT/OT  . Emotional support provided to niece regarding patient's condition, alternatives explored to increase patient's socialization   Patient Self Care Activities:  . Performs ADL's independently . Strong family or social support  Patient Coping Strengths:  . Supportive Relationships . Family . Friends . Able to Communicate Effectively  Patient Self Care  Deficits:  . Unable to perform IADLs independently  Patient Goals:  - talk about feelings with a friend, family or spiritual advisor - practice positive thinking and self-talk - keep a calendar with appointment dates      Follow Up Plan: SW will follow up with patient by phone over the next 14 business days       [place], [place] Worker  [facility] [phone]

## 2020-12-20 DIAGNOSIS — I951 Orthostatic hypotension: Secondary | ICD-10-CM | POA: Diagnosis not present

## 2020-12-20 DIAGNOSIS — Z86718 Personal history of other venous thrombosis and embolism: Secondary | ICD-10-CM | POA: Diagnosis not present

## 2020-12-20 DIAGNOSIS — D631 Anemia in chronic kidney disease: Secondary | ICD-10-CM | POA: Diagnosis not present

## 2020-12-20 DIAGNOSIS — J9611 Chronic respiratory failure with hypoxia: Secondary | ICD-10-CM | POA: Diagnosis not present

## 2020-12-20 DIAGNOSIS — F17210 Nicotine dependence, cigarettes, uncomplicated: Secondary | ICD-10-CM | POA: Diagnosis not present

## 2020-12-20 DIAGNOSIS — E7800 Pure hypercholesterolemia, unspecified: Secondary | ICD-10-CM | POA: Diagnosis not present

## 2020-12-20 DIAGNOSIS — N183 Chronic kidney disease, stage 3 unspecified: Secondary | ICD-10-CM | POA: Diagnosis not present

## 2020-12-20 DIAGNOSIS — E440 Moderate protein-calorie malnutrition: Secondary | ICD-10-CM | POA: Diagnosis not present

## 2020-12-20 DIAGNOSIS — I714 Abdominal aortic aneurysm, without rupture: Secondary | ICD-10-CM | POA: Diagnosis not present

## 2020-12-20 DIAGNOSIS — F0150 Vascular dementia without behavioral disturbance: Secondary | ICD-10-CM | POA: Diagnosis not present

## 2020-12-20 DIAGNOSIS — K219 Gastro-esophageal reflux disease without esophagitis: Secondary | ICD-10-CM | POA: Diagnosis not present

## 2020-12-20 DIAGNOSIS — J441 Chronic obstructive pulmonary disease with (acute) exacerbation: Secondary | ICD-10-CM | POA: Diagnosis not present

## 2020-12-20 DIAGNOSIS — I130 Hypertensive heart and chronic kidney disease with heart failure and stage 1 through stage 4 chronic kidney disease, or unspecified chronic kidney disease: Secondary | ICD-10-CM | POA: Diagnosis not present

## 2020-12-20 DIAGNOSIS — L4052 Psoriatic arthritis mutilans: Secondary | ICD-10-CM | POA: Diagnosis not present

## 2020-12-20 DIAGNOSIS — Z7951 Long term (current) use of inhaled steroids: Secondary | ICD-10-CM | POA: Diagnosis not present

## 2020-12-20 DIAGNOSIS — I739 Peripheral vascular disease, unspecified: Secondary | ICD-10-CM | POA: Diagnosis not present

## 2020-12-20 DIAGNOSIS — Z8701 Personal history of pneumonia (recurrent): Secondary | ICD-10-CM | POA: Diagnosis not present

## 2020-12-20 DIAGNOSIS — M810 Age-related osteoporosis without current pathological fracture: Secondary | ICD-10-CM | POA: Diagnosis not present

## 2020-12-20 DIAGNOSIS — J309 Allergic rhinitis, unspecified: Secondary | ICD-10-CM | POA: Diagnosis not present

## 2020-12-20 DIAGNOSIS — M5116 Intervertebral disc disorders with radiculopathy, lumbar region: Secondary | ICD-10-CM | POA: Diagnosis not present

## 2020-12-20 DIAGNOSIS — M1610 Unilateral primary osteoarthritis, unspecified hip: Secondary | ICD-10-CM | POA: Diagnosis not present

## 2020-12-20 DIAGNOSIS — G2581 Restless legs syndrome: Secondary | ICD-10-CM | POA: Diagnosis not present

## 2020-12-20 DIAGNOSIS — Z96641 Presence of right artificial hip joint: Secondary | ICD-10-CM | POA: Diagnosis not present

## 2020-12-20 DIAGNOSIS — G8929 Other chronic pain: Secondary | ICD-10-CM | POA: Diagnosis not present

## 2020-12-20 DIAGNOSIS — I5042 Chronic combined systolic (congestive) and diastolic (congestive) heart failure: Secondary | ICD-10-CM | POA: Diagnosis not present

## 2020-12-23 ENCOUNTER — Telehealth: Payer: Self-pay

## 2020-12-23 NOTE — Telephone Encounter (Signed)
Copied from [place] [id number]. Topic: General - Inquiry >> Dec 23, 2020  2:23 PM Valere Dross wrote: Reason for CRM: Pts niece called in about pt having dementia and that it is getting worse. Pts niece requesting for PCP to give her a call about the next step.

## 2020-12-24 ENCOUNTER — Ambulatory Visit (INDEPENDENT_AMBULATORY_CARE_PROVIDER_SITE_OTHER): Payer: PPO | Admitting: *Deleted

## 2020-12-24 ENCOUNTER — Telehealth: Payer: Self-pay | Admitting: Family Medicine

## 2020-12-24 DIAGNOSIS — I10 Essential (primary) hypertension: Secondary | ICD-10-CM | POA: Diagnosis not present

## 2020-12-24 DIAGNOSIS — D692 Other nonthrombocytopenic purpura: Secondary | ICD-10-CM

## 2020-12-24 DIAGNOSIS — J449 Chronic obstructive pulmonary disease, unspecified: Secondary | ICD-10-CM | POA: Diagnosis not present

## 2020-12-24 DIAGNOSIS — R413 Other amnesia: Secondary | ICD-10-CM

## 2020-12-24 NOTE — Telephone Encounter (Signed)
Patient's caregiver is requesting an order for a nursing aide for patient to help with care. Please advise and call to discuss at [phone]

## 2020-12-24 NOTE — Telephone Encounter (Signed)
[name] advised to call neurology for advice on worsening dementia. Mariah [name] does want to talk to Dr. B about possibly getting [name] in assisted living or home health nurse. She is aware that Dr. B is out and will return on 01/06/21.

## 2020-12-24 NOTE — Telephone Encounter (Signed)
Looks like she was referred to Dr. Melrose Nakayama for dementia. Dr B is out of town so recommend they contact neurologist.

## 2020-12-25 DIAGNOSIS — H16032 Corneal ulcer with hypopyon, left eye: Secondary | ICD-10-CM | POA: Diagnosis not present

## 2020-12-25 DIAGNOSIS — H2513 Age-related nuclear cataract, bilateral: Secondary | ICD-10-CM | POA: Diagnosis not present

## 2020-12-25 NOTE — Telephone Encounter (Signed)
Medicare only covers nursing aids are only available with other medically necessary home health services such as home PT or home nursing care. Is she receiving home health? If not then need to refer to [facility] team

## 2020-12-25 NOTE — Chronic Care Management (AMB) (Signed)
Chronic Care Management    Clinical Social Work Note  12/25/2020 Name: Mariah [name] MRN: [id number] DOB: 1946/05/13  Mariah [name] is a 75 y.o. year old female who is a primary care patient of Bacigalupo, Dionne Bucy, MD. The CCM team was consulted to assist the patient with chronic disease management and/or care coordination needs related to: [employer] .   Collaboration with patient's niece and POA for follow up visit in response to provider referral for social work chronic care management and care coordination services.   Consent to Services:  The patient was given information about Chronic Care Management services, agreed to services, and gave verbal consent prior to initiation of services.  Please see initial visit note for detailed documentation.   Patient agreed to services and consent obtained.   Assessment: Review of patient past medical history, allergies, medications, and health status, including review of relevant consultants reports was performed today as part of a comprehensive evaluation and provision of chronic care management and care coordination services.     SDOH (Social Determinants of Health) assessments and interventions performed:    Advanced Directives Status: Not addressed in this encounter.  CCM Care Plan  Allergies  Allergen Reactions  . Amoxicillin Diarrhea  . Clindamycin/Lincomycin     diarrhea    Outpatient Encounter Medications as of 12/24/2020  Medication Sig Note  . albuterol (VENTOLIN HFA) 108 (90 Base) MCG/ACT inhaler INHALE 1-2 PUFFS INTO THE LUNGS EVERY 6 HOURS AS NEEDED FOR WHEEZING OR SHORTNESS OF BREATH.   [name] apixaban (ELIQUIS) 5 MG TABS tablet Take 1 tablet (5 mg total) by mouth 2 (two) times daily. (Patient not taking: Reported on 11/18/2020)   . betamethasone dipropionate (DIPROLENE) 0.05 % ointment Apply topically daily.   [name] BREO ELLIPTA 100-25 MCG/INH AEPB TAKE 1 PUFF BY MOUTH EVERY DAY   . calcipotriene (DOVONOX) 0.005 % cream APPLY  TO SKIN ONCE A DAY APPLY DAILY ON HANDS, FEET AND WRIST X 2 WEEKS, THEN USE 5 DAYS A WEEK   . Calcipotriene 0.005 % solution APPLY 1 A SMALL AMOUNT TO SCALP ONCE A DAY APPLY TO SCALP DAILY FOR 2 WEEKS, THEN USE ON SCALP 5 DAYS A WEEK   . Cholecalciferol (VITAMIN D3) 2000 units TABS Take 2,000 Units by mouth daily.   . clobetasol (TEMOVATE) 0.05 % external solution APPLY TO AFFECTED AREA TWICE A DAY   . COSENTYX SENSOREADY, 300 MG, 150 MG/ML SOAJ Inject 2 Syringes into the skin every 28 (twenty-eight) days.   [name] donepezil (ARICEPT) 10 MG tablet Take 10 mg by mouth daily.   . ferrous sulfate 325 (65 FE) MG tablet Take 325 mg by mouth 2 (two) times daily.   . folic acid (FOLVITE) 1 MG tablet Take 1 mg by mouth daily.   . furosemide (LASIX) 40 MG tablet Take 1 tablet (40 mg total) by mouth daily. If weight 127 lb or less, take 20 mg by mouth daily.   [name] gabapentin (NEURONTIN) 300 MG capsule Take 1 capsule (300 mg total) by mouth 2 (two) times daily.   [name] levocetirizine (XYZAL) 5 MG tablet TAKE 1 TABLET BY MOUTH EVERY DAY IN THE EVENING   . methotrexate (RHEUMATREX) 2.5 MG tablet Take 15 mg by mouth once a week.  09/17/2020: Monday   . metoprolol succinate (TOPROL-XL) 50 MG 24 hr tablet TAKE 1 TABLET BY MOUTH DAILY. TAKE WITH OR IMMEDIATELY FOLLOWING A MEAL.   . pantoprazole (PROTONIX) 20 MG tablet TAKE 1 TABLET (20 MG  TOTAL) BY MOUTH 2 (TWO) TIMES DAILY BEFORE A MEAL.   [name] potassium chloride SA (KLOR-CON) 20 MEQ tablet Take 2 tablets (40 mEq total) by mouth daily.   [name] rOPINIRole (REQUIP) 1 MG tablet TAKE 1 TABLET (1 MG TOTAL) BY MOUTH AT BEDTIME.   . simvastatin (ZOCOR) 20 MG tablet TAKE 1 TABLET BY MOUTH EVERY DAY (Patient taking differently: Take 20 mg by mouth daily at 6 PM.)   . traMADol (ULTRAM) 50 MG tablet TAKE 1 TABLET BY MOUTH EVERY 6 HOURS AS NEEDED FOR MODERATE OR SEVERE PAIN    No facility-administered encounter medications on file as of 12/24/2020.    Patient Active Problem List   Diagnosis  Date Noted  . Hypokalemia 09/19/2020  . Orthostatic hypotension 09/18/2020  . Syncope 09/17/2020  . History of DVT (deep vein thrombosis) 07/09/2020  . 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 Congestive heart failure ([place]) 06/18/2020  . Weakness   . Protein-calorie malnutrition, severe 06/05/2020  . Hypoalbuminemia 06/03/2020  . Cutaneous horn 06/03/2020  . Pleural effusion 06/03/2020  . Chronic diastolic CHF (congestive heart failure) ([place]) 06/03/2020  . Acute respiratory failure with hypoxia ([place]) 06/02/2020  . Irregular heartbeat 05/30/2020  . Bilateral edema of lower extremity 05/30/2020  . Blood transfusion reaction 05/30/2020  . CKD (chronic kidney disease), stage III ([place]) 05/26/2020  . Acute deep vein thrombosis (DVT) of tibial vein of left lower extremity ([place]) 04/05/2020  . Mental confusion 04/01/2020  . Unintentional weight loss 03/01/2020  . Memory loss 03/01/2020  . Allergic rhinitis 01/10/2018  . Cholelithiasis 03/16/2016  . Peripheral arterial disease ([place]) 02/05/2016  . 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 Abdominal aortic aneurysm (AAA) ([place]) 01/13/2016  . 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 Nerve root inflammation 10/25/2015  . Psoriatic arthritis ([place]) 10/07/2015  . 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 Right hip pain 08/12/2015  . Arthralgia of multiple joints 08/12/2015  . Age related osteoporosis 08/12/2015  . COPD with acute exacerbation ([place]) 06/24/2015  . Senile purpura ([place]) 06/24/2015  . Abnormal serum level of alkaline phosphatase 06/18/2015  . Lethargy 06/18/2015  . Gastro-esophageal reflux disease without esophagitis 06/18/2015  . Restless leg 06/18/2015  . Avitaminosis D 06/18/2015  . Mechanical and motor problems with internal organs 10/11/2009  . Hypercholesteremia 10/11/2009  . Arthritis, degenerative 09/25/2009  . Essential (primary) hypertension 09/25/2009  . Psoriasis 09/25/2009  . Compulsive tobacco user syndrome  09/25/2009    Conditions to be addressed/monitored: Dementia; Memory Deficits  Care Plan : Dementia (Adult)  Updates made by Vern Claude, LCSW since 12/25/2020 12:00 AM    Problem: Psychosocial Adjustment to Dementia     Goal: Optimal Coping   Start Date: 10/31/2020  This Visit's Progress: On track  Recent Progress: On track  Priority: Medium  Note:   Current Barriers:  . Chronic Mental Health needs related to adjustment to memory challenges and loss of independence . Memory Deficits . Suicidal Ideation/Homicidal Ideation: No  Clinical Social Work Goal(s):  . patient will work with SW bi-weekly by telephone or in person  . patient will work with SW to address concerns related to loss if independence  Interventions: . 1:1 collaboration with Brita Romp, Dionne Bucy, MD regarding development and update of comprehensive plan of care as evidenced by provider attestation and co-signature . Second attempt made to reach patient today, however no answer-was able to reach patient's niece  . Per patient's niece, patient's condition continues to progress and the need for additional  in home support has become apparent. . Patient's niece confirmed follow up with the [facility] however patient has refused participation-cannot smoke on grounds-she would like to consider respite care or assisted living . Collaboration phone call from patient's POA who shares concern regarding patient's condition, options for care discussed-in home care, respite care, assisted living . Contact information provided for local assisted living to review . Patient's POA agreed to address assistance options with patient and will follow up with this Education officer, museum . Emotional support provided to niece regarding patient's condition, alternatives explored to increase patient's socialization   Patient Self Care Activities:  . Performs ADL's independently . Strong family or social support  Patient Coping  Strengths:  . Supportive Relationships . Family . Friends . Able to Communicate Effectively  Patient Self Care Deficits:  . Unable to perform IADLs independently  Patient Goals:  - talk about feelings with a friend, family or spiritual advisor - practice positive thinking and self-talk - keep a calendar with appointment dates      Follow Up Plan: SW will follow up with patient by phone over the next 14 business days       [place], [place] Worker  [facility] [phone]

## 2020-12-25 NOTE — Telephone Encounter (Signed)
Please review request. KW 

## 2020-12-26 NOTE — Patient Instructions (Addendum)
Visit Information  Goals Addressed            This Visit's Progress   . Manage My Emotions       Timeframe:  Long-Range Goal Priority:  Medium Start Date:   10/31/20                          Expected End Date:   05/02/21                   Follow Up Date: 01/02/21    -follow up on referral to [facility]   Why is this important?    When you are stressed, down or upset, your body reacts too.   For example, your blood pressure may get higher; you may have a headache or stomachache.   When your emotions get the best of you, your body's ability to fight off cold and flu gets weak.   These steps will help you manage your emotions.     Notes:        The patient verbalized understanding of instructions, educational materials, and care plan provided today and declined offer to receive copy of patient instructions, educational materials, and care plan.   Telephone follow up appointment with care management team member scheduled for:01/02/21   Elliot Gurney, [place] Worker  [facility] [phone]

## 2020-12-26 NOTE — Telephone Encounter (Signed)
Patients niece states she get home care PT and OT. She states that she contacted insurance because she cannot get patient to take shower she states that patient has not taking a bath since November and that she sponge bathes, she states that she had OT to help with with bathing patient yesterday but states that she only has there help for so long.

## 2020-12-30 ENCOUNTER — Telehealth: Payer: Self-pay

## 2020-12-30 NOTE — Telephone Encounter (Signed)
Copied from [place] [id number]. Topic: Appointment Scheduling - Scheduling Inquiry for Clinic >> Dec 30, 2020 10:06 AM Mariah [name] wrote: Reason for CRM: Pt has a growth in/out of her nose and would like to be seen this week in the afternoon/ Pt asked if Simona Huh could see her if Dr. Dorothea Ogle / Please advise asap

## 2020-12-31 ENCOUNTER — Ambulatory Visit
Admission: RE | Admit: 2020-12-31 | Discharge: 2020-12-31 | Disposition: A | Discharge status: Home / Self Care | Payer: PPO | Source: Ambulatory Visit | Attending: Family Medicine | Admitting: Family Medicine

## 2020-12-31 ENCOUNTER — Other Ambulatory Visit: Payer: Self-pay

## 2020-12-31 DIAGNOSIS — Z1231 Encounter for screening mammogram for malignant neoplasm of breast: Secondary | ICD-10-CM | POA: Diagnosis not present

## 2020-12-31 NOTE — Telephone Encounter (Signed)
May schedule 4 pm Friday.

## 2021-01-01 DIAGNOSIS — H2513 Age-related nuclear cataract, bilateral: Secondary | ICD-10-CM | POA: Diagnosis not present

## 2021-01-01 DIAGNOSIS — H35033 Hypertensive retinopathy, bilateral: Secondary | ICD-10-CM | POA: Diagnosis not present

## 2021-01-01 DIAGNOSIS — H179 Unspecified corneal scar and opacity: Secondary | ICD-10-CM | POA: Diagnosis not present

## 2021-01-01 DIAGNOSIS — I10 Essential (primary) hypertension: Secondary | ICD-10-CM | POA: Diagnosis not present

## 2021-01-02 ENCOUNTER — Other Ambulatory Visit: Payer: Self-pay | Admitting: Family Medicine

## 2021-01-02 ENCOUNTER — Ambulatory Visit: Payer: Self-pay | Admitting: *Deleted

## 2021-01-02 ENCOUNTER — Telehealth: Payer: Self-pay

## 2021-01-02 DIAGNOSIS — I10 Essential (primary) hypertension: Secondary | ICD-10-CM | POA: Diagnosis not present

## 2021-01-02 DIAGNOSIS — R928 Other abnormal and inconclusive findings on diagnostic imaging of breast: Secondary | ICD-10-CM

## 2021-01-02 DIAGNOSIS — H2513 Age-related nuclear cataract, bilateral: Secondary | ICD-10-CM | POA: Diagnosis not present

## 2021-01-02 DIAGNOSIS — J449 Chronic obstructive pulmonary disease, unspecified: Secondary | ICD-10-CM | POA: Diagnosis not present

## 2021-01-02 DIAGNOSIS — R413 Other amnesia: Secondary | ICD-10-CM

## 2021-01-02 DIAGNOSIS — N6489 Other specified disorders of breast: Secondary | ICD-10-CM

## 2021-01-02 NOTE — Telephone Encounter (Signed)
Copied from [place] [id number]. Topic: General - Other >> Jan 02, 2021 11:07 AM Yvette Rack wrote: Reason for CRM: Matt with [employer] called for an update on fax that was sent on 12/24/20. Cb# [phone]

## 2021-01-02 NOTE — Patient Instructions (Signed)
Visit Information   Goals Addressed             This Visit's Progress    Manage My Emotions       Timeframe:  Long-Range Goal Priority:  Medium Start Date:   10/31/20                          Expected End Date:   01/02/21                 Follow Up Date: 01/02/21    -follow up on programs and services at the [facility]   Why is this important?   When you are stressed, down or upset, your body reacts too.  For example, your blood pressure may get higher; you may have a headache or stomachache.  When your emotions get the best of you, your body's ability to fight off cold and flu gets weak.  These steps will help you manage your emotions.             The patient verbalized understanding of instructions, educational materials, and care plan provided today and declined offer to receive copy of patient instructions, educational materials, and care plan.   Telephone follow up appointment with care management team member scheduled for: 01/30/21   Elliot Gurney, [place] Worker  [facility] [phone]

## 2021-01-02 NOTE — Chronic Care Management (AMB) (Signed)
Chronic Care Management    Clinical Social Work Note  01/02/2021 Name: Mariah [name] MRN: [id number] DOB: November 06, 1945  Mariah [name] is a 75 y.o. year old female who is a primary care patient of Bacigalupo, Dionne Bucy, MD. The CCM team was consulted to assist the patient with chronic disease management and/or care coordination needs related to: [employer] .   Collaboration with patient' s POA  for follow up visit in response to provider referral for social work chronic care management and care coordination services.   Consent to Services:  The patient was given information about Chronic Care Management services, agreed to services, and gave verbal consent prior to initiation of services.  Please see initial visit note for detailed documentation.   Patient agreed to services and consent obtained.   Assessment: Review of patient past medical history, allergies, medications, and health status, including review of relevant consultants reports was performed today as part of a comprehensive evaluation and provision of chronic care management and care coordination services.     SDOH (Social Determinants of Health) assessments and interventions performed:    Advanced Directives Status: Not addressed in this encounter.  CCM Care Plan  Allergies  Allergen Reactions   Amoxicillin Diarrhea   Clindamycin/Lincomycin     diarrhea    Outpatient Encounter Medications as of 01/02/2021  Medication Sig Note   albuterol (VENTOLIN HFA) 108 (90 Base) MCG/ACT inhaler INHALE 1-2 PUFFS INTO THE LUNGS EVERY 6 HOURS AS NEEDED FOR WHEEZING OR SHORTNESS OF BREATH.    apixaban (ELIQUIS) 5 MG TABS tablet Take 1 tablet (5 mg total) by mouth 2 (two) times daily. (Patient not taking: Reported on 11/18/2020)    betamethasone dipropionate (DIPROLENE) 0.05 % ointment Apply topically daily.    BREO ELLIPTA 100-25 MCG/INH AEPB TAKE 1 PUFF BY MOUTH EVERY DAY    calcipotriene (DOVONOX) 0.005 % cream APPLY TO SKIN ONCE A  DAY APPLY DAILY ON HANDS, FEET AND WRIST X 2 WEEKS, THEN USE 5 DAYS A WEEK    Calcipotriene 0.005 % solution APPLY 1 A SMALL AMOUNT TO SCALP ONCE A DAY APPLY TO SCALP DAILY FOR 2 WEEKS, THEN USE ON SCALP 5 DAYS A WEEK    Cholecalciferol (VITAMIN D3) 2000 units TABS Take 2,000 Units by mouth daily.    clobetasol (TEMOVATE) 0.05 % external solution APPLY TO AFFECTED AREA TWICE A DAY    COSENTYX SENSOREADY, 300 MG, 150 MG/ML SOAJ Inject 2 Syringes into the skin every 28 (twenty-eight) days.    donepezil (ARICEPT) 10 MG tablet Take 10 mg by mouth daily.    ferrous sulfate 325 (65 FE) MG tablet Take 325 mg by mouth 2 (two) times daily.    folic acid (FOLVITE) 1 MG tablet Take 1 mg by mouth daily.    furosemide (LASIX) 40 MG tablet Take 1 tablet (40 mg total) by mouth daily. If weight 127 lb or less, take 20 mg by mouth daily.    gabapentin (NEURONTIN) 300 MG capsule Take 1 capsule (300 mg total) by mouth 2 (two) times daily.    levocetirizine (XYZAL) 5 MG tablet TAKE 1 TABLET BY MOUTH EVERY DAY IN THE EVENING    methotrexate (RHEUMATREX) 2.5 MG tablet Take 15 mg by mouth once a week.  09/17/2020: Monday    metoprolol succinate (TOPROL-XL) 50 MG 24 hr tablet TAKE 1 TABLET BY MOUTH DAILY. TAKE WITH OR IMMEDIATELY FOLLOWING A MEAL.    pantoprazole (PROTONIX) 20 MG tablet TAKE 1 TABLET (20 MG  TOTAL) BY MOUTH 2 (TWO) TIMES DAILY BEFORE A MEAL.    potassium chloride SA (KLOR-CON) 20 MEQ tablet Take 2 tablets (40 mEq total) by mouth daily.    rOPINIRole (REQUIP) 1 MG tablet TAKE 1 TABLET BY MOUTH AT BEDTIME.    simvastatin (ZOCOR) 20 MG tablet TAKE 1 TABLET BY MOUTH EVERY DAY (Patient taking differently: Take 20 mg by mouth daily at 6 PM.)    traMADol (ULTRAM) 50 MG tablet TAKE 1 TABLET BY MOUTH EVERY 6 HOURS AS NEEDED FOR MODERATE OR SEVERE PAIN    No facility-administered encounter medications on file as of 01/02/2021.    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/09/2020   Congestive heart failure ([place]) 06/18/2020   Weakness    Protein-calorie malnutrition, severe 06/05/2020   Hypoalbuminemia 06/03/2020   Cutaneous horn 06/03/2020   Pleural effusion 06/03/2020   Chronic diastolic CHF (congestive heart failure) ([place]) 06/03/2020   Acute respiratory failure with hypoxia ([facility]) 06/02/2020   Irregular heartbeat 05/30/2020   Bilateral edema of lower extremity 05/30/2020   Blood transfusion reaction 05/30/2020   CKD (chronic kidney disease), stage III ([place]) 05/26/2020   Acute deep vein thrombosis (DVT) of tibial vein of left lower extremity ([place]) 04/05/2020   Mental confusion 04/01/2020   Unintentional weight loss 03/01/2020   Memory loss 03/01/2020   Allergic rhinitis 01/10/2018   Cholelithiasis 03/16/2016   Peripheral arterial disease ([place]) 02/05/2016   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   Abdominal aortic aneurysm (AAA) ([place]) 01/13/2016   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   Nerve root inflammation 10/25/2015   Psoriatic arthritis ([place]) 10/07/2015   Degenerative arthritis of hip 10/07/2015   Right hip pain 08/12/2015   Arthralgia of multiple joints 08/12/2015   Age related osteoporosis 08/12/2015   COPD with acute exacerbation ([place]) 06/24/2015   Senile purpura ([place]) 06/24/2015   Abnormal serum level of alkaline phosphatase 06/18/2015   Lethargy 06/18/2015   Gastro-esophageal reflux disease without esophagitis 06/18/2015   Restless leg 06/18/2015   Avitaminosis D 06/18/2015   Mechanical and motor problems with internal organs 10/11/2009   Hypercholesteremia 10/11/2009   Arthritis, degenerative 09/25/2009   Essential (primary) hypertension 09/25/2009   Psoriasis 09/25/2009   Compulsive tobacco user syndrome 09/25/2009    Conditions to be addressed/monitored: Dementia; Memory Deficits  Care  Plan : Dementia (Adult)  Updates made by Vern Claude, LCSW since 01/02/2021 12:00 AM     Problem: Psychosocial Adjustment to Dementia      Goal: Optimal Coping   Start Date: 10/31/2020  This Visit's Progress: On track  Recent Progress: On track  Priority: Medium  Note:   Current Barriers:  Chronic Mental Health needs related to adjustment to memory challenges and loss of independence Memory Deficits Suicidal Ideation/Homicidal Ideation: No  Clinical Social Work Goal(s):  patient will work with SW bi-weekly by telephone or in person  patient will work with SW to address concerns related to loss if independence  Interventions: 1:1 collaboration with Brita Romp, Dionne Bucy, MD regarding development and update of comprehensive plan of care as evidenced by provider attestation and co-signature Follow up phone call to patient's POA to discuss Day Program options, possible placement and requested respite care Per patient's POA all available options discussed in conjunction with patient's niece they have decided to delay placement and look  into involving patient in the activities at the [facility] for right now. No further community resource needs verbalized at this time, however this Education officer, museum will follow up with patient and her family in approximately 1 month to follow up on patient's progress and to assess for any additional community resource needs   Patient Self Care Activities:  Performs ADL's independently Strong family or social support  Patient Coping Strengths:  Supportive Relationships Family Friends Able to Communicate Effectively  Patient Self Care Deficits:  Unable to perform IADLs independently  Patient Goals:  - talk about feelings with a friend, family or spiritual advisor - practice positive thinking and self-talk - keep a calendar with appointment dates      Follow Up Plan: SW will follow up with patient by phone over the next 30 business days        [place], [place] Worker  [facility] [phone]

## 2021-01-06 DIAGNOSIS — R413 Other amnesia: Secondary | ICD-10-CM | POA: Diagnosis not present

## 2021-01-06 DIAGNOSIS — Z86718 Personal history of other venous thrombosis and embolism: Secondary | ICD-10-CM | POA: Diagnosis not present

## 2021-01-06 DIAGNOSIS — R410 Disorientation, unspecified: Secondary | ICD-10-CM | POA: Diagnosis not present

## 2021-01-06 NOTE — Telephone Encounter (Signed)
I've gotten through all of my paperwork from when I was out of the office and do not have anything related to this.

## 2021-01-06 NOTE — Telephone Encounter (Signed)
Do they want a virtual visit sometime this week to discuss facility options? Or has this been taken care of already? Hope they were able to see Neuro.

## 2021-01-06 NOTE — Telephone Encounter (Signed)
Niece Emmry reports that patient was seen by neurologist today and was started on Namenda. She is interested in possibly getting home health nurse for Mrs. Baswell. She reports that patient needs help with daily activities, especially with bating. Please advise.

## 2021-01-06 NOTE — Telephone Encounter (Signed)
Have you received correspondence from this company about this patient?KW

## 2021-01-07 ENCOUNTER — Telehealth: Payer: Self-pay | Admitting: Family Medicine

## 2021-01-07 NOTE — Telephone Encounter (Signed)
Left detailed message on voicemail.  

## 2021-01-07 NOTE — Telephone Encounter (Signed)
Copied from [place] [id number]. Topic: Quick Communication - Home Health Verbal Orders >> Jan 07, 2021  1:09 PM Jodie Echevaria wrote: Caller/Agency: [place] // [facility] Number: [phone] ok to LM  Requesting OT/PT/Skilled Nursing/Social Work/Speech Therapy: OT  Frequency: 1 w 3

## 2021-01-07 NOTE — Telephone Encounter (Signed)
Would need face to face visit (virtual ok) for ordering home health

## 2021-01-07 NOTE — Telephone Encounter (Signed)
OK for verbals 

## 2021-01-07 NOTE — Telephone Encounter (Signed)
[employer] will refax form.

## 2021-01-08 ENCOUNTER — Other Ambulatory Visit: Payer: Self-pay | Admitting: Neurology

## 2021-01-08 DIAGNOSIS — Z86718 Personal history of other venous thrombosis and embolism: Secondary | ICD-10-CM

## 2021-01-09 ENCOUNTER — Ambulatory Visit: Payer: Self-pay

## 2021-01-09 NOTE — Telephone Encounter (Signed)
[name] reports that Mariah [name] is now having watery diarrhea 3-4 times a day. Patient is not making it into the bathroom. Mrs. Paulette requesting an appointment in the after noon either with Dr. Jacinto Reap or Simona Huh. Please advise.

## 2021-01-09 NOTE — Telephone Encounter (Signed)
Patient's niece Arabia Nylund called and says the patient is having diarrhea that is black and runny like tar. She says the patient only had 1 stool in the past 24 hours, but anywhere from 2-4 stools over the past 3-4 days. She says the patient also has scabs on her body from psoriasis and she's been picking them and putting the scabs in her mouth. Rachel seems to think maybe the patient has a virus in her stomach due to the scabs being eaten. She says the patient doesn't have vomiting, no abdominal pain, no nausea, no change in appetite from her normal, she's urinating and drinking liquids. Lesleigh says she called earlier today and spoke with a nurse about an appointment and was told the patient could possibly see Simona Huh tomorrow at 2pm, but she will have to call her back to confirm. Jazzy says she never received a call from anyone and wants to know if the patient could come in for that appointment tomorrow. I advised I will have to send this to the office because I don't see any available appointments to schedule, advised someone from the office will call with the appointment. Care advice given, niece verbalized understanding.  Reason for Disposition  [1] MILD diarrhea (e.g., 1-3 or more stools than normal in past 24 hours) without known cause AND [2] present >  7 days  Answer Assessment - Initial Assessment Questions 1. DIARRHEA SEVERITY: "How bad is the diarrhea?" "How many more stools have you had in the past 24 hours than normal?"    - NO DIARRHEA (SCALE 0)   - MILD (SCALE 1-3): Few loose or mushy BMs; increase of 1-3 stools over normal daily number of stools; mild increase in ostomy output.   -  MODERATE (SCALE 4-7): Increase of 4-6 stools daily over normal; moderate increase in ostomy output. * SEVERE (SCALE 8-10; OR 'WORST POSSIBLE'): Increase of 7 or more stools daily over normal; moderate increase in ostomy output; incontinence.     Once in the last 24 hours, but several times on other days 2.  ONSET: "When did the diarrhea begin?"      5 days ago 3. BM CONSISTENCY: "How loose or watery is the diarrhea?"      Loose not watery, thick tar runny 4. VOMITING: "Are you also vomiting?" If Yes, ask: "How many times in the past 24 hours?"      No 5. ABDOMINAL PAIN: "Are you having any abdominal pain?" If Yes, ask: "What does it feel like?" (e.g., crampy, dull, intermittent, constant)      No 6. ABDOMINAL PAIN SEVERITY: If present, ask: "How bad is the pain?"  (e.g., Scale 1-10; mild, moderate, or severe)   - MILD (1-3): doesn't interfere with normal activities, abdomen soft and not tender to touch    - MODERATE (4-7): interferes with normal activities or awakens from sleep, abdomen tender to touch    - SEVERE (8-10): excruciating pain, doubled over, unable to do any normal activities       N/A 7. ORAL INTAKE: If vomiting, "Have you been able to drink liquids?" "How much liquids have you had in the past 24 hours?"     No vomiting 8. HYDRATION: "Any signs of dehydration?" (e.g., dry mouth [not just dry lips], too weak to stand, dizziness, new weight loss) "When did you last urinate?"     No 9. EXPOSURE: "Have you traveled to a foreign country recently?" "Have you been exposed to anyone with diarrhea?" "Could you have  eaten any food that was spoiled?"     No 10. ANTIBIOTIC USE: "Are you taking antibiotics now or have you taken antibiotics in the past 2 months?"       No 11. OTHER SYMPTOMS: "Do you have any other symptoms?" (e.g., fever, blood in stool)       Black stools 12. PREGNANCY: "Is there any chance you are pregnant?" "When was your last menstrual period?"       No  Protocols used: [facility]

## 2021-01-09 NOTE — Telephone Encounter (Signed)
Okay to offer what ever appointments are available with either of those providers.  If nothing is available, could consider acute visit at one of the other clinics versus urgent care

## 2021-01-10 ENCOUNTER — Inpatient Hospital Stay: Payer: PPO

## 2021-01-10 ENCOUNTER — Ambulatory Visit: Payer: Self-pay

## 2021-01-10 ENCOUNTER — Other Ambulatory Visit: Payer: Self-pay

## 2021-01-10 ENCOUNTER — Inpatient Hospital Stay
Admission: EM | Admit: 2021-01-10 | Discharge: 2021-01-14 | DRG: 866 | Disposition: A | Discharge status: Home / Self Care | Payer: PPO | Attending: Internal Medicine | Admitting: Internal Medicine

## 2021-01-10 ENCOUNTER — Emergency Department: Payer: PPO

## 2021-01-10 ENCOUNTER — Encounter: Payer: Self-pay | Admitting: Internal Medicine

## 2021-01-10 DIAGNOSIS — R197 Diarrhea, unspecified: Secondary | ICD-10-CM

## 2021-01-10 DIAGNOSIS — K5732 Diverticulitis of large intestine without perforation or abscess without bleeding: Secondary | ICD-10-CM | POA: Diagnosis present

## 2021-01-10 DIAGNOSIS — K760 Fatty (change of) liver, not elsewhere classified: Secondary | ICD-10-CM | POA: Diagnosis present

## 2021-01-10 DIAGNOSIS — Z20822 Contact with and (suspected) exposure to covid-19: Secondary | ICD-10-CM | POA: Diagnosis present

## 2021-01-10 DIAGNOSIS — I714 Abdominal aortic aneurysm, without rupture: Secondary | ICD-10-CM | POA: Diagnosis present

## 2021-01-10 DIAGNOSIS — Z7901 Long term (current) use of anticoagulants: Secondary | ICD-10-CM

## 2021-01-10 DIAGNOSIS — E785 Hyperlipidemia, unspecified: Secondary | ICD-10-CM | POA: Diagnosis present

## 2021-01-10 DIAGNOSIS — Z96641 Presence of right artificial hip joint: Secondary | ICD-10-CM | POA: Diagnosis not present

## 2021-01-10 DIAGNOSIS — E559 Vitamin D deficiency, unspecified: Secondary | ICD-10-CM | POA: Diagnosis not present

## 2021-01-10 DIAGNOSIS — E860 Dehydration: Secondary | ICD-10-CM | POA: Diagnosis present

## 2021-01-10 DIAGNOSIS — R413 Other amnesia: Secondary | ICD-10-CM

## 2021-01-10 DIAGNOSIS — Z8249 Family history of ischemic heart disease and other diseases of the circulatory system: Secondary | ICD-10-CM | POA: Diagnosis not present

## 2021-01-10 DIAGNOSIS — I110 Hypertensive heart disease with heart failure: Secondary | ICD-10-CM | POA: Diagnosis present

## 2021-01-10 DIAGNOSIS — E7800 Pure hypercholesterolemia, unspecified: Secondary | ICD-10-CM | POA: Diagnosis not present

## 2021-01-10 DIAGNOSIS — I5032 Chronic diastolic (congestive) heart failure: Secondary | ICD-10-CM | POA: Diagnosis present

## 2021-01-10 DIAGNOSIS — Z720 Tobacco use: Secondary | ICD-10-CM | POA: Diagnosis present

## 2021-01-10 DIAGNOSIS — R933 Abnormal findings on diagnostic imaging of other parts of digestive tract: Secondary | ICD-10-CM | POA: Diagnosis not present

## 2021-01-10 DIAGNOSIS — I10 Essential (primary) hypertension: Secondary | ICD-10-CM | POA: Diagnosis present

## 2021-01-10 DIAGNOSIS — E876 Hypokalemia: Secondary | ICD-10-CM | POA: Diagnosis present

## 2021-01-10 DIAGNOSIS — Z880 Allergy status to penicillin: Secondary | ICD-10-CM | POA: Diagnosis not present

## 2021-01-10 DIAGNOSIS — F17210 Nicotine dependence, cigarettes, uncomplicated: Secondary | ICD-10-CM | POA: Diagnosis not present

## 2021-01-10 DIAGNOSIS — B349 Viral infection, unspecified: Secondary | ICD-10-CM | POA: Diagnosis not present

## 2021-01-10 DIAGNOSIS — K8020 Calculus of gallbladder without cholecystitis without obstruction: Secondary | ICD-10-CM | POA: Diagnosis present

## 2021-01-10 DIAGNOSIS — J449 Chronic obstructive pulmonary disease, unspecified: Secondary | ICD-10-CM

## 2021-01-10 DIAGNOSIS — K219 Gastro-esophageal reflux disease without esophagitis: Secondary | ICD-10-CM | POA: Diagnosis not present

## 2021-01-10 DIAGNOSIS — L4050 Arthropathic psoriasis, unspecified: Secondary | ICD-10-CM | POA: Diagnosis not present

## 2021-01-10 DIAGNOSIS — R109 Unspecified abdominal pain: Secondary | ICD-10-CM | POA: Diagnosis not present

## 2021-01-10 DIAGNOSIS — Z888 Allergy status to other drugs, medicaments and biological substances status: Secondary | ICD-10-CM

## 2021-01-10 DIAGNOSIS — Z825 Family history of asthma and other chronic lower respiratory diseases: Secondary | ICD-10-CM

## 2021-01-10 DIAGNOSIS — R011 Cardiac murmur, unspecified: Secondary | ICD-10-CM | POA: Diagnosis not present

## 2021-01-10 DIAGNOSIS — Z8261 Family history of arthritis: Secondary | ICD-10-CM

## 2021-01-10 DIAGNOSIS — R7401 Elevation of levels of liver transaminase levels: Secondary | ICD-10-CM | POA: Diagnosis present

## 2021-01-10 DIAGNOSIS — Z79899 Other long term (current) drug therapy: Secondary | ICD-10-CM | POA: Diagnosis not present

## 2021-01-10 HISTORY — DX: Chronic diastolic (congestive) heart failure: I50.32

## 2021-01-10 LAB — COMPREHENSIVE METABOLIC PANEL
ALT: 88 U/L — ABNORMAL HIGH (ref 0–44)
AST: 150 U/L — ABNORMAL HIGH (ref 15–41)
Albumin: 2.8 g/dL — ABNORMAL LOW (ref 3.5–5.0)
Alkaline Phosphatase: 226 U/L — ABNORMAL HIGH (ref 38–126)
Anion gap: 7 (ref 5–15)
BUN: 10 mg/dL (ref 8–23)
CO2: 29 mmol/L (ref 22–32)
Calcium: 8.4 mg/dL — ABNORMAL LOW (ref 8.9–10.3)
Chloride: 100 mmol/L (ref 98–111)
Creatinine, Ser: 1.03 mg/dL — ABNORMAL HIGH (ref 0.44–1.00)
GFR, Estimated: 56 mL/min — ABNORMAL LOW (ref >60)
Glucose, Bld: 99 mg/dL (ref 70–99)
Potassium: 3.4 mmol/L — ABNORMAL LOW (ref 3.5–5.1)
Sodium: 136 mmol/L (ref 135–145)
Total Bilirubin: 2.5 mg/dL — ABNORMAL HIGH (ref 0.3–1.2)
Total Protein: 5.9 g/dL — ABNORMAL LOW (ref 6.5–8.1)

## 2021-01-10 LAB — CBC WITH DIFFERENTIAL/PLATELET
Abs Immature Granulocytes: 0.01 10*3/uL (ref 0.00–0.07)
Basophils Absolute: 0 10*3/uL (ref 0.0–0.1)
Basophils Relative: 1 %
Eosinophils Absolute: 0.1 10*3/uL (ref 0.0–0.5)
Eosinophils Relative: 3 %
HCT: 48.5 % — ABNORMAL HIGH (ref 36.0–46.0)
Hemoglobin: 16.5 g/dL — ABNORMAL HIGH (ref 12.0–15.0)
Immature Granulocytes: 0 %
Lymphocytes Relative: 22 %
Lymphs Abs: 0.8 10*3/uL (ref 0.7–4.0)
MCH: 31.7 pg (ref 26.0–34.0)
MCHC: 34 g/dL (ref 30.0–36.0)
MCV: 93.3 fL (ref 80.0–100.0)
Monocytes Absolute: 0.3 10*3/uL (ref 0.1–1.0)
Monocytes Relative: 9 %
Neutro Abs: 2.3 10*3/uL (ref 1.7–7.7)
Neutrophils Relative %: 65 %
Platelets: 110 10*3/uL — ABNORMAL LOW (ref 150–400)
RBC: 5.2 MIL/uL — ABNORMAL HIGH (ref 3.87–5.11)
RDW: 14 % (ref 11.5–15.5)
WBC: 3.6 10*3/uL — ABNORMAL LOW (ref 4.0–10.5)
nRBC: 0 % (ref 0.0–0.2)

## 2021-01-10 LAB — URINALYSIS, COMPLETE (UACMP) WITH MICROSCOPIC
Bilirubin Urine: NEGATIVE
Glucose, UA: NEGATIVE mg/dL
Hgb urine dipstick: NEGATIVE
Ketones, ur: NEGATIVE mg/dL
Leukocytes,Ua: NEGATIVE
Nitrite: NEGATIVE
Protein, ur: NEGATIVE mg/dL
Specific Gravity, Urine: 1.006 (ref 1.005–1.030)
Squamous Epithelial / HPF: NONE SEEN (ref 0–5)
pH: 5 (ref 5.0–8.0)

## 2021-01-10 LAB — RESP PANEL BY RT-PCR (FLU A&B, COVID) ARPGX2
Influenza A by PCR: NEGATIVE
Influenza B by PCR: NEGATIVE
SARS Coronavirus 2 by RT PCR: NEGATIVE

## 2021-01-10 LAB — LIPASE, BLOOD: Lipase: 26 U/L (ref 11–51)

## 2021-01-10 LAB — MAGNESIUM: Magnesium: 2.1 mg/dL (ref 1.7–2.4)

## 2021-01-10 LAB — FERRITIN: Ferritin: 205 ng/mL (ref 11–307)

## 2021-01-10 MED ORDER — ACETAMINOPHEN 325 MG PO TABS
650.0000 mg | ORAL_TABLET | Freq: Four times a day (QID) | ORAL | Status: DC | PRN
  Filled 2021-01-10: qty 2

## 2021-01-10 MED ORDER — ROPINIROLE HCL 1 MG PO TABS
1.0000 mg | ORAL_TABLET | Freq: Every day | ORAL | Status: DC
  Administered 2021-01-10 – 2021-01-13 (×4): 1 mg via ORAL
  Filled 2021-01-10 (×4): qty 1

## 2021-01-10 MED ORDER — METRONIDAZOLE 500 MG PO TABS: 500.0000 mg | ORAL_TABLET | Freq: Three times a day (TID) | ORAL | 0 refills | Status: DC

## 2021-01-10 MED ORDER — POTASSIUM CHLORIDE 10 MEQ/100ML IV SOLN
10.0000 meq | INTRAVENOUS | Status: AC
  Administered 2021-01-11 (×4): 10 meq via INTRAVENOUS
  Filled 2021-01-10 (×5): qty 100

## 2021-01-10 MED ORDER — METOPROLOL SUCCINATE ER 50 MG PO TB24
50.0000 mg | ORAL_TABLET | Freq: Every day | ORAL | Status: DC
  Administered 2021-01-12 – 2021-01-14 (×3): 50 mg via ORAL
  Filled 2021-01-10 (×4): qty 1

## 2021-01-10 MED ORDER — METRONIDAZOLE 500 MG/100ML IV SOLN
500.0000 mg | Freq: Three times a day (TID) | INTRAVENOUS | Status: DC
  Administered 2021-01-11 – 2021-01-13 (×7): 500 mg via INTRAVENOUS
  Filled 2021-01-10 (×10): qty 100

## 2021-01-10 MED ORDER — CIPROFLOXACIN IN D5W 400 MG/200ML IV SOLN
400.0000 mg | Freq: Two times a day (BID) | INTRAVENOUS | Status: DC
  Administered 2021-01-11 – 2021-01-13 (×5): 400 mg via INTRAVENOUS
  Filled 2021-01-10 (×7): qty 200

## 2021-01-10 MED ORDER — PIPERACILLIN-TAZOBACTAM 3.375 G IVPB 30 MIN
3.3750 g | INTRAVENOUS | Status: AC
  Administered 2021-01-10: 3.375 g via INTRAVENOUS
  Filled 2021-01-10: qty 50

## 2021-01-10 MED ORDER — FENTANYL CITRATE (PF) 100 MCG/2ML IJ SOLN: 12.5000 ug | INTRAMUSCULAR | Status: DC | PRN

## 2021-01-10 MED ORDER — CIPROFLOXACIN HCL 500 MG PO TABS: 500.0000 mg | ORAL_TABLET | Freq: Two times a day (BID) | ORAL | 0 refills | Status: DC

## 2021-01-10 MED ORDER — NALOXONE HCL 2 MG/2ML IJ SOSY
0.4000 mg | PREFILLED_SYRINGE | INTRAMUSCULAR | Status: DC | PRN
  Filled 2021-01-10: qty 2

## 2021-01-10 MED ORDER — ACETAMINOPHEN 650 MG RE SUPP: 650.0000 mg | Freq: Four times a day (QID) | RECTAL | Status: DC | PRN

## 2021-01-10 MED ORDER — ALBUTEROL SULFATE (2.5 MG/3ML) 0.083% IN NEBU: 2.5000 mg | INHALATION_SOLUTION | Freq: Four times a day (QID) | RESPIRATORY_TRACT | Status: DC | PRN

## 2021-01-10 MED ORDER — FLUTICASONE FUROATE-VILANTEROL 100-25 MCG/INH IN AEPB
1.0000 | INHALATION_SPRAY | Freq: Every day | RESPIRATORY_TRACT | Status: DC
  Administered 2021-01-11 – 2021-01-14 (×4): 1 via RESPIRATORY_TRACT
  Filled 2021-01-10: qty 28

## 2021-01-10 MED ORDER — IOHEXOL 300 MG/ML  SOLN
75.0000 mL | Freq: Once | INTRAMUSCULAR | Status: AC | PRN
  Administered 2021-01-10: 75 mL via INTRAVENOUS

## 2021-01-10 MED ORDER — SODIUM CHLORIDE 0.9 % IV BOLUS
500.0000 mL | Freq: Once | INTRAVENOUS | Status: AC
  Administered 2021-01-10: 500 mL via INTRAVENOUS

## 2021-01-10 NOTE — Progress Notes (Signed)
Brief note regarding plan, with full H&P to follow:  75 year old female who is being admitted for further evaluation of diarrhea in the setting of acute transaminitis with mildly elevated total bilirubin and alkaline phosphatase as well as potential diverticulitis seen on CT imaging after complaining of multiple days of loose watery stools.  CT imaging showed no evidence of common bile duct dilation or choledocholithiasis, and the patient denies any recent abdominal pain, and no peritoneal signs on physical exam.  Treating for potential diverticulitis with current n.p.o. status as well as IV antibiotics, will further evaluating acute transaminitis with right upper quadrant ultrasound, GGT, direct bilirubin, and repeat CMP in the morning.  We will follow for result of C. Difficile pcr and GI panel by PCR ordered by EDP.  Check acute viral hepatitis panel.  Presenting labs also notable for mild hypokalemia, for which I ordered IV potassium chloride supplementation.  Add on serum magnesium level and refrain from antimotility agents pending infectious work-up.    Babs Bertin, DO Hospitalist

## 2021-01-10 NOTE — ED Notes (Signed)
Report via secure chat to floor nurse.

## 2021-01-10 NOTE — ED Triage Notes (Addendum)
Pt here with diarrhea that started 6 days ago. Pt also here with abd pain. Pt has hx of ruptured appendix. Visitor states that pt has not been able to control her bowels lately as well.

## 2021-01-10 NOTE — ED Provider Notes (Signed)
[facility] Emergency Department Provider Note ____________________________________________   Event Date/Time   First MD Initiated Contact with Patient 01/10/21 1642     (approximate)  I have reviewed the triage vital signs and the nursing notes.   HISTORY  Chief Complaint Diarrhea  HPI Mariah [name] is a 75 y.o. female with history of hypokalemia, CAP, and other history as listed below presents to the emergency department for treatment and evaluation of diarrhea x 6 days. Normal appetite. No fever. No abdominal pain. No history similar to the same.     Past Medical History:  Diagnosis Date   Abdominal aortic aneurysm (AAA) ([place])    Arthritis    CAP (community acquired pneumonia) 06/03/2020   Chronic diastolic heart failure (HCC)    COPD (chronic obstructive pulmonary disease) (HCC)    GERD (gastroesophageal reflux disease)    Headache    sinus   History of adenomatous polyp of colon    Hyperlipidemia    Hypertension    Legionella infection ([place]) 07/17/2020   Psoriasis    Right lumbar radiculitis    Vitamin D deficiency     Patient Active Problem List   Diagnosis Date Noted   Transaminitis 01/11/2021   COPD (chronic obstructive pulmonary disease) (HCC)    Tobacco abuse    Diarrhea 01/10/2021   Hypokalemia 09/19/2020   Orthostatic hypotension 09/18/2020   Syncope 09/17/2020   History of DVT (deep vein thrombosis) 07/09/2020   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   Congestive heart failure ([place]) 06/18/2020   Weakness    Protein-calorie malnutrition, severe 06/05/2020   Hypoalbuminemia 06/03/2020   Cutaneous horn 06/03/2020   Pleural effusion 06/03/2020   Chronic diastolic CHF (congestive heart failure) ([place]) 06/03/2020   Acute respiratory failure with hypoxia ([place]) 06/02/2020   Irregular heartbeat 05/30/2020   Bilateral edema of lower extremity 05/30/2020   Blood transfusion reaction 05/30/2020   CKD (chronic kidney  disease), stage III ([place]) 05/26/2020   Acute deep vein thrombosis (DVT) of tibial vein of left lower extremity ([place]) 04/05/2020   Mental confusion 04/01/2020   Unintentional weight loss 03/01/2020   Memory loss 03/01/2020   Allergic rhinitis 01/10/2018   Cholelithiasis 03/16/2016   Peripheral arterial disease ([place]) 02/05/2016   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   Abdominal aortic aneurysm (AAA) ([place]) 01/13/2016   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   Nerve root inflammation 10/25/2015   Psoriatic arthritis ([place]) 10/07/2015   Degenerative arthritis of hip 10/07/2015   Right hip pain 08/12/2015   Arthralgia of multiple joints 08/12/2015   Age related osteoporosis 08/12/2015   COPD with acute exacerbation ([place]) 06/24/2015   Senile purpura ([place]) 06/24/2015   Abnormal serum level of alkaline phosphatase 06/18/2015   Lethargy 06/18/2015   Gastro-esophageal reflux disease without esophagitis 06/18/2015   Restless leg 06/18/2015   Avitaminosis D 06/18/2015   Mechanical and motor problems with internal organs 10/11/2009   Hypercholesteremia 10/11/2009   Arthritis, degenerative 09/25/2009   Essential (primary) hypertension 09/25/2009   Psoriasis 09/25/2009   Compulsive tobacco user syndrome 09/25/2009    Past Surgical History:  Procedure Laterality Date   APPENDECTOMY  2000   BREAST BIOPSY Right 09/2016   radial scar;COMPLEX SCLEROSING LESION   BREAST LUMPECTOMY Right 11/24/2016   COMPLEX SCLEROSING LESION   BREAST LUMPECTOMY WITH NEEDLE LOCALIZATION Right 11/24/2016   Procedure: BREAST LUMPECTOMY WITH NEEDLE LOCALIZATION;  Surgeon: Leonie Green, MD;  Location: [facility] ORS;  Service: General;  Laterality: Right;   COLONOSCOPY WITH  PROPOFOL N/A 08/10/2016   Procedure: COLONOSCOPY WITH PROPOFOL;  Surgeon: Manya Silvas, MD;  Location: [facility] ENDOSCOPY;  Service: Endoscopy;  Laterality: N/A;   ESOPHAGOGASTRODUODENOSCOPY (EGD) WITH PROPOFOL N/A 08/10/2016   Procedure:  ESOPHAGOGASTRODUODENOSCOPY (EGD) WITH PROPOFOL;  Surgeon: Manya Silvas, MD;  Location: [facility] ENDOSCOPY;  Service: Endoscopy;  Laterality: N/A;   FOOT SURGERY Bilateral    JOINT REPLACEMENT Right 01/27/2016   hip   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   TOTAL HIP ARTHROPLASTY Right 01/27/2016   Procedure: TOTAL HIP ARTHROPLASTY;  Surgeon: Dereck Leep, MD;  Location: [facility] ORS;  Service: Orthopedics;  Laterality: Right;    Prior to Admission medications   Medication Sig Start Date End Date Taking? Authorizing Provider  Cholecalciferol (VITAMIN D3) 2000 units TABS Take 2,000 Units by mouth daily.   Yes [provider]  COSENTYX SENSOREADY, 300 MG, 150 MG/ML SOAJ Inject 2 Syringes into the skin every 28 (twenty-eight) days. 05/31/20  Yes [provider]  donepezil (ARICEPT) 10 MG tablet Take 10 mg by mouth daily. 07/01/20  Yes [provider]  ferrous sulfate 325 (65 FE) MG tablet Take 325 mg by mouth 2 (two) times daily. 05/28/20  Yes [provider]  folic acid (FOLVITE) 1 MG tablet Take 1 mg by mouth daily. 09/02/20  Yes [provider]  gabapentin (NEURONTIN) 300 MG capsule Take 1 capsule (300 mg total) by mouth 2 (two) times daily. 10/08/20  Yes Bacigalupo, Dionne Bucy, MD  levocetirizine (XYZAL) 5 MG tablet TAKE 1 TABLET BY MOUTH EVERY DAY IN THE EVENING 12/18/20  Yes Bacigalupo, Dionne Bucy, MD  metoprolol succinate (TOPROL-XL) 50 MG 24 hr tablet TAKE 1 TABLET BY MOUTH DAILY. TAKE WITH OR IMMEDIATELY FOLLOWING A MEAL. 11/25/20  Yes Bacigalupo, Dionne Bucy, MD  pantoprazole (PROTONIX) 20 MG tablet TAKE 1 TABLET (20 MG TOTAL) BY MOUTH 2 (TWO) TIMES DAILY BEFORE A MEAL. 06/26/20  Yes Bacigalupo, Dionne Bucy, MD  rOPINIRole (REQUIP) 1 MG tablet TAKE 1 TABLET BY MOUTH AT BEDTIME. 01/02/21  Yes Bacigalupo, Dionne Bucy, MD  albuterol (VENTOLIN HFA) 108 (90 Base) MCG/ACT inhaler INHALE 1-2 PUFFS INTO THE LUNGS EVERY 6 HOURS AS NEEDED FOR WHEEZING OR SHORTNESS OF BREATH. 05/22/20    Carles Collet M, PA-C  betamethasone dipropionate (DIPROLENE) 0.05 % ointment Apply topically daily. 01/01/20   Ralene Bathe, MD  BREO ELLIPTA 100-25 MCG/INH AEPB TAKE 1 PUFF BY MOUTH EVERY DAY 09/01/20   [name], MD  calcipotriene (DOVONOX) 0.005 % cream APPLY TO SKIN ONCE A DAY APPLY DAILY ON HANDS, FEET AND WRIST X 2 WEEKS, THEN USE 5 DAYS A WEEK 01/01/20   Ralene Bathe, MD  Calcipotriene 0.005 % solution APPLY 1 A SMALL AMOUNT TO SCALP ONCE A DAY APPLY TO SCALP DAILY FOR 2 WEEKS, THEN USE ON SCALP 5 DAYS A WEEK 09/02/20   Ralene Bathe, MD  ciprofloxacin (CIPRO) 500 MG tablet Take 1 tablet (500 mg total) by mouth 2 (two) times daily for 5 days. 01/13/21 01/18/21  Eugenie Filler, MD  clobetasol (TEMOVATE) 0.05 % external solution APPLY TO AFFECTED AREA TWICE A DAY 09/01/20   Bacigalupo, Dionne Bucy, MD  furosemide (LASIX) 40 MG tablet Take 1 tablet (40 mg total) by mouth daily. If weight 127 lb or less, take 20 mg by mouth daily. 01/16/21   Eugenie Filler, MD  loperamide (IMODIUM) 2 MG capsule Take 1 capsule (2 mg total) by mouth as needed for diarrhea  or loose stools. 01/13/21   Eugenie Filler, MD  metroNIDAZOLE (FLAGYL) 500 MG tablet Take 1 tablet (500 mg total) by mouth 3 (three) times daily for 5 days. 01/13/21 01/18/21  Eugenie Filler, MD  potassium chloride SA (KLOR-CON) 20 MEQ tablet Take 2 tablets (40 mEq total) by mouth daily. 01/16/21   Eugenie Filler, MD  traMADol (ULTRAM) 50 MG tablet TAKE 1 TABLET BY MOUTH EVERY 6 HOURS AS NEEDED FOR MODERATE OR SEVERE PAIN 12/18/20   Bacigalupo, Dionne Bucy, MD    Allergies Amoxicillin and Clindamycin/lincomycin  Family History  Problem Relation Age of Onset   Cancer Mother        kidney   Arthritis Father    Heart disease Father    COPD Sister    Fibromyalgia Sister    Osteoarthritis Sister    Osteoarthritis Maternal Aunt    Colon cancer Paternal Aunt    Colon cancer Paternal Uncle    Colon cancer Paternal  Grandmother    Breast cancer Neg Hx     Social History Social History   Tobacco Use   Smoking status: Every Day    Packs/day: 1.50    Years: 40.00    Pack years: 60.00    Types: Cigarettes   Smokeless tobacco: Never   Tobacco comments:    0.5 PPD 06/19/2020  Vaping Use   Vaping Use: Some days  Substance Use Topics   Alcohol use: Yes    Comment: occasionally glass of wine   Drug use: No    Review of Systems  Constitutional: No fever/chills Eyes: No visual changes. ENT: No sore throat. Cardiovascular: Denies chest pain. Respiratory: Denies shortness of breath. Gastrointestinal: No abdominal pain.  No nausea, no vomiting.  Positive for diarrhea.  No constipation. Genitourinary: Negative for dysuria. Musculoskeletal: Negative for back pain. Skin: Negative for rash. Neurological: Negative for headaches, focal weakness or numbness. ____________________________________________   PHYSICAL EXAM:  VITAL SIGNS: ED Triage Vitals  Enc Vitals Group     BP 01/10/21 1414 (!) 155/70     Pulse Rate 01/10/21 1414 96     Resp 01/10/21 1414 16     Temp 01/10/21 1414 98.4 F (36.9 C)     Temp Source 01/10/21 1414 Oral     SpO2 01/10/21 1414 97 %     Weight 01/10/21 1415 130 lb (59 kg)     Height 01/10/21 1415 5' (1.524 m)     Head Circumference --      Peak Flow --      Pain Score 01/10/21 1415 7     Pain Loc --      Pain Edu? --      Excl. in [place]? --     Constitutional: Alert and oriented. Well appearing and in no acute distress. Eyes: Conjunctivae are normal. PERRL. EOMI. Head: Atraumatic. Nose: No congestion/rhinnorhea. Mouth/Throat: Mucous membranes are moist.  Oropharynx non-erythematous. Neck: No stridor.   Hematological/Lymphatic/Immunilogical: No cervical lymphadenopathy. Cardiovascular: Normal rate, regular rhythm. Grossly normal heart sounds.  Good peripheral circulation. Respiratory: Normal respiratory effort.  No retractions. Lungs diminished  gastrointestinal: Soft and nontender. No distention. No abdominal bruits. Hyperactive bowel sounds. Genitourinary:  Musculoskeletal: No lower extremity tenderness nor edema.  No joint effusions. Neurologic:  Normal speech and language. No gross focal neurologic deficits are appreciated. No gait instability. Skin:  Skin is warm, dry and intact. No rash noted. Psychiatric: Mood and affect are normal. Speech and behavior are normal.  ____________________________________________ Labs:  ____________________________________________  EKG  Not indicated. ____________________________________________  RADIOLOGY  ED MD interpretation:    Tubular structure arising from the cecum 83mm in thickness that is fluid-filled with adjacent fat stranding.  I, Sherrie George, personally viewed and evaluated these images (plain radiographs) as part of my medical decision making, as well as reviewing the written report by the radiologist.  Official radiology report(s):  CLINICAL DATA:  Diarrhea.  Abdominal pain.     EXAM:  CT ABDOMEN AND PELVIS WITH CONTRAST     TECHNIQUE:  Multidetector CT imaging of the abdomen and pelvis was performed  using the standard protocol following bolus administration of  intravenous contrast.     CONTRAST:  85mL OMNIPAQUE IOHEXOL 300 MG/ML  SOLN     COMPARISON:  Most recent abdominopelvic CT 04/24/2020     FINDINGS:  Lower chest: Unchanged 6 mm right lower lobe pulmonary nodule.  Unchanged 4 mm right middle lobe pulmonary nodule. No acute airspace  disease or pleural effusion.     Hepatobiliary: Diffuse hepatic low density typical of steatosis.  There is a stable 12 mm enhancing focus in the right lobe of the  liver, series 2, image 25 consistent with flash filling hemangioma  or AP shunt. No new hepatic lesion. Layering stones or sludge in the  gallbladder. There is a Phrygian cap. Equivocal gallbladder wall  thickening, series 2, image 31. No biliary  dilatation.     Pancreas: Mild fatty atrophy.  No ductal dilatation or inflammation.     Spleen: Normal in size without focal abnormality. Splenule at the  hilum.     Adrenals/Urinary Tract: Minimal right adrenal thickening. No  dominant adrenal mass. No hydronephrosis or perinephric edema. Mild  cortical scarring in the upper right kidney. No renal calculi or  focal lesion. Urinary bladder is physiologically distended without  wall thickening. Portions of the urinary bladder particularly in the  right are obscured by streak artifact from right hip arthroplasty.     Stomach/Bowel: The stomach is decompressed which limits detailed  gastric assessment. There is no small bowel obstruction or  inflammation. No obvious small bowel wall thickening.     Patient has history of appendectomy. There is a blind-ending  structure arising from the cecum coursing posteriorly, 2.3 cm in  length, 9 mm transverse and fluid-filled, series 2, image 52. There  is mild adjacent fat stranding. No enterolith.     There is submucosal fatty infiltration involving the cecum and  ascending colon. Formed stool in the transverse and descending  colon. Small amount of liquid stool in the sigmoid colon with  equivocal wall thickening, series 2, image 56.     Vascular/Lymphatic: Dense aortic atherosclerosis. No aortic  aneurysm. No acute aortic findings. Patent portal and splenic veins.  No portal venous gas. No enlarged lymph nodes in the abdomen or  pelvis, right pelvis assessment partially obscured by streak  artifact.     Reproductive: Possible atrophic uterus, pelvis partially obscured by  streak artifact. There is no left adnexal mass.     Other: No free air or free fluid. Tiny fat containing umbilical  hernia.     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 Right hip  arthroplasty. Diminished sclerosis involving the right pubic body  from prior exam. No acute osseous abnormalities are seen      IMPRESSION:  1. Patient has listed history of appendectomy. There is a 2.3 cm  tubular structure arising from the cecum measuring 9 mm in thickness  at  is fluid-filled with mild adjacent fat stranding. Differential  considerations include cecal diverticulitis or stump appendicitis.  2. Small amount of liquid stool in the sigmoid colon with equivocal  wall thickening, can be seen with diarrheal illness.  3. Hepatic steatosis. Stable flash filling hemangioma or AP shunt in  the right lobe of the liver.  4. Layering stones or sludge in the gallbladder with equivocal  gallbladder wall thickening. If there is clinical concern for acute  cholecystitis, recommend right upper quadrant ultrasound for further  evaluation.  5. Stable pulmonary nodules at the right lung base.      ____________________________________________   PROCEDURES  Procedure(s) performed (including Critical Care):  Procedures  ____________________________________________   INITIAL IMPRESSION / ASSESSMENT AND PLAN     75 year old female presenting to the emergency department for evaluation of diarrhea.  See HPI for further details.  Plan will be to screen for COVID, await remainder of lab studies, and get a CT of the abdomen pelvis.  DIFFERENTIAL DIAGNOSIS  C. difficile, gastroenteritis, diverticulitis  ED COURSE  Liver enzymes are markedly elevated.  Upon chart review it does not appear that they have ever been outside of the normal parameters.  CT does show some concerning findings.  Plan will be to admit her for further evaluation.  Patient is refusing admission. We discussed risk of death, worsening infection. Niece at bedside who has also encouraged her to stay. Patient asked reasoning for refusing to stay and she states that she "doesn't want to catch something she doesn't already have" while in the hospital. She was offered to be moved from the [facility] bed if that would make her feel reassured. She  declines and states there is nothing I'm going to be able to say that would make her stay.   Care relinquished to Dr. Delman Kitten who will also discuss admission with her.    ___________________________________________   FINAL CLINICAL IMPRESSION(S) / ED DIAGNOSES  Final diagnoses:  Diarrhea of presumed infectious origin  Transaminitis       Mariah [name] was evaluated in Emergency Department on 01/14/2021 for the symptoms described in the history of present illness. She was evaluated in the context of the global COVID-19 pandemic, which necessitated consideration that the patient might be at risk for infection with the SARS-CoV-2 virus that causes COVID-19. Institutional protocols and algorithms that pertain to the evaluation of patients at risk for COVID-19 are in a state of rapid change based on information released by regulatory bodies including the CDC and federal and state organizations. These policies and algorithms were followed during the patient's care in the ED.   Note:  This document was prepared using Dragon voice recognition software and may include unintentional dictation errors.    Victorino Dike, FNP 01/14/21 1742    Delman Kitten, MD 01/17/21 [phone]

## 2021-01-10 NOTE — Chronic Care Management (AMB) (Signed)
  Chronic Care Management     01/10/2021 Name: Mariah [name] MRN: [id number] DOB: [birth year]  Primary Care Provider: [name], MD Reason for referral : Chronic Care Management   Mariah [name] is a 75 y.o. year old female who is a primary care patient of Bacigalupo, Dionne Bucy, MD. A telephonic outreach was attempted today with her niece/caregiver, Nariah. [name] reports that [name] has been experiencing loose black tarry stools for the past few days. She contacted the clinic on yesterday and was advised to report to the Emergency Department for further evaluation. [name] reports preparing to leave the home to take her to the Emergency Department at the time of the call. Agreeable to outreach after [name] has been evaluated.     PLAN [name] will be evaluated in the Emergency Department today. A member of the care management team will follow up next week.    [name] [phone]

## 2021-01-10 NOTE — H&P (Signed)
History and Physical    PLEASE NOTE THAT DRAGON DICTATION SOFTWARE WAS USED IN THE CONSTRUCTION OF THIS NOTE.   Mariah [name] LFY:[id number] DOB: 1946/05/16 DOA: 01/10/2021  PCP: [name], MD Patient coming from: home   I have personally briefly reviewed patient's old medical records in [place]  Chief Complaint: Diarrhea  HPI: Mariah [name] is a 75 y.o. female with medical history significant for COPD, GERD, hyperlipidemia, psoriasis on Cosentyx and methotrexate, chronic diastolic heart failure, who is admitted to [facility] on 01/10/2021 for further evaluation and management of acute transaminitis at presenting from home to [facility] ED complaining of diarrhea.   The patient reports 4 to 5 days loose watery stools at a frequency of 3-5 episodes per day over that timeframe.  She denies any associated abdominal pain.  No melena or hematochezia.  No recent trauma or travel.  Denies any associated nausea/vomiting.  She also denies any associated subjective fever, chills, rigors, or generalized myalgias.  No recent headache, neck stiffness, rhinitis, rhinorrhea, sore throat, shortness of breath, wheezing, cough, rash.  Denies any recent dysuria, gross hematuria, or change in urinary urgency/frequency.  No recent chest pain, diaphoresis, palpitations, dizziness, presyncope, or syncope.  In terms of alcohol consumption, the patient reports that this is limited to rare consumption of a glass of wine, at a frequency of less than 1 glass of wine per month, without any recent increase in frequency of alcohol consumption relative to this reported baseline.  Denies any use of recreational drugs.  No recent unintentional weight loss or jaundice.  The patient confirms a history of GERD, for which she is on Protonix twice daily as an outpatient.  Additionally, she confirms history of hyperlipidemia, for which she is on simvastatin at home.  While she confirms being on both  these medications, she is unsure as to how long she has been taking them.  Medical history also notable for psoriasis, for which she is on Cosentyx as well as methotrexate.  Medical history also notable for history of chronic diastolic heart failure, most recent echocardiogram performed in March 2022 showing LVEF 55 to 60% without evidence of focal wall motion abnormalities, and also showing evidence of grade 1 diastolic dysfunction in the absence of any evidence of significant valvular pathology.  Outpatient diuretic regimen consists of Lasix 40 mg p.o. daily.  Patient denies any recent shortness of breath, orthopnea, PND, or recent development of peripheral edema, and notes that she has continued to take her home Lasix in spite of recent development of diarrhea, as above.  Per chart review, the patient has a mild, chronically elevated alkaline phosphatase level of between 95-2 40 dating back to 2016, with most recent prior value noted to be 135 in February 2022.  However, in reviewing her labs back through 2016, she has not previously had elevation of her AST, ALT, or total bilirubin, with most recent corresponding values in February 2022 found to be 21, 20, and 0.7.  Additionally, per chart review, the patient has a history of interim leukopenia dating back to November 2021.      ED Course:  Vital signs in the ED were notable for the following: Tetramex 98.5, heart rate initially noted to be 96, with interval improvement to 80 following IV fluid administration, as further quantified below; blood pressure 116/66 -154/71; respiratory rate 16-18, oxygen saturation 96 to 98% on room air.  Labs were notable for the following: CMP notable for the following:  Sodium 136, potassium 3.4, bicarbonate 29, anion gap 7, creatinine 1.03 relative to baseline creatinine of 0.9-1.2, albumin 2.8, alkaline phosphatase 226, AST 150, ALT 88, total bilirubin 2.5.  Lipase 26.  CBC notable for white blood cell count of 3600  relative to most recent prior value of 7800 on 10/10/2020, hemoglobin 16.5.  Urinalysis showed no white blood cells, nitrate negative, and leukocyte Estrace negative.  Screening nasopharyngeal COVID-19 / influenza PCR was performed in the ED this evening and found to be negative.  CT abdomen/pelvis with contrast demonstrated the following: Fluid-filled tubular structure arising from the cecum measuring 9 mm with adjacent fat stranding, potentially representing cecal diverticulitis or stump appendicitis in the setting of a documented history of appendectomy; sigmoid colon wall thickening, which was felt to be consistent with diarrheal illness; layering stones/sludge in the gallbladder with equivocal gallbladder wall thickening, without evidence of pericholecystic fluid, and in the absence of common bile duct dilation or overt choledocholithiasis.  While in the ED, the following were administered: Zosyn; 500 cc normal saline bolus.     Review of Systems: As per HPI otherwise 10 point review of systems negative.   Past Medical History:  Diagnosis Date   Abdominal aortic aneurysm (AAA) ([place])    Arthritis    CAP (community acquired pneumonia) 06/03/2020   COPD (chronic obstructive pulmonary disease) (HCC)    GERD (gastroesophageal reflux disease)    Headache    sinus   History of adenomatous polyp of colon    Hyperlipidemia    Hypertension    Legionella infection ([place]) 07/17/2020   Psoriasis    Right lumbar radiculitis    Vitamin D deficiency     Past Surgical History:  Procedure Laterality Date   APPENDECTOMY  2000   BREAST BIOPSY Right 09/2016   radial scar;COMPLEX SCLEROSING LESION   BREAST LUMPECTOMY Right 11/24/2016   COMPLEX SCLEROSING LESION   BREAST LUMPECTOMY WITH NEEDLE LOCALIZATION Right 11/24/2016   Procedure: BREAST LUMPECTOMY WITH NEEDLE LOCALIZATION;  Surgeon: Leonie Green, MD;  Location: [facility] ORS;  Service: General;  Laterality: Right;   COLONOSCOPY WITH PROPOFOL  N/A 08/10/2016   Procedure: COLONOSCOPY WITH PROPOFOL;  Surgeon: Manya Silvas, MD;  Location: [place];  Service: Endoscopy;  Laterality: N/A;   ESOPHAGOGASTRODUODENOSCOPY (EGD) WITH PROPOFOL N/A 08/10/2016   Procedure: ESOPHAGOGASTRODUODENOSCOPY (EGD) WITH PROPOFOL;  Surgeon: Manya Silvas, MD;  Location: [facility] ENDOSCOPY;  Service: Endoscopy;  Laterality: N/A;   FOOT SURGERY Bilateral    JOINT REPLACEMENT Right 01/27/2016   hip   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 She has a 60.00 pack-year smoking history. She has never used smokeless tobacco. She reports current alcohol use. She reports that she does not use drugs.   Allergies  Allergen Reactions   Amoxicillin Diarrhea   Clindamycin/Lincomycin     diarrhea    Family History  Problem Relation Age of Onset   Cancer Mother        kidney   Arthritis Father    Heart disease Father    COPD Sister    Fibromyalgia Sister    Osteoarthritis Sister    Osteoarthritis Maternal Aunt    Colon cancer Paternal Aunt    Colon cancer Paternal Uncle    Colon cancer  Paternal Grandmother    Breast cancer Neg Hx     Family history reviewed and not pertinent    Prior to Admission medications   Medication Sig Start Date End Date Taking? Authorizing Provider  ciprofloxacin (CIPRO) 500 MG tablet Take 1 tablet (500 mg total) by mouth 2 (two) times daily for 7 days. 01/10/21 01/17/21 Yes Triplett, Cari B, FNP  metroNIDAZOLE (FLAGYL) 500 MG tablet Take 1 tablet (500 mg total) by mouth 3 (three) times daily for 7 days. 01/10/21 01/17/21 Yes Triplett, Cari B, FNP  albuterol (VENTOLIN HFA) 108 (90 Base) MCG/ACT inhaler INHALE 1-2 PUFFS INTO THE LUNGS EVERY 6 HOURS AS NEEDED FOR WHEEZING OR SHORTNESS OF BREATH. 05/22/20   Carles Collet M, PA-C  apixaban (ELIQUIS) 5 MG TABS tablet Take 1 tablet (5 mg total) by mouth 2 (two) times daily. Patient not taking: Reported on 11/18/2020 06/09/20   Nita Sells, MD  betamethasone dipropionate (DIPROLENE) 0.05 % ointment Apply topically daily. 01/01/20   Ralene Bathe, MD  BREO ELLIPTA 100-25 MCG/INH AEPB TAKE 1 PUFF BY MOUTH EVERY DAY 09/01/20   [name], MD  calcipotriene (DOVONOX) 0.005 % cream APPLY TO SKIN ONCE A DAY APPLY DAILY ON HANDS, FEET AND WRIST X 2 WEEKS, THEN USE 5 DAYS A WEEK 01/01/20   Ralene Bathe, MD  Calcipotriene 0.005 % solution APPLY 1 A SMALL AMOUNT TO SCALP ONCE A DAY APPLY TO SCALP DAILY FOR 2 WEEKS, THEN USE ON SCALP 5 DAYS A WEEK 09/02/20   Ralene Bathe, MD  Cholecalciferol (VITAMIN D3) 2000 units TABS Take 2,000 Units by mouth daily.    [provider]  clobetasol (TEMOVATE) 0.05 % external solution APPLY TO AFFECTED AREA TWICE A DAY 09/01/20   Bacigalupo, Dionne Bucy, MD  COSENTYX SENSOREADY, 300 MG, 150 MG/ML SOAJ Inject 2 Syringes into the skin every 28 (twenty-eight) days. 05/31/20   [provider]  donepezil (ARICEPT) 10 MG tablet Take 10 mg by mouth daily. 07/01/20   [provider]  ferrous sulfate 325 (65 FE) MG tablet Take 325 mg by mouth 2 (two) times daily. 05/28/20   [provider]  folic acid (FOLVITE) 1 MG tablet Take 1 mg by mouth daily. 09/02/20   [provider]  furosemide (LASIX) 40 MG tablet Take 1 tablet (40 mg total) by mouth daily. If weight 127 lb or less, take 20 mg by mouth daily. 07/01/20   Minna Merritts, MD  gabapentin (NEURONTIN) 300 MG capsule Take 1 capsule (300 mg total) by mouth 2 (two) times daily. 10/08/20   [name], MD  levocetirizine (XYZAL) 5 MG tablet TAKE 1 TABLET BY MOUTH EVERY DAY IN THE EVENING 12/18/20   Bacigalupo, Dionne Bucy, MD  methotrexate (RHEUMATREX) 2.5 MG tablet Take 15 mg by mouth once a week.  05/23/20   [provider]  metoprolol succinate (TOPROL-XL) 50 MG 24 hr tablet TAKE 1 TABLET BY MOUTH DAILY. TAKE WITH OR IMMEDIATELY FOLLOWING A MEAL. 11/25/20   Bacigalupo, Dionne Bucy, MD  pantoprazole (PROTONIX) 20 MG tablet TAKE 1 TABLET (20 MG TOTAL) BY MOUTH 2 (TWO) TIMES DAILY BEFORE A MEAL. 06/26/20   Bacigalupo, Dionne Bucy, MD  potassium chloride SA (KLOR-CON) 20 MEQ tablet Take 2 tablets (40 mEq total) by mouth daily. 06/06/20   Nita Sells, MD  rOPINIRole (REQUIP) 1 MG tablet TAKE 1 TABLET BY MOUTH AT BEDTIME. 01/02/21   Bacigalupo, Dionne Bucy, MD  simvastatin (ZOCOR) 20 MG  tablet TAKE 1 TABLET BY MOUTH EVERY DAY Patient taking differently: Take 20 mg by mouth daily at 6 PM. 09/04/20   Bacigalupo, Dionne Bucy, MD  traMADol (ULTRAM) 50 MG tablet TAKE 1 TABLET BY MOUTH EVERY 6 HOURS AS NEEDED FOR MODERATE OR SEVERE PAIN 12/18/20   [name], MD     Objective    Physical Exam: Vitals:   01/10/21 1415 01/10/21 1645 01/10/21 1807 01/10/21 2139  BP:  (!) 154/71 (!) 157/74 116/66  Pulse:  90 91 80  Resp:  17 17 18   Temp:  98.5 F (36.9 C) 98.5 F (36.9 C)   TempSrc:  Oral Oral   SpO2:  98% 96% 98%  Weight: 59 kg     Height: 5' (1.524 m)       General: appears to be stated age; alert, oriented Skin: warm, dry, no rash Head:  AT/[place] Mouth:  Oral mucosa membranes appear dry, normal dentition Neck: supple; trachea midline Heart:  RRR; did not appreciate any M/R/G Lungs: CTAB, did not appreciate any wheezes, rales, or rhonchi Abdomen: + BS; soft, ND, NT Vascular: 2+ pedal pulses b/l; 2+ radial pulses b/l Extremities: no peripheral edema, no muscle wasting Neuro: strength and sensation intact in upper and lower extremities b/l    Labs on Admission: I have personally reviewed following labs and imaging studies  CBC: Recent Labs  Lab 01/10/21 1653  WBC 3.6*  NEUTROABS 2.3  HGB 16.5*  HCT 48.5*  MCV 93.3  PLT 631*   Basic Metabolic Panel: Recent Labs  Lab 01/10/21 1653   NA 136  K 3.4*  CL 100  CO2 29  GLUCOSE 99  BUN 10  CREATININE 1.03*  CALCIUM 8.4*   GFR: Estimated Creatinine Clearance: 37.3 mL/min (A) (by C-G formula based on SCr of 1.03 mg/dL (H)). Liver Function Tests: Recent Labs  Lab 01/10/21 1653  AST 150*  ALT 88*  ALKPHOS 226*  BILITOT 2.5*  PROT 5.9*  ALBUMIN 2.8*   Recent Labs  Lab 01/10/21 1653  LIPASE 26   No results for input(s): AMMONIA in the last 168 hours. Coagulation Profile: No results for input(s): INR, PROTIME in the last 168 hours. Cardiac Enzymes: No results for input(s): CKTOTAL, CKMB, CKMBINDEX, TROPONINI in the last 168 hours. BNP (last 3 results) No results for input(s): PROBNP in the last 8760 hours. HbA1C: No results for input(s): HGBA1C in the last 72 hours. CBG: No results for input(s): GLUCAP in the last 168 hours. Lipid Profile: No results for input(s): CHOL, HDL, LDLCALC, TRIG, CHOLHDL, LDLDIRECT in the last 72 hours. Thyroid Function Tests: No results for input(s): TSH, T4TOTAL, FREET4, T3FREE, THYROIDAB in the last 72 hours. Anemia Panel: No results for input(s): VITAMINB12, FOLATE, FERRITIN, TIBC, IRON, RETICCTPCT in the last 72 hours. Urine analysis:    Component Value Date/Time   COLORURINE YELLOW (A) 01/10/2021 1820   APPEARANCEUR CLEAR (A) 01/10/2021 1820   LABSPEC 1.006 01/10/2021 1820   PHURINE 5.0 01/10/2021 1820   GLUCOSEU NEGATIVE 01/10/2021 1820   HGBUR NEGATIVE 01/10/2021 1820   BILIRUBINUR NEGATIVE 01/10/2021 1820   BILIRUBINUR Negative 03/04/2020 0931   KETONESUR NEGATIVE 01/10/2021 1820   PROTEINUR NEGATIVE 01/10/2021 1820   UROBILINOGEN 0.2 03/04/2020 0931   NITRITE NEGATIVE 01/10/2021 1820   LEUKOCYTESUR NEGATIVE 01/10/2021 1820    Radiological Exams on Admission: CT ABDOMEN PELVIS W CONTRAST  Result Date: 01/10/2021 CLINICAL DATA:  Diarrhea.  Abdominal pain. EXAM: CT ABDOMEN AND PELVIS WITH CONTRAST TECHNIQUE: Multidetector CT  imaging of the abdomen and pelvis  was performed using the standard protocol following bolus administration of intravenous contrast. CONTRAST:  99mL OMNIPAQUE IOHEXOL 300 MG/ML  SOLN COMPARISON:  Most recent abdominopelvic CT 04/24/2020 FINDINGS: Lower chest: Unchanged 6 mm right lower lobe pulmonary nodule. Unchanged 4 mm right middle lobe pulmonary nodule. No acute airspace disease or pleural effusion. Hepatobiliary: Diffuse hepatic low density typical of steatosis. There is a stable 12 mm enhancing focus in the right lobe of the liver, series 2, image 25 consistent with flash filling hemangioma or AP shunt. No new hepatic lesion. Layering stones or sludge in the gallbladder. There is a Phrygian cap. Equivocal gallbladder wall thickening, series 2, image 31. No biliary dilatation. Pancreas: Mild fatty atrophy.  No ductal dilatation or inflammation. Spleen: Normal in size without focal abnormality. Splenule at the hilum. Adrenals/Urinary Tract: Minimal right adrenal thickening. No dominant adrenal mass. No hydronephrosis or perinephric edema. Mild cortical scarring in the upper right kidney. No renal calculi or focal lesion. Urinary bladder is physiologically distended without wall thickening. Portions of the urinary bladder particularly in the right are obscured by streak artifact from right hip arthroplasty. Stomach/Bowel: The stomach is decompressed which limits detailed gastric assessment. There is no small bowel obstruction or inflammation. No obvious small bowel wall thickening. Patient has history of appendectomy. There is a blind-ending structure arising from the cecum coursing posteriorly, 2.3 cm in length, 9 mm transverse and fluid-filled, series 2, image 52. There is mild adjacent fat stranding. No enterolith. There is submucosal fatty infiltration involving the cecum and ascending colon. Formed stool in the transverse and descending colon. Small amount of liquid stool in the sigmoid colon with equivocal wall thickening, series 2, image  56. Vascular/Lymphatic: Dense aortic atherosclerosis. No aortic aneurysm. No acute aortic findings. Patent portal and splenic veins. No portal venous gas. No enlarged lymph nodes in the abdomen or pelvis, right pelvis assessment partially obscured by streak artifact. Reproductive: Possible atrophic uterus, pelvis partially obscured by streak artifact. There is no left adnexal mass. Other: No free air or free fluid. Tiny fat containing umbilical hernia. 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 Right hip arthroplasty. Diminished sclerosis involving the right pubic body from prior exam. No acute osseous abnormalities are seen IMPRESSION: 1. Patient has listed history of appendectomy. There is a 2.3 cm tubular structure arising from the cecum measuring 9 mm in thickness at is fluid-filled with mild adjacent fat stranding. Differential considerations include cecal diverticulitis or stump appendicitis. 2. Small amount of liquid stool in the sigmoid colon with equivocal wall thickening, can be seen with diarrheal illness. 3. Hepatic steatosis. Stable flash filling hemangioma or AP shunt in the right lobe of the liver. 4. Layering stones or sludge in the gallbladder with equivocal gallbladder wall thickening. If there is clinical concern for acute cholecystitis, recommend right upper quadrant ultrasound for further evaluation. 5. Stable pulmonary nodules at the right lung base. Aortic Atherosclerosis (ICD10-I70.0). Electronically Signed   By: Keith Rake M.D.   On: 01/10/2021 20:09      Assessment/Plan   MARGRETTA ZAMORANO is a 75 y.o. female with medical history significant for COPD, GERD, hyperlipidemia, psoriasis on Cosentyx and methotrexate, chronic diastolic heart failure, who is admitted to [facility] on 01/10/2021 for further evaluation and management of acute transaminitis at presenting from home to [facility] ED complaining of diarrhea.   Principal Problem:    Transaminitis Active Problems:   Essential (primary) hypertension   Hypercholesteremia  Chronic diastolic CHF (congestive heart failure) (HCC)   Hypokalemia   Diarrhea   COPD (chronic obstructive pulmonary disease) (HCC)   Tobacco abuse      #) Acute transaminitis: In the context of chronically elevated alkaline phosphatase levels dating back to 2016 with a high value of 240 over that time, but no prior evidence of elevation of AST, ALT, or total bilirubin, presenting labs this evening notable for alkaline phosphatase 226, AST 150, ALT 88, and total bilirubin 2.5.  Associated etiology currently unclear.  However, in the setting of recent development of 3-5 episodes of daily loose watery stool over the last few days, will check acute viral hepatitis panel.  While in alleging that presentation does not appear to be associated with any acute abdominal pain, CT abdomen/pelvis demonstrates layering stones/sludge in the gallbladder with equivocal gallbladder wall thickening, raising the possibility of acute cholecystitis.  Of note imaging demonstrates no associated evidence of common bile duct dilation or radiopaque choledocholithiasis, and clinically, presentation not consistent with a sending cholangitis.  However, we will further evaluate the possibility of acute cholecystitis with raver quadrant ultrasound, which has been ordered for the morning.  We will also further evaluate borderline cholestatic laboratory pattern by correlating with GGT as well as direct bilirubin level, as well as repeat CMP in the morning.  We will use this additional laboratory evaluation, including interval trend in liver enzymes and result of ensuing ultrasound the right upper quadrant to assist with guidance and evaluating the necessity of GI consultation.  Of note, lipase not elevated, and this evening CT scan showed no evidence of findings suggestive of acute pancreatitis.  Additionally, patient denies any history of  alcohol abuse or recent alcohol consumption.  In setting of mildly elevated hemoglobin of 16.5, which may well be secondary erythrocytosis as a consequence of chronic tobacco abuse, will also check ferritin level.  Unclear longevity of statin use as an outpatient.  Plan: Check GGT and direct bilirubin, as above.  Repeat CMP in the morning.  Check acute viral hepatitis panel, urinary drug screen, serum ethanol level and ferritin.  Right upper quadrant abdominal ultrasound is been ordered for the morning.  Hold home statin.  Repeat CBC in the morning.      #) Acute Diarrhea: The patient reports 3-5 daily episodes of loose watery , non-bloody stool over the last 3 to 4 days, thereby meeting frequency and duration of definition for acute diarrhea.  Not associate with any nominal pain, nausea, or vomiting, thereby reducing likelihood of associated gastroenteritis.  The patient denies any recent antibiotic use, but he is on Protonix as an outpatient, which is a risk factor for development of C. difficile colitis given associated heightened risk for translocation of bacteria.  Therefore, will check for C. difficile PCR.  Presentation not associate with any recent travel or sick contacts and COVID-19 PCR found to be negative when checked in the ED today.  Additionally, given that the patient is chronically immunocompromised as a consequence of her immunosuppressive Cosentyx/methotrexate in the context of underlying psoriasis, and given the possibility of cecal diverticulitis per findings on CT abdomen/pelvis, which also include potential colonic wall thickening of the sigmoid colon, will continue IV antibiotics and follow for result of stool GI panel by PCR.  Given that differential includes cecal diverticulitis, keep the patient n.p.o. for now while pursuing IV ciprofloxacin and IV Flagyl, representing a change from the IV Zosyn that was initiated in the ED this evening.  Differential includes potential  pharmacologic contribution in the setting of outpatient Protonix, although the duration of such use is currently unclear to me.  Presentation and radiographic findings less suggestive of ischemic versus inflammatory colitis.    Plan: Follow for results of stool studies, as above, including that of C. difficile PCR as well as GI panel by PCR. Will refrain from use of anti-motility agents until underlying infectious source as been ruled-out.  IV Flagyl/ciprofloxacin, as further detailed above.  Will receive approximately 400 cc of IV fluids overnight corresponding with IV potassium chloride supplementation, as further detailed below, plan to reassess volume status in the morning, particularly given the patient's documented history of chronic diastolic heart failure.  Further evaluation and management of acute transaminitis, as above.  Repeat CMP and CBC in the morning.  Check TSH.  Hold home Protonix.      #) Hypokalemia: Presenting serum potassium level found to be mildly low at 3.4.   Plan: Potassium chloride for milliequivalents IV over 4 hours x 1 dose now.  Add on serum magnesium level, and repeat CMP in the morning.        #) COPD: Documented history of such in the context of being a current smoker.  Outpatient respiratory regimen consists of Breo Ellipta as well as as needed albuterol inhaler.  No clinical evidence to suggest acutely decompensated heart failure at this time.    Plan: Continue home [facility] as well as as needed albuterol inhaler.  Counseled patient on importance of complete smoking discontinuation.      #) Chronic tobacco abuse: The patient confirms that she is a current smoker, and smoked approximately 1.5 packs/day over the last 40 years.  Plan: Particularly in the setting of documented history of COPD, counseled the patient on the importance of complete smoking discontinuation.       #) GERD: On Protonix as an outpatient.    Plan: In the setting of  presenting diarrhea, will hold home PPI for now.         #) Essential hypertension: Outpatient antihypertensive regimen appears limited to Toprol-XL.  Plan: Resume home beta-blocker.  Close monitoring of ensuing blood pressure via routine vital signs.       #) Hyperlipidemia: On simvastatin as an outpatient.   Plan: In the setting of presenting acute transaminitis, will hold home statin for now, as further detailed above.       #) Chronic diastolic heart failure: Documented history of such, with most recent echocardiogram occurring in March 2022, with additional details as conveyed above.  Outpatient diuretic regimen consists of Lasix 40 mg p.o. daily.  No clinical evidence suggest acute decompensated heart failure at this time.  As the patient appears mildly intravascularly depleted at this time in the setting of several days of diarrhea, as above, will hold home scheduled Lasix for now, and closely monitor ensuing volume status and response to plan for 400 cc of IV fluid administered overnight via IV potassium supplementation, as above.  Plan: Monitor strict I's and O's and daily weights.  Hold home Lasix for now, as above.  Repeat CMP in the morning and check serum magnesium level, as above.      DVT prophylaxis: SCDs Code Status: Full code Family Communication: none Disposition Plan: Per Rounding Team Consults called: none  Admission status: Inpatient; MedSurg     Of note, this patient was added by me to the following Admit List/Treatment Team: [facility].      PLEASE NOTE THAT DRAGON DICTATION SOFTWARE WAS USED  IN THE CONSTRUCTION OF THIS NOTE.   Brandon [facility] Pager [phone] From [place]  Otherwise, please contact night-coverage  [email or web address] Password [facility]   01/10/2021, 9:47 PM

## 2021-01-10 NOTE — Telephone Encounter (Signed)
Spoke with Patient's niece and advised her that per Dr. Jacinto Reap, she needs to go to the ER for evaluation. Patient's niece verbally understands.

## 2021-01-10 NOTE — ED Provider Notes (Signed)
Medical screening examination/treatment/procedure(s) were conducted as a shared visit with non-physician practitioner(s) and myself.  I personally evaluated the patient during the encounter.   ----------------------------------------- 9:24 PM on 01/10/2021 -----------------------------------------   Admission discussed with Dr. Eugenia Pancoast  I will initiate Zosyn for suspected diverticulitis in the setting of loose stools and inflammatory changes as noted on her CT scan.  Stump appendicitis also consideration.  With regard to her LFTs hospitalist advises plans to check hepatitis panel which seems quite reasonable as well.  Patient will be monitored, observed for improvement, anticipate recheck of labs including LFTs etc. tomorrow.  Patient and her family who are at the bedside are agreeable and understanding of the plan for admission.  Patient meets sepsis criteria with white blood cell count leukopenia, heart rate greater than 90, source of infection   Delman Kitten, MD 01/10/21 2130

## 2021-01-10 NOTE — Consult Note (Signed)
PHARMACY -  BRIEF ANTIBIOTIC NOTE   Pharmacy has received consult(s) for Zosyn from an ED provider.  The patient's profile has been reviewed for ht/wt/allergies/indication/available labs.    One time order(s) placed for  Zosyn 3.375 g  Further antibiotics/pharmacy consults should be ordered by admitting physician if indicated.                       Thank you, Dorothe Pea, PharmD, BCPS Clinical Pharmacist   01/10/2021  9:50 PM

## 2021-01-11 ENCOUNTER — Inpatient Hospital Stay: Payer: PPO

## 2021-01-11 DIAGNOSIS — R197 Diarrhea, unspecified: Secondary | ICD-10-CM

## 2021-01-11 DIAGNOSIS — Z720 Tobacco use: Secondary | ICD-10-CM | POA: Diagnosis present

## 2021-01-11 DIAGNOSIS — E876 Hypokalemia: Secondary | ICD-10-CM

## 2021-01-11 DIAGNOSIS — J449 Chronic obstructive pulmonary disease, unspecified: Secondary | ICD-10-CM | POA: Diagnosis present

## 2021-01-11 DIAGNOSIS — I5032 Chronic diastolic (congestive) heart failure: Secondary | ICD-10-CM

## 2021-01-11 DIAGNOSIS — E7800 Pure hypercholesterolemia, unspecified: Secondary | ICD-10-CM

## 2021-01-11 DIAGNOSIS — I10 Essential (primary) hypertension: Secondary | ICD-10-CM

## 2021-01-11 DIAGNOSIS — R7401 Elevation of levels of liver transaminase levels: Secondary | ICD-10-CM | POA: Diagnosis present

## 2021-01-11 LAB — COMPREHENSIVE METABOLIC PANEL
ALT: 74 U/L — ABNORMAL HIGH (ref 0–44)
AST: 117 U/L — ABNORMAL HIGH (ref 15–41)
Albumin: 2.6 g/dL — ABNORMAL LOW (ref 3.5–5.0)
Alkaline Phosphatase: 204 U/L — ABNORMAL HIGH (ref 38–126)
Anion gap: 6 (ref 5–15)
BUN: 10 mg/dL (ref 8–23)
CO2: 26 mmol/L (ref 22–32)
Calcium: 8.4 mg/dL — ABNORMAL LOW (ref 8.9–10.3)
Chloride: 104 mmol/L (ref 98–111)
Creatinine, Ser: 1.02 mg/dL — ABNORMAL HIGH (ref 0.44–1.00)
GFR, Estimated: 57 mL/min — ABNORMAL LOW (ref >60)
Glucose, Bld: 84 mg/dL (ref 70–99)
Potassium: 3.8 mmol/L (ref 3.5–5.1)
Sodium: 136 mmol/L (ref 135–145)
Total Bilirubin: 2.2 mg/dL — ABNORMAL HIGH (ref 0.3–1.2)
Total Protein: 5.3 g/dL — ABNORMAL LOW (ref 6.5–8.1)

## 2021-01-11 LAB — TSH: TSH: 2.06 u[IU]/mL (ref 0.350–4.500)

## 2021-01-11 LAB — C DIFFICILE QUICK SCREEN W PCR REFLEX
C Diff antigen: NEGATIVE
C Diff interpretation: NOT DETECTED
C Diff toxin: NEGATIVE

## 2021-01-11 LAB — CBC WITH DIFFERENTIAL/PLATELET
Abs Immature Granulocytes: 0.01 10*3/uL (ref 0.00–0.07)
Basophils Absolute: 0 10*3/uL (ref 0.0–0.1)
Basophils Relative: 0 %
Eosinophils Absolute: 0.2 10*3/uL (ref 0.0–0.5)
Eosinophils Relative: 6 %
HCT: 41.4 % (ref 36.0–46.0)
Hemoglobin: 14.3 g/dL (ref 12.0–15.0)
Immature Granulocytes: 0 %
Lymphocytes Relative: 22 %
Lymphs Abs: 0.7 10*3/uL (ref 0.7–4.0)
MCH: 31.4 pg (ref 26.0–34.0)
MCHC: 34.5 g/dL (ref 30.0–36.0)
MCV: 91 fL (ref 80.0–100.0)
Monocytes Absolute: 0.3 10*3/uL (ref 0.1–1.0)
Monocytes Relative: 10 %
Neutro Abs: 2 10*3/uL (ref 1.7–7.7)
Neutrophils Relative %: 62 %
Platelets: 89 10*3/uL — ABNORMAL LOW (ref 150–400)
RBC: 4.55 MIL/uL (ref 3.87–5.11)
RDW: 13.9 % (ref 11.5–15.5)
WBC: 3.2 10*3/uL — ABNORMAL LOW (ref 4.0–10.5)
nRBC: 0 % (ref 0.0–0.2)

## 2021-01-11 LAB — HEPATITIS PANEL, ACUTE
HCV Ab: NONREACTIVE
Hep A IgM: NONREACTIVE
Hep B C IgM: NONREACTIVE
Hepatitis B Surface Ag: NONREACTIVE

## 2021-01-11 LAB — URINE DRUG SCREEN, QUALITATIVE (ARMC ONLY)
Amphetamines, Ur Screen: NOT DETECTED
Barbiturates, Ur Screen: NOT DETECTED
Benzodiazepine, Ur Scrn: NOT DETECTED
Cannabinoid 50 Ng, Ur ~~LOC~~: NOT DETECTED
Cocaine Metabolite,Ur ~~LOC~~: NOT DETECTED
MDMA (Ecstasy)Ur Screen: NOT DETECTED
Methadone Scn, Ur: NOT DETECTED
Opiate, Ur Screen: NOT DETECTED
Phencyclidine (PCP) Ur S: NOT DETECTED
Tricyclic, Ur Screen: NOT DETECTED

## 2021-01-11 LAB — GASTROINTESTINAL PANEL BY PCR, STOOL (REPLACES STOOL CULTURE)

## 2021-01-11 LAB — ETHANOL: Alcohol, Ethyl (B): <10 mg/dL (ref <10)

## 2021-01-11 LAB — BILIRUBIN, DIRECT: Bilirubin, Direct: 0.9 mg/dL — ABNORMAL HIGH (ref 0.0–0.2)

## 2021-01-11 LAB — MAGNESIUM: Magnesium: 1.9 mg/dL (ref 1.7–2.4)

## 2021-01-11 LAB — PHOSPHORUS: Phosphorus: 3.1 mg/dL (ref 2.5–4.6)

## 2021-01-11 LAB — GAMMA GT: GGT: 160 U/L — ABNORMAL HIGH (ref 7–50)

## 2021-01-11 MED ORDER — CETIRIZINE HCL 10 MG PO TABS
5.0000 mg | ORAL_TABLET | Freq: Every evening | ORAL | Status: DC
  Administered 2021-01-11 – 2021-01-13 (×3): 5 mg via ORAL
  Filled 2021-01-11 (×4): qty 1

## 2021-01-11 MED ORDER — TRAMADOL HCL 50 MG PO TABS
50.0000 mg | ORAL_TABLET | Freq: Four times a day (QID) | ORAL | Status: DC | PRN
  Administered 2021-01-12 (×2): 50 mg via ORAL
  Filled 2021-01-11 (×2): qty 1

## 2021-01-11 MED ORDER — SODIUM CHLORIDE 0.9 % IV SOLN: INTRAVENOUS | Status: AC

## 2021-01-11 MED ORDER — FOLIC ACID 1 MG PO TABS
1.0000 mg | ORAL_TABLET | Freq: Every day | ORAL | Status: DC
  Administered 2021-01-11 – 2021-01-14 (×4): 1 mg via ORAL
  Filled 2021-01-11 (×4): qty 1

## 2021-01-11 MED ORDER — GABAPENTIN 300 MG PO CAPS
300.0000 mg | ORAL_CAPSULE | Freq: Two times a day (BID) | ORAL | Status: DC
  Administered 2021-01-11 – 2021-01-14 (×7): 300 mg via ORAL
  Filled 2021-01-11 (×7): qty 1

## 2021-01-11 MED ORDER — FAMOTIDINE 20 MG PO TABS
40.0000 mg | ORAL_TABLET | Freq: Every day | ORAL | Status: DC
  Administered 2021-01-11 – 2021-01-14 (×4): 40 mg via ORAL
  Filled 2021-01-11 (×4): qty 2

## 2021-01-11 MED ORDER — FERROUS SULFATE 325 (65 FE) MG PO TABS
325.0000 mg | ORAL_TABLET | Freq: Two times a day (BID) | ORAL | Status: DC
  Administered 2021-01-11 – 2021-01-14 (×7): 325 mg via ORAL
  Filled 2021-01-11 (×7): qty 1

## 2021-01-11 MED ORDER — DONEPEZIL HCL 5 MG PO TABS
10.0000 mg | ORAL_TABLET | Freq: Every day | ORAL | Status: DC
  Administered 2021-01-11 – 2021-01-14 (×4): 10 mg via ORAL
  Filled 2021-01-11 (×5): qty 2

## 2021-01-11 NOTE — Progress Notes (Signed)
PROGRESS NOTE    Mariah [name]  YPP:[id number] DOB: [birth year] DOA: 01/10/2021 PCP: [name], MD (Confirm with patient/family/NH records and if not entered, this HAS to be entered at [facility] point of entry. "No PCP" if truly none.)   Chief Complaint  Patient presents with   Diarrhea    Brief Narrative:  Patient 75 year old female history of COPD, GERD, hyperlipidemia, psoriasis on Cosentyx and methotrexate, chronic diastolic heart failure presented to ED for evaluation for diarrhea and noted on presentation to have a acute transaminitis.  CT abdomen and pelvis done concerning for a cecal diverticulitis versus stump appendicitis.  Also concerning was cholelithiasis.  Abdominal ultrasound done to rule out acute cholecystitis.  Acute hepatitis panel pending.  Patient placed empirically on IV ciprofloxacin and IV Flagyl.   Assessment & Plan:   Principal Problem:   Transaminitis Active Problems:   Essential (primary) hypertension   Hypercholesteremia   Chronic diastolic CHF (congestive heart failure) (HCC)   Hypokalemia   Diarrhea   COPD (chronic obstructive pulmonary disease) (HCC)   Tobacco abuse   1 acute transaminitis -Questionable etiology.  Could likely be secondary to methotrexate versus dehydration as patient noted to have borderline blood pressure on admission and had multiple bouts of diarrhea.  Patient clinically dehydrated on examination. -Acute hepatitis panel pending. -CT abdomen and pelvis with cholelithiasis, no acute cholecystitis, cecal diverticulitis versus stump appendicitis. -Right upper quadrant abdominal ultrasound pending to rule out cholecystitis. -Patient without any abdominal pain on examination. -LFTs trending down. -Placed on IV fluids, supportive care. -Methotrexate on hold.  Statin on hold.  Follow.  2.  Diarrhea ?  Questionable etiology. -C. difficile PCR, GI pathogen panel pending. -Patient denies any watery stools today. -IV fluids,  supportive care.  3.  Hypokalemia -Likely secondary to GI losses. -Magnesium at 1.9 -Repleted, potassium at 3.8.  Follow.  4.  Cecal diverticulitis versus stump appendicitis -Patient with no significant abdominal pain. -GI pathogen panel, stool studies pending. -Afebrile. -WBC at 3.2. -Patient made n.p.o. on admission we will advance diet to a full liquid diet and if continued improvement trial of soft diet tomorrow. -Continue empiric IV ciprofloxacin and IV Flagyl.  Follow.  5.  COPD -Stable. -Continue home regimen Breo Ellipta, albuterol inhaler as needed.  6.  Tobacco abuse -Tobacco cessation.  7.  GERD -PPI on hold due to concerns for diarrhea. -Pepcid.  8.  Hypertension -Patient with soft blood pressure. -Hold Toprol-XL today.  9.  Hyperlipidemia -Continue to hold statin.  10.  Chronic diastolic heart failure -Patient more clinically dehydrated. -Gentle hydration. -Continue to hold Lasix.    DVT prophylaxis: SCDs Code Status: Full Family Communication: Updated patient.  No family at bedside. Disposition:   Status is: Inpatient  Remains inpatient appropriate because:IV treatments appropriate due to intensity of illness or inability to take PO  Dispo: The patient is from: Home              Anticipated d/c is to: Home              Patient currently is not medically stable to d/c.   Difficult to place patient No       Consultants:  None  Procedures:  CT abdomen pelvis 01/10/2021 Abdominal ultrasound pending 01/11/2021   Antimicrobials:  IV Flagyl 01/11/2021>>>> IV ciprofloxacin 01/11/2021>>>>>   Subjective: Laying in bed.  States she is hungry.  Denies any chest pain.  No shortness of breath.  No abdominal pain.  No further watery loose  stools.  Objective: Vitals:   01/11/21 0357 01/11/21 0500 01/11/21 0933 01/11/21 0940  BP: 120/73  109/64 106/63  Pulse: 92  76 76  Resp: 18  18   Temp: 98.1 F (36.7 C)  98.7 F (37.1 C)   TempSrc:    Oral   SpO2: 100%  97%   Weight:  60.6 kg    Height:        Intake/Output Summary (Last 24 hours) at 01/11/2021 1100 Last data filed at 01/11/2021 0404 Gross per 24 hour  Intake 939.31 ml  Output 100 ml  Net 839.31 ml   Filed Weights   01/10/21 1415 01/10/21 2300 01/11/21 0500  Weight: 59 kg 58.4 kg 60.6 kg    Examination:  General exam: Appears calm and comfortable.  Dry mucous membranes. Respiratory system: Clear to auscultation. Respiratory effort normal. Cardiovascular system: S1 & S2 heard, RRR. No JVD, murmurs, rubs, gallops or clicks. No pedal edema. Gastrointestinal system: Abdomen is nondistended, soft and nontender. No organomegaly or masses felt. Normal bowel sounds heard. Central nervous system: Alert and oriented. No focal neurological deficits. Extremities: Symmetric 5 x 5 power. Skin: No rashes, lesions or ulcers Psychiatry: Judgement and insight appear normal. Mood & affect appropriate.     Data Reviewed: I have personally reviewed following labs and imaging studies  CBC: Recent Labs  Lab 01/10/21 1653 01/11/21 0627  WBC 3.6* 3.2*  NEUTROABS 2.3 2.0  HGB 16.5* 14.3  HCT 48.5* 41.4  MCV 93.3 91.0  PLT 110* 89*    Basic Metabolic Panel: Recent Labs  Lab 01/10/21 1653 01/11/21 0627  NA 136 136  K 3.4* 3.8  CL 100 104  CO2 29 26  GLUCOSE 99 84  BUN 10 10  CREATININE 1.03* 1.02*  CALCIUM 8.4* 8.4*  MG 2.1 1.9  PHOS  --  3.1    GFR: Estimated Creatinine Clearance: 38.1 mL/min (A) (by C-G formula based on SCr of 1.02 mg/dL (H)).  Liver Function Tests: Recent Labs  Lab 01/10/21 1653 01/11/21 0627  AST 150* 117*  ALT 88* 74*  ALKPHOS 226* 204*  BILITOT 2.5* 2.2*  PROT 5.9* 5.3*  ALBUMIN 2.8* 2.6*    CBG: No results for input(s): GLUCAP in the last 168 hours.   Recent Results (from the past 240 hour(s))  Resp Panel by RT-PCR (Flu A&B, Covid) Nasopharyngeal Swab     Status: None   Collection Time: 01/10/21  8:00 PM   Specimen:  Nasopharyngeal Swab; Nasopharyngeal(NP) swabs in vial transport medium  Result Value Ref Range Status   SARS Coronavirus 2 by RT PCR NEGATIVE NEGATIVE Final    Comment: (NOTE) SARS-CoV-2 target nucleic acids are NOT DETECTED.  The SARS-CoV-2 RNA is generally detectable in upper respiratory specimens during the acute phase of infection. The lowest concentration of SARS-CoV-2 viral copies this assay can detect is 138 copies/mL. A negative result does not preclude SARS-Cov-2 infection and should not be used as the sole basis for treatment or other patient management decisions. A negative result may occur with  improper specimen collection/handling, submission of specimen other than nasopharyngeal swab, presence of viral mutation(s) within the areas targeted by this assay, and inadequate number of viral copies(<138 copies/mL). A negative result must be combined with clinical observations, patient history, and epidemiological information. The expected result is Negative.  Fact Sheet for Patients:  EntrepreneurPulse.com.au  Fact Sheet for Healthcare Providers:  IncredibleEmployment.be  This test is no t yet approved or cleared by the [place] FDA  and  has been authorized for detection and/or diagnosis of SARS-CoV-2 by FDA under an Emergency Use Authorization (EUA). This EUA will remain  in effect (meaning this test can be used) for the duration of the COVID-19 declaration under Section 564(b)(1) of the Act, 21 U.S.C.section 360bbb-3(b)(1), unless the authorization is terminated  or revoked sooner.       Influenza A by PCR NEGATIVE NEGATIVE Final   Influenza B by PCR NEGATIVE NEGATIVE Final    Comment: (NOTE) The Xpert Xpress SARS-CoV-2/FLU/RSV plus assay is intended as an aid in the diagnosis of influenza from Nasopharyngeal swab specimens and should not be used as a sole basis for treatment. Nasal washings and aspirates are unacceptable for  Xpert Xpress SARS-CoV-2/FLU/RSV testing.  Fact Sheet for Patients: EntrepreneurPulse.com.au  Fact Sheet for Healthcare Providers: IncredibleEmployment.be  This test is not yet approved or cleared by the [place] FDA and has been authorized for detection and/or diagnosis of SARS-CoV-2 by FDA under an Emergency Use Authorization (EUA). This EUA will remain in effect (meaning this test can be used) for the duration of the COVID-19 declaration under Section 564(b)(1) of the Act, 21 U.S.C. section 360bbb-3(b)(1), unless the authorization is terminated or revoked.  Performed at [facility], [address], [place], [place] [address]          Radiology Studies: CT ABDOMEN PELVIS W CONTRAST  Result Date: 01/10/2021 CLINICAL DATA:  Diarrhea.  Abdominal pain. EXAM: CT ABDOMEN AND PELVIS WITH CONTRAST TECHNIQUE: Multidetector CT imaging of the abdomen and pelvis was performed using the standard protocol following bolus administration of intravenous contrast. CONTRAST:  48mL OMNIPAQUE IOHEXOL 300 MG/ML  SOLN COMPARISON:  Most recent abdominopelvic CT 04/24/2020 FINDINGS: Lower chest: Unchanged 6 mm right lower lobe pulmonary nodule. Unchanged 4 mm right middle lobe pulmonary nodule. No acute airspace disease or pleural effusion. Hepatobiliary: Diffuse hepatic low density typical of steatosis. There is a stable 12 mm enhancing focus in the right lobe of the liver, series 2, image 25 consistent with flash filling hemangioma or AP shunt. No new hepatic lesion. Layering stones or sludge in the gallbladder. There is a Phrygian cap. Equivocal gallbladder wall thickening, series 2, image 31. No biliary dilatation. Pancreas: Mild fatty atrophy.  No ductal dilatation or inflammation. Spleen: Normal in size without focal abnormality. Splenule at the hilum. Adrenals/Urinary Tract: Minimal right adrenal thickening. No dominant adrenal mass. No hydronephrosis or  perinephric edema. Mild cortical scarring in the upper right kidney. No renal calculi or focal lesion. Urinary bladder is physiologically distended without wall thickening. Portions of the urinary bladder particularly in the right are obscured by streak artifact from right hip arthroplasty. Stomach/Bowel: The stomach is decompressed which limits detailed gastric assessment. There is no small bowel obstruction or inflammation. No obvious small bowel wall thickening. Patient has history of appendectomy. There is a blind-ending structure arising from the cecum coursing posteriorly, 2.3 cm in length, 9 mm transverse and fluid-filled, series 2, image 52. There is mild adjacent fat stranding. No enterolith. There is submucosal fatty infiltration involving the cecum and ascending colon. Formed stool in the transverse and descending colon. Small amount of liquid stool in the sigmoid colon with equivocal wall thickening, series 2, image 56. Vascular/Lymphatic: Dense aortic atherosclerosis. No aortic aneurysm. No acute aortic findings. Patent portal and splenic veins. No portal venous gas. No enlarged lymph nodes in the abdomen or pelvis, right pelvis assessment partially obscured by streak artifact. Reproductive: Possible atrophic uterus, pelvis partially obscured by streak  artifact. There is no left adnexal mass. Other: No free air or free fluid. Tiny fat containing umbilical hernia. 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 Right hip arthroplasty. Diminished sclerosis involving the right pubic body from prior exam. No acute osseous abnormalities are seen IMPRESSION: 1. Patient has listed history of appendectomy. There is a 2.3 cm tubular structure arising from the cecum measuring 9 mm in thickness at is fluid-filled with mild adjacent fat stranding. Differential considerations include cecal diverticulitis or stump appendicitis. 2. Small amount of liquid stool in the sigmoid colon with equivocal wall thickening,  can be seen with diarrheal illness. 3. Hepatic steatosis. Stable flash filling hemangioma or AP shunt in the right lobe of the liver. 4. Layering stones or sludge in the gallbladder with equivocal gallbladder wall thickening. If there is clinical concern for acute cholecystitis, recommend right upper quadrant ultrasound for further evaluation. 5. Stable pulmonary nodules at the right lung base. Aortic Atherosclerosis (ICD10-I70.0). Electronically Signed   By: Keith Rake M.D.   On: 01/10/2021 20:09        Scheduled Meds:  cetirizine  5 mg Oral QPM   donepezil  10 mg Oral Daily   famotidine  40 mg Oral Daily   ferrous sulfate  325 mg Oral BID   fluticasone furoate-vilanterol  1 puff Inhalation Daily   folic acid  1 mg Oral Daily   gabapentin  300 mg Oral BID   metoprolol succinate  50 mg Oral Daily   rOPINIRole  1 mg Oral QHS   Continuous Infusions:  sodium chloride     ciprofloxacin 200 mL/hr at 01/11/21 0404   metronidazole 500 mg (01/11/21 0937)     LOS: 1 day    Time spent: 40 minutes    Irine Seal, MD [facility]   To contact the attending provider between 7A-7P or the covering provider during after hours 7P-7A, please log into the web site [email or web address] and access using universal [place] password for that web site. If you do not have the password, please call the hospital operator.  01/11/2021, 11:00 AM

## 2021-01-11 NOTE — Evaluation (Signed)
Occupational Therapy Evaluation Patient Details Name: Mariah [name] MRN: [id number] DOB: [birth year] Today's Date: 01/11/2021    History of Present Illness 75 year old female history of COPD, GERD, hyperlipidemia, psoriasis on Cosentyx and methotrexate, chronic diastolic heart failure presented to ED for evaluation for diarrhea and noted on presentation to have a acute transaminitis.  CT abdomen and pelvis done concerning for a cecal diverticulitis versus stump appendicitis.  Also concerning was cholelithiasis.  Abdominal ultrasound done to rule out acute cholecystitis.  Acute hepatitis panel pending   Clinical Impression   Upon entering the room, pt supine in bed and agreeable to OT intervention. Pt with no c/o pain this session. Pt reports living with niece and other family PTA. She ambulates in home by furniture cruising and uses cane in community. She does not drive and family assists her getting to appointment as well as with other IADL tasks. Pt reports taking sink bath but having a friend assist with getting in the shower 1x/wk for safety. Pt demonstrates mobility and self care tasks in standing and sitting EOB without physical assistance. She does furniture cruise within hospital room, similar to home situation, without assistance and no LOB. Pt reports she is close to baseline and at this time has no skilled acute OT needs. OT does recommend 3 in 1 commode chair for shower transfer and to decrease fall risk with frequent urination at night. All needs within reach. OT to SIGN OFF.    Follow Up Recommendations  No OT follow up;Supervision - Intermittent    Equipment Recommendations  3 in 1 bedside commode       Precautions / Restrictions Precautions Precautions: Fall      Mobility Bed Mobility Overal bed mobility: Modified Independent             General bed mobility comments: HOB elevated    Transfers Overall transfer level: Modified independent Equipment used: None              General transfer comment: furniture cruising in room without assistance or LOB noted. This is how pt moves at home and uses cane in community    Balance Overall balance assessment: Needs assistance Sitting-balance support: Feet supported Sitting balance-Leahy Scale: Normal     Standing balance support: During functional activity Standing balance-Leahy Scale: Fair                             ADL either performed or assessed with clinical judgement   ADL Overall ADL's : At baseline                                       General ADL Comments: supervision -  mod I and pt reports feeling close to baseline.     Vision Patient Visual Report: No change from baseline              Pertinent Vitals/Pain Pain Assessment: No/denies pain     Hand Dominance Right   Extremity/Trunk Assessment Upper Extremity Assessment Upper Extremity Assessment: Overall WFL for tasks assessed   Lower Extremity Assessment Lower Extremity Assessment: Overall WFL for tasks assessed       Communication Communication Communication: HOH   Cognition Arousal/Alertness: Awake/alert Behavior During Therapy: WFL for tasks assessed/performed Overall Cognitive Status: Within Functional Limits for tasks assessed  Home Living Family/patient expects to be discharged to:: Private residence Living Arrangements: Other relatives (lives with niece who can assist her as needed) Available Help at Discharge: Family;Friend(s);Available 24 hours/day Type of Home: House Home Access: Stairs to enter [employer] of Steps: 4-5 Entrance Stairs-Rails: Right;Left Home Layout: One level     Bathroom Shower/Tub: Tub/shower unit;Curtain         Home Equipment: Environmental consultant - 2 wheels;Cane - quad;Cane - single point   Additional Comments: Pt has been living with niece. Furniture walks in the home, uses hurry  cane in community. 2 falls in past 6 months.      Prior Functioning/Environment Level of Independence: Independent with assistive device(s)        Comments: Furniture walks in the home, uses hurry cane in community.                 OT Goals(Current goals can be found in the care plan section) Acute Rehab OT Goals Patient Stated Goal: to go home OT Goal Formulation: With patient Time For Goal Achievement: 01/11/21 Potential to Achieve Goals: Fair   AM-PAC OT "6 Clicks" Daily Activity     Outcome Measure Help from another person eating meals?: None Help from another person taking care of personal grooming?: None Help from another person toileting, which includes using toliet, bedpan, or urinal?: None Help from another person bathing (including washing, rinsing, drying)?: None Help from another person to put on and taking off regular upper body clothing?: None Help from another person to put on and taking off regular lower body clothing?: None 6 Click Score: 24   End of Session Nurse Communication: Mobility status  Activity Tolerance: Patient tolerated treatment well Patient left: in bed;with call bell/phone within reach;with bed alarm set                   Time: 1500-1531 OT Time Calculation (min): 31 min Charges:  OT General Charges $OT Visit: 1 Visit OT Evaluation $OT Eval Low Complexity: 1 Low OT Treatments $Self Care/Home Management : 23-37 mins Darleen Crocker, MS, OTR/L , CBIS ascom [phone]  01/11/21, 3:42 PM

## 2021-01-12 LAB — CBC WITH DIFFERENTIAL/PLATELET
Abs Immature Granulocytes: 0.01 10*3/uL (ref 0.00–0.07)
Basophils Absolute: 0 10*3/uL (ref 0.0–0.1)
Basophils Relative: 0 %
Eosinophils Absolute: 0.2 10*3/uL (ref 0.0–0.5)
Eosinophils Relative: 7 %
HCT: 41.1 % (ref 36.0–46.0)
Hemoglobin: 14.2 g/dL (ref 12.0–15.0)
Immature Granulocytes: 0 %
Lymphocytes Relative: 23 %
Lymphs Abs: 0.6 10*3/uL — ABNORMAL LOW (ref 0.7–4.0)
MCH: 31.2 pg (ref 26.0–34.0)
MCHC: 34.5 g/dL (ref 30.0–36.0)
MCV: 90.3 fL (ref 80.0–100.0)
Monocytes Absolute: 0.2 10*3/uL (ref 0.1–1.0)
Monocytes Relative: 8 %
Neutro Abs: 1.7 10*3/uL (ref 1.7–7.7)
Neutrophils Relative %: 62 %
Platelets: 77 10*3/uL — ABNORMAL LOW (ref 150–400)
RBC: 4.55 MIL/uL (ref 3.87–5.11)
RDW: 13.8 % (ref 11.5–15.5)
WBC: 2.7 10*3/uL — ABNORMAL LOW (ref 4.0–10.5)
nRBC: 0 % (ref 0.0–0.2)

## 2021-01-12 LAB — COMPREHENSIVE METABOLIC PANEL
ALT: 73 U/L — ABNORMAL HIGH (ref 0–44)
AST: 132 U/L — ABNORMAL HIGH (ref 15–41)
Albumin: 2.4 g/dL — ABNORMAL LOW (ref 3.5–5.0)
Alkaline Phosphatase: 197 U/L — ABNORMAL HIGH (ref 38–126)
Anion gap: 6 (ref 5–15)
BUN: 6 mg/dL — ABNORMAL LOW (ref 8–23)
CO2: 24 mmol/L (ref 22–32)
Calcium: 8.2 mg/dL — ABNORMAL LOW (ref 8.9–10.3)
Chloride: 107 mmol/L (ref 98–111)
Creatinine, Ser: 0.89 mg/dL (ref 0.44–1.00)
GFR, Estimated: >60 mL/min (ref >60)
Glucose, Bld: 104 mg/dL — ABNORMAL HIGH (ref 70–99)
Potassium: 3.5 mmol/L (ref 3.5–5.1)
Sodium: 137 mmol/L (ref 135–145)
Total Bilirubin: 1.9 mg/dL — ABNORMAL HIGH (ref 0.3–1.2)
Total Protein: 4.9 g/dL — ABNORMAL LOW (ref 6.5–8.1)

## 2021-01-12 LAB — MAGNESIUM: Magnesium: 1.9 mg/dL (ref 1.7–2.4)

## 2021-01-12 MED ORDER — POTASSIUM CHLORIDE CRYS ER 20 MEQ PO TBCR
40.0000 meq | EXTENDED_RELEASE_TABLET | Freq: Once | ORAL | Status: AC
  Administered 2021-01-12: 40 meq via ORAL
  Filled 2021-01-12: qty 2

## 2021-01-12 MED ORDER — LOPERAMIDE HCL 2 MG PO CAPS: 2.0000 mg | ORAL_CAPSULE | ORAL | Status: DC | PRN

## 2021-01-12 MED ORDER — LOPERAMIDE HCL 2 MG PO CAPS
4.0000 mg | ORAL_CAPSULE | Freq: Once | ORAL | Status: AC
  Administered 2021-01-12: 4 mg via ORAL
  Filled 2021-01-12: qty 2

## 2021-01-12 MED ORDER — SODIUM CHLORIDE 0.9 % IV SOLN
INTRAVENOUS | Status: DC | PRN
  Administered 2021-01-12 – 2021-01-13 (×2): 250 mL via INTRAVENOUS

## 2021-01-12 NOTE — Evaluation (Signed)
Physical Therapy Evaluation Patient Details Name: Mariah [name] MRN: [id number] DOB: 05-26-1946 Today's Date: 01/12/2021   History of Present Illness  75 year old female history of COPD, GERD, hyperlipidemia, psoriasis on Cosentyx and methotrexate, chronic diastolic heart failure presented to ED for evaluation for diarrhea and noted on presentation to have a acute transaminitis.  CT abdomen and pelvis done concerning for a cecal diverticulitis versus stump appendicitis.  Also concerning was cholelithiasis.  Abdominal ultrasound done to rule out acute cholecystitis.  Acute hepatitis panel pending  Clinical Impression  The pt presents this session is good spirits. She is able to ambulate safely with the use of her personal SPC. The pt completes components of the DGI during gait without any significant balance concerns. The pt is safe to d/c home with family care once medically ready. PT will delist at this time.     Follow Up Recommendations No PT follow up    Equipment Recommendations       Recommendations for Other Services       Precautions / Restrictions Precautions Precautions: Fall Restrictions Weight Bearing Restrictions: No      Mobility  Bed Mobility Overal bed mobility: Modified Independent             General bed mobility comments: HOB elevated    Transfers Overall transfer level: Modified independent Equipment used: Straight cane             General transfer comment: Pt utilizing personal cane  Ambulation/Gait Ambulation/Gait assistance: Supervision Gait Distance (Feet): 200 Feet Assistive device: Straight cane Gait Pattern/deviations: WFL(Within Functional Limits)        Stairs            Wheelchair Mobility    Modified Rankin (Stroke Patients Only)       Balance Overall balance assessment: Mild deficits observed, not formally tested         Standing balance support: During functional activity Standing balance-Leahy Scale:  Good                               Pertinent Vitals/Pain Pain Assessment: No/denies pain    Home Living Family/patient expects to be discharged to:: Private residence Living Arrangements: Other relatives Available Help at Discharge: Family;Friend(s);Available 24 hours/day Type of Home: House Home Access: Stairs to enter Entrance Stairs-Rails: Right Entrance Stairs-Number of Steps: 5 Home Layout: One level Home Equipment: Walker - 2 wheels;Cane - quad;Cane - single point Additional Comments: Pt has been living with niece. Furniture walks in the home, uses hurry cane in community. 2 falls in past 6 months.    Prior Function Level of Independence: Independent with assistive device(s)         Comments: Furniture walks in the home, uses hurry cane in community.     Hand Dominance   Dominant Hand: Right    Extremity/Trunk Assessment   Upper Extremity Assessment Upper Extremity Assessment: Overall WFL for tasks assessed    Lower Extremity Assessment Lower Extremity Assessment: Overall WFL for tasks assessed       Communication   Communication: No difficulties  Cognition Arousal/Alertness: Awake/alert Behavior During Therapy: WFL for tasks assessed/performed Overall Cognitive Status: Within Functional Limits for tasks assessed                                        General Comments  Exercises Other Exercises Other Exercises: EDU: pt educated to ambulated with staff 2x daily.   Assessment/Plan    PT Assessment Patent does not need any further PT services  PT Problem List         PT Treatment Interventions      PT Goals (Current goals can be found in the Care Plan section)  Acute Rehab PT Goals Patient Stated Goal: to go home PT Goal Formulation: With patient Time For Goal Achievement: 01/26/21 Potential to Achieve Goals: Good    Frequency     Barriers to discharge        Co-evaluation               AM-PAC  PT "6 Clicks" Mobility  Outcome Measure Help needed turning from your back to your side while in a flat bed without using bedrails?: None Help needed moving from lying on your back to sitting on the side of a flat bed without using bedrails?: None Help needed moving to and from a bed to a chair (including a wheelchair)?: None Help needed standing up from a chair using your arms (e.g., wheelchair or bedside chair)?: None Help needed to walk in hospital room?: None Help needed climbing 3-5 steps with a railing? : A Little 6 Click Score: 23    End of Session Equipment Utilized During Treatment: Gait belt Activity Tolerance: Patient tolerated treatment well Patient left: in bed;with bed alarm set Nurse Communication: Mobility status      Time: 4888-9169 PT Time Calculation (min) (ACUTE ONLY): 48 min   Charges:   PT Evaluation $PT Eval Moderate Complexity: 1 Mod PT Treatments $Gait Training: 23-37 mins $Therapeutic Activity: 8-22 mins        12:18 PM, 01/12/21 Maile Linford A. Saverio Danker PT, DPT Physical Therapist - [facility] [place] 01/12/2021, 12:17 PM

## 2021-01-12 NOTE — Progress Notes (Signed)
PROGRESS NOTE    Mariah [name]  UEA:[id number] DOB: June 07, 1946 DOA: 01/10/2021 PCP: [name], MD    Chief Complaint  Patient presents with   Diarrhea    Brief Narrative:  Patient 75 year old female history of COPD, GERD, hyperlipidemia, psoriasis on Cosentyx and methotrexate, chronic diastolic heart failure presented to ED for evaluation for diarrhea and noted on presentation to have a acute transaminitis.  CT abdomen and pelvis done concerning for a cecal diverticulitis versus stump appendicitis.  Also concerning was cholelithiasis.  Abdominal ultrasound done to rule out acute cholecystitis.  Acute hepatitis panel pending.  Patient placed empirically on IV ciprofloxacin and IV Flagyl.   Assessment & Plan:   Principal Problem:   Transaminitis Active Problems:   Essential (primary) hypertension   Hypercholesteremia   Chronic diastolic CHF (congestive heart failure) (HCC)   Hypokalemia   Diarrhea   COPD (chronic obstructive pulmonary disease) (HCC)   Tobacco abuse   1 acute transaminitis -Questionable etiology.  Could likely be secondary to methotrexate versus dehydration as patient noted to have borderline blood pressure on admission and had multiple bouts of diarrhea.  -Patient states not sure when the last time she took methotrexate was but thinks she has not taken it in a while.  -Transaminitis fluctuating.  -Acute hepatitis panel is negative.   -CT abdomen and pelvis with cholelithiasis, no acute cholecystitis, cecal diverticulitis versus stump appendicitis. -Right upper quadrant abdominal ultrasound with mobile stones and sludge in the gallbladder with mild wall thickening with no Murphy sign no pericholecystic fluid.  Increased echogenicity in the liver nonspecific but could be due to hepatic steatosis. -On gentle hydration.   -Methotrexate on hold however patient states not sure when she last had methotrexate and think she has been off of it for a while.   -Continue to hold statin.   -Consult with GI for further evaluation and management.  2.  Diarrhea ?  Questionable etiology. -C. difficile PCR negative.  GI pathogen panel negative. -Clinical improvement. -Supportive care.  3.  Hypokalemia -Likely secondary to GI losses. -Magnesium at 1.9 -Potassium at 3.5. -K-Dur 40 mEq p.o. x1.  4.  Cecal diverticulitis versus stump appendicitis -Patient with no abdominal pain.   -GI pathogen panel negative.   -C. difficile PCR negative.   -Patient afebrile.   -White count at 2.7.   -Tolerated full liquid diet yesterday and soft diet this morning.   -Continue empiric IV ciprofloxacin and IV Flagyl and transition to oral antibiotics tomorrow if continued improvement.   -Follow.   5.  COPD -Stable. -Continue home regimen Breo Ellipta, albuterol inhaler as needed.  6.  Tobacco abuse -Tobacco cessation.  7.  GERD -PPI on hold due to concerns for diarrhea. -Continue Pepcid.  8.  Hypertension -Patient with soft blood pressure. -Blood pressure improved with hydration.   -Resume home regimen Toprol-XL.   9.  Hyperlipidemia -Statin on hold due to transaminitis.   10.  Chronic diastolic heart failure -Patient more clinically dehydrated. -Continue to hold diuretics.   -Continue gentle hydration and DC IV fluids this afternoon.     DVT prophylaxis: SCDs Code Status: Full Family Communication: Updated patient and family/friends at bedside..   Disposition:   Status is: Inpatient  Remains inpatient appropriate because:IV treatments appropriate due to intensity of illness or inability to take PO  Dispo: The patient is from: Home              Anticipated d/c is to: Home  Patient currently is not medically stable to d/c.   Difficult to place patient No       Consultants:  None  Procedures:  CT abdomen pelvis 01/10/2021 Abdominal ultrasound 01/11/2021   Antimicrobials:  IV Flagyl 01/11/2021>>>> IV ciprofloxacin  01/11/2021>>>>>   Subjective: Patient laying in bed.  Denies any chest pain.  No shortness of breath.  No abdominal pain.  Tolerated soft diet this morning.  Family at bedside.  Diarrhea improved.  Patient states not sure when the last time she took methotrexate was.  Objective: Vitals:   01/11/21 1711 01/11/21 2019 01/12/21 0459 01/12/21 0800  BP: 122/72 127/79 122/66 (!) 148/90  Pulse: 62 71 86 72  Resp: 17 18 20 18   Temp: 97.6 F (36.4 C) 98.2 F (36.8 C) 97.8 F (36.6 C) 98.7 F (37.1 C)  TempSrc: Oral Oral    SpO2: 99% 100% 100% 100%  Weight:   54.2 kg   Height:        Intake/Output Summary (Last 24 hours) at 01/12/2021 1116 Last data filed at 01/12/2021 1034 Gross per 24 hour  Intake 3214.25 ml  Output 500 ml  Net 2714.25 ml    Filed Weights   01/10/21 2300 01/11/21 0500 01/12/21 0459  Weight: 58.4 kg 60.6 kg 54.2 kg    Examination:  General exam: : NAD Respiratory system: CTA B.  No wheezes, no rhonchi.  Speaking in full sentences.  Normal respiratory effort. Cardiovascular system: Regular rate and rhythm no murmurs rubs or gallops.  No JVD.  No lower extremity edema.  Gastrointestinal system: Abdomen soft, nontender, nondistended, positive bowel sounds.  No rebound.  No guarding. Central nervous system: Alert and oriented. No focal neurological deficits. Extremities: Symmetric 5 x 5 power. Skin: No rashes, lesions or ulcers Psychiatry: Judgement and insight appear normal. Mood & affect appropriate.   Data Reviewed: I have personally reviewed following labs and imaging studies  CBC: Recent Labs  Lab 01/10/21 1653 01/11/21 0627 01/12/21 0605  WBC 3.6* 3.2* 2.7*  NEUTROABS 2.3 2.0 1.7  HGB 16.5* 14.3 14.2  HCT 48.5* 41.4 41.1  MCV 93.3 91.0 90.3  PLT 110* 89* 77*     Basic Metabolic Panel: Recent Labs  Lab 01/10/21 1653 01/11/21 0627 01/12/21 0605  NA 136 136 137  K 3.4* 3.8 3.5  CL 100 104 107  CO2 29 26 24   GLUCOSE 99 84 104*  BUN 10 10  6*  CREATININE 1.03* 1.02* 0.89  CALCIUM 8.4* 8.4* 8.2*  MG 2.1 1.9 1.9  PHOS  --  3.1  --      GFR: Estimated Creatinine Clearance: 38.6 mL/min (by C-G formula based on SCr of 0.89 mg/dL).  Liver Function Tests: Recent Labs  Lab 01/10/21 1653 01/11/21 0627 01/12/21 0605  AST 150* 117* 132*  ALT 88* 74* 73*  ALKPHOS 226* 204* 197*  BILITOT 2.5* 2.2* 1.9*  PROT 5.9* 5.3* 4.9*  ALBUMIN 2.8* 2.6* 2.4*     CBG: No results for input(s): GLUCAP in the last 168 hours.   Recent Results (from the past 240 hour(s))  Resp Panel by RT-PCR (Flu A&B, Covid) Nasopharyngeal Swab     Status: None   Collection Time: 01/10/21  8:00 PM   Specimen: Nasopharyngeal Swab; Nasopharyngeal(NP) swabs in vial transport medium  Result Value Ref Range Status   SARS Coronavirus 2 by RT PCR NEGATIVE NEGATIVE Final    Comment: (NOTE) SARS-CoV-2 target nucleic acids are NOT DETECTED.  The SARS-CoV-2 RNA is generally detectable  in upper respiratory specimens during the acute phase of infection. The lowest concentration of SARS-CoV-2 viral copies this assay can detect is 138 copies/mL. A negative result does not preclude SARS-Cov-2 infection and should not be used as the sole basis for treatment or other patient management decisions. A negative result may occur with  improper specimen collection/handling, submission of specimen other than nasopharyngeal swab, presence of viral mutation(s) within the areas targeted by this assay, and inadequate number of viral copies(<138 copies/mL). A negative result must be combined with clinical observations, patient history, and epidemiological information. The expected result is Negative.  Fact Sheet for Patients:  EntrepreneurPulse.com.au  Fact Sheet for Healthcare Providers:  IncredibleEmployment.be  This test is no t yet approved or cleared by the [place] FDA and  has been authorized for detection and/or diagnosis  of SARS-CoV-2 by FDA under an Emergency Use Authorization (EUA). This EUA will remain  in effect (meaning this test can be used) for the duration of the COVID-19 declaration under Section 564(b)(1) of the Act, 21 U.S.C.section 360bbb-3(b)(1), unless the authorization is terminated  or revoked sooner.       Influenza A by PCR NEGATIVE NEGATIVE Final   Influenza B by PCR NEGATIVE NEGATIVE Final    Comment: (NOTE) The Xpert Xpress SARS-CoV-2/FLU/RSV plus assay is intended as an aid in the diagnosis of influenza from Nasopharyngeal swab specimens and should not be used as a sole basis for treatment. Nasal washings and aspirates are unacceptable for Xpert Xpress SARS-CoV-2/FLU/RSV testing.  Fact Sheet for Patients: EntrepreneurPulse.com.au  Fact Sheet for Healthcare Providers: IncredibleEmployment.be  This test is not yet approved or cleared by the [place] FDA and has been authorized for detection and/or diagnosis of SARS-CoV-2 by FDA under an Emergency Use Authorization (EUA). This EUA will remain in effect (meaning this test can be used) for the duration of the COVID-19 declaration under Section 564(b)(1) of the Act, 21 U.S.C. section 360bbb-3(b)(1), unless the authorization is terminated or revoked.  Performed at [facility], [address] [place], [place] [address]   Gastrointestinal Panel by PCR , Stool     Status: None   Collection Time: 01/11/21  1:58 PM   Specimen: Stool  Result Value Ref Range Status   Campylobacter species NOT DETECTED NOT DETECTED Final   Plesimonas shigelloides NOT DETECTED NOT DETECTED Final   Salmonella species NOT DETECTED NOT DETECTED Final   Yersinia enterocolitica NOT DETECTED NOT DETECTED Final   Vibrio species NOT DETECTED NOT DETECTED Final   Vibrio cholerae NOT DETECTED NOT DETECTED Final   Enteroaggregative E coli (EAEC) NOT DETECTED NOT DETECTED Final   Enteropathogenic E coli  (EPEC) NOT DETECTED NOT DETECTED Final   Enterotoxigenic E coli (ETEC) NOT DETECTED NOT DETECTED Final   Shiga like toxin producing E coli (STEC) NOT DETECTED NOT DETECTED Final   Shigella/Enteroinvasive E coli (EIEC) NOT DETECTED NOT DETECTED Final   Cryptosporidium NOT DETECTED NOT DETECTED Final   Cyclospora cayetanensis NOT DETECTED NOT DETECTED Final   Entamoeba histolytica NOT DETECTED NOT DETECTED Final   Giardia lamblia NOT DETECTED NOT DETECTED Final   Adenovirus F40/41 NOT DETECTED NOT DETECTED Final   Astrovirus NOT DETECTED NOT DETECTED Final   Norovirus GI/GII NOT DETECTED NOT DETECTED Final   Rotavirus A NOT DETECTED NOT DETECTED Final   Sapovirus (I, II, IV, and V) NOT DETECTED NOT DETECTED Final    Comment: Performed at [facility], [address] [place], [place] [address]  C Difficile  Quick Screen w PCR reflex     Status: None   Collection Time: 01/11/21  1:58 PM   Specimen: STOOL  Result Value Ref Range Status   C Diff antigen NEGATIVE NEGATIVE Final   C Diff toxin NEGATIVE NEGATIVE Final   C Diff interpretation No C. difficile detected.  Final    Comment: Performed at [facility], [address], [place], [place] [address]          Radiology Studies: CT ABDOMEN PELVIS W CONTRAST  Result Date: 01/10/2021 CLINICAL DATA:  Diarrhea.  Abdominal pain. EXAM: CT ABDOMEN AND PELVIS WITH CONTRAST TECHNIQUE: Multidetector CT imaging of the abdomen and pelvis was performed using the standard protocol following bolus administration of intravenous contrast. CONTRAST:  16mL OMNIPAQUE IOHEXOL 300 MG/ML  SOLN COMPARISON:  Most recent abdominopelvic CT 04/24/2020 FINDINGS: Lower chest: Unchanged 6 mm right lower lobe pulmonary nodule. Unchanged 4 mm right middle lobe pulmonary nodule. No acute airspace disease or pleural effusion. Hepatobiliary: Diffuse hepatic low density typical of steatosis. There is a stable 12 mm enhancing focus in the right lobe of the  liver, series 2, image 25 consistent with flash filling hemangioma or AP shunt. No new hepatic lesion. Layering stones or sludge in the gallbladder. There is a Phrygian cap. Equivocal gallbladder wall thickening, series 2, image 31. No biliary dilatation. Pancreas: Mild fatty atrophy.  No ductal dilatation or inflammation. Spleen: Normal in size without focal abnormality. Splenule at the hilum. Adrenals/Urinary Tract: Minimal right adrenal thickening. No dominant adrenal mass. No hydronephrosis or perinephric edema. Mild cortical scarring in the upper right kidney. No renal calculi or focal lesion. Urinary bladder is physiologically distended without wall thickening. Portions of the urinary bladder particularly in the right are obscured by streak artifact from right hip arthroplasty. Stomach/Bowel: The stomach is decompressed which limits detailed gastric assessment. There is no small bowel obstruction or inflammation. No obvious small bowel wall thickening. Patient has history of appendectomy. There is a blind-ending structure arising from the cecum coursing posteriorly, 2.3 cm in length, 9 mm transverse and fluid-filled, series 2, image 52. There is mild adjacent fat stranding. No enterolith. There is submucosal fatty infiltration involving the cecum and ascending colon. Formed stool in the transverse and descending colon. Small amount of liquid stool in the sigmoid colon with equivocal wall thickening, series 2, image 56. Vascular/Lymphatic: Dense aortic atherosclerosis. No aortic aneurysm. No acute aortic findings. Patent portal and splenic veins. No portal venous gas. No enlarged lymph nodes in the abdomen or pelvis, right pelvis assessment partially obscured by streak artifact. Reproductive: Possible atrophic uterus, pelvis partially obscured by streak artifact. There is no left adnexal mass. Other: No free air or free fluid. Tiny fat containing umbilical hernia. 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 Right hip arthroplasty. Diminished sclerosis involving the right pubic body from prior exam. No acute osseous abnormalities are seen IMPRESSION: 1. Patient has listed history of appendectomy. There is a 2.3 cm tubular structure arising from the cecum measuring 9 mm in thickness at is fluid-filled with mild adjacent fat stranding. Differential considerations include cecal diverticulitis or stump appendicitis. 2. Small amount of liquid stool in the sigmoid colon with equivocal wall thickening, can be seen with diarrheal illness. 3. Hepatic steatosis. Stable flash filling hemangioma or AP shunt in the right lobe of the liver. 4. Layering stones or sludge in the gallbladder with equivocal gallbladder wall thickening. If there is clinical concern for acute cholecystitis, recommend right upper quadrant ultrasound for  further evaluation. 5. Stable pulmonary nodules at the right lung base. Aortic Atherosclerosis (ICD10-I70.0). Electronically Signed   By: Keith Rake M.D.   On: 01/10/2021 20:09   US ABDOMEN LIMITED RUQ (LIVER/GB)  Result Date: 01/11/2021 CLINICAL DATA:  Acute transaminitis EXAM: ULTRASOUND ABDOMEN LIMITED RIGHT UPPER QUADRANT COMPARISON:  None. FINDINGS: Gallbladder: Mobile stones and sludge in the gallbladder. The gallbladder wall measures 4 mm. No Murphy's sign or pericholecystic fluid. Common bile duct: Diameter: 3 mm Liver: Increased echogenicity with no focal mass. Portal vein is patent on color Doppler imaging with normal direction of blood flow towards the liver. Other: None. IMPRESSION: 1. Mobile stones and sludge in the gallbladder with mild wall thickening but no Murphy's sign or pericholecystic fluid. If the clinical picture remains ambiguous, a HIDA scan could further evaluate. 2. Increased echogenicity in the liver is nonspecific but often due to hepatic steatosis. Electronically Signed   By: Dorise Bullion III M.D   On: 01/11/2021 10:58        Scheduled Meds:  cetirizine   5 mg Oral QPM   donepezil  10 mg Oral Daily   famotidine  40 mg Oral Daily   ferrous sulfate  325 mg Oral BID   fluticasone furoate-vilanterol  1 puff Inhalation Daily   folic acid  1 mg Oral Daily   gabapentin  300 mg Oral BID   metoprolol succinate  50 mg Oral Daily   rOPINIRole  1 mg Oral QHS   Continuous Infusions:  sodium chloride 100 mL/hr at 01/12/21 0341   sodium chloride Stopped (01/12/21 0230)   ciprofloxacin Stopped (01/12/21 0207)   metronidazole 500 mg (01/12/21 0843)     LOS: 2 days    Time spent: 35 minutes    Irine Seal, MD [facility]   To contact the attending provider between 7A-7P or the covering provider during after hours 7P-7A, please log into the web site [email or web address] and access using universal [place] password for that web site. If you do not have the password, please call the hospital operator.  01/12/2021, 11:16 AM

## 2021-01-12 NOTE — Consult Note (Signed)
Consultation  Referring Provider:  Dr. Grandville Silos Admit date: 6/24 Consult date: 6/26         Reason for Consultation:    Abnormal LFT's          HPI:   Mariah [name] is a 75 y.o. lady with history of psoriatic arthritis on cosentyx and supposedly methotrexate although she says she has not taken this since April who presented with diarrhea that has since resolved but has had elevation in her liver enzymes. An acute hepatitis panel is negative. Imaging suggests hepatic steatosis and no evidence of biliary obstruction. Patient is currently asymptomatic. Patient has a history of long term methotrexate use although not using recently (no note of stopping made by her Rheumatologist). She does have thrombocytopenia and low albumin.  Past Medical History:  Diagnosis Date   Abdominal aortic aneurysm (AAA) ([place])    Arthritis    CAP (community acquired pneumonia) 06/03/2020   Chronic diastolic heart failure (HCC)    COPD (chronic obstructive pulmonary disease) (HCC)    GERD (gastroesophageal reflux disease)    Headache    sinus   History of adenomatous polyp of colon    Hyperlipidemia    Hypertension    Legionella infection ([place]) 07/17/2020   Psoriasis    Right lumbar radiculitis    Vitamin D deficiency     Past Surgical History:  Procedure Laterality Date   APPENDECTOMY  2000   BREAST BIOPSY Right 09/2016   radial scar;COMPLEX SCLEROSING LESION   BREAST LUMPECTOMY Right 11/24/2016   COMPLEX SCLEROSING LESION   BREAST LUMPECTOMY WITH NEEDLE LOCALIZATION Right 11/24/2016   Procedure: BREAST LUMPECTOMY WITH NEEDLE LOCALIZATION;  Surgeon: Leonie Green, MD;  Location: [facility];  Service: General;  Laterality: Right;   COLONOSCOPY WITH PROPOFOL N/A 08/10/2016   Procedure: COLONOSCOPY WITH PROPOFOL;  Surgeon: Manya Silvas, MD;  Location: [place];  Service: Endoscopy;  Laterality: N/A;   ESOPHAGOGASTRODUODENOSCOPY (EGD) WITH PROPOFOL N/A 08/10/2016   Procedure:  ESOPHAGOGASTRODUODENOSCOPY (EGD) WITH PROPOFOL;  Surgeon: Manya Silvas, MD;  Location: [facility];  Service: Endoscopy;  Laterality: N/A;   FOOT SURGERY Bilateral    JOINT REPLACEMENT Right 01/27/2016   hip   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   TOTAL HIP ARTHROPLASTY Right 01/27/2016   Procedure: TOTAL HIP ARTHROPLASTY;  Surgeon: Dereck Leep, MD;  Location: [facility];  Service: Orthopedics;  Laterality: Right;    Family History  Problem Relation Age of Onset   Cancer Mother        kidney   Arthritis Father    Heart disease Father    COPD Sister    Fibromyalgia Sister    Osteoarthritis Sister    Osteoarthritis Maternal Aunt    Colon cancer Paternal Aunt    Colon cancer Paternal Uncle    Colon cancer Paternal Grandmother    Breast cancer Neg Hx     Social History   Tobacco Use   Smoking status: Every Day    Packs/day: 1.50    Years: 40.00    Pack years: 60.00    Types: Cigarettes   Smokeless tobacco: Never   Tobacco comments:    0.5 PPD 06/19/2020  Vaping Use   Vaping Use: Some days  Substance Use Topics   Alcohol use: Yes    Comment: occasionally glass of wine   Drug use: No    Prior to Admission medications   Medication Sig Start Date End Date Taking? Authorizing Provider  ciprofloxacin (CIPRO)  500 MG tablet Take 1 tablet (500 mg total) by mouth 2 (two) times daily for 7 days. 01/10/21 01/17/21 Yes Triplett, Cari B, FNP  metroNIDAZOLE (FLAGYL) 500 MG tablet Take 1 tablet (500 mg total) by mouth 3 (three) times daily for 7 days. 01/10/21 01/17/21 Yes Triplett, Cari B, FNP  albuterol (VENTOLIN HFA) 108 (90 Base) MCG/ACT inhaler INHALE 1-2 PUFFS INTO THE LUNGS EVERY 6 HOURS AS NEEDED FOR WHEEZING OR SHORTNESS OF BREATH. 05/22/20   Carles Collet M, PA-C  apixaban (ELIQUIS) 5 MG TABS tablet Take 1 tablet (5 mg total) by mouth 2 (two) times daily. Patient not taking: Reported on 11/18/2020 06/09/20   Nita Sells, MD  betamethasone dipropionate (DIPROLENE) 0.05 %  ointment Apply topically daily. 01/01/20   Ralene Bathe, MD  BREO ELLIPTA 100-25 MCG/INH AEPB TAKE 1 PUFF BY MOUTH EVERY DAY 09/01/20   [name], MD  calcipotriene (DOVONOX) 0.005 % cream APPLY TO SKIN ONCE A DAY APPLY DAILY ON HANDS, FEET AND WRIST X 2 WEEKS, THEN USE 5 DAYS A WEEK 01/01/20   Ralene Bathe, MD  Calcipotriene 0.005 % solution APPLY 1 A SMALL AMOUNT TO SCALP ONCE A DAY APPLY TO SCALP DAILY FOR 2 WEEKS, THEN USE ON SCALP 5 DAYS A WEEK 09/02/20   Ralene Bathe, MD  Cholecalciferol (VITAMIN D3) 2000 units TABS Take 2,000 Units by mouth daily.    [provider]  clobetasol (TEMOVATE) 0.05 % external solution APPLY TO AFFECTED AREA TWICE A DAY 09/01/20   Bacigalupo, Dionne Bucy, MD  COSENTYX SENSOREADY, 300 MG, 150 MG/ML SOAJ Inject 2 Syringes into the skin every 28 (twenty-eight) days. 05/31/20   [provider]  donepezil (ARICEPT) 10 MG tablet Take 10 mg by mouth daily. 07/01/20   [provider]  ferrous sulfate 325 (65 FE) MG tablet Take 325 mg by mouth 2 (two) times daily. 05/28/20   [provider]  folic acid (FOLVITE) 1 MG tablet Take 1 mg by mouth daily. 09/02/20   [provider]  furosemide (LASIX) 40 MG tablet Take 1 tablet (40 mg total) by mouth daily. If weight 127 lb or less, take 20 mg by mouth daily. 07/01/20   Minna Merritts, MD  gabapentin (NEURONTIN) 300 MG capsule Take 1 capsule (300 mg total) by mouth 2 (two) times daily. 10/08/20   [name], MD  levocetirizine (XYZAL) 5 MG tablet TAKE 1 TABLET BY MOUTH EVERY DAY IN THE EVENING 12/18/20   Bacigalupo, Dionne Bucy, MD  methotrexate (RHEUMATREX) 2.5 MG tablet Take 15 mg by mouth once a week.  05/23/20   [provider]  metoprolol succinate (TOPROL-XL) 50 MG 24 hr tablet TAKE 1 TABLET BY MOUTH DAILY. TAKE WITH OR IMMEDIATELY FOLLOWING A MEAL. 11/25/20   Bacigalupo, Dionne Bucy, MD  pantoprazole (PROTONIX) 20 MG tablet TAKE 1 TABLET (20 MG TOTAL) BY  MOUTH 2 (TWO) TIMES DAILY BEFORE A MEAL. 06/26/20   Bacigalupo, Dionne Bucy, MD  potassium chloride SA (KLOR-CON) 20 MEQ tablet Take 2 tablets (40 mEq total) by mouth daily. 06/06/20   Nita Sells, MD  rOPINIRole (REQUIP) 1 MG tablet TAKE 1 TABLET BY MOUTH AT BEDTIME. 01/02/21   Bacigalupo, Dionne Bucy, MD  simvastatin (ZOCOR) 20 MG tablet TAKE 1 TABLET BY MOUTH EVERY DAY Patient taking differently: Take 20 mg by mouth daily at 6 PM. 09/04/20   Bacigalupo, Dionne Bucy, MD  traMADol (ULTRAM) 50 MG tablet TAKE 1 TABLET BY MOUTH EVERY 6  HOURS AS NEEDED FOR MODERATE OR SEVERE PAIN 12/18/20   [name], MD    Current Facility-Administered Medications  Medication Dose Route Frequency Provider Last Rate Last Admin   0.9 %  sodium chloride infusion   Intravenous PRN Eugenie Filler, MD   Stopped at 01/12/21 0230   acetaminophen (TYLENOL) tablet 650 mg  650 mg Oral Q6H PRN Howerter, Justin B, DO       Or   acetaminophen (TYLENOL) suppository 650 mg  650 mg Rectal Q6H PRN Howerter, Justin B, DO       albuterol (PROVENTIL) (2.5 MG/3ML) 0.083% nebulizer solution 2.5 mg  2.5 mg Inhalation Q6H PRN Howerter, Justin B, DO       cetirizine (ZYRTEC) tablet 5 mg  5 mg Oral QPM Eugenie Filler, MD   5 mg at 01/11/21 1710   ciprofloxacin (CIPRO) IVPB 400 mg  400 mg Intravenous Q12H Howerter, Justin B, DO 200 mL/hr at 01/12/21 1348 400 mg at 01/12/21 1348   donepezil (ARICEPT) tablet 10 mg  10 mg Oral Daily Eugenie Filler, MD   10 mg at 01/12/21 0842   famotidine (PEPCID) tablet 40 mg  40 mg Oral Daily Eugenie Filler, MD   40 mg at 01/12/21 0841   fentaNYL (SUBLIMAZE) injection 12.5 mcg  12.5 mcg Intravenous Q2H PRN Howerter, Justin B, DO       ferrous sulfate tablet 325 mg  325 mg Oral BID Eugenie Filler, MD   325 mg at 01/12/21 0841   fluticasone furoate-vilanterol (BREO ELLIPTA) 100-25 MCG/INH 1 puff  1 puff Inhalation Daily Howerter, Justin B, DO   1 puff at 54/00/86 7619   folic acid  (FOLVITE) tablet 1 mg  1 mg Oral Daily Eugenie Filler, MD   1 mg at 01/12/21 0841   gabapentin (NEURONTIN) capsule 300 mg  300 mg Oral BID Eugenie Filler, MD   300 mg at 01/12/21 5093   metoprolol succinate (TOPROL-XL) 24 hr tablet 50 mg  50 mg Oral Daily Eugenie Filler, MD   50 mg at 01/12/21 0841   metroNIDAZOLE (FLAGYL) IVPB 500 mg  500 mg Intravenous Q8H Howerter, Justin B, DO 100 mL/hr at 01/12/21 0843 500 mg at 01/12/21 0843   naloxone (NARCAN) injection 0.4 mg  0.4 mg Intravenous PRN Howerter, Justin B, DO       rOPINIRole (REQUIP) tablet 1 mg  1 mg Oral QHS Howerter, Justin B, DO   1 mg at 01/11/21 2212   traMADol (ULTRAM) tablet 50 mg  50 mg Oral Q6H PRN Mansy, Jan A, MD   50 mg at 01/12/21 0841    Allergies as of 01/10/2021 - Review Complete 01/10/2021  Allergen Reaction Noted   Amoxicillin Diarrhea 04/12/2018   Clindamycin/lincomycin  05/30/2018     Review of Systems:    All systems reviewed and negative except where noted in HPI.  Review of Systems  Constitutional:  Negative for chills and fever.  Respiratory:  Negative for cough.   Cardiovascular:  Negative for chest pain.  Gastrointestinal:  Negative for abdominal pain, blood in stool, constipation, diarrhea, melena, nausea and vomiting.  Musculoskeletal:  Positive for joint pain.  Skin:  Negative for rash.  Psychiatric/Behavioral:  Negative for substance abuse.   All other systems reviewed and are negative.     Physical Exam:  Vital signs in last 24 hours: Temp:  [97.6 F (36.4 C)-98.7 F (37.1 C)] 98.7 F (37.1 C) (06/26 0800) Pulse  Rate:  [62-86] 72 (06/26 0800) Resp:  [17-20] 18 (06/26 0800) BP: (122-148)/(66-90) 148/90 (06/26 0800) SpO2:  [99 %-100 %] 100 % (06/26 0800) Weight:  [54.2 kg] 54.2 kg (06/26 0459) Last BM Date: 01/11/21 General:   Pleasant in NAD Head:  Normocephalic and atraumatic. Eyes:   No icterus.   Conjunctiva pink. Mouth: Mucosa pink moist, no lesions. Neck:  Supple; no  masses felt Lungs:  No respiratory distress Heart:  RRR Abdomen:   Non-tender, non-distended Msk:  no clubbing Neurologic:  Alert and  oriented x4;  Cranial nerves II-XII intact.  Skin:  Warm, dry, pink without significant lesions or rashes. Psych:  Alert and cooperative. Normal affect.  LAB RESULTS: Recent Labs    01/10/21 1653 01/11/21 0627 01/12/21 0605  WBC 3.6* 3.2* 2.7*  HGB 16.5* 14.3 14.2  HCT 48.5* 41.4 41.1  PLT 110* 89* 77*   BMET Recent Labs    01/10/21 1653 01/11/21 0627 01/12/21 0605  NA 136 136 137  K 3.4* 3.8 3.5  CL 100 104 107  CO2 29 26 24   GLUCOSE 99 84 104*  BUN 10 10 6*  CREATININE 1.03* 1.02* 0.89  CALCIUM 8.4* 8.4* 8.2*   LFT Recent Labs    01/11/21 0627 01/12/21 0605  PROT 5.3* 4.9*  ALBUMIN 2.6* 2.4*  AST 117* 132*  ALT 74* 73*  ALKPHOS 204* 197*  BILITOT 2.2* 1.9*  BILIDIR 0.9*  --    PT/INR No results for input(s): LABPROT, INR in the last 72 hours.  STUDIES: CT ABDOMEN PELVIS W CONTRAST  Result Date: 01/10/2021 CLINICAL DATA:  Diarrhea.  Abdominal pain. EXAM: CT ABDOMEN AND PELVIS WITH CONTRAST TECHNIQUE: Multidetector CT imaging of the abdomen and pelvis was performed using the standard protocol following bolus administration of intravenous contrast. CONTRAST:  74m OMNIPAQUE IOHEXOL 300 MG/ML  SOLN COMPARISON:  Most recent abdominopelvic CT 04/24/2020 FINDINGS: Lower chest: Unchanged 6 mm right lower lobe pulmonary nodule. Unchanged 4 mm right middle lobe pulmonary nodule. No acute airspace disease or pleural effusion. Hepatobiliary: Diffuse hepatic low density typical of steatosis. There is a stable 12 mm enhancing focus in the right lobe of the liver, series 2, image 25 consistent with flash filling hemangioma or AP shunt. No new hepatic lesion. Layering stones or sludge in the gallbladder. There is a Phrygian cap. Equivocal gallbladder wall thickening, series 2, image 31. No biliary dilatation. Pancreas: Mild fatty atrophy.  No  ductal dilatation or inflammation. Spleen: Normal in size without focal abnormality. Splenule at the hilum. Adrenals/Urinary Tract: Minimal right adrenal thickening. No dominant adrenal mass. No hydronephrosis or perinephric edema. Mild cortical scarring in the upper right kidney. No renal calculi or focal lesion. Urinary bladder is physiologically distended without wall thickening. Portions of the urinary bladder particularly in the right are obscured by streak artifact from right hip arthroplasty. Stomach/Bowel: The stomach is decompressed which limits detailed gastric assessment. There is no small bowel obstruction or inflammation. No obvious small bowel wall thickening. Patient has history of appendectomy. There is a blind-ending structure arising from the cecum coursing posteriorly, 2.3 cm in length, 9 mm transverse and fluid-filled, series 2, image 52. There is mild adjacent fat stranding. No enterolith. There is submucosal fatty infiltration involving the cecum and ascending colon. Formed stool in the transverse and descending colon. Small amount of liquid stool in the sigmoid colon with equivocal wall thickening, series 2, image 56. Vascular/Lymphatic: Dense aortic atherosclerosis. No aortic aneurysm. No acute aortic findings. Patent portal and  splenic veins. No portal venous gas. No enlarged lymph nodes in the abdomen or pelvis, right pelvis assessment partially obscured by streak artifact. Reproductive: Possible atrophic uterus, pelvis partially obscured by streak artifact. There is no left adnexal mass. Other: No free air or free fluid. Tiny fat containing umbilical hernia. 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 Right hip arthroplasty. Diminished sclerosis involving the right pubic body from prior exam. No acute osseous abnormalities are seen IMPRESSION: 1. Patient has listed history of appendectomy. There is a 2.3 cm tubular structure arising from the cecum measuring 9 mm in thickness at is  fluid-filled with mild adjacent fat stranding. Differential considerations include cecal diverticulitis or stump appendicitis. 2. Small amount of liquid stool in the sigmoid colon with equivocal wall thickening, can be seen with diarrheal illness. 3. Hepatic steatosis. Stable flash filling hemangioma or AP shunt in the right lobe of the liver. 4. Layering stones or sludge in the gallbladder with equivocal gallbladder wall thickening. If there is clinical concern for acute cholecystitis, recommend right upper quadrant ultrasound for further evaluation. 5. Stable pulmonary nodules at the right lung base. Aortic Atherosclerosis (ICD10-I70.0). Electronically Signed   By: Keith Rake M.D.   On: 01/10/2021 20:09   US ABDOMEN LIMITED RUQ (LIVER/GB)  Result Date: 01/11/2021 CLINICAL DATA:  Acute transaminitis EXAM: ULTRASOUND ABDOMEN LIMITED RIGHT UPPER QUADRANT COMPARISON:  None. FINDINGS: Gallbladder: Mobile stones and sludge in the gallbladder. The gallbladder wall measures 4 mm. No Murphy's sign or pericholecystic fluid. Common bile duct: Diameter: 3 mm Liver: Increased echogenicity with no focal mass. Portal vein is patent on color Doppler imaging with normal direction of blood flow towards the liver. Other: None. IMPRESSION: 1. Mobile stones and sludge in the gallbladder with mild wall thickening but no Murphy's sign or pericholecystic fluid. If the clinical picture remains ambiguous, a HIDA scan could further evaluate. 2. Increased echogenicity in the liver is nonspecific but often due to hepatic steatosis. Electronically Signed   By: Dorise Bullion III M.D   On: 01/11/2021 10:58       Impression / Plan:   75 y/o lady with psoriatic arthritis with history of methotrexate use here for diarrhea that resolved but has had persist mild elevation in liver enzymes. On chart review she had occasional elevation in Al[place] phos and more recently has developed thrombocytopenia over the past six months along with a  low albumin. In the acute setting her abnormal liver enzymes are likely multifactorial from medication (statin, recent antibiotic), dehydration, and or viral illness. 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  Raylene Miyamoto MD, MPH [place]

## 2021-01-13 ENCOUNTER — Inpatient Hospital Stay: Payer: PPO

## 2021-01-13 LAB — MAGNESIUM: Magnesium: 1.9 mg/dL (ref 1.7–2.4)

## 2021-01-13 LAB — COMPREHENSIVE METABOLIC PANEL
ALT: 85 U/L — ABNORMAL HIGH (ref 0–44)
AST: 170 U/L — ABNORMAL HIGH (ref 15–41)
Albumin: 2.7 g/dL — ABNORMAL LOW (ref 3.5–5.0)
Alkaline Phosphatase: 231 U/L — ABNORMAL HIGH (ref 38–126)
Anion gap: 5 (ref 5–15)
BUN: 5 mg/dL — ABNORMAL LOW (ref 8–23)
CO2: 26 mmol/L (ref 22–32)
Calcium: 8.2 mg/dL — ABNORMAL LOW (ref 8.9–10.3)
Chloride: 101 mmol/L (ref 98–111)
Creatinine, Ser: 0.87 mg/dL (ref 0.44–1.00)
GFR, Estimated: >60 mL/min (ref >60)
Glucose, Bld: 83 mg/dL (ref 70–99)
Potassium: 3.6 mmol/L (ref 3.5–5.1)
Sodium: 132 mmol/L — ABNORMAL LOW (ref 135–145)
Total Bilirubin: 2.4 mg/dL — ABNORMAL HIGH (ref 0.3–1.2)
Total Protein: 5.4 g/dL — ABNORMAL LOW (ref 6.5–8.1)

## 2021-01-13 LAB — CBC WITH DIFFERENTIAL/PLATELET
Abs Immature Granulocytes: 0.01 10*3/uL (ref 0.00–0.07)
Basophils Absolute: 0 10*3/uL (ref 0.0–0.1)
Basophils Relative: 0 %
Eosinophils Absolute: 0.3 10*3/uL (ref 0.0–0.5)
Eosinophils Relative: 7 %
HCT: 43 % (ref 36.0–46.0)
Hemoglobin: 14.7 g/dL (ref 12.0–15.0)
Immature Granulocytes: 0 %
Lymphocytes Relative: 17 %
Lymphs Abs: 0.6 10*3/uL — ABNORMAL LOW (ref 0.7–4.0)
MCH: 31.3 pg (ref 26.0–34.0)
MCHC: 34.2 g/dL (ref 30.0–36.0)
MCV: 91.7 fL (ref 80.0–100.0)
Monocytes Absolute: 0.3 10*3/uL (ref 0.1–1.0)
Monocytes Relative: 8 %
Neutro Abs: 2.5 10*3/uL (ref 1.7–7.7)
Neutrophils Relative %: 68 %
Platelets: 86 10*3/uL — ABNORMAL LOW (ref 150–400)
RBC: 4.69 MIL/uL (ref 3.87–5.11)
RDW: 13.8 % (ref 11.5–15.5)
Smear Review: NORMAL
WBC: 3.7 10*3/uL — ABNORMAL LOW (ref 4.0–10.5)
nRBC: 0 % (ref 0.0–0.2)

## 2021-01-13 LAB — LACTOFERRIN, FECAL, QUALITATIVE: Lactoferrin, Fecal, Qual: NEGATIVE

## 2021-01-13 MED ORDER — CIPROFLOXACIN HCL 500 MG PO TABS
500.0000 mg | ORAL_TABLET | Freq: Two times a day (BID) | ORAL | Status: DC
  Administered 2021-01-13 – 2021-01-14 (×3): 500 mg via ORAL
  Filled 2021-01-13 (×2): qty 1

## 2021-01-13 MED ORDER — METRONIDAZOLE 500 MG PO TABS
500.0000 mg | ORAL_TABLET | Freq: Three times a day (TID) | ORAL | Status: DC
  Administered 2021-01-13 – 2021-01-14 (×5): 500 mg via ORAL
  Filled 2021-01-13 (×7): qty 1

## 2021-01-13 MED ORDER — METRONIDAZOLE 500 MG PO TABS: 500.0000 mg | ORAL_TABLET | Freq: Three times a day (TID) | ORAL | 0 refills | Status: AC

## 2021-01-13 MED ORDER — POTASSIUM CHLORIDE CRYS ER 20 MEQ PO TBCR
20.0000 meq | EXTENDED_RELEASE_TABLET | Freq: Every day | ORAL | Status: DC
  Administered 2021-01-13 – 2021-01-14 (×2): 20 meq via ORAL
  Filled 2021-01-13 (×2): qty 1

## 2021-01-13 MED ORDER — CIPROFLOXACIN HCL 500 MG PO TABS: 500.0000 mg | ORAL_TABLET | Freq: Two times a day (BID) | ORAL | 0 refills | Status: AC

## 2021-01-13 MED ORDER — POTASSIUM CHLORIDE CRYS ER 20 MEQ PO TBCR: 40.0000 meq | EXTENDED_RELEASE_TABLET | Freq: Every day | ORAL | Status: DC

## 2021-01-13 MED ORDER — FUROSEMIDE 40 MG PO TABS: 40.0000 mg | ORAL_TABLET | Freq: Every day | ORAL | 3 refills | Status: DC

## 2021-01-13 MED ORDER — GADOBUTROL 1 MMOL/ML IV SOLN
5.0000 mL | Freq: Once | INTRAVENOUS | Status: AC | PRN
  Administered 2021-01-13: 5 mL via INTRAVENOUS

## 2021-01-13 MED ORDER — LOPERAMIDE HCL 2 MG PO CAPS: 2.0000 mg | ORAL_CAPSULE | ORAL | 0 refills | Status: DC | PRN

## 2021-01-13 NOTE — Progress Notes (Signed)
PROGRESS NOTE    Mariah [name]  BEE:[id number] DOB: 07/01/1946 DOA: 01/10/2021 PCP: [name], MD    Chief Complaint  Patient presents with   Diarrhea    Brief Narrative:  Patient 75 year old female history of COPD, GERD, hyperlipidemia, psoriasis on Cosentyx and methotrexate, chronic diastolic heart failure presented to ED for evaluation for diarrhea and noted on presentation to have a acute transaminitis.  CT abdomen and pelvis done concerning for a cecal diverticulitis versus stump appendicitis.  Also concerning was cholelithiasis.  Abdominal ultrasound done to rule out acute cholecystitis.  Acute hepatitis panel pending.  Patient placed empirically on IV ciprofloxacin and IV Flagyl. GI consulted for transaminitis and recommending MRCP for further evaluation.   Assessment & Plan:   Principal Problem:   Transaminitis Active Problems:   Essential (primary) hypertension   Hypercholesteremia   Chronic diastolic CHF (congestive heart failure) (HCC)   Hypokalemia   Diarrhea   COPD (chronic obstructive pulmonary disease) (HCC)   Tobacco abuse   1 acute transaminitis -Questionable etiology.  Could likely be secondary to methotrexate versus dehydration as patient noted to have borderline blood pressure on admission and had multiple bouts of diarrhea.  -Patient states not sure when the last time she took methotrexate was but thinks she has not taken it in a while.  -Transaminitis fluctuating and trending back up. -Acute hepatitis panel is negative.   -CT abdomen and pelvis with cholelithiasis, no acute cholecystitis, cecal diverticulitis versus stump appendicitis. -Right upper quadrant abdominal ultrasound with mobile stones and sludge in the gallbladder with mild wall thickening with no Murphy sign no pericholecystic fluid.  Increased echogenicity in the liver nonspecific but could be due to hepatic steatosis. -On gentle hydration.   -Methotrexate on hold however patient  states not sure when she last had methotrexate and think she has been off of it for a while.  -Continue to hold statin. -Patient seen in consultation by GI who are recommending MRCP for further evaluation. -GI following and appreciate input and recommendations.  2.  Diarrhea ?  Questionable etiology. -C. difficile PCR negative.  GI pathogen panel negative. -Clinical improvement. -Imodium as needed. -Supportive care.  3.  Hypokalemia -Likely secondary to GI losses. -Magnesium at 1.9 -Potassium at 3.6. -Resume home dose oral daily potassium supplementation.  4.  Cecal diverticulitis versus stump appendicitis -Patient with no abdominal pain.   -GI pathogen panel negative.   -C. difficile PCR negative.   -Patient afebrile.   -White count at 3.7.   -Tolerating soft diet.   -Transition from IV ciprofloxacin and IV Flagyl to oral ciprofloxacin and oral Flagyl to complete a total of 7-day course of antibiotic treatment.  -GI following.  5.  COPD -Stable. -Continue home regimen Breo Ellipta, albuterol inhaler as needed.  6.  Tobacco abuse -Tobacco cessation.  7.  GERD -PPI on hold due to concerns for diarrhea. -Continue Pepcid.  8.  Hypertension -Patient with soft blood pressure. -Blood pressure improved with hydration.   -Continue Toprol-XL.  9.  Hyperlipidemia -Continue to hold statin.    10.  Chronic diastolic heart failure -Patient clinically dehydrated. -Continue to hold diuretics.   -Status post IV fluids.     DVT prophylaxis: SCDs Code Status: Full Family Communication: Updated patient.  No family at bedside.  Disposition:   Status is: Inpatient  Remains inpatient appropriate because:IV treatments appropriate due to intensity of illness or inability to take PO  Dispo: The patient is from: Home  Anticipated d/c is to: Home              Patient currently is not medically stable to d/c.   Difficult to place patient No       Consultants:   None  Procedures:  CT abdomen pelvis 01/10/2021 Abdominal ultrasound 01/11/2021   Antimicrobials:  IV Flagyl 01/11/2021>>>> oral Flagyl 01/13/2021>>> IV ciprofloxacin 01/11/2021>>>>> oral ciprofloxacin 01/13/2021>>>>   Subjective: Patient lying in bed.  No chest pain.  No shortness of breath.  No abdominal pain.  Diarrhea improving.  Feeling better.   Objective: Vitals:   01/12/21 2314 01/13/21 0305 01/13/21 0501 01/13/21 0732  BP: 140/84  113/62 127/62  Pulse: 67  70 70  Resp: 18  18 18   Temp: 97.9 F (36.6 C)  97.7 F (36.5 C) (!) 97.1 F (36.2 C)  TempSrc: Oral  Oral Oral  SpO2: 99%  99% 98%  Weight:  56.2 kg    Height:        Intake/Output Summary (Last 24 hours) at 01/13/2021 1120 Last data filed at 01/13/2021 0914 Gross per 24 hour  Intake 957.96 ml  Output 0 ml  Net 957.96 ml    Filed Weights   01/11/21 0500 01/12/21 0459 01/13/21 0305  Weight: 60.6 kg 54.2 kg 56.2 kg    Examination:  General exam: : NAD Respiratory system: CTA B.  No wheezes, no rhonchi.  Speaking in full sentences.  Normal respiratory effort. Cardiovascular system: Regular rate and rhythm with 3/6 SEM.  No JVD.  No lower extremity edema.  Gastrointestinal system: Abdomen soft, nontender, nondistended, positive bowel sounds.  No rebound.  No guarding. Central nervous system: Alert and oriented. No focal neurological deficits. Extremities: Symmetric 5 x 5 power. Skin: No rashes, lesions or ulcers Psychiatry: Judgement and insight appear normal. Mood & affect appropriate.  Data Reviewed: I have personally reviewed following labs and imaging studies  CBC: Recent Labs  Lab 01/10/21 1653 01/11/21 0627 01/12/21 0605 01/13/21 0603  WBC 3.6* 3.2* 2.7* 3.7*  NEUTROABS 2.3 2.0 1.7 2.5  HGB 16.5* 14.3 14.2 14.7  HCT 48.5* 41.4 41.1 43.0  MCV 93.3 91.0 90.3 91.7  PLT 110* 89* 77* 86*     Basic Metabolic Panel: Recent Labs  Lab 01/10/21 1653 01/11/21 0627 01/12/21 0605 01/13/21 0603   NA 136 136 137 132*  K 3.4* 3.8 3.5 3.6  CL 100 104 107 101  CO2 29 26 24 26   GLUCOSE 99 84 104* 83  BUN 10 10 6* 5*  CREATININE 1.03* 1.02* 0.89 0.87  CALCIUM 8.4* 8.4* 8.2* 8.2*  MG 2.1 1.9 1.9 1.9  PHOS  --  3.1  --   --      GFR: Estimated Creatinine Clearance: 43.2 mL/min (by C-G formula based on SCr of 0.87 mg/dL).  Liver Function Tests: Recent Labs  Lab 01/10/21 1653 01/11/21 0627 01/12/21 0605 01/13/21 0603  AST 150* 117* 132* 170*  ALT 88* 74* 73* 85*  ALKPHOS 226* 204* 197* 231*  BILITOT 2.5* 2.2* 1.9* 2.4*  PROT 5.9* 5.3* 4.9* 5.4*  ALBUMIN 2.8* 2.6* 2.4* 2.7*     CBG: No results for input(s): GLUCAP in the last 168 hours.   Recent Results (from the past 240 hour(s))  Resp Panel by RT-PCR (Flu A&B, Covid) Nasopharyngeal Swab     Status: None   Collection Time: 01/10/21  8:00 PM   Specimen: Nasopharyngeal Swab; Nasopharyngeal(NP) swabs in vial transport medium  Result Value Ref Range Status  SARS Coronavirus 2 by RT PCR NEGATIVE NEGATIVE Final    Comment: (NOTE) SARS-CoV-2 target nucleic acids are NOT DETECTED.  The SARS-CoV-2 RNA is generally detectable in upper respiratory specimens during the acute phase of infection. The lowest concentration of SARS-CoV-2 viral copies this assay can detect is 138 copies/mL. A negative result does not preclude SARS-Cov-2 infection and should not be used as the sole basis for treatment or other patient management decisions. A negative result may occur with  improper specimen collection/handling, submission of specimen other than nasopharyngeal swab, presence of viral mutation(s) within the areas targeted by this assay, and inadequate number of viral copies(<138 copies/mL). A negative result must be combined with clinical observations, patient history, and epidemiological information. The expected result is Negative.  Fact Sheet for Patients:  EntrepreneurPulse.com.au  Fact Sheet for  Healthcare Providers:  IncredibleEmployment.be  This test is no t yet approved or cleared by the [place] FDA and  has been authorized for detection and/or diagnosis of SARS-CoV-2 by FDA under an Emergency Use Authorization (EUA). This EUA will remain  in effect (meaning this test can be used) for the duration of the COVID-19 declaration under Section 564(b)(1) of the Act, 21 U.S.C.section 360bbb-3(b)(1), unless the authorization is terminated  or revoked sooner.       Influenza A by PCR NEGATIVE NEGATIVE Final   Influenza B by PCR NEGATIVE NEGATIVE Final    Comment: (NOTE) The Xpert Xpress SARS-CoV-2/FLU/RSV plus assay is intended as an aid in the diagnosis of influenza from Nasopharyngeal swab specimens and should not be used as a sole basis for treatment. Nasal washings and aspirates are unacceptable for Xpert Xpress SARS-CoV-2/FLU/RSV testing.  Fact Sheet for Patients: EntrepreneurPulse.com.au  Fact Sheet for Healthcare Providers: IncredibleEmployment.be  This test is not yet approved or cleared by the [place] FDA and has been authorized for detection and/or diagnosis of SARS-CoV-2 by FDA under an Emergency Use Authorization (EUA). This EUA will remain in effect (meaning this test can be used) for the duration of the COVID-19 declaration under Section 564(b)(1) of the Act, 21 U.S.C. section 360bbb-3(b)(1), unless the authorization is terminated or revoked.  Performed at [facility], [address] [place], [place] [address]   Gastrointestinal Panel by PCR , Stool     Status: None   Collection Time: 01/11/21  1:58 PM   Specimen: Stool  Result Value Ref Range Status   Campylobacter species NOT DETECTED NOT DETECTED Final   Plesimonas shigelloides NOT DETECTED NOT DETECTED Final   Salmonella species NOT DETECTED NOT DETECTED Final   Yersinia enterocolitica NOT DETECTED NOT DETECTED Final    Vibrio species NOT DETECTED NOT DETECTED Final   Vibrio cholerae NOT DETECTED NOT DETECTED Final   Enteroaggregative E coli (EAEC) NOT DETECTED NOT DETECTED Final   Enteropathogenic E coli (EPEC) NOT DETECTED NOT DETECTED Final   Enterotoxigenic E coli (ETEC) NOT DETECTED NOT DETECTED Final   Shiga like toxin producing E coli (STEC) NOT DETECTED NOT DETECTED Final   Shigella/Enteroinvasive E coli (EIEC) NOT DETECTED NOT DETECTED Final   Cryptosporidium NOT DETECTED NOT DETECTED Final   Cyclospora cayetanensis NOT DETECTED NOT DETECTED Final   Entamoeba histolytica NOT DETECTED NOT DETECTED Final   Giardia lamblia NOT DETECTED NOT DETECTED Final   Adenovirus F40/41 NOT DETECTED NOT DETECTED Final   Astrovirus NOT DETECTED NOT DETECTED Final   Norovirus GI/GII NOT DETECTED NOT DETECTED Final   Rotavirus A NOT DETECTED NOT DETECTED Final   Sapovirus (I,  II, IV, and V) NOT DETECTED NOT DETECTED Final    Comment: Performed at [facility], [place], [place] [address]  C Difficile Quick Screen w PCR reflex     Status: None   Collection Time: 01/11/21  1:58 PM   Specimen: STOOL  Result Value Ref Range Status   C Diff antigen NEGATIVE NEGATIVE Final   C Diff toxin NEGATIVE NEGATIVE Final   C Diff interpretation No C. difficile detected.  Final    Comment: Performed at [facility], [address] [place], [place] [address]          Radiology Studies: No results found.      Scheduled Meds:  cetirizine  5 mg Oral QPM   ciprofloxacin  500 mg Oral BID   donepezil  10 mg Oral Daily   famotidine  40 mg Oral Daily   ferrous sulfate  325 mg Oral BID   fluticasone furoate-vilanterol  1 puff Inhalation Daily   folic acid  1 mg Oral Daily   gabapentin  300 mg Oral BID   metoprolol succinate  50 mg Oral Daily   metroNIDAZOLE  500 mg Oral Q8H   rOPINIRole  1 mg Oral QHS   Continuous Infusions:  sodium chloride Stopped (01/13/21 0503)     LOS: 3  days    Time spent: 35 minutes    Irine Seal, MD [facility]   To contact the attending provider between 7A-7P or the covering provider during after hours 7P-7A, please log into the web site [email or web address] and access using universal [place] password for that web site. If you do not have the password, please call the hospital operator.  01/13/2021, 11:20 AM

## 2021-01-13 NOTE — Care Management Important Message (Signed)
Important Message  Patient Details  Name: Mariah [name] MRN: [id number] Date of Birth: Dec 31, 1945   Medicare Important Message Given:  N/A - LOS <3 / Initial given by admissions     Dannette Barbara 01/13/2021, 12:02 PM

## 2021-01-13 NOTE — Progress Notes (Signed)
GI Inpatient Follow-up Note  Subjective:  Patient seen and doing well but liver enzymes held steady/slightly worsened. No significant symptoms.  Scheduled Inpatient Medications:   cetirizine  5 mg Oral QPM   ciprofloxacin  500 mg Oral BID   donepezil  10 mg Oral Daily   famotidine  40 mg Oral Daily   ferrous sulfate  325 mg Oral BID   fluticasone furoate-vilanterol  1 puff Inhalation Daily   folic acid  1 mg Oral Daily   gabapentin  300 mg Oral BID   metoprolol succinate  50 mg Oral Daily   metroNIDAZOLE  500 mg Oral Q8H   potassium chloride  20 mEq Oral Daily   rOPINIRole  1 mg Oral QHS    Continuous Inpatient Infusions:    sodium chloride Stopped (01/13/21 0503)    PRN Inpatient Medications:  sodium chloride, acetaminophen **OR** acetaminophen, albuterol, fentaNYL (SUBLIMAZE) injection, loperamide, naLOXone (NARCAN)  injection, traMADol  Review of Systems:  Review of Systems  Constitutional:  Negative for chills and fever.  Respiratory:  Negative for cough.   Cardiovascular:  Negative for chest pain.  Gastrointestinal:  Negative for constipation and diarrhea.  Musculoskeletal:  Negative for joint pain.  Skin:  Negative for rash.  Neurological:  Negative for focal weakness.  Psychiatric/Behavioral:  Negative for substance abuse.   All other systems reviewed and are negative.    Physical Examination: BP 119/60 (BP Location: Right Arm)   Pulse (!) 58   Temp 97.8 F (36.6 C)   Resp 18   Ht 5' (1.524 m)   Wt 56.2 kg   SpO2 (!) 88%   BMI 24.18 kg/m  Gen: NAD, alert and oriented x 4 HEENT: PEERLA, EOMI, Neck: supple, no JVD or thyromegaly Chest: no respiratory distress CV: RRR Abd: soft, non-tender, non-distended Ext: no edema, well perfused with 2+ pulses, Skin: no rash or lesions noted Lymph: no LAD  Data: Lab Results  Component Value Date   WBC 3.7 (L) 01/13/2021   HGB 14.7 01/13/2021   HCT 43.0 01/13/2021   MCV 91.7 01/13/2021   PLT 86 (L)  01/13/2021   Recent Labs  Lab 01/11/21 0627 01/12/21 0605 01/13/21 0603  HGB 14.3 14.2 14.7   Lab Results  Component Value Date   NA 132 (L) 01/13/2021   K 3.6 01/13/2021   CL 101 01/13/2021   CO2 26 01/13/2021   BUN 5 (L) 01/13/2021   CREATININE 0.87 01/13/2021   GLU 89 07/25/2018   Lab Results  Component Value Date   ALT 85 (H) 01/13/2021   AST 170 (H) 01/13/2021   GGT 160 (H) 01/11/2021   ALKPHOS 231 (H) 01/13/2021   BILITOT 2.4 (H) 01/13/2021   No results for input(s): APTT, INR, PTT in the last 168 hours. Assessment/Plan: Ms. Pinon is a 75 y.o. female with history of RA and long term methotrexate use here for diarrhea that resolved and found to have abnormal LFT's of unclear etiology. I suspect she may have some form of fibrosis as she has thrombocytopenia and low albumin. Differential includes meds, acute on chronic liver failure, ischemic, and biliary obstruction  Recommendations:  - will get MRCP to make sure no biliary obstruction - will get some auto-immune labs - if MRCP unrevealing then can follow-up as an outpatient  Please call with any questions or concerns.  Raylene Miyamoto MD, MPH [place]

## 2021-01-14 LAB — CBC WITH DIFFERENTIAL/PLATELET
Abs Immature Granulocytes: 0.01 10*3/uL (ref 0.00–0.07)
Basophils Absolute: 0 10*3/uL (ref 0.0–0.1)
Basophils Relative: 0 %
Eosinophils Absolute: 0.2 10*3/uL (ref 0.0–0.5)
Eosinophils Relative: 6 %
HCT: 37.9 % (ref 36.0–46.0)
Hemoglobin: 13.2 g/dL (ref 12.0–15.0)
Immature Granulocytes: 0 %
Lymphocytes Relative: 20 %
Lymphs Abs: 0.5 10*3/uL — ABNORMAL LOW (ref 0.7–4.0)
MCH: 31.1 pg (ref 26.0–34.0)
MCHC: 34.8 g/dL (ref 30.0–36.0)
MCV: 89.4 fL (ref 80.0–100.0)
Monocytes Absolute: 0.2 10*3/uL (ref 0.1–1.0)
Monocytes Relative: 9 %
Neutro Abs: 1.7 10*3/uL (ref 1.7–7.7)
Neutrophils Relative %: 65 %
Platelets: 80 10*3/uL — ABNORMAL LOW (ref 150–400)
RBC: 4.24 MIL/uL (ref 3.87–5.11)
RDW: 13.8 % (ref 11.5–15.5)
WBC: 2.7 10*3/uL — ABNORMAL LOW (ref 4.0–10.5)
nRBC: 0 % (ref 0.0–0.2)

## 2021-01-14 LAB — COMPREHENSIVE METABOLIC PANEL
ALT: 75 U/L — ABNORMAL HIGH (ref 0–44)
AST: 150 U/L — ABNORMAL HIGH (ref 15–41)
Albumin: 2.5 g/dL — ABNORMAL LOW (ref 3.5–5.0)
Alkaline Phosphatase: 220 U/L — ABNORMAL HIGH (ref 38–126)
Anion gap: 4 — ABNORMAL LOW (ref 5–15)
BUN: 6 mg/dL — ABNORMAL LOW (ref 8–23)
CO2: 26 mmol/L (ref 22–32)
Calcium: 8.5 mg/dL — ABNORMAL LOW (ref 8.9–10.3)
Chloride: 107 mmol/L (ref 98–111)
Creatinine, Ser: 0.89 mg/dL (ref 0.44–1.00)
GFR, Estimated: >60 mL/min (ref >60)
Glucose, Bld: 87 mg/dL (ref 70–99)
Potassium: 4.2 mmol/L (ref 3.5–5.1)
Sodium: 137 mmol/L (ref 135–145)
Total Bilirubin: 1.8 mg/dL — ABNORMAL HIGH (ref 0.3–1.2)
Total Protein: 4.9 g/dL — ABNORMAL LOW (ref 6.5–8.1)

## 2021-01-14 LAB — MAGNESIUM: Magnesium: 2 mg/dL (ref 1.7–2.4)

## 2021-01-14 LAB — PROTIME-INR
INR: 1.2 (ref 0.8–1.2)
Prothrombin Time: 15.1 seconds (ref 11.4–15.2)

## 2021-01-14 NOTE — Discharge Summary (Signed)
Physician Discharge Summary  Mariah [name]:[id number] DOB: Jul 22, 1945 DOA: 01/10/2021  PCP: Mariah Crews, MD  Admit date: 01/10/2021 Discharge date: 01/14/2021  Time spent: 60 minutes  Recommendations for Outpatient Follow-up:  Follow-up with Mariah Crews, MD in 1 to 2 weeks.  On follow-up patient will need a comprehensive metabolic profile done to follow-up on electrolytes, renal function, LFTs, CBC done, magnesium level checked.  Patient will need further follow-up on murmur noted. Follow-up with Dr. Haig Prophet, gastroenterology in 2 to 3 weeks for follow-up on transaminitis, autoimmune labs, further evaluation.[name]   Discharge Diagnoses:  Principal Problem:   Transaminitis Active Problems:   Essential (primary) hypertension   Hypercholesteremia   Chronic diastolic CHF (congestive heart failure) (HCC)   Hypokalemia   Diarrhea   COPD (chronic obstructive pulmonary disease) ([place])   Tobacco abuse   Discharge Condition: Stable and improved  Diet recommendation: Heart healthy  Filed Weights   01/12/21 0459 01/13/21 0305 01/14/21 0500  Weight: 54.2 kg 56.2 kg 57.1 kg    History of present illness:  HPI per Dr. Nyra Market is a 75 y.o. female with medical history significant for COPD, GERD, hyperlipidemia, psoriasis on Cosentyx and methotrexate, chronic diastolic heart failure, who is admitted to [facility] on 01/10/2021 for further evaluation and management of acute transaminitis at presenting from home to [facility] ED complaining of diarrhea.    The patient reported 4 to 5 days loose watery stools at a frequency of 3-5 episodes per day over that timeframe.  She denied any associated abdominal pain.  No melena or hematochezia.  No recent trauma or travel.  Denied any associated nausea/vomiting.  She also denied any associated subjective fever, chills, rigors, or generalized myalgias.  No recent headache, neck stiffness, rhinitis, rhinorrhea,  sore throat, shortness of breath, wheezing, cough, rash.  Denies any recent dysuria, gross hematuria, or change in urinary urgency/frequency.  No recent chest pain, diaphoresis, palpitations, dizziness, presyncope, or syncope.   In terms of alcohol consumption, the patient reported that this is limited to rare consumption of a glass of wine, at a frequency of less than 1 glass of wine per month, without any recent increase in frequency of alcohol consumption relative to this reported baseline.  Denies any use of recreational drugs.  No recent unintentional weight loss or jaundice.   The patient confirmed a history of GERD, for which she is on Protonix twice daily as an outpatient.  Additionally, she confirms history of hyperlipidemia, for which she is on simvastatin at home.  While she confirms being on both these medications, she is unsure as to how long she has been taking them.  Medical history also notable for psoriasis, for which she is on Cosentyx as well as methotrexate.   Medical history also notable for history of chronic diastolic heart failure, most recent echocardiogram performed in March 2022 showing LVEF 55 to 60% without evidence of focal wall motion abnormalities, and also showing evidence of grade 1 diastolic dysfunction in the absence of any evidence of significant valvular pathology.  Outpatient diuretic regimen consists of Lasix 40 mg p.o. daily.  Patient denies any recent shortness of breath, orthopnea, PND, or recent development of peripheral edema, and notes that she has continued to take her home Lasix in spite of recent development of diarrhea, as above.   Per chart review, the patient has a mild, chronically elevated alkaline phosphatase level of between 95-2 40 dating back to 2016, with most recent  prior value noted to be 135 in February 2022.  However, in reviewing her labs back through 2016, she has not previously had elevation of her AST, ALT, or total bilirubin, with most  recent corresponding values in February 2022 found to be 21, 20, and 0.7.  Additionally, per chart review, the patient has a history of interim leukopenia dating back to November 2021.           ED Course:  Vital signs in the ED were notable for the following: Tetramex 98.5, heart rate initially noted to be 96, with interval improvement to 80 following IV fluid administration, as further quantified below; blood pressure 116/66 -154/71; respiratory rate 16-18, oxygen saturation 96 to 98% on room air.   Labs were notable for the following: CMP notable for the following: Sodium 136, potassium 3.4, bicarbonate 29, anion gap 7, creatinine 1.03 relative to baseline creatinine of 0.9-1.2, albumin 2.8, alkaline phosphatase 226, AST 150, ALT 88, total bilirubin 2.5.  Lipase 26.  CBC notable for white blood cell count of 3600 relative to most recent prior value of 7800 on 10/10/2020, hemoglobin 16.5.  Urinalysis showed no white blood cells, nitrate negative, and leukocyte Estrace negative.  Screening nasopharyngeal COVID-19 / influenza PCR was performed in the ED this evening and found to be negative.   CT abdomen/pelvis with contrast demonstrated the following: Fluid-filled tubular structure arising from the cecum measuring 9 mm with adjacent fat stranding, potentially representing cecal diverticulitis or stump appendicitis in the setting of a documented history of appendectomy; sigmoid colon wall thickening, which was felt to be consistent with diarrheal illness; layering stones/sludge in the gallbladder with equivocal gallbladder wall thickening, without evidence of pericholecystic fluid, and in the absence of common bile duct dilation or overt choledocholithiasis.   While in the ED, the following were administered: Zosyn; 500 cc normal saline bolus.     Hospital Course:  1 acute transaminitis -Questionable etiology.  Could likely be secondary to methotrexate versus dehydration as patient noted to have  borderline blood pressure on admission and had multiple bouts of diarrhea. -Patient states not sure when the last time she took methotrexate was but thinks she has not taken it in a while.  -Transaminitis fluctuating.  -Acute hepatitis panel is negative.   -CT abdomen and pelvis with cholelithiasis, no acute cholecystitis, cecal diverticulitis versus stump appendicitis. -Right upper quadrant abdominal ultrasound with mobile stones and sludge in the gallbladder with mild wall thickening with no Murphy sign no pericholecystic fluid.  Increased echogenicity in the liver nonspecific but could be due to hepatic steatosis. -Patient gently hydrated during the hospitalization.  -Methotrexate was held during the hospitalization, however patient stated not sure when she last had methotrexate and think she has been off of it for a while.  - -Statin discontinued.   -GI consulted patient seen in consultation by Dr. Haig Prophet who felt transaminitis was likely multifactorial from medication (statin, recent antibiotic ), dehydration, viral illness.   -GI concern for possible fibrosis based on low albumin and thrombocytopenia either from NAFLD or methotrexate use.  -GI recommended discontinuation of methotrexate and statin,  -MRCP was done which had cholelithiasis, without associated inflammatory changes to suggest acute cholecystitis.  No intrahepatic or extrahepatic ductal dilatation.  Common duct measures 5 mm.  No choledocholithiasis seen. -Autoimmune labs were obtained per GI during the hospitalization and pending by time of discharge which will be followed up upon by GI. -Outpatient follow-up for further work-up for potential advanced fibrosis.   -Patient  was discharged in stable and improved condition will follow up with GI in the outpatient setting.   2.  Diarrhea ?  Questionable etiology. -C. difficile PCR negative.  GI pathogen panel negative. -Patient improved clinically.   -Patient maintained on  Imodium as needed.   -Outpatient follow-up with PCP.    3.  Hypokalemia -Likely secondary to GI losses. -Magnesium at 1.9 -Potassium repleted.   -Diuretics were held during the hospitalization.   -Oral potassium supplementation will be resumed on discharge 2-3 days post discharge once diuretics are resumed.   -Outpatient follow-up with PCP.    4.  Cecal diverticulitis versus stump appendicitis -Patient with no abdominal pain.   -GI pathogen panel negative.   -C. difficile PCR negative.   -Patient afebrile.   -White count at 2.7 by day of discharge.   -Patient initially placed on bowel rest subsequently advanced to a full liquid diet which she tolerated.   -Diarrhea improved during the hospitalization.   -Patient placed empirically on IV ciprofloxacin and Flagyl and transitioned to oral ciprofloxacin and Flagyl for 5 more days to complete a 7-day course of treatment.   -Outpatient follow-up with PCP.   -Outpatient follow-up with GI.  5.  COPD -Stable. -Patient maintained on home regimen Breo Ellipta, albuterol inhaler as needed.  6.  Tobacco abuse -Tobacco cessation.  7.  GERD -PPI on hold due to concerns for diarrhea. -Patient maintained on Pepcid during the hospitalization.   -PPI will be resumed on discharge.   8.  Hypertension -Patient with soft blood pressure elevation during the hospitalization. -Diuretics held and patient gently hydrated with IV fluids. -Home regimen Toprol-XL resumed. -Lasix will be resumed 2-3 days post discharge. -Outpatient follow-up with PCP.[name]   9.  Hyperlipidemia -Statin discontinued during the hospitalization due to transaminitis.   -Outpatient follow-up.    10.  Chronic diastolic heart failure -Patient more clinically dehydrated on presentation and as such diuretics were held. -Patient gently hydrated with IV fluids. -Diuretics will be resumed 2-3 days postdischarge.  11.  Murmur -Patient noted to have a murmur on examination during  the hospitalization. -Patient remained asymptomatic. -Patient noted to have a recent 2D echo done 09/18/2020 with EF of 55 to 65%,KCLE, grade 1 diastolic dysfunction, no mitral valve regurgitation or stenosis, no aortic valve stenosis. -Outpatient follow-up with PCP.  12.  Chronic thrombocytopenia -Could likely be related to transaminitis/questionable liver fibrosis.  Patient with no overt bleeding. -Outpatient follow-up with PCP and GI.      Procedures: CT abdomen pelvis 01/10/2021 Abdominal ultrasound 01/11/2021 MRCP 01/13/2021  Consultations: Gastroenterology: Dr. Haig Prophet 01/12/2021  Discharge Exam: Vitals:   01/14/21 0745 01/14/21 1112  BP: 100/63 130/72  Pulse: (!) 57 67  Resp: 16   Temp: (!) 97.1 F (36.2 C)   SpO2: 100%     General: NAD Cardiovascular: RRR with 3/6 SEM.  No JVD.  No lower extremity edema. Respiratory: CTA B.  Discharge Instructions   Discharge Instructions     Diet - low sodium heart healthy   Complete by: As directed    Increase activity slowly   Complete by: As directed       Allergies as of 01/14/2021       Reactions   Amoxicillin Diarrhea   Clindamycin/lincomycin    diarrhea        Medication List     STOP taking these medications    apixaban 5 MG Tabs tablet Commonly known as: ELIQUIS   methotrexate 2.5 MG tablet Commonly  known as: RHEUMATREX   simvastatin 20 MG tablet Commonly known as: ZOCOR       TAKE these medications    albuterol 108 (90 Base) MCG/ACT inhaler Commonly known as: VENTOLIN HFA INHALE 1-2 PUFFS INTO THE LUNGS EVERY 6 HOURS AS NEEDED FOR WHEEZING OR SHORTNESS OF BREATH.   betamethasone dipropionate 0.05 % ointment Commonly known as: DIPROLENE Apply topically daily.   Breo Ellipta 100-25 MCG/INH Aepb Generic drug: fluticasone furoate-vilanterol TAKE 1 PUFF BY MOUTH EVERY DAY   calcipotriene 0.005 % cream Commonly known as: DOVONOX APPLY TO SKIN ONCE A DAY APPLY DAILY ON HANDS, FEET AND  WRIST X 2 WEEKS, THEN USE 5 DAYS A WEEK   Calcipotriene 0.005 % solution APPLY 1 A SMALL AMOUNT TO SCALP ONCE A DAY APPLY TO SCALP DAILY FOR 2 WEEKS, THEN USE ON SCALP 5 DAYS A WEEK   ciprofloxacin 500 MG tablet Commonly known as: CIPRO Take 1 tablet (500 mg total) by mouth 2 (two) times daily for 5 days.   clobetasol 0.05 % external solution Commonly known as: TEMOVATE APPLY TO AFFECTED AREA TWICE A DAY   Cosentyx Sensoready (300 MG) 150 MG/ML Soaj Generic drug: Secukinumab (300 MG Dose) Inject 2 Syringes into the skin every 28 (twenty-eight) days.   donepezil 10 MG tablet Commonly known as: ARICEPT Take 10 mg by mouth daily.   ferrous sulfate 325 (65 FE) MG tablet Take 325 mg by mouth 2 (two) times daily.   folic acid 1 MG tablet Commonly known as: FOLVITE Take 1 mg by mouth daily.   furosemide 40 MG tablet Commonly known as: LASIX Take 1 tablet (40 mg total) by mouth daily. If weight 127 lb or less, take 20 mg by mouth daily. Start taking on: January 16, 2021 What changed: These instructions start on January 16, 2021. If you are unsure what to do until then, ask your doctor or other care provider.   gabapentin 300 MG capsule Commonly known as: NEURONTIN Take 1 capsule (300 mg total) by mouth 2 (two) times daily.   levocetirizine 5 MG tablet Commonly known as: XYZAL TAKE 1 TABLET BY MOUTH EVERY DAY IN THE EVENING   loperamide 2 MG capsule Commonly known as: IMODIUM Take 1 capsule (2 mg total) by mouth as needed for diarrhea or loose stools.   metoprolol succinate 50 MG 24 hr tablet Commonly known as: TOPROL-XL TAKE 1 TABLET BY MOUTH DAILY. TAKE WITH OR IMMEDIATELY FOLLOWING A MEAL.   metroNIDAZOLE 500 MG tablet Commonly known as: Flagyl Take 1 tablet (500 mg total) by mouth 3 (three) times daily for 5 days.   pantoprazole 20 MG tablet Commonly known as: PROTONIX TAKE 1 TABLET (20 MG TOTAL) BY MOUTH 2 (TWO) TIMES DAILY BEFORE A MEAL.   potassium chloride SA 20 MEQ  tablet Commonly known as: KLOR-CON Take 2 tablets (40 mEq total) by mouth daily. Start taking on: January 16, 2021 What changed: These instructions start on January 16, 2021. If you are unsure what to do until then, ask your doctor or other care provider.   rOPINIRole 1 MG tablet Commonly known as: REQUIP TAKE 1 TABLET BY MOUTH AT BEDTIME.   traMADol 50 MG tablet Commonly known as: ULTRAM TAKE 1 TABLET BY MOUTH EVERY 6 HOURS AS NEEDED FOR MODERATE OR SEVERE PAIN   Vitamin D3 50 MCG (2000 UT) Tabs Take 2,000 Units by mouth daily.               Durable Medical Equipment  (  From admission, onward)           Start     Ordered   01/13/21 1459  For home use only DME 3 n 1  Once        01/13/21 1458   01/12/21 0953  For home use only DME 3 n 1  Once        01/12/21 8588           Allergies  Allergen Reactions   Amoxicillin Diarrhea   Clindamycin/Lincomycin     diarrhea    Follow-up Information     Mariah Crews, MD. Schedule an appointment as soon as possible for a visit on 02/05/2021.   Specialty: Family Medicine Why: Follow-up in 1 to 2 weeks. 11am appointment Contact information: [address] [address] [phone]         Lesly Rubenstein, MD. Schedule an appointment as soon as possible for a visit in 2 week(s).   Specialty: Gastroenterology Why: f/u in 2-3 weeks Contact information: [place] [address] [phone]                  The results of significant diagnostics from this hospitalization (including imaging, microbiology, ancillary and laboratory) are listed below for reference.    Significant Diagnostic Studies: CT ABDOMEN PELVIS W CONTRAST  Result Date: 01/10/2021 CLINICAL DATA:  Diarrhea.  Abdominal pain. EXAM: CT ABDOMEN AND PELVIS WITH CONTRAST TECHNIQUE: Multidetector CT imaging of the abdomen and pelvis was performed using the standard protocol following bolus  administration of intravenous contrast. CONTRAST:  17mL OMNIPAQUE IOHEXOL 300 MG/ML  SOLN COMPARISON:  Most recent abdominopelvic CT 04/24/2020 FINDINGS: Lower chest: Unchanged 6 mm right lower lobe pulmonary nodule. Unchanged 4 mm right middle lobe pulmonary nodule. No acute airspace disease or pleural effusion. Hepatobiliary: Diffuse hepatic low density typical of steatosis. There is a stable 12 mm enhancing focus in the right lobe of the liver, series 2, image 25 consistent with flash filling hemangioma or AP shunt. No new hepatic lesion. Layering stones or sludge in the gallbladder. There is a Phrygian cap. Equivocal gallbladder wall thickening, series 2, image 31. No biliary dilatation. Pancreas: Mild fatty atrophy.  No ductal dilatation or inflammation. Spleen: Normal in size without focal abnormality. Splenule at the hilum. Adrenals/Urinary Tract: Minimal right adrenal thickening. No dominant adrenal mass. No hydronephrosis or perinephric edema. Mild cortical scarring in the upper right kidney. No renal calculi or focal lesion. Urinary bladder is physiologically distended without wall thickening. Portions of the urinary bladder particularly in the right are obscured by streak artifact from right hip arthroplasty. Stomach/Bowel: The stomach is decompressed which limits detailed gastric assessment. There is no small bowel obstruction or inflammation. No obvious small bowel wall thickening. Patient has history of appendectomy. There is a blind-ending structure arising from the cecum coursing posteriorly, 2.3 cm in length, 9 mm transverse and fluid-filled, series 2, image 52. There is mild adjacent fat stranding. No enterolith. There is submucosal fatty infiltration involving the cecum and ascending colon. Formed stool in the transverse and descending colon. Small amount of liquid stool in the sigmoid colon with equivocal wall thickening, series 2, image 56. Vascular/Lymphatic: Dense aortic atherosclerosis. No  aortic aneurysm. No acute aortic findings. Patent portal and splenic veins. No portal venous gas. No enlarged lymph nodes in the abdomen or pelvis, right pelvis assessment partially obscured by streak artifact. Reproductive: Possible atrophic uterus, pelvis partially obscured by streak artifact. There  is no left adnexal mass. Other: No free air or free fluid. Tiny fat containing umbilical hernia. 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 Right hip arthroplasty. Diminished sclerosis involving the right pubic body from prior exam. No acute osseous abnormalities are seen IMPRESSION: 1. Patient has listed history of appendectomy. There is a 2.3 cm tubular structure arising from the cecum measuring 9 mm in thickness at is fluid-filled with mild adjacent fat stranding. Differential considerations include cecal diverticulitis or stump appendicitis. 2. Small amount of liquid stool in the sigmoid colon with equivocal wall thickening, can be seen with diarrheal illness. 3. Hepatic steatosis. Stable flash filling hemangioma or AP shunt in the right lobe of the liver. 4. Layering stones or sludge in the gallbladder with equivocal gallbladder wall thickening. If there is clinical concern for acute cholecystitis, recommend right upper quadrant ultrasound for further evaluation. 5. Stable pulmonary nodules at the right lung base. Aortic Atherosclerosis (ICD10-I70.0). Electronically Signed   By: Keith Rake M.D.   On: 01/10/2021 20:09   MR 3D Recon At Scanner  Result Date: 01/14/2021 CLINICAL DATA:  Cholelithiasis, elevated LFTs EXAM: MRI ABDOMEN WITHOUT AND WITH CONTRAST (INCLUDING MRCP) TECHNIQUE: Multiplanar multisequence MR imaging of the abdomen was performed both before and after the administration of intravenous contrast. Heavily T2-weighted images of the biliary and pancreatic ducts were obtained, and three-dimensional MRCP images were rendered by post processing. CONTRAST:  42mL GADAVIST GADOBUTROL 1  MMOL/ML IV SOLN COMPARISON:  Right upper quadrant ultrasound dated 01/11/2021. CT abdomen/pelvis dated 01/10/2021. FINDINGS: Lower chest: Trace bilateral pleural effusions. Hepatobiliary: Liver is within normal limits. No suspicious/enhancing hepatic lesions. No hepatic steatosis. Layering small gallstones (series 3/image 25), without associated inflammatory changes. No intrahepatic or extrahepatic ductal dilatation. Common duct measures 5 mm. No choledocholithiasis is seen. Pancreas:  Within normal limits. Spleen:  Within normal limits. Adrenals/Urinary Tract:  Adrenal glands are within normal limits. Kidneys are within normal limits.  No hydronephrosis. Stomach/Bowel: Stomach is within normal limits. Mild wall thickening involving the ascending colon, better visualized prior CT, nonspecific and possibly reactive. Vascular/Lymphatic: No evidence of aneurysm. Mild fusiform ectasia of the infrarenal abdominal aorta (series 17/image 46). No suspicious abdominal lymphadenopathy. Other:  No abdominal ascites. Musculoskeletal: No focal osseous lesions. IMPRESSION: Cholelithiasis, without associated inflammatory changes to suggest acute cholecystitis. No intrahepatic or extrahepatic ductal dilatation. Common duct measures 5 mm. No choledocholithiasis is seen. Electronically Signed   By: Julian Hy M.D.   On: 01/14/2021 07:27   MR ABDOMEN MRCP W WO CONTAST  Result Date: 01/14/2021 CLINICAL DATA:  Cholelithiasis, elevated LFTs EXAM: MRI ABDOMEN WITHOUT AND WITH CONTRAST (INCLUDING MRCP) TECHNIQUE: Multiplanar multisequence MR imaging of the abdomen was performed both before and after the administration of intravenous contrast. Heavily T2-weighted images of the biliary and pancreatic ducts were obtained, and three-dimensional MRCP images were rendered by post processing. CONTRAST:  19mL GADAVIST GADOBUTROL 1 MMOL/ML IV SOLN COMPARISON:  Right upper quadrant ultrasound dated 01/11/2021. CT abdomen/pelvis dated  01/10/2021. FINDINGS: Lower chest: Trace bilateral pleural effusions. Hepatobiliary: Liver is within normal limits. No suspicious/enhancing hepatic lesions. No hepatic steatosis. Layering small gallstones (series 3/image 25), without associated inflammatory changes. No intrahepatic or extrahepatic ductal dilatation. Common duct measures 5 mm. No choledocholithiasis is seen. Pancreas:  Within normal limits. Spleen:  Within normal limits. Adrenals/Urinary Tract:  Adrenal glands are within normal limits. Kidneys are within normal limits.  No hydronephrosis. Stomach/Bowel: Stomach is within normal limits. Mild wall thickening involving the ascending colon, better visualized prior CT,  nonspecific and possibly reactive. Vascular/Lymphatic: No evidence of aneurysm. Mild fusiform ectasia of the infrarenal abdominal aorta (series 17/image 46). No suspicious abdominal lymphadenopathy. Other:  No abdominal ascites. Musculoskeletal: No focal osseous lesions. IMPRESSION: Cholelithiasis, without associated inflammatory changes to suggest acute cholecystitis. No intrahepatic or extrahepatic ductal dilatation. Common duct measures 5 mm. No choledocholithiasis is seen. Electronically Signed   By: Julian Hy M.D.   On: 01/14/2021 07:27   MM 3D SCREEN BREAST BILATERAL  Result Date: 01/01/2021 CLINICAL DATA:  Screening. 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 Bilateral screening digital breast tomosynthesis was performed. The images were evaluated with computer-aided detection. COMPARISON:  Previous exam(s). ACR Breast Density Category c: The breast tissue is heterogeneously dense, which may obscure small masses. FINDINGS: In the right breast, a possible asymmetry best seen on CC view warrants further evaluation. In the left breast, no findings  suspicious for malignancy. IMPRESSION: Further evaluation is suggested for possible asymmetry in the right breast. RECOMMENDATION: Diagnostic mammogram and possibly ultrasound of the right breast. (Code:[id number]) The patient will be contacted regarding the findings, and additional imaging will be scheduled. BI-RADS CATEGORY  0: Incomplete. Need additional imaging evaluation and/or prior mammograms for comparison. Electronically Signed   By: Valentino Saxon MD   On: 01/01/2021 16:08  US ABDOMEN LIMITED RUQ (LIVER/GB)  Result Date: 01/11/2021 CLINICAL DATA:  Acute transaminitis EXAM: ULTRASOUND ABDOMEN LIMITED RIGHT UPPER QUADRANT COMPARISON:  None. FINDINGS: Gallbladder: Mobile stones and sludge in the gallbladder. The gallbladder wall measures 4 mm. No Murphy's sign or pericholecystic fluid. Common bile duct: Diameter: 3 mm Liver: Increased echogenicity with no focal mass. Portal vein is patent on color Doppler imaging with normal direction of blood flow towards the liver. Other: None. IMPRESSION: 1. Mobile stones and sludge in the gallbladder with mild wall thickening but no Murphy's sign or pericholecystic fluid. If the clinical picture remains ambiguous, a HIDA scan could further evaluate. 2. Increased echogenicity in the liver is nonspecific but often due to hepatic steatosis. Electronically Signed   By: Dorise Bullion III M.D   On: 01/11/2021 10:58    Microbiology: Recent Results (from the past 240 hour(s))  Resp Panel by RT-PCR (Flu A&B, Covid) Nasopharyngeal Swab     Status: None   Collection Time: 01/10/21  8:00 PM   Specimen: Nasopharyngeal Swab; Nasopharyngeal(NP) swabs in vial transport medium  Result Value Ref Range Status   SARS Coronavirus 2 by RT PCR NEGATIVE NEGATIVE Final    Comment: (NOTE) SARS-CoV-2 target nucleic acids are NOT DETECTED.  The SARS-CoV-2 RNA is generally detectable in upper respiratory specimens during the acute phase of infection. The lowest concentration of  SARS-CoV-2 viral copies this assay can detect is 138 copies/mL. A negative result does not preclude SARS-Cov-2 infection and should not be used as the sole basis for treatment or other patient management decisions. A negative result may occur with  improper specimen collection/handling, submission of specimen other than nasopharyngeal swab, presence of viral mutation(s) within the areas targeted by this assay, and inadequate number of viral copies(<138 copies/mL). A negative result must be combined with clinical observations, patient history, and epidemiological information. The expected result is Negative.  Fact Sheet for Patients:  EntrepreneurPulse.com.au  Fact Sheet for Healthcare Providers:  IncredibleEmployment.be  This test is no t yet approved or cleared by the [place] FDA and  has been  authorized for detection and/or diagnosis of SARS-CoV-2 by FDA under an Emergency Use Authorization (EUA). This EUA will remain  in effect (meaning this test can be used) for the duration of the COVID-19 declaration under Section 564(b)(1) of the Act, 21 U.S.C.section 360bbb-3(b)(1), unless the authorization is terminated  or revoked sooner.       Influenza A by PCR NEGATIVE NEGATIVE Final   Influenza B by PCR NEGATIVE NEGATIVE Final    Comment: (NOTE) The Xpert Xpress SARS-CoV-2/FLU/RSV plus assay is intended as an aid in the diagnosis of influenza from Nasopharyngeal swab specimens and should not be used as a sole basis for treatment. Nasal washings and aspirates are unacceptable for Xpert Xpress SARS-CoV-2/FLU/RSV testing.  Fact Sheet for Patients: EntrepreneurPulse.com.au  Fact Sheet for Healthcare Providers: IncredibleEmployment.be  This test is not yet approved or cleared by the [place] FDA and has been authorized for detection and/or diagnosis of SARS-CoV-2 by FDA under an Emergency Use  Authorization (EUA). This EUA will remain in effect (meaning this test can be used) for the duration of the COVID-19 declaration under Section 564(b)(1) of the Act, 21 U.S.C. section 360bbb-3(b)(1), unless the authorization is terminated or revoked.  Performed at [facility], [address] [place], [place] [address]   Gastrointestinal Panel by PCR , Stool     Status: None   Collection Time: 01/11/21  1:58 PM   Specimen: Stool  Result Value Ref Range Status   Campylobacter species NOT DETECTED NOT DETECTED Final   Plesimonas shigelloides NOT DETECTED NOT DETECTED Final   Salmonella species NOT DETECTED NOT DETECTED Final   Yersinia enterocolitica NOT DETECTED NOT DETECTED Final   Vibrio species NOT DETECTED NOT DETECTED Final   Vibrio cholerae NOT DETECTED NOT DETECTED Final   Enteroaggregative E coli (EAEC) NOT DETECTED NOT DETECTED Final   Enteropathogenic E coli (EPEC) NOT DETECTED NOT DETECTED Final   Enterotoxigenic E coli (ETEC) NOT DETECTED NOT DETECTED Final   Shiga like toxin producing E coli (STEC) NOT DETECTED NOT DETECTED Final   Shigella/Enteroinvasive E coli (EIEC) NOT DETECTED NOT DETECTED Final   Cryptosporidium NOT DETECTED NOT DETECTED Final   Cyclospora cayetanensis NOT DETECTED NOT DETECTED Final   Entamoeba histolytica NOT DETECTED NOT DETECTED Final   Giardia lamblia NOT DETECTED NOT DETECTED Final   Adenovirus F40/41 NOT DETECTED NOT DETECTED Final   Astrovirus NOT DETECTED NOT DETECTED Final   Norovirus GI/GII NOT DETECTED NOT DETECTED Final   Rotavirus A NOT DETECTED NOT DETECTED Final   Sapovirus (I, II, IV, and V) NOT DETECTED NOT DETECTED Final    Comment: Performed at [facility], [address] [place], [place] [address]  C Difficile Quick Screen w PCR reflex     Status: None   Collection Time: 01/11/21  1:58 PM   Specimen: STOOL  Result Value Ref Range Status   C Diff antigen NEGATIVE NEGATIVE Final   C Diff toxin NEGATIVE  NEGATIVE Final   C Diff interpretation No C. difficile detected.  Final    Comment: Performed at [facility], [address] [place], [place] [address]     Labs: Basic Metabolic Panel: Recent Labs  Lab 01/10/21 1653 01/11/21 0627 01/12/21 0605 01/13/21 0603 01/14/21 0451  NA 136 136 137 132* 137  K 3.4* 3.8 3.5 3.6 4.2  CL 100 104 107 101 107  CO2 29 26 24 26 26   GLUCOSE 99 84 104* 83 87  BUN 10 10 6* 5* 6*  CREATININE 1.03* 1.02* 0.89 0.87 0.89  CALCIUM 8.4* 8.4* 8.2* 8.2* 8.5*  MG 2.1 1.9 1.9 1.9 2.0  PHOS  --  3.1  --   --   --     Liver Function Tests: Recent Labs  Lab 01/10/21 1653 01/11/21 0627 01/12/21 0605 01/13/21 0603 01/14/21 0451  AST 150* 117* 132* 170* 150*  ALT 88* 74* 73* 85* 75*  ALKPHOS 226* 204* 197* 231* 220*  BILITOT 2.5* 2.2* 1.9* 2.4* 1.8*  PROT 5.9* 5.3* 4.9* 5.4* 4.9*  ALBUMIN 2.8* 2.6* 2.4* 2.7* 2.5*    Recent Labs  Lab 01/10/21 1653  LIPASE 26    No results for input(s): AMMONIA in the last 168 hours. CBC: Recent Labs  Lab 01/10/21 1653 01/11/21 0627 01/12/21 0605 01/13/21 0603 01/14/21 0451  WBC 3.6* 3.2* 2.7* 3.7* 2.7*  NEUTROABS 2.3 2.0 1.7 2.5 1.7  HGB 16.5* 14.3 14.2 14.7 13.2  HCT 48.5* 41.4 41.1 43.0 37.9  MCV 93.3 91.0 90.3 91.7 89.4  PLT 110* 89* 77* 86* 80*    Cardiac Enzymes: No results for input(s): CKTOTAL, CKMB, CKMBINDEX, TROPONINI in the last 168 hours. BNP: BNP (last 3 results) Recent Labs    06/02/20 1616 06/18/20 1604 07/03/20 1812  BNP 616.8* 154.5* 214.4*     ProBNP (last 3 results) No results for input(s): PROBNP in the last 8760 hours.  CBG: No results for input(s): GLUCAP in the last 168 hours.     Signed:  Irine Seal MD.  [facility] 01/14/2021, 2:25 PM

## 2021-01-14 NOTE — Care Management (Signed)
BSC delivered to room by [facility]

## 2021-01-15 ENCOUNTER — Ambulatory Visit: Payer: Self-pay

## 2021-01-15 DIAGNOSIS — J449 Chronic obstructive pulmonary disease, unspecified: Secondary | ICD-10-CM | POA: Diagnosis not present

## 2021-01-15 DIAGNOSIS — I10 Essential (primary) hypertension: Secondary | ICD-10-CM | POA: Diagnosis not present

## 2021-01-15 DIAGNOSIS — R413 Other amnesia: Secondary | ICD-10-CM

## 2021-01-15 LAB — MITOCHONDRIAL ANTIBODIES: Mitochondrial M2 Ab, IgG: <20 Units (ref 0.0–20.0)

## 2021-01-15 LAB — ANTI-SMOOTH MUSCLE ANTIBODY, IGG: F-Actin IgG: 19 Units (ref 0–19)

## 2021-01-15 LAB — ANTI-MICROSOMAL ANTIBODY LIVER / KIDNEY: LKM1 Ab: 1.1 Units (ref 0.0–20.0)

## 2021-01-15 NOTE — Telephone Encounter (Signed)
Patient was seen at the ER for this.

## 2021-01-16 ENCOUNTER — Ambulatory Visit: Payer: Self-pay | Admitting: *Deleted

## 2021-01-16 NOTE — Telephone Encounter (Signed)
Pt is calling because she has wart/bump on her nose that is painful. Pt was scheduled for on 02/03/21. Is there anything that she can do in the meantime for the bump? Please advise  Called patient to review symptoms. No answer, left voicemail to call clinic back.

## 2021-01-16 NOTE — Telephone Encounter (Signed)
Called patient 2nd attempt. No answer, voicemail left to call clinic back

## 2021-01-16 NOTE — Telephone Encounter (Signed)
3 rd attempt to call patient . No answer, left voicemail to call clinic back.

## 2021-01-17 LAB — IGG 1, 2, 3, AND 4
IgG (Immunoglobin G), Serum: 1029 mg/dL (ref 586–1602)
IgG, Subclass 1: 580 mg/dL (ref 248–810)
IgG, Subclass 2: 272 mg/dL (ref 130–555)
IgG, Subclass 3: 153 mg/dL — ABNORMAL HIGH (ref 15–102)
IgG, Subclass 4: 13 mg/dL (ref 2–96)

## 2021-01-17 NOTE — Telephone Encounter (Signed)
If it seems like a pimple, could use hydrocolloid patch or benzaclin OTC. Otherwise, can eval at next visit

## 2021-01-21 NOTE — Telephone Encounter (Signed)
Patient advised.

## 2021-01-22 DIAGNOSIS — H16032 Corneal ulcer with hypopyon, left eye: Secondary | ICD-10-CM | POA: Diagnosis not present

## 2021-01-22 DIAGNOSIS — H16143 Punctate keratitis, bilateral: Secondary | ICD-10-CM | POA: Diagnosis not present

## 2021-01-22 DIAGNOSIS — H2513 Age-related nuclear cataract, bilateral: Secondary | ICD-10-CM | POA: Diagnosis not present

## 2021-01-23 ENCOUNTER — Telehealth: Payer: Self-pay

## 2021-01-23 NOTE — Telephone Encounter (Signed)
OK for verbals 

## 2021-01-23 NOTE — Telephone Encounter (Signed)
Copied from [place] [id number]. Topic: Quick Communication - Home Health Verbal Orders >> Jan 23, 2021  9:29 AM Tessa Lerner A wrote: Caller/Agency: Leory Plowman  Callback Number: [phone]  Requesting OT/PT/Skilled Nursing/Social Work/Speech Therapy: OT  Frequency: Resumption of care orders

## 2021-01-23 NOTE — Telephone Encounter (Signed)
Left message with verbals.   Thanks,   -[name]

## 2021-01-24 ENCOUNTER — Telehealth: Payer: Self-pay

## 2021-01-24 ENCOUNTER — Ambulatory Visit: Payer: Self-pay

## 2021-01-24 ENCOUNTER — Telehealth: Payer: Self-pay | Admitting: Family Medicine

## 2021-01-24 DIAGNOSIS — J9611 Chronic respiratory failure with hypoxia: Secondary | ICD-10-CM | POA: Diagnosis not present

## 2021-01-24 DIAGNOSIS — M810 Age-related osteoporosis without current pathological fracture: Secondary | ICD-10-CM | POA: Diagnosis not present

## 2021-01-24 DIAGNOSIS — E440 Moderate protein-calorie malnutrition: Secondary | ICD-10-CM | POA: Diagnosis not present

## 2021-01-24 DIAGNOSIS — I130 Hypertensive heart and chronic kidney disease with heart failure and stage 1 through stage 4 chronic kidney disease, or unspecified chronic kidney disease: Secondary | ICD-10-CM | POA: Diagnosis not present

## 2021-01-24 DIAGNOSIS — I739 Peripheral vascular disease, unspecified: Secondary | ICD-10-CM | POA: Diagnosis not present

## 2021-01-24 DIAGNOSIS — I951 Orthostatic hypotension: Secondary | ICD-10-CM | POA: Diagnosis not present

## 2021-01-24 DIAGNOSIS — I5042 Chronic combined systolic (congestive) and diastolic (congestive) heart failure: Secondary | ICD-10-CM | POA: Diagnosis not present

## 2021-01-24 DIAGNOSIS — N183 Chronic kidney disease, stage 3 unspecified: Secondary | ICD-10-CM | POA: Diagnosis not present

## 2021-01-24 DIAGNOSIS — M1610 Unilateral primary osteoarthritis, unspecified hip: Secondary | ICD-10-CM | POA: Diagnosis not present

## 2021-01-24 DIAGNOSIS — J441 Chronic obstructive pulmonary disease with (acute) exacerbation: Secondary | ICD-10-CM | POA: Diagnosis not present

## 2021-01-24 NOTE — Chronic Care Management (AMB) (Signed)
  Chronic Care Management   Follow Up Note    Name: SHERLYNE CROWNOVER MRN: [id number] DOB: Feb 07, 1946  Primary Care Provider: [name], MD Reason for referral : Chronic Care Management   ILA LANDOWSKI is a 74 y.o. year old female who is a primary care patient of Bacigalupo, Dionne Bucy, MD. The CCM team was consulted for assistance with chronic disease management and care coordination.  Able to speak with her caregiver Phelan today. During the last outreach attempt Mrs. Marinw was in distress and required emergency evaluation and hospitalization. She was recently discharged. Ahtziry was attempting to assist her at the time of the call. Reviewed required follow-ups and confirmed start of home health services with [facility]. Ahmyah anticipates being available to complete care management outreach next week. Agreed to call or contact the clinic staff with urgent concerns if needed.   PLAN Will follow up next week.    [name] Care Management [facility] ([facility] Care Management [phone]     SIGNATURE

## 2021-01-24 NOTE — Telephone Encounter (Signed)
Just and FYI message regarding a POSSIBLE interaction with Cipro and Aricept. Patient not exhibiting any side effects.

## 2021-01-24 NOTE — Telephone Encounter (Signed)
Mariah [name] from [facility] called and she says that the patient was discharged and the medications Ciprofloxacin and Donapezil is showing an alert on her tablet for possible drug interaction level 2. She says the patient is not having any adverse reactions. She is calling to notify the provider of the above. She says if there will be changes to the medications to call the patient's niece to let her know, Mariah [name] [phone] per DPR.   Message from [place] sent at 01/24/2021  5:22 PM EDT  Addl notes: Also the drug reactions are at a level 2 on both.   ----- Message from Bayard Beaver sent at 01/24/2021  5:11 PM EDT -----  stephanie from [facility] called in, pt has drug reactions to ciprofloxacin 50mg  donepezio 10 mg. Please call [phone] back     Reason for Disposition  [1] Caller requesting NON-URGENT health information AND [2] PCP's office is the best resource  Answer Assessment - Initial Assessment Questions 1. REASON FOR CALL or QUESTION: "What is your reason for calling today?" or "How can I best help you?" or "What question do you have that I can help answer?"     Just want to let the provider know of possible drug interatction  Protocols used: Information Only Call - No Triage-A-AH

## 2021-01-24 NOTE — Telephone Encounter (Signed)
Mariah [name] with [facility] called and given the verbal order ok per Dr. Brita Romp on 01/23/21 below, she verbalized understanding.

## 2021-01-24 NOTE — Telephone Encounter (Signed)
Copied from [place] [id number]. Topic: General - Other >> Jan 24, 2021  5:03 PM Bayard Beaver wrote: Reason for SHU:[id number] from [facility] called in, about patient getting a health exemption of care and also had drug reactions to ciprofloxacin 50mg  donepezio 10 mg. Please call [phone] back

## 2021-01-24 NOTE — Telephone Encounter (Signed)
Home Health Verbal Orders - Caller/Agency:[facility] Requesting OT and social work consult Frequency:1x1 2x3

## 2021-01-28 ENCOUNTER — Other Ambulatory Visit: Payer: Self-pay | Admitting: Neurology

## 2021-01-28 DIAGNOSIS — M79605 Pain in left leg: Secondary | ICD-10-CM

## 2021-01-28 DIAGNOSIS — Z86718 Personal history of other venous thrombosis and embolism: Secondary | ICD-10-CM

## 2021-01-28 DIAGNOSIS — D385 Neoplasm of uncertain behavior of other respiratory organs: Secondary | ICD-10-CM | POA: Diagnosis not present

## 2021-01-28 NOTE — Telephone Encounter (Signed)
Ok to take those medications together.

## 2021-01-29 ENCOUNTER — Ambulatory Visit: Admission: RE | Admit: 2021-01-29 | Payer: Medicare HMO | Source: Ambulatory Visit

## 2021-01-30 DIAGNOSIS — E876 Hypokalemia: Secondary | ICD-10-CM | POA: Diagnosis not present

## 2021-01-30 DIAGNOSIS — I130 Hypertensive heart and chronic kidney disease with heart failure and stage 1 through stage 4 chronic kidney disease, or unspecified chronic kidney disease: Secondary | ICD-10-CM | POA: Diagnosis not present

## 2021-01-30 DIAGNOSIS — J9611 Chronic respiratory failure with hypoxia: Secondary | ICD-10-CM | POA: Diagnosis not present

## 2021-01-30 DIAGNOSIS — E440 Moderate protein-calorie malnutrition: Secondary | ICD-10-CM | POA: Diagnosis not present

## 2021-01-30 DIAGNOSIS — I951 Orthostatic hypotension: Secondary | ICD-10-CM | POA: Diagnosis not present

## 2021-01-30 DIAGNOSIS — M1610 Unilateral primary osteoarthritis, unspecified hip: Secondary | ICD-10-CM | POA: Diagnosis not present

## 2021-01-30 DIAGNOSIS — A09 Infectious gastroenteritis and colitis, unspecified: Secondary | ICD-10-CM | POA: Diagnosis not present

## 2021-01-30 DIAGNOSIS — J441 Chronic obstructive pulmonary disease with (acute) exacerbation: Secondary | ICD-10-CM | POA: Diagnosis not present

## 2021-01-30 DIAGNOSIS — I5042 Chronic combined systolic (congestive) and diastolic (congestive) heart failure: Secondary | ICD-10-CM | POA: Diagnosis not present

## 2021-01-30 DIAGNOSIS — N183 Chronic kidney disease, stage 3 unspecified: Secondary | ICD-10-CM | POA: Diagnosis not present

## 2021-01-31 DIAGNOSIS — I130 Hypertensive heart and chronic kidney disease with heart failure and stage 1 through stage 4 chronic kidney disease, or unspecified chronic kidney disease: Secondary | ICD-10-CM | POA: Diagnosis not present

## 2021-01-31 DIAGNOSIS — N183 Chronic kidney disease, stage 3 unspecified: Secondary | ICD-10-CM | POA: Diagnosis not present

## 2021-01-31 DIAGNOSIS — E440 Moderate protein-calorie malnutrition: Secondary | ICD-10-CM | POA: Diagnosis not present

## 2021-01-31 DIAGNOSIS — I5042 Chronic combined systolic (congestive) and diastolic (congestive) heart failure: Secondary | ICD-10-CM | POA: Diagnosis not present

## 2021-01-31 DIAGNOSIS — A09 Infectious gastroenteritis and colitis, unspecified: Secondary | ICD-10-CM | POA: Diagnosis not present

## 2021-01-31 DIAGNOSIS — I951 Orthostatic hypotension: Secondary | ICD-10-CM | POA: Diagnosis not present

## 2021-01-31 DIAGNOSIS — M1610 Unilateral primary osteoarthritis, unspecified hip: Secondary | ICD-10-CM | POA: Diagnosis not present

## 2021-01-31 DIAGNOSIS — J9611 Chronic respiratory failure with hypoxia: Secondary | ICD-10-CM | POA: Diagnosis not present

## 2021-01-31 DIAGNOSIS — J441 Chronic obstructive pulmonary disease with (acute) exacerbation: Secondary | ICD-10-CM | POA: Diagnosis not present

## 2021-01-31 DIAGNOSIS — E876 Hypokalemia: Secondary | ICD-10-CM | POA: Diagnosis not present

## 2021-02-03 ENCOUNTER — Ambulatory Visit: Payer: PPO | Admitting: Family Medicine

## 2021-02-03 DIAGNOSIS — M1610 Unilateral primary osteoarthritis, unspecified hip: Secondary | ICD-10-CM | POA: Diagnosis not present

## 2021-02-03 DIAGNOSIS — I5042 Chronic combined systolic (congestive) and diastolic (congestive) heart failure: Secondary | ICD-10-CM | POA: Diagnosis not present

## 2021-02-03 DIAGNOSIS — N183 Chronic kidney disease, stage 3 unspecified: Secondary | ICD-10-CM | POA: Diagnosis not present

## 2021-02-03 DIAGNOSIS — A09 Infectious gastroenteritis and colitis, unspecified: Secondary | ICD-10-CM | POA: Diagnosis not present

## 2021-02-03 DIAGNOSIS — E440 Moderate protein-calorie malnutrition: Secondary | ICD-10-CM | POA: Diagnosis not present

## 2021-02-03 DIAGNOSIS — I951 Orthostatic hypotension: Secondary | ICD-10-CM | POA: Diagnosis not present

## 2021-02-03 DIAGNOSIS — J441 Chronic obstructive pulmonary disease with (acute) exacerbation: Secondary | ICD-10-CM | POA: Diagnosis not present

## 2021-02-03 DIAGNOSIS — I130 Hypertensive heart and chronic kidney disease with heart failure and stage 1 through stage 4 chronic kidney disease, or unspecified chronic kidney disease: Secondary | ICD-10-CM | POA: Diagnosis not present

## 2021-02-03 DIAGNOSIS — E876 Hypokalemia: Secondary | ICD-10-CM | POA: Diagnosis not present

## 2021-02-03 DIAGNOSIS — J9611 Chronic respiratory failure with hypoxia: Secondary | ICD-10-CM | POA: Diagnosis not present

## 2021-02-04 DIAGNOSIS — Z96641 Presence of right artificial hip joint: Secondary | ICD-10-CM | POA: Diagnosis not present

## 2021-02-04 DIAGNOSIS — M25552 Pain in left hip: Secondary | ICD-10-CM | POA: Diagnosis not present

## 2021-02-04 DIAGNOSIS — I714 Abdominal aortic aneurysm, without rupture: Secondary | ICD-10-CM | POA: Diagnosis not present

## 2021-02-05 ENCOUNTER — Inpatient Hospital Stay: Payer: PPO | Admitting: Family Medicine

## 2021-02-05 ENCOUNTER — Telehealth: Payer: Self-pay

## 2021-02-05 DIAGNOSIS — K7402 Hepatic fibrosis, advanced fibrosis: Secondary | ICD-10-CM | POA: Diagnosis not present

## 2021-02-05 DIAGNOSIS — R197 Diarrhea, unspecified: Secondary | ICD-10-CM | POA: Diagnosis not present

## 2021-02-05 NOTE — Telephone Encounter (Signed)
Copied from [place] [id number]. Topic: General - Other >> Feb 05, 2021  1:29 PM Tessa Lerner A wrote: Reason for CRM: Patient's niece would like to be contacted by a member of clinical staff regarding patients previous bloodwork with Dr. Haig Prophet   Patient's niece has concerns with previously requested orders by the patient's GI specialist  as well   Please contact further when possible

## 2021-02-05 NOTE — Telephone Encounter (Signed)
Patient's niece has made an additional call about this matter  Please contact further when possible

## 2021-02-06 ENCOUNTER — Telehealth: Payer: Self-pay

## 2021-02-06 ENCOUNTER — Inpatient Hospital Stay: Payer: Medicare HMO | Admitting: Family Medicine

## 2021-02-06 DIAGNOSIS — R413 Other amnesia: Secondary | ICD-10-CM

## 2021-02-06 DIAGNOSIS — I739 Peripheral vascular disease, unspecified: Secondary | ICD-10-CM

## 2021-02-06 DIAGNOSIS — I10 Essential (primary) hypertension: Secondary | ICD-10-CM

## 2021-02-06 DIAGNOSIS — J449 Chronic obstructive pulmonary disease, unspecified: Secondary | ICD-10-CM

## 2021-02-06 NOTE — Telephone Encounter (Signed)
Called back and spoke with Mrs. Paulette she states that patient had saw Dr. Haig Prophet yesterday at [facility] and when she went to lab to have labs drawn she states that technician stuck patient 3x but was unsuccessful getting specimen. Mrs. Paulette stated that she let Dr. Haig Prophet know and it was suggested to her to speak with PCP about getting labs drawn here, patient has a scheduled appt with you tomorrow morning at 10:20AM. Amparo [place]

## 2021-02-06 NOTE — Telephone Encounter (Signed)
Okay to place order for CCM? KW

## 2021-02-06 NOTE — Telephone Encounter (Signed)
It is typically up to ordering MD's office to discuss results of labs with patients. Can we see what her concerns are?

## 2021-02-06 NOTE — Telephone Encounter (Signed)
Copied from [place] [id number]. Topic: General - Other >> Feb 05, 2021  4:51 PM Tessa Lerner A wrote: Reason for CRM: Patient's niece would like to speak with a Education officer, museum regarding the patient's overall care and wellness  The patient's demeanor has changed and the patient's niece is concerned   Please contact further when possible

## 2021-02-07 ENCOUNTER — Inpatient Hospital Stay: Payer: Medicare HMO | Admitting: Family Medicine

## 2021-02-07 ENCOUNTER — Telehealth: Payer: Self-pay | Admitting: *Deleted

## 2021-02-07 DIAGNOSIS — M1610 Unilateral primary osteoarthritis, unspecified hip: Secondary | ICD-10-CM | POA: Diagnosis not present

## 2021-02-07 DIAGNOSIS — I130 Hypertensive heart and chronic kidney disease with heart failure and stage 1 through stage 4 chronic kidney disease, or unspecified chronic kidney disease: Secondary | ICD-10-CM | POA: Diagnosis not present

## 2021-02-07 DIAGNOSIS — N183 Chronic kidney disease, stage 3 unspecified: Secondary | ICD-10-CM | POA: Diagnosis not present

## 2021-02-07 DIAGNOSIS — J441 Chronic obstructive pulmonary disease with (acute) exacerbation: Secondary | ICD-10-CM | POA: Diagnosis not present

## 2021-02-07 DIAGNOSIS — J9611 Chronic respiratory failure with hypoxia: Secondary | ICD-10-CM | POA: Diagnosis not present

## 2021-02-07 DIAGNOSIS — E876 Hypokalemia: Secondary | ICD-10-CM | POA: Diagnosis not present

## 2021-02-07 DIAGNOSIS — E440 Moderate protein-calorie malnutrition: Secondary | ICD-10-CM | POA: Diagnosis not present

## 2021-02-07 DIAGNOSIS — I951 Orthostatic hypotension: Secondary | ICD-10-CM | POA: Diagnosis not present

## 2021-02-07 DIAGNOSIS — A09 Infectious gastroenteritis and colitis, unspecified: Secondary | ICD-10-CM | POA: Diagnosis not present

## 2021-02-07 DIAGNOSIS — I5042 Chronic combined systolic (congestive) and diastolic (congestive) heart failure: Secondary | ICD-10-CM | POA: Diagnosis not present

## 2021-02-07 NOTE — Chronic Care Management (AMB) (Signed)
  Chronic Care Management   Note  02/07/2021 Name: DANNA SEWELL MRN: [id number] DOB: 05/31/1946  FRADEL BALDONADO is a 75 y.o. year old female who is a primary care patient of Brita Romp, Dionne Bucy, MD. I reached out to Jackolyn Confer by phone today in response to a referral sent by Ms. Audry Riles Glick's PCP Brita Romp Dionne Bucy, MD     Ms. Tomlin was given information about Chronic Care Management services today including:  CCM service includes personalized support from designated clinical staff supervised by her physician, including individualized plan of care and coordination with other care providers 24/7 contact phone numbers for assistance for urgent and routine care needs. Service will only be billed when office clinical staff spend 20 minutes or more in a month to coordinate care. Only one practitioner may furnish and bill the service in a calendar month. The patient may stop CCM services at any time (effective at the end of the month) by phone call to the office staff. The patient will be responsible for cost sharing (co-pay) of up to 20% of the service fee (after annual deductible is met).  Patient agreed to services and verbal consent obtained.   Follow up plan: Telephone appointment with care management team member scheduled for: 02/20/2021  Julian Hy, [place], [place] [facility]  Direct Dial: [phone]

## 2021-02-07 NOTE — Addendum Note (Signed)
Addended by: Wilburt Finlay on: 02/07/2021 09:03 AM   Modules accepted: Orders

## 2021-02-07 NOTE — Telephone Encounter (Signed)
Order for CCM placed.

## 2021-02-10 DIAGNOSIS — H2513 Age-related nuclear cataract, bilateral: Secondary | ICD-10-CM | POA: Diagnosis not present

## 2021-02-11 ENCOUNTER — Encounter: Payer: Self-pay | Admitting: Family Medicine

## 2021-02-11 ENCOUNTER — Other Ambulatory Visit: Payer: Self-pay | Admitting: Unknown Physician Specialty

## 2021-02-11 ENCOUNTER — Telehealth: Payer: Self-pay

## 2021-02-11 DIAGNOSIS — B078 Other viral warts: Secondary | ICD-10-CM | POA: Diagnosis not present

## 2021-02-11 NOTE — Telephone Encounter (Signed)
Can see if there is anything labelled ok for 20 or ok to double book that we could find a 20 min appt to use this week or next. Also consider seeing if other providers have any availability

## 2021-02-11 NOTE — Telephone Encounter (Signed)
Did you want to work the patient in? Please advise. Thanks!

## 2021-02-11 NOTE — Telephone Encounter (Signed)
Copied from [place] [id number]. Topic: Appointment Scheduling - Scheduling Inquiry for Clinic >> Feb 11, 2021 12:48 PM Celene Kras wrote: Reason for CRM: Pts niece, Wilhemena Durie, calling stating that the pt is in need of a Hospital follow up. She states that she is needing something asap. Please advise.

## 2021-02-12 DIAGNOSIS — J9611 Chronic respiratory failure with hypoxia: Secondary | ICD-10-CM | POA: Diagnosis not present

## 2021-02-12 DIAGNOSIS — A09 Infectious gastroenteritis and colitis, unspecified: Secondary | ICD-10-CM | POA: Diagnosis not present

## 2021-02-12 DIAGNOSIS — I951 Orthostatic hypotension: Secondary | ICD-10-CM | POA: Diagnosis not present

## 2021-02-12 DIAGNOSIS — E876 Hypokalemia: Secondary | ICD-10-CM | POA: Diagnosis not present

## 2021-02-12 DIAGNOSIS — J441 Chronic obstructive pulmonary disease with (acute) exacerbation: Secondary | ICD-10-CM | POA: Diagnosis not present

## 2021-02-12 DIAGNOSIS — I130 Hypertensive heart and chronic kidney disease with heart failure and stage 1 through stage 4 chronic kidney disease, or unspecified chronic kidney disease: Secondary | ICD-10-CM | POA: Diagnosis not present

## 2021-02-12 DIAGNOSIS — E440 Moderate protein-calorie malnutrition: Secondary | ICD-10-CM | POA: Diagnosis not present

## 2021-02-12 DIAGNOSIS — M1610 Unilateral primary osteoarthritis, unspecified hip: Secondary | ICD-10-CM | POA: Diagnosis not present

## 2021-02-12 DIAGNOSIS — I5042 Chronic combined systolic (congestive) and diastolic (congestive) heart failure: Secondary | ICD-10-CM | POA: Diagnosis not present

## 2021-02-12 DIAGNOSIS — N183 Chronic kidney disease, stage 3 unspecified: Secondary | ICD-10-CM | POA: Diagnosis not present

## 2021-02-13 ENCOUNTER — Ambulatory Visit (INDEPENDENT_AMBULATORY_CARE_PROVIDER_SITE_OTHER): Payer: Medicare HMO | Admitting: Family Medicine

## 2021-02-13 ENCOUNTER — Telehealth: Payer: Self-pay | Admitting: Student

## 2021-02-13 ENCOUNTER — Other Ambulatory Visit: Payer: Self-pay

## 2021-02-13 VITALS — BP 124/72 | HR 84 | Temp 97.6°F | Wt 124.0 lb

## 2021-02-13 DIAGNOSIS — K029 Dental caries, unspecified: Secondary | ICD-10-CM

## 2021-02-13 DIAGNOSIS — E785 Hyperlipidemia, unspecified: Secondary | ICD-10-CM

## 2021-02-13 DIAGNOSIS — D696 Thrombocytopenia, unspecified: Secondary | ICD-10-CM | POA: Diagnosis not present

## 2021-02-13 DIAGNOSIS — I130 Hypertensive heart and chronic kidney disease with heart failure and stage 1 through stage 4 chronic kidney disease, or unspecified chronic kidney disease: Secondary | ICD-10-CM | POA: Diagnosis not present

## 2021-02-13 DIAGNOSIS — R7401 Elevation of levels of liver transaminase levels: Secondary | ICD-10-CM

## 2021-02-13 DIAGNOSIS — N1832 Chronic kidney disease, stage 3b: Secondary | ICD-10-CM | POA: Diagnosis not present

## 2021-02-13 DIAGNOSIS — I951 Orthostatic hypotension: Secondary | ICD-10-CM | POA: Diagnosis not present

## 2021-02-13 DIAGNOSIS — D72819 Decreased white blood cell count, unspecified: Secondary | ICD-10-CM | POA: Diagnosis not present

## 2021-02-13 DIAGNOSIS — I5042 Chronic combined systolic (congestive) and diastolic (congestive) heart failure: Secondary | ICD-10-CM | POA: Diagnosis not present

## 2021-02-13 DIAGNOSIS — E43 Unspecified severe protein-calorie malnutrition: Secondary | ICD-10-CM | POA: Diagnosis not present

## 2021-02-13 DIAGNOSIS — M1610 Unilateral primary osteoarthritis, unspecified hip: Secondary | ICD-10-CM | POA: Diagnosis not present

## 2021-02-13 DIAGNOSIS — E876 Hypokalemia: Secondary | ICD-10-CM

## 2021-02-13 DIAGNOSIS — A09 Infectious gastroenteritis and colitis, unspecified: Secondary | ICD-10-CM | POA: Diagnosis not present

## 2021-02-13 DIAGNOSIS — R197 Diarrhea, unspecified: Secondary | ICD-10-CM

## 2021-02-13 DIAGNOSIS — J9611 Chronic respiratory failure with hypoxia: Secondary | ICD-10-CM | POA: Diagnosis not present

## 2021-02-13 DIAGNOSIS — K7402 Hepatic fibrosis, advanced fibrosis: Secondary | ICD-10-CM

## 2021-02-13 DIAGNOSIS — N183 Chronic kidney disease, stage 3 unspecified: Secondary | ICD-10-CM | POA: Diagnosis not present

## 2021-02-13 DIAGNOSIS — E440 Moderate protein-calorie malnutrition: Secondary | ICD-10-CM | POA: Diagnosis not present

## 2021-02-13 DIAGNOSIS — J441 Chronic obstructive pulmonary disease with (acute) exacerbation: Secondary | ICD-10-CM | POA: Diagnosis not present

## 2021-02-13 LAB — SURGICAL PATHOLOGY

## 2021-02-13 NOTE — Assessment & Plan Note (Addendum)
Stable, encouraged pt. to maintain proper nutrition/hydration; repeat CMP, CBC today; will continue to monitor

## 2021-02-13 NOTE — Assessment & Plan Note (Signed)
Repeat CMP today; will continue to monitor; encouraged pt. to maintain proper hydration in the setting of chronic diarrhea

## 2021-02-13 NOTE — Progress Notes (Signed)
Established patient visit   Patient: Mariah [name]   DOB: 06/06/1946   75 y.o. Female  MRN: [id number] Visit Date: 02/13/2021  Today's healthcare provider: Lavon Paganini, MD   Chief Complaint  Patient presents with   Hospitalization Follow-up    Subjective    HPI  Transaminitis - Pt. presents for hospital follow-up on transaminitis; d/c'd 01/14/21 - Pt. denies fevers, n/v, and abdominal pain; denies recent tick bites  - Followed by GI; due for repeat U/S on 02/26/21  Diarrhea - Pt. reports having "black" loose, watery stools 2-3x/day - Pt. states that using imodium and eating a soft diet w/ minimal relief - Pt. has only used 1 dose of Cholestyramine so far, which she feels has been ineffective - following with GI  Dental Caries -Pt. due to have 8 dental extractions, next dentist appt on 04/28/21   Medications: Outpatient Medications Prior to Visit  Medication Sig   albuterol (VENTOLIN HFA) 108 (90 Base) MCG/ACT inhaler INHALE 1-2 PUFFS INTO THE LUNGS EVERY 6 HOURS AS NEEDED FOR WHEEZING OR SHORTNESS OF BREATH.   betamethasone dipropionate (DIPROLENE) 0.05 % ointment Apply topically daily.   BREO ELLIPTA 100-25 MCG/INH AEPB TAKE 1 PUFF BY MOUTH EVERY DAY   calcipotriene (DOVONOX) 0.005 % cream APPLY TO SKIN ONCE A DAY APPLY DAILY ON HANDS, FEET AND WRIST X 2 WEEKS, THEN USE 5 DAYS A WEEK   Calcipotriene 0.005 % solution APPLY 1 A SMALL AMOUNT TO SCALP ONCE A DAY APPLY TO SCALP DAILY FOR 2 WEEKS, THEN USE ON SCALP 5 DAYS A WEEK   Cholecalciferol (VITAMIN D3) 2000 units TABS Take 2,000 Units by mouth daily.   clobetasol (TEMOVATE) 0.05 % external solution APPLY TO AFFECTED AREA TWICE A DAY   COSENTYX SENSOREADY, 300 MG, 150 MG/ML SOAJ Inject 2 Syringes into the skin every 28 (twenty-eight) days.   donepezil (ARICEPT) 10 MG tablet Take 10 mg by mouth daily.   ferrous sulfate 325 (65 FE) MG tablet Take 325 mg by mouth 2 (two) times daily.   folic acid (FOLVITE) 1 MG  tablet Take 1 mg by mouth daily.   gabapentin (NEURONTIN) 300 MG capsule Take 1 capsule (300 mg total) by mouth 2 (two) times daily.   levocetirizine (XYZAL) 5 MG tablet TAKE 1 TABLET BY MOUTH EVERY DAY IN THE EVENING   loperamide (IMODIUM) 2 MG capsule Take 1 capsule (2 mg total) by mouth as needed for diarrhea or loose stools.   metoprolol succinate (TOPROL-XL) 50 MG 24 hr tablet TAKE 1 TABLET BY MOUTH DAILY. TAKE WITH OR IMMEDIATELY FOLLOWING A MEAL.   pantoprazole (PROTONIX) 20 MG tablet TAKE 1 TABLET (20 MG TOTAL) BY MOUTH 2 (TWO) TIMES DAILY BEFORE A MEAL.   potassium chloride SA (KLOR-CON) 20 MEQ tablet Take 2 tablets (40 mEq total) by mouth daily.   rOPINIRole (REQUIP) 1 MG tablet TAKE 1 TABLET BY MOUTH AT BEDTIME.   traMADol (ULTRAM) 50 MG tablet TAKE 1 TABLET BY MOUTH EVERY 6 HOURS AS NEEDED FOR MODERATE OR SEVERE PAIN   furosemide (LASIX) 40 MG tablet Take 1 tablet (40 mg total) by mouth daily. If weight 127 lb or less, take 20 mg by mouth daily.   No facility-administered medications prior to visit.    Review of Systems  Constitutional:  Negative for appetite change, fatigue and fever.  HENT:  Positive for dental problem.   Eyes: Negative.   Respiratory: Negative.  Negative for chest tightness and shortness of  breath.   Cardiovascular: Negative.  Negative for chest pain and leg swelling.  Gastrointestinal:  Positive for diarrhea.  Endocrine: Negative.   Genitourinary: Negative.   Musculoskeletal: Negative.   Skin: Negative.   Neurological: Negative.   Hematological: Negative.  Does not bruise/bleed easily.     Objective    BP 124/72 (BP Location: Left Arm, Patient Position: Sitting, Cuff Size: Normal)   Pulse 84   Temp 97.6 F (36.4 C) (Oral)   Wt 124 lb (56.2 kg)   SpO2 94%   BMI 24.22 kg/m     Physical Exam Constitutional:      Appearance: Normal appearance. She is normal weight.  HENT:     Head: Normocephalic and atraumatic.     Right Ear: External ear  normal.     Left Ear: External ear normal.  Eyes:     General: No scleral icterus.    Conjunctiva/sclera: Conjunctivae normal.  Cardiovascular:     Rate and Rhythm: Normal rate and regular rhythm.     Pulses: Normal pulses.     Heart sounds: Murmur heard.  Pulmonary:     Effort: Pulmonary effort is normal.     Breath sounds: Normal breath sounds.  Abdominal:     General: Abdomen is flat. Bowel sounds are normal.     Palpations: Abdomen is soft.     Tenderness: There is no abdominal tenderness.  Skin:    General: Skin is warm and dry.     Capillary Refill: Capillary refill takes less than 2 seconds.  Neurological:     Mental Status: She is alert.     No results found for any visits on 02/13/21.  Assessment & Plan     Problem List Items Addressed This Visit       Digestive   Advanced hepatic fibrosis    - Followed by GI - Due for repeat RUQ ultrasound on 02/26/21 - Will check CMP, CBC, PT/INR today       Relevant Orders   Comprehensive metabolic panel   Calprotectin, Fecal   INR/PT   Amb Referral to Palliative Care   Dental caries    - Stable, next dentist appt on 04/28/21; may consider dental extractions pending platelet value in Oct         Genitourinary   CKD (chronic kidney disease), stage III ([place])    Repeat CMP today; will continue to monitor; encouraged pt. to maintain proper hydration in the setting of chronic diarrhea       Relevant Orders   Amb Referral to Palliative Care     Other   Hyperlipidemia    Continuing to hold statin due to transaminitis; will continue to monitor       Protein-calorie malnutrition, severe    Stable, encouraged pt. to maintain proper nutrition/hydration; repeat CMP, CBC today; will continue to monitor       Thrombocytopenia (HCC)    Repeat CBC today; will continue to monitor       Relevant Orders   CBC w/Diff/Platelet   INR/PT   Amb Referral to Palliative Care   Hypokalemia    Repeat CMP today; will  continue to monitor; encouraged pt. to maintain proper hydration        Relevant Orders   Comprehensive metabolic panel   Magnesium   Diarrhea    - Stable, persistent - Continue cholestyramine 1 packet dissolved in 8 oz water (4 g) per day - Continue imodium 2 mg prn for diarrhea - Fecal calprotectin  -  Encouraged pt. to maintain proper nutrition/hydration - Followed by GI; will continue to monitor       Relevant Orders   Calprotectin, Fecal   Amb Referral to Palliative Care   Transaminitis - Primary    - Repeat CMP today - Followed by GI; will continue to monitor       Relevant Orders   Comprehensive metabolic panel   Leukopenia    - Stable, chronic - Repeat CBC today; will continue to monitor       Relevant Orders   CBC w/Diff/Platelet     Discussed goals of care with patient given recent hospitalization and multiple falls, declining cognitive function and general health.  Patient agrees to palliative care conversation  Return in about 6 weeks (around 03/27/2021) for chronic disease f/u.      Total time spent on today's visit was greater than 40 minutes, including both face-to-face time and nonface-to-face time personally spent on review of chart (labs and imaging), discussing labs and goals, discussing further work-up, treatment options, referrals to specialist if needed, reviewing outside records of pertinent, answering patient's questions, and coordinating care.   Percell Locus, MS3  Patient seen along with MS3 student Percell Locus. I personally evaluated this patient along with the student, and verified all aspects of the history, physical exam, and medical decision making as documented by the student. I agree with the student's documentation and have made all necessary edits.  Petar Mucci, Dionne Bucy, MD, MPH [facility]

## 2021-02-13 NOTE — Assessment & Plan Note (Signed)
Continuing to hold statin due to transaminitis; will continue to monitor

## 2021-02-13 NOTE — Assessment & Plan Note (Addendum)
-   Stable, persistent - Continue cholestyramine 1 packet dissolved in 8 oz water (4 g) per day - Continue imodium 2 mg prn for diarrhea - Fecal calprotectin  - Encouraged pt. to maintain proper nutrition/hydration - Followed by GI; will continue to monitor

## 2021-02-13 NOTE — Assessment & Plan Note (Addendum)
-   Stable, next dentist appt on 04/28/21; may consider dental extractions pending platelet value in Oct

## 2021-02-13 NOTE — Assessment & Plan Note (Signed)
-   Followed by GI - Due for repeat RUQ ultrasound on 02/26/21 - Will check CMP, CBC, PT/INR today

## 2021-02-13 NOTE — Assessment & Plan Note (Signed)
Repeat CMP today; will continue to monitor; encouraged pt. to maintain proper hydration

## 2021-02-13 NOTE — Assessment & Plan Note (Signed)
Repeat CBC today; will continue to monitor

## 2021-02-13 NOTE — Assessment & Plan Note (Signed)
-   Stable, chronic - Repeat CBC today; will continue to monitor

## 2021-02-13 NOTE — Assessment & Plan Note (Signed)
-   Repeat CMP today - Followed by GI; will continue to monitor

## 2021-02-13 NOTE — Telephone Encounter (Signed)
Attempted to contact patient to schedule  Palliative Consult, no answer and mailbox was full.  Attempted to reach patient's niece Vinnie Cardosa, no answer and her mailbox was full.  Sent patient a MyChart message asking her to call me to schedule visit.

## 2021-02-14 ENCOUNTER — Encounter: Payer: Self-pay | Admitting: Family Medicine

## 2021-02-14 ENCOUNTER — Ambulatory Visit
Admission: RE | Admit: 2021-02-14 | Discharge: 2021-02-14 | Disposition: A | Discharge status: Home / Self Care | Payer: Medicare HMO | Source: Ambulatory Visit | Attending: Family Medicine | Admitting: Family Medicine

## 2021-02-14 DIAGNOSIS — N6489 Other specified disorders of breast: Secondary | ICD-10-CM | POA: Diagnosis not present

## 2021-02-14 DIAGNOSIS — D696 Thrombocytopenia, unspecified: Secondary | ICD-10-CM | POA: Diagnosis not present

## 2021-02-14 DIAGNOSIS — D72819 Decreased white blood cell count, unspecified: Secondary | ICD-10-CM | POA: Diagnosis not present

## 2021-02-14 DIAGNOSIS — R928 Other abnormal and inconclusive findings on diagnostic imaging of breast: Secondary | ICD-10-CM

## 2021-02-14 DIAGNOSIS — E876 Hypokalemia: Secondary | ICD-10-CM | POA: Diagnosis not present

## 2021-02-14 DIAGNOSIS — K7402 Hepatic fibrosis, advanced fibrosis: Secondary | ICD-10-CM | POA: Diagnosis not present

## 2021-02-14 DIAGNOSIS — R197 Diarrhea, unspecified: Secondary | ICD-10-CM | POA: Diagnosis not present

## 2021-02-14 DIAGNOSIS — R7401 Elevation of levels of liver transaminase levels: Secondary | ICD-10-CM | POA: Diagnosis not present

## 2021-02-14 DIAGNOSIS — R922 Inconclusive mammogram: Secondary | ICD-10-CM | POA: Diagnosis not present

## 2021-02-17 ENCOUNTER — Telehealth: Payer: Self-pay | Admitting: Student

## 2021-02-17 NOTE — Telephone Encounter (Signed)
Rec'd return call from patient's niece, Mays Kluger, and after discussing the Palliative referral/services with her she was in agreement with beginning services with [place].  I have scheduled an In-home Consult for 02/24/21 @ 3 PM

## 2021-02-19 LAB — COMPREHENSIVE METABOLIC PANEL
ALT: 22 IU/L (ref 0–32)
AST: 37 IU/L (ref 0–40)
Albumin/Globulin Ratio: 1.7 (ref 1.2–2.2)
Albumin: 3.8 g/dL (ref 3.7–4.7)
Alkaline Phosphatase: 194 IU/L — ABNORMAL HIGH (ref 44–121)
BUN/Creatinine Ratio: 10 — ABNORMAL LOW (ref 12–28)
BUN: 10 mg/dL (ref 8–27)
Bilirubin Total: 0.6 mg/dL (ref 0.0–1.2)
CO2: 26 mmol/L (ref 20–29)
Calcium: 8.4 mg/dL — ABNORMAL LOW (ref 8.7–10.3)
Chloride: 102 mmol/L (ref 96–106)
Creatinine, Ser: 1 mg/dL (ref 0.57–1.00)
Globulin, Total: 2.3 g/dL (ref 1.5–4.5)
Glucose: 76 mg/dL (ref 65–99)
Potassium: 3.6 mmol/L (ref 3.5–5.2)
Sodium: 139 mmol/L (ref 134–144)
Total Protein: 6.1 g/dL (ref 6.0–8.5)
eGFR: 58 mL/min/{1.73_m2} — ABNORMAL LOW (ref >59)

## 2021-02-19 LAB — PROTIME-INR

## 2021-02-19 LAB — CBC WITH DIFFERENTIAL/PLATELET
Hematocrit: 47.3 % — ABNORMAL HIGH (ref 34.0–46.6)
Hemoglobin: 15.2 g/dL (ref 11.1–15.9)
MCH: 30.4 pg (ref 26.6–33.0)
MCHC: 32.1 g/dL (ref 31.5–35.7)
MCV: 95 fL (ref 79–97)
Platelets: 144 10*3/uL — ABNORMAL LOW (ref 150–450)
RBC: 5 x10E6/uL (ref 3.77–5.28)
RDW: 14.9 % (ref 11.7–15.4)
WBC: 3.8 10*3/uL (ref 3.4–10.8)

## 2021-02-19 LAB — MAGNESIUM: Magnesium: 2 mg/dL (ref 1.6–2.3)

## 2021-02-20 ENCOUNTER — Ambulatory Visit (INDEPENDENT_AMBULATORY_CARE_PROVIDER_SITE_OTHER): Payer: Medicare Other | Admitting: *Deleted

## 2021-02-20 DIAGNOSIS — E7800 Pure hypercholesterolemia, unspecified: Secondary | ICD-10-CM | POA: Diagnosis not present

## 2021-02-20 DIAGNOSIS — I951 Orthostatic hypotension: Secondary | ICD-10-CM | POA: Diagnosis not present

## 2021-02-20 DIAGNOSIS — N183 Chronic kidney disease, stage 3 unspecified: Secondary | ICD-10-CM | POA: Diagnosis not present

## 2021-02-20 DIAGNOSIS — J9611 Chronic respiratory failure with hypoxia: Secondary | ICD-10-CM | POA: Diagnosis not present

## 2021-02-20 DIAGNOSIS — I5042 Chronic combined systolic (congestive) and diastolic (congestive) heart failure: Secondary | ICD-10-CM | POA: Diagnosis not present

## 2021-02-20 DIAGNOSIS — G2581 Restless legs syndrome: Secondary | ICD-10-CM | POA: Diagnosis not present

## 2021-02-20 DIAGNOSIS — I714 Abdominal aortic aneurysm, without rupture: Secondary | ICD-10-CM | POA: Diagnosis not present

## 2021-02-20 DIAGNOSIS — G8929 Other chronic pain: Secondary | ICD-10-CM | POA: Diagnosis not present

## 2021-02-20 DIAGNOSIS — Z7951 Long term (current) use of inhaled steroids: Secondary | ICD-10-CM | POA: Diagnosis not present

## 2021-02-20 DIAGNOSIS — J441 Chronic obstructive pulmonary disease with (acute) exacerbation: Secondary | ICD-10-CM | POA: Diagnosis not present

## 2021-02-20 DIAGNOSIS — E876 Hypokalemia: Secondary | ICD-10-CM | POA: Diagnosis not present

## 2021-02-20 DIAGNOSIS — K219 Gastro-esophageal reflux disease without esophagitis: Secondary | ICD-10-CM | POA: Diagnosis not present

## 2021-02-20 DIAGNOSIS — J309 Allergic rhinitis, unspecified: Secondary | ICD-10-CM | POA: Diagnosis not present

## 2021-02-20 DIAGNOSIS — F17210 Nicotine dependence, cigarettes, uncomplicated: Secondary | ICD-10-CM | POA: Diagnosis not present

## 2021-02-20 DIAGNOSIS — M5116 Intervertebral disc disorders with radiculopathy, lumbar region: Secondary | ICD-10-CM | POA: Diagnosis not present

## 2021-02-20 DIAGNOSIS — M810 Age-related osteoporosis without current pathological fracture: Secondary | ICD-10-CM | POA: Diagnosis not present

## 2021-02-20 DIAGNOSIS — D631 Anemia in chronic kidney disease: Secondary | ICD-10-CM | POA: Diagnosis not present

## 2021-02-20 DIAGNOSIS — L4052 Psoriatic arthritis mutilans: Secondary | ICD-10-CM | POA: Diagnosis not present

## 2021-02-20 DIAGNOSIS — E440 Moderate protein-calorie malnutrition: Secondary | ICD-10-CM | POA: Diagnosis not present

## 2021-02-20 DIAGNOSIS — I130 Hypertensive heart and chronic kidney disease with heart failure and stage 1 through stage 4 chronic kidney disease, or unspecified chronic kidney disease: Secondary | ICD-10-CM | POA: Diagnosis not present

## 2021-02-20 DIAGNOSIS — A09 Infectious gastroenteritis and colitis, unspecified: Secondary | ICD-10-CM | POA: Diagnosis not present

## 2021-02-20 DIAGNOSIS — R413 Other amnesia: Secondary | ICD-10-CM

## 2021-02-20 DIAGNOSIS — I10 Essential (primary) hypertension: Secondary | ICD-10-CM

## 2021-02-20 DIAGNOSIS — Z96641 Presence of right artificial hip joint: Secondary | ICD-10-CM | POA: Diagnosis not present

## 2021-02-20 DIAGNOSIS — I739 Peripheral vascular disease, unspecified: Secondary | ICD-10-CM | POA: Diagnosis not present

## 2021-02-20 DIAGNOSIS — M1610 Unilateral primary osteoarthritis, unspecified hip: Secondary | ICD-10-CM | POA: Diagnosis not present

## 2021-02-20 NOTE — Chronic Care Management (AMB) (Signed)
Chronic Care Management    Clinical Social Work Note  02/20/2021 Name: Mariah [name] MRN: [id number] DOB: [birth year]  BREELYN BUTCHART is a 75 y.o. year old female who is a primary care patient of Brita Romp, Dionne Bucy, MD. The CCM team was consulted to assist the patient with chronic disease management and/or care coordination needs related to: Level of Care Concerns and Caregiver Stress.   Engaged with patient by telephone for follow up visit in response to provider referral for social work chronic care management and care coordination services.   Consent to Services:  The patient was given information about Chronic Care Management services, agreed to services, and gave verbal consent prior to initiation of services.  Please see initial visit note for detailed documentation.   Patient agreed to services and consent obtained.   Assessment: Review of patient past medical history, allergies, medications, and health status, including review of relevant consultants reports was performed today as part of a comprehensive evaluation and provision of chronic care management and care coordination services.     SDOH (Social Determinants of Health) assessments and interventions performed:    Advanced Directives Status: Not addressed in this encounter.  CCM Care Plan  Allergies  Allergen Reactions   Amoxicillin Diarrhea   Clindamycin/Lincomycin     diarrhea    Outpatient Encounter Medications as of 02/20/2021  Medication Sig   albuterol (VENTOLIN HFA) 108 (90 Base) MCG/ACT inhaler INHALE 1-2 PUFFS INTO THE LUNGS EVERY 6 HOURS AS NEEDED FOR WHEEZING OR SHORTNESS OF BREATH.   betamethasone dipropionate (DIPROLENE) 0.05 % ointment Apply topically daily.   BREO ELLIPTA 100-25 MCG/INH AEPB TAKE 1 PUFF BY MOUTH EVERY DAY   calcipotriene (DOVONOX) 0.005 % cream APPLY TO SKIN ONCE A DAY APPLY DAILY ON HANDS, FEET AND WRIST X 2 WEEKS, THEN USE 5 DAYS A WEEK   Calcipotriene 0.005 % solution APPLY 1 A SMALL  AMOUNT TO SCALP ONCE A DAY APPLY TO SCALP DAILY FOR 2 WEEKS, THEN USE ON SCALP 5 DAYS A WEEK   Cholecalciferol (VITAMIN D3) 2000 units TABS Take 2,000 Units by mouth daily.   clobetasol (TEMOVATE) 0.05 % external solution APPLY TO AFFECTED AREA TWICE A DAY   COSENTYX SENSOREADY, 300 MG, 150 MG/ML SOAJ Inject 2 Syringes into the skin every 28 (twenty-eight) days.   donepezil (ARICEPT) 10 MG tablet Take 10 mg by mouth daily.   ferrous sulfate 325 (65 FE) MG tablet Take 325 mg by mouth 2 (two) times daily.   folic acid (FOLVITE) 1 MG tablet Take 1 mg by mouth daily.   furosemide (LASIX) 40 MG tablet Take 1 tablet (40 mg total) by mouth daily. If weight 127 lb or less, take 20 mg by mouth daily.   gabapentin (NEURONTIN) 300 MG capsule Take 1 capsule (300 mg total) by mouth 2 (two) times daily.   levocetirizine (XYZAL) 5 MG tablet TAKE 1 TABLET BY MOUTH EVERY DAY IN THE EVENING   loperamide (IMODIUM) 2 MG capsule Take 1 capsule (2 mg total) by mouth as needed for diarrhea or loose stools.   metoprolol succinate (TOPROL-XL) 50 MG 24 hr tablet TAKE 1 TABLET BY MOUTH DAILY. TAKE WITH OR IMMEDIATELY FOLLOWING A MEAL.   pantoprazole (PROTONIX) 20 MG tablet TAKE 1 TABLET (20 MG TOTAL) BY MOUTH 2 (TWO) TIMES DAILY BEFORE A MEAL.   potassium chloride SA (KLOR-CON) 20 MEQ tablet Take 2 tablets (40 mEq total) by mouth daily.   rOPINIRole (REQUIP) 1 MG tablet TAKE 1  TABLET BY MOUTH AT BEDTIME.   traMADol (ULTRAM) 50 MG tablet TAKE 1 TABLET BY MOUTH EVERY 6 HOURS AS NEEDED FOR MODERATE OR SEVERE PAIN   No facility-administered encounter medications on file as of 02/20/2021.    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   Congestive heart failure ([place]) 06/18/2020   Weakness    Protein-calorie malnutrition, severe 06/05/2020   Hypoalbuminemia 06/03/2020   Cutaneous horn 06/03/2020   Pleural effusion 06/03/2020   Chronic diastolic CHF (congestive heart failure) ([place]) 06/03/2020   Acute respiratory failure with hypoxia ([place]) 06/02/2020   Irregular heartbeat 05/30/2020   Bilateral edema of lower extremity 05/30/2020   Blood transfusion reaction 05/30/2020   CKD (chronic kidney disease), stage III ([place]) 05/26/2020   Acute deep vein thrombosis (DVT) of tibial vein of left lower extremity ([place]) 04/05/2020   Mental confusion 04/01/2020   Unintentional weight loss 03/01/2020   Memory loss 03/01/2020   Allergic rhinitis 01/10/2018   Cholelithiasis 03/16/2016   Peripheral arterial disease ([place]) 02/05/2016   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   Abdominal aortic aneurysm (AAA) ([place]) 01/13/2016   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   Nerve root inflammation 10/25/2015   Psoriatic arthritis ([place]) 10/07/2015   Degenerative arthritis of hip 10/07/2015   Right hip pain 08/12/2015   Arthralgia of multiple joints 08/12/2015   Age related osteoporosis 08/12/2015   COPD with acute exacerbation ([place]) 06/24/2015   Senile purpura ([place]) 06/24/2015   Abnormal serum level of alkaline phosphatase 06/18/2015   Lethargy 06/18/2015   Gastro-esophageal reflux disease without esophagitis 06/18/2015   Restless leg 06/18/2015   Avitaminosis D 06/18/2015   Mechanical and motor problems with internal organs 10/11/2009   Hyperlipidemia 10/11/2009   Arthritis, degenerative 09/25/2009   Essential (primary) hypertension 09/25/2009   Psoriasis 09/25/2009   Compulsive tobacco user syndrome 09/25/2009    Conditions to be addressed/monitored: Dementia; Memory Deficits  Care Plan :  Dementia (Adult)  Updates made by Vern Claude, LCSW since 02/20/2021 12:00 AM     Problem: Psychosocial Adjustment to Dementia      Goal: Optimal Coping   Start Date: 10/31/2020  This Visit's Progress: On track  Recent Progress: On track  Priority: Medium  Note:   Current Barriers:  Chronic Mental Health needs related to adjustment to memory challenges and loss of independence Memory Deficits Suicidal Ideation/Homicidal Ideation: No  Clinical Social Work Goal(s):  patient will work with SW bi-weekly by telephone or in person  patient will work with SW to address concerns related to loss if independence  Interventions: 1:1 collaboration with Brita Romp, Dionne Bucy, MD regarding development and update of comprehensive plan of care as evidenced by provider attestation and co-signature Follow up phone call to patient's niece to discuss community resource/case management needs Per patient's niece, patient condition continues to decline and she has now been referred for palliative care services. The initial intake appointment is scheduled for Monday, 02/25/23 at 3 pm.. No further community resource needs verbalized at this time, as patient's niece would like case management services through palliative care, however  this social worker will remain available for any additional community resource needs that may arise in the future.   Patient Self Care Activities:  Performs ADL's independently Strong family or social support  Patient Coping Strengths:  Supportive Relationships Family Friends Able to Communicate Effectively  Patient Self Care Deficits:  Unable to perform IADLs independently  Patient Goals:  - talk about feelings with a friend, family or spiritual advisor - practice positive thinking and self-talk - keep a calendar with appointment dates -follow up with palliative care program      Follow Up Plan: Patient's niece to call this social worker with any additional  community resource needs. Patient has been referred for [place], [place] Worker  [facility] [phone]

## 2021-02-21 ENCOUNTER — Telehealth: Payer: Self-pay | Admitting: Student

## 2021-02-21 NOTE — Telephone Encounter (Signed)
Rec'd message that patient's niece, Danuta Huseman, called and needed to reschedule the Palliative Consult.  Ret'd call to niece and this was rescheduled for 02/27/21 @ 12:30 PM.

## 2021-02-24 ENCOUNTER — Other Ambulatory Visit: Payer: Medicare HMO | Admitting: Student

## 2021-02-26 ENCOUNTER — Ambulatory Visit
Admission: RE | Admit: 2021-02-26 | Discharge: 2021-02-26 | Disposition: A | Discharge status: Home / Self Care | Payer: Medicare Other | Source: Ambulatory Visit | Attending: Neurology | Admitting: Neurology

## 2021-02-26 ENCOUNTER — Other Ambulatory Visit: Payer: Self-pay

## 2021-02-26 DIAGNOSIS — Z86718 Personal history of other venous thrombosis and embolism: Secondary | ICD-10-CM | POA: Diagnosis not present

## 2021-02-26 DIAGNOSIS — M79605 Pain in left leg: Secondary | ICD-10-CM | POA: Insufficient documentation

## 2021-02-27 ENCOUNTER — Other Ambulatory Visit: Payer: Medicare Other | Admitting: Student

## 2021-02-27 DIAGNOSIS — K029 Dental caries, unspecified: Secondary | ICD-10-CM | POA: Diagnosis not present

## 2021-02-27 DIAGNOSIS — I10 Essential (primary) hypertension: Secondary | ICD-10-CM

## 2021-02-27 DIAGNOSIS — E46 Unspecified protein-calorie malnutrition: Secondary | ICD-10-CM

## 2021-02-27 DIAGNOSIS — Z515 Encounter for palliative care: Secondary | ICD-10-CM

## 2021-02-27 DIAGNOSIS — R413 Other amnesia: Secondary | ICD-10-CM | POA: Diagnosis not present

## 2021-02-27 NOTE — Progress Notes (Signed)
[facility] Palliative Care Consult Note Telephone: [phone]  Fax: [phone]   Date of encounter: 02/27/21 PATIENT NAME: Mariah [name] [place] [address]   [phone] (home)  DOB: 1945-07-22 MRN: [id number] PRIMARY CARE PROVIDER:    [name], MD,  [address] [place] [address] [phone]  REFERRING PROVIDER:   [name], [place] [address] [place],  [place] [address] [phone]  RESPONSIBLE PARTY:    Contact Information     Name Relation Home Work Mobile   Laurium Niece   [phone]   Bennette,Rayvon Relative [phone]          I met face to face with patient and family in the home. Palliative Care was asked to follow this patient by consultation request of  Bacigalupo, Dionne Bucy, MD to address advance care planning and complex medical decision making. This is the initial visit.                                     ASSESSMENT AND PLAN / RECOMMENDATIONS:   Advance Care Planning/Goals of Care: Goals include to maximize quality of life and symptom management. Patient/health care surrogate gave his/her permission to discuss.Our advance care planning conversation included a discussion about:    The value and importance of advance care planning  Experiences with loved ones who have been seriously ill or have died  Exploration of personal, cultural or spiritual beliefs that might influence medical decisions  Exploration of goals of care in the event of a sudden injury or illness  Brother in law Rayvon has HCPOA. MOST form introduced Decision not to resuscitate or to de-escalate disease focused treatments due to poor prognosis. CODE STATUS: Full Code  Education provided on palliative medicine vs. Hospice services. Palliative medicine will continue to provide support.  Patient expresses to live independently, although this may not  be possible; she requires  cueing/reminders to take medications, some assistance with adl's.   Symptom Management/Plan:  Memory impairment-patient does require assistance with adl's, medication adherence. Continue medication box, redirect/reorient as needed. Continue Aricept as directed.   Dental carries-patient has 10 teeth that are in ill repair, needing to be pulled. Patient is encouraged to take clindamycin that was recently prescribed by dentist as directed, until completed.   Medication adherence-we discussed the need to take medications as directed. Niece is currently filling a pill box, but patient does not always take as prescribed.   Hypertension-blood pressure elevated during visit today, although patient had not taken her AM medications. Continue toprol XL as directed. Check b/p in the home.   Protein calorie malnutrition-weight loss noted over past 10 months. Patient is eating mostly snack foods, family does encourage well balanced diet. Nutritional supplement encouraged daily.   Will include Palliative SW for any recommendations regarding Senior Living communities or Assisted Living that patient may qualify for as the living arrangements are causing a strain in the household.   Follow up Palliative Care Visit: Palliative care will continue to follow for complex medical decision making, advance care planning, and clarification of goals. Return in 4 weeks or prn.  I spent 75 minutes providing this consultation. More than 50% of the time in this consultation was spent in counseling and care coordination.   PPS: 60%  HOSPICE ELIGIBILITY/DIAGNOSIS: TBD  Chief Complaint: Palliative Medicine initial consult.   HISTORY OF PRESENT  ILLNESS:  TYIANA HILL is a 75 y.o. year old female  with Transaminitis, advanced hepatic fibrosis, CKD 3, hyperlipidemia, severe protein calorie malnutrition, COPD, chronic diastolic heart failure, leukopenia, psoriatic arthritis.  Patient currently resides with her niece and  family; has resided there since November. Patient is able to complete most adl's independently she does require assist with bathing. She has occasional bowel incontinent episodes, frequent loose stools. Takes imodium 1-2 times a day PRN. She endorses a good appetite although family states her appetite is not good. Patient states she never kept up with her weight; family reports patient going from a size 2X to a large in the past 8-10 months. Patient was to start clindamycin on 7/27 for oral infection; she has only taken a couple of tablets. She reports having 10 teeth that need to be pulled; they were advised by PCP to not receive general anesthesia. No recent falls or injury. Hx of DVT; doppler US yesterday shows no sign of DVT. [facility] has completed.  A 10 point review of systems is negative, except for the pertinent positives and negatives detailed in the HPI.    History obtained from review of EMR, discussion with primary team, and interview with family, facility staff/caregiver and/or Mariah [name].  I reviewed available labs, medications, imaging, studies and related documents from the EMR.  Records reviewed and summarized above.    Physical Exam:  Constitutional: NAD General: frail appearing, thin EYES: anicteric sclera, lids intact, no discharge  ENMT: intact hearing, oral mucous membranes moist, dentition intact CV: S1S2, RRR, no LE edema Pulmonary: LCTA, no increased work of breathing, no cough, room air Abdomen: normo-active BS + 4 quadrants, soft and non tender GU: deferred MSK: moves all extremities, ambulatory with cane Skin: warm and dry, no rashes or wounds on visible skin Neuro:  no generalized weakness, A & O x 3, forgetful Psych: non-anxious affect Hem/lymph/immuno: no widespread bruising CURRENT PROBLEM LIST:  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   Leukopenia 02/13/2021   Dental caries 02/13/2021   Transaminitis  01/11/2021   COPD (chronic obstructive pulmonary disease) (HCC)    Tobacco abuse    Diarrhea 01/10/2021   Thrombocytopenia ([place]) 09/19/2020   Hypokalemia 09/19/2020   Orthostatic hypotension 09/18/2020   Syncope 09/17/2020   History of DVT (deep vein thrombosis) 07/09/2020   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   Congestive heart failure ([place]) 06/18/2020   Weakness    Protein-calorie malnutrition, severe 06/05/2020   Hypoalbuminemia 06/03/2020   Cutaneous horn 06/03/2020   Pleural effusion 06/03/2020   Chronic diastolic CHF (congestive heart failure) ([place]) 06/03/2020   Acute respiratory failure with hypoxia ([place]) 06/02/2020   Irregular heartbeat 05/30/2020   Bilateral edema of lower extremity 05/30/2020   Blood transfusion reaction 05/30/2020   CKD (chronic kidney disease), stage III ([place]) 05/26/2020   Acute deep vein thrombosis (DVT) of tibial vein of left lower extremity ([place]) 04/05/2020   Mental confusion 04/01/2020   Unintentional weight loss 03/01/2020   Memory loss 03/01/2020   Allergic rhinitis 01/10/2018   Cholelithiasis 03/16/2016   Peripheral arterial disease ([place]) 02/05/2016   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   Abdominal aortic aneurysm (AAA) ([place]) 01/13/2016   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   Nerve root inflammation 10/25/2015   Psoriatic arthritis ([place]) 10/07/2015   Degenerative arthritis of hip 10/07/2015   Right hip pain 08/12/2015   Arthralgia of multiple joints 08/12/2015   Age related osteoporosis 08/12/2015  COPD with acute exacerbation ([place]) 06/24/2015   Senile purpura ([place]) 06/24/2015   Abnormal serum level of alkaline phosphatase 06/18/2015   Lethargy 06/18/2015   Gastro-esophageal reflux disease without esophagitis 06/18/2015   Restless leg 06/18/2015   Avitaminosis D 06/18/2015   Mechanical and motor problems with internal organs 10/11/2009   Hyperlipidemia 10/11/2009   Arthritis, degenerative 09/25/2009   Essential (primary)  hypertension 09/25/2009   Psoriasis 09/25/2009   Compulsive tobacco user syndrome 09/25/2009   PAST MEDICAL HISTORY:  Active Ambulatory Problems    Diagnosis Date Noted   Arthritis, degenerative 09/25/2009   Mechanical and motor problems with internal organs 10/11/2009   Abnormal serum level of alkaline phosphatase 06/18/2015   Essential (primary) hypertension 09/25/2009   Lethargy 06/18/2015   Hyperlipidemia 10/11/2009   Psoriasis 09/25/2009   Gastro-esophageal reflux disease without esophagitis 06/18/2015   Restless leg 06/18/2015   Compulsive tobacco user syndrome 09/25/2009   Avitaminosis D 06/18/2015   COPD with acute exacerbation ([place]) 06/24/2015   Senile purpura ([place]) 06/24/2015   Psoriatic arthritis ([place]) 10/07/2015   Nerve root inflammation 10/25/2015   Degenerative arthritis of hip 10/07/2015   Right hip pain 08/12/2015   Arthralgia of multiple joints 08/12/2015   Age related osteoporosis 08/12/2015   Abdominal aortic aneurysm (AAA) ([place]) 01/13/2016   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   Peripheral arterial disease ([place]) 02/05/2016   Cholelithiasis 03/16/2016   Allergic rhinitis 01/10/2018   Unintentional weight loss 03/01/2020   Memory loss 03/01/2020   Acute deep vein thrombosis (DVT) of tibial vein of left lower extremity ([place]) 04/05/2020   CKD (chronic kidney disease), stage III ([place]) 05/26/2020   Irregular heartbeat 05/30/2020   Bilateral edema of lower extremity 05/30/2020   Blood transfusion reaction 05/30/2020   Acute respiratory failure with hypoxia ([place]) 06/02/2020   Hypoalbuminemia 06/03/2020   Cutaneous horn 06/03/2020   Pleural effusion 06/03/2020   Chronic diastolic CHF (congestive heart failure) ([place]) 06/03/2020   Protein-calorie malnutrition, severe 06/05/2020   Weakness    Congestive heart failure ([place]) 06/18/2020   Mental confusion 04/01/2020   History of DVT (deep vein thrombosis) 07/09/2020   Multifocal  atrial tachycardia ([place]) 07/09/2020   Syncope 09/17/2020   Orthostatic hypotension 09/18/2020   Thrombocytopenia ([place]) 09/19/2020   Hypokalemia 09/19/2020   Diarrhea 01/10/2021   Transaminitis 01/11/2021   COPD (chronic obstructive pulmonary disease) ([place])    Tobacco abuse    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   Leukopenia 02/13/2021   Dental caries 02/13/2021   Resolved Ambulatory Problems    Diagnosis Date Noted   Atypical chest pain 06/18/2015   Elevated hemoglobin (HCC) 06/18/2015   Fistula 06/18/2015   Cephalalgia 09/25/2009   Blood glucose elevated 06/18/2015   Leg pain, right 06/18/2015   Disease of accessory sinus 06/18/2015   Current tobacco use 06/24/2015   Need for vaccination with 13-polyvalent pneumococcal conjugate vaccine 06/24/2015   Elevated alkaline phosphatase level 06/24/2015   Dyspnea on exertion 07/15/2017   Fall (on)(from) sidewalk curb, initial encounter 03/01/2020   Pancytopenia ([place]) 05/26/2020   Hematoma of right thigh 05/26/2020   Finger fracture, right 05/26/2020   Dyspnea 05/26/2020   Head injury 05/30/2020   Closed fracture of right hand 05/30/2020   CAP (community acquired pneumonia) 06/03/2020   Hematoma of right hip 06/18/2020   Closed nondisplaced fracture of distal phalanx of right little finger 06/06/2020   Closed nondisplaced fracture of distal phalanx of right thumb 06/06/2020   Rash of  back 05/15/2019   Left arm cellulitis 06/26/2020   Pneumonia of right lung due to infectious organism 07/09/2020   Legionella infection ([place]) 07/17/2020   Past Medical History:  Diagnosis Date   Arthritis    Chronic diastolic heart failure (HCC)    GERD (gastroesophageal reflux disease)    Headache    History of adenomatous polyp of colon    Hypertension    Right lumbar radiculitis    Vitamin D deficiency    SOCIAL HX:  Social History   Tobacco Use   Smoking status: Every Day    Packs/day: 1.50    Years: 40.00    Pack years: 60.00     Types: Cigarettes   Smokeless tobacco: Never   Tobacco comments:    0.5 PPD 06/19/2020  Substance Use Topics   Alcohol use: Yes    Comment: occasionally glass of wine   FAMILY HX:  Family History  Problem Relation Age of Onset   Cancer Mother        kidney   Arthritis Father    Heart disease Father    COPD Sister    Fibromyalgia Sister    Osteoarthritis Sister    Osteoarthritis Maternal Aunt    Colon cancer Paternal Aunt    Colon cancer Paternal Uncle    Colon cancer Paternal Grandmother    Breast cancer Neg Hx       ALLERGIES:  Allergies  Allergen Reactions   Amoxicillin Diarrhea   Clindamycin/Lincomycin     diarrhea     PERTINENT MEDICATIONS:  Outpatient Encounter Medications as of 02/27/2021  Medication Sig   albuterol (VENTOLIN HFA) 108 (90 Base) MCG/ACT inhaler INHALE 1-2 PUFFS INTO THE LUNGS EVERY 6 HOURS AS NEEDED FOR WHEEZING OR SHORTNESS OF BREATH.   betamethasone dipropionate (DIPROLENE) 0.05 % ointment Apply topically daily.   BREO ELLIPTA 100-25 MCG/INH AEPB TAKE 1 PUFF BY MOUTH EVERY DAY   calcipotriene (DOVONOX) 0.005 % cream APPLY TO SKIN ONCE A DAY APPLY DAILY ON HANDS, FEET AND WRIST X 2 WEEKS, THEN USE 5 DAYS A WEEK   Calcipotriene 0.005 % solution APPLY 1 A SMALL AMOUNT TO SCALP ONCE A DAY APPLY TO SCALP DAILY FOR 2 WEEKS, THEN USE ON SCALP 5 DAYS A WEEK   Cholecalciferol (VITAMIN D3) 2000 units TABS Take 2,000 Units by mouth daily.   clobetasol (TEMOVATE) 0.05 % external solution APPLY TO AFFECTED AREA TWICE A DAY   COSENTYX SENSOREADY, 300 MG, 150 MG/ML SOAJ Inject 2 Syringes into the skin every 28 (twenty-eight) days.   donepezil (ARICEPT) 10 MG tablet Take 10 mg by mouth daily.   ferrous sulfate 325 (65 FE) MG tablet Take 325 mg by mouth 2 (two) times daily.   folic acid (FOLVITE) 1 MG tablet Take 1 mg by mouth daily.   furosemide (LASIX) 40 MG tablet Take 1 tablet (40 mg total) by mouth daily. If weight 127 lb or less, take 20 mg by mouth daily.    gabapentin (NEURONTIN) 300 MG capsule Take 1 capsule (300 mg total) by mouth 2 (two) times daily.   levocetirizine (XYZAL) 5 MG tablet TAKE 1 TABLET BY MOUTH EVERY DAY IN THE EVENING   loperamide (IMODIUM) 2 MG capsule Take 1 capsule (2 mg total) by mouth as needed for diarrhea or loose stools.   metoprolol succinate (TOPROL-XL) 50 MG 24 hr tablet TAKE 1 TABLET BY MOUTH DAILY. TAKE WITH OR IMMEDIATELY FOLLOWING A MEAL.   pantoprazole (PROTONIX) 20 MG tablet TAKE 1  TABLET (20 MG TOTAL) BY MOUTH 2 (TWO) TIMES DAILY BEFORE A MEAL.   potassium chloride SA (KLOR-CON) 20 MEQ tablet Take 2 tablets (40 mEq total) by mouth daily.   rOPINIRole (REQUIP) 1 MG tablet TAKE 1 TABLET BY MOUTH AT BEDTIME.   traMADol (ULTRAM) 50 MG tablet TAKE 1 TABLET BY MOUTH EVERY 6 HOURS AS NEEDED FOR MODERATE OR SEVERE PAIN   No facility-administered encounter medications on file as of 02/27/2021.   Thank you for the opportunity to participate in the care of Mariah [name].  The palliative care team will continue to follow. Please call our office at [phone] if we can be of additional assistance.   Ezekiel Slocumb, NP   COVID-19 PATIENT SCREENING TOOL Asked and negative response unless otherwise noted:  Have you had symptoms of covid, tested positive or been in contact with someone with symptoms/positive test in the past 5-10 days? No

## 2021-02-28 ENCOUNTER — Ambulatory Visit: Payer: Self-pay

## 2021-02-28 DIAGNOSIS — J449 Chronic obstructive pulmonary disease, unspecified: Secondary | ICD-10-CM

## 2021-02-28 NOTE — Chronic Care Management (AMB) (Signed)
  Chronic Care Management   CCM RN Visit Note  02/28/2021 Name: Mariah [name] MRN: [id number] DOB: 02-06-1946  Subjective: Mariah [name] is a 75 y.o. year old female who is a primary care patient of Bacigalupo, Dionne Bucy, MD. The care management team was consulted for assistance with disease management and care coordination needs.     Brief outreach with patient's niece/caregiver Mariah [name]. Reports being busy/away from the home at the time of the call but able to confirm that Mariah [name] has needed medications. Also confirmed that concerns regarding chronic diarrhea have been addressed. She will call back when she is available to further discuss her aunt's care management needs.   [name] Care Management [facility] [phone]

## 2021-03-04 ENCOUNTER — Telehealth: Payer: Self-pay

## 2021-03-04 NOTE — Telephone Encounter (Signed)
03/04/21 @ 1230PM: Palliative care SW outreached patients niece, Tyanne, per [facility] NP - L. Rivers, request to address needs and discuss placement options.  Call unsuccessful. SW LVM. Awaiting return call.

## 2021-03-10 ENCOUNTER — Telehealth: Payer: Self-pay

## 2021-03-10 NOTE — Telephone Encounter (Signed)
03/10/21 '@11'$ :30 AM: Palliative care SW faxed PCS application to [facility] for review of potential in  home services.

## 2021-03-19 DIAGNOSIS — H905 Unspecified sensorineural hearing loss: Secondary | ICD-10-CM | POA: Diagnosis not present

## 2021-03-19 DIAGNOSIS — J449 Chronic obstructive pulmonary disease, unspecified: Secondary | ICD-10-CM | POA: Diagnosis not present

## 2021-03-19 DIAGNOSIS — I10 Essential (primary) hypertension: Secondary | ICD-10-CM

## 2021-03-25 ENCOUNTER — Telehealth: Payer: Self-pay

## 2021-03-25 ENCOUNTER — Ambulatory Visit: Payer: Self-pay | Admitting: *Deleted

## 2021-03-25 NOTE — Telephone Encounter (Signed)
Pt's niece called to report that the patient has been having diarrhea accidents, is always cold, and seems to be very tired. Says this has been going on for 2 weeks, getting worse. Palliative care will be returning next week, pt's niece believes she believes she needs lab work. She is not eating as much and has lost weight. Pt's niece has a phone appt at 2:00pm with her therapist, she is requesting to speak to nurse before 2 or after 3.   Called patient's niece, Janeyah Sastry to review patient's symptoms . No  answer af 3:14 pm. Left voicemail to call clinic back .

## 2021-03-25 NOTE — Telephone Encounter (Signed)
Pt.'s niece reports pt. Has increased fatigue and feeling cold "all the time." Has decreased appetite as well.She feels like she needs lab work done. Requesting palliative care draw the labs.Please advise .     Answer Assessment - Initial Assessment Questions 1. DESCRIPTION: "Describe how you are feeling."     Tired 2. SEVERITY: "How bad is it?"  "Can you stand and walk?"   - MILD - Feels weak or tired, but does not interfere with work, school or normal activities   - [place] to stand and walk; weakness interferes with work, school, or normal activities   - SEVERE - Unable to stand or walk     Moderate 3. ONSET:  "When did the weakness begin?"     Started 2 months ago 4. CAUSE: "What do you think is causing the weakness?"     Unsure 5. MEDICINES: "Have you recently started a new medicine or had a change in the amount of a medicine?"     No 6. OTHER SYMPTOMS: "Do you have any other symptoms?" (e.g., chest pain, fever, cough, SOB, vomiting, diarrhea, bleeding, other areas of pain)     Diarrhea 7. PREGNANCY: "Is there any chance you are pregnant?" "When was your last menstrual period?"     No  Protocols used: Weakness (Generalized) and Fatigue-A-AH

## 2021-03-25 NOTE — Telephone Encounter (Signed)
Received return VM from patient's niece. Returned call. VM left.

## 2021-03-25 NOTE — Telephone Encounter (Signed)
At the request of Latoya NP, returned call to patient's niece. VM left with call back information.

## 2021-03-26 ENCOUNTER — Emergency Department
Admission: EM | Admit: 2021-03-26 | Discharge: 2021-03-26 | Disposition: A | Discharge status: Home / Self Care | Payer: Medicare Other | Attending: Emergency Medicine | Admitting: Emergency Medicine

## 2021-03-26 ENCOUNTER — Other Ambulatory Visit: Payer: Self-pay

## 2021-03-26 DIAGNOSIS — R5383 Other fatigue: Secondary | ICD-10-CM | POA: Diagnosis not present

## 2021-03-26 DIAGNOSIS — F17210 Nicotine dependence, cigarettes, uncomplicated: Secondary | ICD-10-CM | POA: Insufficient documentation

## 2021-03-26 DIAGNOSIS — N183 Chronic kidney disease, stage 3 unspecified: Secondary | ICD-10-CM | POA: Diagnosis not present

## 2021-03-26 DIAGNOSIS — I5032 Chronic diastolic (congestive) heart failure: Secondary | ICD-10-CM | POA: Insufficient documentation

## 2021-03-26 DIAGNOSIS — I130 Hypertensive heart and chronic kidney disease with heart failure and stage 1 through stage 4 chronic kidney disease, or unspecified chronic kidney disease: Secondary | ICD-10-CM | POA: Insufficient documentation

## 2021-03-26 DIAGNOSIS — Z96641 Presence of right artificial hip joint: Secondary | ICD-10-CM | POA: Insufficient documentation

## 2021-03-26 DIAGNOSIS — J441 Chronic obstructive pulmonary disease with (acute) exacerbation: Secondary | ICD-10-CM | POA: Diagnosis not present

## 2021-03-26 LAB — CBC
HCT: 38.4 % (ref 36.0–46.0)
Hemoglobin: 13.6 g/dL (ref 12.0–15.0)
MCH: 32.9 pg (ref 26.0–34.0)
MCHC: 35.4 g/dL (ref 30.0–36.0)
MCV: 93 fL (ref 80.0–100.0)
Platelets: 149 10*3/uL — ABNORMAL LOW (ref 150–400)
RBC: 4.13 MIL/uL (ref 3.87–5.11)
RDW: 15 % (ref 11.5–15.5)
WBC: 4.8 10*3/uL (ref 4.0–10.5)
nRBC: 0 % (ref 0.0–0.2)

## 2021-03-26 LAB — C DIFFICILE QUICK SCREEN W PCR REFLEX
C Diff antigen: NEGATIVE
C Diff interpretation: NOT DETECTED
C Diff toxin: NEGATIVE

## 2021-03-26 LAB — COMPREHENSIVE METABOLIC PANEL
ALT: 18 U/L (ref 0–44)
AST: 29 U/L (ref 15–41)
Albumin: 2.8 g/dL — ABNORMAL LOW (ref 3.5–5.0)
Alkaline Phosphatase: 177 U/L — ABNORMAL HIGH (ref 38–126)
Anion gap: 8 (ref 5–15)
BUN: 8 mg/dL (ref 8–23)
CO2: 26 mmol/L (ref 22–32)
Calcium: 8.3 mg/dL — ABNORMAL LOW (ref 8.9–10.3)
Chloride: 104 mmol/L (ref 98–111)
Creatinine, Ser: 1.13 mg/dL — ABNORMAL HIGH (ref 0.44–1.00)
GFR, Estimated: 50 mL/min — ABNORMAL LOW (ref >60)
Glucose, Bld: 103 mg/dL — ABNORMAL HIGH (ref 70–99)
Potassium: 3.2 mmol/L — ABNORMAL LOW (ref 3.5–5.1)
Sodium: 138 mmol/L (ref 135–145)
Total Bilirubin: 0.9 mg/dL (ref 0.3–1.2)
Total Protein: 5.8 g/dL — ABNORMAL LOW (ref 6.5–8.1)

## 2021-03-26 LAB — LIPASE, BLOOD: Lipase: 35 U/L (ref 11–51)

## 2021-03-26 MED ORDER — POTASSIUM CHLORIDE CRYS ER 20 MEQ PO TBCR
40.0000 meq | EXTENDED_RELEASE_TABLET | Freq: Once | ORAL | Status: AC
  Administered 2021-03-26: 40 meq via ORAL
  Filled 2021-03-26: qty 2

## 2021-03-26 MED ORDER — POTASSIUM CHLORIDE CRYS ER 20 MEQ PO TBCR: 20.0000 meq | EXTENDED_RELEASE_TABLET | Freq: Two times a day (BID) | ORAL | 0 refills | Status: DC

## 2021-03-26 NOTE — ED Triage Notes (Addendum)
Pt come with c/o fatigue and diarrhea. Caretaker reports this has been ongoing since June. Pt states it isn't diarrhea to her.  Pt denies any pain. Pt states feeling tired. Pt states chills  Family reports pt was advised by PCP and Pallative care to come here. Pt did go to [facility] but sent here. Pt has ESRD and COPD

## 2021-03-26 NOTE — ED Notes (Signed)
Hats placed in commode to collect urine and stool samples.

## 2021-03-26 NOTE — ED Notes (Signed)
Pt provided w/ coffee.

## 2021-03-26 NOTE — ED Notes (Signed)
Pt given graham crackers, peanut butter, and diet coke.

## 2021-03-26 NOTE — ED Provider Notes (Signed)
Patient was signed out to me at 3 PM pending urinalysis.  She is 40 presenting with increased fatigue.  Her blood work was notable for mild hypokalemia otherwise reassuring.  We were waiting on both a stool sample and a urine as she is having increasing diarrhea.  Patient give a stool sample and C. difficile was negative.  Unfortunately throughout the 4-hour period that she was in the ED with myself she had multiple episodes of diarrhea and urine at the same time we were unable to collect a sample.  Patient denies any urinary symptoms at this time.  Will discharge.  I advised that she follow-up with a primary care provider.   Rada Hay, MD 03/26/21 [phone]

## 2021-03-26 NOTE — ED Provider Notes (Signed)
[facility] Emergency Department Provider Note  ____________________________________________  Time seen: Approximately 1:50 PM  I have reviewed the triage vital signs and the nursing notes.   HISTORY  Chief Complaint Fatigue   HPI Mariah [name] is a 75 y.o. female with a history of COPD, GERD, hypertension who is sent to the ED for evaluation due to fatigue for the past 3 months.  She is also having diarrhea which the patient reports is chronic.  She was previously hospitalized about 3 months ago due to transaminitis with unrevealing work-up.  Patient denies vomiting, denies abdominal pain.  Family member at bedside notes that the patient has been sleeping more than usual for the past 2 days and seems to be having chills.    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   Leukopenia 02/13/2021   Dental caries 02/13/2021   Transaminitis 01/11/2021   COPD (chronic obstructive pulmonary disease) (HCC)    Tobacco abuse    Diarrhea 01/10/2021   Thrombocytopenia (HCC) 09/19/2020   Hypokalemia 09/19/2020   Orthostatic hypotension 09/18/2020   Syncope 09/17/2020   History of DVT (deep vein thrombosis) 07/09/2020   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   Congestive heart failure ([place]) 06/18/2020   Weakness    Protein-calorie malnutrition, severe 06/05/2020   Hypoalbuminemia 06/03/2020   Cutaneous horn 06/03/2020    Pleural effusion 06/03/2020   Chronic diastolic CHF (congestive heart failure) ([place]) 06/03/2020   Acute respiratory failure with hypoxia ([place]) 06/02/2020   Irregular heartbeat 05/30/2020   Bilateral edema of lower extremity 05/30/2020   Blood transfusion reaction 05/30/2020   CKD (chronic kidney disease), stage III ([place]) 05/26/2020   Acute deep vein thrombosis (DVT) of tibial vein of left lower extremity ([place]) 04/05/2020   Mental confusion 04/01/2020   Unintentional weight loss 03/01/2020   Memory loss 03/01/2020   Allergic rhinitis 01/10/2018   Cholelithiasis 03/16/2016   Peripheral arterial disease ([place]) 02/05/2016   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   Abdominal aortic aneurysm (AAA) ([place]) 01/13/2016   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   Nerve root inflammation 10/25/2015   Psoriatic arthritis ([place]) 10/07/2015   Degenerative arthritis of hip 10/07/2015   Right hip pain 08/12/2015   Arthralgia of multiple joints 08/12/2015   Age related osteoporosis 08/12/2015   COPD with acute exacerbation ([place]) 06/24/2015   Senile purpura ([place]) 06/24/2015   Abnormal serum level of alkaline phosphatase 06/18/2015   Lethargy 06/18/2015   Gastro-esophageal reflux disease without esophagitis 06/18/2015   Restless leg 06/18/2015   Avitaminosis D 06/18/2015   Mechanical and motor problems with internal organs 10/11/2009   Hyperlipidemia 10/11/2009   Arthritis, degenerative 09/25/2009   Essential (primary) hypertension 09/25/2009   Psoriasis 09/25/2009   Compulsive tobacco user syndrome 09/25/2009     Past Surgical History:  Procedure Laterality Date   APPENDECTOMY  2000   BREAST BIOPSY Right 09/2016  radial scar;COMPLEX SCLEROSING LESION   BREAST LUMPECTOMY Right 11/24/2016   COMPLEX SCLEROSING LESION   BREAST LUMPECTOMY WITH NEEDLE LOCALIZATION Right 11/24/2016   Procedure: BREAST LUMPECTOMY WITH NEEDLE LOCALIZATION;  Surgeon: Leonie Green, MD;  Location: [facility] ORS;  Service: General;   Laterality: Right;   COLONOSCOPY WITH PROPOFOL N/A 08/10/2016   Procedure: COLONOSCOPY WITH PROPOFOL;  Surgeon: Manya Silvas, MD;  Location: [place];  Service: Endoscopy;  Laterality: N/A;   ESOPHAGOGASTRODUODENOSCOPY (EGD) WITH PROPOFOL N/A 08/10/2016   Procedure: ESOPHAGOGASTRODUODENOSCOPY (EGD) WITH PROPOFOL;  Surgeon: Manya Silvas, MD;  Location: [facility] ENDOSCOPY;  Service: Endoscopy;  Laterality: N/A;   FOOT SURGERY Bilateral    JOINT REPLACEMENT Right 01/27/2016   hip   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 Authorizing Provider  albuterol (VENTOLIN HFA) 108 (90 Base) MCG/ACT inhaler INHALE 1-2 PUFFS INTO THE LUNGS EVERY 6 HOURS AS NEEDED FOR WHEEZING OR SHORTNESS OF BREATH. 05/22/20   Carles Collet M, PA-C  betamethasone dipropionate (DIPROLENE) 0.05 % ointment Apply topically daily. 01/01/20   Ralene Bathe, MD  BREO ELLIPTA 100-25 MCG/INH AEPB TAKE 1 PUFF BY MOUTH EVERY DAY 09/01/20   [name], MD  calcipotriene (DOVONOX) 0.005 % cream APPLY TO SKIN ONCE A DAY APPLY DAILY ON HANDS, FEET AND WRIST X 2 WEEKS, THEN USE 5 DAYS A WEEK 01/01/20   Ralene Bathe, MD  Calcipotriene 0.005 % solution APPLY 1 A SMALL AMOUNT TO SCALP ONCE A DAY APPLY TO SCALP DAILY FOR 2 WEEKS, THEN USE ON SCALP 5 DAYS A WEEK 09/02/20   Ralene Bathe, MD  Cholecalciferol (VITAMIN D3) 2000 units TABS Take 2,000 Units by mouth daily.    [provider]  clobetasol (TEMOVATE) 0.05 % external solution APPLY TO AFFECTED AREA TWICE A DAY 09/01/20   Bacigalupo, Dionne Bucy, MD  COSENTYX SENSOREADY, 300 MG, 150 MG/ML SOAJ Inject 2 Syringes into the skin every 28 (twenty-eight) days. 05/31/20   [provider]  donepezil (ARICEPT) 10 MG  tablet Take 10 mg by mouth daily. 07/01/20   [provider]  ferrous sulfate 325 (65 FE) MG tablet Take 325 mg by mouth 2 (two) times daily. 05/28/20   [provider]  folic acid (FOLVITE) 1 MG tablet Take 1 mg by mouth daily. 09/02/20   [provider]  furosemide (LASIX) 40 MG tablet Take 1 tablet (40 mg total) by mouth daily. If weight 127 lb or less, take 20 mg by mouth daily. Patient not taking: Reported on 02/27/2021 01/16/21   Eugenie Filler, MD  gabapentin (NEURONTIN) 300 MG capsule Take 1 capsule (300 mg total) by mouth 2 (two) times daily. 10/08/20   [name], MD  levocetirizine (XYZAL) 5 MG tablet TAKE 1 TABLET BY MOUTH EVERY DAY IN THE EVENING 12/18/20   Bacigalupo, Dionne Bucy, MD  loperamide (IMODIUM) 2 MG capsule Take 1 capsule (2 mg total) by mouth as needed for diarrhea or loose stools. 01/13/21   Eugenie Filler, MD  metoprolol succinate (TOPROL-XL) 50 MG 24 hr tablet TAKE 1 TABLET BY MOUTH DAILY. TAKE WITH OR IMMEDIATELY FOLLOWING A MEAL. 11/25/20   Bacigalupo, Dionne Bucy, MD  pantoprazole (PROTONIX) 20 MG tablet TAKE  1 TABLET (20 MG TOTAL) BY MOUTH 2 (TWO) TIMES DAILY BEFORE A MEAL. 06/26/20   Bacigalupo, Dionne Bucy, MD  potassium chloride SA (KLOR-CON) 20 MEQ tablet Take 2 tablets (40 mEq total) by mouth daily. Patient not taking: Reported on 02/27/2021 01/16/21   Eugenie Filler, MD  rOPINIRole (REQUIP) 1 MG tablet TAKE 1 TABLET BY MOUTH AT BEDTIME. 01/02/21   Bacigalupo, Dionne Bucy, MD  traMADol (ULTRAM) 50 MG tablet TAKE 1 TABLET BY MOUTH EVERY 6 HOURS AS NEEDED FOR MODERATE OR SEVERE PAIN 12/18/20   Bacigalupo, Dionne Bucy, MD     Allergies Amoxicillin and Clindamycin/lincomycin   Family History  Problem Relation Age of Onset   Cancer Mother        kidney   Arthritis Father    Heart disease Father    COPD Sister    Fibromyalgia Sister    Osteoarthritis Sister    Osteoarthritis Maternal Aunt    Colon cancer Paternal Aunt    Colon cancer  Paternal Uncle    Colon cancer Paternal Grandmother    Breast cancer Neg Hx     Social History Social History   Tobacco Use   Smoking status: Every Day    Packs/day: 1.50    Years: 40.00    Pack years: 60.00    Types: Cigarettes   Smokeless tobacco: Never   Tobacco comments:    0.5 PPD 06/19/2020  Vaping Use   Vaping Use: Some days  Substance Use Topics   Alcohol use: Yes    Comment: occasionally glass of wine   Drug use: No    Review of Systems  Constitutional:   No fever positive chills.  ENT:   No sore throat. No rhinorrhea. Cardiovascular:   No chest pain or syncope. Respiratory:   No dyspnea or cough. Gastrointestinal:   Negative for abdominal pain, vomiting, positive chronic diarrhea.  Musculoskeletal:   Negative for focal pain or swelling All other systems reviewed and are negative except as documented above in ROS and HPI.  ____________________________________________   PHYSICAL EXAM:  VITAL SIGNS: ED Triage Vitals  Enc Vitals Group     BP 03/26/21 1234 126/69     Pulse Rate 03/26/21 1234 65     Resp 03/26/21 1234 18     Temp 03/26/21 1235 97.6 F (36.4 C)     Temp Source 03/26/21 1235 Oral     SpO2 03/26/21 1234 100 %     Weight --      Height --      Head Circumference --      Peak Flow --      Pain Score 03/26/21 1231 0     Pain Loc --      Pain Edu? --      Excl. in [place]? --     Vital signs reviewed, nursing assessments reviewed.   Constitutional:   Alert and oriented. Non-toxic appearance. Eyes:   Conjunctivae are normal. EOMI. PERRL. ENT      Head:   Normocephalic and atraumatic.      Nose:   Wearing a mask.      Mouth/Throat:   Wearing a mask.      Neck:   No meningismus. Full ROM. Hematological/Lymphatic/Immunilogical:   No cervical lymphadenopathy. Cardiovascular:   RRR. Symmetric bilateral radial and DP pulses.  No murmurs. Cap refill less than 2 seconds. Respiratory:   Normal respiratory effort without tachypnea/retractions.  Breath sounds are clear and equal bilaterally. No wheezes/rales/rhonchi. Gastrointestinal:  Soft and nontender. Non distended. There is no CVA tenderness.  No rebound, rigidity, or guarding. Genitourinary:   deferred Musculoskeletal:   Normal range of motion in all extremities. No joint effusions.  No lower extremity tenderness.  No edema. Neurologic:   Normal speech and language.  Motor grossly intact. No acute focal neurologic deficits are appreciated.  Skin:    Skin is warm, dry and intact. No rash noted.  No petechiae, purpura, or bullae.  ____________________________________________    LABS (pertinent positives/negatives) (all labs ordered are listed, but only abnormal results are displayed) Labs Reviewed  C DIFFICILE QUICK SCREEN W PCR REFLEX    LIPASE, BLOOD  COMPREHENSIVE METABOLIC PANEL  CBC  URINALYSIS, COMPLETE (UACMP) WITH MICROSCOPIC   ____________________________________________   EKG    ____________________________________________    RADIOLOGY  No results found.  ____________________________________________   PROCEDURES Procedures  ____________________________________________  DIFFERENTIAL DIAGNOSIS   UTI, dehydration, electrolyte abnormality, C. difficile colitis  CLINICAL IMPRESSION / ASSESSMENT AND PLAN / ED COURSE  Medications ordered in the ED: Medications - No data to display  Pertinent labs & imaging results that were available during my care of the patient were reviewed by me and considered in my medical decision making (see chart for details).  Mariah [name] was evaluated in Emergency Department on 03/26/2021 for the symptoms described in the history of present illness. She was evaluated in the context of the global COVID-19 pandemic, which necessitated consideration that the patient might be at risk for infection with the SARS-CoV-2 virus that causes COVID-19. Institutional protocols and algorithms that pertain to the evaluation of  patients at risk for COVID-19 are in a state of rapid change based on information released by regulatory bodies including the CDC and federal and state organizations. These policies and algorithms were followed during the patient's care in the ED.   Patient sent to the ED for fatigue.  She denies any abdominal pain.  Abdominal exam is benign and nontender.  She is nontoxic with normal vital signs.  Will obtain labs.  Unless there is severe abnormalities, I think patient will be stable for discharge and continued outpatient management.      ____________________________________________   FINAL CLINICAL IMPRESSION(S) / ED DIAGNOSES    Final diagnoses:  Fatigue, unspecified type     ED Discharge Orders     None       Portions of this note were generated with dragon dictation software. Dictation errors may occur despite best attempts at proofreading.    Carrie Mew, MD 03/26/21 1353

## 2021-03-26 NOTE — Discharge Instructions (Addendum)
Potassium was slightly low today likely from the diarrhea.  Please take the potassium supplement for the next 5 days.  Please follow-up with your primary care provider for ongoing management and a repeat

## 2021-03-27 NOTE — Telephone Encounter (Signed)
Was just seen in the ER and had some lab work

## 2021-04-01 ENCOUNTER — Other Ambulatory Visit: Payer: Self-pay

## 2021-04-01 ENCOUNTER — Other Ambulatory Visit: Payer: Medicare Other | Admitting: Student

## 2021-04-01 ENCOUNTER — Ambulatory Visit: Payer: Medicare Other | Admitting: Family Medicine

## 2021-04-01 DIAGNOSIS — R7401 Elevation of levels of liver transaminase levels: Secondary | ICD-10-CM

## 2021-04-01 DIAGNOSIS — E46 Unspecified protein-calorie malnutrition: Secondary | ICD-10-CM | POA: Diagnosis not present

## 2021-04-01 DIAGNOSIS — Z79899 Other long term (current) drug therapy: Secondary | ICD-10-CM | POA: Diagnosis not present

## 2021-04-01 DIAGNOSIS — Z515 Encounter for palliative care: Secondary | ICD-10-CM | POA: Diagnosis not present

## 2021-04-01 DIAGNOSIS — R195 Other fecal abnormalities: Secondary | ICD-10-CM | POA: Diagnosis not present

## 2021-04-01 NOTE — Progress Notes (Deleted)
Established patient visit   Patient: Mariah [name]   DOB: 1945-12-05   75 y.o. Female  MRN: [id number] Visit Date: 04/01/2021  Today's healthcare provider: Gwyneth Sprout, FNP   No chief complaint on file.  Subjective    HPI  Follow up Hospitalization  Patient was admitted to [facility] on 03/26/21 and discharged on 03/26/21. She was treated for fatigue, unspecified type. Treatment for this included none. Telephone follow up was done on none She reports {excellent/good/fair:19992} compliance with treatment. She reports this condition is {resolved/improved/worsened:23923}.  ----------------------------------------------------------------------------------------- -   {Link to patient history deactivated due to formatting error:1}  Medications: Outpatient Medications Prior to Visit  Medication Sig   albuterol (VENTOLIN HFA) 108 (90 Base) MCG/ACT inhaler INHALE 1-2 PUFFS INTO THE LUNGS EVERY 6 HOURS AS NEEDED FOR WHEEZING OR SHORTNESS OF BREATH.   betamethasone dipropionate (DIPROLENE) 0.05 % ointment Apply topically daily.   BREO ELLIPTA 100-25 MCG/INH AEPB TAKE 1 PUFF BY MOUTH EVERY DAY   calcipotriene (DOVONOX) 0.005 % cream APPLY TO SKIN ONCE A DAY APPLY DAILY ON HANDS, FEET AND WRIST X 2 WEEKS, THEN USE 5 DAYS A WEEK   Calcipotriene 0.005 % solution APPLY 1 A SMALL AMOUNT TO SCALP ONCE A DAY APPLY TO SCALP DAILY FOR 2 WEEKS, THEN USE ON SCALP 5 DAYS A WEEK   Cholecalciferol (VITAMIN D3) 2000 units TABS Take 2,000 Units by mouth daily.   clobetasol (TEMOVATE) 0.05 % external solution APPLY TO AFFECTED AREA TWICE A DAY   COSENTYX SENSOREADY, 300 MG, 150 MG/ML SOAJ Inject 2 Syringes into the skin every 28 (twenty-eight) days.   donepezil (ARICEPT) 10 MG tablet Take 10 mg by mouth daily.   ferrous sulfate 325 (65 FE) MG tablet Take 325 mg by mouth 2 (two) times daily.   folic acid (FOLVITE) 1 MG tablet Take 1 mg by mouth daily.   furosemide (LASIX) 40 MG tablet Take 1 tablet (40  mg total) by mouth daily. If weight 127 lb or less, take 20 mg by mouth daily. (Patient not taking: Reported on 02/27/2021)   gabapentin (NEURONTIN) 300 MG capsule Take 1 capsule (300 mg total) by mouth 2 (two) times daily.   levocetirizine (XYZAL) 5 MG tablet TAKE 1 TABLET BY MOUTH EVERY DAY IN THE EVENING   loperamide (IMODIUM) 2 MG capsule Take 1 capsule (2 mg total) by mouth as needed for diarrhea or loose stools.   metoprolol succinate (TOPROL-XL) 50 MG 24 hr tablet TAKE 1 TABLET BY MOUTH DAILY. TAKE WITH OR IMMEDIATELY FOLLOWING A MEAL.   pantoprazole (PROTONIX) 20 MG tablet TAKE 1 TABLET (20 MG TOTAL) BY MOUTH 2 (TWO) TIMES DAILY BEFORE A MEAL.   potassium chloride SA (KLOR-CON) 20 MEQ tablet Take 1 tablet (20 mEq total) by mouth 2 (two) times daily for 5 days.   rOPINIRole (REQUIP) 1 MG tablet TAKE 1 TABLET BY MOUTH AT BEDTIME.   traMADol (ULTRAM) 50 MG tablet TAKE 1 TABLET BY MOUTH EVERY 6 HOURS AS NEEDED FOR MODERATE OR SEVERE PAIN   No facility-administered medications prior to visit.    Review of Systems  Last CBC Lab Results  Component Value Date   WBC 4.8 03/26/2021   HGB 13.6 03/26/2021   HCT 38.4 03/26/2021   MCV 93.0 03/26/2021   MCH 32.9 03/26/2021   RDW 15.0 03/26/2021   PLT 149 (L) [id number]   Last metabolic panel Lab Results  Component Value Date   GLUCOSE 103 (H) 03/26/2021  NA 138 03/26/2021   K 3.2 (L) 03/26/2021   CL 104 03/26/2021   CO2 26 03/26/2021   BUN 8 03/26/2021   CREATININE 1.13 (H) 03/26/2021   GFRNONAA 50 (L) 03/26/2021   GFRAA 54 (L) 08/20/2020   CALCIUM 8.3 (L) 03/26/2021   PHOS 3.1 01/11/2021   PROT 5.8 (L) 03/26/2021   ALBUMIN 2.8 (L) 03/26/2021   LABGLOB 2.3 02/14/2021   AGRATIO 1.7 02/14/2021   BILITOT 0.9 03/26/2021   ALKPHOS 177 (H) 03/26/2021   AST 29 03/26/2021   ALT 18 03/26/2021   ANIONGAP 8 03/26/2021   Last lipids Lab Results  Component Value Date   CHOL 166 08/20/2020   HDL 56 08/20/2020   LDLCALC 86 08/20/2020    TRIG 141 08/20/2020   CHOLHDL 3.0 08/20/2020   Last hemoglobin A1c Lab Results  Component Value Date   HGBA1C 5.5 08/08/2019   Last thyroid functions Lab Results  Component Value Date   TSH 2.060 01/11/2021   Last vitamin D No results found for: 25OHVITD2, 25OHVITD3, VD25OH Last vitamin B12 and Folate Lab Results  Component Value Date   VITAMINB12 186 06/03/2020   FOLATE 5.8 (L) 06/03/2020       Objective    There were no vitals taken for this visit. BP Readings from Last 3 Encounters:  03/26/21 (!) 153/96  02/13/21 124/72  01/14/21 130/72   Wt Readings from Last 3 Encounters:  02/13/21 124 lb (56.2 kg)  01/14/21 125 lb 14.4 oz (57.1 kg)  11/18/20 127 lb 3.2 oz (57.7 kg)      Physical Exam  ***  No results found for any visits on 04/01/21.  Assessment & Plan     ***  No follow-ups on file.      {provider attestation***:1}   Gwyneth Sprout, [place] [phone] (phone) [phone] (fax)  [place]

## 2021-04-01 NOTE — Progress Notes (Signed)
[facility] Palliative Care Consult Note Telephone: [phone]  Fax: [phone]    Date of encounter: 04/01/21 1:41 PM PATIENT NAME: Mariah [name] [place] [address]   [phone] (home)  DOB: Sep 28, 1945 MRN: [id number] PRIMARY CARE PROVIDER:    [name], MD,  [address] [place] [address] [phone]  REFERRING PROVIDER:   [name], [place],  [place] [address] [phone]  RESPONSIBLE PARTY:    Contact Information     Name Relation Home Work Mobile   Mariah [name] Relative [phone]     Mariah [name] Niece   [phone]        I met face to face with patient and family in the home. Palliative Care was asked to follow this patient by consultation request of  Bacigalupo, Dionne Bucy, MD to address advance care planning and complex medical decision making. This is a follow up visit.                                   ASSESSMENT AND PLAN / RECOMMENDATIONS:   Advance Care Planning/Goals of Care: Goals include to maximize quality of life and symptom management.   CODE STATUS: Full Code  Symptom Management/Plan:  Transaminitis-discussed fatigue could be related to her dx. She denies any abdominal pain, swelling, N/V. Labs on 9/7-AST 29, ALT-18.   Medication adherence-we discussed the need to take medications as directed. Patient has improved with taking medications from her pill box filled by niece. She does need reminders to take her Cosentyx every 28 days.    Protein calorie malnutrition-weight loss noted over past 10 months; weight has been stable recently. Patient is eating mostly snack foods, family does encourage well balanced diet. Nutritional supplement encouraged daily.   Loose stools/diarrhea-C-diff was negative on 03/26/21. Take imodium with first loose stool in AM; if loose stools persist, take Questran 1 scoop each afternoon PRN.  Prescriptions sent for imodium and Questran.  Follow up Palliative Care Visit: Palliative care will continue to follow for complex medical decision making, advance care planning, and clarification of goals. Return in 4-6 weeks or prn.  I spent 60 minutes providing this consultation. More than 50% of the time in this consultation was spent in counseling and care coordination.   PPS: 60%  HOSPICE ELIGIBILITY/DIAGNOSIS: TBD  Chief Complaint: Palliative Medicine follow up visit.   HISTORY OF PRESENT ILLNESS:  Mariah [name] is a 75 y.o. year old female  with Transaminitis, advanced hepatic fibrosis, CKD 3, hyperlipidemia, severe protein calorie malnutrition, COPD, chronic diastolic heart failure, leukopenia, psoriatic arthritis.   Patient currently resides with her niece and family. Niece had reported patient with increased fatigue, increased episodes of loose stools and having bowel incontinence. Patient was seen in ED on 9/7. Patient denies fatigue; she states that she does not feel she is sleeping any more than she was before. She will occasionally take a nap. She does endorse occasional incontinent episode, stating she doesn't always get to bathroom on time or someone else may be in the bathroom. She has not been taking imodium; she needs medication. She also was to start [place] but needs prescription sent. Patient reports a fair appetite; she denies any recent weight loss. Patient has upcoming appointments with oral surgeon, PCP and ophthalmologist. A 10 point review of systems is negative, except for the pertinent positives and negatives  detailed in the HPI.    History obtained from review of EMR, discussion with primary team, and interview with family, facility staff/caregiver and/or [name].  I reviewed available labs, medications, imaging, studies and related documents from the EMR.  Records reviewed and summarized above.    Physical Exam: Pulse 68, resp 16, b/p 120/70, sats 99% on  room air.  Constitutional: NAD General: frail appearing, thin EYES: anicteric sclera, lids intact, no discharge  ENMT: intact hearing, oral mucous membranes moist, dentition intact CV: S1S2, RRR, no LE edema Pulmonary: LCTA, no increased work of breathing, no cough, room air Abdomen: normo-active BS + 4 quadrants, soft and non tender GU: deferred MSK: sarcopenia, moves all extremities, ambulatory Skin: warm and dry, no rashes or wounds on visible skin Neuro: generalized weakness, A & O x 3; forgetful Psych: non-anxious affect, pleasant Hem/lymph/immuno: no widespread bruising   Thank you for the opportunity to participate in the care of Mariah [name].  The palliative care team will continue to follow. Please call our office at [phone] if we can be of additional assistance.   Mariah Slocumb, NP   COVID-19 PATIENT SCREENING TOOL Asked and negative response unless otherwise noted:   Have you had symptoms of covid, tested positive or been in contact with someone with symptoms/positive test in the past 5-10 days? No

## 2021-04-04 ENCOUNTER — Ambulatory Visit (INDEPENDENT_AMBULATORY_CARE_PROVIDER_SITE_OTHER)

## 2021-04-04 ENCOUNTER — Other Ambulatory Visit: Payer: Self-pay | Admitting: Family Medicine

## 2021-04-04 DIAGNOSIS — R197 Diarrhea, unspecified: Secondary | ICD-10-CM

## 2021-04-04 DIAGNOSIS — J449 Chronic obstructive pulmonary disease, unspecified: Secondary | ICD-10-CM

## 2021-04-04 DIAGNOSIS — R413 Other amnesia: Secondary | ICD-10-CM

## 2021-04-04 NOTE — Chronic Care Management (AMB) (Signed)
Chronic Care Management   CCM RN Visit Note  04/04/2021 Name: Mariah [name] MRN: [id number] DOB: 06-11-1946  Subjective: Mariah [name] is a 75 y.o. year old female who is a primary care patient of Bacigalupo, Dionne Bucy, MD. The care management team was consulted for assistance with disease management and care coordination needs.    Engaged with patient's niece/caregiver Detrice by telephone in response to provider referral for case management and care coordination services.   Consent to Services:  The patient was given information about Chronic Care Management services, agreed to services, and gave verbal consent prior to initiation of services.  Please see initial visit note for detailed documentation.    Assessment: Review of patient past medical history, allergies, medications, health status, including review of consultants reports, laboratory and other test data, was performed as part of comprehensive evaluation and provision of chronic care management services.   SDOH (Social Determinants of Health) assessments and interventions performed:  No  CCM Care Plan  Allergies  Allergen Reactions   Amoxicillin Diarrhea   Clindamycin/Lincomycin     diarrhea    Outpatient Encounter Medications as of 04/04/2021  Medication Sig   albuterol (VENTOLIN HFA) 108 (90 Base) MCG/ACT inhaler INHALE 1-2 PUFFS INTO THE LUNGS EVERY 6 HOURS AS NEEDED FOR WHEEZING OR SHORTNESS OF BREATH.   betamethasone dipropionate (DIPROLENE) 0.05 % ointment Apply topically daily.   BREO ELLIPTA 100-25 MCG/INH AEPB TAKE 1 PUFF BY MOUTH EVERY DAY   calcipotriene (DOVONOX) 0.005 % cream APPLY TO SKIN ONCE A DAY APPLY DAILY ON HANDS, FEET AND WRIST X 2 WEEKS, THEN USE 5 DAYS A WEEK   Calcipotriene 0.005 % solution APPLY 1 A SMALL AMOUNT TO SCALP ONCE A DAY APPLY TO SCALP DAILY FOR 2 WEEKS, THEN USE ON SCALP 5 DAYS A WEEK   Cholecalciferol (VITAMIN D3) 2000 units TABS Take 2,000 Units by mouth daily.   clobetasol (TEMOVATE)  0.05 % external solution APPLY TO AFFECTED AREA TWICE A DAY   COSENTYX SENSOREADY, 300 MG, 150 MG/ML SOAJ Inject 2 Syringes into the skin every 28 (twenty-eight) days.   donepezil (ARICEPT) 10 MG tablet Take 10 mg by mouth daily.   ferrous sulfate 325 (65 FE) MG tablet Take 325 mg by mouth 2 (two) times daily.   folic acid (FOLVITE) 1 MG tablet Take 1 mg by mouth daily.   furosemide (LASIX) 40 MG tablet Take 1 tablet (40 mg total) by mouth daily. If weight 127 lb or less, take 20 mg by mouth daily. (Patient not taking: Reported on 02/27/2021)   gabapentin (NEURONTIN) 300 MG capsule Take 1 capsule (300 mg total) by mouth 2 (two) times daily.   levocetirizine (XYZAL) 5 MG tablet TAKE 1 TABLET BY MOUTH EVERY DAY IN THE EVENING   loperamide (IMODIUM) 2 MG capsule Take 1 capsule (2 mg total) by mouth as needed for diarrhea or loose stools.   metoprolol succinate (TOPROL-XL) 50 MG 24 hr tablet TAKE 1 TABLET BY MOUTH DAILY. TAKE WITH OR IMMEDIATELY FOLLOWING A MEAL.   pantoprazole (PROTONIX) 20 MG tablet TAKE 1 TABLET (20 MG TOTAL) BY MOUTH 2 (TWO) TIMES DAILY BEFORE A MEAL.   potassium chloride SA (KLOR-CON) 20 MEQ tablet Take 1 tablet (20 mEq total) by mouth 2 (two) times daily for 5 days.   rOPINIRole (REQUIP) 1 MG tablet TAKE 1 TABLET BY MOUTH AT BEDTIME.   traMADol (ULTRAM) 50 MG tablet TAKE 1 TABLET BY MOUTH EVERY 6 HOURS AS NEEDED FOR MODERATE  OR SEVERE PAIN   No facility-administered encounter medications on file as of 04/04/2021.    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[place]) 07/09/2020   Congestive heart failure ([place]) 06/18/2020   Weakness    Protein-calorie malnutrition, severe 06/05/2020   Hypoalbuminemia 06/03/2020   Cutaneous horn 06/03/2020   Pleural effusion 06/03/2020   Chronic diastolic CHF (congestive heart failure) ([place]) 06/03/2020   Acute respiratory failure with hypoxia ([place]) 06/02/2020   Irregular heartbeat 05/30/2020   Bilateral edema of lower extremity 05/30/2020   Blood transfusion reaction 05/30/2020   CKD (chronic kidney disease), stage III ([place]) 05/26/2020   Acute deep vein thrombosis (DVT) of tibial vein of left lower extremity ([place]) 04/05/2020   Mental confusion 04/01/2020   Unintentional weight loss 03/01/2020   Memory loss 03/01/2020   Allergic rhinitis 01/10/2018   Cholelithiasis 03/16/2016   Peripheral arterial disease ([place]) 02/05/2016   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   Abdominal aortic aneurysm (AAA) ([place]) 01/13/2016   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   Nerve root inflammation 10/25/2015   Psoriatic arthritis ([place]) 10/07/2015   Degenerative arthritis of hip 10/07/2015   Right hip pain 08/12/2015   Arthralgia of multiple joints 08/12/2015   Age related osteoporosis 08/12/2015   COPD with acute exacerbation ([place]) 06/24/2015   Senile purpura ([place]) 06/24/2015   Abnormal serum level of alkaline phosphatase 06/18/2015   Lethargy 06/18/2015   Gastro-esophageal reflux disease without esophagitis 06/18/2015   Restless leg 06/18/2015   Avitaminosis D 06/18/2015   Mechanical and motor problems with internal organs 10/11/2009   Hyperlipidemia 10/11/2009   Arthritis, degenerative 09/25/2009   Essential (primary) hypertension 09/25/2009   Psoriasis 09/25/2009   Compulsive tobacco user syndrome 09/25/2009    Conditions to be addressed/monitored:Dementia, COPD, Functional Decline and pending transition to Hospice  Thorough discussion with Mariah [name]'s daughter/caregiver Koreen regarding care management needs.  Mariah [name] reports that Mariah [name]'s cognitive and functional abilities have significantly declined. Declined need for additional CCM LCSW and Nursing service due to likely transition to hospice. She confirmed completing outreach with the Palliative Care team earlier this week. Reports the family has discussed need to transition to Hospice due to patient's decline. Anticipates following up with the assigned Palliative Care provider within the next week regarding plan for transition. She will continue to ensure medications are administered as prescribed until advised otherwise. Agreed to keep the CCM Pharmacist and team updated of needs and changes.   PLAN Ms. Fremin caregiver/niece Hollan will call if additional outreach is required. The care management team will gladly assist.    [name] Care Management [facility] [phone]

## 2021-04-05 ENCOUNTER — Other Ambulatory Visit: Payer: Self-pay | Admitting: Family Medicine

## 2021-04-05 NOTE — Telephone Encounter (Signed)
Requested Prescriptions  Pending Prescriptions Disp Refills  . rOPINIRole (REQUIP) 1 MG tablet [Pharmacy Med Name: ROPINIROLE HCL 1 MG TABLET] 90 tablet 0    Sig: TAKE 1 TABLET BY MOUTH EVERYDAY AT BEDTIME     Neurology:  Parkinsonian Agents Failed - 04/04/2021  4:33 PM      Failed - Last BP in normal range    BP Readings from Last 1 Encounters:  03/26/21 (!) 153/96         Passed - Valid encounter within last 12 months    Recent Outpatient Visits          1 month ago [place] Bacigalupo, Dionne Bucy, MD   4 months ago Encounter for annual wellness visit (AWV) in Medicare patient   [place], Dionne Bucy, MD   5 months ago Syncope, unspecified syncope type   [facility], Dionne Bucy, MD   7 months ago Essential (primary) hypertension   [facility], Dionne Bucy, MD   8 months ago Acute respiratory failure with hypoxia [facility])   [place], Dionne Bucy, MD      Future Appointments            In 1 month Bacigalupo, Dionne Bucy, MD [facility], [place]

## 2021-04-06 NOTE — Telephone Encounter (Signed)
dc'd 01/14/21 stop taking @ D/[facility]aniel [place] Silos MD Called [facility] med remove

## 2021-04-08 ENCOUNTER — Telehealth: Payer: Self-pay

## 2021-04-08 NOTE — Telephone Encounter (Signed)
Received message to call patient's niece. Returned call, left VM

## 2021-04-08 NOTE — Telephone Encounter (Signed)
Placed call to alternate number. Unable to leave VM

## 2021-04-09 ENCOUNTER — Telehealth: Payer: Self-pay

## 2021-04-09 ENCOUNTER — Ambulatory Visit: Payer: Self-pay

## 2021-04-09 DIAGNOSIS — J449 Chronic obstructive pulmonary disease, unspecified: Secondary | ICD-10-CM

## 2021-04-09 DIAGNOSIS — R413 Other amnesia: Secondary | ICD-10-CM

## 2021-04-09 NOTE — Telephone Encounter (Signed)
Copied from [place] [id number]. Topic: General - Other >> Apr 09, 2021 11:59 AM Yvette Rack wrote: Reason for CRM: Melissa with [facility] stated they received a referral and she requests a call back for assistance with Hospice documentation. Cb# [phone]

## 2021-04-09 NOTE — Chronic Care Management (AMB) (Signed)
  Chronic Care Management   CCM RN Visit Note  04/09/2021 Name: Mariah [name] MRN: [id number] DOB: [birth year]   Mariah [name] is a 75 y.o. year old female who is a primary care patient of Bacigalupo, Dionne Bucy, MD. The care management team was previously consulted for assistance with disease management and care coordination needs.    Received call from patient's niece/caregiver Mariah [name] regarding her aunt's rapid decline and transition to hospice services. Reports she has been unable to maintain contact with the palliative care team. She is requesting an evaluation this week.   [name]. Per Intake Coordinator, the family will be contacted today. The hospice team will ensure the needed documents are forwarded to the clinic for provider signatures. They anticipate completing the initial intake/assessment this week.   PLAN: Patient's caregiver/niece Mariah [name] will contact the care management team if additional assistance is required.   [name] Care Management [facility] [phone]

## 2021-04-09 NOTE — Telephone Encounter (Signed)
Received message to return call to Joesph July, patient's niece. TC placed unable to leave message as mailbox is full.

## 2021-04-09 NOTE — Telephone Encounter (Signed)
Phone call placed to alternate contact, Rayvon. Spoke with Rayvon and inquired about Ms.Gayheart. Per Rayvon, patient is staying with her niece Katarina Riebe. Updated Rayvon that this RN attempted to contact her yesterday and today but VM is full.

## 2021-04-09 NOTE — Telephone Encounter (Signed)
Phone call placed to patient's number. Mailbox is full.

## 2021-04-09 NOTE — Telephone Encounter (Signed)
  Melissa from [facility] states they need the form that was faxed today faxed back with providers signature also checked to remain the attending provider.  Faythe Ghee for Elizabeth to sign in Dr. Sharmaine Base place.  They also need a copy of the H&P, Med list and latest labs faxed as well.  Lenna Sciara states they should have her admitted to hospice by Friday.     Fax:  [phone]    Thanks,   -[name]

## 2021-04-10 ENCOUNTER — Other Ambulatory Visit: Payer: Medicare Other | Admitting: Student

## 2021-04-10 DIAGNOSIS — R7401 Elevation of levels of liver transaminase levels: Secondary | ICD-10-CM

## 2021-04-10 DIAGNOSIS — K7402 Hepatic fibrosis, advanced fibrosis: Secondary | ICD-10-CM | POA: Diagnosis not present

## 2021-04-10 DIAGNOSIS — Z515 Encounter for palliative care: Secondary | ICD-10-CM | POA: Diagnosis not present

## 2021-04-10 DIAGNOSIS — E43 Unspecified severe protein-calorie malnutrition: Secondary | ICD-10-CM | POA: Diagnosis not present

## 2021-04-10 DIAGNOSIS — R531 Weakness: Secondary | ICD-10-CM

## 2021-04-10 NOTE — Telephone Encounter (Signed)
Hospice nurse personally came into office and handed Daneil Dan form to have signed. KW

## 2021-04-11 ENCOUNTER — Other Ambulatory Visit: Payer: Self-pay

## 2021-04-11 ENCOUNTER — Telehealth: Payer: Self-pay | Admitting: Family Medicine

## 2021-04-11 ENCOUNTER — Ambulatory Visit: Payer: Self-pay

## 2021-04-11 DIAGNOSIS — Z515 Encounter for palliative care: Secondary | ICD-10-CM

## 2021-04-11 DIAGNOSIS — J449 Chronic obstructive pulmonary disease, unspecified: Secondary | ICD-10-CM

## 2021-04-11 NOTE — Telephone Encounter (Signed)
Mariah [name], from [facility], calling stating that she would like to have the pt evaluated for hospice care. She is requesting to know if PCP will service as  hospice attending.  Please advise.

## 2021-04-11 NOTE — Progress Notes (Signed)
[place] Consult Note Telephone: [phone]  Fax: [phone]    Date of encounter: 04/10/2021  PATIENT NAME: [name] [place] [address]   [phone] (home)  DOB: Jun 29, 1946 MRN: [id number] PRIMARY CARE PROVIDER:    [name], MD,  [address] [place] [address] [phone]  REFERRING PROVIDER:   [name], [address] [place],  [place] [address] [phone]  RESPONSIBLE PARTY:    Contact Information     Name Relation Home Work Mobile   Smiley Relative Russell Niece   [phone]         Due to the COVID-19 crisis, this visit was done via telemedicine from my office and it was initiated and consent by this patient and or family.  I connected with  Mariah [name] and niece on 04/10/2021 by a video enabled telemedicine application and verified that I am speaking with the correct person using two identifiers.   I discussed the limitations of evaluation and management by telemedicine. The patient expressed understanding and agreed to proceed.                                   ASSESSMENT AND PLAN / RECOMMENDATIONS:   Advance Care Planning/Goals of Care: Goals include to maximize quality of life and symptom management. Our advance care planning conversation included a discussion about:    The value and importance of advance care planning  Experiences with loved ones who have been seriously ill or have died  Exploration of personal, cultural or spiritual beliefs that might influence medical decisions  Exploration of goals of care in the event of a sudden injury or illness  Decision not to resuscitate or to de-escalate disease focused treatments due to poor prognosis. CODE STATUS: Full code  Education provided on palliative medicine versus hospice services. Patient is agreeable to hospice services.  Will  discuss with hospice medical director regarding proceeding with evaluation. We also discussed her CODE STATUS, currently patient is a full code. Patient states that she will have to think about Full code vs. DNR. She does state that she would like to be managed in the home, avoid hospitalization if possible.   Symptom Management/Plan:   Protein calorie malnutrition- patient continues with poor appetite. Her weight is 118 pounds today. Patient encouraged to eat foods she enjoys. Continue nutritional supplements.   Transaminitis, advanced hepatic fibrosis- patient with functional decline, sleeping more. Given her recent changes, we discussed referral for hospice evaluation. Patient is in agreement.   Generalized weakness-secondary to advanced hepatic fibrosis, COPD, diastolic heart failure. Continue assisting with adl's. Encourage use of assistive device as needed. Monitor for falls/safety.   Follow up Palliative Care Visit: Palliative care will continue to follow for complex medical decision making, advance care planning, and clarification of goals. Return in 2 weeks or prn.  I spent 60 minutes providing this consultation. More than 50% of the time in this consultation was spent in counseling and care coordination.   PPS: 40%  HOSPICE ELIGIBILITY/DIAGNOSIS: TBD  Chief Complaint: Palliative Medicine follow up visit.   HISTORY OF PRESENT ILLNESS:  Mariah [name] is a 75 y.o. year old female  with Transaminitis, advanced hepatic fibrosis, CKD 3, hyperlipidemia, severe protein calorie malnutrition, COPD, chronic diastolic heart failure, leukopenia, psoriatic arthritis.   Telehealth visit today  due to niece reporting changes since last palliative visit. Patient having functional and cognitive decline in the past week. She is now requiring assistance now with all adl's. She is unable to bath/shower herself. She is sleeping more in the past 3 weeks, now sleeping around 16 hours a day. Recent ED visit  on 03/26/21 d/t fatigue, weakness. She has had a weight loss over the past 10 months. She went from a size 2xl to a large. Niece weighed patient during visit today; she is 118 pounds. Her appetite is poor; she is eating less than 25% of meals. She is having increased forgetfulness, confusion; requires much cueing with taking medications. Niece states she has still not taken her Cosentyx. She is now having increased episodes of bowel incontinence; worsening diarrhea/loose stools. She is having increased shortness of breath, LE edema. Her PPS was 60% 2 weeks ago, now down to a 40%.  A 10-point review of systems is negative, except for the pertinent positives and negatives detailed in the HPI.   Patient observed via video. She does have increased edema to LE, left > right. Patient is frail, thin in appearance. She does respond to questions appropriately.   History obtained from review of EMR, discussion with primary team, and interview with family, facility staff/caregiver and/or Mariah [name].  I reviewed available labs, medications, imaging, studies and related documents from the EMR.  Records reviewed and summarized above.    Physical Exam: PE deferred due to this being a Tele health visit.    Thank you for the opportunity to participate in the care of Mariah [name].  The palliative care team will continue to follow. Please call our office at [phone] if we can be of additional assistance.   Ezekiel Slocumb, NP   COVID-19 PATIENT SCREENING TOOL Asked and negative response unless otherwise noted:   Have you had symptoms of covid, tested positive or been in contact with someone with symptoms/positive test in the past 5-10 days? No

## 2021-04-14 NOTE — Chronic Care Management (AMB) (Signed)
Chronic Care Management   CCM RN Visit Note   Name: Mariah [name] MRN: [id number] DOB: Nov 12, 1945  Subjective: Mariah [name] is a 75 y.o. year old female who is a primary care patient of Bacigalupo, Dionne Bucy, MD. The care management team was consulted for assistance with disease management and care coordination needs.    Engaged with patient's caregiver/niece Mariah [name] by telephone for follow up visit regarding transition to Hospice.  Consent to Services:  The patient was given information about Chronic Care Management services, agreed to services, and gave verbal consent prior to initiation of services.  Please see initial visit note for detailed documentation.    Assessment: Review of patient past medical history, allergies, medications, health status, including review of consultants reports, laboratory and other test data, was performed as part of comprehensive evaluation and provision of chronic care management services.   SDOH (Social Determinants of Health) assessments and interventions performed:  No  CCM Care Plan  Allergies  Allergen Reactions   Amoxicillin Diarrhea   Clindamycin/Lincomycin     diarrhea    Outpatient Encounter Medications as of 04/11/2021  Medication Sig   albuterol (VENTOLIN HFA) 108 (90 Base) MCG/ACT inhaler INHALE 1-2 PUFFS INTO THE LUNGS EVERY 6 HOURS AS NEEDED FOR WHEEZING OR SHORTNESS OF BREATH.   betamethasone dipropionate (DIPROLENE) 0.05 % ointment Apply topically daily.   BREO ELLIPTA 100-25 MCG/INH AEPB TAKE 1 PUFF BY MOUTH EVERY DAY   calcipotriene (DOVONOX) 0.005 % cream APPLY TO SKIN ONCE A DAY APPLY DAILY ON HANDS, FEET AND WRIST X 2 WEEKS, THEN USE 5 DAYS A WEEK   Calcipotriene 0.005 % solution APPLY 1 A SMALL AMOUNT TO SCALP ONCE A DAY APPLY TO SCALP DAILY FOR 2 WEEKS, THEN USE ON SCALP 5 DAYS A WEEK   Cholecalciferol (VITAMIN D3) 2000 units TABS Take 2,000 Units by mouth daily.   clobetasol (TEMOVATE) 0.05 % external solution APPLY TO AFFECTED  AREA TWICE A DAY   COSENTYX SENSOREADY, 300 MG, 150 MG/ML SOAJ Inject 2 Syringes into the skin every 28 (twenty-eight) days.   donepezil (ARICEPT) 10 MG tablet Take 10 mg by mouth daily.   ferrous sulfate 325 (65 FE) MG tablet Take 325 mg by mouth 2 (two) times daily.   folic acid (FOLVITE) 1 MG tablet Take 1 mg by mouth daily.   furosemide (LASIX) 40 MG tablet Take 1 tablet (40 mg total) by mouth daily. If weight 127 lb or less, take 20 mg by mouth daily. (Patient not taking: Reported on 02/27/2021)   gabapentin (NEURONTIN) 300 MG capsule Take 1 capsule (300 mg total) by mouth 2 (two) times daily.   levocetirizine (XYZAL) 5 MG tablet TAKE 1 TABLET BY MOUTH EVERY DAY IN THE EVENING   loperamide (IMODIUM) 2 MG capsule Take 1 capsule (2 mg total) by mouth as needed for diarrhea or loose stools.   metoprolol succinate (TOPROL-XL) 50 MG 24 hr tablet TAKE 1 TABLET BY MOUTH DAILY. TAKE WITH OR IMMEDIATELY FOLLOWING A MEAL.   pantoprazole (PROTONIX) 20 MG tablet TAKE 1 TABLET (20 MG TOTAL) BY MOUTH 2 (TWO) TIMES DAILY BEFORE A MEAL.   rOPINIRole (REQUIP) 1 MG tablet TAKE 1 TABLET BY MOUTH EVERYDAY AT BEDTIME   traMADol (ULTRAM) 50 MG tablet TAKE 1 TABLET BY MOUTH EVERY 6 HOURS AS NEEDED FOR MODERATE OR SEVERE PAIN   No facility-administered encounter medications on file as of 04/11/2021.    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   Congestive heart failure ([place]) 06/18/2020   Weakness    Protein-calorie malnutrition, severe 06/05/2020   Hypoalbuminemia  06/03/2020   Cutaneous horn 06/03/2020   Pleural effusion 06/03/2020   Chronic diastolic CHF (congestive heart failure) ([place]) 06/03/2020   Acute respiratory failure with hypoxia (HCC) 06/02/2020   Irregular heartbeat 05/30/2020   Bilateral edema of lower extremity 05/30/2020   Blood transfusion reaction 05/30/2020   CKD (chronic kidney disease), stage III ([place]) 05/26/2020   Acute deep vein thrombosis (DVT) of tibial vein of left lower extremity ([place]) 04/05/2020   Mental confusion 04/01/2020   Unintentional weight loss 03/01/2020   Memory loss 03/01/2020   Allergic rhinitis 01/10/2018   Cholelithiasis 03/16/2016   Peripheral arterial disease ([place]) 02/05/2016   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   Abdominal aortic aneurysm (AAA) ([place]) 01/13/2016   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   Nerve root inflammation 10/25/2015   Psoriatic arthritis ([place]) 10/07/2015   Degenerative arthritis of hip 10/07/2015   Right hip pain 08/12/2015   Arthralgia of multiple joints 08/12/2015   Age related osteoporosis 08/12/2015   COPD with acute exacerbation ([place]) 06/24/2015   Senile purpura ([place]) 06/24/2015   Abnormal serum level of alkaline phosphatase 06/18/2015   Lethargy 06/18/2015   Gastro-esophageal reflux disease without esophagitis 06/18/2015   Restless leg 06/18/2015   Avitaminosis D 06/18/2015   Mechanical and motor problems with internal organs 10/11/2009   Hyperlipidemia 10/11/2009   Arthritis, degenerative 09/25/2009   Essential (primary) hypertension 09/25/2009   Psoriasis 09/25/2009   Compulsive tobacco user syndrome 09/25/2009    Conditions to be addressed/monitored: Transition to Hospice  [name]'s caregiver/niece Mariah [name] confirmed that she has successful transitioned to services with [facility]. Her initial evaluation was completed today. She will receive nursing and nursing aide services twice a week. Confirmed that a Education officer, museum and chaplain have also been assigned. Mariah [name]  agreed to contact the clinic or call the care management team with changes or if any further assistance is required.    PLAN Mrs. Tamura niece/caregiver Mariah [name] will call if additional assistance is required.   [name] [phone]

## 2021-04-14 NOTE — Telephone Encounter (Signed)
Left message for Mariah [name] advising as below.

## 2021-04-14 NOTE — Telephone Encounter (Signed)
I agree that she is good for hospice eval and will serve as hospice attending

## 2021-04-18 DIAGNOSIS — J449 Chronic obstructive pulmonary disease, unspecified: Secondary | ICD-10-CM

## 2021-05-17 ENCOUNTER — Other Ambulatory Visit: Payer: Self-pay | Admitting: Family Medicine

## 2021-05-17 NOTE — Telephone Encounter (Signed)
Requested Prescriptions  Pending Prescriptions Disp Refills  . gabapentin (NEURONTIN) 300 MG capsule [Pharmacy Med Name: GABAPENTIN 300 MG CAPSULE] 60 capsule 0    Sig: TAKE 1 CAPSULE BY MOUTH TWICE A DAY     Neurology: Anticonvulsants - gabapentin Passed - 05/17/2021 11:23 AM      Passed - Valid encounter within last 12 months    Recent Outpatient Visits          3 months ago [place] Bacigalupo, Dionne Bucy, MD   6 months ago Encounter for annual wellness visit (AWV) in Medicare patient   [place], MD   7 months ago Syncope, unspecified syncope type   [facility], Dionne Bucy, MD   9 months ago Essential (primary) hypertension   [facility], Dionne Bucy, MD   9 months ago Acute respiratory failure with hypoxia [facility])   [place], Dionne Bucy, MD      Future Appointments            In 5 days Bacigalupo, Dionne Bucy, MD [facility] [place]

## 2021-05-22 ENCOUNTER — Ambulatory Visit: Payer: PPO | Admitting: Family Medicine

## 2021-05-27 DIAGNOSIS — H903 Sensorineural hearing loss, bilateral: Secondary | ICD-10-CM | POA: Diagnosis not present

## 2021-07-31 DIAGNOSIS — R404 Transient alteration of awareness: Secondary | ICD-10-CM | POA: Diagnosis not present

## 2021-08-08 ENCOUNTER — Telehealth: Payer: Self-pay

## 2021-08-08 ENCOUNTER — Emergency Department
Admission: EM | Admit: 2021-08-08 | Discharge: 2021-08-08 | Disposition: A | Discharge status: Home / Self Care | Payer: Medicare Other | Attending: Emergency Medicine | Admitting: Emergency Medicine

## 2021-08-08 ENCOUNTER — Other Ambulatory Visit: Payer: Self-pay

## 2021-08-08 ENCOUNTER — Ambulatory Visit: Payer: Self-pay | Admitting: *Deleted

## 2021-08-08 DIAGNOSIS — J441 Chronic obstructive pulmonary disease with (acute) exacerbation: Secondary | ICD-10-CM

## 2021-08-08 DIAGNOSIS — N390 Urinary tract infection, site not specified: Secondary | ICD-10-CM | POA: Diagnosis not present

## 2021-08-08 DIAGNOSIS — Z466 Encounter for fitting and adjustment of urinary device: Secondary | ICD-10-CM | POA: Insufficient documentation

## 2021-08-08 DIAGNOSIS — I5032 Chronic diastolic (congestive) heart failure: Secondary | ICD-10-CM

## 2021-08-08 LAB — URINALYSIS, ROUTINE W REFLEX MICROSCOPIC
Bilirubin Urine: NEGATIVE
Glucose, UA: NEGATIVE mg/dL
Ketones, ur: NEGATIVE mg/dL
Nitrite: POSITIVE — AB
Protein, ur: >=300 mg/dL — AB
Specific Gravity, Urine: 1.014 (ref 1.005–1.030)
Squamous Epithelial / HPF: NONE SEEN (ref 0–5)
pH: 9 — ABNORMAL HIGH (ref 5.0–8.0)

## 2021-08-08 MED ORDER — CEPHALEXIN 500 MG PO CAPS: 500.0000 mg | ORAL_CAPSULE | Freq: Two times a day (BID) | ORAL | 0 refills | Status: AC

## 2021-08-08 NOTE — ED Provider Notes (Signed)
[facility] Provider Note    Event Date/Time   First MD Initiated Contact with Patient 08/08/21 1621     (approximate)   History   Foley Catheter Removal   HPI  Mariah [name] is a 76 y.o. female presents for Foley catheter removal.  The patient had been bedbound and had a Foley catheter placed about 1 to 2 weeks ago.  She states that she is now more mobile and needs it removed, but was not able to get an outpatient appointment for several weeks.  She denies dysuria, hematuria, abdominal pain, fever, or other acute symptoms.    Physical Exam   Triage Vital Signs: ED Triage Vitals [08/08/21 1313]  Enc Vitals Group     BP 119/78     Pulse Rate 84     Resp 16     Temp 98.7 F (37.1 C)     Temp Source Oral     SpO2 95 %     Weight      Height      Head Circumference      Peak Flow      Pain Score      Pain Loc      Pain Edu?      Excl. in [place]?     Most recent vital signs: Vitals:   08/08/21 1320 08/08/21 1635  BP: 119/78 (!) 134/95  Pulse: 84 71  Resp: 16 20  Temp: 98.7 F (37.1 C)   SpO2: 95% 98%     General: Awake, no distress.  CV:  Good peripheral perfusion.  Resp:  Normal effort.  Abd:  No distention.     ED Results / Procedures / Treatments   Labs (all labs ordered are listed, but only abnormal results are displayed) Labs Reviewed  URINALYSIS, ROUTINE W REFLEX MICROSCOPIC - Abnormal; Notable for the following components:      Result Value   Color, Urine YELLOW (*)    APPearance TURBID (*)    pH 9.0 (*)    Hgb urine dipstick SMALL (*)    Protein, ur >=300 (*)    Nitrite POSITIVE (*)    Leukocytes,Ua MODERATE (*)    Bacteria, UA MANY (*)    All other components within normal limits     EKG     RADIOLOGY   PROCEDURES:  Critical Care performed: No  Procedures   MEDICATIONS ORDERED IN ED: Medications - No data to display   IMPRESSION / MDM / [place] / ED COURSE  I reviewed the triage  vital signs and the nursing notes.  76 year old female with multiple medical problems presents for Foley catheter removal.  The catheter has been in for 1 to 2 weeks.  She denies any acute symptoms.  The catheter was removed by the RN prior to me evaluating the patient.  On my exam she is well-appearing and her vital signs are normal.  Physical exam is otherwise unremarkable.  The patient states that she has been able to urinate a small amount since the catheter was removed.  Urinalysis was obtained; it shows many bacteria and is nitrite positive although no WBCs.  This is borderline for UTI.  It could represent colonization, however since the Foley has only been in a short time, it could represent a true UTI.  I discussed this with the patient and her family member, they would prefer to proceed with empiric treatment.  I have prescribed Keflex.  At this time,  the patient is stable for discharge home.  There is no evidence of ongoing urinary retention.  I gave her thorough return precautions and she expressed understanding.    FINAL CLINICAL IMPRESSION(S) / ED DIAGNOSES   Final diagnoses:  Encounter for Foley catheter removal  Urinary tract infection without hematuria, site unspecified     Rx / DC Orders   ED Discharge Orders          Ordered    cephALEXin (KEFLEX) 500 MG capsule  2 times daily        08/08/21 1649             Note:  This document was prepared using Dragon voice recognition software and may include unintentional dictation errors.    Arta Silence, MD 08/08/21 1719

## 2021-08-08 NOTE — ED Notes (Signed)
Pt is cao x 4 Foley catheter already removed. Pt wants to know about leaving and this process

## 2021-08-08 NOTE — Telephone Encounter (Signed)
Jasmine with [facility] called and stated that she would like to know if Dr B can send in an order for hospice and be attending. Please advise

## 2021-08-08 NOTE — ED Triage Notes (Signed)
Pt states she had been bedbound and is better now and off of hospice care and is needed her foley catheter removed, states they went to urgent care but they would not remove it and not able to get an appt with PCP for a couple of weeks. Pt is a/ox4 on arrival

## 2021-08-08 NOTE — Discharge Instructions (Addendum)
Take the antibiotic as prescribed and finish the full course.  Return to the ER if you have recurrent urinary retention, other difficulty urinating, pain, fever, or any other new or worsening symptoms that concern you.

## 2021-08-08 NOTE — Telephone Encounter (Signed)
Reason for Disposition  [1] Caller requesting NON-URGENT health information AND [2] PCP's office is the best resource  Answer Assessment - Initial Assessment Questions 1. REASON FOR CALL or QUESTION: "What is your reason for calling today?" or "How can I best help you?" or "What question do you have that I can help answer?"     Can office remove urinary catheter? Patient was moved to [facility] respite care for 1 week and medications were discontinued and patient got better. Patient has rebounded and returned to home care. Family is working to get patient place in assisted living. Patient had catheter placed due to bedridden state and it was not removed when patient was released home. Patient is able to move around and go to bathroom herself. Patient needs the catheter removed. Call to office- choices are make appointment, await new hospice care for patient. Patient son-in -law advised  and he will await call back. He is aware he can take patient to UC/ED for removal and that may be what he has to do to have the catheter removed today. Please call him and let him kno about the new referral.  Protocols used: Information Only Call - No Triage-A-AH

## 2021-08-08 NOTE — Telephone Encounter (Signed)
Copied from [place] [id number]. Topic: Referral - Status >> Aug 08, 2021  9:51 AM Yvette Rack wrote: Reason for CRM: Pt POA Rayvon Richardson Landry stated pt is no longer dealing with [facility] and wanted to make Dr. B aware that another [place] will be contacting her for a referral.

## 2021-08-08 NOTE — ED Notes (Signed)
16 fr foley catheter removed intact.  Urine sample removed from bladder prior to discontinue.  Patient tolerated well.

## 2021-08-11 ENCOUNTER — Telehealth: Payer: Self-pay | Admitting: Family Medicine

## 2021-08-11 NOTE — Telephone Encounter (Signed)
Please advise 

## 2021-08-11 NOTE — Telephone Encounter (Signed)
Spoke with [place].  She states she will need an order faxed to [phone]   Thanks,   -[name]

## 2021-08-11 NOTE — Telephone Encounter (Signed)
OK for verbals. Yes. Will stay attending

## 2021-08-11 NOTE — Telephone Encounter (Signed)
Copied from [place] [id number]. Topic: Quick Communication - Home Health Verbal Orders >> Aug 11, 2021  8:46 AM Leward Quan A wrote: Caller/Agency: Delana Meyer [facility] Number: [phone] Requesting OT/PT/Skilled Nursing/Social Work/Speech Therapy: Hospice care  Frequency:  Would like to know if Dr B would stay being the attending physician while patient receives care

## 2021-08-11 NOTE — Telephone Encounter (Signed)
Rx written.

## 2021-08-11 NOTE — Telephone Encounter (Signed)
Order has been faxed to the number below.   Thanks,   -[name]

## 2021-08-19 ENCOUNTER — Ambulatory Visit (INDEPENDENT_AMBULATORY_CARE_PROVIDER_SITE_OTHER): Payer: Medicare Other | Admitting: Family Medicine

## 2021-08-19 ENCOUNTER — Telehealth: Payer: Self-pay

## 2021-08-19 ENCOUNTER — Encounter: Payer: Self-pay | Admitting: Family Medicine

## 2021-08-19 ENCOUNTER — Other Ambulatory Visit: Payer: Self-pay

## 2021-08-19 VITALS — BP 149/82 | HR 73 | Temp 98.0°F | Ht 61.0 in | Wt 117.7 lb

## 2021-08-19 DIAGNOSIS — Z23 Encounter for immunization: Secondary | ICD-10-CM

## 2021-08-19 DIAGNOSIS — J439 Emphysema, unspecified: Secondary | ICD-10-CM

## 2021-08-19 DIAGNOSIS — Z022 Encounter for examination for admission to residential institution: Secondary | ICD-10-CM | POA: Diagnosis not present

## 2021-08-19 NOTE — Progress Notes (Signed)
° °  SUBJECTIVE:   CHIEF COMPLAINT / HPI:   Assisted living care encounter - functional and cognitive decline, generalized weakness 2/2 advanced hepatic fibrosis. Also with protein calorie malnutrition. Now requiring assistance with some ADLs. - currently living with niece. Was having hospice come in but was increasing pain medication, morphine q4h to the point she was bedridden. Catheter placed. Family transferred her to [facility], was off all meds with subsequent improvement in cognition, alertness. - recent ED visit 1/20, removed Foley, gave antibiotic for presumed UTI. Denies dysuria, foul odor, hematuria. Catheter removed. Finished antibiotic. - family planning to place patient at [facility] assisted living. Needs FL2 form completed, updated meds, shots.  - feels pain is controlled with oxycodone, only takes if absolutely needed.  - has not taken any meds today.   OBJECTIVE:   BP (!) 149/82 (BP Location: Left Arm, Patient Position: Sitting, Cuff Size: Small)    Pulse 73    Temp 98 F (36.7 C) (Temporal)    Ht 5\' 1"  (1.549 m)    Wt 117 lb 11.2 oz (53.4 kg)    SpO2 98%    BMI 22.24 kg/m   Gen: well appearing, in NAD Card: Reg rate Lungs: comfortable WOB on RA Ext: WWP, no edema   ASSESSMENT/PLAN:   Encounter for examination for admission to assisted living facility [facility] form completed. Meds reviewed and list updated, has some meds at home, will call pharmacy for refills. Flu shot received today. Will obtain labs including Quant gold for admission.      Myles Gip, DO

## 2021-08-19 NOTE — Assessment & Plan Note (Signed)
FL2 form completed. Meds reviewed and list updated, has some meds at home, will call pharmacy for refills. Flu shot received today. Will obtain labs including Quant gold for admission.

## 2021-08-19 NOTE — Telephone Encounter (Signed)
Spoke with Rayvon HCPOA regarding scheduling a Palliative Care consult. He had patient at a doctor's appointment and will call back to schedule.

## 2021-08-21 ENCOUNTER — Telehealth: Payer: Self-pay | Admitting: Student

## 2021-08-21 NOTE — Telephone Encounter (Signed)
Spoke with Rayvon regarding the Palliative referral and he said that patient is in the process of moving to [facility] in [place], he said that patient is going there today for a tour.  He's hoping to find out today wihen they visit, how soon this will take place.  He was in agreement with waiting until patient has moved in for [place] to go out to see her at the facility.  He will call us back when this has taken place.

## 2021-08-22 LAB — QUANTIFERON-TB GOLD PLUS
QuantiFERON Mitogen Value: >10 IU/mL
QuantiFERON Nil Value: 0.06 IU/mL
QuantiFERON TB1 Ag Value: 0.06 IU/mL
QuantiFERON TB2 Ag Value: 0.07 IU/mL
QuantiFERON-TB Gold Plus: NEGATIVE

## 2021-08-22 LAB — CBC WITH DIFFERENTIAL/PLATELET
Basophils Absolute: 0 10*3/uL (ref 0.0–0.2)
Basos: 0 %
EOS (ABSOLUTE): 0.2 10*3/uL (ref 0.0–0.4)
Eos: 6 %
Hematocrit: 37.3 % (ref 34.0–46.6)
Hemoglobin: 12.8 g/dL (ref 11.1–15.9)
Immature Grans (Abs): 0 10*3/uL (ref 0.0–0.1)
Immature Granulocytes: 0 %
Lymphocytes Absolute: 0.7 10*3/uL (ref 0.7–3.1)
Lymphs: 18 %
MCH: 31.2 pg (ref 26.6–33.0)
MCHC: 34.3 g/dL (ref 31.5–35.7)
MCV: 91 fL (ref 79–97)
Monocytes Absolute: 0.4 10*3/uL (ref 0.1–0.9)
Monocytes: 9 %
Neutrophils Absolute: 2.8 10*3/uL (ref 1.4–7.0)
Neutrophils: 67 %
Platelets: 145 10*3/uL — ABNORMAL LOW (ref 150–450)
RBC: 4.1 x10E6/uL (ref 3.77–5.28)
RDW: 12.9 % (ref 11.7–15.4)
WBC: 4.1 10*3/uL (ref 3.4–10.8)

## 2021-08-22 LAB — COMPREHENSIVE METABOLIC PANEL
ALT: 7 IU/L (ref 0–32)
AST: 17 IU/L (ref 0–40)
Albumin/Globulin Ratio: 1.2 (ref 1.2–2.2)
Albumin: 3.1 g/dL — ABNORMAL LOW (ref 3.7–4.7)
Alkaline Phosphatase: 212 IU/L — ABNORMAL HIGH (ref 44–121)
BUN/Creatinine Ratio: 10 — ABNORMAL LOW (ref 12–28)
BUN: 8 mg/dL (ref 8–27)
Bilirubin Total: 0.4 mg/dL (ref 0.0–1.2)
CO2: 24 mmol/L (ref 20–29)
Calcium: 8.3 mg/dL — ABNORMAL LOW (ref 8.7–10.3)
Chloride: 102 mmol/L (ref 96–106)
Creatinine, Ser: 0.77 mg/dL (ref 0.57–1.00)
Globulin, Total: 2.6 g/dL (ref 1.5–4.5)
Glucose: 96 mg/dL (ref 70–99)
Potassium: 3.6 mmol/L (ref 3.5–5.2)
Sodium: 138 mmol/L (ref 134–144)
Total Protein: 5.7 g/dL — ABNORMAL LOW (ref 6.0–8.5)
eGFR: 80 mL/min/{1.73_m2} (ref >59)

## 2021-08-28 ENCOUNTER — Telehealth: Payer: Self-pay

## 2021-08-28 NOTE — Telephone Encounter (Signed)
831 am.  Return call made to [place].  Patient is now at [facility] in [place] at [place] is now ready for patient to be followed by Palliative Care.  Advised that I would follow up with the team regarding patient's location and referral.   Communication sent to [facility], NP and [place] regarding above.

## 2021-09-02 ENCOUNTER — Telehealth: Payer: Self-pay

## 2021-09-02 NOTE — Telephone Encounter (Signed)
Copied from [place] [id number]. Topic: General - Other >> Sep 02, 2021  1:45 PM Valere Dross wrote: Reason for CRM: [place] from [place] called in about needing some admission information due to the pt now living in a facility. Please advise

## 2021-09-03 NOTE — Telephone Encounter (Signed)
[place] will fax over documentation regarding possible changes to Lasix medication. Will forward to Dr. B for review.

## 2021-09-08 NOTE — Telephone Encounter (Signed)
I have responded to med rec's and extensive questions from an RN for this patient but not specifically from that pharmacy and I don't have it in my box today

## 2021-09-08 NOTE — Telephone Encounter (Signed)
Spoke to Borden at [facility] and she reports they have received clarification regarding Lasixs.

## 2021-09-10 ENCOUNTER — Other Ambulatory Visit: Payer: Medicare Other | Admitting: Primary Care

## 2021-09-10 ENCOUNTER — Other Ambulatory Visit: Payer: Self-pay

## 2021-09-10 DIAGNOSIS — E43 Unspecified severe protein-calorie malnutrition: Secondary | ICD-10-CM | POA: Diagnosis not present

## 2021-09-10 DIAGNOSIS — Z515 Encounter for palliative care: Secondary | ICD-10-CM

## 2021-09-10 DIAGNOSIS — R413 Other amnesia: Secondary | ICD-10-CM | POA: Diagnosis not present

## 2021-09-10 DIAGNOSIS — J449 Chronic obstructive pulmonary disease, unspecified: Secondary | ICD-10-CM | POA: Diagnosis not present

## 2021-09-10 NOTE — Progress Notes (Signed)
[facility] Palliative Care Consult Note Telephone: [phone]  Fax: [phone]    Date of encounter: 09/10/21 11:46 AM PATIENT NAME: Mariah [name] [place] [address]   [phone] (home)  DOB: 16-Apr-1946 MRN: [id number] PRIMARY CARE PROVIDER:    [name], MD,  [address] [place] [address] [phone]  REFERRING PROVIDER:   [name], [address] [place],  [place] [address] [phone]  RESPONSIBLE PARTY:    Contact Information     Name Relation Home Work Mobile   [place] Relative Volin Niece   [phone]        I met face to face with patient in [facility]. She and staff provided history. Palliative Care was asked to follow this patient by consultation request of  Bacigalupo, Dionne Bucy, MD to address advance care planning and complex medical decision making. This is a follow up visit.                                   ASSESSMENT AND PLAN / RECOMMENDATIONS:   Advance Care Planning/Goals of Care: Goals include to maximize quality of life and symptom management. Patient/health care surrogate gave his/her permission to discuss.Our advance care planning conversation included a discussion about:    The value and importance of advance care planning  Experiences with loved ones who have been seriously ill or have died  Exploration of personal, cultural or spiritual beliefs that might influence medical decisions  Exploration of goals of care in the event of a sudden injury or illness  Identification of a healthcare agent - BIL Rayvon Review of an  advance directive document  Discussed her coming to ALF after living with niece. She is widowed and has no children. Discussed her life and current care situation, as well as past medical history and her having outlived a younger sister. CODE STATUS: FULL as no other  directives available  I spent 20 minutes providing this consultation. More than 50% of the time in this consultation was spent in counseling and care coordination.  -------------------------------------------------------------------------------------------------------------------------------  Symptom Management/Plan:  I met patient in her ALF. She has newly arrived, several weeks ago and is settling in. She enjoys some communal visits with other residents and an ample snack bin. She endores good appetite. States she's 'off all medications" but does not remember when she had her medications. I will review with ALF staff and have asked for updated list.  Dyspnea: None at rest. Endorses using inhaler in past and would like albuterol inhaler at bedside. Not clear if she is still smoking but has a 60 year pack history.  Nutrition: Has had low albumin in past which seems to be improving. Last weight 117 lbs. Endorses good appetite.  We discussed ACP as above. No ACP on chart other than a living will but no MOSt. I have given her 5 wishes, and Most to review with her POA, Brother in Sports coach Rayvon. We can complete when she's had time to consider the directives.  Follow up Palliative Care Visit: Palliative care will continue to follow for complex medical decision making, advance care planning, and clarification of goals. Return 6-8 weeks or prn.  PPS: 50%  HOSPICE ELIGIBILITY/DIAGNOSIS: no  Chief Complaint: cognitive impairment, ACP needs  HISTORY OF PRESENT ILLNESS:  Mariah [name] is a  76 y.o. year old female  with cognitive impairment, COPD, h/o 60 pack year tobacco use, wt loss, recent relocation to ALF. Needs following for PC needs and ACP .   History obtained from review of EMR, discussion with primary team, and interview with family, facility staff/caregiver and/or Mariah [name].  I reviewed available labs, medications, imaging, studies and related documents from the EMR.  Records reviewed and  summarized above.   ROS   General: NAD EYES: denies vision changes ENMT: denies dysphagia Cardiovascular: denies chest pain, denies DOE Pulmonary: denies cough, denies increased SOB Abdomen: endorses good appetite, denies constipation, endorses continence of bowel GU: denies dysuria, endorses continence of urine MSK:  denies  increased weakness,  no falls reported Skin: denies rashes or wounds Neurological: endorses occ pain, denies insomnia Psych: Endorses positive mood Heme/lymph/immuno: denies bruises, abnormal bleeding  Physical Exam: Current and past weights:117 lbs 1/23. Was 123 lbs a year ago Constitutional: NAD General: frail appearing, thin EYES: anicteric sclera, lids intact, no discharge  ENMT: intact hearing, oral mucous membranes moist, dentition intact CV: no LE edema Pulmonary: no increased work of breathing, no cough, room air Abdomen: intake 100%, no ascites, albumin 3.1 but improved from previous ( 2.4) GU: deferred MSK: + sarcopenia, moves all extremities, ambulatory Skin: warm and dry, no rashes or wounds on visible skin Neuro:  no  new generalized weakness,  mild to moderate cognitive impairment Psych: non-anxious affect, A and O x 2-3 Hem/lymph/immuno: no widespread bruising   Thank you for the opportunity to participate in the care of Mariah [name].  The palliative care team will continue to follow. Please call our office at [phone] if we can be of additional assistance.   Jason Coop, NP DNP, AGPCNP-BC  COVID-19 PATIENT SCREENING TOOL Asked and negative response unless otherwise noted:   Have you had symptoms of covid, tested positive or been in contact with someone with symptoms/positive test in the past 5-10 days?

## 2021-09-11 DIAGNOSIS — L4050 Arthropathic psoriasis, unspecified: Secondary | ICD-10-CM | POA: Diagnosis not present

## 2021-09-11 DIAGNOSIS — Z79899 Other long term (current) drug therapy: Secondary | ICD-10-CM | POA: Diagnosis not present

## 2021-09-11 DIAGNOSIS — L409 Psoriasis, unspecified: Secondary | ICD-10-CM | POA: Diagnosis not present

## 2021-09-12 ENCOUNTER — Telehealth: Payer: Self-pay | Admitting: Family Medicine

## 2021-09-12 NOTE — Telephone Encounter (Signed)
[place] from [place] called in about needing dermatologist information for pt.  [place] requested a call back.

## 2021-09-12 NOTE — Telephone Encounter (Signed)
FYI Advised [place] that patient may have seen Dr. Nehemiah Massed. They will contact Dr. Nehemiah Massed regarding Cosenty[facility].

## 2021-09-17 ENCOUNTER — Other Ambulatory Visit: Payer: Self-pay | Admitting: Family Medicine

## 2021-09-19 ENCOUNTER — Other Ambulatory Visit: Payer: Self-pay | Admitting: Family Medicine

## 2021-09-19 DIAGNOSIS — J439 Emphysema, unspecified: Secondary | ICD-10-CM

## 2021-09-19 NOTE — Telephone Encounter (Signed)
Requested medication (s) are due for refill today - expired Rx ? ?Requested medication (s) are on the active medication list -yes ? ?Future visit scheduled -no ? ?Last refill: 05/22/20 8g ? ?Notes to clinic: Request RF: Rx provided by provider no longer at practice- sent for review  ? ?Requested Prescriptions  ?Pending Prescriptions Disp Refills  ? VENTOLIN HFA 108 (90 Base) MCG/ACT inhaler [Pharmacy Med Name: VENTOLIN HFA 90MCG INHALE] 18 g 10  ?  Sig: USE 2 PUFFS BY MOUTH EVERY 6 HOURS AS NEEDED FOR SHORTNESS OF BREATH.  ?  ? Pulmonology:  Beta Agonists 2 Failed - 09/19/2021 12:12 PM  ?  ?  Failed - Last BP in normal range  ?  BP Readings from Last 1 Encounters:  ?08/19/21 (!) 149/82  ?  ?  ?  ?  Passed - Last Heart Rate in normal range  ?  Pulse Readings from Last 1 Encounters:  ?08/19/21 73  ?  ?  ?  ?  Passed - Valid encounter within last 12 months  ?  Recent Outpatient Visits   ? ?      ? 1 month ago Encounter for examination for admission to assisted living facility  ? [place], DO  ? 7 months ago Transaminitis  ? [facility] Bacigalupo, Dionne Bucy, MD  ? 10 months ago Encounter for annual wellness visit (AWV) in Medicare patient  ? [facility], Dionne Bucy, MD  ? 11 months ago Syncope, unspecified syncope type  ? [facility] Bacigalupo, Dionne Bucy, MD  ? 1 year ago Essential (primary) hypertension  ? [facility] Bacigalupo, Dionne Bucy, MD  ? ?  ?  ? ?  ?  ?  ? ? ? ?Requested Prescriptions  ?Pending Prescriptions Disp Refills  ? VENTOLIN HFA 108 (90 Base) MCG/ACT inhaler [Pharmacy Med Name: VENTOLIN HFA 90MCG INHALE] 18 g 10  ?  Sig: USE 2 PUFFS BY MOUTH EVERY 6 HOURS AS NEEDED FOR SHORTNESS OF BREATH.  ?  ? Pulmonology:  Beta Agonists 2 Failed - 09/19/2021 12:12 PM  ?  ?  Failed - Last BP in normal range  ?  BP Readings from Last 1 Encounters:  ?08/19/21 (!) 149/82  ?  ?  ?  ?  Passed - Last Heart Rate in normal  range  ?  Pulse Readings from Last 1 Encounters:  ?08/19/21 73  ?  ?  ?  ?  Passed - Valid encounter within last 12 months  ?  Recent Outpatient Visits   ? ?      ? 1 month ago Encounter for examination for admission to assisted living facility  ? [place], DO  ? 7 months ago Transaminitis  ? [facility] Bacigalupo, Dionne Bucy, MD  ? 10 months ago Encounter for annual wellness visit (AWV) in Medicare patient  ? [facility], Dionne Bucy, MD  ? 11 months ago Syncope, unspecified syncope type  ? [facility] Bacigalupo, Dionne Bucy, MD  ? 1 year ago Essential (primary) hypertension  ? [facility] Bacigalupo, Dionne Bucy, MD  ? ?  ?  ? ?  ?  ?  ? ? ? ?

## 2021-10-15 ENCOUNTER — Other Ambulatory Visit: Payer: Self-pay

## 2021-10-15 ENCOUNTER — Non-Acute Institutional Stay: Payer: Medicare Other | Admitting: Primary Care

## 2021-10-16 ENCOUNTER — Non-Acute Institutional Stay: Payer: Medicare Other | Admitting: Primary Care

## 2021-10-16 DIAGNOSIS — R413 Other amnesia: Secondary | ICD-10-CM

## 2021-10-16 DIAGNOSIS — R531 Weakness: Secondary | ICD-10-CM | POA: Diagnosis not present

## 2021-10-16 DIAGNOSIS — Z515 Encounter for palliative care: Secondary | ICD-10-CM

## 2021-10-16 DIAGNOSIS — J449 Chronic obstructive pulmonary disease, unspecified: Secondary | ICD-10-CM | POA: Diagnosis not present

## 2021-10-16 NOTE — Progress Notes (Signed)
? ? ?Manufacturing engineer ?Community Palliative Care Consult Note ?Telephone: [phone]  ?Fax: [phone]  ? ? ?Date of encounter: 10/16/21 ?12:43 PM ?PATIENT NAME: Mariah [name] ?[place] [address]   ?[phone] (home)  ?DOB: January 26, 1946 ?MRN: [id number] ?PRIMARY CARE PROVIDER:    ?[name], MD,  ?[address] ?[place] [address] ?[phone] ? ?REFERRING PROVIDER:   ?[name], MD ?[address] ?[place],  [place] [address] ?[phone] ? ?RESPONSIBLE PARTY:    ?Contact Information   ? ? Name Relation Home Work Mobile  ? Bennette,Rayvon Relative [phone]    ? Jermeka, [place] Niece   [phone]  ? ?  ? ? ? ?I met face to face with patient in [facility] living facility. Palliative Care was asked to follow this patient by consultation request of  Bacigalupo, Dionne Bucy, MD to address advance care planning and complex medical decision making. This is a follow up visit. ? ?                                 ASSESSMENT AND PLAN / RECOMMENDATIONS:  ? ?Advance Care Planning/Goals of Care: Goals include to maximize quality of life and symptom management.  ?Full Code status ? ?Symptom Management/Plan: ? ? Patient endorses smoking still. She feels she has gained some weight and reports 130 lbs. She is settling in at her new ALF now here several months. 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 There is a piano but she states it needs tuning.  I encouraged her to do some activities she enjoys.  ? ?I recommend eucerin or sarna cream for her skin. Hands and arms had very dry and red skin. Sarna would improve comfort as well as be curative for dermatitis. She is tolerating her medications well and denies falls.Using a cane to ambulate in building, and goes outdoors to smoke.  ? ? ?Follow up Palliative Care Visit: Palliative care will continue to follow for complex medical decision making, advance care planning, and  clarification of goals. Return 8 weeks or prn. ? ?This visit was coded based on medical decision making (MDM). ? ?PPS: 40% ? ?HOSPICE ELIGIBILITY/DIAGNOSIS: TBD ? ?Chief Complaint: cognitive impairment ? ?HISTORY OF PRESENT ILLNESS:  Mariah [name] is a 76 y.o. year old female  with cognitive impairment, COPD, CHF, dry skin. Patient seen today to review palliative care needs to include medical decision making and advance care planning as appropriate.  ? ?History obtained from review of EMR, discussion with primary team, and interview with family, facility staff/caregiver and/or Mariah [name].  ?I reviewed available labs, medications, imaging, studies and related documents from the EMR.  Records reviewed and summarized above.  ? ?ROS ? ? ?General: NAD ?EYES: denies vision changes ?ENMT: denies dysphagia ?Cardiovascular: denies chest pain, denies DOE ?Pulmonary: denies cough, denies increased SOB ?Abdomen: endorses good appetite, denies constipation, endorses continence of bowel ?GU: denies dysuria, endorses continence of urine ?MSK:  denies  increased weakness,  no falls reported, using cane ?Skin: denies rashes or wounds, endorses dry skin ?Neurological: denies pain, denies insomnia ?Psych: Endorses positive mood ? ?Physical Exam: ?Current and past weights: 130 lbs reported ?Constitutional: NAD ?General: frail appearing, WNWD ?EYES: anicteric sclera, lids intact, no discharge  ?ENMT: intact hearing, oral mucous membranes moist, dentition missing ?CV:no LE edema ?Pulmonary: no increased work of breathing, no cough, room air ?Abdomen: intake  75%,soft and non tender, no ascites ?MSK: no sarcopenia, moves all extremities, ambulatory with cane ?Skin: warm and dry, no rashes or wounds on visible skin ?Neuro:  no new generalized weakness,  mild  cognitive impairment, non-anxious affect ? ? ?Thank you for the opportunity to participate in the care of Mariah [name].  The palliative care team will continue to follow. Please call our  office at [phone] if we can be of additional assistance.  ? ?Jason Coop, NP DNP, AGPCNP-BC ? ?COVID-19 PATIENT SCREENING TOOL ?Asked and negative response unless otherwise noted:  ? ?Have you had symptoms of covid, tested positive or been in contact with someone with symptoms/positive test in the past 5-10 days?  ? ?

## 2021-10-21 IMAGING — CT CT ANGIO CHEST
2 of 6 series · 18 of 46 positions shown · IV contrast (APPLIED)
Comparison: 04/24/2020

CLINICAL DATA: Hypoxia, dyspnea

EXAM:
CT ANGIOGRAPHY CHEST WITH CONTRAST
TECHNIQUE: Multidetector CT imaging of the chest was performed using the
standard protocol during bolus administration of intravenous
contrast. Multiplanar CT image reconstructions and MIPs were
obtained to evaluate the vascular anatomy.
CONTRAST:  75mL OMNIPAQUE IOHEXOL 350 MG/ML SOLN

[Series 5: thins · axial · 0.68mm/px · z∈[-803,-536]mm · 15 of 293 slices shown]
[im 13/293  lung]
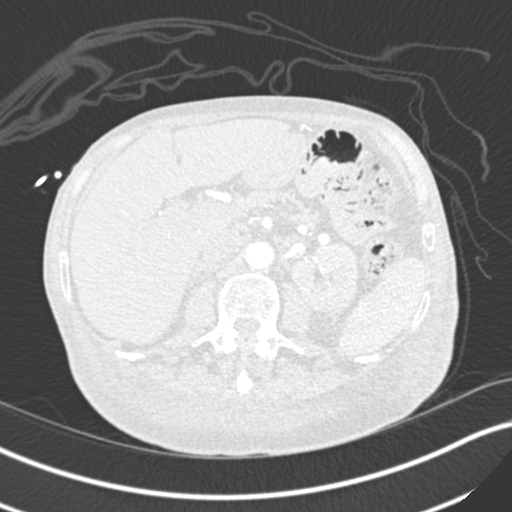
[im 39/293  soft-tissue]
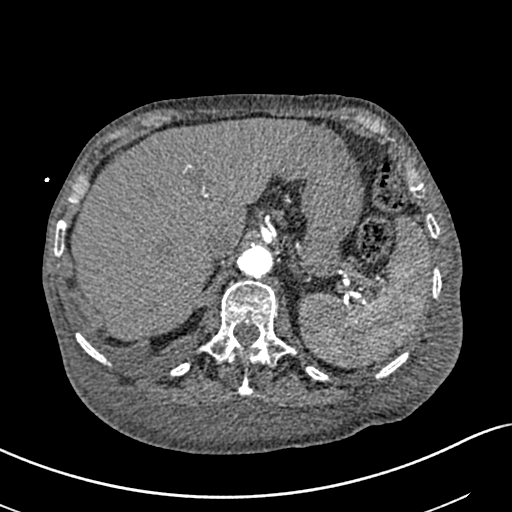
[im 51/293  lung]
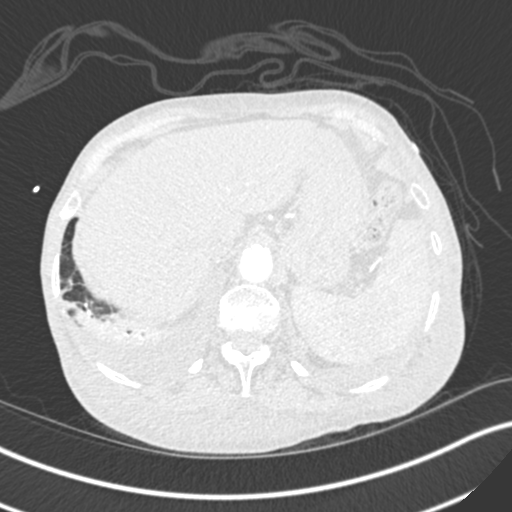
[im 77/293  soft-tissue]
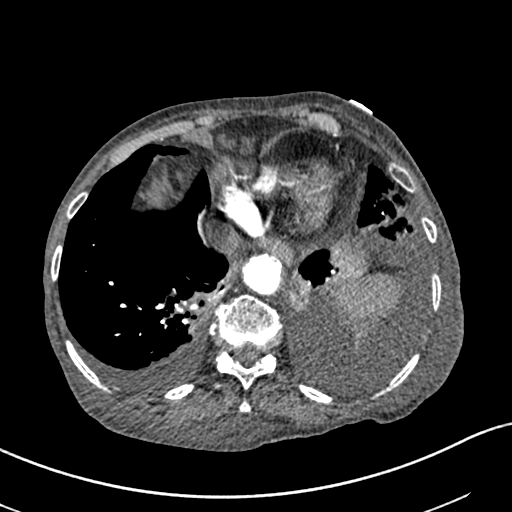
[im 89/293  lung]
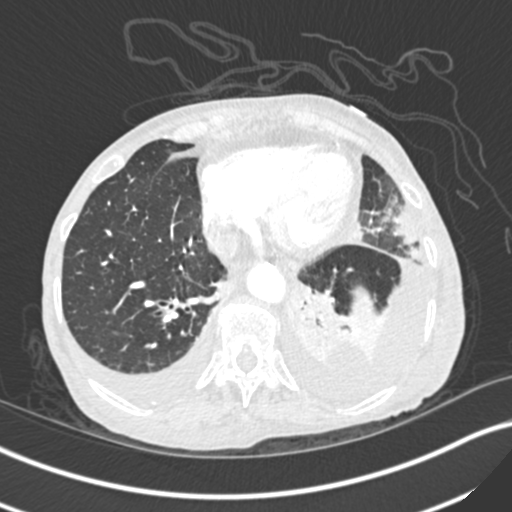
[im 115/293  soft-tissue]
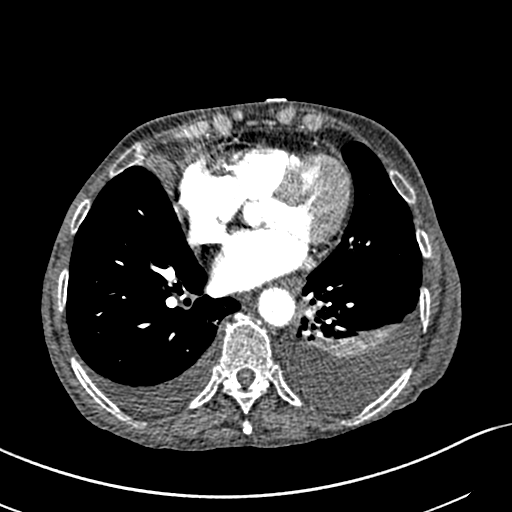
[im 127/293  lung]
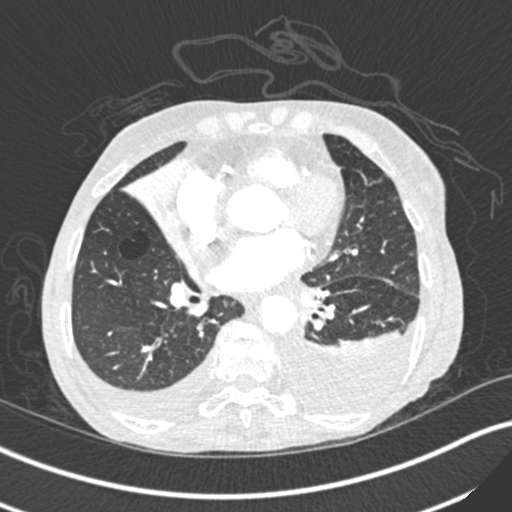
[im 153/293  soft-tissue]
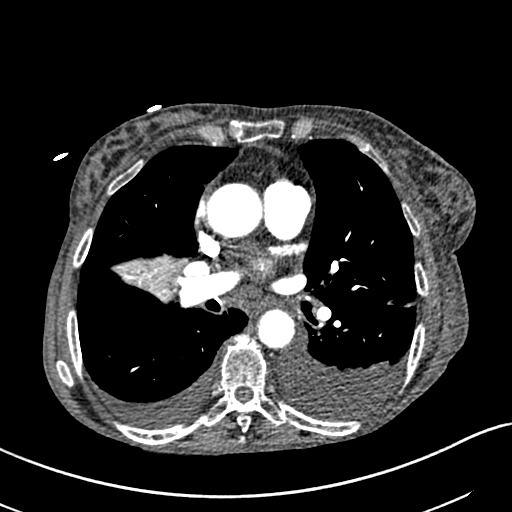
[im 166/293  lung]
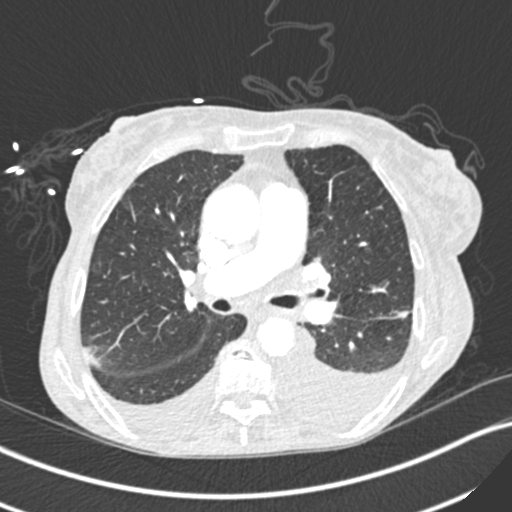
[im 178/293  soft-tissue]
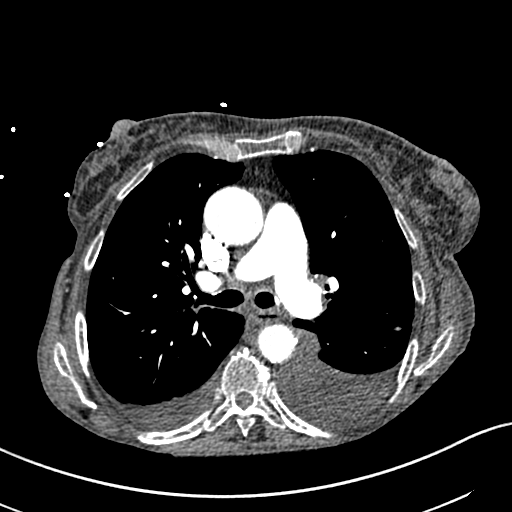
[im 204/293  lung]
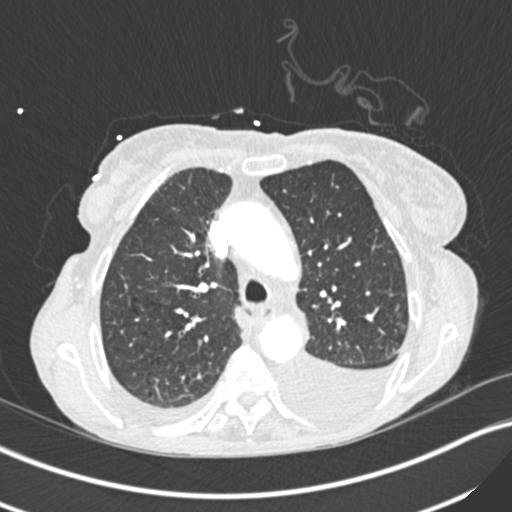
[im 216/293  soft-tissue]
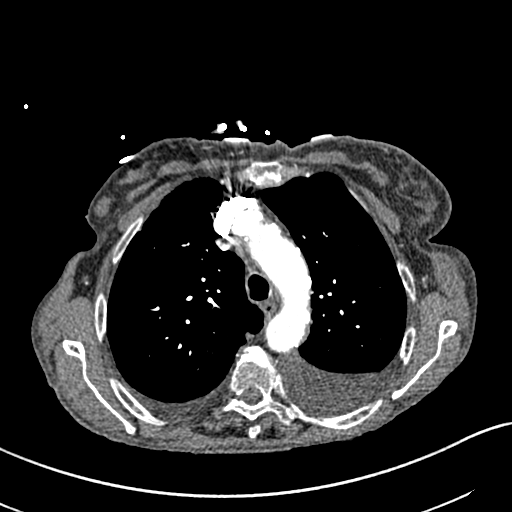
[im 242/293  lung]
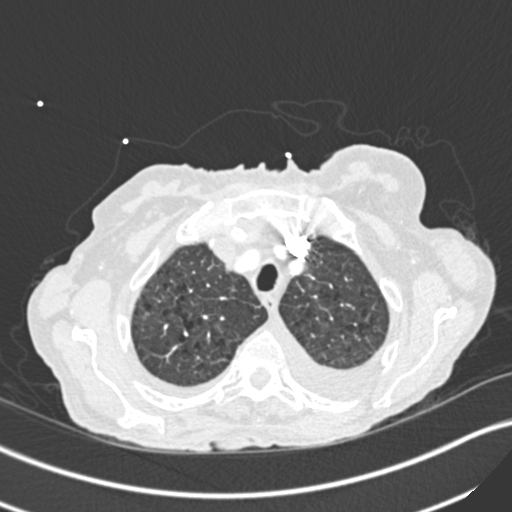
[im 254/293  soft-tissue]
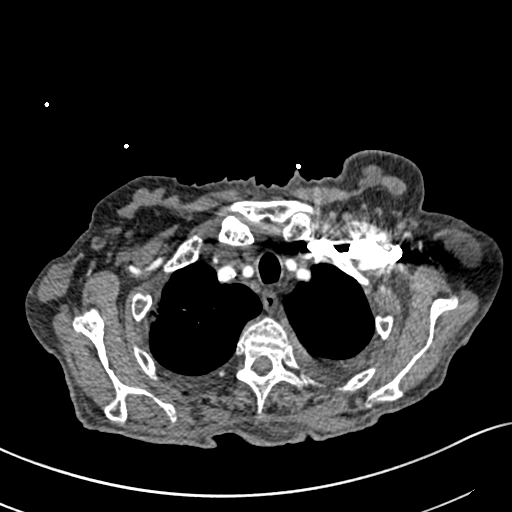
[im 280/293  lung]
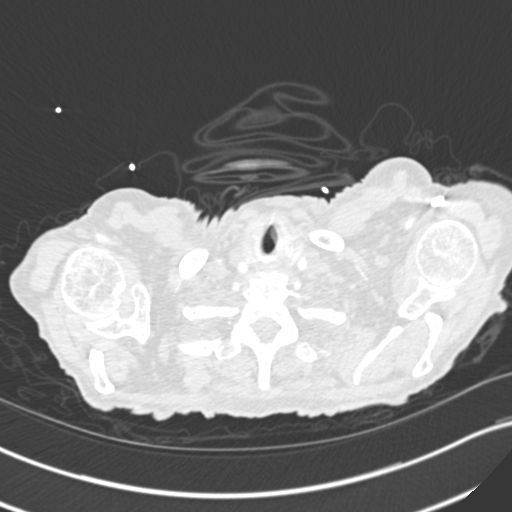

[Series 7: coronal mpr · coronal · 0.55mm/px · 3 of 80 slices shown]
[im 20/80  soft-tissue]
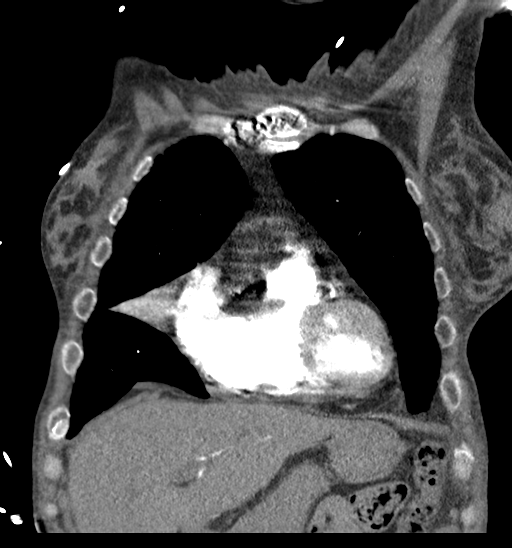
[im 40/80  soft-tissue]
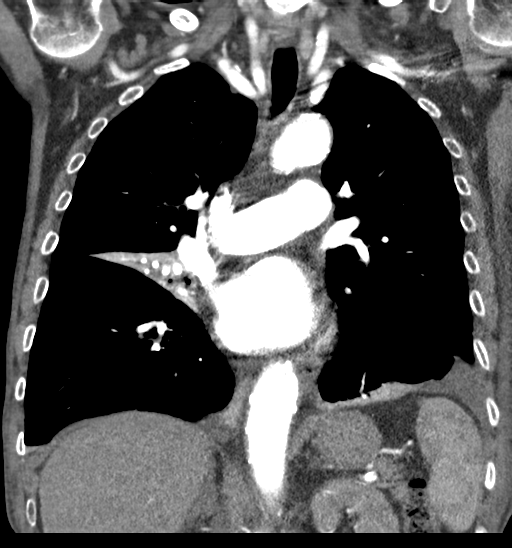
[im 60/80  soft-tissue]
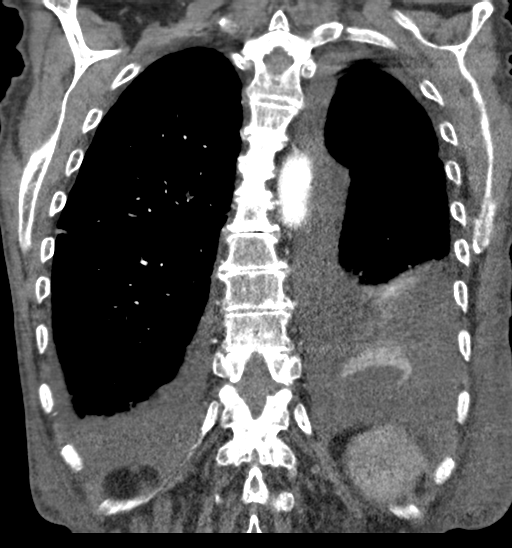

[18 of 46 positions shown; findings below may reference images not displayed]

FINDINGS: Cardiovascular: There is excellent opacification of the pulmonary
arterial tree. There is no intraluminal filling defect identified to
suggest acute pulmonary embolism. The central pulmonary arteries are
of normal caliber. Moderate to severe multi-vessel coronary artery
calcification. Global cardiac size is within normal limits. No
pericardial effusion. There is extensive atherosclerotic
calcification within the thoracic aorta. No aortic aneurysm.

Mediastinum/Nodes: Thyroid unremarkable. No pathologic thoracic
adenopathy. Esophagus unremarkable

Lungs/Pleura: Mild centrilobular emphysema. Complete collapse of the
right middle lobe has developed. A central obstructing lesion is not
identified, however. There has developed moderate bilateral pleural
effusions, left greater than right, with subtotal collapse of the
lower lobes bilaterally, left greater than right. Superimposed focal
consolidation is seen within the basilar lingula, likely infectious
or inflammatory in the acute setting. No pneumothorax.

Upper Abdomen: No acute abnormality.

Musculoskeletal: There has developed extensive dependent
subcutaneous body wall edema within the posterior thoracoabdominal
wall, new since prior examination, nonspecific. This could reflect
dependent edema in the setting of anasarca, or by a local
inflammatory process such as cellulitis. No acute bone abnormality.

Review of the MIP images confirms the above findings.
IMPRESSION: No pulmonary embolism.

Moderate to severe multi-vessel coronary artery calcification.

Interval development of complete right middle lobe collapse and
subtotal collapse of the lower lobes bilaterally, likely passive
related to new moderate bilateral pleural effusions.

Focal pulmonary infiltrate within the basilar lingula, likely
infectious in the acute setting. Aspiration could appear similarly.

Dependent subcutaneous body wall edema within the posterior
thoracoabdominal wall, new since prior examination. Correlation with
clinical examination would be helpful to exclude a local
inflammatory process such as cellulitis. Alternatively, this could
be seen in the setting of anasarca and resultant dependent body wall
edema.

Aortic Atherosclerosis (TWWOT-JRY.Y) and Emphysema (TWWOT-3EZ.7).

## 2021-10-22 DIAGNOSIS — H3530 Unspecified macular degeneration: Secondary | ICD-10-CM | POA: Diagnosis not present

## 2021-10-22 DIAGNOSIS — H2513 Age-related nuclear cataract, bilateral: Secondary | ICD-10-CM | POA: Diagnosis not present

## 2021-10-23 ENCOUNTER — Other Ambulatory Visit: Payer: Self-pay | Admitting: Family Medicine

## 2021-10-23 DIAGNOSIS — J439 Emphysema, unspecified: Secondary | ICD-10-CM

## 2021-10-23 NOTE — Telephone Encounter (Signed)
Requested medication (s) are due for refill today: yes ? ?Requested medication (s) are on the active medication list: yes ? ?Last refill:  09/01/21 #60/0 ? ?Future visit scheduled: no ? ?Notes to clinic:  Unable to refill per protocol, medication not assigned to the refill protocol. ? ? ?  ?Requested Prescriptions  ?Pending Prescriptions Disp Refills  ? BREO ELLIPTA 100-25 MCG/ACT AEPB [Pharmacy Med Name: BREO ELLIPTA 100-25 MCG INH] 60 each 10  ?  Sig: USE ONE INHALATION BY MOUTH ONCE A DAY. RINSE MOUTH AFTER EACH USE.  ?  ? There is no refill protocol information for this order  ?  ? ?

## 2021-11-21 ENCOUNTER — Other Ambulatory Visit: Payer: Self-pay | Admitting: Family Medicine

## 2021-11-21 NOTE — Telephone Encounter (Signed)
Requested medication (s) are due for refill today:   No ? ?Requested medication (s) are on the active medication list:   No ? ?Future visit scheduled:   No ? ? ?Last ordered: This request is for 50 mg which is not on her med list.   That one is 25 mg and is historical.    ? ?Requested Prescriptions  ?Pending Prescriptions Disp Refills  ? metoprolol succinate (TOPROL-XL) 50 MG 24 hr tablet [Pharmacy Med Name: METOPROLOL SUCC ER 50 MG TAB] 15 tablet 10  ?  Sig: TAKE 1/2 TABLET ='25MG'$  BY MOUTH ONCE DAILY AT BEDTIME. **DO NOT CRUSH**  ?  ? Cardiovascular:  Beta Blockers Failed - 11/21/2021 12:39 PM  ?  ?  Failed - Last BP in normal range  ?  BP Readings from Last 1 Encounters:  ?08/19/21 (!) 149/82  ?  ?  ?  ?  Passed - Last Heart Rate in normal range  ?  Pulse Readings from Last 1 Encounters:  ?08/19/21 73  ?  ?  ?  ?  Passed - Valid encounter within last 6 months  ?  Recent Outpatient Visits   ? ?      ? 3 months ago Encounter for examination for admission to assisted living facility  ? [place], DO  ? 9 months ago Transaminitis  ? [facility] Bacigalupo, Dionne Bucy, MD  ? 1 year ago Encounter for annual wellness visit (AWV) in Medicare patient  ? [facility], Dionne Bucy, MD  ? 1 year ago Syncope, unspecified syncope type  ? [facility] Bacigalupo, Dionne Bucy, MD  ? 1 year ago Essential (primary) hypertension  ? [facility] Bacigalupo, Dionne Bucy, MD  ? ?  ?  ? ? ?  ?  ?  ? ?

## 2021-11-25 NOTE — Telephone Encounter (Signed)
Please fill the 25 mg dose instead ?

## 2021-12-04 ENCOUNTER — Telehealth: Payer: Self-pay

## 2021-12-04 NOTE — Telephone Encounter (Signed)
Copied from [place] [phone]. Topic: General - Other >> Dec 04, 2021 11:24 AM Loma [place] wrote: Reason for CRM: Pt Assisted Living  Staff member, Tammy @ [facility] states that she has faxed twice a request for Dr B to complete pg 2 of their form . She states all they need is STEP 4- she needs to initial those 4 boxes. Refax to Tammy at [phone]

## 2021-12-04 NOTE — Telephone Encounter (Signed)
Forms faxed by front desk.

## 2021-12-04 NOTE — Telephone Encounter (Signed)
Form was just received in my box this morning as I was not in the office yesterday.  It has been completed and will be faxed back today (given to front desk).

## 2021-12-23 NOTE — Progress Notes (Signed)
I,Sulibeya S Dimas,acting as a Education administrator for Lavon Paganini, MD.,have documented all relevant documentation on the behalf of Lavon Paganini, MD,as directed by  Lavon Paganini, MD while in the presence of Lavon Paganini, MD.     Established patient visit   Patient: Mariah [name]   DOB: Jul 09, 1946   76 y.o. Female  MRN: [id number] Visit Date: 12/25/2021  Today's healthcare provider: Lavon Paganini, MD   Chief Complaint  Patient presents with   Follow-up   Subjective    HPI  Follow up for dementia  The patient was last seen for this 4 months ago. Changes made at last visit include FL2 form completed. Patient requesting form filled out for independent assessment for personal care services [facility]).  She feels that condition is Unchanged. She is not having side effects.   -----------------------------------------------------------------------------------------   Medications: Outpatient Medications Prior to Visit  Medication Sig   albuterol (VENTOLIN HFA) 108 (90 Base) MCG/ACT inhaler USE 2 PUFFS BY MOUTH EVERY 6 HOURS AS NEEDED FOR SHORTNESS OF BREATH.   aspirin EC 81 MG tablet Take 81 mg by mouth daily. Swallow whole.   betamethasone dipropionate (DIPROLENE) 0.05 % ointment Apply topically daily.   BREO ELLIPTA 100-25 MCG/ACT AEPB USE ONE INHALATION BY MOUTH ONCE A DAY. RINSE MOUTH AFTER EACH USE.   calcipotriene (DOVONOX) 0.005 % cream APPLY TO SKIN ONCE A DAY APPLY DAILY ON HANDS, FEET AND WRIST X 2 WEEKS, THEN USE 5 DAYS A WEEK (Patient not taking: Reported on 08/19/2021)   Calcipotriene 0.005 % solution APPLY 1 A SMALL AMOUNT TO SCALP ONCE A DAY APPLY TO SCALP DAILY FOR 2 WEEKS, THEN USE ON SCALP 5 DAYS A WEEK   Cholecalciferol (VITAMIN D3) 2000 units TABS Take 2,000 Units by mouth daily. (Patient not taking: Reported on 08/19/2021)   clobetasol (TEMOVATE) 0.05 % external solution APPLY TOPICALLY TO SCALP AREA TWICE A DAY.   COSENTYX SENSOREADY, 300 MG, 150 MG/ML  SOAJ Inject 2 Syringes into the skin every 28 (twenty-eight) days.   donepezil (ARICEPT) 10 MG tablet Take 10 mg by mouth daily.   folic acid (FOLVITE) 1 MG tablet Take 1 mg by mouth daily.   furosemide (LASIX) 40 MG tablet Take 1 tablet (40 mg total) by mouth daily. If weight 127 lb or less, take 20 mg by mouth daily. (Patient not taking: Reported on 02/27/2021)   gabapentin (NEURONTIN) 300 MG capsule TAKE 1 CAPSULE BY MOUTH TWICE A DAY   levocetirizine (XYZAL) 5 MG tablet TAKE (1) TABLET BY MOUTH ONCE A DAY IN THE EVENING   loperamide (IMODIUM) 2 MG capsule Take 1 capsule (2 mg total) by mouth as needed for diarrhea or loose stools. (Patient not taking: Reported on 08/19/2021)   memantine (NAMENDA) 5 MG tablet Take 1 tablet by mouth daily.   metoprolol succinate (TOPROL-XL) 25 MG 24 hr tablet Take 25 mg by mouth at bedtime.   metoprolol succinate (TOPROL-XL) 25 MG 24 hr tablet Take 1 tablet (25 mg total) by mouth daily.   oxyCODONE (OXY IR/ROXICODONE) 5 MG immediate release tablet Take by mouth.   pantoprazole (PROTONIX) 20 MG tablet TAKE 1 TABLET (20 MG TOTAL) BY MOUTH 2 (TWO) TIMES DAILY BEFORE A MEAL.   rOPINIRole (REQUIP) 1 MG tablet TAKE 1 TABLET BY MOUTH EVERYDAY AT BEDTIME   traMADol (ULTRAM) 50 MG tablet TAKE 1 TABLET BY MOUTH EVERY 6 HOURS AS NEEDED FOR MODERATE OR SEVERE PAIN   No facility-administered medications prior to visit.  Review of Systems per HPI     Objective    BP 106/76 (BP Location: Left Arm, Patient Position: Sitting, Cuff Size: Normal)   Pulse (!) 112   Temp 98.7 F (37.1 C) (Oral)   Resp 16   Wt 134 lb 9.6 oz (61.1 kg)   SpO2 96%   BMI 25.43 kg/m    Physical Exam Vitals reviewed.  Constitutional:      General: She is not in acute distress.    Appearance: Normal appearance. She is well-developed. She is not diaphoretic.  HENT:     Head: Normocephalic and atraumatic.  Eyes:     General: No scleral icterus.    Conjunctiva/sclera: Conjunctivae  normal.  Neck:     Thyroid: No thyromegaly.  Cardiovascular:     Rate and Rhythm: Normal rate and regular rhythm.     Heart sounds: Murmur heard.  Pulmonary:     Effort: Pulmonary effort is normal. No respiratory distress.     Breath sounds: Normal breath sounds. No wheezing, rhonchi or rales.  Musculoskeletal:     Cervical back: Neck supple.     Right lower leg: No edema.     Left lower leg: No edema.     Comments: Swelling of Olecranon bursa of L elbow  Lymphadenopathy:     Cervical: No cervical adenopathy.  Skin:    General: Skin is warm and dry.     Findings: No rash.  Neurological:     Mental Status: She is alert and oriented to person, place, and time. Mental status is at baseline.  Psychiatric:        Mood and Affect: Mood normal.        Behavior: Behavior normal.       No results found for any visits on 12/25/21.  Assessment & Plan     Problem List Items Addressed This Visit       Cardiovascular and Mediastinum   Essential (primary) hypertension - Primary    Well controlled Continue current medications Recheck metabolic panel F/u in 6 months       Relevant Orders   Comprehensive metabolic panel   Peripheral arterial disease (HCC)    F/b vascular surgery Important to stop smoking      Congestive heart failure (HCC)    Chronic and stable No med changes        Respiratory   COPD (chronic obstructive pulmonary disease) (HCC)    Chronic and stable No med changes        Nervous and Auditory   Alzheimer's dementia ([place])    Chronic and stable At ALF and doing well POA with her today PCS form completed today        Musculoskeletal and Integument   Psoriatic arthritis ([place])    F/b Rheum Stable       Olecranon bursitis of left elbow    New problem Noted on exam today No signs of infection Discussed icing, rest, compression wrapping Return precautions discussed - may need Ortho referral if not improving        Genitourinary   CKD  (chronic kidney disease), stage III ([place])    Continue to follow with Nephrology Chronic and stable Avoid nephrotoxic meds       Relevant Orders   Comprehensive metabolic panel     Hematopoietic and Hemostatic   Thrombocytopenia ([place])    Stable Reviewed last CBC        Other   Hyperlipidemia    Not currently  on a statin Recheck FLP and CMP      Relevant Orders   Lipid panel   Comprehensive metabolic panel   Protein-calorie malnutrition, severe    Weight has stabilized Continue to monitor      Other Visit Diagnoses     Chronic diastolic (congestive) heart failure (HCC)   (Chronic)          Return in about 6 months (around 06/26/2022) for CPE, AWV.      I, Lavon Paganini, MD, have reviewed all documentation for this visit. The documentation on 12/25/21 for the exam, diagnosis, procedures, and orders are all accurate and complete.   Dereona Kolodny, Dionne Bucy, MD, MPH [facility]

## 2021-12-25 ENCOUNTER — Encounter: Payer: Self-pay | Admitting: Family Medicine

## 2021-12-25 ENCOUNTER — Ambulatory Visit (INDEPENDENT_AMBULATORY_CARE_PROVIDER_SITE_OTHER): Payer: Medicare Other | Admitting: Family Medicine

## 2021-12-25 VITALS — BP 106/76 | HR 112 | Temp 98.7°F | Resp 16 | Wt 134.6 lb

## 2021-12-25 DIAGNOSIS — E785 Hyperlipidemia, unspecified: Secondary | ICD-10-CM | POA: Diagnosis not present

## 2021-12-25 DIAGNOSIS — I10 Essential (primary) hypertension: Secondary | ICD-10-CM | POA: Diagnosis not present

## 2021-12-25 DIAGNOSIS — L4050 Arthropathic psoriasis, unspecified: Secondary | ICD-10-CM | POA: Diagnosis not present

## 2021-12-25 DIAGNOSIS — F02B0 Dementia in other diseases classified elsewhere, moderate, without behavioral disturbance, psychotic disturbance, mood disturbance, and anxiety: Secondary | ICD-10-CM | POA: Diagnosis not present

## 2021-12-25 DIAGNOSIS — I739 Peripheral vascular disease, unspecified: Secondary | ICD-10-CM | POA: Diagnosis not present

## 2021-12-25 DIAGNOSIS — D696 Thrombocytopenia, unspecified: Secondary | ICD-10-CM | POA: Diagnosis not present

## 2021-12-25 DIAGNOSIS — N1832 Chronic kidney disease, stage 3b: Secondary | ICD-10-CM | POA: Diagnosis not present

## 2021-12-25 DIAGNOSIS — G301 Alzheimer's disease with late onset: Secondary | ICD-10-CM | POA: Diagnosis not present

## 2021-12-25 DIAGNOSIS — M7022 Olecranon bursitis, left elbow: Secondary | ICD-10-CM | POA: Diagnosis not present

## 2021-12-25 DIAGNOSIS — E43 Unspecified severe protein-calorie malnutrition: Secondary | ICD-10-CM

## 2021-12-25 DIAGNOSIS — I5032 Chronic diastolic (congestive) heart failure: Secondary | ICD-10-CM | POA: Diagnosis not present

## 2021-12-25 DIAGNOSIS — J449 Chronic obstructive pulmonary disease, unspecified: Secondary | ICD-10-CM

## 2021-12-25 NOTE — Assessment & Plan Note (Signed)
Stable Reviewed last CBC

## 2021-12-25 NOTE — Assessment & Plan Note (Signed)
Continue to follow with Nephrology Chronic and stable Avoid nephrotoxic meds

## 2021-12-25 NOTE — Assessment & Plan Note (Signed)
Well controlled Continue current medications Recheck metabolic panel F/u in 6 months  

## 2021-12-25 NOTE — Assessment & Plan Note (Signed)
F/b vascular surgery Important to stop smoking

## 2021-12-25 NOTE — Assessment & Plan Note (Signed)
Weight has stabilized Continue to monitor

## 2021-12-25 NOTE — Assessment & Plan Note (Signed)
F/b Rheum Stable

## 2021-12-25 NOTE — Assessment & Plan Note (Signed)
Chronic and stable No med changes 

## 2021-12-25 NOTE — Assessment & Plan Note (Signed)
Chronic and stable At ALF and doing well POA with her today PCS form completed today

## 2021-12-25 NOTE — Assessment & Plan Note (Signed)
Not currently on a statin Recheck FLP and CMP

## 2021-12-25 NOTE — Assessment & Plan Note (Signed)
New problem Noted on exam today No signs of infection Discussed icing, rest, compression wrapping Return precautions discussed - may need Ortho referral if not improving

## 2021-12-29 ENCOUNTER — Other Ambulatory Visit: Payer: Self-pay | Admitting: Family Medicine

## 2022-01-08 DIAGNOSIS — Z79899 Other long term (current) drug therapy: Secondary | ICD-10-CM | POA: Diagnosis not present

## 2022-01-08 DIAGNOSIS — E119 Type 2 diabetes mellitus without complications: Secondary | ICD-10-CM | POA: Diagnosis not present

## 2022-01-08 DIAGNOSIS — E782 Mixed hyperlipidemia: Secondary | ICD-10-CM | POA: Diagnosis not present

## 2022-01-08 DIAGNOSIS — D518 Other vitamin B12 deficiency anemias: Secondary | ICD-10-CM | POA: Diagnosis not present

## 2022-01-12 DIAGNOSIS — L409 Psoriasis, unspecified: Secondary | ICD-10-CM | POA: Diagnosis not present

## 2022-01-12 DIAGNOSIS — Z7960 Long term (current) use of unspecified immunomodulators and immunosuppressants: Secondary | ICD-10-CM | POA: Diagnosis not present

## 2022-01-12 DIAGNOSIS — L4050 Arthropathic psoriasis, unspecified: Secondary | ICD-10-CM | POA: Diagnosis not present

## 2022-01-22 NOTE — Progress Notes (Signed)
Cardiology Office Note  Date:  01/23/2022   ID:  [name], DOB 1945-12-21, MRN [id number]  PCP:  [name], MD   Chief Complaint  Patient presents with   6 month follow up     "Doing well." Medications reviewed by the patient verbally.     HPI:  [name] is a 76 year-old woman with past medical history of COPD, smoker Anemia DVT October 2021 on Eliquis Chronic kidney disease AAA Cognitive deficiency/Alzheimer's dementia Who presents for  chronic diastolic CHF, tricuspid valve regurgitation Pacer Last seen in clinic by myself January 2022  Lives at [facility]: Weight up 10 pounds since January 2022  Denies leg swelling Has psoriasis with chronic itching and dry skin  Denies significant ankle swelling, no shortness of breath on exertion Denies excessive fluid intake No regular exercise program No falls  Denies any recent COPD exacerbation Continues to smoke  EKG personally reviewed by myself on todays visit NSR rate 88 no ST or T wave changes   Presents today with brother-in-law Last seen in clinic June 24, 2020 Seen in the emergency room July 03, 2020 for COPD exacerbation   admission to hospital November 2021 COPD exacerbation, right-sided pleural effusion Treated with Levaquin, also Lasix  Hospital admission May 26, 2020 for anemia hemoglobin 6.8, large right thigh hematoma after fall Eliquis aspirin held Also with left pleural effusion at the time receiving IV Lasix  Readmitted to the hospital June 02, 2020 worsening pleural effusion Underwent thoracentesis 600 cc out  Echocardiogram November 2021 ejection fraction 60 to 65% Normal RV function, moderately elevated right heart pressures, estimated 49 mmHg plus right atrial pressure, roughly 55 mmHg Question of pleural effusion Mild to moderate TR  Echocardiogram Normal ejection fraction 60%, moderately elevated right heart pressures  No edema on today's visit On  lasix 40 daily, reports that she feels relatively stable Denies excessive fluid intake   PMH:   has a past medical history of Abdominal aortic aneurysm (AAA) ([place]), Arthritis, CAP (community acquired pneumonia) (06/03/2020), Chronic diastolic heart failure ([place]), COPD (chronic obstructive pulmonary disease) ([place]), GERD (gastroesophageal reflux disease), Headache, History of adenomatous polyp of colon, Hyperlipidemia, Hypertension, Legionella infection ([place]) (07/17/2020), Psoriasis, Right lumbar radiculitis, and Vitamin D deficiency.  PSH:    Past Surgical History:  Procedure Laterality Date   APPENDECTOMY  2000   BREAST BIOPSY Right 09/2016   radial scar;COMPLEX SCLEROSING LESION   BREAST LUMPECTOMY Right 11/24/2016   COMPLEX SCLEROSING LESION   BREAST LUMPECTOMY WITH NEEDLE LOCALIZATION Right 11/24/2016   Procedure: BREAST LUMPECTOMY WITH NEEDLE LOCALIZATION;  Surgeon: Leonie Green, MD;  Location: [facility];  Service: General;  Laterality: Right;   COLONOSCOPY WITH PROPOFOL N/A 08/10/2016   Procedure: COLONOSCOPY WITH PROPOFOL;  Surgeon: Manya Silvas, MD;  Location: [facility];  Service: Endoscopy;  Laterality: N/A;   ESOPHAGOGASTRODUODENOSCOPY (EGD) WITH PROPOFOL N/A 08/10/2016   Procedure: ESOPHAGOGASTRODUODENOSCOPY (EGD) WITH PROPOFOL;  Surgeon: Manya Silvas, MD;  Location: [facility];  Service: Endoscopy;  Laterality: N/A;   FOOT SURGERY Bilateral    JOINT REPLACEMENT Right 01/27/2016   hip   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 18 g 10   aspirin EC 81 MG tablet Take 81 mg by mouth daily. Swallow whole.      augmented betamethasone dipropionate (DIPROLENE-AF) 0.05 % ointment APPLY TOPICALLY TO AFFECTED AREA ONCE A DAY. 45 g 10   betamethasone dipropionate (DIPROLENE) 0.05 % ointment Apply topically daily. 45 g 0   BREO ELLIPTA 100-25 MCG/ACT AEPB USE ONE INHALATION BY MOUTH ONCE A DAY. RINSE MOUTH AFTER EACH USE. 60 each 10   Calcipotriene 0.005 % solution APPLY 1 A SMALL AMOUNT TO SCALP ONCE A DAY APPLY TO SCALP DAILY FOR 2 WEEKS, THEN USE ON SCALP 5 DAYS A WEEK 60 mL 0   clobetasol (TEMOVATE) 0.05 % external solution APPLY TOPICALLY TO SCALP AREA TWICE A DAY. 50 mL 10   COSENTYX SENSOREADY, 300 MG, 150 MG/ML SOAJ Inject 2 Syringes into the skin every 28 (twenty-eight) days.     donepezil (ARICEPT) 10 MG tablet Take 10 mg by mouth daily.     folic acid (FOLVITE) 1 MG tablet Take 1 mg by mouth daily.     gabapentin (NEURONTIN) 300 MG capsule TAKE 1 CAPSULE BY MOUTH TWICE A DAY 60 capsule 0   levocetirizine (XYZAL) 5 MG tablet TAKE (1) TABLET BY MOUTH ONCE A DAY IN THE EVENING 30 tablet 10   memantine (NAMENDA) 5 MG tablet Take 1 tablet by mouth daily.     metoprolol succinate (TOPROL-XL) 25 MG 24 hr tablet Take 25 mg by mouth at bedtime.     metoprolol succinate (TOPROL-XL) 25 MG 24 hr tablet Take 1 tablet (25 mg total) by mouth daily. 90 tablet 3   oxyCODONE (OXY IR/ROXICODONE) 5 MG immediate release tablet Take by mouth.     pantoprazole (PROTONIX) 20 MG tablet TAKE 1 TABLET (20 MG TOTAL) BY MOUTH 2 (TWO) TIMES DAILY BEFORE A MEAL. 180 tablet 1   rOPINIRole (REQUIP) 1 MG tablet TAKE 1 TABLET BY MOUTH EVERYDAY AT BEDTIME 90 tablet 0   traMADol (ULTRAM) 50 MG tablet TAKE 1 TABLET BY MOUTH EVERY 6 HOURS AS NEEDED FOR MODERATE OR SEVERE PAIN 100 tablet 2   calcipotriene (DOVONOX) 0.005 % cream APPLY TO SKIN ONCE A DAY APPLY DAILY ON HANDS, FEET AND WRIST X 2 WEEKS, THEN USE 5 DAYS A WEEK (Patient not taking: Reported on 08/19/2021) 60 g 0   Cholecalciferol (VITAMIN D3) 2000 units TABS Take 2,000 Units by  mouth daily. (Patient not taking: Reported on 08/19/2021)     furosemide (LASIX) 40 MG tablet Take 1 tablet (40 mg total) by mouth daily. If weight 127 lb or less, take 20 mg by mouth daily. (Patient not taking: Reported on 02/27/2021) 90 tablet 3   loperamide (IMODIUM) 2 MG capsule Take 1 capsule (2 mg total) by mouth as needed for diarrhea or loose stools. (Patient not taking: Reported on 08/19/2021) 20 capsule 0   No current facility-administered medications for this visit.     Allergies:   Amoxicillin and Clindamycin/lincomycin   Social History:  The patient  reports that she has been smoking cigarettes. She has a 60.00 pack-year smoking history. She has never used smokeless tobacco. She reports current alcohol use. She reports that she does not use drugs.   Family History:   family history includes Arthritis in her father; COPD in her sister; Cancer in her mother; Colon cancer in her paternal aunt, paternal grandmother, and paternal uncle; Fibromyalgia in her sister; Heart disease in her father; Osteoarthritis  in her maternal aunt and sister.    Review of Systems: Review of Systems  Constitutional: Negative.   HENT: Negative.    Respiratory: Negative.    Cardiovascular: Negative.   Gastrointestinal: Negative.   Musculoskeletal: Negative.   Neurological: Negative.   Psychiatric/Behavioral: Negative.    All other systems reviewed and are negative.   PHYSICAL EXAM: VS:  BP 110/74 (BP Location: Left Arm, Patient Position: Sitting, Cuff Size: Normal)   Pulse 88   Ht '5\' 4"'$  (1.626 m)   Wt 135 lb 2 oz (61.3 kg)   SpO2 96%   BMI 23.19 kg/m  , BMI Body mass index is 23.19 kg/m. Constitutional:  oriented to person, place, and time. No distress.  HENT:  Head: Grossly normal Eyes:  no discharge. No scleral icterus.  Neck: No JVD, no carotid bruits  Cardiovascular: Regular rate and rhythm, no murmurs appreciated Pulmonary/Chest: Clear to auscultation bilaterally, no wheezes or  rails Abdominal: Soft.  no distension.  no tenderness.  Musculoskeletal: Normal range of motion Neurological:  normal muscle tone. Coordination normal. No atrophy Skin: Skin warm and dry Psychiatric: normal affect, pleasant  Recent Labs: 02/14/2021: Magnesium 2.0 08/19/2021: ALT 7; BUN 8; Creatinine, Ser 0.77; Hemoglobin 12.8; Platelets 145; Potassium 3.6; Sodium 138    Lipid Panel Lab Results  Component Value Date   CHOL 166 08/20/2020   HDL 56 08/20/2020   LDLCALC 86 08/20/2020   TRIG 141 08/20/2020      Wt Readings from Last 3 Encounters:  01/23/22 135 lb 2 oz (61.3 kg)  12/25/21 134 lb 9.6 oz (61.1 kg)  08/19/21 117 lb 11.2 oz (53.4 kg)     ASSESSMENT AND PLAN:  Problem List Items Addressed This Visit       Cardiology Problems   Essential (primary) hypertension   Peripheral arterial disease (HCC) - Primary   Congestive heart failure (HCC)   Orthostatic hypotension   Multifocal atrial tachycardia (HCC)   Abdominal aortic aneurysm (AAA) ([place])   Other Visit Diagnoses     Hypercholesteremia       Pulmonary hypertension, unspecified ([place])          Chronic diastolic CHF/pulmonary hypertension Stable BMP, appears euvolemic on today's visit Weight is higher but eating more Denies ankle swelling, PND orthopnea, no abdominal distention Recommend he stay on Lasix 40 daily with extra 40 for ankle swelling  Essential hypertension Blood pressure is well controlled on today's visit. No changes made to the medications.  COPD/chronic hypoxic respiratory failure Underlying COPD, pulmonary hypertension, Smoking cessation recommended  History of DVT  August 2021 left posterior tibial vein on ultrasound Previously treated with Eliquis Recommend compression hose, stay active   Total encounter time more than 30 minutes  Greater than 50% was spent in counseling and coordination of care with the patient    Signed, Esmond Plants, M.D., Ph.D. [place], [place]

## 2022-01-23 ENCOUNTER — Other Ambulatory Visit: Payer: Self-pay | Admitting: Family Medicine

## 2022-01-23 ENCOUNTER — Encounter: Payer: Self-pay | Admitting: Cardiovascular Disease

## 2022-01-23 ENCOUNTER — Ambulatory Visit (INDEPENDENT_AMBULATORY_CARE_PROVIDER_SITE_OTHER): Payer: Medicare Other | Admitting: Cardiovascular Disease

## 2022-01-23 VITALS — BP 110/74 | HR 88 | Ht 64.0 in | Wt 135.1 lb

## 2022-01-23 DIAGNOSIS — I10 Essential (primary) hypertension: Secondary | ICD-10-CM

## 2022-01-23 DIAGNOSIS — I272 Pulmonary hypertension, unspecified: Secondary | ICD-10-CM

## 2022-01-23 DIAGNOSIS — I714 Abdominal aortic aneurysm, without rupture, unspecified: Secondary | ICD-10-CM | POA: Diagnosis not present

## 2022-01-23 DIAGNOSIS — I471 Supraventricular tachycardia: Secondary | ICD-10-CM

## 2022-01-23 DIAGNOSIS — I739 Peripheral vascular disease, unspecified: Secondary | ICD-10-CM

## 2022-01-23 DIAGNOSIS — I951 Orthostatic hypotension: Secondary | ICD-10-CM

## 2022-01-23 DIAGNOSIS — I5032 Chronic diastolic (congestive) heart failure: Secondary | ICD-10-CM

## 2022-01-23 DIAGNOSIS — E7800 Pure hypercholesterolemia, unspecified: Secondary | ICD-10-CM | POA: Diagnosis not present

## 2022-01-23 NOTE — Patient Instructions (Signed)
Medication Instructions:  No changes  If you need a refill on your cardiac medications before your next appointment, please call your pharmacy.   Lab work: No new labs needed  Testing/Procedures: No new testing needed  Follow-Up: At [facility], you and your health needs are our priority.  As part of our continuing mission to provide you with exceptional heart care, we have created designated Provider Care Teams.  These Care Teams include your primary Cardiologist (physician) and Advanced Practice Providers (APPs -  Physician Assistants and Nurse Practitioners) who all work together to provide you with the care you need, when you need it.  You will need a follow up appointment in 12 months  Providers on your designated Care Team:   Christopher Berge, NP Ryan Dunn, PA-C Cadence Furth, PA-C  COVID-19 Vaccine Information can be found at: [email or web address] For questions related to vaccine distribution or appointments, please email [email or web address] or call [phone].   

## 2022-01-26 ENCOUNTER — Other Ambulatory Visit: Payer: Medicare Other | Admitting: Primary Care

## 2022-01-26 DIAGNOSIS — Z515 Encounter for palliative care: Secondary | ICD-10-CM

## 2022-01-26 NOTE — Progress Notes (Signed)
  Out with friends, will visit another time.

## 2022-02-17 ENCOUNTER — Other Ambulatory Visit: Payer: Self-pay | Admitting: Family Medicine

## 2022-02-17 NOTE — Telephone Encounter (Signed)
Pharmacy requests Rx be written as scheduled please do not write prn or as needed Medication Refill - Medication:  furosemide (LASIX) 40 MG tablet   Has the patient contacted their pharmacy? Yes.    Preferred Pharmacy (with phone number or street name):  [facility], [place]. Phone:  [phone]  Fax:  [phone]     Has the patient been seen for an appointment in the last year OR does the patient have an upcoming appointment? Yes.    Agent: Please be advised that RX refills may take up to 3 business days. We ask that you follow-up with your pharmacy.

## 2022-02-18 MED ORDER — FUROSEMIDE 40 MG PO TABS: 40.0000 mg | ORAL_TABLET | Freq: Every day | ORAL | 3 refills | Status: DC

## 2022-02-18 NOTE — Telephone Encounter (Signed)
Requested medications are due for refill today.  yes  Requested medications are on the active medications list.  yes  Last refill. 01/16/2021 #90 3 refills  Future visit scheduled.   yes  Notes to clinic.  Missing labs - Mg    Requested Prescriptions  Pending Prescriptions Disp Refills   furosemide (LASIX) 40 MG tablet 90 tablet 3    Sig: Take 1 tablet (40 mg total) by mouth daily. If weight 127 lb or less, take 20 mg by mouth daily.     Cardiovascular:  Diuretics - Loop Failed - 02/17/2022  1:13 PM      Failed - K in normal range and within 180 days    Potassium  Date Value Ref Range Status  08/19/2021 3.6 3.5 - 5.2 mmol/L Final         Failed - Ca in normal range and within 180 days    Calcium  Date Value Ref Range Status  08/19/2021 8.3 (L) 8.7 - 10.3 mg/dL Final  07/25/2018 9.7  Final         Failed - Na in normal range and within 180 days    Sodium  Date Value Ref Range Status  08/19/2021 138 134 - 144 mmol/L Final         Failed - Cr in normal range and within 180 days    Creat  Date Value Ref Range Status  07/15/2017 0.85 0.60 - 0.93 mg/dL Final    Comment:    For patients >22 years of age, the reference limit for Creatinine is approximately 13% higher for people identified as African-American. .    Creatinine, Ser  Date Value Ref Range Status  08/19/2021 0.77 0.57 - 1.00 mg/dL Final         Failed - Cl in normal range and within 180 days    Chloride  Date Value Ref Range Status  08/19/2021 102 96 - 106 mmol/L Final  07/25/2018 97  Final         Failed - Mg Level in normal range and within 180 days    Magnesium  Date Value Ref Range Status  02/14/2021 2.0 1.6 - 2.3 mg/dL Final         Passed - Last BP in normal range    BP Readings from Last 1 Encounters:  01/23/22 110/74         Passed - Valid encounter within last 6 months    Recent Outpatient Visits           1 month ago Essential (primary) hypertension   [employer], Dionne Bucy, MD   6 months ago Encounter for examination for admission to assisted living facility   [facility], Jake Church, DO   1 year ago [place], Dionne Bucy, MD   1 year ago Encounter for annual wellness visit (AWV) in Medicare patient   [facility] [place], Dionne Bucy, MD   1 year ago Syncope, unspecified syncope type   [facility], Dionne Bucy, MD       Future Appointments             In 4 months Bacigalupo, Dionne Bucy, MD [facility]

## 2022-03-06 ENCOUNTER — Ambulatory Visit: Payer: Self-pay

## 2022-03-06 NOTE — Chronic Care Management (AMB) (Signed)
   03/06/2022  Mariah [name] November 14, 1945 [id number]  Documentation encounter created to complete case transition. The care management team will follow for care coordination as needed.   [facility] Management [phone]

## 2022-03-12 DIAGNOSIS — G3184 Mild cognitive impairment, so stated: Secondary | ICD-10-CM | POA: Diagnosis not present

## 2022-03-27 ENCOUNTER — Other Ambulatory Visit: Payer: Medicare Other | Admitting: Primary Care

## 2022-03-30 ENCOUNTER — Telehealth: Payer: Self-pay

## 2022-03-30 NOTE — Patient Outreach (Signed)
  Care Coordination   03/30/2022 Name: Mariah [name] MRN: [id number] DOB: 1945-08-27   Care Coordination Outreach Attempts:  An unsuccessful telephone outreach was attempted today to offer the patient information about available care coordination services as a benefit of their health plan.   Follow Up Plan:  Additional outreach attempts will be made to offer the patient care coordination information and services.   Encounter Outcome:  No Answer  Care Coordination Interventions Activated:  No   Care Coordination Interventions:  No, not indicated    Noreene Larsson RN, MSN, CCM Community Care Coordinator [facility] Mobile: [phone]

## 2022-04-01 ENCOUNTER — Other Ambulatory Visit: Payer: Self-pay | Admitting: Family Medicine

## 2022-04-01 DIAGNOSIS — Z1231 Encounter for screening mammogram for malignant neoplasm of breast: Secondary | ICD-10-CM

## 2022-04-24 ENCOUNTER — Ambulatory Visit
Admission: RE | Admit: 2022-04-24 | Discharge: 2022-04-24 | Disposition: A | Discharge status: Home / Self Care | Payer: Medicare Other | Source: Ambulatory Visit | Attending: Family Medicine | Admitting: Family Medicine

## 2022-04-24 DIAGNOSIS — Z1231 Encounter for screening mammogram for malignant neoplasm of breast: Secondary | ICD-10-CM | POA: Diagnosis not present

## 2022-05-18 ENCOUNTER — Encounter (INDEPENDENT_AMBULATORY_CARE_PROVIDER_SITE_OTHER): Payer: Self-pay

## 2022-06-04 ENCOUNTER — Telehealth: Payer: Self-pay

## 2022-06-04 NOTE — Telephone Encounter (Signed)
Copied from [place] [id number]. Topic: General - Other >> Jun 04, 2022  4:24 PM Chapman Fitch wrote: Reason for CRM: DSS sent pts Brother in law a letter that they need an updated FL2 form / please advise when ready to pick up / this is needed before the 11.21.23

## 2022-06-04 NOTE — Telephone Encounter (Signed)
I think you have done an FL2 for her before. Do you still have a copy that could just be updated for the facility?

## 2022-06-08 ENCOUNTER — Other Ambulatory Visit: Payer: Medicare Other | Admitting: Primary Care

## 2022-06-08 DIAGNOSIS — Z515 Encounter for palliative care: Secondary | ICD-10-CM

## 2022-06-08 DIAGNOSIS — R413 Other amnesia: Secondary | ICD-10-CM | POA: Diagnosis not present

## 2022-06-08 NOTE — Progress Notes (Signed)
[facility] Palliative Care Consult Note Telephone: [phone]  Fax: [phone]    Date of encounter: 06/08/22 10:16 AM PATIENT NAME: Mariah [name] Mariah [name]   [phone] (home)  DOB: March 23, 1946 MRN: [id number] PRIMARY CARE PROVIDER:    [name], [name],  [address] [place] [address] [phone]  REFERRING PROVIDER:   [name], [name],  [address] [place] [address] [place] [address] [phone]  RESPONSIBLE PARTY:    Contact Information     Name Relation Home Work Mobile   Mariah [name] Niece   [phone]        I met face to face with patient in [facility]. Palliative Care was asked to follow this patient by consultation request of  Bacigalupo, Mariah Bucy, [name] to address advance care planning and complex medical decision making. This is a follow up visit.                                   ASSESSMENT AND PLAN / RECOMMENDATIONS:   Advance Care Planning/Goals of Care: Goals include to maximize quality of life and symptom management. Patient/health care surrogate gave his/her permission to discuss. Our advance care planning conversation included a discussion about:    The value and importance of advance care planning  Experiences with loved ones who have been seriously ill or have died  Exploration of personal, cultural or spiritual beliefs that might influence medical decisions  Exploration of goals of care in the event of a sudden injury or illness  Identification of a healthcare agent - brother in law States brother in law is remarrying and she is not sure what he is doing with possessions. This is causing her anxiety.  We discussed her POA and his decisions. She is in touch with her niece as well, and she will go there for thanksgiving  CODE STATUS: Full Has ACP on file (living will)  Symptom  Management/Plan:  Nutrition: Eating well per her report. Weight appears stable, but not available. Please monitor weight.  Mobility; Using cane, denies falls. Appears to be safe with her ambulation in side and out side building.  Mood; Endorses some anxiety over her belongings, and her POA who is her sister's widower and now remarrying.  Discussed how she does feel she forgets things but overall she feels her memory is doing ok.  Pain; Denies pain and endorses good sleep.  Follow up Palliative Care Visit: Palliative care will continue to follow for complex medical decision making, advance care planning, and clarification of goals. Return 8 weeks or prn.  I spent 40 minutes providing this consultation. More than 50% of the time in this consultation was spent in counseling and care coordination.  PPS: 40%  HOSPICE ELIGIBILITY/DIAGNOSIS: TBD  Chief Complaint: debility  HISTORY OF PRESENT ILLNESS:  Mariah [name] is a 76 y.o. year old female  with debility, memory loss .   History obtained from review of EMR, discussion with primary team, and interview with family, facility staff/caregiver and/or Mariah [name].  I reviewed available labs, medications, imaging, studies and related documents from the EMR.  Records reviewed and summarized above.   ROS   General: NAD EYES: denies vision changes ENMT: denies dysphagia Cardiovascular: denies chest pain, denies DOE Pulmonary: denies cough, denies increased SOB Abdomen: endorses good appetite, denies constipation,  endorses continence of bowel GU: denies dysuria, endorses continence of urine MSK:  denies increased weakness,  no falls reported Skin: denies rashes or wounds Neurological: denies pain, denies insomnia Psych: Endorses positive mood,  endorses short term memory loss  Heme/lymph/immuno: denies bruises, abnormal bleeding  Physical Exam: Current and past weights: unavailabe Constitutional: NAD General: frail appearing, thin EYES:  anicteric sclera, lids intact, no discharge  ENMT: intact hearing, oral mucous membranes moist, dentition intact CV: no LE edema Pulmonary:  no increased work of breathing, no cough, room air Abdomen: intake 80%, soft and non tender, no ascites GU: deferred MSK: + sarcopenia, moves all extremities, ambulatory Skin: warm and dry, no rashes or wounds on visible skin Neuro:  +generalized weakness,  + cognitive impairment Psych: non-anxious affect, A and O x 2 Hem/lymph/immuno: no widespread bruising   Thank you for the opportunity to participate in the care of Mariah [name]. Please call our office at [phone] if we can be of additional assistance.   Mariah Coop, NP   COVID-19 PATIENT SCREENING TOOL Asked and negative response unless otherwise noted:   Have you had symptoms of covid, tested positive or been in contact with someone with symptoms/positive test in the past 5-10 days?

## 2022-06-09 ENCOUNTER — Other Ambulatory Visit: Payer: Medicare Other | Admitting: Primary Care

## 2022-06-09 NOTE — Telephone Encounter (Signed)
Called again asking about the [facility] form and if it was ready to be picked up.    He said he needs it asap. Please call when ready.  [phone]

## 2022-06-09 NOTE — Telephone Encounter (Signed)
I have not met Mariah [name].

## 2022-06-10 ENCOUNTER — Telehealth: Payer: Self-pay

## 2022-06-10 NOTE — Telephone Encounter (Signed)
Per Donald Siva, RN Good Morning! Just wanted to give you an update. I just spoke with Mariah [name] caregiver/R. Bennett. I'll email the form to you shortly.

## 2022-06-14 NOTE — Telephone Encounter (Signed)
  Care Management   Visit Note   Name: Mariah [name] MRN: [id number] DOB: Jun 14, 1946  Subjective: Mariah [name] is a 76 y.o. year old female who is a primary care patient of Bacigalupo, Dionne Bucy, MD. The Care Management team was consulted for assistance.      Engaged with patient's caregiver  Mariah [name] by telephone.  Assessment:  Mariah [name] is currently residing at [facility]. Facility staff has requested an updated FL-2.  Interventions:  Evaluation of current plan to continue residence at [facility]. Reviewed medications and current treatment plans. Reviewed patient's functional status and ability to complete ADLs with caregiver Mariah. Collaborated with Mariah [name] at [facility] regarding need for updated FL-2  Recommendations:    Updated FL-2 submitted for PCP review and signature. Facility staff will call if additional documentation is required.  Consent to Services:  Patient's caregiver was given information about care management services, agreed to services, and gave verbal consent. Agreed to contact the clinic for assistance as needed.  PLAN The care management team will provide additional assistance as needed.   Horris Latino RN Care Manager/Chronic Care Management [phone]

## 2022-06-30 ENCOUNTER — Encounter: Payer: Self-pay | Admitting: Nurse Practitioner

## 2022-06-30 ENCOUNTER — Non-Acute Institutional Stay: Payer: Medicare Other | Admitting: Nurse Practitioner

## 2022-06-30 DIAGNOSIS — R0602 Shortness of breath: Secondary | ICD-10-CM

## 2022-06-30 DIAGNOSIS — I509 Heart failure, unspecified: Secondary | ICD-10-CM | POA: Diagnosis not present

## 2022-06-30 DIAGNOSIS — G309 Alzheimer's disease, unspecified: Secondary | ICD-10-CM

## 2022-06-30 DIAGNOSIS — R5381 Other malaise: Secondary | ICD-10-CM

## 2022-06-30 DIAGNOSIS — Z515 Encounter for palliative care: Secondary | ICD-10-CM | POA: Diagnosis not present

## 2022-06-30 NOTE — Progress Notes (Signed)
[facility] Consult Note Telephone: [phone]  Fax: [phone]    Date of encounter: 06/30/22 5:12 PM PATIENT NAME: Mariah [name] [place] [address]   [phone] (home)  DOB: 1945/09/21 MRN: [id number] PRIMARY CARE PROVIDER:   [place], [place], [place],  [address] [place] [address] [phone]  RESPONSIBLE PARTY:    Contact Information     Name Relation Home Work Mobile   [name] Relative [place] Niece   [phone]     I met face to face with patient in [facility]. Palliative Care was asked to follow this patient by consultation request of  Bacigalupo, Dionne Bucy, MD to address advance care planning and complex medical decision making. This is a follow up visit.                                  ASSESSMENT AND PLAN / RECOMMENDATIONS:  1. Advance Care Planning;  Full code 2. Goals of Care: Goals include to maximize quality of life and symptom management. Our advance care planning conversation included a discussion about:    The value and importance of advance care planning  Exploration of personal, cultural or spiritual beliefs that might influence medical decisions  Exploration of goals of care in the event of a sudden injury or illness  Identification and preparation of a healthcare agent  Review and updating or creation of an advance directive document. 3. Palliative care encounter; Palliative care encounter; Palliative medicine team will continue to support patient, patient's family, and medical team.  4. Debility secondary to alzheimer's dementia, stable, continue to use can, fall precautions reviewed. No new changes 5. Shortness of breath secondary to CHF, stable, continue to monitor respiratory status, weights, edema, medications.  Visit consisted of counseling and education dealing with the complex and emotionally intense  issues of symptom management and palliative care in the setting of serious and potentially life-threatening illness  Follow up Palliative Care Visit: Palliative care will continue to follow for complex medical decision making, advance care planning, and clarification of goals. Return 4 to 8 weeks or prn.   PPS: 40%   Chief Complaint: Follow up palliative consult for complex medical decision making, address goals, manage ongoing symptoms   HISTORY OF PRESENT ILLNESS:  Mariah [name] is a 76 y.o. year old female  with multiple medial problems including Alzheimer disease, CHF, HTN, Atrial tachycardia, PAD, h/o DVT, abdominal aortic aneurysm, COPD, GERD, psoriatic arthritic, arthritis, CKD, RLS, protein calorie malnutrition. I visited Mariah [name] sitting at the dining room table in the dining hall at [employer]. We talked about purpose of PC visit, ros, functional abilities, sleep patterns, appetite with no noted weight loss. We talked about appointment with Dr Brita Romp later today. We talked about ambulating with cane, no recent falls per Mariah [name]. We talked about daily routine, quality of life, what brings her joy. Medical goals, medial poc, medications reviewed. Support provided. No new changes recommended today.   I spent 45 minutes providing this consultation.. More than 50% of the time in this consultation was spent in counseling and care coordination.  History obtained from review of EMR, discussion with primary team, and interview with family, facility staff/caregiver and/or Mariah [name].  I reviewed available labs, medications, imaging, studies and related documents from the EMR.  Records reviewed and summarized  above.    ROS 10 point system reviewed all negative except HPI Physical Exam: Constitutional: NAD General: frail appearing, thin, pleasant female EYES:  lids intact ENMT: oral mucous membranes moist CV: RRR Pulmonary:  no increased work of breathing, no cough, room  air Abdomen: soft and non tender, no ascites MSK: ambulatory with cane Skin: warm and dry Neuro:  +generalized weakness,  + cognitive impairment Psych: non-anxious affect, A and O x 2  Thank you for the opportunity to participate in the care of Mariah [name]. Please call our office at [phone] if we can be of additional assistance.   Tahisha Hakim Ihor Gully, NP

## 2022-06-30 NOTE — Progress Notes (Unsigned)
I,Nisha Dhami S Daking Westervelt,acting as a Education administrator for Lavon Paganini, MD.,have documented all relevant documentation on the behalf of Lavon Paganini, MD,as directed by  Lavon Paganini, MD while in the presence of Lavon Paganini, MD.    Annual Wellness Visit     Patient: Mariah [name], Female    DOB: 10/05/1945, 76 y.o.   MRN: [id number] Visit Date: 07/02/2022  Today's Provider: Lavon Paganini, MD   No chief complaint on file.  Subjective    Mariah [name] is a 76 y.o. female who presents today for her Annual Wellness Visit. She reports consuming a {diet types:17450} diet. {Exercise:19826} She generally feels {well/fairly well/poorly:18703}. She reports sleeping {well/fairly well/poorly:18703}. She {does/does not:200015} have additional problems to discuss today.   HPI    Medications: Outpatient Medications Prior to Visit  Medication Sig   albuterol (VENTOLIN HFA) 108 (90 Base) MCG/ACT inhaler USE 2 PUFFS BY MOUTH EVERY 6 HOURS AS NEEDED FOR SHORTNESS OF BREATH.   aspirin EC 81 MG tablet Take 81 mg by mouth daily. Swallow whole.   augmented betamethasone dipropionate (DIPROLENE-AF) 0.05 % ointment APPLY TOPICALLY TO AFFECTED AREA ONCE A DAY.   BREO ELLIPTA 100-25 MCG/ACT AEPB USE ONE INHALATION BY MOUTH ONCE A DAY. RINSE MOUTH AFTER EACH USE.   calcipotriene (DOVONOX) 0.005 % cream APPLY TO SKIN ONCE A DAY APPLY DAILY ON HANDS, FEET AND WRIST X 2 WEEKS, THEN USE 5 DAYS A WEEK (Patient not taking: Reported on 08/19/2021)   Calcipotriene 0.005 % solution APPLY 1 A SMALL AMOUNT TO SCALP ONCE A DAY APPLY TO SCALP DAILY FOR 2 WEEKS, THEN USE ON SCALP 5 DAYS A WEEK   Cholecalciferol (VITAMIN D3) 2000 units TABS Take 2,000 Units by mouth daily. (Patient not taking: Reported on 08/19/2021)   clobetasol (TEMOVATE) 0.05 % external solution APPLY TOPICALLY TO SCALP AREA TWICE A DAY.   COSENTYX SENSOREADY, 300 MG, 150 MG/ML SOAJ Inject 2 Syringes into the skin every 28 (twenty-eight) days.    donepezil (ARICEPT) 10 MG tablet Take 10 mg by mouth daily.   folic acid (FOLVITE) 1 MG tablet Take 1 mg by mouth daily.   furosemide (LASIX) 40 MG tablet Take 1 tablet (40 mg total) by mouth daily. If weight 127 lb or less, take 20 mg by mouth daily.   gabapentin (NEURONTIN) 300 MG capsule TAKE 1 CAPSULE BY MOUTH TWICE A DAY   levocetirizine (XYZAL) 5 MG tablet TAKE (1) TABLET BY MOUTH ONCE A DAY IN THE EVENING   memantine (NAMENDA) 5 MG tablet Take 1 tablet by mouth daily.   metoprolol succinate (TOPROL-XL) 25 MG 24 hr tablet Take 1 tablet (25 mg total) by mouth daily.   oxyCODONE (OXY IR/ROXICODONE) 5 MG immediate release tablet Take by mouth.   pantoprazole (PROTONIX) 20 MG tablet TAKE 1 TABLET (20 MG TOTAL) BY MOUTH 2 (TWO) TIMES DAILY BEFORE A MEAL.   rOPINIRole (REQUIP) 1 MG tablet TAKE 1 TABLET BY MOUTH EVERYDAY AT BEDTIME   traMADol (ULTRAM) 50 MG tablet TAKE 1 TABLET BY MOUTH EVERY 6 HOURS AS NEEDED FOR MODERATE OR SEVERE PAIN   No facility-administered medications prior to visit.    Allergies  Allergen Reactions   Amoxicillin Diarrhea   Clindamycin/Lincomycin     diarrhea    Patient Care Team: [name], MD as PCP - General (Family Medicine) Hooten, Laurice Record, MD as Consulting Physician (Orthopedic Surgery) Lucky Cowboy Erskine Squibb, MD as Referring Physician (Vascular Surgery) Marlowe Sax, MD as Referring Physician (Internal Medicine)  Germaine Pomfret, [place] (Pharmacist) Verl Blalock, NP as Nurse Practitioner (Adult Health Nurse Practitioner) Kateri Mc, Pauline Aus, NP as Nurse Practitioner (Nurse Practitioner)  Review of Systems  All other systems reviewed and are negative.   Last CBC Lab Results  Component Value Date   WBC 4.1 08/19/2021   HGB 12.8 08/19/2021   HCT 37.3 08/19/2021   MCV 91 08/19/2021   MCH 31.2 08/19/2021   RDW 12.9 08/19/2021   PLT 145 (L) 81/85/6314   Last metabolic panel Lab Results  Component Value Date   GLUCOSE 96  08/19/2021   NA 138 08/19/2021   K 3.6 08/19/2021   CL 102 08/19/2021   CO2 24 08/19/2021   BUN 8 08/19/2021   CREATININE 0.77 08/19/2021   EGFR 80 08/19/2021   CALCIUM 8.3 (L) 08/19/2021   PHOS 3.1 01/11/2021   PROT 5.7 (L) 08/19/2021   ALBUMIN 3.1 (L) 08/19/2021   LABGLOB 2.6 08/19/2021   AGRATIO 1.2 08/19/2021   BILITOT 0.4 08/19/2021   ALKPHOS 212 (H) 08/19/2021   AST 17 08/19/2021   ALT 7 08/19/2021   ANIONGAP 8 03/26/2021   Last lipids Lab Results  Component Value Date   CHOL 166 08/20/2020   HDL 56 08/20/2020   LDLCALC 86 08/20/2020   TRIG 141 08/20/2020   CHOLHDL 3.0 08/20/2020   Last hemoglobin A1c Lab Results  Component Value Date   HGBA1C 5.5 08/08/2019   Last thyroid functions Lab Results  Component Value Date   TSH 2.060 01/11/2021        Objective    Vitals: There were no vitals taken for this visit. BP Readings from Last 3 Encounters:  01/23/22 110/74  12/25/21 106/76  08/19/21 (!) 149/82   Wt Readings from Last 3 Encounters:  01/23/22 135 lb 2 oz (61.3 kg)  12/25/21 134 lb 9.6 oz (61.1 kg)  08/19/21 117 lb 11.2 oz (53.4 kg)       Physical Exam ***  Most recent functional status assessment:    08/19/2021    2:46 PM  In your present state of health, do you have any difficulty performing the following activities:  Hearing? 1  Vision? 1  Difficulty concentrating or making decisions? 1  Walking or climbing stairs? 1  Dressing or bathing? 1  Doing errands, shopping? 1   Most recent fall risk assessment:    08/19/2021    2:46 PM  Cuming in the past year? 1  Number falls in past yr: 0  Injury with Fall? 0  Risk for fall due to : Impaired mobility  Follow up Falls evaluation completed    Most recent depression screenings:    08/19/2021    2:46 PM 02/13/2021    1:41 PM  PHQ 2/9 Scores  PHQ - 2 Score 4 0  PHQ- 9 Score 13 3   Most recent cognitive screening:    11/18/2020    2:14 PM  6CIT Screen  What Year? 0  points  What month? 3 points  What time? 0 points  Count back from 20 0 points  Months in reverse 0 points  Repeat phrase 2 points  Total Score 5 points   Most recent Audit-C alcohol use screening    08/19/2021    2:45 PM  Alcohol Use Disorder Test (AUDIT)  1. How often do you have a drink containing alcohol? 0  2. How many drinks containing alcohol do you have on a typical day when you are  drinking? 0  3. How often do you have six or more drinks on one occasion? 0  AUDIT-C Score 0   A score of 3 or more in women, and 4 or more in men indicates increased risk for alcohol abuse, EXCEPT if all of the points are from question 1   No results found for any visits on 07/02/22.  Assessment & Plan     Annual wellness visit done today including the all of the following: Reviewed patient's Family Medical History Reviewed and updated list of patient's medical providers Assessment of cognitive impairment was done Assessed patient's functional ability Established a written schedule for health screening [place] Completed and Reviewed  Exercise Activities and Dietary recommendations  Goals      Manage My Emotions     Timeframe:  Long-Range Goal Priority:  Medium Start Date:   10/31/20                          Expected End Date:   01/02/21                 Follow Up Date: 02/20/22    -follow up on programs and services through the Palliative Care program   Why is this important?   When you are stressed, down or upset, your body reacts too.  For example, your blood pressure may get higher; you may have a headache or stomachache.  When your emotions get the best of you, your body's ability to fight off cold and flu gets weak.  These steps will help you manage your emotions.         Quit smoking / using tobacco     Recommend to quit smoking. Start by tapering down until completely quit.          Immunization History  Administered Date(s) Administered    Fluad Quad(high Dose 65+) 04/04/2020, 08/19/2021   Influenza, High Dose Seasonal PF 06/24/2015, 03/16/2016, 07/15/2017, 06/05/2018, 07/12/2019   Influenza-Unspecified 06/01/2018   PFIZER Comirnaty(Gray Top)Covid-19 Tri-Sucrose Vaccine 10/23/2020   PFIZER(Purple Top)SARS-COV-2 Vaccination 07/30/2020, 08/20/2020   Pneumococcal Conjugate-13 06/24/2015   Pneumococcal Polysaccharide-23 03/26/2011   Tdap 03/26/2011    Health Maintenance  Topic Date Due   Zoster Vaccines- Shingrix (1 of 2) Never done   DEXA SCAN  08/08/2020   DTaP/Tdap/Td (2 - Td or Tdap) 03/25/2021   Lung Cancer Screening  06/02/2021   COLONOSCOPY (Pts 45-72yr Insurance coverage will need to be confirmed)  08/10/2021   INFLUENZA VACCINE  02/17/2022   COVID-19 Vaccine (4 - 2023-24 season) 03/20/2022   Pneumonia Vaccine 76 Years old  Completed   Hepatitis C Screening  Completed   HPV VACCINES  Aged Out     Discussed health benefits of physical activity, and encouraged her to engage in regular exercise appropriate for her age and condition.    ***  No follow-ups on file.     {provider attestation***:1}   ALavon Paganini MD  [phone](phone) [phone](fax)  [place]

## 2022-07-02 ENCOUNTER — Ambulatory Visit (INDEPENDENT_AMBULATORY_CARE_PROVIDER_SITE_OTHER): Payer: Medicare Other | Admitting: Family Medicine

## 2022-07-02 ENCOUNTER — Encounter: Payer: Self-pay | Admitting: Family Medicine

## 2022-07-02 VITALS — BP 115/79 | HR 56 | Temp 98.7°F | Resp 16 | Ht 61.0 in | Wt 137.0 lb

## 2022-07-02 DIAGNOSIS — M810 Age-related osteoporosis without current pathological fracture: Secondary | ICD-10-CM

## 2022-07-02 DIAGNOSIS — E559 Vitamin D deficiency, unspecified: Secondary | ICD-10-CM | POA: Diagnosis not present

## 2022-07-02 DIAGNOSIS — F17200 Nicotine dependence, unspecified, uncomplicated: Secondary | ICD-10-CM

## 2022-07-02 DIAGNOSIS — I10 Essential (primary) hypertension: Secondary | ICD-10-CM | POA: Diagnosis not present

## 2022-07-02 DIAGNOSIS — D696 Thrombocytopenia, unspecified: Secondary | ICD-10-CM | POA: Diagnosis not present

## 2022-07-02 DIAGNOSIS — Z23 Encounter for immunization: Secondary | ICD-10-CM | POA: Diagnosis not present

## 2022-07-02 DIAGNOSIS — R739 Hyperglycemia, unspecified: Secondary | ICD-10-CM | POA: Diagnosis not present

## 2022-07-02 DIAGNOSIS — Z0000 Encounter for general adult medical examination without abnormal findings: Secondary | ICD-10-CM

## 2022-07-02 DIAGNOSIS — Z122 Encounter for screening for malignant neoplasm of respiratory organs: Secondary | ICD-10-CM

## 2022-07-02 DIAGNOSIS — F17210 Nicotine dependence, cigarettes, uncomplicated: Secondary | ICD-10-CM | POA: Diagnosis not present

## 2022-07-02 DIAGNOSIS — N1832 Chronic kidney disease, stage 3b: Secondary | ICD-10-CM | POA: Diagnosis not present

## 2022-07-02 DIAGNOSIS — E785 Hyperlipidemia, unspecified: Secondary | ICD-10-CM

## 2022-07-02 DIAGNOSIS — I129 Hypertensive chronic kidney disease with stage 1 through stage 4 chronic kidney disease, or unspecified chronic kidney disease: Secondary | ICD-10-CM

## 2022-07-02 NOTE — Assessment & Plan Note (Signed)
Well controlled Continue current medications Recheck metabolic panel F/u in 6 months  

## 2022-07-02 NOTE — Assessment & Plan Note (Signed)
Reviewed last lipid panel Not currently on a statin Recheck FLP and CMP Discussed diet and exercise  

## 2022-07-02 NOTE — Assessment & Plan Note (Signed)
Encourage cessation - patient precontemplative Lung cancer screening

## 2022-07-02 NOTE — Assessment & Plan Note (Signed)
Recheck DEXA 

## 2022-07-02 NOTE — Assessment & Plan Note (Signed)
Stable Recheck CBC

## 2022-07-02 NOTE — Assessment & Plan Note (Signed)
Chronic and stable Recheck metabolic panel Avoid nephrotoxic meds  

## 2022-07-03 LAB — LIPID PANEL WITH LDL/HDL RATIO
Cholesterol, Total: 232 mg/dL — ABNORMAL HIGH (ref 100–199)
HDL: 55 mg/dL (ref >39)
LDL Chol Calc (NIH): 144 mg/dL — ABNORMAL HIGH (ref 0–99)
LDL/HDL Ratio: 2.6 ratio (ref 0.0–3.2)
Triglycerides: 183 mg/dL — ABNORMAL HIGH (ref 0–149)
VLDL Cholesterol Cal: 33 mg/dL (ref 5–40)

## 2022-07-03 LAB — CBC
Hematocrit: 44.4 % (ref 34.0–46.6)
Hemoglobin: 15.3 g/dL (ref 11.1–15.9)
MCH: 32.2 pg (ref 26.6–33.0)
MCHC: 34.5 g/dL (ref 31.5–35.7)
MCV: 94 fL (ref 79–97)
Platelets: 154 10*3/uL (ref 150–450)
RBC: 4.75 x10E6/uL (ref 3.77–5.28)
RDW: 12.1 % (ref 11.7–15.4)
WBC: 5.7 10*3/uL (ref 3.4–10.8)

## 2022-07-03 LAB — COMPREHENSIVE METABOLIC PANEL
ALT: 18 IU/L (ref 0–32)
AST: 27 IU/L (ref 0–40)
Albumin/Globulin Ratio: 1.6 (ref 1.2–2.2)
Albumin: 4.6 g/dL (ref 3.8–4.8)
Alkaline Phosphatase: 181 IU/L — ABNORMAL HIGH (ref 44–121)
BUN/Creatinine Ratio: 14 (ref 12–28)
BUN: 17 mg/dL (ref 8–27)
Bilirubin Total: 0.4 mg/dL (ref 0.0–1.2)
CO2: 29 mmol/L (ref 20–29)
Calcium: 9.6 mg/dL (ref 8.7–10.3)
Chloride: 96 mmol/L (ref 96–106)
Creatinine, Ser: 1.23 mg/dL — ABNORMAL HIGH (ref 0.57–1.00)
Globulin, Total: 2.9 g/dL (ref 1.5–4.5)
Glucose: 90 mg/dL (ref 70–99)
Potassium: 4.1 mmol/L (ref 3.5–5.2)
Sodium: 141 mmol/L (ref 134–144)
Total Protein: 7.5 g/dL (ref 6.0–8.5)
eGFR: 45 mL/min/{1.73_m2} — ABNORMAL LOW (ref >59)

## 2022-07-03 LAB — VITAMIN D 25 HYDROXY (VIT D DEFICIENCY, FRACTURES): Vit D, 25-Hydroxy: 11.5 ng/mL — ABNORMAL LOW (ref 30.0–100.0)

## 2022-07-03 LAB — HEMOGLOBIN A1C
Est. average glucose Bld gHb Est-mCnc: 108 mg/dL
Hgb A1c MFr Bld: 5.4 % (ref 4.8–5.6)

## 2022-07-06 ENCOUNTER — Telehealth: Payer: Self-pay

## 2022-07-06 DIAGNOSIS — I10 Essential (primary) hypertension: Secondary | ICD-10-CM

## 2022-07-06 NOTE — Telephone Encounter (Signed)
-----   Message from [name], MD sent at 07/06/2022  8:09 AM EST ----- Normal/stable labs, except for slightly worse kidney function. Wonder about dehydration.  Recommend hydrating well and avoiding NSAIDs. Recheck in 1 month.  Vit D level is low.  Recommend starting OTC Vit D3 1000-2000 units daily.

## 2022-08-05 DIAGNOSIS — I10 Essential (primary) hypertension: Secondary | ICD-10-CM | POA: Diagnosis not present

## 2022-08-06 LAB — COMPREHENSIVE METABOLIC PANEL
ALT: 14 IU/L (ref 0–32)
AST: 25 IU/L (ref 0–40)
Albumin/Globulin Ratio: 1.6 (ref 1.2–2.2)
Albumin: 4.4 g/dL (ref 3.8–4.8)
Alkaline Phosphatase: 193 IU/L — ABNORMAL HIGH (ref 44–121)
BUN/Creatinine Ratio: 14 (ref 12–28)
BUN: 19 mg/dL (ref 8–27)
Bilirubin Total: 0.4 mg/dL (ref 0.0–1.2)
CO2: 24 mmol/L (ref 20–29)
Calcium: 9.3 mg/dL (ref 8.7–10.3)
Chloride: 99 mmol/L (ref 96–106)
Creatinine, Ser: 1.38 mg/dL — ABNORMAL HIGH (ref 0.57–1.00)
Globulin, Total: 2.8 g/dL (ref 1.5–4.5)
Glucose: 130 mg/dL — ABNORMAL HIGH (ref 70–99)
Potassium: 4.5 mmol/L (ref 3.5–5.2)
Sodium: 142 mmol/L (ref 134–144)
Total Protein: 7.2 g/dL (ref 6.0–8.5)
eGFR: 39 mL/min/{1.73_m2} — ABNORMAL LOW (ref >59)

## 2022-08-07 ENCOUNTER — Telehealth: Payer: Self-pay

## 2022-08-07 DIAGNOSIS — I10 Essential (primary) hypertension: Secondary | ICD-10-CM

## 2022-08-07 DIAGNOSIS — N1832 Chronic kidney disease, stage 3b: Secondary | ICD-10-CM

## 2022-08-07 NOTE — Telephone Encounter (Signed)
-----  Message from [name], MD sent at 08/06/2022  8:50 AM EST ----- Kidney function is worse. Decrease furosemide to 20 mg daily, hydrate well, no NSAIDs (ibuprofen, aleve, etc), and refer to Nephrology (kidney doctors). CMAs ok to place order for referral if patient agrees. Recheck BMP in 1 wk.

## 2022-08-10 ENCOUNTER — Telehealth: Payer: Self-pay

## 2022-08-10 NOTE — Telephone Encounter (Signed)
Copied from [place] [phone]. Topic: General - Other >> Aug 10, 2022 10:29 AM Oley Balm A wrote: Reason for CRM: Elmyra Ricks states that he spoke to the nurse last week regarding the patients lab work and gave the information to the patients facility ([facility]) and they are needing something in writing showing the lab results before they can change anything.   [facility] Phone Number: [phone]

## 2022-08-11 NOTE — Telephone Encounter (Signed)
So do they just want her last labs faxed to them? That can go through medical records

## 2022-08-13 MED ORDER — FUROSEMIDE 20 MG PO TABS: 20.0000 mg | ORAL_TABLET | Freq: Every day | ORAL | 1 refills | Status: DC

## 2022-08-13 NOTE — Addendum Note (Signed)
Addended by: Shawna Orleans on: 08/13/2022 03:11 PM   Modules accepted: Orders

## 2022-08-13 NOTE — Telephone Encounter (Signed)
Pls send lower dose script of below furosemide (LASIX) 40 MG tablet  lowered to 20 mg To pt facility at [facility] to their fax [phone]. Staff stating they will forward to [facility]. Chg was to be 1/18

## 2022-08-13 NOTE — Telephone Encounter (Signed)
Spoke to Tammy at [facility] and advised that I would go ahead and send a new prescription into the pharmacy with new dose.

## 2022-08-20 ENCOUNTER — Encounter: Payer: Self-pay | Admitting: Nurse Practitioner

## 2022-08-20 ENCOUNTER — Non-Acute Institutional Stay: Payer: 59 | Admitting: Nurse Practitioner

## 2022-08-20 DIAGNOSIS — R5381 Other malaise: Secondary | ICD-10-CM | POA: Diagnosis not present

## 2022-08-20 DIAGNOSIS — Z515 Encounter for palliative care: Secondary | ICD-10-CM | POA: Diagnosis not present

## 2022-08-20 DIAGNOSIS — G309 Alzheimer's disease, unspecified: Secondary | ICD-10-CM

## 2022-08-20 DIAGNOSIS — R0602 Shortness of breath: Secondary | ICD-10-CM | POA: Diagnosis not present

## 2022-08-20 DIAGNOSIS — I509 Heart failure, unspecified: Secondary | ICD-10-CM

## 2022-08-20 NOTE — Progress Notes (Addendum)
[facility] Consult Note Telephone: [phone]  Fax: [phone]    Date of encounter: 08/20/22 1:39 PM PATIENT NAME: Mariah [name] [place] [address]   [phone] (home)  DOB: 07/19/1946 MRN: [id number] PRIMARY CARE PROVIDER:    [name], MD,  [address] [place] [address] [place] [address] [phone]  RESPONSIBLE PARTY:    Contact Information     Name Relation Home Work Mobile   [place] Relative [place] Niece   [phone]     I met face to face with patient in [facility]. Palliative Care was asked to follow this patient by consultation request of  Bacigalupo, Dionne Bucy, MD to address advance care planning and complex medical decision making. This is a follow up visit.                                  ASSESSMENT AND PLAN / RECOMMENDATIONS:  1. Advance Care Planning;  Full code 2. Goals of Care: Goals include to maximize quality of life and symptom management. Our advance care planning conversation included a discussion about:    The value and importance of advance care planning  Exploration of personal, cultural or spiritual beliefs that might influence medical decisions  Exploration of goals of care in the event of a sudden injury or illness  Identification and preparation of a healthcare agent  Review and updating or creation of an advance directive document. 3. Palliative care encounter; Palliative care encounter; Palliative medicine team will continue to support patient, patient's family, and medical team.  4. Debility secondary to Alzheimer's dementia, stable, continue to use can, fall precautions reviewed. No new changes 5. Shortness of breath secondary to CHF, stable, continue to monitor respiratory status, weights, edema, medications.  Visit consisted of counseling and education dealing with the complex and emotionally intense issues of symptom  management and palliative care in the setting of serious and potentially life-threatening illness  Follow up Palliative Care Visit: Palliative care will continue to follow for complex medical decision making, advance care planning, and clarification of goals. Return 4 to 8 weeks or prn.   PPS: 40%   Chief Complaint: Follow up palliative consult for complex medical decision making, address goals, manage ongoing symptoms   HISTORY OF PRESENT ILLNESS:  Mariah [name] is a 77 y.o. year old female  with multiple medial problems including Alzheimer disease, CHF, HTN, Atrial tachycardia, PAD, h/o DVT, abdominal aortic aneurysm, COPD, GERD, psoriatic arthritic, arthritis, CKD, RLS, protein calorie malnutrition. I visited Ms Mariah [name] facility. Purpose of today PC f/u visit further discussion monitor trends of appetite, weights, monitor for functional, cognitive decline with chronic disease progression, assess any active symptoms, supportive role. Ms [name] was sitting outside when Felipe Drone NP [employer] [facility] student and I arrived smoking and chatting with the staff.   Felipe Drone NP [facility] student and I sat down inside in the sunroom and she was pleasant and conversational.  [name] recalled her years at [employer] an her favorite restaurants to visit while she was there.  Ms [name] is content with her current living circumstances thought she does miss living with her sister and brother in law. ROS, discussed functional abilities. Fall risk. Discussed sleeping well most nights. Discussed appetite, could "lick the plates clean" with her meals.  [name] has no new complaints or concerns. She  said her pain is being managed with injections she receives for her psoriatic arthritis. Medications, medical goals, poc reviewed. No new recommendations today. Therapeutic listening, emotional support provided. Questions answered.  Purpose of today PC f/u visit further discussion monitor trends of appetite, weights, monitor for  functional, cognitive decline with chronic disease progression, assess any active symptoms, supportive role.  I spent 61 minutes providing this consultation.. More than 50% of the time in this consultation was spent in counseling and care coordination.  History obtained from review of EMR, discussion with primary team, and interview with family, facility staff/caregiver and/or Mariah [name].  I reviewed available labs, medications, imaging, studies and related documents from the EMR.  Records reviewed and summarized above.    Physical Exam: Constitutional: NAD General: frail appearing, thin, pleasant female conversational and cooperative with exam EYES:  lids intact ENMT: oral mucous membranes moist CV: RRR Pulmonary:  no increased work of breathing, no cough, room air MSK: ambulatory with cane Skin: warm and dry Neuro:  +generalized weakness Psych: non-anxious affect, A and O x 2  Thank you for the opportunity to participate in the care of Ms. Baack. Please call our office at [phone] if we can be of additional assistance.   Vineeth Fell Ihor Gully, NP

## 2022-08-25 DIAGNOSIS — I10 Essential (primary) hypertension: Secondary | ICD-10-CM | POA: Diagnosis not present

## 2022-08-25 DIAGNOSIS — N1832 Chronic kidney disease, stage 3b: Secondary | ICD-10-CM | POA: Diagnosis not present

## 2022-08-26 LAB — BASIC METABOLIC PANEL
BUN/Creatinine Ratio: 14 (ref 12–28)
BUN: 24 mg/dL (ref 8–27)
CO2: 24 mmol/L (ref 20–29)
Calcium: 9.4 mg/dL (ref 8.7–10.3)
Chloride: 98 mmol/L (ref 96–106)
Creatinine, Ser: 1.74 mg/dL — ABNORMAL HIGH (ref 0.57–1.00)
Glucose: 100 mg/dL — ABNORMAL HIGH (ref 70–99)
Potassium: 4.2 mmol/L (ref 3.5–5.2)
Sodium: 141 mmol/L (ref 134–144)
eGFR: 30 mL/min/{1.73_m2} — ABNORMAL LOW (ref >59)

## 2022-09-02 ENCOUNTER — Telehealth: Payer: Self-pay | Admitting: Family Medicine

## 2022-09-02 NOTE — Telephone Encounter (Signed)
Son has requested start of care for PT and OT to be next Tuesday /   [place] Orders - Caller/Agency: [facility]  Callback Number: [phone] vm can be left  Requesting PT and OT  Frequency: delayed until next Tuesday

## 2022-09-03 NOTE — Telephone Encounter (Signed)
OK for verbals 

## 2022-09-03 NOTE — Telephone Encounter (Signed)
Advised 

## 2022-09-08 NOTE — Telephone Encounter (Signed)
Tiffany with [facility] called to advise pcp pt/ot services will be started 09/09/22.

## 2022-09-09 ENCOUNTER — Telehealth: Payer: Self-pay | Admitting: Family Medicine

## 2022-09-09 DIAGNOSIS — E46 Unspecified protein-calorie malnutrition: Secondary | ICD-10-CM | POA: Diagnosis not present

## 2022-09-09 DIAGNOSIS — I270 Primary pulmonary hypertension: Secondary | ICD-10-CM | POA: Diagnosis not present

## 2022-09-09 NOTE — Telephone Encounter (Signed)
Home Health Verbal Orders - Caller/Agency: Gerald Stabs with [facility] Number: [phone]  Requesting PT Frequency: 1 x wk for 9 weeks  Please assist further

## 2022-09-10 NOTE — Telephone Encounter (Signed)
Left voicemail advising of approved PT order.

## 2022-09-10 NOTE — Telephone Encounter (Signed)
OK for verbals 

## 2022-09-12 DIAGNOSIS — E46 Unspecified protein-calorie malnutrition: Secondary | ICD-10-CM | POA: Diagnosis not present

## 2022-09-12 DIAGNOSIS — I270 Primary pulmonary hypertension: Secondary | ICD-10-CM | POA: Diagnosis not present

## 2022-09-16 ENCOUNTER — Telehealth: Payer: Self-pay

## 2022-09-16 DIAGNOSIS — K219 Gastro-esophageal reflux disease without esophagitis: Secondary | ICD-10-CM | POA: Diagnosis not present

## 2022-09-16 DIAGNOSIS — E785 Hyperlipidemia, unspecified: Secondary | ICD-10-CM | POA: Diagnosis not present

## 2022-09-16 DIAGNOSIS — I10 Essential (primary) hypertension: Secondary | ICD-10-CM | POA: Diagnosis not present

## 2022-09-16 DIAGNOSIS — R8290 Unspecified abnormal findings in urine: Secondary | ICD-10-CM | POA: Diagnosis not present

## 2022-09-16 DIAGNOSIS — F0154 Vascular dementia, unspecified severity, with anxiety: Secondary | ICD-10-CM | POA: Diagnosis not present

## 2022-09-16 DIAGNOSIS — N1832 Chronic kidney disease, stage 3b: Secondary | ICD-10-CM | POA: Diagnosis not present

## 2022-09-16 NOTE — Telephone Encounter (Signed)
Copied from [place] [phone]. Topic: General - Other >> Sep 16, 2022 11:27 AM Sabas Sous wrote: Reason for CRM: Tiffany from [facility] called to report that the patient needs another OV for a diagnosis. They currently do not have a billable diagnosis for the patient. Tiffany [phone] option 2

## 2022-09-17 ENCOUNTER — Other Ambulatory Visit: Payer: Self-pay | Admitting: Nephrology

## 2022-09-17 DIAGNOSIS — R8290 Unspecified abnormal findings in urine: Secondary | ICD-10-CM

## 2022-09-17 DIAGNOSIS — E46 Unspecified protein-calorie malnutrition: Secondary | ICD-10-CM | POA: Diagnosis not present

## 2022-09-17 DIAGNOSIS — N1832 Chronic kidney disease, stage 3b: Secondary | ICD-10-CM

## 2022-09-17 DIAGNOSIS — I270 Primary pulmonary hypertension: Secondary | ICD-10-CM | POA: Diagnosis not present

## 2022-09-17 DIAGNOSIS — E785 Hyperlipidemia, unspecified: Secondary | ICD-10-CM

## 2022-09-17 NOTE — Telephone Encounter (Signed)
Sounds like they want her to have an OV.  Can we schedule her for an OV.  Virtual or in person

## 2022-09-18 NOTE — Telephone Encounter (Signed)
Called and left vm on VM to call back to schedule.  Okay for PEC to schedule virtual or in office visit.

## 2022-09-22 ENCOUNTER — Ambulatory Visit
Admission: RE | Admit: 2022-09-22 | Discharge: 2022-09-22 | Disposition: A | Discharge status: Home / Self Care | Payer: 59 | Source: Ambulatory Visit | Attending: Nephrology | Admitting: Nephrology

## 2022-09-22 DIAGNOSIS — N1832 Chronic kidney disease, stage 3b: Secondary | ICD-10-CM | POA: Insufficient documentation

## 2022-09-22 DIAGNOSIS — E785 Hyperlipidemia, unspecified: Secondary | ICD-10-CM | POA: Insufficient documentation

## 2022-09-22 DIAGNOSIS — I270 Primary pulmonary hypertension: Secondary | ICD-10-CM | POA: Diagnosis not present

## 2022-09-22 DIAGNOSIS — R8290 Unspecified abnormal findings in urine: Secondary | ICD-10-CM | POA: Diagnosis not present

## 2022-09-22 DIAGNOSIS — E46 Unspecified protein-calorie malnutrition: Secondary | ICD-10-CM | POA: Diagnosis not present

## 2022-09-22 DIAGNOSIS — N189 Chronic kidney disease, unspecified: Secondary | ICD-10-CM | POA: Diagnosis not present

## 2022-09-23 ENCOUNTER — Telehealth: Payer: Self-pay

## 2022-09-23 NOTE — Telephone Encounter (Signed)
Copied from [place] [phone]. Topic: General - Other >> Sep 23, 2022 11:34 AM Sabas Sous wrote: Reason for CRM: [facility] called requesting office visit notes, (signed by PCP) for the home health orders for the patient. Needs this for compliance to show why patient needs the services  Best contact: [phone] (for both phone and fax) Amy from [facility]

## 2022-09-24 NOTE — Telephone Encounter (Signed)
MED RECORD REQUEST: Ok to send last OV notes as requested

## 2022-09-29 ENCOUNTER — Other Ambulatory Visit: Payer: Self-pay | Admitting: Family Medicine

## 2022-09-29 DIAGNOSIS — E46 Unspecified protein-calorie malnutrition: Secondary | ICD-10-CM | POA: Diagnosis not present

## 2022-09-29 DIAGNOSIS — J439 Emphysema, unspecified: Secondary | ICD-10-CM

## 2022-09-29 DIAGNOSIS — I270 Primary pulmonary hypertension: Secondary | ICD-10-CM | POA: Diagnosis not present

## 2022-10-06 DIAGNOSIS — K219 Gastro-esophageal reflux disease without esophagitis: Secondary | ICD-10-CM | POA: Diagnosis not present

## 2022-10-06 DIAGNOSIS — E785 Hyperlipidemia, unspecified: Secondary | ICD-10-CM | POA: Diagnosis not present

## 2022-10-06 DIAGNOSIS — I10 Essential (primary) hypertension: Secondary | ICD-10-CM | POA: Diagnosis not present

## 2022-10-06 DIAGNOSIS — R8290 Unspecified abnormal findings in urine: Secondary | ICD-10-CM | POA: Diagnosis not present

## 2022-10-06 DIAGNOSIS — N1832 Chronic kidney disease, stage 3b: Secondary | ICD-10-CM | POA: Diagnosis not present

## 2022-10-07 ENCOUNTER — Telehealth: Payer: Self-pay | Admitting: Family Medicine

## 2022-10-07 DIAGNOSIS — E785 Hyperlipidemia, unspecified: Secondary | ICD-10-CM | POA: Diagnosis not present

## 2022-10-07 DIAGNOSIS — I5033 Acute on chronic diastolic (congestive) heart failure: Secondary | ICD-10-CM | POA: Diagnosis not present

## 2022-10-07 DIAGNOSIS — Z7982 Long term (current) use of aspirin: Secondary | ICD-10-CM | POA: Diagnosis not present

## 2022-10-07 DIAGNOSIS — M5416 Radiculopathy, lumbar region: Secondary | ICD-10-CM | POA: Diagnosis not present

## 2022-10-07 DIAGNOSIS — M810 Age-related osteoporosis without current pathological fracture: Secondary | ICD-10-CM | POA: Diagnosis not present

## 2022-10-07 DIAGNOSIS — Z7951 Long term (current) use of inhaled steroids: Secondary | ICD-10-CM | POA: Diagnosis not present

## 2022-10-07 DIAGNOSIS — G2581 Restless legs syndrome: Secondary | ICD-10-CM | POA: Diagnosis not present

## 2022-10-07 DIAGNOSIS — E43 Unspecified severe protein-calorie malnutrition: Secondary | ICD-10-CM | POA: Diagnosis not present

## 2022-10-07 DIAGNOSIS — F17210 Nicotine dependence, cigarettes, uncomplicated: Secondary | ICD-10-CM | POA: Diagnosis not present

## 2022-10-07 DIAGNOSIS — D696 Thrombocytopenia, unspecified: Secondary | ICD-10-CM | POA: Diagnosis not present

## 2022-10-07 DIAGNOSIS — N1832 Chronic kidney disease, stage 3b: Secondary | ICD-10-CM | POA: Diagnosis not present

## 2022-10-07 DIAGNOSIS — I130 Hypertensive heart and chronic kidney disease with heart failure and stage 1 through stage 4 chronic kidney disease, or unspecified chronic kidney disease: Secondary | ICD-10-CM | POA: Diagnosis not present

## 2022-10-07 DIAGNOSIS — I714 Abdominal aortic aneurysm, without rupture, unspecified: Secondary | ICD-10-CM | POA: Diagnosis not present

## 2022-10-07 DIAGNOSIS — K219 Gastro-esophageal reflux disease without esophagitis: Secondary | ICD-10-CM | POA: Diagnosis not present

## 2022-10-07 DIAGNOSIS — L4050 Arthropathic psoriasis, unspecified: Secondary | ICD-10-CM | POA: Diagnosis not present

## 2022-10-07 DIAGNOSIS — I739 Peripheral vascular disease, unspecified: Secondary | ICD-10-CM | POA: Diagnosis not present

## 2022-10-07 DIAGNOSIS — G43909 Migraine, unspecified, not intractable, without status migrainosus: Secondary | ICD-10-CM | POA: Diagnosis not present

## 2022-10-07 DIAGNOSIS — G309 Alzheimer's disease, unspecified: Secondary | ICD-10-CM | POA: Diagnosis not present

## 2022-10-07 DIAGNOSIS — J449 Chronic obstructive pulmonary disease, unspecified: Secondary | ICD-10-CM | POA: Diagnosis not present

## 2022-10-07 DIAGNOSIS — Z86718 Personal history of other venous thrombosis and embolism: Secondary | ICD-10-CM | POA: Diagnosis not present

## 2022-10-07 DIAGNOSIS — E559 Vitamin D deficiency, unspecified: Secondary | ICD-10-CM | POA: Diagnosis not present

## 2022-10-07 NOTE — Telephone Encounter (Signed)
Chris advised.  

## 2022-10-07 NOTE — Telephone Encounter (Signed)
Home Health Verbal Orders - Caller/Agency: Chris from [facility] Callback Number: [phone] Requesting PT Frequency: 1x9 

## 2022-10-14 DIAGNOSIS — L409 Psoriasis, unspecified: Secondary | ICD-10-CM | POA: Diagnosis not present

## 2022-10-14 DIAGNOSIS — E785 Hyperlipidemia, unspecified: Secondary | ICD-10-CM | POA: Diagnosis not present

## 2022-10-14 DIAGNOSIS — N1832 Chronic kidney disease, stage 3b: Secondary | ICD-10-CM | POA: Diagnosis not present

## 2022-10-14 DIAGNOSIS — I10 Essential (primary) hypertension: Secondary | ICD-10-CM | POA: Diagnosis not present

## 2022-10-14 DIAGNOSIS — R8290 Unspecified abnormal findings in urine: Secondary | ICD-10-CM | POA: Diagnosis not present

## 2022-10-14 DIAGNOSIS — K219 Gastro-esophageal reflux disease without esophagitis: Secondary | ICD-10-CM | POA: Diagnosis not present

## 2022-10-14 DIAGNOSIS — F0154 Vascular dementia, unspecified severity, with anxiety: Secondary | ICD-10-CM | POA: Diagnosis not present

## 2022-10-27 ENCOUNTER — Non-Acute Institutional Stay: Payer: 59 | Admitting: Nurse Practitioner

## 2022-10-27 DIAGNOSIS — Z515 Encounter for palliative care: Secondary | ICD-10-CM | POA: Diagnosis not present

## 2022-10-27 DIAGNOSIS — R0602 Shortness of breath: Secondary | ICD-10-CM

## 2022-10-27 DIAGNOSIS — I509 Heart failure, unspecified: Secondary | ICD-10-CM

## 2022-10-27 DIAGNOSIS — R5381 Other malaise: Secondary | ICD-10-CM | POA: Diagnosis not present

## 2022-10-27 DIAGNOSIS — G309 Alzheimer's disease, unspecified: Secondary | ICD-10-CM

## 2022-10-27 NOTE — Progress Notes (Signed)
Therapist, nutritional Palliative Care Consult Note Telephone: [phone]  Fax: [phone]    Date of encounter: 10/27/22 2:09 PM PATIENT NAME: Mariah [name] [address] [place] [address]   [phone] (home)  DOB: [birth year] MRN: [id number] PRIMARY CARE PROVIDER:   [name] Mariah Downer, MD,  [address] [place] [address] [place] [address] [phone]  RESPONSIBLE PARTY:    Contact Information     Name Relation Home Work Mobile   Mariah [name] Relative [phone]     Mariah [name], Mariah [name] Niece [phone]       I met face to face with patient in [facility]. Palliative Care was asked to follow this patient by consultation request of  Bacigalupo, Mariah Schlein, MD to address advance care planning and complex medical decision making. This is a follow up visit.                                  ASSESSMENT AND PLAN / RECOMMENDATIONS:  1. Advance Care Planning;  Full code 2. Dementia slow progression with worsening short term memory, discussed with caregivers at facility, continue reminders, supportive role.  3. Palliative care encounter; Palliative care encounter; Palliative medicine team will continue to support patient, patient's family, and medical team.  4. Debility secondary to Alzheimer's dementia, stable, continue to use can, fall precautions reviewed. No new changes 5. Shortness of breath secondary to CHF, stable, continue to monitor respiratory status, weights, edema, medications.  Visit consisted of counseling and education dealing with the complex and emotionally intense issues of symptom management and palliative care in the setting of serious and potentially life-threatening illness  Follow up Palliative Care Visit: Palliative care will continue to follow for complex medical decision making, advance care planning, and clarification of goals. Return 4 to 8 weeks or prn.   PPS: 40%   Chief Complaint: Follow up palliative  consult for complex medical decision making, address goals, manage ongoing symptoms   HISTORY OF PRESENT ILLNESS:  Mariah [name] is a 77 y.o. year old female  with multiple medial problems including Alzheimer disease, CHF, HTN, Atrial tachycardia, PAD, h/o DVT, abdominal aortic aneurysm, COPD, GERD, psoriatic arthritic, arthritis, CKD, RLS, protein calorie malnutrition. I visited Ms Mariah [name] facility, we talked about purpose of pc visit, how she has been feeling, her daily routine, smoking cessation which she declines. We talked about ros, appetite, foods she likes, no noted changes in clothes size. We talked about functional abilities. No recent falls, wounds, hospitalizations, infections. We talked about her soap operas she enjoys. We talked about no new changes, pain is stable currently. Caregiver endorses worsening with being more forgetful, memory short term worsening. We talked about progression of dementia, slow decline, will continue to monitor/follow; Medications, medical goals, poc reviewed. No new recommendations today. Therapeutic listening, emotional support provided. Questions answered.  PC f/u visit further discussion monitor trends of appetite, weights, monitor for functional, cognitive decline with chronic disease progression, assess any active symptoms, supportive role. Attempted to contact niece Mariah [name]   I spent 43 minutes providing this consultation.. More than 50% of the time in this consultation was spent in counseling and care coordination.  History obtained from review of EMR, discussion with primary team, and interview with family, facility staff/caregiver and/or Mariah [name].  I reviewed available labs, medications, imaging, studies and related documents from the EMR.  Records reviewed and summarized above.  Physical Exam: General: pleasant female ENMT: oral mucous membranes moist CV: RRR Pulmonary:  no increased work of breathing, no cough, room air Skin: warm and  dry Neuro:  +generalized weakness Psych: non-anxious affect, Alert, oriented, forgetful Thank you for the opportunity to participate in the care of Mariah [name]. Please call our office at [phone] if we can be of additional assistance.   Mariah Duling [place], NP

## 2022-11-13 ENCOUNTER — Other Ambulatory Visit: Payer: Self-pay | Admitting: Family Medicine

## 2022-11-13 NOTE — Telephone Encounter (Signed)
Requested Prescriptions  Pending Prescriptions Disp Refills   gabapentin (NEURONTIN) 300 MG capsule [Pharmacy Med Name: GABAPENTIN 300 MG CAPSULE] 180 capsule 1    Sig: TAKE (1) CAPSULE BY MOUTH TWICE A DAY. (MAY OPEN & SPRINKLE ON FOOD.)     Neurology: Anticonvulsants - gabapentin Failed - 11/13/2022  2:36 PM      Failed - Cr in normal range and within 360 days    Creat  Date Value Ref Range Status  07/15/2017 0.85 0.60 - 0.93 mg/dL Final    Comment:    For patients >43 years of age, the reference limit for Creatinine is approximately 13% higher for people identified as African-American. .    Creatinine, Ser  Date Value Ref Range Status  08/25/2022 1.74 (H) 0.57 - 1.00 mg/dL Final         Passed - Completed PHQ-2 or PHQ-9 in the last 360 days      Passed - Valid encounter within last 12 months    Recent Outpatient Visits           4 months ago Encounter for annual wellness visit (AWV) in Medicare patient   [place] [facility] [place], Marzella Schlein, MD   10 months ago Essential (primary) hypertension   [place] [facility] [place], Marzella Schlein, MD   1 year ago Encounter for examination for admission to assisted living facility   [facility] [place], Darl Householder, DO   1 year ago Transaminitis   [place] [facility] [place], Marzella Schlein, MD   1 year ago Encounter for annual wellness visit (AWV) in Medicare patient   [facility] [place], Marzella Schlein, MD

## 2022-11-26 ENCOUNTER — Telehealth: Payer: Self-pay

## 2022-11-26 NOTE — Telephone Encounter (Signed)
Signed the copy that I received today. Do not know where the previous ones are as I've completed anything that I received. Ready to fax today's copy

## 2022-11-26 NOTE — Telephone Encounter (Signed)
Copied from CRM [id number]. Topic: General - Other >> Nov 26, 2022  8:14 AM Everette C wrote: Reason for CRM: Inetta Fermo with [employer] has called regarding a previously submitted request for incontinence supplies that needs to be updated   Inetta Fermo shares that documents were submitted previously as well as yesterday 11/25/22, Inetta Fermo shares that the supply order form has been submittted to the practice roughly 3/4 times   Inetta Fermo is requesting PCP signature on the DWO and CMN for incontinence supplies and faxed back to [phone]  Please contact further when possible

## 2022-12-01 DIAGNOSIS — G3184 Mild cognitive impairment, so stated: Secondary | ICD-10-CM | POA: Diagnosis not present

## 2022-12-01 DIAGNOSIS — Z79899 Other long term (current) drug therapy: Secondary | ICD-10-CM | POA: Diagnosis not present

## 2023-01-20 ENCOUNTER — Other Ambulatory Visit: Payer: Self-pay | Admitting: Family Medicine

## 2023-01-20 DIAGNOSIS — J439 Emphysema, unspecified: Secondary | ICD-10-CM

## 2023-02-01 ENCOUNTER — Other Ambulatory Visit: Payer: Self-pay | Admitting: Family Medicine

## 2023-02-02 NOTE — Telephone Encounter (Signed)
Requested medication (s) are due for refill today: Yes  Requested medication (s) are on the active medication list: Yes  Last refill:  08/16/22  Future visit scheduled: No  Notes to clinic:  Unable to refill per protocol due to diagnosis code needed.      Requested Prescriptions  Pending Prescriptions Disp Refills   clobetasol (TEMOVATE) 0.05 % external solution [Pharmacy Med Name: CLOBETASOL 0.05% SOLUTION] 50 mL 0    Sig: APPLY TOPICALLY TO SCALP AREA TWICE A DAY.     Not Delegated - Dermatology:  Corticosteroids Failed - 02/01/2023  4:04 PM      Failed - This refill cannot be delegated      Passed - Valid encounter within last 12 months    Recent Outpatient Visits           7 months ago Encounter for annual wellness visit (AWV) in Medicare patient   [place] [facility] [place], Marzella Schlein, MD   1 year ago Essential (primary) hypertension   [place] [facility] [place], Marzella Schlein, MD   1 year ago Encounter for examination for admission to assisted living facility   [facility] [place], Darl Householder, DO   1 year ago Transaminitis    [facility] [place], Marzella Schlein, MD   2 years ago Encounter for annual wellness visit (AWV) in Medicare patient   [facility] [place], Marzella Schlein, MD

## 2023-03-01 ENCOUNTER — Ambulatory Visit (INDEPENDENT_AMBULATORY_CARE_PROVIDER_SITE_OTHER): Payer: 59 | Admitting: Family Medicine

## 2023-03-01 ENCOUNTER — Encounter: Payer: Self-pay | Admitting: Family Medicine

## 2023-03-01 ENCOUNTER — Telehealth: Payer: Self-pay | Admitting: Family Medicine

## 2023-03-01 VITALS — BP 138/79 | HR 76 | Temp 97.8°F | Resp 20 | Ht 61.0 in | Wt 132.7 lb

## 2023-03-01 DIAGNOSIS — M25551 Pain in right hip: Secondary | ICD-10-CM

## 2023-03-01 DIAGNOSIS — R410 Disorientation, unspecified: Secondary | ICD-10-CM | POA: Diagnosis not present

## 2023-03-01 DIAGNOSIS — R300 Dysuria: Secondary | ICD-10-CM

## 2023-03-01 LAB — POCT URINALYSIS DIPSTICK
Bilirubin, UA: NEGATIVE
Blood, UA: NEGATIVE
Glucose, UA: NEGATIVE
Ketones, UA: NEGATIVE
Nitrite, UA: POSITIVE
Protein, UA: NEGATIVE
Spec Grav, UA: <=1.005 — AB (ref 1.010–1.025)
Urobilinogen, UA: 0.2 E.U./dL
pH, UA: 7 (ref 5.0–8.0)

## 2023-03-01 MED ORDER — CEPHALEXIN 500 MG PO CAPS: 500.0000 mg | ORAL_CAPSULE | Freq: Three times a day (TID) | ORAL | 0 refills | Status: AC

## 2023-03-01 MED ORDER — TRAZODONE HCL 50 MG PO TABS: 25.0000 mg | ORAL_TABLET | Freq: Every evening | ORAL | 3 refills | Status: DC | PRN

## 2023-03-01 NOTE — Assessment & Plan Note (Addendum)
No typical symptoms associated with UTI  UA significant for leukocytes and nitrates  Urine culture pending  Take Keflex TID for 5 days  UA ordered prn if symptoms do not resolve

## 2023-03-01 NOTE — Assessment & Plan Note (Signed)
Chronic issue  Referral to Orthopedics

## 2023-03-01 NOTE — Progress Notes (Signed)
Established Patient Office Visit  Subjective   Patient ID: Mariah [name], female    DOB: September 21, 1945  Age: 77 y.o. MRN: [id number]  No chief complaint on file.   Mariah [name] is here today with her caregiver Mariah [name].  Mariah [name] expressed her concerns about Mariah [name]. She has had increased agitation over the past couple of months. She sleeps more than normal and has laid in bed all day. She gets agitated if she cannot smoke and if she is allowed to smoke, she is using 2 packs a day. Wanted her checked for a UTI but these symptoms seem more longstanding. Mariah [name] has no complaints today. She is unsure why she is here. She complained of R hip pain due to her arthritis and is requesting a referral to orthopedics. She says she is agitated because she wants to go back to the way she used to be living.   Review of Systems  Genitourinary:  Negative for dysuria, frequency and urgency.  Psychiatric/Behavioral:  Positive for memory loss. The patient has insomnia.       Objective:     BP 138/79 (BP Location: Right Arm, Patient Position: Sitting, Cuff Size: Normal)   Pulse 76   Temp 97.8 F (36.6 C) (Temporal)   Resp 20   Ht 5\' 1"  (1.549 m)   Wt 132 lb 11.2 oz (60.2 kg)   SpO2 95%   BMI 25.07 kg/m  BP Readings from Last 3 Encounters:  03/01/23 138/79  07/02/22 115/79  01/23/22 110/74   Wt Readings from Last 3 Encounters:  03/01/23 132 lb 11.2 oz (60.2 kg)  07/02/22 137 lb (62.1 kg)  01/23/22 135 lb 2 oz (61.3 kg)      Physical Exam Constitutional:      Appearance: Normal appearance.  Neurological:     General: No focal deficit present.     Mental Status: She is alert and oriented to person, place, and time. Mental status is at baseline.    No results found for any visits on 03/01/23.   The 10-year ASCVD risk score (Arnett DK, et al., 2019) is: 41.5%    Assessment & Plan:   Problem List Items Addressed This Visit       Nervous and Auditory   Delirium    New worsening issues with  agitation over the past couple of months  May be related to UTI, but also current living situation, lack of sleep, and sundowning may be contributing Trazodone prn for insomnia  Instructions to staff about redirecting, keeping the blinds open during the day and closed at night         Other   Right hip pain    Chronic issue  Referral to Orthopedics       Relevant Orders   Ambulatory referral to Orthopedic Surgery   Dysuria - Primary    No typical symptoms associated with UTI  UA significant for leukocytes and nitrates  Urine culture pending  Take Keflex TID for 5 days  UA ordered prn if symptoms do not resolve      Relevant Orders   POCT urinalysis dipstick (Completed)   Urine Culture    Return in about 4 months (around 07/01/2023) for CPE, AWV.    [name], Medical Student   Patient seen along with MS3 student Jodi Marble. I personally evaluated this patient along with the student, and verified all aspects of the history, physical exam, and medical decision making as documented by the student. I agree with the  student's documentation and have made all necessary edits.  , Marzella Schlein, MD, MPH [facility]

## 2023-03-01 NOTE — Telephone Encounter (Signed)
The pharmacist from [facility] calling in regards to medication traZODone (DESYREL) 50 MG tablet. Stated pt is in an assisted living and can't have ranges on medication.  Sig - Route: Take 0.5-1 tablets (25-50 mg total) by mouth at bedtime as needed for sleep. - Oral   Please advise.  Pharmacy is requesting a callback.

## 2023-03-01 NOTE — Assessment & Plan Note (Signed)
New worsening issues with agitation over the past couple of months  May be related to UTI, but also current living situation, lack of sleep, and sundowning may be contributing Trazodone prn for insomnia  Instructions to staff about redirecting, keeping the blinds open during the day and closed at night

## 2023-03-02 ENCOUNTER — Telehealth: Payer: Self-pay

## 2023-03-02 NOTE — Telephone Encounter (Signed)
Please correct to 50mg  at bedtime prn

## 2023-03-02 NOTE — Telephone Encounter (Signed)
Please review

## 2023-03-02 NOTE — Telephone Encounter (Signed)
[facility] pharmacy advised of new sig for Trazodone 50 mg at bedtime prn.

## 2023-03-02 NOTE — Telephone Encounter (Signed)
Duplicate

## 2023-03-02 NOTE — Telephone Encounter (Signed)
Copied from CRM [id number]. Topic: General - Other >> Mar 02, 2023  9:23 AM Franchot Heidelberg wrote: Reason for CRM: Assisted living called reporting that they need a call back to discuss the patient's medication traZODone (DESYREL) 50 MG tablet. They need different directions per their med regulations.   Best contact: Revonda Standard [phone]

## 2023-03-09 ENCOUNTER — Ambulatory Visit: Payer: Self-pay | Admitting: *Deleted

## 2023-03-09 DIAGNOSIS — G8929 Other chronic pain: Secondary | ICD-10-CM | POA: Diagnosis not present

## 2023-03-09 DIAGNOSIS — M7061 Trochanteric bursitis, right hip: Secondary | ICD-10-CM | POA: Diagnosis not present

## 2023-03-09 NOTE — Telephone Encounter (Signed)
Chief Complaint: Right hip pain Symptoms: Severe pain Frequency: 2 weeks onset Pertinent Negatives: Patient denies other symptoms Disposition: [] ED /[] Urgent Care (no appt availability in office) / [] Appointment(In office/virtual)/ []  [facility]/ [] Home Care/ [] Refused Recommended Disposition /[] [facility]/ [x]  Follow-up with PCP Additional Notes: Patient's niece Jahniya Redstone called and says the patient was seen by the orthopedic today and he told her that he couldn't prescribe anything for pain because he's not the PCP. He advised the niece to call PCP to get something for inflammation and pain. She says this would need to be called in to the assisted living pharmacy. Advised since the office is closed, it will be tomorrow before Dr. Beryle Flock reviews this note. She says that's fine as long as something is called in tomorrow.   Summary: pain med   Niece Lailaa today to ask for pain medicine and something for inflammation for the pt, per DR Artis Flock the ortho dr. Dr Artis Flock said he cannot prescribe any pain meds b/c she lives at [facility] assisted living.  Saadia states pt has seen dr B for this hip issue, and declined appt.   Pt does not see the dr at her assisted living, so Dr B will need to prescribe any meds.   [facility] has their own pharmacy and will need to call them     Reason for Disposition  Hip pain is a chronic symptom (recurrent or ongoing AND present > 4 weeks)  Answer Assessment - Initial Assessment Questions 1. LOCATION and RADIATION: "Where is the pain located?"      Right hip 2. QUALITY: "What does the pain feel like?"  (e.g., sharp, dull, aching, burning)     Aching 3. SEVERITY: "How bad is the pain?" "What does it keep you from doing?"   (Scale 1-10; or mild, moderate, severe)   -  MILD (1-3): doesn't interfere with normal activities    -  MODERATE (4-7): interferes with normal activities (e.g., work or school) or awakens from sleep,  limping    -  SEVERE (8-10): excruciating pain, unable to do any normal activities, unable to walk     Severe 4. ONSET: "When did the pain start?" "Does it come and go, or is it there all the time?"     2 weeks ago maybe 5. WORK OR EXERCISE: "Has there been any recent work or exercise that involved this part of the body?"      N/A 6. CAUSE: "What do you think is causing the hip pain?"      Not sure 7. AGGRAVATING FACTORS: "What makes the hip pain worse?" (e.g., walking, climbing stairs, running)     Walking 8. OTHER SYMPTOMS: "Do you have any other symptoms?" (e.g., back pain, pain shooting down leg,  fever, rash)     No  Protocols used: Hip Pain-A-AH

## 2023-03-10 NOTE — Telephone Encounter (Signed)
For PCP review upon return to office

## 2023-03-11 ENCOUNTER — Other Ambulatory Visit: Payer: Self-pay

## 2023-03-11 DIAGNOSIS — M1990 Unspecified osteoarthritis, unspecified site: Secondary | ICD-10-CM

## 2023-03-11 MED ORDER — TRAMADOL HCL 50 MG PO TABS: 50.0000 mg | ORAL_TABLET | Freq: Four times a day (QID) | ORAL | 0 refills | Status: AC | PRN

## 2023-03-11 NOTE — Telephone Encounter (Signed)
Mariah [name],Mariah [name] advised as below. She reports that patient has not had Tramadol for several months. She is requesting a new prescription sent to [facility]. She reports that patient will be seeing the provider at [facility] starting next week. Dr. B advised.

## 2023-03-11 NOTE — Telephone Encounter (Signed)
Would do tylenol 1000mg  twice daily for baseline control of pain. She has Tramadol on her med list as well. Can use that prn for pain also. Do not recommend NSAIDs with last kidney function

## 2023-03-16 DIAGNOSIS — L853 Xerosis cutis: Secondary | ICD-10-CM | POA: Diagnosis not present

## 2023-03-16 DIAGNOSIS — K219 Gastro-esophageal reflux disease without esophagitis: Secondary | ICD-10-CM | POA: Diagnosis not present

## 2023-03-16 DIAGNOSIS — J449 Chronic obstructive pulmonary disease, unspecified: Secondary | ICD-10-CM | POA: Diagnosis not present

## 2023-03-16 DIAGNOSIS — I499 Cardiac arrhythmia, unspecified: Secondary | ICD-10-CM | POA: Diagnosis not present

## 2023-03-16 DIAGNOSIS — N189 Chronic kidney disease, unspecified: Secondary | ICD-10-CM | POA: Diagnosis not present

## 2023-03-16 DIAGNOSIS — I509 Heart failure, unspecified: Secondary | ICD-10-CM | POA: Diagnosis not present

## 2023-03-16 DIAGNOSIS — R5381 Other malaise: Secondary | ICD-10-CM | POA: Diagnosis not present

## 2023-03-16 DIAGNOSIS — I739 Peripheral vascular disease, unspecified: Secondary | ICD-10-CM | POA: Diagnosis not present

## 2023-03-16 DIAGNOSIS — I10 Essential (primary) hypertension: Secondary | ICD-10-CM | POA: Diagnosis not present

## 2023-03-17 ENCOUNTER — Other Ambulatory Visit: Payer: Self-pay

## 2023-03-17 ENCOUNTER — Inpatient Hospital Stay
Admission: EM | Admit: 2023-03-17 | Discharge: 2023-03-23 | DRG: 189 | Disposition: A | Discharge status: Hospice | Payer: 59 | Attending: Internal Medicine | Admitting: Internal Medicine

## 2023-03-17 ENCOUNTER — Emergency Department: Payer: 59

## 2023-03-17 DIAGNOSIS — R778 Other specified abnormalities of plasma proteins: Secondary | ICD-10-CM | POA: Diagnosis not present

## 2023-03-17 DIAGNOSIS — K219 Gastro-esophageal reflux disease without esophagitis: Secondary | ICD-10-CM | POA: Diagnosis present

## 2023-03-17 DIAGNOSIS — J9811 Atelectasis: Secondary | ICD-10-CM | POA: Diagnosis not present

## 2023-03-17 DIAGNOSIS — N183 Chronic kidney disease, stage 3 unspecified: Secondary | ICD-10-CM | POA: Diagnosis not present

## 2023-03-17 DIAGNOSIS — R918 Other nonspecific abnormal finding of lung field: Secondary | ICD-10-CM

## 2023-03-17 DIAGNOSIS — Z8249 Family history of ischemic heart disease and other diseases of the circulatory system: Secondary | ICD-10-CM

## 2023-03-17 DIAGNOSIS — N179 Acute kidney failure, unspecified: Secondary | ICD-10-CM | POA: Diagnosis present

## 2023-03-17 DIAGNOSIS — Z86718 Personal history of other venous thrombosis and embolism: Secondary | ICD-10-CM

## 2023-03-17 DIAGNOSIS — C787 Secondary malignant neoplasm of liver and intrahepatic bile duct: Secondary | ICD-10-CM | POA: Diagnosis not present

## 2023-03-17 DIAGNOSIS — C3402 Malignant neoplasm of left main bronchus: Secondary | ICD-10-CM | POA: Diagnosis not present

## 2023-03-17 DIAGNOSIS — I130 Hypertensive heart and chronic kidney disease with heart failure and stage 1 through stage 4 chronic kidney disease, or unspecified chronic kidney disease: Secondary | ICD-10-CM | POA: Diagnosis present

## 2023-03-17 DIAGNOSIS — G309 Alzheimer's disease, unspecified: Secondary | ICD-10-CM | POA: Diagnosis present

## 2023-03-17 DIAGNOSIS — R7989 Other specified abnormal findings of blood chemistry: Secondary | ICD-10-CM | POA: Diagnosis not present

## 2023-03-17 DIAGNOSIS — I7121 Aneurysm of the ascending aorta, without rupture: Secondary | ICD-10-CM | POA: Diagnosis not present

## 2023-03-17 DIAGNOSIS — R5382 Chronic fatigue, unspecified: Secondary | ICD-10-CM | POA: Diagnosis not present

## 2023-03-17 DIAGNOSIS — R059 Cough, unspecified: Secondary | ICD-10-CM | POA: Diagnosis not present

## 2023-03-17 DIAGNOSIS — L4050 Arthropathic psoriasis, unspecified: Secondary | ICD-10-CM | POA: Diagnosis present

## 2023-03-17 DIAGNOSIS — I739 Peripheral vascular disease, unspecified: Secondary | ICD-10-CM | POA: Diagnosis present

## 2023-03-17 DIAGNOSIS — E785 Hyperlipidemia, unspecified: Secondary | ICD-10-CM | POA: Diagnosis not present

## 2023-03-17 DIAGNOSIS — R21 Rash and other nonspecific skin eruption: Secondary | ICD-10-CM | POA: Diagnosis present

## 2023-03-17 DIAGNOSIS — R062 Wheezing: Secondary | ICD-10-CM | POA: Diagnosis not present

## 2023-03-17 DIAGNOSIS — F17210 Nicotine dependence, cigarettes, uncomplicated: Secondary | ICD-10-CM | POA: Diagnosis present

## 2023-03-17 DIAGNOSIS — Z7401 Bed confinement status: Secondary | ICD-10-CM | POA: Diagnosis not present

## 2023-03-17 DIAGNOSIS — Z79891 Long term (current) use of opiate analgesic: Secondary | ICD-10-CM

## 2023-03-17 DIAGNOSIS — D696 Thrombocytopenia, unspecified: Secondary | ICD-10-CM | POA: Diagnosis not present

## 2023-03-17 DIAGNOSIS — Z79899 Other long term (current) drug therapy: Secondary | ICD-10-CM

## 2023-03-17 DIAGNOSIS — N3080 Other cystitis without hematuria: Secondary | ICD-10-CM | POA: Diagnosis not present

## 2023-03-17 DIAGNOSIS — C799 Secondary malignant neoplasm of unspecified site: Secondary | ICD-10-CM | POA: Diagnosis present

## 2023-03-17 DIAGNOSIS — I509 Heart failure, unspecified: Secondary | ICD-10-CM

## 2023-03-17 DIAGNOSIS — I499 Cardiac arrhythmia, unspecified: Secondary | ICD-10-CM | POA: Diagnosis not present

## 2023-03-17 DIAGNOSIS — J9601 Acute respiratory failure with hypoxia: Secondary | ICD-10-CM | POA: Diagnosis present

## 2023-03-17 DIAGNOSIS — E440 Moderate protein-calorie malnutrition: Secondary | ICD-10-CM | POA: Diagnosis not present

## 2023-03-17 DIAGNOSIS — R0602 Shortness of breath: Secondary | ICD-10-CM | POA: Diagnosis not present

## 2023-03-17 DIAGNOSIS — F0280 Dementia in other diseases classified elsewhere without behavioral disturbance: Secondary | ICD-10-CM | POA: Diagnosis present

## 2023-03-17 DIAGNOSIS — J90 Pleural effusion, not elsewhere classified: Secondary | ICD-10-CM | POA: Diagnosis present

## 2023-03-17 DIAGNOSIS — I714 Abdominal aortic aneurysm, without rupture, unspecified: Secondary | ICD-10-CM | POA: Diagnosis present

## 2023-03-17 DIAGNOSIS — I5032 Chronic diastolic (congestive) heart failure: Secondary | ICD-10-CM | POA: Diagnosis present

## 2023-03-17 DIAGNOSIS — Z7951 Long term (current) use of inhaled steroids: Secondary | ICD-10-CM

## 2023-03-17 DIAGNOSIS — N1832 Chronic kidney disease, stage 3b: Secondary | ICD-10-CM | POA: Diagnosis not present

## 2023-03-17 DIAGNOSIS — R222 Localized swelling, mass and lump, trunk: Secondary | ICD-10-CM | POA: Diagnosis present

## 2023-03-17 DIAGNOSIS — R531 Weakness: Secondary | ICD-10-CM | POA: Diagnosis not present

## 2023-03-17 DIAGNOSIS — E559 Vitamin D deficiency, unspecified: Secondary | ICD-10-CM | POA: Diagnosis present

## 2023-03-17 DIAGNOSIS — Z825 Family history of asthma and other chronic lower respiratory diseases: Secondary | ICD-10-CM

## 2023-03-17 DIAGNOSIS — Z881 Allergy status to other antibiotic agents status: Secondary | ICD-10-CM

## 2023-03-17 DIAGNOSIS — R6883 Chills (without fever): Secondary | ICD-10-CM | POA: Diagnosis not present

## 2023-03-17 DIAGNOSIS — C771 Secondary and unspecified malignant neoplasm of intrathoracic lymph nodes: Secondary | ICD-10-CM | POA: Diagnosis present

## 2023-03-17 DIAGNOSIS — Z96641 Presence of right artificial hip joint: Secondary | ICD-10-CM | POA: Diagnosis present

## 2023-03-17 DIAGNOSIS — I10 Essential (primary) hypertension: Secondary | ICD-10-CM | POA: Diagnosis present

## 2023-03-17 DIAGNOSIS — N1831 Chronic kidney disease, stage 3a: Secondary | ICD-10-CM | POA: Diagnosis not present

## 2023-03-17 DIAGNOSIS — E876 Hypokalemia: Secondary | ICD-10-CM | POA: Diagnosis present

## 2023-03-17 DIAGNOSIS — J449 Chronic obstructive pulmonary disease, unspecified: Secondary | ICD-10-CM | POA: Diagnosis not present

## 2023-03-17 DIAGNOSIS — Z6824 Body mass index (BMI) 24.0-24.9, adult: Secondary | ICD-10-CM

## 2023-03-17 DIAGNOSIS — J441 Chronic obstructive pulmonary disease with (acute) exacerbation: Secondary | ICD-10-CM | POA: Diagnosis not present

## 2023-03-17 DIAGNOSIS — Z66 Do not resuscitate: Secondary | ICD-10-CM | POA: Diagnosis not present

## 2023-03-17 DIAGNOSIS — Z7982 Long term (current) use of aspirin: Secondary | ICD-10-CM

## 2023-03-17 DIAGNOSIS — Z8261 Family history of arthritis: Secondary | ICD-10-CM

## 2023-03-17 DIAGNOSIS — R069 Unspecified abnormalities of breathing: Secondary | ICD-10-CM | POA: Diagnosis not present

## 2023-03-17 DIAGNOSIS — R0902 Hypoxemia: Secondary | ICD-10-CM | POA: Diagnosis not present

## 2023-03-17 DIAGNOSIS — G2581 Restless legs syndrome: Secondary | ICD-10-CM | POA: Diagnosis not present

## 2023-03-17 DIAGNOSIS — Z720 Tobacco use: Secondary | ICD-10-CM | POA: Diagnosis present

## 2023-03-17 DIAGNOSIS — Z743 Need for continuous supervision: Secondary | ICD-10-CM | POA: Diagnosis not present

## 2023-03-17 DIAGNOSIS — R54 Age-related physical debility: Secondary | ICD-10-CM | POA: Diagnosis present

## 2023-03-17 DIAGNOSIS — Z515 Encounter for palliative care: Secondary | ICD-10-CM | POA: Diagnosis not present

## 2023-03-17 LAB — COMPREHENSIVE METABOLIC PANEL
ALT: 42 U/L (ref 0–44)
AST: 80 U/L — ABNORMAL HIGH (ref 15–41)
Albumin: 3.6 g/dL (ref 3.5–5.0)
Alkaline Phosphatase: 223 U/L — ABNORMAL HIGH (ref 38–126)
Anion gap: 17 — ABNORMAL HIGH (ref 5–15)
BUN: 57 mg/dL — ABNORMAL HIGH (ref 8–23)
CO2: 29 mmol/L (ref 22–32)
Calcium: 9 mg/dL (ref 8.9–10.3)
Chloride: 94 mmol/L — ABNORMAL LOW (ref 98–111)
Creatinine, Ser: 1.72 mg/dL — ABNORMAL HIGH (ref 0.44–1.00)
GFR, Estimated: 30 mL/min — ABNORMAL LOW (ref >60)
Glucose, Bld: 153 mg/dL — ABNORMAL HIGH (ref 70–99)
Potassium: 2.3 mmol/L — CL (ref 3.5–5.1)
Sodium: 140 mmol/L (ref 135–145)
Total Bilirubin: 1.6 mg/dL — ABNORMAL HIGH (ref 0.3–1.2)
Total Protein: 6.7 g/dL (ref 6.5–8.1)

## 2023-03-17 LAB — CBC WITH DIFFERENTIAL/PLATELET
Abs Immature Granulocytes: 0.33 10*3/uL — ABNORMAL HIGH (ref 0.00–0.07)
Basophils Absolute: 0 10*3/uL (ref 0.0–0.1)
Basophils Relative: 0 %
Eosinophils Absolute: 0.2 10*3/uL (ref 0.0–0.5)
Eosinophils Relative: 2 %
HCT: 45.7 % (ref 36.0–46.0)
Hemoglobin: 15.6 g/dL — ABNORMAL HIGH (ref 12.0–15.0)
Immature Granulocytes: 3 %
Lymphocytes Relative: 4 %
Lymphs Abs: 0.4 10*3/uL — ABNORMAL LOW (ref 0.7–4.0)
MCH: 30.8 pg (ref 26.0–34.0)
MCHC: 34.1 g/dL (ref 30.0–36.0)
MCV: 90.3 fL (ref 80.0–100.0)
Monocytes Absolute: 0.4 10*3/uL (ref 0.1–1.0)
Monocytes Relative: 4 %
Neutro Abs: 8.8 10*3/uL — ABNORMAL HIGH (ref 1.7–7.7)
Neutrophils Relative %: 87 %
Platelets: 81 10*3/uL — ABNORMAL LOW (ref 150–400)
RBC: 5.06 MIL/uL (ref 3.87–5.11)
RDW: 13 % (ref 11.5–15.5)
WBC: 10.2 10*3/uL (ref 4.0–10.5)
nRBC: 0.3 % — ABNORMAL HIGH (ref 0.0–0.2)

## 2023-03-17 LAB — BRAIN NATRIURETIC PEPTIDE: B Natriuretic Peptide: 477 pg/mL — ABNORMAL HIGH (ref 0.0–100.0)

## 2023-03-17 LAB — MAGNESIUM: Magnesium: 2.6 mg/dL — ABNORMAL HIGH (ref 1.7–2.4)

## 2023-03-17 LAB — TROPONIN I (HIGH SENSITIVITY)
Troponin I (High Sensitivity): 217 ng/L (ref <18)
Troponin I (High Sensitivity): 248 ng/L (ref <18)

## 2023-03-17 MED ORDER — IPRATROPIUM-ALBUTEROL 0.5-2.5 (3) MG/3ML IN SOLN
6.0000 mL | Freq: Once | RESPIRATORY_TRACT | Status: AC
  Administered 2023-03-17: 6 mL via RESPIRATORY_TRACT
  Filled 2023-03-17: qty 6

## 2023-03-17 MED ORDER — METHYLPREDNISOLONE SODIUM SUCC 125 MG IJ SOLR
125.0000 mg | Freq: Once | INTRAMUSCULAR | Status: AC
  Administered 2023-03-17: 125 mg via INTRAVENOUS
  Filled 2023-03-17: qty 2

## 2023-03-17 MED ORDER — ASPIRIN 325 MG PO TABS
325.0000 mg | ORAL_TABLET | Freq: Once | ORAL | Status: AC
  Administered 2023-03-17: 325 mg via ORAL
  Filled 2023-03-17: qty 1

## 2023-03-17 MED ORDER — POTASSIUM CHLORIDE 10 MEQ/100ML IV SOLN
10.0000 meq | INTRAVENOUS | Status: AC
  Administered 2023-03-17 (×2): 10 meq via INTRAVENOUS
  Filled 2023-03-17 (×2): qty 100

## 2023-03-17 MED ORDER — IOHEXOL 350 MG/ML SOLN
60.0000 mL | Freq: Once | INTRAVENOUS | Status: AC | PRN
  Administered 2023-03-17: 60 mL via INTRAVENOUS

## 2023-03-17 NOTE — ED Provider Notes (Signed)
[facility] Provider Note    Event Date/Time   First MD Initiated Contact with Patient 03/17/23 1900     (approximate)   History   Shortness of Breath   HPI  Mariah [name] is a 77 y.o. female with history of hypertension, CKD, COPD, CHF presenting to the emergency department for evaluation of weakness and shortness of breath.  Found that her facility by EMS with a room air saturation of 85%, placed on supplemental oxygen via nasal cannula.  Gave 1 DuoNeb and route.  Vitals otherwise stable.  Patient reports she is felt generally weak for several days now.  Accompanied by her niece who states she has had ongoing decline.  No fevers or chills.  No chest pain.     Physical Exam   Triage Vital Signs: ED Triage Vitals  Encounter Vitals Group     BP 03/17/23 1855 113/83     Systolic BP Percentile --      Diastolic BP Percentile --      Pulse Rate 03/17/23 1855 84     Resp 03/17/23 1855 13     Temp 03/17/23 1857 98.6 F (37 C)     Temp Source 03/17/23 1857 Oral     SpO2 03/17/23 1851 96 %     Weight --      Height --      Head Circumference --      Peak Flow --      Pain Score 03/17/23 1857 0     Pain Loc --      Pain Education --      Exclude from Growth Chart --     Most recent vital signs: Vitals:   03/17/23 2300 03/17/23 2325  BP: 106/82   Pulse: 82   Resp: (!) 23   Temp:  98.5 F (36.9 C)  SpO2: 95%      General: Awake, interactive  CV:  Regular rate, good peripheral perfusion.  Resp:  Lung sounds coarse and diminished bilaterally, respirations not significantly labored Abd:  Soft, nondistended, nontender to palpation Neuro:  Symmetric facial movement, fluid speech, symmetric weakness, more prominent in the lower extremities, appropriately answering orientation questions   ED Results / Procedures / Treatments   Labs (all labs ordered are listed, but only abnormal results are displayed) Labs Reviewed  COMPREHENSIVE METABOLIC  PANEL - Abnormal; Notable for the following components:      Result Value   Potassium 2.3 (*)    Chloride 94 (*)    Glucose, Bld 153 (*)    BUN 57 (*)    Creatinine, Ser 1.72 (*)    AST 80 (*)    Alkaline Phosphatase 223 (*)    Total Bilirubin 1.6 (*)    GFR, Estimated 30 (*)    Anion gap 17 (*)    All other components within normal limits  CBC WITH DIFFERENTIAL/PLATELET - Abnormal; Notable for the following components:   Hemoglobin 15.6 (*)    Platelets 81 (*)    nRBC 0.3 (*)    Neutro Abs 8.8 (*)    Lymphs Abs 0.4 (*)    Abs Immature Granulocytes 0.33 (*)    All other components within normal limits  BRAIN NATRIURETIC PEPTIDE - Abnormal; Notable for the following components:   B Natriuretic Peptide 477.0 (*)    All other components within normal limits  MAGNESIUM - Abnormal; Notable for the following components:   Magnesium 2.6 (*)    All other  components within normal limits  TROPONIN I (HIGH SENSITIVITY) - Abnormal; Notable for the following components:   Troponin I (High Sensitivity) 217 (*)    All other components within normal limits  TROPONIN I (HIGH SENSITIVITY) - Abnormal; Notable for the following components:   Troponin I (High Sensitivity) 248 (*)    All other components within normal limits  URINALYSIS, W/ REFLEX TO CULTURE (INFECTION SUSPECTED)     EKG EKG independently reviewed interpreted by myself (ER attending) demonstrates:  EKG demonstrates sinus tachycardia at a rate of 116, PR 103, QRS 77, QTc 514, no acute ST changes  RADIOLOGY Imaging independently reviewed and interpreted by myself demonstrates:  CT head without acute bleed, possible metastatic lesions noted by radiology Chest x-Sara Keys with possible mass, CT recommended by radiology CT of the chest does demonstrate a lung mass with likely associated metastatic lesions, no noted pulmonary embolism  PROCEDURES:  Critical Care performed: Yes, see critical care procedure note(s)  CRITICAL  CARE Performed by: Trinna Post   Total critical care time: 35 minutes  Critical care time was exclusive of separately billable procedures and treating other patients.  Critical care was necessary to treat or prevent imminent or life-threatening deterioration.  Critical care was time spent personally by me on the following activities: development of treatment plan with patient and/or surrogate as well as nursing, discussions with consultants, evaluation of patient's response to treatment, examination of patient, obtaining history from patient or surrogate, ordering and performing treatments and interventions, ordering and review of laboratory studies, ordering and review of radiographic studies, pulse oximetry and re-evaluation of patient's condition.   Procedures   MEDICATIONS ORDERED IN ED: Medications  methylPREDNISolone sodium succinate (SOLU-MEDROL) 125 mg/2 mL injection 125 mg (125 mg Intravenous Given 03/17/23 2036)  ipratropium-albuterol (DUONEB) 0.5-2.5 (3) MG/3ML nebulizer solution 6 mL (6 mLs Nebulization Given 03/17/23 2038)  aspirin tablet 325 mg (325 mg Oral Given 03/17/23 2046)  potassium chloride 10 mEq in 100 mL IVPB (0 mEq Intravenous Stopped 03/17/23 2250)  iohexol (OMNIPAQUE) 350 MG/ML injection 60 mL (60 mLs Intravenous Contrast Given 03/17/23 2015)     IMPRESSION / MDM / ASSESSMENT AND PLAN / ED COURSE  I reviewed the triage vital signs and the nursing notes.  Differential diagnosis includes, but is not limited to, anemia, electrolyte abnormality, pneumonia, PE, COPD exacerbation, CHF exacerbation  Patient's presentation is most consistent with acute presentation with potential threat to life or bodily function.  77 year old female presenting to the emergency department for evaluation of shortness of breath and weakness.  New 2 L O2 requirement, vitals otherwise stable.  Labs with multiple derangements including significant hypokalemia with a K of 2.3, elevated troponin  at 217.  Ordered for aspirin.  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  With her other lab derangements, do think patient is appropriate for admission.  Updated on the results of her workup.  Will reach to hospitalist team.  Reviewed with Dr. Allena Katz.  She will evaluate the patient for anticipated admission.     FINAL CLINICAL IMPRESSION(S) / ED DIAGNOSES   Final diagnoses:  SOB (shortness of breath)  Hypokalemia  Lung mass  Elevated troponin     Rx / DC Orders   ED Discharge Orders     None        Note:  This document was prepared using Dragon voice recognition software and may include unintentional dictation errors.   Trinna Post, MD 03/18/23 [phone]

## 2023-03-17 NOTE — ED Triage Notes (Signed)
Pt from [facility] via [facility] for staff report of lethargy and SOB. Hx of CHF, COPD, dementia, pulmonary HTN. Pt denies any chief complaint. Cough present, unsure of how long. EMS found pt at 85% RA, placed on 3L [place] to be at 94%. One duoneb given with improvement to lung sounds. EMS vitals: BP 124/73 HR 78 CBG 138 38 end tidal RR 15-18

## 2023-03-17 NOTE — H&P (Signed)
History and Physical    Patient: Mariah [name] BJY:[id number] DOB: Aug 15, 1945 DOA: 03/17/2023 DOS: the patient was seen and examined on 03/18/2023 PCP: Erasmo Downer, MD  Cardiology: [facility]  Patient coming from: Home  Chief Complaint:  Chief Complaint  Patient presents with   Shortness of Breath    HPI: Mariah FROELICH is a 77 y.o. female with medical history significant for Abdominal aortic aneurysm (AAA) (HCC), Arthritis, CAP (community acquired pneumonia) (06/03/2020), Chronic diastolic heart failure (HCC), COPD (chronic obstructive pulmonary disease) (HCC), GERD (gastroesophageal reflux disease), Headache, History of adenomatous polyp of colon, Hyperlipidemia, Hypertension, Legionella infection (HCC) (07/17/2020), Psoriasis, Right lumbar radiculitis, and Vitamin D deficiency brought from nursing facility for shortness of breath, not breathing right.  Patient is an elderly frail-appearing female coming with shortness of breath.  Patient states been happening for about 3 days.  In the emergency room patient is alert awake oriented.  Intermittently falls asleep.  Patient was otherwise stable.  Has a skin rash which I was concerned about shingles but patient states that his psoriasis.  In the emergency room initial EKG shows sinus tach at 116 PR of 103 QTc prolonged at 514 history of left bundle branch block no acute ST changes.  No complaints of chest pain.  Workup showed elevated troponin of 217 rising to 248, elevated BNP at 477. Metabolic panel showed potassium of 2.3 which was replaced in the emergency room, abnormal creatinine of 1.72 which is AKI and patient has had intermittent abnormal kidney function in the past.  Anion gap of 17, lactic ordered and pending.  CBC shows normal white count thrombocytopenia of 81,000.  Glucose of 153.  Chart review shows that on August 12 the patient had an abnormal urinalysis showing large leukocytes with a positive urine culture for pansensitive E. coli.   Chart review shows that in March patient had an ultrasound of the kidneys that showed unremarkable kidneys and bladder with incidental accessory spleen and cholelithiasis.  Patient had a CT chest PE protocol today and unfortunately found a large left hilar and mediastinal mass that is concerning for malignancy with metastatic lymphadenopathy with mets to the liver, small left pleural effusion, aortic aneurysm of 4 cm.  Patient's prognosis is terminal if this is metastatic especially due to her frail health.  Goals of care discussion with a.m. team I have contacted family and left voicemails however patient is currently full code.  Review of Systems: Review of Systems  Respiratory:  Positive for shortness of breath.   Cardiovascular:  Positive for leg swelling.  All other systems reviewed and are negative.  Past Medical History:  Diagnosis Date   Abdominal aortic aneurysm (AAA) (HCC)    Arthritis    CAP (community acquired pneumonia) 06/03/2020   Chronic diastolic heart failure (HCC)    COPD (chronic obstructive pulmonary disease) (HCC)    GERD (gastroesophageal reflux disease)    Headache    sinus   History of adenomatous polyp of colon    Hyperlipidemia    Hypertension    Legionella infection (HCC) 07/17/2020   Psoriasis    Right lumbar radiculitis    Vitamin D deficiency    Past Surgical History:  Procedure Laterality Date   APPENDECTOMY  2000   BREAST BIOPSY Right 09/2016   radial scar;COMPLEX SCLEROSING LESION   BREAST LUMPECTOMY Right 11/24/2016   COMPLEX SCLEROSING LESION   BREAST LUMPECTOMY WITH NEEDLE LOCALIZATION Right 11/24/2016   Procedure: BREAST LUMPECTOMY WITH NEEDLE LOCALIZATION;  Surgeon: Katrinka Blazing,  Elaina Pattee, MD;  Location: [facility] ORS;  Service: General;  Laterality: Right;   COLONOSCOPY WITH PROPOFOL N/A 08/10/2016   Procedure: COLONOSCOPY WITH PROPOFOL;  Surgeon: Scot Jun, MD;  Location: [facility] ENDOSCOPY;  Service: Endoscopy;  Laterality: N/A;    ESOPHAGOGASTRODUODENOSCOPY (EGD) WITH PROPOFOL N/A 08/10/2016   Procedure: ESOPHAGOGASTRODUODENOSCOPY (EGD) WITH PROPOFOL;  Surgeon: Scot Jun, MD;  Location: [facility] ENDOSCOPY;  Service: Endoscopy;  Laterality: N/A;   FOOT SURGERY Bilateral    JOINT REPLACEMENT Right 01/27/2016   hip   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 She has a 60 pack-year smoking history. She has never used smokeless tobacco. She reports current alcohol use. She reports that she does not use drugs.  Allergies  Allergen Reactions   Amoxicillin Diarrhea   Clindamycin/Lincomycin     diarrhea    Family History  Problem Relation Age of Onset   Cancer Mother        kidney   Arthritis Father    Heart disease Father    COPD Sister    Fibromyalgia Sister    Osteoarthritis Sister    Osteoarthritis Maternal Aunt    Colon cancer Paternal Aunt    Colon cancer Paternal Uncle    Colon cancer Paternal Grandmother    Breast cancer Neg Hx     Prior to Admission medications   Medication Sig Start Date End Date Taking? Authorizing Provider  albuterol (VENTOLIN HFA) 108 (90 Base) MCG/ACT inhaler USE 2 PUFFS BY MOUTH EVERY 6 HOURS AS NEEDED FOR SHORTNESS OF BREATH. 09/22/21   Erasmo Downer, MD  aspirin EC 81 MG tablet Take 81 mg by mouth daily. Swallow whole.    [provider]  augmented betamethasone dipropionate (DIPROLENE-AF) 0.05 % ointment APPLY TOPICALLY TO AFFECTED AREA ONCE A DAY. 12/29/21   Bacigalupo, Marzella Schlein, MD  calcipotriene (DOVONOX) 0.005 % cream APPLY TO SKIN ONCE A DAY APPLY DAILY ON HANDS, FEET AND WRIST X 2 WEEKS, THEN USE 5 DAYS A WEEK Patient not taking: Reported on 07/02/2022 01/01/20   Deirdre Evener, MD  Calcipotriene 0.005 % solution APPLY 1 A SMALL  AMOUNT TO SCALP ONCE A DAY APPLY TO SCALP DAILY FOR 2 WEEKS, THEN USE ON SCALP 5 DAYS A WEEK 09/02/20   Deirdre Evener, MD  Cholecalciferol (VITAMIN D3) 2000 units TABS Take 2,000 Units by mouth daily.    [provider]  clobetasol (TEMOVATE) 0.05 % external solution APPLY TOPICALLY TO SCALP AREA TWICE A DAY. 02/08/23   Bacigalupo, Marzella Schlein, MD  COSENTYX SENSOREADY, 300 MG, 150 MG/ML SOAJ Inject 2 Syringes into the skin every 28 (twenty-eight) days. 05/31/20   [provider]  donepezil (ARICEPT) 10 MG tablet Take 10 mg by mouth daily. 07/01/20   [provider]  fluticasone furoate-vilanterol (BREO ELLIPTA) 100-25 MCG/ACT AEPB USE ONE INHALATION BY MOUTH ONCE A DAY. RINSE MOUTH AFTER EACH USE. 01/20/23   Bacigalupo, Marzella Schlein, MD  folic acid (FOLVITE) 1 MG tablet Take 1 mg by mouth daily. 09/02/20   [provider]  furosemide (LASIX) 20 MG tablet Take 1 tablet (20 mg total) by mouth daily. 08/13/22   Erasmo Downer, MD  gabapentin (NEURONTIN) 300 MG capsule TAKE (1) CAPSULE BY MOUTH TWICE  A DAY. (MAY OPEN & SPRINKLE ON FOOD.) 11/13/22   Erasmo Downer, MD  levocetirizine (XYZAL) 5 MG tablet TAKE (1) TABLET BY MOUTH ONCE A DAY IN THE EVENING 09/17/21   Bacigalupo, Marzella Schlein, MD  memantine (NAMENDA) 5 MG tablet Take 1 tablet by mouth daily. 06/23/21   [provider]  metoprolol succinate (TOPROL-XL) 25 MG 24 hr tablet Take 1 tablet (25 mg total) by mouth daily. 11/25/21   Erasmo Downer, MD  oxyCODONE (OXY IR/ROXICODONE) 5 MG immediate release tablet Take by mouth. 08/07/21   [provider]  pantoprazole (PROTONIX) 20 MG tablet TAKE 1 TABLET (20 MG TOTAL) BY MOUTH 2 (TWO) TIMES DAILY BEFORE A MEAL. 06/26/20   Bacigalupo, Marzella Schlein, MD  rOPINIRole (REQUIP) 1 MG tablet TAKE 1 TABLET BY MOUTH EVERYDAY AT BEDTIME 04/05/21   Bacigalupo, Marzella Schlein, MD  traZODone (DESYREL) 50 MG tablet Take 0.5-1 tablets (25-50 mg total) by mouth at bedtime as needed  for sleep. 03/01/23   Erasmo Downer, MD  TREMFYA 100 MG/ML pen Inject 100 mg subcutaneously every 8 (eight) weeks 01/28/22   [provider]     Vitals:   03/18/23 0000 03/18/23 0030 03/18/23 0130 03/18/23 0230  BP: 120/79 92/73 109/65 136/82  Pulse: 85 94 94 70  Resp: 15 18 13 14   Temp:    98.2 F (36.8 C)  TempSrc:    Oral  SpO2: 94% 94% 94% 95%  Weight:    58.4 kg  Height:    5\' 1"  (1.549 m)   Physical Exam Vitals and nursing note reviewed.  Constitutional:      General: She is not in acute distress.    Appearance: She is ill-appearing.     Interventions: Nasal cannula in place.  HENT:     Head: Normocephalic and atraumatic.     Right Ear: Hearing normal.     Left Ear: Hearing normal.     Nose: Nose normal. No nasal deformity.     Mouth/Throat:     Lips: Pink.     Tongue: No lesions.     Pharynx: Oropharynx is clear.  Eyes:     General: Lids are normal.     Extraocular Movements: Extraocular movements intact.  Cardiovascular:     Rate and Rhythm: Normal rate and regular rhythm.     Heart sounds: Normal heart sounds.  Pulmonary:     Effort: Pulmonary effort is normal.     Breath sounds: Wheezing present.  Abdominal:     General: Bowel sounds are normal. There is no distension.     Palpations: Abdomen is soft. There is no mass.     Tenderness: There is no abdominal tenderness.  Musculoskeletal:     Right lower leg: Edema present.     Left lower leg: Edema present.  Skin:    General: Skin is warm.  Neurological:     General: No focal deficit present.     Mental Status: She is alert and oriented to person, place, and time.     Cranial Nerves: Cranial nerves 2-12 are intact.  Psychiatric:        Attention and Perception: Attention normal.        Mood and Affect: Mood normal.        Speech: Speech normal.        Behavior: Behavior normal. Behavior is cooperative.     Labs on Admission: I have personally reviewed following labs and imaging  studies  CBC:  Recent Labs  Lab 03/17/23 1932  WBC 10.2  NEUTROABS 8.8*  HGB 15.6*  HCT 45.7  MCV 90.3  PLT 81*   Basic Metabolic Panel: Recent Labs  Lab 03/17/23 1932  NA 140  K 2.3*  CL 94*  CO2 29  GLUCOSE 153*  BUN 57*  CREATININE 1.72*  CALCIUM 9.0  MG 2.6*   GFR: Estimated Creatinine Clearance: 22.1 mL/min (A) (by C-G formula based on SCr of 1.72 mg/dL (H)). Liver Function Tests: Recent Labs  Lab 03/17/23 1932  AST 80*  ALT 42  ALKPHOS 223*  BILITOT 1.6*  PROT 6.7  ALBUMIN 3.6   No results for input(s): "LIPASE", "AMYLASE" in the last 168 hours. No results for input(s): "AMMONIA" in the last 168 hours. Coagulation Profile: No results for input(s): "INR", "PROTIME" in the last 168 hours. Cardiac Enzymes: No results for input(s): "CKTOTAL", "CKMB", "CKMBINDEX", "TROPONINI" in the last 168 hours. BNP (last 3 results) No results for input(s): "PROBNP" in the last 8760 hours. HbA1C: No results for input(s): "HGBA1C" in the last 72 hours. CBG: No results for input(s): "GLUCAP" in the last 168 hours. Lipid Profile: No results for input(s): "CHOL", "HDL", "LDLCALC", "TRIG", "CHOLHDL", "LDLDIRECT" in the last 72 hours. Thyroid Function Tests: No results for input(s): "TSH", "T4TOTAL", "FREET4", "T3FREE", "THYROIDAB" in the last 72 hours. Anemia Panel: No results for input(s): "VITAMINB12", "FOLATE", "FERRITIN", "TIBC", "IRON", "RETICCTPCT" in the last 72 hours. Urinalysis    Component Value Date/Time   COLORURINE YELLOW (A) 08/08/2021 1319   APPEARANCEUR TURBID (A) 08/08/2021 1319   LABSPEC 1.014 08/08/2021 1319   PHURINE 9.0 (H) 08/08/2021 1319   GLUCOSEU NEGATIVE 08/08/2021 1319   HGBUR SMALL (A) 08/08/2021 1319   BILIRUBINUR Negative 03/01/2023 1003   KETONESUR NEGATIVE 08/08/2021 1319   PROTEINUR Negative 03/01/2023 1003   PROTEINUR >=300 (A) 08/08/2021 1319   UROBILINOGEN 0.2 03/01/2023 1003   NITRITE Positive 03/01/2023 1003   NITRITE  POSITIVE (A) 08/08/2021 1319   LEUKOCYTESUR Large (3+) (A) 03/01/2023 1003   LEUKOCYTESUR MODERATE (A) 08/08/2021 1319   Unresulted Labs (From admission, onward)     Start     Ordered   03/18/23 0500  CBC  Tomorrow morning,   R        03/18/23 0033   03/18/23 0500  Comprehensive metabolic panel  Tomorrow morning,   R        03/18/23 0033   03/18/23 0256  Urinalysis, Routine w reflex microscopic -Urine, Random  Add-on,   AD       Question:  Specimen Source  Answer:  Urine, Random   03/18/23 0255   03/18/23 0249  Lactic acid, plasma  (Lactic Acid)  ONCE - STAT,   STAT        03/18/23 0248   03/17/23 1932  Urinalysis, w/ Reflex to Culture (Infection Suspected) -Urine, Clean Catch  Once,   URGENT       Question:  Specimen Source  Answer:  Urine, Clean Catch   03/17/23 1931            Medications  aspirin EC tablet 81 mg (has no administration in time range)  fluticasone furoate-vilanterol (BREO ELLIPTA) 100-25 MCG/ACT 1 puff (has no administration in time range)  gabapentin (NEURONTIN) capsule 300 mg (has no administration in time range)  metoprolol succinate (TOPROL-XL) 24 hr tablet 25 mg (has no administration in time range)  traZODone (DESYREL) tablet 50 mg (has no administration in time range)  memantine (NAMENDA) tablet 5  mg (has no administration in time range)  methylPREDNISolone sodium succinate (SOLU-MEDROL) 125 mg/2 mL injection 60 mg (has no administration in time range)    Followed by  predniSONE (DELTASONE) tablet 40 mg (has no administration in time range)  ipratropium-albuterol (DUONEB) 0.5-2.5 (3) MG/3ML nebulizer solution 3 mL (has no administration in time range)  albuterol (PROVENTIL) (2.5 MG/3ML) 0.083% nebulizer solution 2.5 mg (has no administration in time range)  sodium chloride flush (NS) 0.9 % injection 3 mL (3 mLs Intravenous Given 03/18/23 0250)  lactated ringers infusion ( Intravenous New Bag/Given 03/18/23 0249)  acetaminophen (TYLENOL) tablet 650 mg  (has no administration in time range)    Or  acetaminophen (TYLENOL) suppository 650 mg (has no administration in time range)  HYDROcodone-acetaminophen (NORCO/VICODIN) 5-325 MG per tablet 1-2 tablet (has no administration in time range)  morphine (PF) 2 MG/ML injection 2 mg (has no administration in time range)  zolpidem (AMBIEN) tablet 5 mg (has no administration in time range)  bisacodyl (DULCOLAX) EC tablet 5 mg (has no administration in time range)  ondansetron (ZOFRAN) tablet 4 mg (has no administration in time range)    Or  ondansetron (ZOFRAN) injection 4 mg (has no administration in time range)  guaiFENesin (MUCINEX) 12 hr tablet 600 mg (has no administration in time range)  nicotine (NICODERM CQ - dosed in mg/24 hours) patch 14 mg (14 mg Transdermal Patch Applied 03/18/23 0242)  hydrALAZINE (APRESOLINE) injection 5 mg (has no administration in time range)  heparin injection 5,000 Units (has no administration in time range)  donepezil (ARICEPT) tablet 10 mg (has no administration in time range)  folic acid (FOLVITE) tablet 1 mg (has no administration in time range)  methylPREDNISolone sodium succinate (SOLU-MEDROL) 125 mg/2 mL injection 125 mg (125 mg Intravenous Given 03/17/23 2036)  ipratropium-albuterol (DUONEB) 0.5-2.5 (3) MG/3ML nebulizer solution 6 mL (6 mLs Nebulization Given 03/17/23 2038)  aspirin tablet 325 mg (325 mg Oral Given 03/17/23 2046)  potassium chloride 10 mEq in 100 mL IVPB (0 mEq Intravenous Stopped 03/17/23 2250)  iohexol (OMNIPAQUE) 350 MG/ML injection 60 mL (60 mLs Intravenous Contrast Given 03/17/23 2015)    Radiological Exams on Admission: CT Angio Chest PE W and/or Wo Contrast  Result Date: 03/17/2023 CLINICAL DATA:  Pulmonary embolus suspected with high probability. Follow-up abnormal left hilum on radiographs. Lethargy and shortness of breath. EXAM: CT ANGIOGRAPHY CHEST WITH CONTRAST TECHNIQUE: Multidetector CT imaging of the chest was performed using the  standard protocol during bolus administration of intravenous contrast. Multiplanar CT image reconstructions and MIPs were obtained to evaluate the vascular anatomy. RADIATION DOSE REDUCTION: This exam was performed according to the departmental dose-optimization program which includes automated exposure control, adjustment of the mA and/or kV according to patient size and/or use of iterative reconstruction technique. CONTRAST:  60mL OMNIPAQUE IOHEXOL 350 MG/ML SOLN COMPARISON:  Chest radiograph 03/17/2023.  CT chest 06/02/2020 FINDINGS: Cardiovascular: Good opacification of the central, segmental, and pulmonary arteries. No focal filling defects. No evidence of significant pulmonary embolus. Mild cardiac enlargement. Small pericardial effusion. Ascending aortic aneurysm measuring 4 cm diameter. No dissection. Calcification of the aorta and coronary arteries. Mediastinum/Nodes: Thyroid gland is unremarkable. Esophagus is decompressed. Secretions in the trachea. There is extensive left hilar and perihilar mass or lymphadenopathy with large mass extending into the anterior left hilum, aortopulmonic window region, and anterior mediastinum. At greatest extent, the mass measures 4.6 x 8.2 cm diameter. There is also mediastinal lymphadenopathy with subcarinal lymph nodes measuring up to 2.3 cm diameter.  These changes are progressed since the previous study and likely represent malignancy. Lungs/Pleura: Small left pleural effusion with basilar atelectasis. Postobstructive consolidation or atelectasis in the left lingula. Right lung is clear. Diffuse emphysematous changes bilaterally. Upper Abdomen: Hepatic cirrhosis. Heterogeneous vague nodularity of the liver is suspicious for diffuse liver metastasis. Regenerative nodules would be a less likely consideration. Spleen is enlarged. Musculoskeletal: Degenerative changes in the spine. No focal bone lesions Review of the MIP images confirms the above findings. IMPRESSION: 1.  Large left hilar and mediastinal mass is likely malignant. Probably metastatic mediastinal lymph nodes. 2. Heterogeneous nodular appearance of the liver likely representing diffuse liver metastasis. The liver does appear cirrhotic and regenerative nodules would be a less likely consideration. 3. Small left pleural effusion with basilar atelectasis. Likely postobstructive changes in the left lingula. 4. Aortic aneurysm measuring 4 cm diameter. Aortic atherosclerosis. Recommend annual imaging followup by CTA or MRA. This recommendation follows 2010 ACCF/AHA/AATS/ACR/ASA/SCA/SCAI/SIR/STS/SVM Guidelines for the Diagnosis and Management of Patients with Thoracic Aortic Disease. Circulation. 2010; 121: A213-Y865. Aortic aneurysm NOS (ICD10-I71.9) 5. Emphysematous changes in the lungs. Electronically Signed   By: Burman Nieves M.D.   On: 03/17/2023 20:46   CT Head Wo Contrast  Result Date: 03/17/2023 CLINICAL DATA:  Mental status change EXAM: CT HEAD WITHOUT CONTRAST TECHNIQUE: Contiguous axial images were obtained from the base of the skull through the vertex without intravenous contrast. RADIATION DOSE REDUCTION: This exam was performed according to the departmental dose-optimization program which includes automated exposure control, adjustment of the mA and/or kV according to patient size and/or use of iterative reconstruction technique. COMPARISON:  Head CT 09/17/2020 FINDINGS: Brain: No evidence of acute infarction, hemorrhage, hydrocephalus, extra-axial collection or mass lesion/mass effect. Again seen is moderate diffuse atrophy. Vascular: No hyperdense vessel or unexpected calcification. Skull: No acute fracture identified. There are new multiple lytic skull lesions most significant in the right parietal bone where there is intracranial and extracranial soft tissue component. This lesion measures 1.9 x 1.0 cm on image 2/54. Sinuses/Orbits: No acute finding. Other: None. IMPRESSION: 1. No acute intracranial  abnormality. 2. There are new multiple lytic skull lesions most significant in the right parietal bone where there is intracranial and extracranial soft tissue component. This lesion measures 1.9 x 1.0 cm. Findings are suspicious for metastatic disease. Electronically Signed   By: Darliss Cheney M.D.   On: 03/17/2023 20:41   DG Chest Port 1 View  Result Date: 03/17/2023 CLINICAL DATA:  Shortness of breath and cough EXAM: PORTABLE CHEST 1 VIEW COMPARISON:  09/17/2020 FINDINGS: Atherosclerotic calcification of the aortic arch. Bandlike density in the left mid lung. Blunting of the left lateral costophrenic angle compatible with small to moderate left pleural effusion. Fullness of the left hilum/pulmonary arterial margin, cannot exclude adenopathy, mass, or local airspace opacity. Atherosclerotic calcification of the aortic arch.  Emphysema. IMPRESSION: 1. Fullness of the left hilum/pulmonary arterial margin, cannot exclude adenopathy, mass, or local airspace opacity. Chest CT with contrast is recommended for further characterization. 2. Small to moderate left pleural effusion. 3. Bandlike density in the left mid lung, probably atelectasis, cannot exclude pneumonia. 4. Aortic Atherosclerosis (ICD10-I70.0) and Emphysema (ICD10-J43.9). Electronically Signed   By: Gaylyn Rong M.D.   On: 03/17/2023 19:55     Data Reviewed: Relevant notes from primary care and specialist visits, past discharge summaries as available in EHR, including Care Everywhere. Prior diagnostic testing as pertinent to current admission diagnoses Updated medications and problem lists for reconciliation ED course, including vitals,  labs, imaging, treatment and response to treatment Triage notes, nursing and pharmacy notes and ED provider's notes Notable results as noted in HPI  Assessment and Plan: * SOB (shortness of breath) 2/2 large left lung and mediastinal mass concerning for malignancy with pleural effusion.  Cont with  supplemental oxygen as needed.  Oncology consult per Am team if pt and family want treatment for same.     Rash and nonspecific skin eruption Suspect Shingles pt states it is psoraisis. No lesion that I can culture. Currently no complaints of pruritus and pt does not have any tenderness and lesion are not in crops.    Tobacco abuse Nicotine patch.    COPD (chronic obstructive pulmonary disease) (HCC) Currently stable.  Patient was started on COPD exacerbation regimen with steroids in the emergency room although suspect patient's shortness of breath presentation secondary to her large lung mass. Will continue patient on a COPD regimen currently with a quick transition to prednisone p.o.  Congestive heart failure (HCC) Patient has a history of congestive heart failure.  Currently patient seems stable with mild pleural effusion and 1+ bilateral lower extremity edema.  As needed Lasix only with daily assessment of I's and O's and daily weights.  Continue metoprolol, aspirin 81.  CKD (chronic kidney disease), stage III (HCC) Lab Results  Component Value Date   CREATININE 1.72 (H) 03/17/2023   CREATININE 1.74 (H) 08/25/2022   CREATININE 1.38 (H) 08/05/2022  Stable will avoid contrast and renally dose medications.   Gastro-esophageal reflux disease without esophagitis IV PPI.  Swallow eval at bedside/ MBS if needed.  Essential (primary) hypertension Vitals:   03/17/23 1855 03/17/23 2030 03/17/23 2200 03/17/23 2300  BP: 113/83 126/68 108/70 106/82   03/18/23 0000 03/18/23 0030 03/18/23 0130 03/18/23 0230  BP: 120/79 92/73 109/65 136/82  Stable we will currently continue patient on metoprolol 25.  Hold patient's diuretic therapy.  As needed hydralazine.    Prognosis: Terminal .  DVT prophylaxis:  Heparin  Consults:  None  Advance Care Planning:    Code Status: Full Code   Family Communication:  None   Disposition Plan:  SNF  Severity of Illness: The appropriate  patient status for this patient is INPATIENT. Inpatient status is judged to be reasonable and necessary in order to provide the required intensity of service to ensure the patient's safety. The patient's presenting symptoms, physical exam findings, and initial radiographic and laboratory data in the context of their chronic comorbidities is felt to place them at high risk for further clinical deterioration. Furthermore, it is not anticipated that the patient will be medically stable for discharge from the hospital within 2 midnights of admission.   * I certify that at the point of admission it is my clinical judgment that the patient will require inpatient hospital care spanning beyond 2 midnights from the point of admission due to high intensity of service, high risk for further deterioration and high frequency of surveillance required.*  Author: Gertha Calkin, MD 03/18/2023 3:02 AM  For on call review [email or web address].

## 2023-03-18 DIAGNOSIS — N1832 Chronic kidney disease, stage 3b: Secondary | ICD-10-CM | POA: Diagnosis not present

## 2023-03-18 DIAGNOSIS — I5032 Chronic diastolic (congestive) heart failure: Secondary | ICD-10-CM | POA: Diagnosis present

## 2023-03-18 DIAGNOSIS — D696 Thrombocytopenia, unspecified: Secondary | ICD-10-CM | POA: Diagnosis present

## 2023-03-18 DIAGNOSIS — R7989 Other specified abnormal findings of blood chemistry: Secondary | ICD-10-CM

## 2023-03-18 DIAGNOSIS — C771 Secondary and unspecified malignant neoplasm of intrathoracic lymph nodes: Secondary | ICD-10-CM | POA: Diagnosis present

## 2023-03-18 DIAGNOSIS — F0280 Dementia in other diseases classified elsewhere without behavioral disturbance: Secondary | ICD-10-CM | POA: Diagnosis present

## 2023-03-18 DIAGNOSIS — R0602 Shortness of breath: Secondary | ICD-10-CM | POA: Diagnosis not present

## 2023-03-18 DIAGNOSIS — J9601 Acute respiratory failure with hypoxia: Secondary | ICD-10-CM | POA: Diagnosis present

## 2023-03-18 DIAGNOSIS — G309 Alzheimer's disease, unspecified: Secondary | ICD-10-CM | POA: Diagnosis present

## 2023-03-18 DIAGNOSIS — F17210 Nicotine dependence, cigarettes, uncomplicated: Secondary | ICD-10-CM | POA: Diagnosis present

## 2023-03-18 DIAGNOSIS — J449 Chronic obstructive pulmonary disease, unspecified: Secondary | ICD-10-CM

## 2023-03-18 DIAGNOSIS — R21 Rash and other nonspecific skin eruption: Secondary | ICD-10-CM | POA: Diagnosis present

## 2023-03-18 DIAGNOSIS — C799 Secondary malignant neoplasm of unspecified site: Secondary | ICD-10-CM | POA: Diagnosis not present

## 2023-03-18 DIAGNOSIS — Z720 Tobacco use: Secondary | ICD-10-CM | POA: Diagnosis not present

## 2023-03-18 DIAGNOSIS — N179 Acute kidney failure, unspecified: Secondary | ICD-10-CM | POA: Diagnosis present

## 2023-03-18 DIAGNOSIS — N1831 Chronic kidney disease, stage 3a: Secondary | ICD-10-CM | POA: Diagnosis not present

## 2023-03-18 DIAGNOSIS — E785 Hyperlipidemia, unspecified: Secondary | ICD-10-CM | POA: Diagnosis present

## 2023-03-18 DIAGNOSIS — J90 Pleural effusion, not elsewhere classified: Secondary | ICD-10-CM | POA: Diagnosis present

## 2023-03-18 DIAGNOSIS — Z515 Encounter for palliative care: Secondary | ICD-10-CM

## 2023-03-18 DIAGNOSIS — Z66 Do not resuscitate: Secondary | ICD-10-CM | POA: Diagnosis present

## 2023-03-18 DIAGNOSIS — I130 Hypertensive heart and chronic kidney disease with heart failure and stage 1 through stage 4 chronic kidney disease, or unspecified chronic kidney disease: Secondary | ICD-10-CM | POA: Diagnosis present

## 2023-03-18 DIAGNOSIS — K219 Gastro-esophageal reflux disease without esophagitis: Secondary | ICD-10-CM | POA: Diagnosis present

## 2023-03-18 DIAGNOSIS — R918 Other nonspecific abnormal finding of lung field: Secondary | ICD-10-CM

## 2023-03-18 DIAGNOSIS — E876 Hypokalemia: Secondary | ICD-10-CM | POA: Diagnosis present

## 2023-03-18 DIAGNOSIS — E440 Moderate protein-calorie malnutrition: Secondary | ICD-10-CM | POA: Diagnosis present

## 2023-03-18 DIAGNOSIS — L4050 Arthropathic psoriasis, unspecified: Secondary | ICD-10-CM | POA: Diagnosis present

## 2023-03-18 DIAGNOSIS — Z8249 Family history of ischemic heart disease and other diseases of the circulatory system: Secondary | ICD-10-CM | POA: Diagnosis not present

## 2023-03-18 DIAGNOSIS — C3402 Malignant neoplasm of left main bronchus: Secondary | ICD-10-CM | POA: Diagnosis present

## 2023-03-18 DIAGNOSIS — N183 Chronic kidney disease, stage 3 unspecified: Secondary | ICD-10-CM | POA: Diagnosis present

## 2023-03-18 DIAGNOSIS — I10 Essential (primary) hypertension: Secondary | ICD-10-CM | POA: Diagnosis not present

## 2023-03-18 DIAGNOSIS — I714 Abdominal aortic aneurysm, without rupture, unspecified: Secondary | ICD-10-CM | POA: Diagnosis present

## 2023-03-18 DIAGNOSIS — J441 Chronic obstructive pulmonary disease with (acute) exacerbation: Secondary | ICD-10-CM | POA: Diagnosis not present

## 2023-03-18 DIAGNOSIS — G2581 Restless legs syndrome: Secondary | ICD-10-CM | POA: Diagnosis present

## 2023-03-18 DIAGNOSIS — C787 Secondary malignant neoplasm of liver and intrahepatic bile duct: Secondary | ICD-10-CM | POA: Diagnosis present

## 2023-03-18 LAB — COMPREHENSIVE METABOLIC PANEL
ALT: 41 U/L (ref 0–44)
AST: 73 U/L — ABNORMAL HIGH (ref 15–41)
Albumin: 3.4 g/dL — ABNORMAL LOW (ref 3.5–5.0)
Alkaline Phosphatase: 220 U/L — ABNORMAL HIGH (ref 38–126)
Anion gap: 17 — ABNORMAL HIGH (ref 5–15)
BUN: 58 mg/dL — ABNORMAL HIGH (ref 8–23)
CO2: 30 mmol/L (ref 22–32)
Calcium: 8.9 mg/dL (ref 8.9–10.3)
Chloride: 91 mmol/L — ABNORMAL LOW (ref 98–111)
Creatinine, Ser: 1.68 mg/dL — ABNORMAL HIGH (ref 0.44–1.00)
GFR, Estimated: 31 mL/min — ABNORMAL LOW (ref >60)
Glucose, Bld: 176 mg/dL — ABNORMAL HIGH (ref 70–99)
Potassium: 2.7 mmol/L — CL (ref 3.5–5.1)
Sodium: 138 mmol/L (ref 135–145)
Total Bilirubin: 1.9 mg/dL — ABNORMAL HIGH (ref 0.3–1.2)
Total Protein: 6.4 g/dL — ABNORMAL LOW (ref 6.5–8.1)

## 2023-03-18 LAB — LACTIC ACID, PLASMA: Lactic Acid, Venous: 1.6 mmol/L (ref 0.5–1.9)

## 2023-03-18 LAB — TROPONIN I (HIGH SENSITIVITY)
Troponin I (High Sensitivity): 221 ng/L (ref <18)
Troponin I (High Sensitivity): 186 ng/L (ref <18)

## 2023-03-18 LAB — CBC
HCT: 41.9 % (ref 36.0–46.0)
Hemoglobin: 14.5 g/dL (ref 12.0–15.0)
MCH: 31 pg (ref 26.0–34.0)
MCHC: 34.6 g/dL (ref 30.0–36.0)
MCV: 89.7 fL (ref 80.0–100.0)
Platelets: 67 10*3/uL — ABNORMAL LOW (ref 150–400)
RBC: 4.67 MIL/uL (ref 3.87–5.11)
RDW: 13.1 % (ref 11.5–15.5)
WBC: 8.3 10*3/uL (ref 4.0–10.5)
nRBC: 0.2 % (ref 0.0–0.2)

## 2023-03-18 MED ORDER — IPRATROPIUM-ALBUTEROL 0.5-2.5 (3) MG/3ML IN SOLN
3.0000 mL | Freq: Two times a day (BID) | RESPIRATORY_TRACT | Status: DC
  Administered 2023-03-18 – 2023-03-23 (×9): 3 mL via RESPIRATORY_TRACT
  Filled 2023-03-18 (×10): qty 3

## 2023-03-18 MED ORDER — BISACODYL 5 MG PO TBEC: 5.0000 mg | DELAYED_RELEASE_TABLET | Freq: Every day | ORAL | Status: DC | PRN

## 2023-03-18 MED ORDER — ENSURE ENLIVE PO LIQD
237.0000 mL | Freq: Two times a day (BID) | ORAL | Status: DC
  Administered 2023-03-19 – 2023-03-22 (×6): 237 mL via ORAL

## 2023-03-18 MED ORDER — ALBUTEROL SULFATE (2.5 MG/3ML) 0.083% IN NEBU
2.5000 mg | INHALATION_SOLUTION | RESPIRATORY_TRACT | Status: DC | PRN
  Administered 2023-03-20 – 2023-03-21 (×3): 2.5 mg via RESPIRATORY_TRACT
  Filled 2023-03-18 (×3): qty 3

## 2023-03-18 MED ORDER — ADULT MULTIVITAMIN W/MINERALS CH
1.0000 | ORAL_TABLET | Freq: Every day | ORAL | Status: DC
  Administered 2023-03-19 – 2023-03-23 (×5): 1 via ORAL
  Filled 2023-03-18 (×5): qty 1

## 2023-03-18 MED ORDER — MORPHINE SULFATE (PF) 2 MG/ML IV SOLN
2.0000 mg | INTRAVENOUS | Status: DC | PRN
  Administered 2023-03-18 – 2023-03-23 (×11): 2 mg via INTRAVENOUS
  Filled 2023-03-18 (×11): qty 1

## 2023-03-18 MED ORDER — LACTATED RINGERS IV SOLN: INTRAVENOUS | Status: AC

## 2023-03-18 MED ORDER — METOPROLOL SUCCINATE ER 25 MG PO TB24
25.0000 mg | ORAL_TABLET | Freq: Every day | ORAL | Status: DC
  Administered 2023-03-18 – 2023-03-23 (×6): 25 mg via ORAL
  Filled 2023-03-18 (×6): qty 1

## 2023-03-18 MED ORDER — SODIUM CHLORIDE 0.9% FLUSH
3.0000 mL | Freq: Two times a day (BID) | INTRAVENOUS | Status: DC
  Administered 2023-03-18 – 2023-03-23 (×11): 3 mL via INTRAVENOUS

## 2023-03-18 MED ORDER — FOLIC ACID 1 MG PO TABS
1.0000 mg | ORAL_TABLET | Freq: Every day | ORAL | Status: DC
  Administered 2023-03-18 – 2023-03-23 (×6): 1 mg via ORAL
  Filled 2023-03-18 (×6): qty 1

## 2023-03-18 MED ORDER — PANTOPRAZOLE SODIUM 20 MG PO TBEC
20.0000 mg | DELAYED_RELEASE_TABLET | Freq: Two times a day (BID) | ORAL | Status: DC
  Administered 2023-03-18 – 2023-03-23 (×9): 20 mg via ORAL
  Filled 2023-03-18 (×11): qty 1

## 2023-03-18 MED ORDER — ASPIRIN 81 MG PO TBEC
81.0000 mg | DELAYED_RELEASE_TABLET | Freq: Every day | ORAL | Status: DC
  Administered 2023-03-18 – 2023-03-23 (×6): 81 mg via ORAL
  Filled 2023-03-18 (×6): qty 1

## 2023-03-18 MED ORDER — GUAIFENESIN ER 600 MG PO TB12
600.0000 mg | ORAL_TABLET | Freq: Two times a day (BID) | ORAL | Status: DC | PRN
  Administered 2023-03-20: 600 mg via ORAL
  Filled 2023-03-18: qty 1

## 2023-03-18 MED ORDER — HEPARIN SODIUM (PORCINE) 5000 UNIT/ML IJ SOLN
5000.0000 [IU] | Freq: Three times a day (TID) | INTRAMUSCULAR | Status: DC
  Administered 2023-03-18 – 2023-03-23 (×15): 5000 [IU] via SUBCUTANEOUS
  Filled 2023-03-18 (×15): qty 1

## 2023-03-18 MED ORDER — ONDANSETRON HCL 4 MG PO TABS: 4.0000 mg | ORAL_TABLET | Freq: Four times a day (QID) | ORAL | Status: DC | PRN

## 2023-03-18 MED ORDER — DONEPEZIL HCL 5 MG PO TABS
10.0000 mg | ORAL_TABLET | Freq: Every day | ORAL | Status: DC
  Administered 2023-03-18 – 2023-03-23 (×6): 10 mg via ORAL
  Filled 2023-03-18 (×6): qty 2

## 2023-03-18 MED ORDER — ACETAMINOPHEN 325 MG PO TABS: 650.0000 mg | ORAL_TABLET | Freq: Four times a day (QID) | ORAL | Status: DC | PRN

## 2023-03-18 MED ORDER — GABAPENTIN 300 MG PO CAPS
300.0000 mg | ORAL_CAPSULE | Freq: Two times a day (BID) | ORAL | Status: DC
  Administered 2023-03-18 – 2023-03-23 (×11): 300 mg via ORAL
  Filled 2023-03-18 (×11): qty 1

## 2023-03-18 MED ORDER — HYDRALAZINE HCL 20 MG/ML IJ SOLN: 5.0000 mg | INTRAMUSCULAR | Status: DC | PRN

## 2023-03-18 MED ORDER — ACETAMINOPHEN 650 MG RE SUPP: 650.0000 mg | Freq: Four times a day (QID) | RECTAL | Status: DC | PRN

## 2023-03-18 MED ORDER — MEMANTINE HCL 5 MG PO TABS
5.0000 mg | ORAL_TABLET | Freq: Every day | ORAL | Status: DC
  Administered 2023-03-18 – 2023-03-23 (×6): 5 mg via ORAL
  Filled 2023-03-18 (×6): qty 1

## 2023-03-18 MED ORDER — ONDANSETRON HCL 4 MG/2ML IJ SOLN: 4.0000 mg | Freq: Four times a day (QID) | INTRAMUSCULAR | Status: DC | PRN

## 2023-03-18 MED ORDER — ZOLPIDEM TARTRATE 5 MG PO TABS: 5.0000 mg | ORAL_TABLET | Freq: Every evening | ORAL | Status: DC | PRN

## 2023-03-18 MED ORDER — TRAZODONE HCL 50 MG PO TABS
50.0000 mg | ORAL_TABLET | Freq: Every evening | ORAL | Status: DC | PRN
  Administered 2023-03-20: 50 mg via ORAL
  Filled 2023-03-18 (×2): qty 1

## 2023-03-18 MED ORDER — NICOTINE 14 MG/24HR TD PT24
14.0000 mg | MEDICATED_PATCH | Freq: Every day | TRANSDERMAL | Status: DC
  Administered 2023-03-18 – 2023-03-23 (×7): 14 mg via TRANSDERMAL
  Filled 2023-03-18 (×7): qty 1

## 2023-03-18 MED ORDER — PREDNISONE 20 MG PO TABS
40.0000 mg | ORAL_TABLET | Freq: Every day | ORAL | Status: AC
  Administered 2023-03-19 – 2023-03-22 (×4): 40 mg via ORAL
  Filled 2023-03-18 (×4): qty 2

## 2023-03-18 MED ORDER — IPRATROPIUM-ALBUTEROL 0.5-2.5 (3) MG/3ML IN SOLN
3.0000 mL | Freq: Four times a day (QID) | RESPIRATORY_TRACT | Status: DC
  Administered 2023-03-18 (×2): 3 mL via RESPIRATORY_TRACT
  Filled 2023-03-18 (×2): qty 3

## 2023-03-18 MED ORDER — POTASSIUM CHLORIDE 10 MEQ/100ML IV SOLN
10.0000 meq | INTRAVENOUS | Status: AC
  Administered 2023-03-18 (×5): 10 meq via INTRAVENOUS
  Filled 2023-03-18 (×6): qty 100

## 2023-03-18 MED ORDER — FLUTICASONE FUROATE-VILANTEROL 100-25 MCG/ACT IN AEPB
1.0000 | INHALATION_SPRAY | Freq: Every day | RESPIRATORY_TRACT | Status: DC
  Administered 2023-03-18 – 2023-03-23 (×6): 1 via RESPIRATORY_TRACT
  Filled 2023-03-18: qty 28

## 2023-03-18 MED ORDER — METHYLPREDNISOLONE SODIUM SUCC 125 MG IJ SOLR
60.0000 mg | Freq: Two times a day (BID) | INTRAMUSCULAR | Status: AC
  Administered 2023-03-18 (×2): 60 mg via INTRAVENOUS
  Filled 2023-03-18 (×2): qty 2

## 2023-03-18 MED ORDER — HYDROCODONE-ACETAMINOPHEN 5-325 MG PO TABS
1.0000 | ORAL_TABLET | ORAL | Status: DC | PRN
  Administered 2023-03-19 – 2023-03-20 (×6): 1 via ORAL
  Administered 2023-03-21 – 2023-03-22 (×3): 2 via ORAL
  Administered 2023-03-22 (×3): 1 via ORAL
  Filled 2023-03-18: qty 1
  Filled 2023-03-18: qty 2
  Filled 2023-03-18: qty 1
  Filled 2023-03-18: qty 2
  Filled 2023-03-18 (×5): qty 1
  Filled 2023-03-18: qty 2
  Filled 2023-03-18 (×2): qty 1
  Filled 2023-03-18: qty 2

## 2023-03-18 NOTE — Assessment & Plan Note (Signed)
Patient is a lifetime heavy smoker, CT imaging concerning for widespread metastatic disease likely lung primary.  No prior diagnosis.  Concern of bony, lymph node and liver mets at this time.  Patient with poor underlying functional status.  Discussed with POA, patient is unable to comprehend the gravity of her illness, family is leaning towards comfort care and does not want further imaging, biopsies or treatment as she might not be a candidate for chemotherapy and has pretty advanced disease. -Palliative care consult -Likely be back to her facility/SNF with hospice -Pain management

## 2023-03-18 NOTE — Progress Notes (Signed)
Progress Note   Patient: Mariah [name] HYQ:[id number] DOB: 1945-10-21 DOA: 03/17/2023     0 DOS: the patient was seen and examined on 03/18/2023   Brief hospital course: Taken from H&P   JASMAIN CAWLEY is a 77 y.o. female with medical history significant for Abdominal aortic aneurysm (AAA) Arthritis, Chronic diastolic heart failure (HCC), COPD , GERD , Headache, History of adenomatous polyp of colon, Hyperlipidemia, Hypertension, Legionella infection (HCC) (07/17/2020), Psoriasis, Right lumbar radiculitis, and Vitamin D deficiency brought from nursing facility for shortness of breath. No CP.  On arrival she was tachycardic, EKG shows sinus tach at 116 PR of 103 QTc prolonged at 514 history of left bundle branch block no acute ST changes. Workup showed elevated troponin of 217 rising to 248, elevated BNP at 477, potasium 2.3,creatinine 1.72, Patient had a CT chest PE protocol today and unfortunately found a large left hilar and mediastinal mass that is concerning for malignancy with metastatic lymphadenopathy with mets to the liver, small left pleural effusion, aortic aneurysm of 4 cm.  CT head with no acute intracranial abnormality but did show new multiple lytic skull lesions most significant in the right parietal bone, suspicious for metastatic disease. Patient's prognosis is poor if this is metastatic especially due to her frail health.   8/29: Vital stable.  Troponin peaked at 248, now trending down, potassium of 2.7, creatinine 1.68, alkaline phosphatase elevated at 220 which seems chronic, T. bili 1.9 and anion gap remained at 17 with no acidosis.  Worsening thrombocytopenia at 67, BNP 477. Patient having some hip pain likely metastatic disease.  Poor underlying functional status. Alert and oriented x 2 but unable to comprehend the severity of this new development. Lengthy discussion with POA at bedside and they are leaning towards comfort care and does not want further testing as she might not  be able to tolerate any treatment for this advanced malignancy.  Palliative care was also consulted.  We will hold off to oncology consult at this time.      Assessment and Plan: * Metastatic neoplastic disease (HCC) Patient is a lifetime heavy smoker, CT imaging concerning for widespread metastatic disease likely lung primary.  No prior diagnosis.  Concern of bony, lymph node and liver mets at this time.  Patient with poor underlying functional status.  Discussed with POA, patient is unable to comprehend the gravity of her illness, family is leaning towards comfort care and does not want further imaging, biopsies or treatment as she might not be a candidate for chemotherapy and has pretty advanced disease. -Palliative care consult -Likely be back to her facility/SNF with hospice -Pain management  SOB (shortness of breath) Acute hypoxic respiratory failure.  No baseline oxygen use 2/2 large left lung and mediastinal mass concerning for malignancy with pleural effusion.  Cont with supplemental oxygen as needed.  -Supportive care -Likely be going back with hospice help as family does not want further investigation or treatment   COPD (chronic obstructive pulmonary disease) (HCC) Currently stable.  No wheezing, new oxygen requirement likely secondary to new diagnosis of large lung mass and pleural effusion. -Can use prednisone for next few days to see if that will help. -Continue with bronchodilators  Essential (primary) hypertension Blood pressure within goal. -Continue home metoprolol and keep holding diuretic.   CKD (chronic kidney disease), stage III (HCC) Seems like slowly worsening renal function over the past few month. Did receive contrast with CTA yesterday.  Creatinine at 1.68. -Monitor renal function if patient  is not transitioned to full comfort care, pending palliative goals of care discussion  Congestive heart failure [facility]) Patient with history of HFpEF, prior echo  done in 2022 with normal EF and grade 1 diastolic dysfunction.  Clinically appears euvolemic.  BNP elevated at 477 but patient also has CKD and large hilar mass with pleural effusion. -On as needed Lasix at home -Monitor volume status  Gastro-esophageal reflux disease without esophagitis -Continue PPI  Tobacco abuse Nicotine patch.    Rash and nonspecific skin eruption History of psoriasis. -Continue to monitor   Subjective: Patient was complaining of hip bone, pointing towards ischium.  Lifetime smoker.  Oriented x 2 but seen does not seem like comprehending the concern of recent diagnosis.  Physical Exam: Vitals:   03/18/23 0757 03/18/23 0803 03/18/23 0833 03/18/23 1057  BP: 125/72   127/66  Pulse: 97   79  Resp: 18 20 18 20   Temp: (!) 97.5 F (36.4 C)   98.7 F (37.1 C)  TempSrc: Oral   Oral  SpO2: 94%   91%  Weight:      Height:       General.  Frail elderly lady, in no acute distress. Pulmonary.  Harsh breath sounds bilaterally, normal respiratory effort. CV.  Regular rate and rhythm, no JVD, rub or murmur. Abdomen.  Soft, nontender, nondistended, BS positive. CNS.  Alert and oriented x 2.  No focal neurologic deficit. Extremities.  Trace LE edema, no cyanosis, pulses intact and symmetrical. Psychiatry.  Seems to have cognitive impairment  Data Reviewed: Prior data reviewed  Family Communication: Discussed with niece and brother-in-law who is also her POA at bedside.  Disposition: Status is: Observation The patient will require care spanning > 2 midnights and should be moved to inpatient because: Severity of illness  Planned Discharge Destination:  To be determined, facility with hospice versus inpatient hospice  DVT prophylaxis.  Subcu heparin Time spent: 50 minutes  This record has been created using Conservation officer, historic buildings. Errors have been sought and corrected,but may not always be located. Such creation errors do not reflect on the standard of  care.   Author: Arnetha Courser, MD 03/18/2023 1:51 PM  For on call review [email or web address].

## 2023-03-18 NOTE — TOC Initial Note (Signed)
Transition of Care [facility]) - Initial/Assessment Note    Patient Details  Name: Mariah [name] MRN: [id number] Date of Birth: 1946/06/29  [facility]) CM/SW Contact:    Margarito Liner, LCSW Phone Number: 03/18/2023, 10:30 AM  Clinical Narrative:  Per chart review, patient is from [facility]. CSW called liaison, Tammy, and confirmed. Patient was not using DME or oxygen prior to admission. She just submitted an order yesterday for home health nursing through [facility]. [facility] liaison confirmed. Palliative consulted today. Will follow for recommendations.               Expected Discharge Plan: Assisted Living (with home health) Barriers to Discharge: Continued Medical Work up   Patient Goals and CMS Choice            Expected Discharge Plan and Services     Post Acute Care Choice:  (TBD) Living arrangements for the past 2 months: Assisted Living Facility                           HH Arranged: RN [facility] Agency: [facility] Date HH Agency Contacted: 03/18/23   Representative spoke with at [facility] Agency: Feliberto Gottron  Prior Living Arrangements/Services Living arrangements for the past 2 months: Assisted Living Facility Lives with:: Facility Resident Patient language and need for interpreter reviewed:: Yes        Need for Family Participation in Patient Care: Yes (Comment) Care giver support system in place?: Yes (comment)   Criminal Activity/Legal Involvement Pertinent to Current Situation/Hospitalization: No - Comment as needed  Activities of Daily Living      Permission Sought/Granted                  Emotional Assessment       Orientation: : Oriented to Self, Oriented to Place Alcohol / Substance Use: Not Applicable Psych Involvement: No (comment)  Admission diagnosis:  Hypokalemia [E87.6] Lung mass [R91.8] SOB (shortness of breath) [R06.02] Elevated troponin [R79.89] Patient Active Problem List   Diagnosis Date Noted    Rash and nonspecific skin eruption 03/18/2023   Dysuria 03/01/2023   Delirium 03/01/2023   Olecranon bursitis of left elbow 12/25/2021   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   Leukopenia 02/13/2021   Dental caries 02/13/2021   Transaminitis 01/11/2021   COPD (chronic obstructive pulmonary disease) (HCC)    Tobacco abuse    Diarrhea 01/10/2021   Thrombocytopenia (HCC) 09/19/2020   Orthostatic hypotension 09/18/2020   Syncope 09/17/2020   History of DVT (deep vein thrombosis) 07/09/2020   Multifocal atrial tachycardia 07/09/2020   Congestive heart failure (HCC) 06/18/2020   Weakness    Protein-calorie malnutrition, severe 06/05/2020   Cutaneous horn 06/03/2020   Pleural effusion 06/03/2020   Irregular heartbeat 05/30/2020   Bilateral edema of lower extremity 05/30/2020   Blood transfusion reaction 05/30/2020   CKD (chronic kidney disease), stage III (HCC) 05/26/2020   SOB (shortness of breath) 05/26/2020   Acute deep vein thrombosis (DVT) of tibial vein of left lower extremity (HCC) 04/05/2020   Mental confusion 04/01/2020   Alzheimer's dementia (HCC) 03/01/2020   Allergic rhinitis 01/10/2018   Cholelithiasis 03/16/2016   Peripheral arterial disease (HCC) 02/05/2016   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   Abdominal aortic aneurysm (AAA) (HCC) 01/13/2016   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   Nerve root inflammation 10/25/2015   Psoriatic arthritis (HCC) 10/07/2015   Degenerative arthritis of hip 10/07/2015   Right hip pain  08/12/2015   Arthralgia of multiple joints 08/12/2015   Age related osteoporosis 08/12/2015   Senile purpura (HCC) 06/24/2015   Abnormal serum level of alkaline phosphatase 06/18/2015   Lethargy 06/18/2015   Gastro-esophageal reflux disease without esophagitis 06/18/2015   Restless leg 06/18/2015   Avitaminosis D 06/18/2015   Mechanical and motor problems with internal organs 10/11/2009   Hyperlipidemia 10/11/2009   Arthritis, degenerative 09/25/2009    Essential (primary) hypertension 09/25/2009   Psoriasis 09/25/2009   Compulsive tobacco user syndrome 09/25/2009   PCP:  Erasmo Downer, MD Pharmacy:   [facility] [phone] [place], [place] - [address] [place] [address] Phone: [phone] Fax: [phone]  [facility] - [place], [place] - [address] [place] [address] Phone: [phone] Fax: [phone]     Social Determinants of Health (SDOH) Social History: SDOH Screenings   Food Insecurity: No Food Insecurity (10/31/2020)  Housing: Low Risk  (10/31/2020)  Transportation Needs: No Transportation Needs (10/31/2020)  Alcohol Screen: Low Risk  (03/01/2023)  Depression (PHQ2-9): High Risk (03/01/2023)  Financial Resource Strain: Medium Risk (07/10/2019)  Physical Activity: Inactive (07/10/2019)  Social Connections: Moderately Integrated (07/10/2019)  Stress: No Stress Concern Present (10/31/2020)  Tobacco Use: High Risk (03/09/2023)   Received from [facility]   SDOH Interventions:     Readmission Risk Interventions     No data to display

## 2023-03-18 NOTE — Progress Notes (Signed)
[facility] Liaison Note:    This patient is currently enrolled in the [facility] outpatient-based palliative care program.  Marcell Anger will continue to follow for discharge disposition.    Please call for any palliative care questions or concerns.  [facility] Liaison [phone]

## 2023-03-18 NOTE — Progress Notes (Signed)
Initial Nutrition Assessment  DOCUMENTATION CODES:   Non-severe (moderate) malnutrition in context of chronic illness  INTERVENTION:   Ensure Enlive po BID, each supplement provides 350 kcal and 20 grams of protein.  Magic cup TID with meals, each supplement provides 290 kcal and 9 grams of protein  MVI po daily   Liberalize diet   Pt at high refeed risk; recommend monitor potassium, magnesium and phosphorus labs daily until stable  Daily weights   NUTRITION DIAGNOSIS:   Moderate Malnutrition related to chronic illness as evidenced by moderate fat depletion, moderate muscle depletion.  GOAL:   Patient will meet greater than or equal to 90% of their needs  MONITOR:   Supplement acceptance, PO intake, Labs, Weight trends, I & O's, Skin  REASON FOR ASSESSMENT:   Consult Assessment of nutrition requirement/status  ASSESSMENT:   77 y/o female with h/o HTN, GERD, CKD III, CHF, COPD, HLD, AAA, dementia and DVT who was admitted with SOB and was found to have a large left lung and mediastinal mass concerning for malignancy with pleural effusion.  Met with pt in room today. Pt reports good appetite and oral intake at baseline. Pt eating lunch at time of RD visit. Pt had completed almost 100% of her sandwich today. RD discussed with pt the importance of adequate nutrition needed to preserve lean muscle. Pt reports that she is willing to drink chocolate Ensure in hospital. RD will add supplements and MVI to help pt meet her estimated needs. Pt reports that she loves ice cream; RD will add Magic Cups to meals trays. Pt is likely at refeed risk. Per chart, pt is down 8lbs(6%) since December; RD unsure how recently weight loss occurred. Palliative care is following.   Medications reviewed and include: aspirin, folic acid, heparin, solu-medrol, MVI, nicotine, protonix  Labs reviewed: K 2.7(L), BUN 58(H), creat 1.68(H)  NUTRITION - FOCUSED PHYSICAL EXAM:  Flowsheet Row Most Recent  Value  Orbital Region Mild depletion  Upper Arm Region No depletion  Thoracic and Lumbar Region Moderate depletion  Buccal Region No depletion  Temple Region No depletion  Clavicle Bone Region Mild depletion  Clavicle and Acromion Bone Region Mild depletion  Scapular Bone Region No depletion  Dorsal Hand Moderate depletion  Patellar Region Moderate depletion  Anterior Thigh Region Moderate depletion  Posterior Calf Region Moderate depletion  Edema (RD Assessment) None  Hair Reviewed  Eyes Reviewed  Mouth Reviewed  Skin Reviewed  Nails Reviewed   Diet Order:   Diet Order             Diet Heart Room service appropriate? Yes; Fluid consistency: Thin  Diet effective now                  EDUCATION NEEDS:   Education needs have been addressed  Skin:  Skin Assessment: Reviewed RN Assessment (ecchymosis)  Last BM:  pta  Height:   Ht Readings from Last 1 Encounters:  03/18/23 5\' 1"  (1.549 m)    Weight:   Wt Readings from Last 1 Encounters:  03/18/23 58.4 kg    Ideal Body Weight:  47.7 kg  BMI:  Body mass index is 24.33 kg/m.  Estimated Nutritional Needs:   Kcal:  1400-1600kcal/day  Protein:  70-80g/day  Fluid:  1.2-1.4L/day  Betsey Holiday MS, RD, LDN Please refer to [facility] for RD and/or RD on-call/weekend/after hours pager

## 2023-03-18 NOTE — Assessment & Plan Note (Addendum)
Blood pressure within goal. -Continue home metoprolol and keep holding diuretic.

## 2023-03-18 NOTE — Progress Notes (Signed)
PHARMACY CONSULT NOTE - FOLLOW UP  Pharmacy Consult for Electrolyte Monitoring and Replacement   Recent Labs: Potassium (mmol/L)  Date Value  03/18/2023 2.7 (LL)   Magnesium (mg/dL)  Date Value  96/29/5284 2.6 (H)   Calcium  Date Value  03/18/2023 8.9 mg/dL  13/24/4010 9.7   Albumin (g/dL)  Date Value  27/25/3664 3.4 (L)  08/05/2022 4.4   Phosphorus (mg/dL)  Date Value  40/34/7425 3.1   Sodium (mmol/L)  Date Value  03/18/2023 138  08/25/2022 141     Assessment: 8/29 :   K @ 0252 = 2.7  Goal of Therapy:  Electrolytes WNL   Plan:  8/29: K @ 0252 = 2.7 Will order KCl 10 mEq IV X 6.  Will recheck electrolytes on 8/30 with AM labs.  Scherrie Gerlach ,PharmD Clinical Pharmacist 03/18/2023 4:19 AM

## 2023-03-18 NOTE — Assessment & Plan Note (Addendum)
History of psoriasis. -Continue to monitor

## 2023-03-18 NOTE — Hospital Course (Addendum)
Taken from H&P   Mariah [name] is a 77 y.o. female with medical history significant for Abdominal aortic aneurysm (AAA) Arthritis, Chronic diastolic heart failure (HCC), COPD , GERD , Headache, History of adenomatous polyp of colon, Hyperlipidemia, Hypertension, Legionella infection (HCC) (07/17/2020), Psoriasis, Right lumbar radiculitis, and Vitamin D deficiency brought from nursing facility for shortness of breath. No CP.  On arrival she was tachycardic, EKG shows sinus tach at 116 PR of 103 QTc prolonged at 514 history of left bundle branch block no acute ST changes. Workup showed elevated troponin of 217 rising to 248, elevated BNP at 477, potasium 2.3,creatinine 1.72, Patient had a CT chest PE protocol today and unfortunately found a large left hilar and mediastinal mass that is concerning for malignancy with metastatic lymphadenopathy with mets to the liver, small left pleural effusion, aortic aneurysm of 4 cm.  CT head with no acute intracranial abnormality but did show new multiple lytic skull lesions most significant in the right parietal bone, suspicious for metastatic disease. Patient's prognosis is poor if this is metastatic especially due to her frail health.   8/29: Vital stable.  Troponin peaked at 248, now trending down, potassium of 2.7, creatinine 1.68, alkaline phosphatase elevated at 220 which seems chronic, T. bili 1.9 and anion gap remained at 17 with no acidosis.  Worsening thrombocytopenia at 67, BNP 477. Patient having some hip pain likely metastatic disease.  Poor underlying functional status. Alert and oriented x 2 but unable to comprehend the severity of this new development. Lengthy discussion with POA at bedside and they are leaning towards comfort care and does not want further testing as she might not be able to tolerate any treatment for this advanced malignancy.  Palliative care was also consulted.  We will hold off to oncology consult at this time.

## 2023-03-18 NOTE — Assessment & Plan Note (Addendum)
Currently stable.  No wheezing, new oxygen requirement likely secondary to new diagnosis of large lung mass and pleural effusion. -Can use prednisone for next few days to see if that will help. -Continue with bronchodilators

## 2023-03-18 NOTE — Assessment & Plan Note (Addendum)
Continue PPI ?

## 2023-03-18 NOTE — Assessment & Plan Note (Addendum)
Patient with history of HFpEF, prior echo done in 2022 with normal EF and grade 1 diastolic dysfunction.  Clinically appears euvolemic.  BNP elevated at 477 but patient also has CKD and large hilar mass with pleural effusion. -On as needed Lasix at home -Monitor volume status

## 2023-03-18 NOTE — Assessment & Plan Note (Signed)
-  Nicotine patch 

## 2023-03-18 NOTE — Assessment & Plan Note (Addendum)
Seems like slowly worsening renal function over the past few month. Did receive contrast with CTA yesterday.  Creatinine at 1.68. -Monitor renal function if patient is not transitioned to full comfort care, pending palliative goals of care discussion

## 2023-03-18 NOTE — Consult Note (Addendum)
Consultation Note Date: 03/18/2023 at 1130  Patient Name: Mariah [name]  DOB: Mar 02, 1946  MRN: [id number]  Age / Sex: 77 y.o., female  PCP: Mariah Downer, MD Referring Physician: Arnetha Courser, MD  Reason for Consultation: Establishing goals of care  HPI/Patient Profile: 77 y.o. female  with past medical history of dementia with worsening short-term memory, debility secondary to Alzheimer's dementia, CHF, atrial tachycardia, PAD, history of DVT, AAA, chronic diastolic HF, history of at anonymous polyp of colon, HLD, Legionella infection (07/17/2020) right lumbar radiculitis, vitamin D deficiency, COPD, GERD, psoriatic arthritis, CKD, protein calorie malnutrition, and RLS admitted on 03/17/2023 with shortness of breath.  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  Patient's prognosis is terminal if this is metastatic especially due to her frail health.  PMT was consulted in light of patient's poor prognosis to review goals of care with patient and family.   Clinical Assessment and Goals of Care: I have reviewed medical records including EPIC notes, labs and imaging, and assessed the patient. She is alert and oriented to self but unable to participate in complex medical decision making or GOC discussions independentaly at this time.   After assessing the patient, I then spoke with patient's POA Mariah [name] over the phone to discuss diagnosis prognosis, GOC, EOL wishes, disposition and options.  I introduced Palliative Medicine as specialized medical care for people living with serious illness. It focuses on providing relief from the symptoms and stress of a serious illness. The goal is to improve quality of life for both the patient and the family.  We discussed a brief  life review of the patient.  Patient is divorced and never had children.  She enjoyed playing the piano and organ at her church.  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  Her brother-in-law (her sister has passed) is her HCPOA.  He endorses he is also her financial POA.  He shares he had to intervene and take over because she began to not pay bills and run up a large debt.  As far as functional and nutritional status he endorses a significant decline over the past several months.  Patient was walking with a cane, then required a walker, and then was not getting out of bed over the past several weeks.  She once participated in walking to the dining area of [facility] and most recently they were taking her snacks and sandwiches as meals to her room.  As far as cognitive status, he endorses a significant decline as well.  She has had issues with short-term memory but has most recently been overall more confused.  We discussed patient's current illness and what it means in the larger context of patient's on-going co-morbidities.  Discussed concern for widespread metastatic disease which carries a overall poor prognosis.  I highlighted this is superimposed on her already underlying dementia and chronic  tobacco use.  Discussed patient's overall functional, nutritional, and cognitive status and their recent decline as important indicators of patient's overall prognosis.  I attempted to elicit values and goals of care important to the patient.  Mariah [name] would like to ensure the patient's pain is minimized.  Her shares that smoking remains important to [facility].   He inquires about her prognosis and discahrge. I shared that given her overal poor functional status, decline in nutritional status, and even great decline in cognitive status, I believe patient has less than 6 months of life and would be appropriate for enacting hospice benefits.   However, I shared that before we  discuss discharge, goals need to be clarified. The difference between aggressive medical intervention and comfort care was reviewed.  Raybon shares he is not ready to shift to full comfort at this time.  He would like to see what her lab work says tomorrow after potassium replacement.  I conveyed that while we may be able to correct her electrolytes and make her "numbers (aka BP, HR, etc) become normal", it is not addressing the underlying chronic, progressive, and irreversible dementia or the more concerning metastatic neoplasm suspected that has already spread to her liver, lymph nodes, and bone.  Advance directives, concepts specific to code status, artificial feeding and hydration, and rehospitalization were considered and discussed.  Patient has HCPOA paperwork on file reflecting that Mariah [name] is her [facility] decision maker.  He is aware as he is also her financial POA.   We discussed the difference between full code and DNR status.  In the event of a cardiopulmonary arrest, Mariah [name] would for Mariah [name] to have natural, peaceful passing.  He does not want her to receive CPR/ACLS protocol or to ever be placed on a ventilator. Code status changed to DNR with limited interventions. MD and RN made aware via secure chat.   After our lengthy discussion, Mariah [name] would like to see how the patient is doing tomorrow.  No change to treatment plan at this time.   We discussed potential for hospice inpatient evaluation should he decide to shift to comfort measures tomorrow.  He shares he has had a positive experience with [facility] outpatient palliative and would like for [facility] to be the providers that follow her, either at the inpatient unit or at a SNF/LTC. I shared [facility] is following closely for disposition planning.    Discussed with Mariah [name] the importance of continued conversation with the medical providers regarding overall plan of care and treatment options, ensuring decisions are within the context of the  patient's values and GOCs.    Questions and concerns were addressed.  Mariah [name] made aware that I am off service after today.  However, my colleague from PMT will continue to follow and support both patient and family throughout her hospitalization.  DNR with limited interventions.  Mariah [name] would like to see how patient fares over the next 24 hours and make a decision in regards to comfort measures/IPU/hospice evaluation tomorrow, 8/30.  Primary Decision Maker HCPOA  Physical Exam Vitals reviewed.  Constitutional:      General: She is not in acute distress.    Appearance: She is normal weight. She is ill-appearing.  HENT:     Head: Normocephalic.  Cardiovascular:     Rate and Rhythm: Normal rate.  Skin:    General: Skin is warm.     Coloration: Skin is pale.  Neurological:     Mental Status: She is alert.     Comments: Oriented to  self and place     Palliative Assessment/Data: 30%     Thank you for this consult. Palliative medicine will continue to follow and assist holistically.   Time Total: 75 minutes  Signed by: Georgiann Cocker, DNP, FNP-BC Palliative Medicine    Please contact Palliative Medicine Team phone at [phone] for questions and concerns.  For individual provider: See Loretha Stapler

## 2023-03-18 NOTE — Assessment & Plan Note (Addendum)
Acute hypoxic respiratory failure.  No baseline oxygen use 2/2 large left lung and mediastinal mass concerning for malignancy with pleural effusion.  Cont with supplemental oxygen as needed.  -Supportive care -Likely be going back with hospice help as family does not want further investigation or treatment

## 2023-03-19 DIAGNOSIS — C799 Secondary malignant neoplasm of unspecified site: Secondary | ICD-10-CM | POA: Diagnosis not present

## 2023-03-19 DIAGNOSIS — J449 Chronic obstructive pulmonary disease, unspecified: Secondary | ICD-10-CM | POA: Diagnosis not present

## 2023-03-19 DIAGNOSIS — Z515 Encounter for palliative care: Secondary | ICD-10-CM | POA: Diagnosis not present

## 2023-03-19 LAB — BASIC METABOLIC PANEL
Anion gap: 18 — ABNORMAL HIGH (ref 5–15)
BUN: 48 mg/dL — ABNORMAL HIGH (ref 8–23)
CO2: 33 mmol/L — ABNORMAL HIGH (ref 22–32)
Calcium: 9.6 mg/dL (ref 8.9–10.3)
Chloride: 93 mmol/L — ABNORMAL LOW (ref 98–111)
Creatinine, Ser: 1.18 mg/dL — ABNORMAL HIGH (ref 0.44–1.00)
GFR, Estimated: 47 mL/min — ABNORMAL LOW (ref >60)
Glucose, Bld: 163 mg/dL — ABNORMAL HIGH (ref 70–99)
Potassium: 3.2 mmol/L — ABNORMAL LOW (ref 3.5–5.1)
Sodium: 144 mmol/L (ref 135–145)

## 2023-03-19 LAB — URINALYSIS, W/ REFLEX TO CULTURE (INFECTION SUSPECTED)
Bilirubin Urine: NEGATIVE
Glucose, UA: NEGATIVE mg/dL
Ketones, ur: NEGATIVE mg/dL
Nitrite: NEGATIVE
Protein, ur: 30 mg/dL — AB
Specific Gravity, Urine: 1.031 — ABNORMAL HIGH (ref 1.005–1.030)
pH: 5 (ref 5.0–8.0)

## 2023-03-19 LAB — MAGNESIUM: Magnesium: 2.6 mg/dL — ABNORMAL HIGH (ref 1.7–2.4)

## 2023-03-19 LAB — PHOSPHORUS: Phosphorus: 2.5 mg/dL (ref 2.5–4.6)

## 2023-03-19 MED ORDER — POTASSIUM CHLORIDE CRYS ER 20 MEQ PO TBCR
40.0000 meq | EXTENDED_RELEASE_TABLET | Freq: Once | ORAL | Status: AC
  Administered 2023-03-19: 40 meq via ORAL
  Filled 2023-03-19: qty 2

## 2023-03-19 NOTE — Evaluation (Signed)
Physical Therapy Evaluation Patient Details Name: Mariah [name] MRN: [id number] DOB: 1946-04-17 Today's Date: 03/19/2023  History of Present Illness  77 y/o female presented to ED on 03/17/23 for SOB. CT chest showed large L hilar and mediastinal mass that is concerning for malignancy with metastatic lymphadenopathy with mets to the liver. PMH: DVT, COPD, AAA, GERD, HLD, HTN, dementia, CHF  Clinical Impression  Patient admitted with the above. PTA, patient lives at [facility] and was typically independent until the past week and a half where she has required assistance for mobility with RW and ADLs. Patient presents with weakness, impaired balance, and decreased activity tolerance. Required CGA to come to sitting EOB and minA to stand at bedside and take small steps towards HOB. ModA to return to supine for LE management. On 2-3L O2 throughout, spO2 >90%. Patient will benefit from skilled PT services during acute stay to address listed deficits. Discharge recommendation is contingent on family's decision on goals of care. Planning to meet with Palliative team this date to discuss.     If plan is discharge home, recommend the following: A little help with walking and/or transfers;A little help with bathing/dressing/bathroom;Assistance with cooking/housework;Direct supervision/assist for medications management;Direct supervision/assist for financial management;Assist for transportation;Help with stairs or ramp for entrance;Supervision due to cognitive status   Can travel by private vehicle   No    Equipment Recommendations None recommended by PT  Recommendations for Other Services       Functional Status Assessment Patient has had a recent decline in their functional status and/or demonstrates limited ability to make significant improvements in function in a reasonable and predictable amount of time     Precautions / Restrictions Precautions Precautions: Fall Restrictions Weight Bearing  Restrictions: No      Mobility  Bed Mobility Overal bed mobility: Needs Assistance Bed Mobility: Supine to Sit, Sit to Supine     Supine to sit: Contact guard Sit to supine: Mod assist        Transfers Overall transfer level: Needs assistance Equipment used: Rolling Shontae Rosiles (2 wheels) Transfers: Sit to/from Stand Sit to Stand: Min assist           General transfer comment: assist to boost into standing. Able to take sidesteps towards [facility]    Ambulation/Gait                  Stairs            Wheelchair Mobility     Tilt Bed    Modified Rankin (Stroke Patients Only)       Balance Overall balance assessment: Needs assistance Sitting-balance support: Feet supported, No upper extremity supported Sitting balance-Leahy Scale: Fair     Standing balance support: Bilateral upper extremity supported, Reliant on assistive device for balance Standing balance-Leahy Scale: Fair                               Pertinent Vitals/Pain Pain Assessment Pain Assessment: 0-10 Pain Score: 9  Pain Location: back Pain Descriptors / Indicators: Grimacing, Guarding Pain Intervention(s): Monitored during session, Repositioned, Limited activity within patient's tolerance    Home Living Family/patient expects to be discharged to:: Assisted living                        Prior Function Prior Level of Function : Needs assist;Independent/Modified Independent  Mobility Comments: typically independent, however over the past week and a half, patient requiring more assistance for mobility with Rw and ADLs       Extremity/Trunk Assessment   Upper Extremity Assessment Upper Extremity Assessment: Defer to OT evaluation    Lower Extremity Assessment Lower Extremity Assessment: Generalized weakness    Cervical / Trunk Assessment Cervical / Trunk Assessment: Kyphotic  Communication   Communication Communication: No apparent  difficulties  Cognition Arousal: Alert Behavior During Therapy: WFL for tasks assessed/performed Overall Cognitive Status: History of cognitive impairments - at baseline                                          General Comments General comments (skin integrity, edema, etc.): on 2-3L O2 during session, spO2 >90% throughout    Exercises     Assessment/Plan    PT Assessment Patient needs continued PT services  PT Problem List Decreased strength;Decreased activity tolerance;Decreased balance;Decreased mobility;Decreased knowledge of use of DME;Decreased cognition;Decreased safety awareness;Decreased knowledge of precautions;Cardiopulmonary status limiting activity       PT Treatment Interventions Gait training;DME instruction;Therapeutic activities;Functional mobility training;Therapeutic exercise;Balance training;Patient/family education    PT Goals (Current goals can be found in the Care Plan section)  Acute Rehab PT Goals Patient Stated Goal: did not state PT Goal Formulation: With patient/family Time For Goal Achievement: 04/02/23 Potential to Achieve Goals: Fair    Frequency Min 1X/week     Co-evaluation               AM-PAC PT "6 Clicks" Mobility  Outcome Measure Help needed turning from your back to your side while in a flat bed without using bedrails?: A Little Help needed moving from lying on your back to sitting on the side of a flat bed without using bedrails?: A Little Help needed moving to and from a bed to a chair (including a wheelchair)?: A Little Help needed standing up from a chair using your arms (e.g., wheelchair or bedside chair)?: A Little Help needed to walk in hospital room?: A Little Help needed climbing 3-5 steps with a railing? : A Lot 6 Click Score: 17    End of Session Equipment Utilized During Treatment: Gait belt;Oxygen Activity Tolerance: Patient tolerated treatment well Patient left: in bed;with call bell/phone within  reach;with bed alarm set;with family/visitor present Nurse Communication: Mobility status PT Visit Diagnosis: Unsteadiness on feet (R26.81);Muscle weakness (generalized) (M62.81)    Time: 6045-4098 PT Time Calculation (min) (ACUTE ONLY): 35 min   Charges:   PT Evaluation $PT Eval Moderate Complexity: 1 Mod   PT General Charges $$ ACUTE PT VISIT: 1 Visit         Maylon Peppers, PT, DPT Physical Therapist - [facility]  [facility]   Ruthmary Occhipinti A Yianni Skilling 03/19/2023, 11:24 AM

## 2023-03-19 NOTE — Progress Notes (Signed)
PHARMACY CONSULT NOTE - ELECTROLYTES  Pharmacy Consult for Electrolyte Monitoring and Replacement   Recent Labs: Height: 5\' 1"  (154.9 cm) Weight: 58.8 kg (129 lb 10.1 oz) IBW/kg (Calculated) : 47.8 Estimated Creatinine Clearance: 32.4 mL/min (A) (by C-G formula based on SCr of 1.18 mg/dL (H)). Potassium (mmol/L)  Date Value  03/19/2023 3.2 (L)   Magnesium (mg/dL)  Date Value  41/32/4401 2.6 (H)   Calcium  Date Value  03/19/2023 9.6 mg/dL  02/72/5366 9.7   Albumin (g/dL)  Date Value  44/09/4740 3.4 (L)  08/05/2022 4.4   Phosphorus (mg/dL)  Date Value  59/56/3875 2.5   Sodium (mmol/L)  Date Value  03/19/2023 144  08/25/2022 141   Corrected Ca: 10.1 mg/dL  Assessment  Mariah [name] is a 77 y.o. female presenting with SOB. PMH significant for abdominal aortic aneurysm, arthritis, chronic diastolic heart failure, COPD, GERD, hyperlipidemia, hypertension. Pharmacy has been consulted to monitor and replace electrolytes.  Diet: Regular MIVF: N/A Pertinent medications: N/A  Goal of Therapy: Electrolytes WNL  Plan:  Will give KCl 40 mEq PO x 1 dose Check BMP with AM labs  Thank you for allowing pharmacy to be a part of this patient's care.  Merryl Hacker, PharmD Clinical Pharmacist 03/19/2023 7:20 AM

## 2023-03-19 NOTE — Evaluation (Signed)
Occupational Therapy Evaluation Patient Details Name: Mariah [name] MRN: [id number] DOB: [birth year] Today's Date: 03/19/2023   History of Present Illness 77 y/o female presented to ED on 03/17/23 for SOB. CT chest showed large L hilar and mediastinal mass that is concerning for malignancy with metastatic lymphadenopathy with mets to the liver. PMH: DVT, COPD, AAA, GERD, HLD, HTN, dementia, CHF   Clinical Impression   Pt in bed upon OT arrival, niece present, and both agreeable for participation in OT eval/co tx with PT.  Assisted with partial panty change while standing at bedside, with pt requiring 2 hand support on walker to stand, OT assisted to lower soiled panties.  Transitioned back to supine for dependent peri care, replacement of purewick, and panty hike.  Pt able to participate in rolling to the L and bridging for OT to hike panties.  LB ADLs currently max-total assist d/t pain/weakness.  Niece reports family will likely decide to d/c pt with palliative or hospice care upon d/c.  Is this does not occur, pt will benefit from SNF to maximize functional strength for ADLs.        If plan is discharge home, recommend the following: A lot of help with bathing/dressing/bathroom;A little help with walking and/or transfers;Direct supervision/assist for medications management;Assist for transportation;Supervision due to cognitive status;Direct supervision/assist for financial management;Help with stairs or ramp for entrance    Functional Status Assessment  Patient has had a recent decline in their functional status and demonstrates the ability to make significant improvements in function in a reasonable and predictable amount of time.  Equipment Recommendations   (defer to next venue of care)    Recommendations for Other Services       Precautions / Restrictions Precautions Precautions: Fall Restrictions Weight Bearing Restrictions: No      Mobility Bed Mobility Overal bed mobility:  Needs Assistance Bed Mobility: Supine to Sit, Sit to Supine     Supine to sit: Contact guard Sit to supine: Mod assist     Patient Response: Cooperative  Transfers Overall transfer level: Needs assistance Equipment used: Rolling walker (2 wheels) Transfers: Sit to/from Stand Sit to Stand: Min assist           General transfer comment: tactile and vc for safe hand placement during sit to stand      Balance Overall balance assessment: Needs assistance Sitting-balance support: Feet supported, No upper extremity supported Sitting balance-Leahy Scale: Fair     Standing balance support: Bilateral upper extremity supported, Reliant on assistive device for balance Standing balance-Leahy Scale: Fair Standing balance comment: Unable to remove hands from walker to assist with lowering panties while standing                           ADL either performed or assessed with clinical judgement   ADL Overall ADL's : Needs assistance/impaired Eating/Feeding: Set up   Grooming: Set up;Minimal assistance;Bed level Grooming Details (indicate cue type and reason): not performed but anticipate above noted level d/t pain             Lower Body Dressing: Maximal assistance;Sit to/from stand;Bed level Lower Body Dressing Details (indicate cue type and reason): Able to tolerate standing briefly while therapist managed to lower soiled panty; changed panties at bed level with pt assisting only to lift buttocks and roll to hike new panties.   Toilet Transfer Details (indicate cue type and reason): Anticipate ability to transfer to [facility] with min A,  but currently performing toileting at bed level d/t high pain levels Toileting- Clothing Manipulation and Hygiene: Total assistance Toileting - Clothing Manipulation Details (indicate cue type and reason): UI at bed level despite, purewick in place.       General ADL Comments: All ADLs currently limited by pain and generalized weakness      Vision Patient Visual Report: No change from baseline                         Pertinent Vitals/Pain Pain Assessment Pain Score: 9  Pain Location: back Pain Descriptors / Indicators: Grimacing, Guarding Pain Intervention(s): Monitored during session, Limited activity within patient's tolerance, Repositioned, Other (comment) (Niece reported pt had gotten pain meds prior to therapy arrival)     Extremity/Trunk Assessment Upper Extremity Assessment Upper Extremity Assessment: Generalized weakness   Lower Extremity Assessment Lower Extremity Assessment: Defer to PT evaluation   Cervical / Trunk Assessment Cervical / Trunk Assessment: Kyphotic   Communication Communication Communication: No apparent difficulties Cueing Techniques: Verbal cues;Tactile cues   Cognition Arousal: Alert Behavior During Therapy: WFL for tasks assessed/performed Overall Cognitive Status: History of cognitive impairments - at baseline                                       General Comments  on 2-3 L 02 during session; sp02 >90% throughout    Exercises Other Exercises Other Exercises: Educ on OT role; d/c plans   Shoulder Instructions      Home Living Family/patient expects to be discharged to:: Assisted living                             Home Equipment: Rolling Walker (2 wheels);Grab bars - tub/shower   Additional Comments: Handicapped accessible bathroom within ALF      Prior Functioning/Environment Prior Level of Function : Needs assist;Independent/Modified Independent             Mobility Comments: typically independent, however over the past week and a half, patient requiring more assistance for mobility with Rw and ADLs ADLs Comments: ALF assists with shower at baseline, but increased level of assist with ADLs over the last week        OT Problem List: Decreased strength;Decreased activity tolerance;Decreased cognition;Decreased knowledge  of use of DME or AE;Impaired balance (sitting and/or standing);Pain      OT Treatment/Interventions: Self-care/ADL training;Energy conservation;Balance training;Therapeutic exercise;DME and/or AE instruction;Patient/family education    OT Goals(Current goals can be found in the care plan section) Acute Rehab OT Goals Patient Stated Goal: Niece anticipates home with palliative care or hospice but reports family will make final decision later today OT Goal Formulation: With patient/family Time For Goal Achievement: 04/02/23 Potential to Achieve Goals: Good ADL Goals Pt Will Perform Grooming: with set-up;sitting Pt Will Transfer to Toilet: with min assist;bedside commode Pt Will Perform Toileting - Clothing Manipulation and hygiene: with mod assist;sit to/from stand  OT Frequency: Min 1X/week    Co-evaluation PT/OT/SLP Co-Evaluation/Treatment: Yes Reason for Co-Treatment: Complexity of the patient's impairments (multi-system involvement);Other (comment) (Pt with high pain levels and unlikely to tolerate 2 disciplines separately)          AM-PAC OT "6 Clicks" Daily Activity     Outcome Measure Help from another person eating meals?: A Little Help from another person taking care of personal  grooming?: A Little Help from another person toileting, which includes using toliet, bedpan, or urinal?: Total Help from another person bathing (including washing, rinsing, drying)?: A Lot Help from another person to put on and taking off regular upper body clothing?: A Little Help from another person to put on and taking off regular lower body clothing?: Total 6 Click Score: 13   End of Session Equipment Utilized During Treatment: Gait belt;Rolling walker (2 wheels)  Activity Tolerance: Patient limited by pain Patient left: in bed;with call bell/phone within reach;with family/visitor present  OT Visit Diagnosis: Unsteadiness on feet (R26.81);Muscle weakness (generalized) (M62.81);Pain Pain -  part of body:  (back)                Time: 6578-4696 OT Time Calculation (min): 33 min Charges:  OT General Charges $OT Visit: 1 Visit OT Evaluation $OT Eval Moderate Complexity: 1 Mod  Danelle Earthly, MS, OTR/L   Mariah [name] 03/19/2023, 11:50 AM

## 2023-03-19 NOTE — Plan of Care (Signed)
  Problem: Activity: Goal: Risk for activity intolerance will decrease Outcome: Progressing   Problem: Coping: Goal: Level of anxiety will decrease Outcome: Progressing   Problem: Pain Managment: Goal: General experience of comfort will improve Outcome: Progressing   Problem: Safety: Goal: Ability to remain free from injury will improve Outcome: Progressing   

## 2023-03-19 NOTE — Progress Notes (Signed)
Daily Progress Note   Patient Name: Mariah [name]       Date: 03/19/2023 DOB: March 29, 1946  Age: 77 y.o. MRN#: [id number] Attending Physician: Darlin Drop, DO Primary Care Physician: Erasmo Downer, MD Admit Date: 03/17/2023  Reason for Consultation/Follow-up: Establishing goals of care  HPI/Brief Hospital Review: 77 y.o. female  with past medical history of dementia with worsening short-term memory, debility secondary to Alzheimer's dementia, CHF, atrial tachycardia, PAD, history of DVT, AAA, chronic diastolic HF, history of at anonymous polyp of colon, HLD, Legionella infection (07/17/2020) right lumbar radiculitis, vitamin D deficiency, COPD, GERD, psoriatic arthritis, CKD, protein calorie malnutrition, and RLS admitted on 03/17/2023 with shortness of breath.   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   Patient's prognosis is terminal if this is metastatic especially due to her frail health.  PMT was consulted in light of patient's poor prognosis to review goals of care with patient and family.     Subjective: Extensive chart review has been completed prior to meeting patient including labs, vital signs, imaging, progress notes, orders, and available advanced directive documents from current and previous encounters.    Visited with Mariah [name] at her bedside. Resting in bed but opens eyes and acknowledges my presence. Denies acute pain or discomfort. Unable to engage in complex medical decision making. No family at bedside during time of visit.  Called Rayvon Bennett-POA, he again is able to share his understanding of current medical  situation. Mr. Willeen Cass is clear that he wishes to speak with hospice in regards to providing care moving forward.  He requests to be referred to [facility] as he has had a positive experience with them in the past with other relatives-will be in contact with TOC and ACC in AM.  Mr. Willeen Cass is aware that Mariah [name] will likely not be able to return to [facility] as this is ALF. He is aware we will likely be looking into LTC placement for which he agrees.  Symptom burden remains low. Answered and addressed all questions and concerns. PMT to continue to follow for ongoing needs and support.  Thank you for allowing the Palliative Medicine Team to assist in the care of this  patient.  Total time:  25 minutes  Time spent includes: Detailed review of medical records (labs, imaging, vital signs), medically appropriate exam (mental status, respiratory, cardiac, skin), discussed with treatment team, counseling and educating patient, family and staff, documenting clinical information, medication management and coordination of care.  Leeanne Deed, DNP, AGNP-C Palliative Medicine   Please contact Palliative Medicine Team phone at [phone] for questions and concerns.

## 2023-03-19 NOTE — Progress Notes (Signed)
Progress Note   Patient: Mariah [name] ZOX:[id number] DOB: 1946-01-03 DOA: 03/17/2023     1 DOS: the patient was seen and examined on 03/19/2023   Brief hospital course: Taken from H&P   Mariah [name] is a 77 y.o. female with medical history significant for Abdominal aortic aneurysm (AAA) Arthritis, Chronic diastolic heart failure (HCC), COPD , GERD , Headache, History of adenomatous polyp of colon, Hyperlipidemia, Hypertension, Legionella infection (HCC) (07/17/2020), Psoriasis, Right lumbar radiculitis, and Vitamin D deficiency brought from nursing facility for shortness of breath. No CP.  On arrival she was tachycardic, EKG shows sinus tach at 116 PR of 103 QTc prolonged at 514 history of left bundle branch block no acute ST changes. Workup showed elevated troponin of 217 rising to 248, elevated BNP at 477, potasium 2.3,creatinine 1.72, Patient had a CT chest PE protocol today and unfortunately found a large left hilar and mediastinal mass that is concerning for malignancy with metastatic lymphadenopathy with mets to the liver, small left pleural effusion, aortic aneurysm of 4 cm.  CT head with no acute intracranial abnormality but did show new multiple lytic skull lesions most significant in the right parietal bone, suspicious for metastatic disease. Patient's prognosis is poor if this is metastatic especially due to her frail health.   8/29: Vital stable.  Troponin peaked at 248, now trending down, potassium of 2.7, creatinine 1.68, alkaline phosphatase elevated at 220 which seems chronic, T. bili 1.9 and anion gap remained at 17 with no acidosis.  Worsening thrombocytopenia at 67, BNP 477. Patient having some hip pain likely metastatic disease.  Poor underlying functional status. Alert and oriented x 2 but unable to comprehend the severity of this new development. Lengthy discussion with POA at bedside and they are leaning towards comfort care and does not want further testing as she might not  be able to tolerate any treatment for this advanced malignancy.  Palliative care was also consulted.  We will hold off to oncology consult at this time.     03/19/2023: Was seen and examined at bedside this morning.  Also seen by palliative care medicine this morning.  The patient appears very weak.  She has no new complaints.    Assessment and Plan: * Metastatic neoplastic disease (HCC) Patient is a lifetime heavy smoker, CT imaging concerning for widespread metastatic disease likely lung primary.  No prior diagnosis.  Concern of bony, lymph node and liver mets at this time.  Patient with poor underlying functional status.  Discussed with POA, patient is unable to comprehend the gravity of her illness, family is leaning towards comfort care and does not want further imaging, biopsies or treatment as she might not be a candidate for chemotherapy and has pretty advanced disease. -Palliative care consult -Likely be back to her facility/SNF with hospice -Pain management  SOB (shortness of breath) Acute hypoxic respiratory failure.  No baseline oxygen use 2/2 large left lung and mediastinal mass concerning for malignancy with pleural effusion.  Cont with supplemental oxygen as needed.  -Supportive care -Likely be going back with hospice help as family does not want further investigation or treatment   COPD (chronic obstructive pulmonary disease) (HCC) Currently stable.  No wheezing, new oxygen requirement likely secondary to new diagnosis of large lung mass and pleural effusion. -Can use prednisone for next few days to see if that will help. -Continue with bronchodilators  Essential (primary) hypertension Blood pressure within goal. -Continue home metoprolol and keep holding diuretic.   CKD (  chronic kidney disease), stage III (HCC) Seems like slowly worsening renal function over the past few month. Did receive contrast with CTA yesterday.  Creatinine at 1.68. -Monitor renal function  if patient is not transitioned to full comfort care, pending palliative goals of care discussion  Congestive heart failure [facility]) Patient with history of HFpEF, prior echo done in 2022 with normal EF and grade 1 diastolic dysfunction.  Clinically appears euvolemic.  BNP elevated at 477 but patient also has CKD and large hilar mass with pleural effusion. -On as needed Lasix at home -Monitor volume status  Gastro-esophageal reflux disease without esophagitis -Continue PPI  Tobacco abuse Nicotine patch.    Rash and nonspecific skin eruption History of psoriasis. -Continue to monitor     Physical Exam: Vitals:   03/19/23 0725 03/19/23 0737 03/19/23 1201 03/19/23 1620  BP:  133/66 122/86 (!) 144/77  Pulse:  87 68 60  Resp:  (!) 22 18 18   Temp:  (!) 97.5 F (36.4 C) 98 F (36.7 C) 97.8 F (36.6 C)  TempSrc:  Oral Oral Oral  SpO2: 92% 90% 96% 93%  Weight:      Height:       General.  Frail elderly lady, in no acute distress. Pulmonary.  Harsh breath sounds bilaterally, normal respiratory effort. CV.  Regular rate and rhythm, no JVD, rub or murmur. Abdomen.  Soft, nontender, nondistended, BS positive. CNS.  Alert and oriented x 2.  No focal neurologic deficit. Extremities.  Trace LE edema, no cyanosis, pulses intact and symmetrical. Psychiatry.  Seems to have cognitive impairment  Data Reviewed: Prior data reviewed  Family Communication: Discussed with niece and brother-in-law who is also her POA at bedside.  Disposition: Status is: Observation The patient will require care spanning > 2 midnights and should be moved to inpatient because: Severity of illness  Planned Discharge Destination:  To be determined, facility with hospice versus inpatient hospice  DVT prophylaxis.  Subcu heparin Time spent: 50 minutes  This record has been created using Conservation officer, historic buildings. Errors have been sought and corrected,but may not always be located. Such creation errors do  not reflect on the standard of care.   Author: Darlin Drop, DO 03/19/2023 5:33 PM  For on call review [email or web address].

## 2023-03-19 NOTE — Progress Notes (Signed)
Consult to HF Navigation Team Placed. Unfortunately, this patient does not meet criteria given metastatic . Please feel free to reach out with any questions or medication assistance needs.  Thank you for involving the HF Navigation Team in this patient's care.  Enos Fling, PharmD, BCPS Clinical Pharmacist 02/25/2023 12:50 PM

## 2023-03-19 NOTE — Progress Notes (Signed)
Mirna Mires made a good attempt to visit with the Pt at bed side. Pt seen on the bed deep asleep. Mirna Mires will try to see Pt another time.   03/19/23 1400  Spiritual Encounters  Type of Visit Initial;Attempt (pt unavailable)  Care provided to: Pt not available  Referral source Chaplain assessment  Reason for visit Routine spiritual support  OnCall Visit No

## 2023-03-19 NOTE — Plan of Care (Signed)
  Problem: Respiratory: Goal: Ability to maintain a clear airway will improve Outcome: Progressing Goal: Levels of oxygenation will improve Outcome: Progressing   Problem: Safety: Goal: Ability to remain free from injury will improve Outcome: Progressing

## 2023-03-19 NOTE — Progress Notes (Signed)
Unable to get UA due to 2 bowel movements with voids contaminating sample. New purewick, tubing, and canister applied to attempt sample as patient is incontinent.

## 2023-03-20 DIAGNOSIS — N1831 Chronic kidney disease, stage 3a: Secondary | ICD-10-CM | POA: Diagnosis not present

## 2023-03-20 DIAGNOSIS — J441 Chronic obstructive pulmonary disease with (acute) exacerbation: Secondary | ICD-10-CM

## 2023-03-20 DIAGNOSIS — C799 Secondary malignant neoplasm of unspecified site: Secondary | ICD-10-CM | POA: Diagnosis not present

## 2023-03-20 DIAGNOSIS — E440 Moderate protein-calorie malnutrition: Secondary | ICD-10-CM | POA: Insufficient documentation

## 2023-03-20 LAB — BASIC METABOLIC PANEL
Anion gap: 12 (ref 5–15)
BUN: 40 mg/dL — ABNORMAL HIGH (ref 8–23)
CO2: 30 mmol/L (ref 22–32)
Calcium: 9 mg/dL (ref 8.9–10.3)
Chloride: 96 mmol/L — ABNORMAL LOW (ref 98–111)
Creatinine, Ser: 1.08 mg/dL — ABNORMAL HIGH (ref 0.44–1.00)
GFR, Estimated: 53 mL/min — ABNORMAL LOW (ref >60)
Glucose, Bld: 139 mg/dL — ABNORMAL HIGH (ref 70–99)
Potassium: 4 mmol/L (ref 3.5–5.1)
Sodium: 138 mmol/L (ref 135–145)

## 2023-03-20 NOTE — TOC Progression Note (Signed)
Transition of Care [facility]) - Progression Note    Patient Details  Name: Mariah [name] MRN: [id number] Date of Birth: January 09, 1946  Transition of Care [facility]) CM/SW Contact  Liliana Cline, LCSW Phone Number: 03/20/2023, 9:55 AM  Clinical Narrative:    Received update from Palliative NP- patient and POA would like patient to return to [facility] with [facility]. Referral made to [place] with [facility].  Called Tammy at [facility] who states she should be able to take patient back with hospice services, the earliest would be Tuesday and she will need to come assess patient on Tuesday first.    Expected Discharge Plan: Assisted Living (with home health) Barriers to Discharge: Continued Medical Work up  Expected Discharge Plan and Services     Post Acute Care Choice:  (TBD) Living arrangements for the past 2 months: Assisted Living Facility                           HH Arranged: RN [facility] Agency: [facility] Date HH Agency Contacted: 03/18/23   Representative spoke with at [facility] Agency: Feliberto Gottron   Social Determinants of Health (SDOH) Interventions SDOH Screenings   Food Insecurity: No Food Insecurity (10/31/2020)  Housing: Low Risk  (10/31/2020)  Transportation Needs: No Transportation Needs (10/31/2020)  Alcohol Screen: Low Risk  (03/01/2023)  Depression (PHQ2-9): High Risk (03/01/2023)  Financial Resource Strain: Medium Risk (07/10/2019)  Physical Activity: Inactive (07/10/2019)  Social Connections: Moderately Integrated (07/10/2019)  Stress: No Stress Concern Present (10/31/2020)  Tobacco Use: High Risk (03/09/2023)   Received from [facility]    Readmission Risk Interventions     No data to display

## 2023-03-20 NOTE — Progress Notes (Signed)
Progress Note   Patient: Mariah [name] VHQ:[id number] DOB: [birth year] DOA: 03/17/2023     2 DOS: the patient was seen and examined on 03/20/2023   Brief hospital course: Taken from H&P   Mariah [name] is a 77 y.o. female with medical history significant for Abdominal aortic aneurysm (AAA) Arthritis, Chronic diastolic heart failure (HCC), COPD , GERD , Headache, History of adenomatous polyp of colon, Hyperlipidemia, Hypertension, Legionella infection (HCC) (07/17/2020), Psoriasis, Right lumbar radiculitis, and Vitamin D deficiency brought from nursing facility for shortness of breath.   On arrival she was tachycardic, EKG shows sinus tach at 116 PR of 103 QTc prolonged at 514 history of left bundle branch block no acute ST changes. Workup showed elevated troponin of 217 rising to 248, elevated BNP at 477, potasium 2.3,creatinine 1.72, Patient had a CT chest PE protocol showed a large left hilar and mediastinal mass that is concerning for malignancy with metastatic lymphadenopathy with mets to the liver, small left pleural effusion, aortic aneurysm of 4 cm.  CT head with no acute intracranial abnormality but did show new multiple lytic skull lesions most significant in the right parietal bone, suspicious for metastatic disease. Patient's prognosis is poor if this is metastatic especially due to her frail health.  Patient and family opts not to have any additional workup or treatment for cancer.  Palliative care has seen the patient, patient will be discharged to nursing home with hospice care.      Principal Problem:   Metastatic neoplastic disease (HCC) Active Problems:   SOB (shortness of breath)   COPD (chronic obstructive pulmonary disease) (HCC)   Essential (primary) hypertension   CKD (chronic kidney disease), stage III (HCC)   Congestive heart failure (HCC)   Gastro-esophageal reflux disease without esophagitis   Tobacco abuse   Rash and nonspecific skin eruption   Hypokalemia    Elevated troponin   Lung mass   Malnutrition of moderate degree   Assessment and Plan:   Metastatic neoplastic disease (HCC) Patient is a lifetime heavy smoker, CT imaging concerning for widespread metastatic disease likely lung primary.  Concern of bony, lymph node and liver mets at this time.  Patient with poor underlying functional status.  Patient has been seen by palliative care, referred to hospice.  Facility will evaluate him on Tuesday to see if they can take her back with hospice.  Continue symptomatic treatment.   COPD exacerbation. Acute hypoxic respiratory failure.   Patient started to have significant wheezing and short of breath.  Require oxygen.  Prednisone started yesterday.  Continue.   Essential (primary) hypertension Continue home treatment.     CKD (chronic kidney disease), stage IIIa (HCC) Hypokalemia. Renal function stable, potassium has normalized.   Chronic diastolic congestive heart failure (HCC) Patient with history of HFpEF, prior echo done in 2022 with normal EF and grade 1 diastolic dysfunction.   On home dose diuretics, appear euvolemic.   Gastro-esophageal reflux disease without esophagitis -Continue PPI   Tobacco abuse Nicotine patch.      Rash and nonspecific skin eruption History of psoriasis. Condition stable.     Subjective:  Patient has some short of breath and wheezing today.  No cough.  Physical Exam: Vitals:   03/20/23 0500 03/20/23 0624 03/20/23 0726 03/20/23 0918  BP:  (!) 147/91  (!) 151/95  Pulse:  84  98  Resp:      Temp:  98 F (36.7 C)  97.6 F (36.4 C)  TempSrc:  Oral  Oral  SpO2:  91% 93% 92%  Weight: 59.1 kg     Height:       General exam: Appears calm and comfortable  Respiratory system: Decreased breathing sounds with some wheezes.  Respiratory effort normal. Cardiovascular system: S1 & S2 heard, RRR. No JVD, murmurs, rubs, gallops or clicks. No pedal edema. Gastrointestinal system: Abdomen is  nondistended, soft and nontender. No organomegaly or masses felt. Normal bowel sounds heard. Central nervous system: Alert and oriented x2. No focal neurological deficits. Extremities: Symmetric 5 x 5 power. Skin: No rashes, lesions or ulcers Psychiatry:  Mood & affect appropriate.    Data Reviewed:  Chest x-ray and lab results reviewed.  Family Communication: None  Disposition: Status is: Inpatient Remains inpatient appropriate because: Unsafe discharge, pending placement until today.     Time spent: 35 minutes  Author: Marrion Coy, MD 03/20/2023 11:45 AM  For on call review [email or web address].

## 2023-03-20 NOTE — Progress Notes (Addendum)
Civil engineer, contracting Hospice Referral Note  Received request from [place], SW Transitions of Care Manager, for hospice services at [place] at discharge. Ms. Wander is currently followed at [facility] under [facility] Palliative Medicine Program.    Spoke with Donn Pierini, [place], to initiate education related to hospice philosophy, services, and team approach to care. Rayvon verbalized understanding of information given. Per discussion, the plan is for discharge 1.  back to [facility] (if accepted back) or to 2.  LTC with hospice.  Tammy from [facility] will come evaluate Ms. Bozzi on Tuesday morning to see if they are able to offer a bed.  DME needs discussed. Patient currently has a cane. Rayvon is uncertain of other DME in place.  Does not have a hospital bed.  DME will need to be evaluated closer to firm discharge plan.  Please send signed and completed DNR home with patient/family if applicable. Please provide prescriptions at discharge as needed to ensure ongoing symptom management.  [facility] information and contact numbers given to Rayvon.  Update provided to care team at [facility].  Please call with any hospice related questions or concerns. Thank you for the opportunity to participate in this patient's care.  Norris Cross, RN Nurse Liaison [phone]

## 2023-03-20 NOTE — Progress Notes (Signed)
PHARMACY CONSULT NOTE - ELECTROLYTES  Pharmacy Consult for Electrolyte Monitoring and Replacement   Recent Labs: Height: 5\' 1"  (154.9 cm) Weight: 59.1 kg (130 lb 4.7 oz) IBW/kg (Calculated) : 47.8 Estimated Creatinine Clearance: 35.4 mL/min (A) (by C-G formula based on SCr of 1.08 mg/dL (H)). Potassium (mmol/L)  Date Value  03/20/2023 4.0   Magnesium (mg/dL)  Date Value  16/04/9603 2.6 (H)   Calcium  Date Value  03/20/2023 9.0 mg/dL  54/03/8118 9.7   Albumin (g/dL)  Date Value  14/78/2956 3.4 (L)  08/05/2022 4.4   Phosphorus (mg/dL)  Date Value  21/30/8657 2.5   Sodium (mmol/L)  Date Value  03/20/2023 138  08/25/2022 141   Corrected Ca: 9.48 mg/dL  Assessment  Mariah [name] is a 77 y.o. female presenting with SOB. PMH significant for abdominal aortic aneurysm, arthritis, chronic diastolic heart failure, COPD, GERD, hyperlipidemia, hypertension. Pharmacy has been consulted to monitor and replace electrolytes.  Diet: Regular MIVF: N/A Pertinent medications: N/A  Goal of Therapy: Electrolytes WNL  Plan:  No replacement indicated today Check BMP, Mag, Phos tomorrow with AM labs  Thank you for allowing pharmacy to be a part of this patient's care.  Bettey Costa, PharmD Clinical Pharmacist 03/20/2023 7:52 AM

## 2023-03-20 NOTE — Progress Notes (Signed)
Spoke with TOC and [facility] Hospice Liaison, both continue to work on disposition. Review of record and MAR, symptom burden remains low. Encouraged team to reach out to PMT for needs or support.  No Charge.  Leeanne Deed, DNP, AGNP-C Palliative Medicine  Please call Palliative Medicine team phone with any questions [phone]. For individual providers please see AMION.

## 2023-03-20 NOTE — Plan of Care (Signed)
  Problem: Safety: Goal: Ability to remain free from injury will improve Outcome: Progressing   Problem: Pain Managment: Goal: General experience of comfort will improve Outcome: Not Progressing   Problem: Skin Integrity: Goal: Risk for impaired skin integrity will decrease Outcome: Not Progressing  Pain getting progressively worse despite PRN medications. Non-healing skin tears/small open wounds in several areas as documented on admit.

## 2023-03-21 ENCOUNTER — Other Ambulatory Visit: Payer: Self-pay

## 2023-03-21 ENCOUNTER — Encounter: Payer: Self-pay | Admitting: Internal Medicine

## 2023-03-21 DIAGNOSIS — I10 Essential (primary) hypertension: Secondary | ICD-10-CM

## 2023-03-21 DIAGNOSIS — R0602 Shortness of breath: Secondary | ICD-10-CM | POA: Diagnosis not present

## 2023-03-21 DIAGNOSIS — C799 Secondary malignant neoplasm of unspecified site: Secondary | ICD-10-CM | POA: Diagnosis not present

## 2023-03-21 NOTE — Plan of Care (Signed)

## 2023-03-21 NOTE — Plan of Care (Signed)
  Problem: Activity: Goal: Ability to tolerate increased activity will improve Outcome: Progressing   Problem: Clinical Measurements: Goal: Respiratory complications will improve Outcome: Progressing   Problem: Clinical Measurements: Goal: Cardiovascular complication will be avoided Outcome: Progressing   Problem: Activity: Goal: Risk for activity intolerance will decrease Outcome: Progressing   Problem: Coping: Goal: Level of anxiety will decrease Outcome: Progressing   Problem: Safety: Goal: Ability to remain free from injury will improve Outcome: Progressing

## 2023-03-21 NOTE — Progress Notes (Signed)
Progress Note   Patient: Mariah [name] UYQ:[id number] DOB: [birth year] DOA: 03/17/2023     3 DOS: the patient was seen and examined on 03/21/2023   Brief hospital course: Taken from H&P   DONELL ALEEM is a 77 y.o. female with medical history significant for Abdominal aortic aneurysm (AAA) Arthritis, Chronic diastolic heart failure (HCC), COPD , GERD , Headache, History of adenomatous polyp of colon, Hyperlipidemia, Hypertension, Legionella infection (HCC) (07/17/2020), Psoriasis, Right lumbar radiculitis, and Vitamin D deficiency brought from nursing facility for shortness of breath.   On arrival she was tachycardic, EKG shows sinus tach at 116 PR of 103 QTc prolonged at 514 history of left bundle branch block no acute ST changes. Workup showed elevated troponin of 217 rising to 248, elevated BNP at 477, potasium 2.3,creatinine 1.72, Patient had a CT chest PE protocol showed a large left hilar and mediastinal mass that is concerning for malignancy with metastatic lymphadenopathy with mets to the liver, small left pleural effusion, aortic aneurysm of 4 cm.  CT head with no acute intracranial abnormality but did show new multiple lytic skull lesions most significant in the right parietal bone, suspicious for metastatic disease. Patient's prognosis is poor if this is metastatic especially due to her frail health.  Patient and family opts not to have any additional workup or treatment for cancer.  Palliative care has seen the patient, patient will be discharged to nursing home with hospice care.  9/1: Hemodynamically stable, her facility need to reevaluate her on Tuesday morning before accepting her with hospice care.   Principal Problem:   Metastatic neoplastic disease (HCC) Active Problems:   SOB (shortness of breath)   COPD (chronic obstructive pulmonary disease) (HCC)   Essential (primary) hypertension   CKD (chronic kidney disease), stage III (HCC)   Congestive heart failure (HCC)    Gastro-esophageal reflux disease without esophagitis   Tobacco abuse   Rash and nonspecific skin eruption   Hypokalemia   Elevated troponin   Lung mass   Malnutrition of moderate degree   Assessment and Plan:   Metastatic neoplastic disease (HCC) Patient is a lifetime heavy smoker, CT imaging concerning for widespread metastatic disease likely lung primary.  Concern of bony, lymph node and liver mets at this time.  Patient with poor underlying functional status.  Patient has been seen by palliative care, referred to hospice.  Facility will evaluate him on Tuesday to see if they can take her back with hospice.  Continue symptomatic treatment.   COPD exacerbation. Acute hypoxic respiratory failure.   Patient started to have significant wheezing and short of breath.  Require oxygen.  Prednisone started yesterday.  Continue.   Essential (primary) hypertension Continue home treatment.     CKD (chronic kidney disease), stage IIIa (HCC) Hypokalemia. Renal function stable, potassium has normalized.   Chronic diastolic congestive heart failure (HCC) Patient with history of HFpEF, prior echo done in 2022 with normal EF and grade 1 diastolic dysfunction.   On home dose diuretics, appear euvolemic.   Gastro-esophageal reflux disease without esophagitis -Continue PPI   Tobacco abuse Nicotine patch.      Rash and nonspecific skin eruption History of psoriasis. Condition stable.     Subjective:  Patient was seen and examined today.  No new concern  Physical Exam: Vitals:   03/21/23 0500 03/21/23 0727 03/21/23 0806 03/21/23 1143  BP:   103/71 133/89  Pulse:   93 79  Resp:   17 16  Temp:   97.6  F (36.4 C) 97.7 F (36.5 C)  TempSrc:      SpO2:  93% 93% 95%  Weight: 58.9 kg     Height:       General.  Frail and malnourished lady, in no acute distress. Pulmonary.  Lungs clear bilaterally, normal respiratory effort. CV.  Regular rate and rhythm, no JVD, rub or  murmur. Abdomen.  Soft, nontender, nondistended, BS positive. CNS.  Alert and oriented .  No focal neurologic deficit. Extremities.  No edema, no cyanosis, pulses intact and symmetrical.  Data Reviewed: Prior data reviewed  Family Communication: Discussed with 2 close family friends at bedside  Disposition: Status is: Inpatient Remains inpatient appropriate because: Unsafe discharge, pending placement until today.   DVT prophylaxis.  Subcu heparin Time spent: 38 minutes  This record has been created using Conservation officer, historic buildings. Errors have been sought and corrected,but may not always be located. Such creation errors do not reflect on the standard of care.   Author: Arnetha Courser, MD 03/21/2023 4:18 PM  For on call review [email or web address].

## 2023-03-21 NOTE — Progress Notes (Signed)
PHARMACY CONSULT NOTE - ELECTROLYTES  Pharmacy Consult for Electrolyte Monitoring and Replacement   Recent Labs: Height: 5\' 1"  (154.9 cm) Weight: 58.9 kg (129 lb 13.6 oz) IBW/kg (Calculated) : 47.8 Estimated Creatinine Clearance: 35.4 mL/min (A) (by C-G formula based on SCr of 1.08 mg/dL (H)). Potassium (mmol/L)  Date Value  03/20/2023 4.0   Magnesium (mg/dL)  Date Value  82/95/6213 2.6 (H)   Calcium  Date Value  03/20/2023 9.0 mg/dL  08/65/7846 9.7   Albumin (g/dL)  Date Value  96/29/5284 3.4 (L)  08/05/2022 4.4   Phosphorus (mg/dL)  Date Value  13/24/4010 2.5   Sodium (mmol/L)  Date Value  03/20/2023 138  08/25/2022 141   Corrected Ca: 9.48 mg/dL  Assessment  Mariah [name] is a 77 y.o. female presenting with SOB. PMH significant for abdominal aortic aneurysm, arthritis, chronic diastolic heart failure, COPD, GERD, hyperlipidemia, hypertension. Pharmacy has been consulted to monitor and replace electrolytes.  Diet: Regular MIVF: N/A Pertinent medications: N/A  Goal of Therapy: Electrolytes WNL  Plan:  No replacement indicated today Check BMP, Mag, Phos tomorrow with AM labs  Thank you for allowing pharmacy to be a part of this patient's care.  Bettey Costa, PharmD Clinical Pharmacist 03/21/2023 7:36 AM

## 2023-03-22 DIAGNOSIS — R0602 Shortness of breath: Secondary | ICD-10-CM | POA: Diagnosis not present

## 2023-03-22 DIAGNOSIS — C799 Secondary malignant neoplasm of unspecified site: Secondary | ICD-10-CM | POA: Diagnosis not present

## 2023-03-22 DIAGNOSIS — I10 Essential (primary) hypertension: Secondary | ICD-10-CM | POA: Diagnosis not present

## 2023-03-22 LAB — MAGNESIUM: Magnesium: 2.4 mg/dL (ref 1.7–2.4)

## 2023-03-22 LAB — BASIC METABOLIC PANEL
Anion gap: 9 (ref 5–15)
BUN: 41 mg/dL — ABNORMAL HIGH (ref 8–23)
CO2: 33 mmol/L — ABNORMAL HIGH (ref 22–32)
Calcium: 9 mg/dL (ref 8.9–10.3)
Chloride: 96 mmol/L — ABNORMAL LOW (ref 98–111)
Creatinine, Ser: 1.04 mg/dL — ABNORMAL HIGH (ref 0.44–1.00)
GFR, Estimated: 55 mL/min — ABNORMAL LOW (ref >60)
Glucose, Bld: 141 mg/dL — ABNORMAL HIGH (ref 70–99)
Potassium: 4.7 mmol/L (ref 3.5–5.1)
Sodium: 138 mmol/L (ref 135–145)

## 2023-03-22 LAB — PHOSPHORUS: Phosphorus: 1.8 mg/dL — ABNORMAL LOW (ref 2.5–4.6)

## 2023-03-22 MED ORDER — K PHOS MONO-SOD PHOS DI & MONO 155-852-130 MG PO TABS
500.0000 mg | ORAL_TABLET | Freq: Two times a day (BID) | ORAL | Status: AC
  Administered 2023-03-22: 500 mg via ORAL
  Filled 2023-03-22: qty 2

## 2023-03-22 NOTE — Progress Notes (Signed)
PHARMACY CONSULT NOTE - ELECTROLYTES  Pharmacy Consult for Electrolyte Monitoring and Replacement   Recent Labs: Height: 5\' 1"  (154.9 cm) Weight: 56.4 kg (124 lb 5.4 oz) IBW/kg (Calculated) : 47.8 Estimated Creatinine Clearance: 33.6 mL/min (A) (by C-G formula based on SCr of 1.04 mg/dL (H)). Potassium (mmol/L)  Date Value  03/22/2023 4.7   Magnesium (mg/dL)  Date Value  16/04/9603 2.4   Calcium  Date Value  03/22/2023 9.0 mg/dL  54/03/8118 9.7   Albumin (g/dL)  Date Value  14/78/2956 3.4 (L)  08/05/2022 4.4   Phosphorus (mg/dL)  Date Value  21/30/8657 1.8 (L)   Sodium (mmol/L)  Date Value  03/22/2023 138  08/25/2022 141   Assessment  Mariah [name] is a 77 y.o. female presenting with SOB. PMH significant for abdominal aortic aneurysm, arthritis, chronic diastolic heart failure, COPD, GERD, hyperlipidemia, hypertension. Pharmacy has been consulted to monitor and replace electrolytes.  Diet: Regular MIVF: N/A Pertinent medications: N/A  Goal of Therapy: Electrolytes WNL  Plan:  Phos 1.8, K Phos Neutral 2 tablets BIDM x 2 doses Check BMP, Phos tomorrow with AM labs  Thank you for allowing pharmacy to be a part of this patient's care.  Tressie Ellis 03/22/2023 7:53 AM

## 2023-03-22 NOTE — Progress Notes (Signed)
Progress Note   Patient: Mariah [name] ZOX:[id number] DOB: [birth year] DOA: 03/17/2023     4 DOS: the patient was seen and examined on 03/22/2023   Brief hospital course: Taken from H&P   FARHA MESSMAN is a 77 y.o. female with medical history significant for Abdominal aortic aneurysm (AAA) Arthritis, Chronic diastolic heart failure (HCC), COPD , GERD , Headache, History of adenomatous polyp of colon, Hyperlipidemia, Hypertension, Legionella infection (HCC) (07/17/2020), Psoriasis, Right lumbar radiculitis, and Vitamin D deficiency brought from nursing facility for shortness of breath.   On arrival she was tachycardic, EKG shows sinus tach at 116 PR of 103 QTc prolonged at 514 history of left bundle branch block no acute ST changes. Workup showed elevated troponin of 217 rising to 248, elevated BNP at 477, potasium 2.3,creatinine 1.72, Patient had a CT chest PE protocol showed a large left hilar and mediastinal mass that is concerning for malignancy with metastatic lymphadenopathy with mets to the liver, small left pleural effusion, aortic aneurysm of 4 cm.  CT head with no acute intracranial abnormality but did show new multiple lytic skull lesions most significant in the right parietal bone, suspicious for metastatic disease. Patient's prognosis is poor if this is metastatic especially due to her frail health.  Patient and family opts not to have any additional workup or treatment for cancer.  Palliative care has seen the patient, patient will be discharged to nursing home with hospice care.  9/1: Hemodynamically stable, her facility need to reevaluate her on Tuesday morning before accepting her with hospice care.  9/2: Likely be going back to her facility with hospice tomorrow after their evaluation.   Principal Problem:   Metastatic neoplastic disease (HCC) Active Problems:   SOB (shortness of breath)   COPD (chronic obstructive pulmonary disease) (HCC)   Essential (primary) hypertension    CKD (chronic kidney disease), stage III (HCC)   Congestive heart failure (HCC)   Gastro-esophageal reflux disease without esophagitis   Tobacco abuse   Rash and nonspecific skin eruption   Hypokalemia   Elevated troponin   Lung mass   Malnutrition of moderate degree   Assessment and Plan:   Metastatic neoplastic disease (HCC) Patient is a lifetime heavy smoker, CT imaging concerning for widespread metastatic disease likely lung primary.  Concern of bony, lymph node and liver mets at this time.  Patient with poor underlying functional status.  Patient has been seen by palliative care, referred to hospice.  Facility will evaluate him on Tuesday to see if they can take her back with hospice.  Continue symptomatic treatment.   COPD exacerbation. Acute hypoxic respiratory failure.   Patient started to have significant wheezing and short of breath.  Require oxygen.  Prednisone started yesterday.  Continue.   Essential (primary) hypertension Continue home treatment.     CKD (chronic kidney disease), stage IIIa (HCC) Hypokalemia. Renal function stable, potassium has normalized.   Chronic diastolic congestive heart failure (HCC) Patient with history of HFpEF, prior echo done in 2022 with normal EF and grade 1 diastolic dysfunction.   On home dose diuretics, appear euvolemic.   Gastro-esophageal reflux disease without esophagitis -Continue PPI   Tobacco abuse Nicotine patch.      Rash and nonspecific skin eruption History of psoriasis. Condition stable.     Subjective:  Patient was resting comfortably when seen today.  No new concern  Physical Exam: Vitals:   03/22/23 0403 03/22/23 0500 03/22/23 0832 03/22/23 0832  BP: 129/76  (!) 137/124 [name])  137/124  Pulse: 79  89 99  Resp: 14  20 20   Temp: 98 F (36.7 C)     TempSrc: Oral     SpO2: 90%  93% 94%  Weight:  56.4 kg    Height:       General.  Somnolent elderly lady, in no acute distress. Pulmonary.  Lungs clear  bilaterally, normal respiratory effort. CV.  Regular rate and rhythm, no JVD, rub or murmur. Abdomen.  Soft, nontender, nondistended, BS positive. CNS.  Little somnolent.  No focal neurologic deficit. Extremities.  No edema, no cyanosis, pulses intact and symmetrical.  Data Reviewed: Prior data reviewed  Family Communication: No family at bedside  Disposition: Status is: Inpatient Remains inpatient appropriate because: Unsafe discharge, pending placement until today.   DVT prophylaxis.  Subcu heparin Time spent: 36 minutes  This record has been created using Conservation officer, historic buildings. Errors have been sought and corrected,but may not always be located. Such creation errors do not reflect on the standard of care.   Author: Arnetha Courser, MD 03/22/2023 2:37 PM  For on call review [email or web address].

## 2023-03-22 NOTE — Progress Notes (Signed)
[facility] Liaison Note  Follow up on new hospice referral upon discharge from [facility].  Awaiting discharge plan.  Please do not hesitate to call with any hospice related questions or concerns.  Norris Cross, RN Nurse Liaison [phone]

## 2023-03-23 DIAGNOSIS — R0602 Shortness of breath: Secondary | ICD-10-CM | POA: Diagnosis not present

## 2023-03-23 DIAGNOSIS — I10 Essential (primary) hypertension: Secondary | ICD-10-CM | POA: Diagnosis not present

## 2023-03-23 DIAGNOSIS — E440 Moderate protein-calorie malnutrition: Secondary | ICD-10-CM

## 2023-03-23 DIAGNOSIS — R918 Other nonspecific abnormal finding of lung field: Secondary | ICD-10-CM | POA: Diagnosis not present

## 2023-03-23 DIAGNOSIS — C799 Secondary malignant neoplasm of unspecified site: Secondary | ICD-10-CM | POA: Diagnosis not present

## 2023-03-23 LAB — BASIC METABOLIC PANEL
Anion gap: 12 (ref 5–15)
BUN: 48 mg/dL — ABNORMAL HIGH (ref 8–23)
CO2: 31 mmol/L (ref 22–32)
Calcium: 9.5 mg/dL (ref 8.9–10.3)
Chloride: 96 mmol/L — ABNORMAL LOW (ref 98–111)
Creatinine, Ser: 1.23 mg/dL — ABNORMAL HIGH (ref 0.44–1.00)
GFR, Estimated: 45 mL/min — ABNORMAL LOW (ref >60)
Glucose, Bld: 168 mg/dL — ABNORMAL HIGH (ref 70–99)
Potassium: 4.9 mmol/L (ref 3.5–5.1)
Sodium: 139 mmol/L (ref 135–145)

## 2023-03-23 LAB — PHOSPHORUS: Phosphorus: 2.3 mg/dL — ABNORMAL LOW (ref 2.5–4.6)

## 2023-03-23 MED ORDER — ENSURE ENLIVE PO LIQD: 237.0000 mL | Freq: Two times a day (BID) | ORAL | Status: DC

## 2023-03-23 MED ORDER — NICOTINE 14 MG/24HR TD PT24: 14.0000 mg | MEDICATED_PATCH | Freq: Every day | TRANSDERMAL | Status: DC

## 2023-03-23 MED ORDER — IPRATROPIUM-ALBUTEROL 0.5-2.5 (3) MG/3ML IN SOLN: 3.0000 mL | Freq: Two times a day (BID) | RESPIRATORY_TRACT | Status: DC

## 2023-03-23 MED ORDER — K PHOS MONO-SOD PHOS DI & MONO 155-852-130 MG PO TABS
500.0000 mg | ORAL_TABLET | Freq: Two times a day (BID) | ORAL | Status: AC
  Administered 2023-03-23: 500 mg via ORAL
  Filled 2023-03-23: qty 2

## 2023-03-23 MED ORDER — HYDROMORPHONE HCL 1 MG/ML IJ SOLN
1.0000 mg | INTRAMUSCULAR | Status: DC | PRN
  Administered 2023-03-23 (×3): 1 mg via INTRAVENOUS
  Filled 2023-03-23 (×3): qty 1

## 2023-03-23 NOTE — Progress Notes (Signed)
PHARMACY CONSULT NOTE - ELECTROLYTES  Pharmacy Consult for Electrolyte Monitoring and Replacement   Recent Labs: Height: 5\' 1"  (154.9 cm) Weight: 58.8 kg (129 lb 10.1 oz) IBW/kg (Calculated) : 47.8 Estimated Creatinine Clearance: 31.1 mL/min (A) (by C-G formula based on SCr of 1.23 mg/dL (H)). Potassium (mmol/L)  Date Value  03/23/2023 4.9   Magnesium (mg/dL)  Date Value  16/04/9603 2.4   Calcium  Date Value  03/23/2023 9.5 mg/dL  54/03/8118 9.7   Albumin (g/dL)  Date Value  14/78/2956 3.4 (L)  08/05/2022 4.4   Phosphorus (mg/dL)  Date Value  21/30/8657 2.3 (L)   Sodium (mmol/L)  Date Value  03/23/2023 139  08/25/2022 141   Assessment  Mariah [name] is a 77 y.o. female presenting with SOB. PMH significant for abdominal aortic aneurysm, arthritis, chronic diastolic heart failure, COPD, GERD, hyperlipidemia, hypertension. Pharmacy has been consulted to monitor and replace electrolytes.  Diet: Regular MIVF: N/A Pertinent medications: N/A  Goal of Therapy: Electrolytes WNL  Plan:  Give K Phos Neutral 2 tablets with meal x1 Check BMP, Mg, and Phos tomorrow with AM labs  Thank you for involving pharmacy in this patient's care.   Rockwell Alexandria, PharmD Clinical Pharmacist 03/23/2023 7:44 AM

## 2023-03-23 NOTE — TOC Transition Note (Signed)
Transition of Care [facility]) - CM/SW Discharge Note   Patient Details  Name: Mariah [name] MRN: [id number] Date of Birth: 16-Jan-1946  [facility] CM/SW Contact:  Garret Reddish, RN Phone Number: 03/23/2023, 2:37 PM   Clinical Narrative:     Chart reviewed.  Noted that patient will be a discharge to [facility] today.    [facility] will accept patient to assist with pain management.    [facility] will arranged EMS transport for patient to be transported to the Hospice Home today.    I have informed staff nurse of the above information.    Final next level of care: Hospice Medical Facility (Will be going to Inpatient Hospice Home with [facility].) Barriers to Discharge: No Barriers Identified   Patient Goals and CMS Choice CMS Medicare.gov Compare Post Acute Care list provided to:: Patient Represenative (must comment) ([facility])) Choice offered to / list presented to : [facility] POA / Guardian  Discharge Placement                    Name of family member notified: Rayvon Willeen Cass Patient and family notified of of transfer: 03/23/23  Discharge Plan and Services Additional resources added to the After Visit Summary for       Post Acute Care Choice:  (TBD)                    HH Arranged: RN HH Agency: [facility] Date HH Agency Contacted: 03/18/23   Representative spoke with at [facility] Agency: Feliberto Gottron  Social Determinants of Health (SDOH) Interventions SDOH Screenings   Food Insecurity: Patient Declined (03/21/2023)  Housing: Patient Declined (03/21/2023)  Transportation Needs: No Transportation Needs (03/21/2023)  Utilities: Not At Risk (03/21/2023)  Alcohol Screen: Low Risk  (03/01/2023)  Depression (PHQ2-9): High Risk (03/01/2023)  Financial Resource Strain: Medium Risk (07/10/2019)  Physical Activity: Inactive (07/10/2019)  Social Connections: Moderately Integrated (07/10/2019)  Stress: No  Stress Concern Present (10/31/2020)  Tobacco Use: High Risk (03/21/2023)     Readmission Risk Interventions     No data to display

## 2023-03-23 NOTE — Plan of Care (Signed)
  Problem: Education: Goal: Knowledge of disease or condition will improve Outcome: Progressing Goal: Knowledge of the prescribed therapeutic regimen will improve Outcome: Progressing Goal: Individualized Educational Video(s) Outcome: Progressing   Problem: Activity: Goal: Will verbalize the importance of balancing activity with adequate rest periods Outcome: Progressing   Problem: Respiratory: Goal: Ability to maintain a clear airway will improve Outcome: Progressing Goal: Levels of oxygenation will improve Outcome: Progressing Goal: Ability to maintain adequate ventilation will improve Outcome: Progressing   Problem: Education: Goal: Knowledge of General Education information will improve Description: Including pain rating scale, medication(s)/side effects and non-pharmacologic comfort measures Outcome: Progressing   Problem: Health Behavior/Discharge Planning: Goal: Ability to manage health-related needs will improve Outcome: Progressing   Problem: Clinical Measurements: Goal: Ability to maintain clinical measurements within normal limits will improve Outcome: Progressing Goal: Will remain free from infection Outcome: Progressing Goal: Diagnostic test results will improve Outcome: Progressing Goal: Respiratory complications will improve Outcome: Progressing Goal: Cardiovascular complication will be avoided Outcome: Progressing   Problem: Activity: Goal: Risk for activity intolerance will decrease Outcome: Progressing   Problem: Nutrition: Goal: Adequate nutrition will be maintained Outcome: Progressing   Problem: Coping: Goal: Level of anxiety will decrease Outcome: Progressing   Problem: Elimination: Goal: Will not experience complications related to bowel motility Outcome: Progressing Goal: Will not experience complications related to urinary retention Outcome: Progressing   Problem: Safety: Goal: Ability to remain free from injury will  improve Outcome: Progressing   Problem: Skin Integrity: Goal: Risk for impaired skin integrity will decrease Outcome: Progressing   Problem: Activity: Goal: Ability to tolerate increased activity will improve Outcome: Not Progressing   Problem: Pain Managment: Goal: General experience of comfort will improve Outcome: Not Progressing  Patient still complains of pain even with PRN medication being given.

## 2023-03-23 NOTE — Progress Notes (Signed)
[facility] Liaison Note:  According to RN and MD, patient is experiencing pain that is difficult to manage.  They asked that patient be assessed for possible admission to the Hospice Home  Patient was approved for admission to  the Hospice Home for symptom management needs.  HL went over criteria for admission to the hospice home and what Medicare pays for at the Hospice Home with the patient's POA.   POA accepted bed today.  They will sign consents for admission today.  EMS will transport this afternoon.    Please call with any Hospice related questions or concerns.    Thank you for the opportunity to participate in this patient's care    [facility] Liaison 336 [phone]

## 2023-03-23 NOTE — Care Management Important Message (Signed)
Important Message  Patient Details  Name: Mariah [name] MRN: [id number] Date of Birth: 10-04-1945   Medicare Important Message Given:  Other (see comment)  Disposition to discharge to hospice home.  Medicare IM withheld at this time out of respect for patient and family.   Johnell Comings 03/23/2023, 3:00 PM

## 2023-03-23 NOTE — Discharge Summary (Signed)
Physician Discharge Summary   Patient: Mariah [name] MRN: [id number] DOB: 09/22/1945  Admit date:     03/17/2023  Discharge date: 03/23/23  Discharge Physician: Arnetha Courser   PCP: Erasmo Downer, MD   Recommendations at discharge:  Patient is being discharged to hospice facility  Discharge Diagnoses: Principal Problem:   Metastatic neoplastic disease [facility]) Active Problems:   SOB (shortness of breath)   COPD (chronic obstructive pulmonary disease) (HCC)   Essential (primary) hypertension   CKD (chronic kidney disease), stage III (HCC)   Congestive heart failure (HCC)   Gastro-esophageal reflux disease without esophagitis   Tobacco abuse   Rash and nonspecific skin eruption   Hypokalemia   Elevated troponin   Lung mass   Malnutrition of moderate degree   Hospital Course: Taken from H&P   EDIT JIMINIAN is a 77 y.o. female with medical history significant for Abdominal aortic aneurysm (AAA) Arthritis, Chronic diastolic heart failure (HCC), COPD , GERD , Headache, History of adenomatous polyp of colon, Hyperlipidemia, Hypertension, Legionella infection (HCC) (07/17/2020), Psoriasis, Right lumbar radiculitis, and Vitamin D deficiency brought from nursing facility for shortness of breath. No CP.  On arrival she was tachycardic, EKG shows sinus tach at 116 PR of 103 QTc prolonged at 514 history of left bundle branch block no acute ST changes. Workup showed elevated troponin of 217 rising to 248, elevated BNP at 477, potasium 2.3,creatinine 1.72, Patient had a CT chest PE protocol today and unfortunately found a large left hilar and mediastinal mass that is concerning for malignancy with metastatic lymphadenopathy with mets to the liver, small left pleural effusion, aortic aneurysm of 4 cm.  CT head with no acute intracranial abnormality but did show new multiple lytic skull lesions most significant in the right parietal bone, suspicious for metastatic disease.  Patient had a poor  prognosis due to this new diagnosis of advanced malignancy with extensive metastatic disease. She will not be a candidate for any advanced treatment or chemotherapy based on poor underlying functional status.  Had a lengthy discussion with POA and they do not want any further testing and would like her to go back to her facility with hospice help.  We were having difficulty controlling patient's pain, requiring a lot of IV medication as p.o. or was not helping which will make it difficult to go back to her assisted living facility with hospice.  Patient need more symptom management specially with her pain with extensive metastatic disease.  Patient is being discharged to hospice facility for end-of-life care and pain management.   Assessment and Plan: * Metastatic neoplastic disease (HCC) Patient is a lifetime heavy smoker, CT imaging concerning for widespread metastatic disease likely lung primary.  No prior diagnosis.  Concern of bony, lymph node and liver mets at this time.  Patient with poor underlying functional status.  Discussed with POA, patient is unable to comprehend the gravity of her illness, family is leaning towards comfort care and does not want further imaging, biopsies or treatment as she might not be a candidate for chemotherapy and has pretty advanced disease. -Palliative care consult -Likely be back to her facility/SNF with hospice -Pain management  SOB (shortness of breath) Acute hypoxic respiratory failure.  No baseline oxygen use 2/2 large left lung and mediastinal mass concerning for malignancy with pleural effusion.  Cont with supplemental oxygen as needed.  -Supportive care -Likely be going back with hospice help as family does not want further investigation or treatment   COPD (  chronic obstructive pulmonary disease) (HCC) Currently stable.  No wheezing, new oxygen requirement likely secondary to new diagnosis of large lung mass and pleural effusion. -Can use  prednisone for next few days to see if that will help. -Continue with bronchodilators  Essential (primary) hypertension Blood pressure within goal. -Continue home metoprolol and keep holding diuretic.   CKD (chronic kidney disease), stage III (HCC) Seems like slowly worsening renal function over the past few month. Did receive contrast with CTA yesterday.  Creatinine at 1.68. -Monitor renal function if patient is not transitioned to full comfort care, pending palliative goals of care discussion  Congestive heart failure [facility]) Patient with history of HFpEF, prior echo done in 2022 with normal EF and grade 1 diastolic dysfunction.  Clinically appears euvolemic.  BNP elevated at 477 but patient also has CKD and large hilar mass with pleural effusion. -On as needed Lasix at home -Monitor volume status  Gastro-esophageal reflux disease without esophagitis -Continue PPI  Tobacco abuse Nicotine patch.    Rash and nonspecific skin eruption History of psoriasis. -Continue to monitor        Pain control - [facility] Controlled Substance Reporting System database was reviewed. and patient was instructed, not to drive, operate heavy machinery, perform activities at heights, swimming or participation in water activities or provide baby-sitting services while on Pain, Sleep and Anxiety Medications; until their outpatient Physician has advised to do so again. Also recommended to not to take more than prescribed Pain, Sleep and Anxiety Medications.  Consultants: Palliative care Procedures performed: None Disposition: Hospice care Diet recommendation:  Discharge Diet Orders (From admission, onward)     Start     Ordered   03/23/23 0000  Diet - low sodium heart healthy        03/23/23 1149           Regular diet DISCHARGE MEDICATION: Allergies as of 03/23/2023       Reactions   Amoxicillin Diarrhea   Clindamycin/lincomycin    diarrhea        Medication List     STOP  taking these medications    aspirin EC 81 MG tablet   calcipotriene 0.005 % cream Commonly known as: DOVONOX   Calcipotriene 0.005 % solution   clobetasol 0.05 % external solution Commonly known as: TEMOVATE   furosemide 20 MG tablet Commonly known as: LASIX   Tremfya 100 MG/ML pen Generic drug: guselkumab   Vitamin D3 50 MCG (2000 UT) Tabs       TAKE these medications    albuterol 108 (90 Base) MCG/ACT inhaler Commonly known as: Ventolin HFA USE 2 PUFFS BY MOUTH EVERY 6 HOURS AS NEEDED FOR SHORTNESS OF BREATH.   augmented betamethasone dipropionate 0.05 % ointment Commonly known as: DIPROLENE-AF APPLY TOPICALLY TO AFFECTED AREA ONCE A DAY.   Breo Ellipta 100-25 MCG/ACT Aepb Generic drug: fluticasone furoate-vilanterol USE ONE INHALATION BY MOUTH ONCE A DAY. RINSE MOUTH AFTER EACH USE.   Cosentyx Sensoready (300 MG) 150 MG/ML Soaj Generic drug: Secukinumab (300 MG Dose) Inject 2 Syringes into the skin every 28 (twenty-eight) days.   donepezil 10 MG tablet Commonly known as: ARICEPT Take 10 mg by mouth daily.   feeding supplement Liqd Take 237 mLs by mouth 2 (two) times daily between meals.   folic acid 1 MG tablet Commonly known as: FOLVITE Take 1 mg by mouth daily.   gabapentin 300 MG capsule Commonly known as: NEURONTIN TAKE (1) CAPSULE BY MOUTH TWICE A DAY. (MAY OPEN &  SPRINKLE ON FOOD.)   ipratropium-albuterol 0.5-2.5 (3) MG/3ML Soln Commonly known as: DUONEB Take 3 mLs by nebulization 2 (two) times daily.   levocetirizine 5 MG tablet Commonly known as: XYZAL TAKE (1) TABLET BY MOUTH ONCE A DAY IN THE EVENING   memantine 5 MG tablet Commonly known as: NAMENDA Take 1 tablet by mouth daily.   metoprolol succinate 25 MG 24 hr tablet Commonly known as: TOPROL-XL Take 1 tablet (25 mg total) by mouth daily.   Minerin Creme Crea Apply 1 Application topically daily with breakfast.   nicotine 14 mg/24hr patch Commonly known as: NICODERM CQ -  dosed in mg/24 hours Place 1 patch (14 mg total) onto the skin daily. Start taking on: March 24, 2023   oxyCODONE 5 MG immediate release tablet Commonly known as: Oxy IR/ROXICODONE Take by mouth.   pantoprazole 20 MG tablet Commonly known as: PROTONIX TAKE 1 TABLET (20 MG TOTAL) BY MOUTH 2 (TWO) TIMES DAILY BEFORE A MEAL.   rOPINIRole 1 MG tablet Commonly known as: REQUIP TAKE 1 TABLET BY MOUTH EVERYDAY AT BEDTIME   traMADol 50 MG tablet Commonly known as: ULTRAM Take 50 mg by mouth every 6 (six) hours as needed.   traZODone 50 MG tablet Commonly known as: DESYREL Take 0.5-1 tablets (25-50 mg total) by mouth at bedtime as needed for sleep.               Discharge Care Instructions  (From admission, onward)           Start     Ordered   03/23/23 0000  No dressing needed        03/23/23 1149            Follow-up Information     Bacigalupo, Marzella Schlein, MD .   Specialty: Family Medicine Contact information: [address] [place] [address] [phone]                Discharge Exam: Ceasar Mons Weights   03/21/23 0500 03/22/23 0500 03/23/23 0118  Weight: 58.9 kg 56.4 kg 58.8 kg   General.  Frail and severely malnourished elderly lady, appears to be in pain.   Pulmonary.  Harsh breath sounds bilaterally, normal respiratory effort. CV.  Regular rate and rhythm, no JVD, rub or murmur. Abdomen.  Soft, nontender, nondistended, BS positive. CNS.  Alert and oriented .  No focal neurologic deficit. Extremities.  No edema, no cyanosis, pulses intact and symmetrical. Psychiatry.  Judgment and insight appears impaired  Condition at discharge: stable  The results of significant diagnostics from this hospitalization (including imaging, microbiology, ancillary and laboratory) are listed below for reference.   Imaging Studies: CT Angio Chest PE W and/or Wo Contrast  Result Date: 03/17/2023 CLINICAL DATA:  Pulmonary embolus suspected with  high probability. Follow-up abnormal left hilum on radiographs. Lethargy and shortness of breath. EXAM: CT ANGIOGRAPHY CHEST WITH CONTRAST TECHNIQUE: Multidetector CT imaging of the chest was performed using the standard protocol during bolus administration of intravenous contrast. Multiplanar CT image reconstructions and MIPs were obtained to evaluate the vascular anatomy. RADIATION DOSE REDUCTION: This exam was performed according to the departmental dose-optimization program which includes automated exposure control, adjustment of the mA and/or kV according to patient size and/or use of iterative reconstruction technique. CONTRAST:  60mL OMNIPAQUE IOHEXOL 350 MG/ML SOLN COMPARISON:  Chest radiograph 03/17/2023.  CT chest 06/02/2020 FINDINGS: Cardiovascular: Good opacification of the central, segmental, and pulmonary arteries. No focal filling defects. No evidence of significant  pulmonary embolus. Mild cardiac enlargement. Small pericardial effusion. Ascending aortic aneurysm measuring 4 cm diameter. No dissection. Calcification of the aorta and coronary arteries. Mediastinum/Nodes: Thyroid gland is unremarkable. Esophagus is decompressed. Secretions in the trachea. There is extensive left hilar and perihilar mass or lymphadenopathy with large mass extending into the anterior left hilum, aortopulmonic window region, and anterior mediastinum. At greatest extent, the mass measures 4.6 x 8.2 cm diameter. There is also mediastinal lymphadenopathy with subcarinal lymph nodes measuring up to 2.3 cm diameter. These changes are progressed since the previous study and likely represent malignancy. Lungs/Pleura: Small left pleural effusion with basilar atelectasis. Postobstructive consolidation or atelectasis in the left lingula. Right lung is clear. Diffuse emphysematous changes bilaterally. Upper Abdomen: Hepatic cirrhosis. Heterogeneous vague nodularity of the liver is suspicious for diffuse liver metastasis. Regenerative  nodules would be a less likely consideration. Spleen is enlarged. Musculoskeletal: Degenerative changes in the spine. No focal bone lesions Review of the MIP images confirms the above findings. IMPRESSION: 1. Large left hilar and mediastinal mass is likely malignant. Probably metastatic mediastinal lymph nodes. 2. Heterogeneous nodular appearance of the liver likely representing diffuse liver metastasis. The liver does appear cirrhotic and regenerative nodules would be a less likely consideration. 3. Small left pleural effusion with basilar atelectasis. Likely postobstructive changes in the left lingula. 4. Aortic aneurysm measuring 4 cm diameter. Aortic atherosclerosis. Recommend annual imaging followup by CTA or MRA. This recommendation follows 2010 ACCF/AHA/AATS/ACR/ASA/SCA/SCAI/SIR/STS/SVM Guidelines for the Diagnosis and Management of Patients with Thoracic Aortic Disease. Circulation. 2010; 121: Z610-R604. Aortic aneurysm NOS (ICD10-I71.9) 5. Emphysematous changes in the lungs. Electronically Signed   By: Burman Nieves M.D.   On: 03/17/2023 20:46   CT Head Wo Contrast  Result Date: 03/17/2023 CLINICAL DATA:  Mental status change EXAM: CT HEAD WITHOUT CONTRAST TECHNIQUE: Contiguous axial images were obtained from the base of the skull through the vertex without intravenous contrast. RADIATION DOSE REDUCTION: This exam was performed according to the departmental dose-optimization program which includes automated exposure control, adjustment of the mA and/or kV according to patient size and/or use of iterative reconstruction technique. COMPARISON:  Head CT 09/17/2020 FINDINGS: Brain: No evidence of acute infarction, hemorrhage, hydrocephalus, extra-axial collection or mass lesion/mass effect. Again seen is moderate diffuse atrophy. Vascular: No hyperdense vessel or unexpected calcification. Skull: No acute fracture identified. There are new multiple lytic skull lesions most significant in the right  parietal bone where there is intracranial and extracranial soft tissue component. This lesion measures 1.9 x 1.0 cm on image 2/54. Sinuses/Orbits: No acute finding. Other: None. IMPRESSION: 1. No acute intracranial abnormality. 2. There are new multiple lytic skull lesions most significant in the right parietal bone where there is intracranial and extracranial soft tissue component. This lesion measures 1.9 x 1.0 cm. Findings are suspicious for metastatic disease. Electronically Signed   By: Darliss Cheney M.D.   On: 03/17/2023 20:41   DG Chest Port 1 View  Result Date: 03/17/2023 CLINICAL DATA:  Shortness of breath and cough EXAM: PORTABLE CHEST 1 VIEW COMPARISON:  09/17/2020 FINDINGS: Atherosclerotic calcification of the aortic arch. Bandlike density in the left mid lung. Blunting of the left lateral costophrenic angle compatible with small to moderate left pleural effusion. Fullness of the left hilum/pulmonary arterial margin, cannot exclude adenopathy, mass, or local airspace opacity. Atherosclerotic calcification of the aortic arch.  Emphysema. IMPRESSION: 1. Fullness of the left hilum/pulmonary arterial margin, cannot exclude adenopathy, mass, or local airspace opacity. Chest CT with contrast is recommended for  further characterization. 2. Small to moderate left pleural effusion. 3. Bandlike density in the left mid lung, probably atelectasis, cannot exclude pneumonia. 4. Aortic Atherosclerosis (ICD10-I70.0) and Emphysema (ICD10-J43.9). Electronically Signed   By: Gaylyn Rong M.D.   On: 03/17/2023 19:55    Microbiology: Results for orders placed or performed in visit on 03/01/23  Urine Culture     Status: Abnormal   Collection Time: 03/01/23 10:03 AM   Specimen: Urine   Urine  Result Value Ref Range Status   Urine Culture, Routine Final report (A)  Final   Organism ID, Bacteria Escherichia coli (A)  Final    Comment: Cefazolin <=4 ug/mL Cefazolin with an MIC <=16 predicts susceptibility to  the oral agents cefaclor, cefdinir, cefpodoxime, cefprozil, cefuroxime, cephalexin, and loracarbef when used for therapy of uncomplicated urinary tract infections due to E. coli, Klebsiella pneumoniae, and Proteus mirabilis. Greater than 100,000 colony forming units per mL    ORGANISM ID, BACTERIA Comment  Final    Comment: Mixed urogenital flora 10,000-25,000 colony forming units per mL    Antimicrobial Susceptibility Comment  Final    Comment:       ** S = Susceptible; I = Intermediate; R = Resistant **                    P = Positive; N = Negative             MICS are expressed in micrograms per mL    Antibiotic                 RSLT#1    RSLT#2    RSLT#3    RSLT#4 Amoxicillin/Clavulanic Acid    S Ampicillin                     S Cefepime                       S Ceftriaxone                    S Cefuroxime                     S Ciprofloxacin                  S Ertapenem                      S Gentamicin                     S Imipenem                       S Levofloxacin                   S Meropenem                      S Nitrofurantoin                 S Piperacillin/Tazobactam        S Tetracycline                   S Tobramycin                     S Trimethoprim/Sulfa             S  Labs: CBC: Recent Labs  Lab 03/17/23 1932 03/18/23 0252  WBC 10.2 8.3  NEUTROABS 8.8*  --   HGB 15.6* 14.5  HCT 45.7 41.9  MCV 90.3 89.7  PLT 81* 67*   Basic Metabolic Panel: Recent Labs  Lab 03/17/23 1932 03/18/23 0252 03/19/23 0534 03/20/23 0427 03/22/23 0518 03/23/23 0604  NA 140 138 144 138 138 139  K 2.3* 2.7* 3.2* 4.0 4.7 4.9  CL 94* 91* 93* 96* 96* 96*  CO2 29 30 33* 30 33* 31  GLUCOSE 153* 176* 163* 139* 141* 168*  BUN 57* 58* 48* 40* 41* 48*  CREATININE 1.72* 1.68* 1.18* 1.08* 1.04* 1.23*  CALCIUM 9.0 8.9 9.6 9.0 9.0 9.5  MG 2.6*  --  2.6*  --  2.4  --   PHOS  --   --  2.5  --  1.8* 2.3*   Liver Function Tests: Recent Labs  Lab 03/17/23 1932  03/18/23 0252  AST 80* 73*  ALT 42 41  ALKPHOS 223* 220*  BILITOT 1.6* 1.9*  PROT 6.7 6.4*  ALBUMIN 3.6 3.4*   CBG: No results for input(s): "GLUCAP" in the last 168 hours.  Discharge time spent: greater than 30 minutes.  This record has been created using Conservation officer, historic buildings. Errors have been sought and corrected,but may not always be located. Such creation errors do not reflect on the standard of care.   Signed: Arnetha Courser, MD [facility] 03/23/2023

## 2023-03-23 NOTE — Consult Note (Signed)
[facility] Customer service manager [facility]) Accountable Care Organization (ACO) [facility] Liaison Note  03/23/2023  Mariah [name] 07/26/1945 [id number]  Location: [facility] RN Hospital Liaison screened the patient remotely at [facility].  Insurance: [employer]   Mariah [name] is a 77 y.o. female who is a Primary Care Patient of Beryle Flock, Marzella Schlein, MD. The patient was screened for readmission hospitalization with noted high risk score for unplanned readmission risk with 1 ED in 6 months.  The patient was assessed for potential [facility]) Care Management service needs for post hospital transition for care coordination. Review of patient's electronic medical record reveals patient was admitted with  SOB/Hypokalemia/Lung Cancer. Pt will return to her ALF with [facility]) services. Facility will continue to manage her ongoing needs.   [facility] Care Management/Population Health does not replace or interfere with any arrangements made by the Inpatient Transition of Care team.   For questions contact:   Elliot Cousin, RN, [facility] Liaison [place]   Population Health Office Hours MTWF  8:00 am-6:00 pm [phone] mobile [phone] [Office toll free line] Office Hours are M-F 8:30 - 5 pm [email or web address]

## 2023-03-23 NOTE — Progress Notes (Signed)
Physical Therapy Treatment Patient Details Name: Mariah [name] MRN: [id number] DOB: 03/15/1946 Today's Date: 03/23/2023   History of Present Illness 77 y/o female presented to ED on 03/17/23 for SOB. CT chest showed large L hilar and mediastinal mass that is concerning for malignancy with metastatic lymphadenopathy with mets to the liver. PMH: DVT, COPD, AAA, GERD, HLD, HTN, dementia, CHF    PT Comments  Upon arrival, pt generally uncomfortable and in pain moaning and rolling in bed.  Pt asking for pain meds and RN is contacted and brings in IV meds during session.  Pt is asked and does want to try to sit to relieve pain.  Asissted to EOB with mod a x 1 where she is able to sit with close supervision for about 5 minutes before needing mod a x 2 to return to supine for comfort.  IV pain meds do seem to help with pain during session but she is unable to tolerate attempts at standing or gait today.     If plan is discharge home, recommend the following: Assistance with cooking/housework;Direct supervision/assist for medications management;Direct supervision/assist for financial management;Assist for transportation;Help with stairs or ramp for entrance;Supervision due to cognitive status;A lot of help with walking and/or transfers;A lot of help with bathing/dressing/bathroom   Can travel by private vehicle        Equipment Recommendations  None recommended by PT    Recommendations for Other Services       Precautions / Restrictions Precautions Precautions: Fall Precaution Comments: pain Restrictions Weight Bearing Restrictions: No     Mobility  Bed Mobility Overal bed mobility: Needs Assistance Bed Mobility: Supine to Sit, Sit to Supine     Supine to sit: Mod assist Sit to supine: Mod assist, +2 for physical assistance   General bed mobility comments: increased assist to return to bed for pt comfort    Transfers                        Ambulation/Gait                General Gait Details: unable today   Stairs             Wheelchair Mobility     Tilt Bed    Modified Rankin (Stroke Patients Only)       Balance Overall balance assessment: Needs assistance   Sitting balance-Leahy Scale: Fair                                      Cognition Arousal: Alert Behavior During Therapy: WFL for tasks assessed/performed Overall Cognitive Status: History of cognitive impairments - at baseline                                          Exercises      General Comments        Pertinent Vitals/Pain Pain Assessment Pain Assessment: Faces Faces Pain Scale: Hurts whole lot Pain Location: back Pain Descriptors / Indicators: Grimacing, Guarding Pain Intervention(s): Limited activity within patient's tolerance, Monitored during session, RN gave pain meds during session, Repositioned    Home Living                          Prior  Function            PT Goals (current goals can now be found in the care plan section) Progress towards PT goals: Not progressing toward goals - comment    Frequency    Min 1X/week      PT Plan      Co-evaluation              AM-PAC PT "6 Clicks" Mobility   Outcome Measure  Help needed turning from your back to your side while in a flat bed without using bedrails?: A Lot Help needed moving from lying on your back to sitting on the side of a flat bed without using bedrails?: A Lot Help needed moving to and from a bed to a chair (including a wheelchair)?: A Lot Help needed standing up from a chair using your arms (e.g., wheelchair or bedside chair)?: A Lot Help needed to walk in hospital room?: A Lot Help needed climbing 3-5 steps with a railing? : Total 6 Click Score: 11    End of Session Equipment Utilized During Treatment: Oxygen Activity Tolerance: Patient limited by pain Patient left: in bed;with call bell/phone within reach;with bed alarm  set Nurse Communication: Mobility status;Patient requests pain meds PT Visit Diagnosis: Unsteadiness on feet (R26.81);Muscle weakness (generalized) (M62.81)     Time: 4696-2952 PT Time Calculation (min) (ACUTE ONLY): 9 min  Charges:    $Therapeutic Activity: 8-22 mins PT General Charges $$ ACUTE PT VISIT: 1 Visit                   Danielle Dess, PTA 03/23/23, 11:36 AM

## 2023-03-23 NOTE — Progress Notes (Signed)
Nutrition Follow-up  DOCUMENTATION CODES:   Non-severe (moderate) malnutrition in context of chronic illness  INTERVENTION:   -Continue regular diet -Continue Magic cup TID with meals, each supplement provides 290 kcal and 9 grams of protein  -Continue Ensure Enlive po BID, each supplement provides 350 kcal and 20 grams of protein  NUTRITION DIAGNOSIS:   Moderate Malnutrition related to chronic illness as evidenced by moderate fat depletion, moderate muscle depletion.  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  Reviewed I/O's: +480 ml x 24 hours and -102 ml since admission   Pt receiving personal care at time of visit.   Per MD notes, pt does not desire any further cancer work-up.   She remains on a regular diet, within minimal intake. Noted meal completions 0-25%. She is accepting most of her Ensure supplements.   Wt has been stable since admission.   Per TOC notes, plan for facility to re-evaluate her today to determine if she can return to SNF with hospice services.   Medications reviewed and include folic acid and neurontin.  Labs reviewed: CBGS: 88 (inpatient orders for glycemic control are none).    Diet Order:   Diet Order             Diet regular Room service appropriate? Yes; Fluid consistency: Thin  Diet effective now                   EDUCATION NEEDS:   Education needs have been addressed  Skin:  Skin Assessment: Skin Integrity Issues: Skin Integrity Issues:: Stage II Stage II: sacrum  Last BM:   03/22/23  Height:   Ht Readings from Last 1 Encounters:  03/18/23 5\' 1"  (1.549 m)    Weight:   Wt Readings from Last 1 Encounters:  03/23/23 58.8 kg    Ideal Body Weight:  47.7 kg  BMI:  Body mass index is 24.49 kg/m.  Estimated Nutritional Needs:   Kcal:  1400-1600kcal/day  Protein:  70-80g/day  Fluid:  1.2-1.4L/day    Levada Schilling, RD, LDN, CDCES Registered Dietitian II Certified Diabetes Care and Education Specialist Please refer to [facility] for RD and/or RD on-call/weekend/after hours pager

## 2023-03-23 NOTE — Progress Notes (Signed)
I called [phone] and gave a detailed report to Lavell Anchors at [facility].

## 2023-04-20 DEATH — deceased

## 2023-07-05 ENCOUNTER — Encounter: Payer: 59 | Admitting: Family Medicine

## 2023-07-19 ENCOUNTER — Encounter: Payer: 59 | Admitting: Family Medicine
